# Patient Record
Sex: Female | Born: 1975 | Race: Black or African American | Hispanic: No | Marital: Single | Smoking: Former smoker
Health system: Southern US, Community
[De-identification: ages and names within clinical notes are randomized; demographics above are authoritative.]

## PROBLEM LIST (undated history)

## (undated) DIAGNOSIS — I517 Cardiomegaly: Secondary | ICD-10-CM

## (undated) DIAGNOSIS — N189 Chronic kidney disease, unspecified: Secondary | ICD-10-CM

## (undated) DIAGNOSIS — I639 Cerebral infarction, unspecified: Secondary | ICD-10-CM

## (undated) DIAGNOSIS — J189 Pneumonia, unspecified organism: Secondary | ICD-10-CM

## (undated) DIAGNOSIS — I629 Nontraumatic intracranial hemorrhage, unspecified: Secondary | ICD-10-CM

## (undated) DIAGNOSIS — D649 Anemia, unspecified: Secondary | ICD-10-CM

## (undated) DIAGNOSIS — I10 Essential (primary) hypertension: Secondary | ICD-10-CM

## (undated) DIAGNOSIS — Z992 Dependence on renal dialysis: Secondary | ICD-10-CM

## (undated) DIAGNOSIS — I272 Pulmonary hypertension, unspecified: Secondary | ICD-10-CM

## (undated) DIAGNOSIS — I3139 Other pericardial effusion (noninflammatory): Secondary | ICD-10-CM

## (undated) DIAGNOSIS — N186 End stage renal disease: Secondary | ICD-10-CM

## (undated) DIAGNOSIS — F1910 Other psychoactive substance abuse, uncomplicated: Secondary | ICD-10-CM

## (undated) DIAGNOSIS — I509 Heart failure, unspecified: Secondary | ICD-10-CM

## (undated) DIAGNOSIS — D696 Thrombocytopenia, unspecified: Secondary | ICD-10-CM

## (undated) HISTORY — DX: Other pericardial effusion (noninflammatory): I31.39

## (undated) HISTORY — PX: CARDIAC CATHETERIZATION: SHX172

## (undated) HISTORY — DX: End stage renal disease: N18.6

## (undated) HISTORY — DX: Nontraumatic intracranial hemorrhage, unspecified: I62.9

## (undated) HISTORY — DX: Pulmonary hypertension, unspecified: I27.20

## (undated) HISTORY — DX: End stage renal disease: Z99.2

## (undated) HISTORY — DX: Cardiomegaly: I51.7

## (undated) HISTORY — DX: Thrombocytopenia, unspecified: D69.6

---

## 2014-03-08 DIAGNOSIS — I10 Essential (primary) hypertension: Secondary | ICD-10-CM

## 2015-07-12 ENCOUNTER — Emergency Department (HOSPITAL_COMMUNITY)
Admission: EM | Admit: 2015-07-12 | Discharge: 2015-07-12 | Disposition: A | Discharge status: Home / Self Care | Payer: Medicaid Other | Attending: Emergency Medicine | Admitting: Emergency Medicine

## 2015-07-12 ENCOUNTER — Encounter (HOSPITAL_COMMUNITY): Payer: Self-pay | Admitting: Emergency Medicine

## 2015-07-12 DIAGNOSIS — R0600 Dyspnea, unspecified: Secondary | ICD-10-CM | POA: Insufficient documentation

## 2015-07-12 DIAGNOSIS — I4892 Unspecified atrial flutter: Secondary | ICD-10-CM | POA: Insufficient documentation

## 2015-07-12 DIAGNOSIS — F419 Anxiety disorder, unspecified: Secondary | ICD-10-CM | POA: Diagnosis present

## 2015-07-12 DIAGNOSIS — I10 Essential (primary) hypertension: Secondary | ICD-10-CM | POA: Diagnosis not present

## 2015-07-12 DIAGNOSIS — F17200 Nicotine dependence, unspecified, uncomplicated: Secondary | ICD-10-CM | POA: Insufficient documentation

## 2015-07-12 DIAGNOSIS — R002 Palpitations: Secondary | ICD-10-CM

## 2015-07-12 HISTORY — DX: Essential (primary) hypertension: I10

## 2015-07-12 LAB — BASIC METABOLIC PANEL
Anion gap: 7 (ref 5–15)
BUN: 17 mg/dL (ref 6–20)
CO2: 26 mmol/L (ref 22–32)
Calcium: 9.5 mg/dL (ref 8.9–10.3)
Chloride: 110 mmol/L (ref 101–111)
Creatinine, Ser: 1.18 mg/dL — ABNORMAL HIGH (ref 0.44–1.00)
GFR calc Af Amer: >60 mL/min (ref >60)
GFR calc non Af Amer: 58 mL/min — ABNORMAL LOW (ref >60)
Glucose, Bld: 105 mg/dL — ABNORMAL HIGH (ref 65–99)
Potassium: 3.8 mmol/L (ref 3.5–5.1)
Sodium: 143 mmol/L (ref 135–145)

## 2015-07-12 LAB — CBC WITH DIFFERENTIAL/PLATELET
Basophils Absolute: 0 10*3/uL (ref 0.0–0.1)
Basophils Relative: 1 %
Eosinophils Absolute: 0.2 10*3/uL (ref 0.0–0.7)
Eosinophils Relative: 3 %
HCT: 41.9 % (ref 36.0–46.0)
Hemoglobin: 13.1 g/dL (ref 12.0–15.0)
Lymphocytes Relative: 43 %
Lymphs Abs: 2.7 10*3/uL (ref 0.7–4.0)
MCH: 24.3 pg — ABNORMAL LOW (ref 26.0–34.0)
MCHC: 31.3 g/dL (ref 30.0–36.0)
MCV: 77.6 fL — ABNORMAL LOW (ref 78.0–100.0)
Monocytes Absolute: 0.4 10*3/uL (ref 0.1–1.0)
Monocytes Relative: 6 %
Neutro Abs: 3.1 10*3/uL (ref 1.7–7.7)
Neutrophils Relative %: 49 %
Platelets: 268 10*3/uL (ref 150–400)
RBC: 5.4 MIL/uL — ABNORMAL HIGH (ref 3.87–5.11)
RDW: 16.1 % — ABNORMAL HIGH (ref 11.5–15.5)
WBC: 6.5 10*3/uL (ref 4.0–10.5)

## 2015-07-12 LAB — TSH: TSH: 1.55 u[IU]/mL (ref 0.350–4.500)

## 2015-07-12 LAB — TROPONIN I: Troponin I: <0.03 ng/mL (ref <0.031)

## 2015-07-12 LAB — MAGNESIUM: Magnesium: 2.2 mg/dL (ref 1.7–2.4)

## 2015-07-12 NOTE — ED Notes (Signed)
Bed: WA01 Expected date:  Expected time:  Means of arrival:  Comments: Hold for triage 

## 2015-07-12 NOTE — Discharge Instructions (Signed)
Atrial Flutter Atrial flutter is a heart rhythm that can cause the heart to beat very fast (tachycardia). It originates in the upper chambers of the heart (atria). In atrial flutter, the top chambers of the heart (atria) often beat much faster than the bottom chambers of the heart (ventricles). Atrial flutter has a regular "saw toothed" appearance in an EKG readout. An EKG is a test that records the electrical activity of the heart. Atrial flutter can cause the heart to beat up to 150 beats per minute (BPM). Atrial flutter can either be short lived (paroxysmal) or permanent.  CAUSES  Causes of atrial flutter can be many. Some of these include:  Heart related issues:  Heart attack (myocardial infarction).  Heart failure.  Heart valve problems.  Poorly controlled high blood pressure (hypertension).  After open heart surgery.  Lung related issues:  A blood clot in the lungs (pulmonary embolism).  Chronic obstructive pulmonary disease (COPD). Medications used to treat COPD can attribute to atrial flutter.  Other related causes:  Hyperthyroidism.  Caffeine.  Some decongestant cold medications.  Low electrolyte levels such as potassium or magnesium.  Cocaine. SYMPTOMS  An awareness of your heart beating rapidly (palpitations).  Shortness of breath.  Chest pain.  Low blood pressure (hypotension).  Dizziness or fainting. DIAGNOSIS  Different tests can be performed to diagnose atrial flutter.   An EKG.  Holter monitor. This is a 24-hour recording of your heart rhythm. You will also be given a diary. Write down all symptoms that you have and what you were doing at the time you experienced symptoms.  Cardiac event monitor. This small device can be worn for up to 30 days. When you have heart symptoms, you will push a button on the device. This will then record your heart rhythm.  Echocardiogram. This is an imaging test to look at your heart. Your caregiver will look at your  heart valves and the ventricles.  Stress test. This test can help determine if the atrial flutter is related to exercise or if coronary artery disease is present.  Laboratory studies will look at certain blood levels like:  Complete blood count (CBC).  Potassium.  Magnesium.  Thyroid function. TREATMENT  Treatment of atrial flutter varies. A combination of therapies may be used or sometimes atrial flutter may need only 1 type of treatment.  Lab work: If your blood work, such as your electrolytes (potassium, magnesium) or your thyroid function tests, are abnormal, your caregiver will treat them accordingly.  Medication:  There are several different types of medications that can convert your heart to a normal rhythm and prevent atrial flutter from reoccurring.  Nonsurgical procedures: Nonsurgical techniques may be used to control atrial flutter. Some examples include:  Cardioversion. This technique uses either drugs or an electrical shock to restore a normal heart rhythm:  Cardioversion drugs may be given through an intravenous (IV) line to help "reset" the heart rhythm.  In electrical cardioversion, your caregiver shocks your heart with electrical energy. This helps to reset the heartbeat to a normal rhythm.  Ablation. If atrial flutter is a persistent problem, an ablation may be needed. This procedure is done under mild sedation. High frequency radio-wave energy is used to destroy the area of heart tissue responsible for atrial flutter. SEEK IMMEDIATE MEDICAL CARE IF:  You have:  Dizziness.  Near fainting or fainting.  Shortness of breath.  Chest pain or pressure.  Sudden nausea or vomiting.  Profuse sweating. If you have the above symptoms,  call your local emergency service immediately! Do not drive yourself to the hospital. MAKE SURE YOU:   Understand these instructions.  Palpitations A palpitation is the feeling that your heartbeat is irregular or is faster than  normal. It may feel like your heart is fluttering or skipping a beat. Palpitations are usually not a serious problem. However, in some cases, you may need further medical evaluation. CAUSES  Palpitations can be caused by:  Smoking.  Caffeine or other stimulants, such as diet pills or energy drinks.  Alcohol.  Stress and anxiety.  Strenuous physical activity.  Fatigue.  Certain medicines.  Heart disease, especially if you have a history of irregular heart rhythms (arrhythmias), such as atrial fibrillation, atrial flutter, or supraventricular tachycardia.  An improperly working pacemaker or defibrillator. DIAGNOSIS  To find the cause of your palpitations, your health care provider will take your medical history and perform a physical exam. Your health care provider may also have you take a test called an ambulatory electrocardiogram (ECG). An ECG records your heartbeat patterns over a 24-hour period. You may also have other tests, such as:  Transthoracic echocardiogram (TTE). During echocardiography, sound waves are used to evaluate how blood flows through your heart.  Transesophageal echocardiogram (TEE).  Cardiac monitoring. This allows your health care provider to monitor your heart rate and rhythm in real time.  Holter monitor. This is a portable device that records your heartbeat and can help diagnose heart arrhythmias. It allows your health care provider to track your heart activity for several days, if needed.  Stress tests by exercise or by giving medicine that makes the heart beat faster. TREATMENT  Treatment of palpitations depends on the cause of your symptoms and can vary greatly. Most cases of palpitations do not require any treatment other than time, relaxation, and monitoring your symptoms. Other causes, such as atrial fibrillation, atrial flutter, or supraventricular tachycardia, usually require further treatment. HOME CARE INSTRUCTIONS   Avoid:  Caffeinated  coffee, tea, soft drinks, diet pills, and energy drinks.  Chocolate.  Alcohol.  Stop smoking if you smoke.  Reduce your stress and anxiety. Things that can help you relax include:  A method of controlling things in your body, such as your heartbeats, with your mind (biofeedback).  Yoga.  Meditation.  Physical activity such as swimming, jogging, or walking.  Get plenty of rest and sleep. SEEK MEDICAL CARE IF:   You continue to have a fast or irregular heartbeat beyond 24 hours.  Your palpitations occur more often. SEEK IMMEDIATE MEDICAL CARE IF:  You have chest pain or shortness of breath.  You have a severe headache.  You feel dizzy or you faint. MAKE SURE YOU:  Understand these instructions.  Will watch your condition.  Will get help right away if you are not doing well or get worse.   This information is not intended to replace advice given to you by your health care provider. Make sure you discuss any questions you have with your health care provider.   Document Released: 05/30/2000 Document Revised: 06/07/2013 Document Reviewed: 08/01/2011 Elsevier Interactive Patient Education [employer].

## 2015-07-12 NOTE — ED Notes (Signed)
Per pt, states she was having a panic attack at work today-here for eval-also complaining of SOB

## 2015-07-12 NOTE — ED Provider Notes (Signed)
CSN: [id number]     Arrival date & time 07/12/15  1652 History   First MD Initiated Contact with Patient 07/12/15 1803     Chief Complaint  Patient presents with  . Panic Attack     (Consider location/radiation/quality/duration/timing/severity/associated sxs/prior Treatment) HPI   40yF with "anxiety." Onset shortly before arrival while at work. No obvious precipitant. Mild dyspnea. Denies pain. On my initial evaluation had HR of 140 on monitor while no variation. Denies hx of afib/flutter or svt. Reports similar symptoms previously. She reports maybe 4-5 times in past 3 years. Previous times the symptoms subsided after a few minutes.   Past Medical History  Diagnosis Date  . Hypertension    History reviewed. No pertinent past surgical history. No family history on file. Social History  Substance Use Topics  . Smoking status: Current Every Day Smoker  . Smokeless tobacco: None  . Alcohol Use: Yes   OB History    No data available     Review of Systems  All systems reviewed and negative, other than as noted in HPI.   Allergies  Visine  Home Medications   Prior to Admission medications   Medication Sig Start Date End Date Taking? Authorizing Provider  ibuprofen (ADVIL,MOTRIN) 200 MG tablet Take 400 mg by mouth every 4 (four) hours as needed for moderate pain or cramping.   Yes Historical Provider, MD   BP 183/131 mmHg  Pulse 95  Temp(Src) 98.4 F (36.9 C) (Oral)  Resp 21  SpO2 99%  LMP 06/22/2015 Physical Exam  Constitutional: She appears well-developed and well-nourished. No distress.  HENT:  Head: Normocephalic and atraumatic.  Eyes: Conjunctivae are normal. Right eye exhibits no discharge. Left eye exhibits no discharge.  Neck: Neck supple.  Cardiovascular: Regular rhythm and normal heart sounds.  Exam reveals no gallop and no friction rub.   No murmur heard. Tachy. Regular.  Pulmonary/Chest: Effort normal and breath sounds normal. No respiratory distress.   Abdominal: Soft. She exhibits no distension. There is no tenderness.  Musculoskeletal: She exhibits no edema or tenderness.  Lower extremities symmetric as compared to each other. No calf tenderness. Negative Homan's. No palpable cords.   Neurological: She is alert.  Skin: Skin is warm and dry.  Psychiatric: She has a normal mood and affect. Her behavior is normal. Thought content normal.  Nursing note and vitals reviewed.   ED Course  Procedures (including critical care time) Labs Review Labs Reviewed  CBC WITH DIFFERENTIAL/PLATELET - Abnormal; Notable for the following:    RBC 5.40 (*)    MCV 77.6 (*)    MCH 24.3 (*)    RDW 16.1 (*)    All other components within normal limits  BASIC METABOLIC PANEL - Abnormal; Notable for the following:    Glucose, Bld 105 (*)    Creatinine, Ser 1.18 (*)    GFR calc non Af Amer 58 (*)    All other components within normal limits  TROPONIN I  TSH  MAGNESIUM    Imaging Review No results found. I have personally reviewed and evaluated these images and lab results as part of my medical decision-making.   EKG Interpretation   Date/Time:  Thursday July 12 2015 17:47:16 EST Ventricular Rate:  140 PR Interval:  246 QRS Duration: 69 QT Interval:  291 QTC Calculation: 444 R Axis:   64 Text Interpretation:  Atrial Flutter? with 2:1 A-V conduction No old  tracing to compare Confirmed by [place]  MD, [place] [phone]) on 07/12/2015  8:41:39 PM      MDM   Final diagnoses:  Atrial flutter, unspecified type ([place])    40 year old female with palpitations. Suspect initial EKG may have been atrial flutter with 2-1 conduction. Spontaneous conversion. Resolution of symptoms with this. Patient reports similar symptoms intermittently for the last couple years. Has never had formal evaluation of it. Based workup today is unremarkable. She was noted to be hypertensive though. Reports previous history of hypertension during pregnancy. Is not currently  on medications.   Now in sinus. No complaints. W/u fairly unremarkable. Needs outpt FU. Discussed need to establish primary care and to discuss symptoms and have BP rechecked.    Virgel Manifold, MD 07/18/15 1136

## 2015-07-12 NOTE — ED Notes (Signed)
[place], MD notified of current blood pressure of 184/141. Pt ok'd to be discharged by Wilson Singer, MD.

## 2016-06-17 ENCOUNTER — Encounter (HOSPITAL_COMMUNITY): Payer: Self-pay | Admitting: *Deleted

## 2016-06-17 ENCOUNTER — Ambulatory Visit (HOSPITAL_COMMUNITY)
Admission: EM | Admit: 2016-06-17 | Discharge: 2016-06-17 | Disposition: A | Discharge status: Home / Self Care | Payer: Medicaid Other | Attending: Family Medicine | Admitting: Family Medicine

## 2016-06-17 ENCOUNTER — Emergency Department (HOSPITAL_COMMUNITY)
Admission: AD | Admit: 2016-06-17 | Discharge: 2016-06-17 | Disposition: A | Discharge status: Home / Self Care | Payer: Medicaid Other | Source: Ambulatory Visit | Attending: Emergency Medicine | Admitting: Emergency Medicine

## 2016-06-17 ENCOUNTER — Encounter (HOSPITAL_COMMUNITY): Payer: Self-pay | Admitting: Family Medicine

## 2016-06-17 DIAGNOSIS — Z3201 Encounter for pregnancy test, result positive: Secondary | ICD-10-CM | POA: Diagnosis not present

## 2016-06-17 DIAGNOSIS — Z9119 Patient's noncompliance with other medical treatment and regimen: Secondary | ICD-10-CM | POA: Diagnosis not present

## 2016-06-17 DIAGNOSIS — Z91199 Patient's noncompliance with other medical treatment and regimen due to unspecified reason: Secondary | ICD-10-CM

## 2016-06-17 DIAGNOSIS — K5901 Slow transit constipation: Secondary | ICD-10-CM | POA: Diagnosis not present

## 2016-06-17 DIAGNOSIS — F17210 Nicotine dependence, cigarettes, uncomplicated: Secondary | ICD-10-CM | POA: Insufficient documentation

## 2016-06-17 DIAGNOSIS — O99331 Smoking (tobacco) complicating pregnancy, first trimester: Secondary | ICD-10-CM | POA: Diagnosis not present

## 2016-06-17 DIAGNOSIS — O10011 Pre-existing essential hypertension complicating pregnancy, first trimester: Secondary | ICD-10-CM

## 2016-06-17 DIAGNOSIS — Z720 Tobacco use: Secondary | ICD-10-CM

## 2016-06-17 DIAGNOSIS — O10919 Unspecified pre-existing hypertension complicating pregnancy, unspecified trimester: Secondary | ICD-10-CM

## 2016-06-17 DIAGNOSIS — O0991 Supervision of high risk pregnancy, unspecified, first trimester: Secondary | ICD-10-CM

## 2016-06-17 DIAGNOSIS — Z79899 Other long term (current) drug therapy: Secondary | ICD-10-CM | POA: Diagnosis not present

## 2016-06-17 DIAGNOSIS — Z3A01 Less than 8 weeks gestation of pregnancy: Secondary | ICD-10-CM | POA: Insufficient documentation

## 2016-06-17 DIAGNOSIS — I160 Hypertensive urgency: Secondary | ICD-10-CM

## 2016-06-17 LAB — URINALYSIS, ROUTINE W REFLEX MICROSCOPIC
Bilirubin Urine: NEGATIVE
GLUCOSE, UA: 50 mg/dL — AB
Hgb urine dipstick: NEGATIVE
KETONES UR: NEGATIVE mg/dL
Leukocytes, UA: NEGATIVE
NITRITE: NEGATIVE
PH: 7 (ref 5.0–8.0)
Protein, ur: 30 mg/dL — AB
Specific Gravity, Urine: 1.014 (ref 1.005–1.030)

## 2016-06-17 LAB — POCT URINALYSIS DIP (DEVICE)
BILIRUBIN URINE: NEGATIVE
GLUCOSE, UA: NEGATIVE mg/dL
KETONES UR: NEGATIVE mg/dL
LEUKOCYTES UA: NEGATIVE
Nitrite: NEGATIVE
Protein, ur: 30 mg/dL — AB
SPECIFIC GRAVITY, URINE: 1.025 (ref 1.005–1.030)
UROBILINOGEN UA: 0.2 mg/dL (ref 0.0–1.0)
pH: 6.5 (ref 5.0–8.0)

## 2016-06-17 LAB — CBC WITH DIFFERENTIAL/PLATELET
BASOS PCT: 0 %
Basophils Absolute: 0 10*3/uL (ref 0.0–0.1)
EOS ABS: 0.1 10*3/uL (ref 0.0–0.7)
Eosinophils Relative: 1 %
HEMATOCRIT: 34.6 % — AB (ref 36.0–46.0)
HEMOGLOBIN: 10.4 g/dL — AB (ref 12.0–15.0)
LYMPHS PCT: 22 %
Lymphs Abs: 1.7 10*3/uL (ref 0.7–4.0)
MCH: 20.6 pg — AB (ref 26.0–34.0)
MCHC: 30.1 g/dL (ref 30.0–36.0)
MCV: 68.5 fL — ABNORMAL LOW (ref 78.0–100.0)
MONOS PCT: 5 %
Monocytes Absolute: 0.4 10*3/uL (ref 0.1–1.0)
NEUTROS ABS: 5.4 10*3/uL (ref 1.7–7.7)
Neutrophils Relative %: 72 %
Platelets: 228 10*3/uL (ref 150–400)
RBC: 5.05 MIL/uL (ref 3.87–5.11)
RDW: 17.7 % — ABNORMAL HIGH (ref 11.5–15.5)
WBC: 7.6 10*3/uL (ref 4.0–10.5)

## 2016-06-17 LAB — BASIC METABOLIC PANEL
Anion gap: 9 (ref 5–15)
BUN: 8 mg/dL (ref 6–20)
CHLORIDE: 105 mmol/L (ref 101–111)
CO2: 18 mmol/L — AB (ref 22–32)
CREATININE: 0.72 mg/dL (ref 0.44–1.00)
Calcium: 8.9 mg/dL (ref 8.9–10.3)
GFR calc Af Amer: >60 mL/min (ref >60)
GFR calc non Af Amer: >60 mL/min (ref >60)
GLUCOSE: 88 mg/dL (ref 65–99)
Potassium: 3.4 mmol/L — ABNORMAL LOW (ref 3.5–5.1)
Sodium: 132 mmol/L — ABNORMAL LOW (ref 135–145)

## 2016-06-17 LAB — POCT PREGNANCY, URINE: Preg Test, Ur: POSITIVE — AB

## 2016-06-17 MED ORDER — SODIUM CHLORIDE 0.9 % IV SOLN
INTRAVENOUS | Status: DC
  Administered 2016-06-17: 14:00:00 via INTRAVENOUS

## 2016-06-17 MED ORDER — LABETALOL HCL 200 MG PO TABS
400.0000 mg | ORAL_TABLET | Freq: Once | ORAL | Status: AC
  Administered 2016-06-17: 400 mg via ORAL
  Filled 2016-06-17: qty 2

## 2016-06-17 MED ORDER — LABETALOL HCL 200 MG PO TABS: 400.0000 mg | ORAL_TABLET | Freq: Three times a day (TID) | ORAL | 2 refills | Status: DC

## 2016-06-17 MED ORDER — HYDRALAZINE HCL 20 MG/ML IJ SOLN
10.0000 mg | Freq: Once | INTRAMUSCULAR | Status: AC
  Administered 2016-06-17: 10 mg via INTRAVENOUS
  Filled 2016-06-17: qty 1

## 2016-06-17 NOTE — ED Triage Notes (Signed)
Pt here for abnormal BM and constipation x 2 weeks. sts she is also late on her period and feels very bloated.

## 2016-06-17 NOTE — Discharge Instructions (Signed)
Miralax for constipation. Go to [facility] hospital now.

## 2016-06-17 NOTE — MAU Note (Signed)
Ran out of BP meds about 3 wks ago.  Didn't have money for co-pay for doc and meds.  States has never been this high before. Has been having headaches.

## 2016-06-17 NOTE — Discharge Instructions (Signed)
You are started on new blood pressure medication, labetalol. Please take this medication as prescribed.  Please follow-up with OB, listed above for ongoing management of your pregnancy and blood pressure management.  You can take over the counter Miralax (polyethylene glycol) for your constipation. Develop one capful of taste as powder into any drink or soft food and take once daily.  Return without fail for worsening symptoms, including confusion, new vision or speech changes, vaginal bleeding or severe abdominal pain, chest pain, difficulty breathing, or any other symptoms concerning to you.

## 2016-06-17 NOTE — ED Notes (Signed)
[facility] Per [facility], pt with hx of HTN but has been out of her clonidine x 1 mo. Per EMS, pt went to urgent care to refill her medication and was found to be [redacted] weeks pregnant. 220/130, HR 100, 100% on RA. Per EMS, pt given 10 mg hydralazine IV.

## 2016-06-17 NOTE — ED Provider Notes (Signed)
CSN: [id number]     Arrival date & time 06/17/16  1118 History   First MD Initiated Contact with Patient 06/17/16 1242     Chief Complaint  Patient presents with  . Constipation   (Consider location/radiation/quality/duration/timing/severity/associated sxs/prior Treatment) 41 year old obese female states that she has not had a bowel movement in 7-10 days and feels bloated in the abdomen. Denies abdominal pain. She has taken no OTC medications.  She is also concerned about her blood pressure which is elevated because she has not refilled her medications. She was told that she would have to see her PCP however the patient states she did not have $3 co-pay to see her doctor.  Denies chest pain, shortness of breath, edema or neurologic symptoms.      Past Medical History:  Diagnosis Date  . Hypertension    History reviewed. No pertinent surgical history. History reviewed. No pertinent family history. Social History  Substance Use Topics  . Smoking status: Current Every Day Smoker  . Smokeless tobacco: Never Used  . Alcohol use Yes   OB History    No data available     Review of Systems  Constitutional: Negative.   HENT: Negative.   Respiratory: Negative for cough and shortness of breath.   Cardiovascular: Negative for chest pain and leg swelling.  Gastrointestinal: Positive for constipation. Negative for abdominal pain.  Genitourinary: Negative.   Musculoskeletal: Negative.   Skin: Negative.   Neurological: Negative for seizures, syncope, speech difficulty, numbness and headaches.  All other systems reviewed and are negative.   Allergies  Visine [tetrahydrozoline hcl]  Home Medications   Prior to Admission medications   Medication Sig Start Date End Date Taking? Authorizing Provider  cloNIDine (CATAPRES) 0.2 MG tablet Take 0.2 mg by mouth 2 (two) times daily.   Yes Historical Provider, MD  metoprolol succinate (TOPROL-XL) 50 MG 24 hr tablet Take 50 mg by mouth daily.  Take with or immediately following a meal.   Yes Historical Provider, MD  ibuprofen (ADVIL,MOTRIN) 200 MG tablet Take 400 mg by mouth every 4 (four) hours as needed for moderate pain or cramping.    Historical Provider, MD   Meds Ordered and Administered this Visit  Medications - No data to display  BP (!) 220/136   Pulse 91   Temp 98.1 F (36.7 C)   Resp 18   LMP 04/30/2016   SpO2 100%  No data found.   Physical Exam  Urgent Care Course   Clinical Course     Procedures (including critical care time)  Labs Review Labs Reviewed  POCT URINALYSIS DIP (DEVICE) - Abnormal; Notable for the following:       Result Value   Hgb urine dipstick TRACE (*)    Protein, ur 30 (*)    All other components within normal limits  POCT PREGNANCY, URINE - Abnormal; Notable for the following:    Preg Test, Ur POSITIVE (*)    All other components within normal limits    Imaging Review No results found.   Visual Acuity Review  Right Eye Distance:   Left Eye Distance:   Bilateral Distance:    Right Eye Near:   Left Eye Near:    Bilateral Near:         MDM   1. Slow transit constipation   2. Positive urine pregnancy test   3. Pre-existing essential hypertension during pregnancy in first trimester   4. High-risk pregnancy in first trimester   5. Tobacco abuse  disorder   6. Personal history of noncompliance with medical treatment    To go directly to [facility] for evaluation now because of positive pregnancy to test, first trimester, high risk pregnancy, hypertension and medical noncompliance. Her chief complaint was constipation.    Janne Napoleon, NP 06/17/16 1324

## 2016-06-17 NOTE — MAU Provider Note (Cosign Needed)
History     CSN: [id number]  Arrival date and time: 06/17/16 1346   None     Chief Complaint  Patient presents with  . Hypertension   HPI  [name] is a 41 y.o. female [phone] @ [redacted]w[redacted]d here in MAU with concerns about her BP. She was seen at the Urgent Care today at [facility] and was noted to have severely elevated BP. The patient says she ran out of her BP medication 3 weeks ago and has not been able to afford the co-pay to have it refilled. The patient was sent here to [facility] for further management.  She is without chest pain.   She denies vaginal bleeding or abdominal pain.   OB History    Gravida Para Term Preterm AB Living   8 5 5   2 5    SAB TAB Ectopic Multiple Live Births     2     5      Past Medical History:  Diagnosis Date  . Hypertension     No past surgical history on file.  No family history on file.  Social History  Substance Use Topics  . Smoking status: Current Every Day Smoker  . Smokeless tobacco: Never Used  . Alcohol use Yes    Allergies:  Allergies  Allergen Reactions  . Visine [Tetrahydrozoline Hcl] Other (See Comments)    Eyes Swelling.    Prescriptions Prior to Admission  Medication Sig Dispense Refill Last Dose  . cloNIDine (CATAPRES) 0.2 MG tablet Take 0.2 mg by mouth 2 (two) times daily.     Marland Kitchen ibuprofen (ADVIL,MOTRIN) 200 MG tablet Take 400 mg by mouth every 4 (four) hours as needed for moderate pain or cramping.   Past Month at Unknown time  . metoprolol succinate (TOPROL-XL) 50 MG 24 hr tablet Take 50 mg by mouth daily. Take with or immediately following a meal.      Results for orders placed or performed during the hospital encounter of 06/17/16 (from the past 48 hour(s))  Urinalysis, Routine w reflex microscopic     Status: Abnormal   Collection Time: 06/17/16  2:53 PM  Result Value Ref Range   Color, Urine YELLOW YELLOW   APPearance CLEAR CLEAR   Specific Gravity, Urine 1.014 1.005 - 1.030   pH 7.0 5.0 - 8.0   Glucose,  UA 50 (A) NEGATIVE mg/dL   Hgb urine dipstick NEGATIVE NEGATIVE   Bilirubin Urine NEGATIVE NEGATIVE   Ketones, ur NEGATIVE NEGATIVE mg/dL   Protein, ur 30 (A) NEGATIVE mg/dL   Nitrite NEGATIVE NEGATIVE   Leukocytes, UA NEGATIVE NEGATIVE   RBC / HPF 0-5 0 - 5 RBC/hpf   WBC, UA 0-5 0 - 5 WBC/hpf   Bacteria, UA RARE (A) NONE SEEN   Squamous Epithelial / LPF 0-5 (A) NONE SEEN   Mucous PRESENT    Hyaline Casts, UA PRESENT    Review of Systems  Constitutional: Negative for chills and fever.  Cardiovascular: Negative for chest pain.  Gastrointestinal: Negative for abdominal pain.  Neurological: Positive for headaches.   Physical Exam   Blood pressure (!) 224/151, pulse 97, temperature 98.5 F (36.9 C), temperature source Oral, resp. rate 18, height 5' 1.25" (1.556 m), weight 180 lb 6.4 oz (81.8 kg), last menstrual period 05/03/2016, SpO2 100 %.  Physical Exam  Constitutional: She is oriented to person, place, and time. She appears well-developed and well-nourished. No distress.  HENT:  Head: Normocephalic.  Cardiovascular: Tachycardia present.   Musculoskeletal: Normal  range of motion.  Neurological: She is alert and oriented to person, place, and time.  Skin: Skin is warm. She is not diaphoretic.  Psychiatric: Her behavior is normal.   MAU Course  Procedures  None  MDM  Patient sent from Urgent care with severely elevated BP readings. Discussed patient with [name] Dr. Lorretta Harp. Will transfer patient via carelink to [name] for treatment of BP. Patient is without pregnancy complaints. Referral message sent to the [facility] for scheduling of high risk OB appointment.  - NS  - Hydralazine 10 mg IV X 1 dose.   Assessment and Plan   A:  1. Hypertensive crisis, unspecified   2. Chronic hypertension affecting pregnancy     P:  Transfer to [name] via [place], NP 06/17/2016 4:20 PM

## 2016-06-17 NOTE — ED Notes (Signed)
ED Provider at bedside. 

## 2016-06-17 NOTE — ED Notes (Signed)
Case management at bedside.

## 2016-06-17 NOTE — ED Provider Notes (Signed)
[place] DEPT Provider Note   CSN: [id number] Arrival date & time: 06/17/16  1346     History   Chief Complaint Chief Complaint  Patient presents with  . Hypertension    HPI Madeline Brown is a 41 y.o. female.  HPI 41 year old female who presents with elevated BPs. History of HTN and obesity. Reports that she normally takes clonidine for her hypertension, but ran out of this medication 3-5 weeks ago. Has had some mild intermittent headaches since that time. Was unable to refill her medications due to inability to afford co-pay to see her primary care doctor in 4 refill on her prescription. States that over the past 2 weeks she has been more constipated, straining more to have a bowel movement. Last bowel movement yesterday. Without abdominal pain, nausea or vomiting, fevers or chills. Was seen at urgent care today for constipation and found to be incidentally pregnant. Was subsequently sent to [facility]. Likely about 6 weeks and 3 days gestational age. No vaginal bleeding or abdominal pain. She was subsequently sent from [facility] to [facility] ED for evaluation of her elevated blood pressure 200s over 100s. No confusion, vomiting, focal numbness or weakness, vision or speech changes, chest pain, difficulty breathing, decreased urine.   Past Medical History:  Diagnosis Date  . Hypertension     There are no active problems to display for this patient.   Past Surgical History:  Procedure Laterality Date  . CESAREAN SECTION     x2    OB History    Gravida Para Term Preterm AB Living   8 5 5   2 5    SAB TAB Ectopic Multiple Live Births     2     5       Home Medications    Prior to Admission medications   Medication Sig Start Date End Date Taking? Authorizing Provider  Aspirin-Acetaminophen-Caffeine (GOODY HEADACHE PO) Take 1 packet by mouth daily as needed. For cramping   Yes Historical Provider, MD  cloNIDine (CATAPRES) 0.2 MG tablet Take 0.2 mg by  mouth 2 (two) times daily.    Historical Provider, MD  labetalol (NORMODYNE) 200 MG tablet Take 2 tablets (400 mg total) by mouth 3 (three) times daily. 06/17/16   Forde Dandy, MD    Family History Family History  Problem Relation Age of Onset  . Diabetes Father   . Hypertension Father   . Cancer Father     Social History Social History  Substance Use Topics  . Smoking status: Current Every Day Smoker    Packs/day: 0.25    Years: 15.00    Types: Cigarettes  . Smokeless tobacco: Never Used  . Alcohol use Yes     Comment: occassional beer     Allergies   Visine [tetrahydrozoline hcl]   Review of Systems Review of Systems 10/14 systems reviewed and are negative other than those stated in the HPI   Physical Exam Updated Vital Signs BP (!) 166/102   Pulse 96   Temp 99 F (37.2 C) (Oral)   Resp 18   Ht 5' 1.25" (1.556 m)   Wt 180 lb 6.4 oz (81.8 kg)   LMP 05/03/2016   SpO2 99%   BMI 33.81 kg/m   Physical Exam Physical Exam  Nursing note and vitals reviewed. Constitutional: Well developed, well nourished, non-toxic, and in no acute distress Head: Normocephalic and atraumatic.  Mouth/Throat: Oropharynx is clear and moist.  Neck: Normal range of motion.  Neck supple.  Cardiovascular: Normal rate and regular rhythm.   Pulmonary/Chest: Effort normal and breath sounds normal.  Abdominal: Soft. Mildly distended. There is no tenderness. There is no rebound and no guarding.  Musculoskeletal: Normal range of motion.  Neurological: Alert, no facial droop, fluent speech, moves all extremities symmetrically Skin: Skin is warm and dry.  Psychiatric: Cooperative   ED Treatments / Results  Labs (all labs ordered are listed, but only abnormal results are displayed) Labs Reviewed  URINALYSIS, ROUTINE W REFLEX MICROSCOPIC - Abnormal; Notable for the following:       Result Value   Glucose, UA 50 (*)    Protein, ur 30 (*)    Bacteria, UA RARE (*)    Squamous Epithelial /  LPF 0-5 (*)    All other components within normal limits  CBC WITH DIFFERENTIAL/PLATELET - Abnormal; Notable for the following:    Hemoglobin 10.4 (*)    HCT 34.6 (*)    MCV 68.5 (*)    MCH 20.6 (*)    RDW 17.7 (*)    All other components within normal limits  BASIC METABOLIC PANEL - Abnormal; Notable for the following:    Sodium 132 (*)    Potassium 3.4 (*)    CO2 18 (*)    All other components within normal limits  URINE CULTURE    EKG  EKG Interpretation None       Radiology No results found.  Procedures Procedures (including critical care time)  Medications Ordered in ED Medications  0.9 %  sodium chloride infusion ( Intravenous New Bag/Given 06/17/16 1420)  hydrALAZINE (APRESOLINE) injection 10 mg (10 mg Intravenous Given 06/17/16 1427)  labetalol (NORMODYNE) tablet 400 mg (400 mg Oral Given 06/17/16 1618)     Initial Impression / Assessment and Plan / ED Course  I have reviewed the triage vital signs and the nursing notes.  Pertinent labs & imaging results that were available during my care of the patient were reviewed by me and considered in my medical decision making (see chart for details).  Clinical Course     41 year old female who presents with elevated blood pressures and incidental first trimester pregnancy at 6 weeks and 3 days. Is hypertensive with systolic blood pressures in the 190s to 200s over 110s. Currently she is asymptomatic. No abdominal pain and no vaginal bleeding. Discussed with OB on-call physician at [facility] discontinuation of her clonidine, and starting labetalol 400 mg 3 times a day and they will continue to monitor and manage her blood pressures as an outpatient in the [facility]. First dose is initiated here in the emergency department and on follow-up blood pressure 166/102.  Case management has evaluated patient regarding prescription management and payment options. Referral given to women's clinic. Strict return  and follow-up instructions reviewed. She expressed understanding of all discharge instructions and felt comfortable with the plan of care.   Final Clinical Impressions(s) / ED Diagnoses   Final diagnoses:  Chronic hypertension affecting pregnancy  Hypertensive urgency    New Prescriptions New Prescriptions   LABETALOL (NORMODYNE) 200 MG TABLET    Take 2 tablets (400 mg total) by mouth 3 (three) times daily.     Forde Dandy, MD 06/17/16 [phone]

## 2016-07-02 ENCOUNTER — Encounter: Payer: Medicaid Other | Admitting: Family Medicine

## 2016-07-07 ENCOUNTER — Encounter: Payer: Self-pay | Admitting: Obstetrics and Gynecology

## 2016-07-07 ENCOUNTER — Encounter: Payer: Medicaid Other | Admitting: Obstetrics and Gynecology

## 2016-07-07 NOTE — Progress Notes (Signed)
Patient did not keep New OB Referral appointment for 07/07/2016.  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

## 2017-02-12 ENCOUNTER — Emergency Department (HOSPITAL_COMMUNITY): Payer: Medicaid Other

## 2017-02-12 ENCOUNTER — Inpatient Hospital Stay (HOSPITAL_COMMUNITY)
Admission: EM | Admit: 2017-02-12 | Discharge: 2017-02-19 | DRG: 682 | Disposition: A | Discharge status: Home / Self Care | Payer: Medicaid Other | Attending: Family Medicine | Admitting: Family Medicine

## 2017-02-12 ENCOUNTER — Encounter (HOSPITAL_COMMUNITY): Payer: Self-pay | Admitting: Emergency Medicine

## 2017-02-12 ENCOUNTER — Inpatient Hospital Stay (HOSPITAL_COMMUNITY): Payer: Medicaid Other

## 2017-02-12 DIAGNOSIS — N200 Calculus of kidney: Secondary | ICD-10-CM | POA: Diagnosis present

## 2017-02-12 DIAGNOSIS — Z7982 Long term (current) use of aspirin: Secondary | ICD-10-CM | POA: Diagnosis not present

## 2017-02-12 DIAGNOSIS — E871 Hypo-osmolality and hyponatremia: Secondary | ICD-10-CM

## 2017-02-12 DIAGNOSIS — M311 Thrombotic microangiopathy: Secondary | ICD-10-CM | POA: Diagnosis present

## 2017-02-12 DIAGNOSIS — N179 Acute kidney failure, unspecified: Secondary | ICD-10-CM | POA: Diagnosis present

## 2017-02-12 DIAGNOSIS — R001 Bradycardia, unspecified: Secondary | ICD-10-CM | POA: Diagnosis not present

## 2017-02-12 DIAGNOSIS — D259 Leiomyoma of uterus, unspecified: Secondary | ICD-10-CM | POA: Diagnosis present

## 2017-02-12 DIAGNOSIS — N390 Urinary tract infection, site not specified: Secondary | ICD-10-CM | POA: Diagnosis present

## 2017-02-12 DIAGNOSIS — F17210 Nicotine dependence, cigarettes, uncomplicated: Secondary | ICD-10-CM | POA: Diagnosis present

## 2017-02-12 DIAGNOSIS — E876 Hypokalemia: Secondary | ICD-10-CM

## 2017-02-12 DIAGNOSIS — R000 Tachycardia, unspecified: Secondary | ICD-10-CM

## 2017-02-12 DIAGNOSIS — I340 Nonrheumatic mitral (valve) insufficiency: Secondary | ICD-10-CM | POA: Diagnosis not present

## 2017-02-12 DIAGNOSIS — E8770 Fluid overload, unspecified: Secondary | ICD-10-CM | POA: Diagnosis not present

## 2017-02-12 DIAGNOSIS — R109 Unspecified abdominal pain: Secondary | ICD-10-CM | POA: Diagnosis not present

## 2017-02-12 DIAGNOSIS — D638 Anemia in other chronic diseases classified elsewhere: Secondary | ICD-10-CM | POA: Diagnosis present

## 2017-02-12 DIAGNOSIS — R1013 Epigastric pain: Secondary | ICD-10-CM | POA: Diagnosis present

## 2017-02-12 DIAGNOSIS — I10 Essential (primary) hypertension: Secondary | ICD-10-CM | POA: Diagnosis present

## 2017-02-12 DIAGNOSIS — D649 Anemia, unspecified: Secondary | ICD-10-CM

## 2017-02-12 DIAGNOSIS — D631 Anemia in chronic kidney disease: Secondary | ICD-10-CM

## 2017-02-12 DIAGNOSIS — R0602 Shortness of breath: Secondary | ICD-10-CM

## 2017-02-12 DIAGNOSIS — D696 Thrombocytopenia, unspecified: Secondary | ICD-10-CM | POA: Diagnosis present

## 2017-02-12 DIAGNOSIS — D594 Other nonautoimmune hemolytic anemias: Secondary | ICD-10-CM | POA: Diagnosis present

## 2017-02-12 DIAGNOSIS — Z888 Allergy status to other drugs, medicaments and biological substances status: Secondary | ICD-10-CM

## 2017-02-12 DIAGNOSIS — I248 Other forms of acute ischemic heart disease: Secondary | ICD-10-CM | POA: Diagnosis present

## 2017-02-12 DIAGNOSIS — Z09 Encounter for follow-up examination after completed treatment for conditions other than malignant neoplasm: Secondary | ICD-10-CM

## 2017-02-12 DIAGNOSIS — I161 Hypertensive emergency: Secondary | ICD-10-CM | POA: Diagnosis present

## 2017-02-12 DIAGNOSIS — D509 Iron deficiency anemia, unspecified: Secondary | ICD-10-CM | POA: Diagnosis present

## 2017-02-12 DIAGNOSIS — R778 Other specified abnormalities of plasma proteins: Secondary | ICD-10-CM

## 2017-02-12 DIAGNOSIS — M8580 Other specified disorders of bone density and structure, unspecified site: Secondary | ICD-10-CM | POA: Diagnosis present

## 2017-02-12 DIAGNOSIS — Z8249 Family history of ischemic heart disease and other diseases of the circulatory system: Secondary | ICD-10-CM | POA: Diagnosis not present

## 2017-02-12 DIAGNOSIS — K760 Fatty (change of) liver, not elsewhere classified: Secondary | ICD-10-CM | POA: Diagnosis present

## 2017-02-12 DIAGNOSIS — R7989 Other specified abnormal findings of blood chemistry: Secondary | ICD-10-CM

## 2017-02-12 DIAGNOSIS — R748 Abnormal levels of other serum enzymes: Secondary | ICD-10-CM | POA: Diagnosis not present

## 2017-02-12 LAB — BASIC METABOLIC PANEL
Anion gap: 12 (ref 5–15)
BUN: 32 mg/dL — AB (ref 6–20)
CALCIUM: 9.2 mg/dL (ref 8.9–10.3)
CO2: 23 mmol/L (ref 22–32)
CREATININE: 2.7 mg/dL — AB (ref 0.44–1.00)
Chloride: 99 mmol/L — ABNORMAL LOW (ref 101–111)
GFR calc Af Amer: 24 mL/min — ABNORMAL LOW (ref >60)
GFR, EST NON AFRICAN AMERICAN: 21 mL/min — AB (ref >60)
Glucose, Bld: 118 mg/dL — ABNORMAL HIGH (ref 65–99)
Potassium: 2.3 mmol/L — CL (ref 3.5–5.1)
SODIUM: 134 mmol/L — AB (ref 135–145)

## 2017-02-12 LAB — CBC
HCT: 32.7 % — ABNORMAL LOW (ref 36.0–46.0)
Hemoglobin: 9.7 g/dL — ABNORMAL LOW (ref 12.0–15.0)
MCH: 21.5 pg — ABNORMAL LOW (ref 26.0–34.0)
MCHC: 29.7 g/dL — ABNORMAL LOW (ref 30.0–36.0)
MCV: 72.5 fL — ABNORMAL LOW (ref 78.0–100.0)
PLATELETS: 68 10*3/uL — AB (ref 150–400)
RBC: 4.51 MIL/uL (ref 3.87–5.11)
RDW: 21 % — AB (ref 11.5–15.5)
WBC: 8.8 10*3/uL (ref 4.0–10.5)

## 2017-02-12 LAB — HEPATIC FUNCTION PANEL
ALT: 16 U/L (ref 14–54)
AST: 38 U/L (ref 15–41)
Albumin: 3.6 g/dL (ref 3.5–5.0)
Alkaline Phosphatase: 53 U/L (ref 38–126)
BILIRUBIN DIRECT: 0.2 mg/dL (ref 0.1–0.5)
BILIRUBIN INDIRECT: 1.1 mg/dL — AB (ref 0.3–0.9)
Total Bilirubin: 1.3 mg/dL — ABNORMAL HIGH (ref 0.3–1.2)
Total Protein: 7.3 g/dL (ref 6.5–8.1)

## 2017-02-12 LAB — POCT I-STAT TROPONIN I
TROPONIN I, POC: 0.44 ng/mL — AB (ref 0.00–0.08)
TROPONIN I, POC: 0.38 ng/mL — AB (ref 0.00–0.08)

## 2017-02-12 LAB — LIPASE, BLOOD: Lipase: 39 U/L (ref 11–51)

## 2017-02-12 LAB — CG4 I-STAT (LACTIC ACID): LACTIC ACID, VENOUS: 1.13 mmol/L (ref 0.5–1.9)

## 2017-02-12 MED ORDER — NICARDIPINE HCL IN NACL 20-0.86 MG/200ML-% IV SOLN
3.0000 mg/h | Freq: Once | INTRAVENOUS | Status: AC
  Administered 2017-02-12: 5 mg/h via INTRAVENOUS
  Filled 2017-02-12: qty 200

## 2017-02-12 MED ORDER — MORPHINE SULFATE (PF) 4 MG/ML IV SOLN
4.0000 mg | INTRAVENOUS | Status: AC | PRN
  Administered 2017-02-12 – 2017-02-14 (×3): 4 mg via INTRAVENOUS
  Filled 2017-02-12 (×3): qty 1

## 2017-02-12 MED ORDER — SODIUM CHLORIDE 0.9 % IV SOLN
Freq: Once | INTRAVENOUS | Status: AC
  Administered 2017-02-12: 23:00:00 via INTRAVENOUS

## 2017-02-12 MED ORDER — ONDANSETRON HCL 4 MG/2ML IJ SOLN
4.0000 mg | Freq: Once | INTRAMUSCULAR | Status: AC
  Administered 2017-02-12: 4 mg via INTRAVENOUS
  Filled 2017-02-12: qty 2

## 2017-02-12 MED ORDER — NICARDIPINE HCL IN NACL 20-0.86 MG/200ML-% IV SOLN
3.0000 mg/h | Freq: Once | INTRAVENOUS | Status: AC
  Administered 2017-02-13: 5 mg/h via INTRAVENOUS
  Filled 2017-02-12: qty 200

## 2017-02-12 MED ORDER — POTASSIUM CHLORIDE 10 MEQ/100ML IV SOLN
10.0000 meq | INTRAVENOUS | Status: AC
  Administered 2017-02-12 – 2017-02-13 (×2): 10 meq via INTRAVENOUS
  Filled 2017-02-12: qty 100

## 2017-02-12 MED ORDER — POTASSIUM CHLORIDE CRYS ER 20 MEQ PO TBCR
40.0000 meq | EXTENDED_RELEASE_TABLET | Freq: Once | ORAL | Status: AC
  Administered 2017-02-13: 40 meq via ORAL
  Filled 2017-02-12: qty 2

## 2017-02-12 NOTE — ED Provider Notes (Signed)
[place] DEPT Provider Note   CSN: [id number] Arrival date & time: 02/12/17  1823     History   Chief Complaint Chief Complaint  Patient presents with  . Abdominal Pain  . Chest Pain    HPI Madeline Brown is a 41 y.o. female.chief complaint is epigastric abdominal pain  HPI 41 year old female. History of hypertension. Off meds for several months. Presents today with 3 days of epigastric abdominal pain. States it goes up and down between her epigastrium and her umbilicus. No frank chest pain. States she has some discomfort by her scapula. No weakness in extremities. No loss of feeling or use of that extremity even temporarily.  History of gallbladder disease. History of pancreatic. Denies alcohol. Denies drug use. Daily smoker. States marijuana. Denies amphetamines and cocaine.  Past Medical History:  Diagnosis Date  . Hypertension     Patient Active Problem List   Diagnosis Date Noted  . ARF (acute renal failure) ([place]) 02/12/2017  . Anemia 02/12/2017  . Hypokalemia 02/12/2017  . Elevated troponin 02/12/2017  . Hyponatremia 02/12/2017  . Tachycardia 02/12/2017    Past Surgical History:  Procedure Laterality Date  . CESAREAN SECTION     x2    OB History    Gravida Para Term Preterm AB Living   8 5 5   2 5    SAB TAB Ectopic Multiple Live Births     2     5       Home Medications    Prior to Admission medications   Medication Sig Start Date End Date Taking? Authorizing Provider  Aspirin-Acetaminophen-Caffeine (GOODY HEADACHE PO) Take 1 packet by mouth daily as needed. For cramping   Yes [provider]  labetalol (NORMODYNE) 200 MG tablet Take 2 tablets (400 mg total) by mouth 3 (three) times daily. Patient not taking: Reported on 02/12/2017 06/17/16   Forde Dandy, MD    Family History Family History  Problem Relation Age of Onset  . Diabetes Father   . Hypertension Father   . Cancer Father     Social History Social History  Substance  Use Topics  . Smoking status: Current Every Day Smoker    Packs/day: 0.25    Years: 15.00    Types: Cigarettes  . Smokeless tobacco: Never Used  . Alcohol use Yes     Comment: occassional beer     Allergies   Visine [tetrahydrozoline hcl]   Review of Systems Review of Systems  Constitutional: Positive for appetite change. Negative for chills, diaphoresis, fatigue and fever.  HENT: Negative for mouth sores, sore throat and trouble swallowing.   Eyes: Negative for visual disturbance.  Respiratory: Negative for cough, chest tightness, shortness of breath and wheezing.   Cardiovascular: Positive for chest pain.  Gastrointestinal: Negative for abdominal distention, abdominal pain, diarrhea, nausea and vomiting.  Endocrine: Negative for polydipsia, polyphagia and polyuria.  Genitourinary: Negative for dysuria, frequency and hematuria.  Musculoskeletal: Negative for gait problem.  Skin: Negative for color change, pallor and rash.  Neurological: Negative for dizziness, syncope, light-headedness and headaches.  Hematological: Does not bruise/bleed easily.  Psychiatric/Behavioral: Negative for behavioral problems and confusion.     Physical Exam Updated Vital Signs BP (!) 152/98   Pulse (!) 110   Temp 98.5 F (36.9 C) (Oral)   Resp (!) 24   Ht 5' (1.524 m)   Wt 81.6 kg (180 lb)   LMP 02/10/2017   SpO2 97%   Breastfeeding? Unknown   BMI 35.15 kg/m  Physical Exam  Constitutional: She is oriented to person, place, and time. She appears well-developed and well-nourished. No distress.  HENT:  Head: Normocephalic.  Eyes: Pupils are equal, round, and reactive to light. Conjunctivae are normal. No scleral icterus.  Neck: Normal range of motion. Neck supple. No thyromegaly present.  Cardiovascular: Normal rate and regular rhythm.  Exam reveals no gallop and no friction rub.   No murmur heard. No murmur  Pulmonary/Chest: Effort normal and breath sounds normal. No respiratory  distress. She has no wheezes. She has no rales.  Abdominal: Soft. Bowel sounds are normal. She exhibits no distension. There is no tenderness. There is no rebound.  Musculoskeletal: Normal range of motion.  Neurological: She is alert and oriented to person, place, and time.  No pulse deficits. No focal neurological deficits.  Skin: Skin is warm and dry. No rash noted.  Psychiatric: She has a normal mood and affect. Her behavior is normal.  no peripheral edema.   ED Treatments / Results  Labs (all labs ordered are listed, but only abnormal results are displayed) Labs Reviewed  BASIC METABOLIC PANEL - Abnormal; Notable for the following:       Result Value   Sodium 134 (*)    Potassium 2.3 (*)    Chloride 99 (*)    Glucose, Bld 118 (*)    BUN 32 (*)    Creatinine, Ser 2.70 (*)    GFR calc non Af Amer 21 (*)    GFR calc Af Amer 24 (*)    All other components within normal limits  CBC - Abnormal; Notable for the following:    Hemoglobin 9.7 (*)    HCT 32.7 (*)    MCV 72.5 (*)    MCH 21.5 (*)    MCHC 29.7 (*)    RDW 21.0 (*)    Platelets 68 (*)    All other components within normal limits  HEPATIC FUNCTION PANEL - Abnormal; Notable for the following:    Total Bilirubin 1.3 (*)    Indirect Bilirubin 1.1 (*)    All other components within normal limits  POCT I-STAT TROPONIN I - Abnormal; Notable for the following:    Troponin i, poc 0.44 (*)    All other components within normal limits  POCT I-STAT TROPONIN I - Abnormal; Notable for the following:    Troponin i, poc 0.38 (*)    All other components within normal limits  LIPASE, BLOOD  URINALYSIS, ROUTINE W REFLEX MICROSCOPIC  I-STAT TROPONIN, ED  I-STAT CG4 LACTIC ACID, ED  I-STAT TROPONIN, ED  CG4 I-STAT (LACTIC ACID)  I-STAT TROPONIN, ED    EKG  EKG Interpretation  Date/Time:  Thursday February 12 2017 18:46:12 EDT Ventricular Rate:  115 PR Interval:    QRS Duration: 87 QT Interval:  345 QTC Calculation: 478 R  Axis:   -15 Text Interpretation:  Sinus or ectopic atrial tachycardia No ST Changes Slow R wave--Unchanged. Confirmed by Tanna Furry [phone]) on 02/12/2017 7:46:19 PM       Radiology Ct Abdomen Pelvis Wo Contrast  Result Date: 02/12/2017 CLINICAL DATA:  Abdominal pain, tender to palpation. Chest pressure for 4 days. Evaluate potential pleural effusion or pleurisy. History of hypertension. EXAM: CT CHEST, ABDOMEN AND PELVIS WITHOUT CONTRAST TECHNIQUE: Multidetector CT imaging of the chest, abdomen and pelvis was performed following the standard protocol without IV contrast. COMPARISON:  None. FINDINGS: CT CHEST FINDINGS CARDIOVASCULAR: The heart is mildly enlarged. Small pericardial effusion. MEDIASTINUM/NODES: No mediastinal mass. No  lymphadenopathy by CT size criteria limited assessment without contrast. Subcentimeter anterior mediastinal lymph node. Thoracic aorta is normal course and caliber. Normal appearance of thoracic esophagus though not tailored for evaluation. LUNGS/PLEURA: Tracheobronchial tree is patent, no pneumothorax. No pleural effusions, focal consolidations, pulmonary nodules or masses. Lingular atelectasis. Minimal dependent atelectasis. MUSCULOSKELETAL: Included soft tissues and included osseous structures appear normal. CT ABDOMEN PELVIS FINDINGS HEPATOBILIARY: Dense gallbladder sludge with mild suspected wall thickening. No biliary dilatation. Minimal focal fatty infiltration about the falciform ligament, liver is otherwise unremarkable. PANCREAS: Normal. SPLEEN: Normal. ADRENALS/URINARY TRACT: Kidneys are orthotopic, demonstrating normal size and morphology. 4 mm RIGHT lower pole nephrolithiasis. Hydronephrosis; limited assessment for renal masses on this nonenhanced examination. The unopacified ureters are normal in course and caliber. Urinary bladder is partially distended and unremarkable. Normal adrenal glands. STOMACH/BOWEL: The stomach, small and large bowel are normal in course  and caliber without inflammatory changes, sensitivity decreased by lack of enteric contrast. Mild amount of retained large bowel stool. Mild colonic diverticulosis. Normal appendix. VASCULAR/LYMPHATIC: Aortoiliac vessels are normal in course and caliber. Mild calcific atherosclerosis. No lymphadenopathy by CT size criteria. REPRODUCTIVE: Enlarged homogeneous uterus with lobulated contour. OTHER: No intraperitoneal free fluid or free air. MUSCULOSKELETAL: Non-acute. Small fat containing inguinal hernias. Small fat containing umbilical hernia. Anterior pelvic wall scarring. Mild S-type scoliosis. IMPRESSION: CT CHEST: 1.  Mild cardiomegaly and small pericardial effusion. 2. No acute pulmonary process. CT ABDOMEN AND PELVIS: 1. Gallbladder sludge and possible cholecystitis though, ultrasound would be more sensitive. 2. Nonobstructing 4 mm RIGHT nephrolithiasis. 3. 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 Electronically Signed   By: Elon Alas M.D.   On: 02/12/2017 22:08   Dg Chest 2 View  Result Date: 02/12/2017 CLINICAL DATA:  Five day history of chest pressure. Current smoker. Current history of hypertension. EXAM: CHEST  2 VIEW COMPARISON:  None. FINDINGS: Cardiac silhouette moderately to markedly enlarged. Thoracic aorta mildly tortuous. Hilar and mediastinal contours otherwise unremarkable. Lungs clear. Bronchovascular markings normal. Pulmonary vascularity normal. No visible pleural effusions. No pneumothorax. Mild thoracic dextroscoliosis and thoracolumbar levoscoliosis. IMPRESSION: 1.  No acute cardiopulmonary disease. 2. Moderate to marked cardiomegaly. Electronically Signed   By: Evangeline Dakin M.D.   On: 02/12/2017 19:10   Ct Chest Wo Contrast  Result Date: 02/12/2017 CLINICAL DATA:  Abdominal pain, tender to palpation. Chest pressure for 4 days. Evaluate potential pleural effusion or pleurisy. History of hypertension. EXAM: CT CHEST, ABDOMEN AND PELVIS WITHOUT CONTRAST TECHNIQUE:  Multidetector CT imaging of the chest, abdomen and pelvis was performed following the standard protocol without IV contrast. COMPARISON:  None. FINDINGS: CT CHEST FINDINGS CARDIOVASCULAR: The heart is mildly enlarged. Small pericardial effusion. MEDIASTINUM/NODES: No mediastinal mass. No lymphadenopathy by CT size criteria limited assessment without contrast. Subcentimeter anterior mediastinal lymph node. Thoracic aorta is normal course and caliber. Normal appearance of thoracic esophagus though not tailored for evaluation. LUNGS/PLEURA: Tracheobronchial tree is patent, no pneumothorax. No pleural effusions, focal consolidations, pulmonary nodules or masses. Lingular atelectasis. Minimal dependent atelectasis. MUSCULOSKELETAL: Included soft tissues and included osseous structures appear normal. CT ABDOMEN PELVIS FINDINGS HEPATOBILIARY: Dense gallbladder sludge with mild suspected wall thickening. No biliary dilatation. Minimal focal fatty infiltration about the falciform ligament, liver is otherwise unremarkable. PANCREAS: Normal. SPLEEN: Normal. ADRENALS/URINARY TRACT: Kidneys are orthotopic, demonstrating normal size and morphology. 4 mm RIGHT lower pole nephrolithiasis. Hydronephrosis; limited assessment for renal masses on this nonenhanced examination. The unopacified ureters are normal in course and caliber. Urinary bladder is partially distended and unremarkable. Normal  adrenal glands. STOMACH/BOWEL: The stomach, small and large bowel are normal in course and caliber without inflammatory changes, sensitivity decreased by lack of enteric contrast. Mild amount of retained large bowel stool. Mild colonic diverticulosis. Normal appendix. VASCULAR/LYMPHATIC: Aortoiliac vessels are normal in course and caliber. Mild calcific atherosclerosis. No lymphadenopathy by CT size criteria. REPRODUCTIVE: Enlarged homogeneous uterus with lobulated contour. OTHER: No intraperitoneal free fluid or free air. MUSCULOSKELETAL:  Non-acute. Small fat containing inguinal hernias. Small fat containing umbilical hernia. Anterior pelvic wall scarring. Mild S-type scoliosis. IMPRESSION: CT CHEST: 1.  Mild cardiomegaly and small pericardial effusion. 2. No acute pulmonary process. CT ABDOMEN AND PELVIS: 1. Gallbladder sludge and possible cholecystitis though, ultrasound would be more sensitive. 2. Nonobstructing 4 mm RIGHT nephrolithiasis. 3. 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 Electronically Signed   By: Elon Alas M.D.   On: 02/12/2017 22:08    Procedures Procedures (including critical care time)  Medications Ordered in ED Medications  potassium chloride 10 mEq in 100 mL IVPB (10 mEq Intravenous New Bag/Given 02/12/17 2247)  morphine 4 MG/ML injection 4 mg (4 mg Intravenous Given 02/12/17 2238)  nicardipine (CARDENE) 20mg  in 0.86% saline 265ml IV infusion (0.1 mg/ml) (not administered)  nicardipine (CARDENE) 20mg  in 0.86% saline 285ml IV infusion (0.1 mg/ml) (0 mg/hr Intravenous Stopped 02/12/17 2252)  0.9 %  sodium chloride infusion ( Intravenous New Bag/Given 02/12/17 2247)  ondansetron (ZOFRAN) injection 4 mg (4 mg Intravenous Given 02/12/17 2238)     Initial Impression / Assessment and Plan / ED Course  I have reviewed the triage vital signs and the nursing notes.  Pertinent labs & imaging results that were available during my care of the patient were reviewed by me and considered in my medical decision making (see chart for details).    Patient push cart in for her hypertensive urgency/bleed and hypertension. This was titrated up. I reexamined her multiple times. She has no chest pain at this time. EKG does not show is acute or ischemic changes or injury. Elevated troponin. Acute kidney injury. Given morphine. Pain improves. Care discussed with Dr. Maudie Mercury, as well as cardiology fellow. Patient be transferred to [facility] for further evaluation. I did perform noncontrast CT of chest and  abdomen. This shows normal caliber aorta. This does not completely rule out dissection but I feel this is much less likely the diagnosis with a normal caliber aorta.  CRITICAL CARE Performed by: Tanna Furry JOSEPH   Total critical care time: 30 minutes  Critical care time was exclusive of separately billable procedures and treating other patients.  Critical care was necessary to treat or prevent imminent or life-threatening deterioration.  Critical care was time spent personally by me on the following activities: development of treatment plan with patient and/or surrogate as well as nursing, discussions with consultants, evaluation of patient's response to treatment, examination of patient, obtaining history from patient or surrogate, ordering and performing treatments and interventions, ordering and review of laboratory studies, ordering and review of radiographic studies, pulse oximetry and re-evaluation of patient's condition.   Final Clinical Impressions(s) / ED Diagnoses   Final diagnoses:  Epigastric pain    New Prescriptions New Prescriptions   No medications on file     Tanna Furry, MD 02/12/17 [phone]

## 2017-02-12 NOTE — ED Triage Notes (Signed)
Patient c/o abd pain that was tender to touch today as well as chest pressure since Saturday/Sunday.  Patient denies cough but reports that she hasnt really eaten a lot

## 2017-02-12 NOTE — H&P (Addendum)
TRH H&P   Patient Demographics:    Madeline Brown, is a 41 y.o. female  MRN: [id number]   DOB - [birth year]  Admit Date - 02/12/2017  Outpatient Primary MD for the patient is Patient, No Pcp Per   NONE  Referring MD/NP/PA: Tanna Furry  Outpatient Specialists:   Patient coming from: home  Chief Complaint  Patient presents with  . Abdominal Pain  . Chest Pain      HPI:    Madeline Brown  is a 41 y.o. female, w hypertension apparently presents due to epigastric pain for the past 2 days.  Slight nausea.   Denies fever, chills, emesis, diarrhea, brbpr, black stool, dysuria, hematuria.  Pain was "achy and sharp".  Pt took peptobismol => no relief.  Pt denies NSAIDS.  Pt denies iv contrast exposure.  Pt denies coccaine use.   IN ER, pt found to be in ARF bun/creat 32/2.70, K 2.3, Hgb 9.7,  Trop 0.38,  EKG => ST at 115, nl axis, poor R progression , no st-t changes c/w ischemia, slight u wave.   CT ABDOMEN AND PELVIS: 1. Gallbladder sludge and possible cholecystitis though, ultrasound would be more sensitive. 2. Nonobstructing 4 mm RIGHT nephrolithiasis. 3. 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  Pt will be admitted for ARF, hypokalemia and troponin elevation and abdomina pain, r/o cholecystitis.      Review of systems:    In addition to the HPI above,  No Fever-chills, No Headache, No changes with Vision or hearing, No problems swallowing food or Liquids, No Chest pain, Cough or Shortness of Breath, No Vommitting, Bowel movements are regular, No Blood in stool or Urine, No dysuria, No new skin rashes or bruises, No new joints pains-aches,  No new weakness, tingling, numbness in any extremity, No recent weight gain or loss, No polyuria, polydypsia or polyphagia, No significant Mental Stressors.  A full 10 point Review of Systems was done, except as stated  above, all other Review of Systems were negative.   With Past History of the following :    Past Medical History:  Diagnosis Date  . Hypertension       Past Surgical History:  Procedure Laterality Date  . CESAREAN SECTION     x2      Social History:     Social History  Substance Use Topics  . Smoking status: Current Every Day Smoker    Packs/day: 0.25    Years: 15.00    Types: Cigarettes  . Smokeless tobacco: Never Used  . Alcohol use Yes     Comment: occassional beer     Lives - at home  Mobility - walks by self   Family History :     Family History  Problem Relation Age of Onset  . Diabetes Father   . Hypertension Father   . Cancer Father  Home Medications:   Prior to Admission medications   Medication Sig Start Date End Date Taking? Authorizing Provider  Aspirin-Acetaminophen-Caffeine (GOODY HEADACHE PO) Take 1 packet by mouth daily as needed. For cramping   Yes [provider]  labetalol (NORMODYNE) 200 MG tablet Take 2 tablets (400 mg total) by mouth 3 (three) times daily. Patient not taking: Reported on 02/12/2017 06/17/16   Forde Dandy, MD     Allergies:     Allergies  Allergen Reactions  . Visine [Tetrahydrozoline Hcl] Other (See Comments)    Eyes Swelling.     Physical Exam:   Vitals  Blood pressure (!) 152/98, pulse (!) 110, temperature 98.5 F (36.9 C), temperature source Oral, resp. rate (!) 24, height 5' (1.524 m), weight 81.6 kg (180 lb), last menstrual period 02/10/2017, SpO2 97 %, unknown if currently breastfeeding.   1. General  lying in bed in NAD,  2. Normal affect and insight, Not Suicidal or Homicidal, Awake Alert, Oriented X 3.  3. No F.N deficits, ALL C.Nerves Intact, Strength 5/5 all 4 extremities, Sensation intact all 4 extremities, Plantars down going.  4. Ears and Eyes appear Normal, Conjunctivae clear, PERRLA. Moist Oral Mucosa.  5. Supple Neck, No JVD, No cervical lymphadenopathy appriciated, No  Carotid Bruits.  6. Symmetrical Chest wall movement, Good air movement bilaterally, CTAB.  7. Tachycardia s1, s2, No Gallops, Rubs or Murmurs, No Parasternal Heave.  8. Positive Bowel Sounds, Abdomen Soft, No tenderness, No organomegaly appriciated,No rebound -guarding or rigidity.  9.  No Cyanosis, Normal Skin Turgor, No Skin Rash or Bruise.  10. Good muscle tone,  joints appear normal , no effusions, Normal ROM.  11. No Palpable Lymph Nodes in Neck or Axillae  Negative murphy sign   Data Review:    CBC  Recent Labs Lab 02/12/17 1856  WBC 8.8  HGB 9.7*  HCT 32.7*  PLT 68*  MCV 72.5*  MCH 21.5*  MCHC 29.7*  RDW 21.0*   ------------------------------------------------------------------------------------------------------------------  Chemistries   Recent Labs Lab 02/12/17 1856 02/12/17 2032  NA 134*  --   K 2.3*  --   CL 99*  --   CO2 23  --   GLUCOSE 118*  --   BUN 32*  --   CREATININE 2.70*  --   CALCIUM 9.2  --   AST  --  38  ALT  --  16  ALKPHOS  --  53  BILITOT  --  1.3*   ------------------------------------------------------------------------------------------------------------------ estimated creatinine clearance is 26.2 mL/min (A) (by C-G formula based on SCr of 2.7 mg/dL (H)). ------------------------------------------------------------------------------------------------------------------ No results for input(s): TSH, T4TOTAL, T3FREE, THYROIDAB in the last 72 hours.  Invalid input(s): FREET3  Coagulation profile No results for input(s): INR, PROTIME in the last 168 hours. ------------------------------------------------------------------------------------------------------------------- No results for input(s): DDIMER in the last 72 hours. -------------------------------------------------------------------------------------------------------------------  Cardiac Enzymes No results for input(s): CKMB, TROPONINI, MYOGLOBIN in the last 168  hours.  Invalid input(s): CK ------------------------------------------------------------------------------------------------------------------ No results found for: BNP   ---------------------------------------------------------------------------------------------------------------  Urinalysis    Component Value Date/Time   COLORURINE YELLOW 06/17/2016 [place] 06/17/2016 1453   LABSPEC 1.014 06/17/2016 1453   PHURINE 7.0 06/17/2016 1453   GLUCOSEU 50 (A) 06/17/2016 1453   HGBUR NEGATIVE 06/17/2016 1453   BILIRUBINUR NEGATIVE 06/17/2016 1453   KETONESUR NEGATIVE 06/17/2016 1453   PROTEINUR 30 (A) 06/17/2016 1453   UROBILINOGEN 0.2 06/17/2016 1257   NITRITE NEGATIVE 06/17/2016 1453   LEUKOCYTESUR NEGATIVE 06/17/2016 1453    ----------------------------------------------------------------------------------------------------------------  Imaging Results:    Ct Abdomen Pelvis Wo Contrast  Result Date: 02/12/2017 CLINICAL DATA:  Abdominal pain, tender to palpation. Chest pressure for 4 days. Evaluate potential pleural effusion or pleurisy. History of hypertension. EXAM: CT CHEST, ABDOMEN AND PELVIS WITHOUT CONTRAST TECHNIQUE: Multidetector CT imaging of the chest, abdomen and pelvis was performed following the standard protocol without IV contrast. COMPARISON:  None. FINDINGS: CT CHEST FINDINGS CARDIOVASCULAR: The heart is mildly enlarged. Small pericardial effusion. MEDIASTINUM/NODES: No mediastinal mass. No lymphadenopathy by CT size criteria limited assessment without contrast. Subcentimeter anterior mediastinal lymph node. Thoracic aorta is normal course and caliber. Normal appearance of thoracic esophagus though not tailored for evaluation. LUNGS/PLEURA: Tracheobronchial tree is patent, no pneumothorax. No pleural effusions, focal consolidations, pulmonary nodules or masses. Lingular atelectasis. Minimal dependent atelectasis. MUSCULOSKELETAL: Included soft tissues  and included osseous structures appear normal. CT ABDOMEN PELVIS FINDINGS HEPATOBILIARY: Dense gallbladder sludge with mild suspected wall thickening. No biliary dilatation. Minimal focal fatty infiltration about the falciform ligament, liver is otherwise unremarkable. PANCREAS: Normal. SPLEEN: Normal. ADRENALS/URINARY TRACT: Kidneys are orthotopic, demonstrating normal size and morphology. 4 mm RIGHT lower pole nephrolithiasis. Hydronephrosis; limited assessment for renal masses on this nonenhanced examination. The unopacified ureters are normal in course and caliber. Urinary bladder is partially distended and unremarkable. Normal adrenal glands. STOMACH/BOWEL: The stomach, small and large bowel are normal in course and caliber without inflammatory changes, sensitivity decreased by lack of enteric contrast. Mild amount of retained large bowel stool. Mild colonic diverticulosis. Normal appendix. VASCULAR/LYMPHATIC: Aortoiliac vessels are normal in course and caliber. Mild calcific atherosclerosis. No lymphadenopathy by CT size criteria. REPRODUCTIVE: Enlarged homogeneous uterus with lobulated contour. OTHER: No intraperitoneal free fluid or free air. MUSCULOSKELETAL: Non-acute. Small fat containing inguinal hernias. Small fat containing umbilical hernia. Anterior pelvic wall scarring. Mild S-type scoliosis. IMPRESSION: CT CHEST: 1.  Mild cardiomegaly and small pericardial effusion. 2. No acute pulmonary process. CT ABDOMEN AND PELVIS: 1. Gallbladder sludge and possible cholecystitis though, ultrasound would be more sensitive. 2. Nonobstructing 4 mm RIGHT nephrolithiasis. 3. 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 Electronically Signed   By: Elon Alas M.D.   On: 02/12/2017 22:08   Dg Chest 2 View  Result Date: 02/12/2017 CLINICAL DATA:  Five day history of chest pressure. Current smoker. Current history of hypertension. EXAM: CHEST  2 VIEW COMPARISON:  None. FINDINGS: Cardiac silhouette  moderately to markedly enlarged. Thoracic aorta mildly tortuous. Hilar and mediastinal contours otherwise unremarkable. Lungs clear. Bronchovascular markings normal. Pulmonary vascularity normal. No visible pleural effusions. No pneumothorax. Mild thoracic dextroscoliosis and thoracolumbar levoscoliosis. IMPRESSION: 1.  No acute cardiopulmonary disease. 2. Moderate to marked cardiomegaly. Electronically Signed   By: Evangeline Dakin M.D.   On: 02/12/2017 19:10   Ct Chest Wo Contrast  Result Date: 02/12/2017 CLINICAL DATA:  Abdominal pain, tender to palpation. Chest pressure for 4 days. Evaluate potential pleural effusion or pleurisy. History of hypertension. EXAM: CT CHEST, ABDOMEN AND PELVIS WITHOUT CONTRAST TECHNIQUE: Multidetector CT imaging of the chest, abdomen and pelvis was performed following the standard protocol without IV contrast. COMPARISON:  None. FINDINGS: CT CHEST FINDINGS CARDIOVASCULAR: The heart is mildly enlarged. Small pericardial effusion. MEDIASTINUM/NODES: No mediastinal mass. No lymphadenopathy by CT size criteria limited assessment without contrast. Subcentimeter anterior mediastinal lymph node. Thoracic aorta is normal course and caliber. Normal appearance of thoracic esophagus though not tailored for evaluation. LUNGS/PLEURA: Tracheobronchial tree is patent, no pneumothorax. No pleural effusions, focal consolidations, pulmonary nodules or masses. Lingular atelectasis. Minimal dependent atelectasis. MUSCULOSKELETAL:  Included soft tissues and included osseous structures appear normal. CT ABDOMEN PELVIS FINDINGS HEPATOBILIARY: Dense gallbladder sludge with mild suspected wall thickening. No biliary dilatation. Minimal focal fatty infiltration about the falciform ligament, liver is otherwise unremarkable. PANCREAS: Normal. SPLEEN: Normal. ADRENALS/URINARY TRACT: Kidneys are orthotopic, demonstrating normal size and morphology. 4 mm RIGHT lower pole nephrolithiasis. Hydronephrosis; limited  assessment for renal masses on this nonenhanced examination. The unopacified ureters are normal in course and caliber. Urinary bladder is partially distended and unremarkable. Normal adrenal glands. STOMACH/BOWEL: The stomach, small and large bowel are normal in course and caliber without inflammatory changes, sensitivity decreased by lack of enteric contrast. Mild amount of retained large bowel stool. Mild colonic diverticulosis. Normal appendix. VASCULAR/LYMPHATIC: Aortoiliac vessels are normal in course and caliber. Mild calcific atherosclerosis. No lymphadenopathy by CT size criteria. REPRODUCTIVE: Enlarged homogeneous uterus with lobulated contour. OTHER: No intraperitoneal free fluid or free air. MUSCULOSKELETAL: Non-acute. Small fat containing inguinal hernias. Small fat containing umbilical hernia. Anterior pelvic wall scarring. Mild S-type scoliosis. IMPRESSION: CT CHEST: 1.  Mild cardiomegaly and small pericardial effusion. 2. No acute pulmonary process. CT ABDOMEN AND PELVIS: 1. Gallbladder sludge and possible cholecystitis though, ultrasound would be more sensitive. 2. Nonobstructing 4 mm RIGHT nephrolithiasis. 3. 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 Electronically Signed   By: Elon Alas M.D.   On: 02/12/2017 22:08      Assessment & Plan:    Principal Problem:   ARF (acute renal failure) (HCC) Active Problems:   Anemia   Hypokalemia   Elevated troponin   Hyponatremia   Tachycardia    ARF Check cpk,  Check urine sodium, urine creatinine, urine eosinophils Check spep, upep Hydrate with ns iv  Hypokalemia Check magnesium Replete Check cmp in am  Hyponatremia Check cmp in am  Troponin elevation Tele Trop I q6h x3 Check cardiac echo  Anemia Check ferritin, iron, tibc, folate, b12, tsh Check cbc in am  Thrombocytopenia Check LDH Check cbc in am  Epigastric pain Check RUQ ultrasound Contact surgery depending upon results  Hypertension  uncontrolled cardene gtt iv Hydralazine 50mg  po tid Hydralazine 10mg  iv q6h prn    DVT Prophylaxis  SCDs   AM Labs Ordered, also please review Full Orders  Family Communication: Admission, patients condition and plan of care including tests being ordered have been discussed with the patient  who indicate understanding and agree with the plan and Code Status.  Code Status  FULL CODE  Likely DC to  home  Condition GUARDED    Consults called:  Cardiology by ED  Admission status: inpatient  Time spent in minutes : 45 minutes   Jani Gravel M.D on 02/12/2017 at 11:23 PM  Between 7am to 7pm - Pager - [phone]. After 7pm go to [email or web address] - password [facility]  [facility] - Office  [phone]

## 2017-02-12 NOTE — ED Notes (Signed)
Pt has a troponin of 0.38 EDP James made aware.

## 2017-02-13 ENCOUNTER — Inpatient Hospital Stay (HOSPITAL_COMMUNITY): Payer: Medicaid Other

## 2017-02-13 DIAGNOSIS — R000 Tachycardia, unspecified: Secondary | ICD-10-CM

## 2017-02-13 DIAGNOSIS — D696 Thrombocytopenia, unspecified: Secondary | ICD-10-CM

## 2017-02-13 DIAGNOSIS — R109 Unspecified abdominal pain: Secondary | ICD-10-CM

## 2017-02-13 DIAGNOSIS — D509 Iron deficiency anemia, unspecified: Secondary | ICD-10-CM

## 2017-02-13 DIAGNOSIS — R748 Abnormal levels of other serum enzymes: Secondary | ICD-10-CM

## 2017-02-13 DIAGNOSIS — N179 Acute kidney failure, unspecified: Principal | ICD-10-CM

## 2017-02-13 DIAGNOSIS — D649 Anemia, unspecified: Secondary | ICD-10-CM

## 2017-02-13 DIAGNOSIS — I10 Essential (primary) hypertension: Secondary | ICD-10-CM

## 2017-02-13 DIAGNOSIS — I340 Nonrheumatic mitral (valve) insufficiency: Secondary | ICD-10-CM

## 2017-02-13 LAB — IRON AND TIBC
Iron: 33 ug/dL (ref 28–170)
SATURATION RATIOS: 10 % — AB (ref 10.4–31.8)
TIBC: 346 ug/dL (ref 250–450)
UIBC: 313 ug/dL

## 2017-02-13 LAB — DIC (DISSEMINATED INTRAVASCULAR COAGULATION)PANEL
Platelets: 75 10*3/uL — ABNORMAL LOW (ref 150–400)
aPTT: 29 seconds (ref 24–36)

## 2017-02-13 LAB — HEMOGLOBIN A1C
HEMOGLOBIN A1C: 4.6 % — AB (ref 4.8–5.6)
MEAN PLASMA GLUCOSE: 85.32 mg/dL

## 2017-02-13 LAB — DIC (DISSEMINATED INTRAVASCULAR COAGULATION) PANEL
D DIMER QUANT: 4.4 ug{FEU}/mL — AB (ref 0.00–0.50)
FIBRINOGEN: 498 mg/dL — AB (ref 210–475)
INR: 1.07
PROTHROMBIN TIME: 13.8 s (ref 11.4–15.2)

## 2017-02-13 LAB — CBC
HEMATOCRIT: 27.6 % — AB (ref 36.0–46.0)
HEMOGLOBIN: 8.1 g/dL — AB (ref 12.0–15.0)
MCH: 21.5 pg — ABNORMAL LOW (ref 26.0–34.0)
MCHC: 29.3 g/dL — AB (ref 30.0–36.0)
MCV: 73.2 fL — AB (ref 78.0–100.0)
Platelets: 63 10*3/uL — ABNORMAL LOW (ref 150–400)
RBC: 3.77 MIL/uL — ABNORMAL LOW (ref 3.87–5.11)
RDW: 21.5 % — ABNORMAL HIGH (ref 11.5–15.5)
WBC: 8.5 10*3/uL (ref 4.0–10.5)

## 2017-02-13 LAB — URINALYSIS, ROUTINE W REFLEX MICROSCOPIC
Bacteria, UA: NONE SEEN
Bilirubin Urine: NEGATIVE
GLUCOSE, UA: NEGATIVE mg/dL
Hgb urine dipstick: 3 — AB
KETONES UR: NEGATIVE mg/dL
Nitrite: NEGATIVE
Specific Gravity, Urine: 1.02 (ref 1.005–1.030)
pH: 6 (ref 5.0–8.0)

## 2017-02-13 LAB — DIRECT ANTIGLOBULIN TEST (NOT AT ARMC)
DAT, IgG: NEGATIVE
DAT, complement: NEGATIVE

## 2017-02-13 LAB — BASIC METABOLIC PANEL
Anion gap: 11 (ref 5–15)
BUN: 32 mg/dL — AB (ref 6–20)
CHLORIDE: 101 mmol/L (ref 101–111)
CO2: 22 mmol/L (ref 22–32)
CREATININE: 2.63 mg/dL — AB (ref 0.44–1.00)
Calcium: 8.5 mg/dL — ABNORMAL LOW (ref 8.9–10.3)
GFR calc Af Amer: 25 mL/min — ABNORMAL LOW (ref >60)
GFR calc non Af Amer: 22 mL/min — ABNORMAL LOW (ref >60)
GLUCOSE: 107 mg/dL — AB (ref 65–99)
POTASSIUM: 2.9 mmol/L — AB (ref 3.5–5.1)
SODIUM: 134 mmol/L — AB (ref 135–145)

## 2017-02-13 LAB — ECHOCARDIOGRAM COMPLETE
HEIGHTINCHES: 60 in
Weight: 2880 oz

## 2017-02-13 LAB — HEPATIC FUNCTION PANEL
ALBUMIN: 3.2 g/dL — AB (ref 3.5–5.0)
ALT: 14 U/L (ref 14–54)
AST: 30 U/L (ref 15–41)
Alkaline Phosphatase: 48 U/L (ref 38–126)
BILIRUBIN TOTAL: 1.1 mg/dL (ref 0.3–1.2)
Bilirubin, Direct: 0.2 mg/dL (ref 0.1–0.5)
Indirect Bilirubin: 0.9 mg/dL (ref 0.3–0.9)
Total Protein: 6.7 g/dL (ref 6.5–8.1)

## 2017-02-13 LAB — PATHOLOGIST SMEAR REVIEW

## 2017-02-13 LAB — CK TOTAL AND CKMB (NOT AT ARMC)
CK, MB: 4.5 ng/mL (ref 0.5–5.0)
RELATIVE INDEX: INVALID (ref 0.0–2.5)
Total CK: 37 U/L — ABNORMAL LOW (ref 38–234)

## 2017-02-13 LAB — LACTATE DEHYDROGENASE: LDH: 682 U/L — ABNORMAL HIGH (ref 98–192)

## 2017-02-13 LAB — SAVE SMEAR

## 2017-02-13 LAB — VITAMIN B12: VITAMIN B 12: 2634 pg/mL — AB (ref 180–914)

## 2017-02-13 LAB — MAGNESIUM: MAGNESIUM: 1.6 mg/dL — AB (ref 1.7–2.4)

## 2017-02-13 LAB — TROPONIN I: TROPONIN I: 0.39 ng/mL — AB (ref <0.03)

## 2017-02-13 LAB — TSH: TSH: 1.062 u[IU]/mL (ref 0.350–4.500)

## 2017-02-13 LAB — RETICULOCYTES
RBC.: 3.5 MIL/uL — ABNORMAL LOW (ref 3.87–5.11)
RETIC COUNT ABSOLUTE: 189 10*3/uL — AB (ref 19.0–186.0)
RETIC CT PCT: 5.4 % — AB (ref 0.4–3.1)

## 2017-02-13 LAB — OSMOLALITY: Osmolality: 289 mOsm/kg (ref 275–295)

## 2017-02-13 LAB — SODIUM, URINE, RANDOM: SODIUM UR: 77 mmol/L

## 2017-02-13 LAB — FERRITIN: FERRITIN: 14 ng/mL (ref 11–307)

## 2017-02-13 MED ORDER — NIFEDIPINE ER OSMOTIC RELEASE 30 MG PO TB24
60.0000 mg | ORAL_TABLET | Freq: Every day | ORAL | Status: DC
  Administered 2017-02-13 – 2017-02-17 (×5): 60 mg via ORAL
  Filled 2017-02-13 (×5): qty 2

## 2017-02-13 MED ORDER — HYDRALAZINE HCL 20 MG/ML IJ SOLN
10.0000 mg | Freq: Four times a day (QID) | INTRAMUSCULAR | Status: DC | PRN
  Administered 2017-02-13 – 2017-02-17 (×4): 10 mg via INTRAVENOUS
  Filled 2017-02-13 (×4): qty 1

## 2017-02-13 MED ORDER — HYDRALAZINE HCL 50 MG PO TABS
50.0000 mg | ORAL_TABLET | Freq: Three times a day (TID) | ORAL | Status: DC
  Administered 2017-02-13 (×2): 50 mg via ORAL
  Filled 2017-02-13 (×2): qty 1

## 2017-02-13 MED ORDER — CARVEDILOL 25 MG PO TABS
25.0000 mg | ORAL_TABLET | Freq: Two times a day (BID) | ORAL | Status: DC
  Administered 2017-02-13 – 2017-02-19 (×12): 25 mg via ORAL
  Filled 2017-02-13 (×13): qty 1

## 2017-02-13 MED ORDER — ACETAMINOPHEN 650 MG RE SUPP: 650.0000 mg | Freq: Four times a day (QID) | RECTAL | Status: DC | PRN

## 2017-02-13 MED ORDER — MAGNESIUM SULFATE 2 GM/50ML IV SOLN
2.0000 g | Freq: Once | INTRAVENOUS | Status: AC
Start: 2017-02-13 — End: 2017-02-13
  Administered 2017-02-13: 2 g via INTRAVENOUS
  Filled 2017-02-13: qty 50

## 2017-02-13 MED ORDER — SODIUM CHLORIDE 0.9 % IV SOLN
INTRAVENOUS | Status: DC
  Administered 2017-02-13: 09:00:00 via INTRAVENOUS

## 2017-02-13 MED ORDER — POTASSIUM CHLORIDE CRYS ER 20 MEQ PO TBCR
20.0000 meq | EXTENDED_RELEASE_TABLET | Freq: Once | ORAL | Status: AC
  Administered 2017-02-13: 20 meq via ORAL
  Filled 2017-02-13: qty 1

## 2017-02-13 MED ORDER — POTASSIUM CHLORIDE 10 MEQ/100ML IV SOLN
INTRAVENOUS | Status: AC
  Filled 2017-02-13: qty 100

## 2017-02-13 MED ORDER — HYDRALAZINE HCL 50 MG PO TABS: 50.0000 mg | ORAL_TABLET | Freq: Three times a day (TID) | ORAL | Status: DC

## 2017-02-13 MED ORDER — TECHNETIUM TC 99M MEBROFENIN IV KIT: 5.0000 | PACK | Freq: Once | INTRAVENOUS | Status: DC | PRN

## 2017-02-13 MED ORDER — TECHNETIUM TC 99M MEBROFENIN IV KIT
5.3000 | PACK | Freq: Once | INTRAVENOUS | Status: AC | PRN
  Administered 2017-02-13: 5.3 via INTRAVENOUS

## 2017-02-13 MED ORDER — HYDRALAZINE HCL 20 MG/ML IJ SOLN
10.0000 mg | INTRAMUSCULAR | Status: DC | PRN
  Administered 2017-02-13: 10 mg via INTRAVENOUS
  Filled 2017-02-13: qty 1

## 2017-02-13 MED ORDER — SODIUM CHLORIDE 0.9 % IV SOLN
510.0000 mg | Freq: Once | INTRAVENOUS | Status: AC
  Administered 2017-02-14: 510 mg via INTRAVENOUS
  Filled 2017-02-13 (×2): qty 17

## 2017-02-13 MED ORDER — HYDRALAZINE HCL 50 MG PO TABS
100.0000 mg | ORAL_TABLET | Freq: Three times a day (TID) | ORAL | Status: DC
  Administered 2017-02-13 – 2017-02-19 (×17): 100 mg via ORAL
  Filled 2017-02-13 (×20): qty 2

## 2017-02-13 MED ORDER — ACETAMINOPHEN 325 MG PO TABS
650.0000 mg | ORAL_TABLET | Freq: Four times a day (QID) | ORAL | Status: DC | PRN
  Administered 2017-02-16: 650 mg via ORAL
  Filled 2017-02-13: qty 2

## 2017-02-13 MED ORDER — CEFTRIAXONE SODIUM 1 G IJ SOLR
1.0000 g | INTRAMUSCULAR | Status: DC
  Administered 2017-02-13 – 2017-02-18 (×7): 1 g via INTRAVENOUS
  Filled 2017-02-13 (×8): qty 10

## 2017-02-13 MED ORDER — POTASSIUM CHLORIDE IN NACL 40-0.9 MEQ/L-% IV SOLN
INTRAVENOUS | Status: DC
  Filled 2017-02-13: qty 1000

## 2017-02-13 NOTE — Consult Note (Signed)
Reason for Consult: Acute kidney injury, anemia/thrombocytopenia-concern for TTP Referring Physician: Niel Hummer M.D. [facility])  HPI:  41 year old African-American woman with history of hypertension, obesity and ongoing tobacco use who was admitted to the hospital with epigastric pain of 5 days and associated nausea of 2 days in duration that was not relieved by Pepto-Bismol. Yesterday had significantly elevated blood pressures of as high as 217/170 and acute renal failure (creatinine 2.7 from a baseline of 0.7 earlier this year) constituting a hypertensive emergency. Transferred over to [facility] from [place] for concerns of TTP/HUS with thrombocytopenia and anemia in the face of acute renal failure-results tabulated below. Per report from Dr. Tyrell Antonio, schistocytes seen on blood smear performed earlier today. LDH elevated at 682 with total bilirubin of 1.1-direct 0.2. Overnight has continued to have elevated blood pressures with her most recent one being 167/113. Urinalysis shows hematuria, leukocyturia and proteinuria.   She reports recent use of "several packets" of Goody powders as well as ibuprofen for alleviation of menstrual cramps. She denies any vomiting or diarrhea and does not have any melena, hematochezia or hematemesis. She denies any dysuria, urgency, frequency, flank pain, fever or chills. She denies any focal neurological symptoms and reports some headaches associated with her elevated blood pressures. She has not been on antihypertensive therapy for the past 6 months or so. She denies any focal rash/arthralgias.    06/17/2016  02/12/2017  02/13/2017   BUN 8 32 (H) 32 (H)  Creatinine 0.72 2.70 (H) 2.63 (H)    06/17/2016  02/12/2017  02/13/2017   Hemoglobin 10.4 (L) 9.7 (L) 8.1 (L)  Platelets 228 68 (L) 63 (L)   Renal ultrasound shows evidence of increased bilateral cortical echogenicity with a nonobstructive right renal calculus and no hydronephrosis. Kidneys appear fairly  symmetrical. CT scan of the abdomen/pelvis showed gallbladder sludge and possible cholecystitis.  Past Medical History:  Diagnosis Date  . Hypertension     Past Surgical History:  Procedure Laterality Date  . CESAREAN SECTION     x2    Family History  Problem Relation Age of Onset  . Diabetes Father   . Hypertension Father   . Cancer Father     Social History:  reports that she has been smoking Cigarettes.  She has a 3.75 pack-year smoking history. She has never used smokeless tobacco. She reports that she drinks alcohol. She reports that she uses drugs, including Marijuana.  She has 5 children ages ranging from 35 years old to 75 years old (41 daughters and 1 son). She is originally from [place] in [place] and moved here "couple years back". Currently works at [employer] on [employer].  Allergies:  Allergies  Allergen Reactions  . Visine [Tetrahydrozoline Hcl] Other (See Comments)    Eyes Swelling.    Medications:  Scheduled: . carvedilol  25 mg Oral BID WC  . hydrALAZINE  100 mg Oral Q8H    BMP Latest Ref Rng & Units 02/13/2017 02/12/2017 06/17/2016  Glucose 65 - 99 mg/dL 107(H) 118(H) 88  BUN 6 - 20 mg/dL 32(H) 32(H) 8  Creatinine 0.44 - 1.00 mg/dL 2.63(H) 2.70(H) 0.72  Sodium 135 - 145 mmol/L 134(L) 134(L) 132(L)  Potassium 3.5 - 5.1 mmol/L 2.9(L) 2.3(LL) 3.4(L)  Chloride 101 - 111 mmol/L 101 99(L) 105  CO2 22 - 32 mmol/L 22 23 18(L)  Calcium 8.9 - 10.3 mg/dL 8.5(L) 9.2 8.9   CBC Latest Ref Rng & Units 02/13/2017 02/12/2017 06/17/2016  WBC 4.0 - 10.5  K/uL 8.5 8.8 7.6  Hemoglobin 12.0 - 15.0 g/dL 8.1(L) 9.7(L) 10.4(L)  Hematocrit 36.0 - 46.0 % 27.6(L) 32.7(L) 34.6(L)  Platelets 150 - 400 K/uL 63(L) 68(L) 228   Urinalysis    Component Value Date/Time   COLORURINE ORANGE (A) 02/13/2017 0827   APPEARANCEUR HAZY (A) 02/13/2017 0827   LABSPEC 1.020 02/13/2017 0827   PHURINE 6.0 02/13/2017 0827   GLUCOSEU NEGATIVE 02/13/2017 0827   HGBUR 3 (A) 02/13/2017 0827    BILIRUBINUR NEGATIVE 02/13/2017 0827   KETONESUR NEGATIVE 02/13/2017 0827   PROTEINUR >300 (A) 02/13/2017 0827   UROBILINOGEN 0.2 06/17/2016 1257   NITRITE NEGATIVE 02/13/2017 0827   LEUKOCYTESUR TRACE (A) 02/13/2017 0827       Ct Abdomen Pelvis Wo Contrast  Result Date: 02/12/2017 CLINICAL DATA:  Abdominal pain, tender to palpation. Chest pressure for 4 days. Evaluate potential pleural effusion or pleurisy. History of hypertension. EXAM: CT CHEST, ABDOMEN AND PELVIS WITHOUT CONTRAST TECHNIQUE: Multidetector CT imaging of the chest, abdomen and pelvis was performed following the standard protocol without IV contrast. COMPARISON:  None. FINDINGS: CT CHEST FINDINGS CARDIOVASCULAR: The heart is mildly enlarged. Small pericardial effusion. MEDIASTINUM/NODES: No mediastinal mass. No lymphadenopathy by CT size criteria limited assessment without contrast. Subcentimeter anterior mediastinal lymph node. Thoracic aorta is normal course and caliber. Normal appearance of thoracic esophagus though not tailored for evaluation. LUNGS/PLEURA: Tracheobronchial tree is patent, no pneumothorax. No pleural effusions, focal consolidations, pulmonary nodules or masses. Lingular atelectasis. Minimal dependent atelectasis. MUSCULOSKELETAL: Included soft tissues and included osseous structures appear normal. CT ABDOMEN PELVIS FINDINGS HEPATOBILIARY: Dense gallbladder sludge with mild suspected wall thickening. No biliary dilatation. Minimal focal fatty infiltration about the falciform ligament, liver is otherwise unremarkable. PANCREAS: Normal. SPLEEN: Normal. ADRENALS/URINARY TRACT: Kidneys are orthotopic, demonstrating normal size and morphology. 4 mm RIGHT lower pole nephrolithiasis. Hydronephrosis; limited assessment for renal masses on this nonenhanced examination. The unopacified ureters are normal in course and caliber. Urinary bladder is partially distended and unremarkable. Normal adrenal glands. STOMACH/BOWEL: The  stomach, small and large bowel are normal in course and caliber without inflammatory changes, sensitivity decreased by lack of enteric contrast. Mild amount of retained large bowel stool. Mild colonic diverticulosis. Normal appendix. VASCULAR/LYMPHATIC: Aortoiliac vessels are normal in course and caliber. Mild calcific atherosclerosis. No lymphadenopathy by CT size criteria. REPRODUCTIVE: Enlarged homogeneous uterus with lobulated contour. OTHER: No intraperitoneal free fluid or free air. MUSCULOSKELETAL: Non-acute. Small fat containing inguinal hernias. Small fat containing umbilical hernia. Anterior pelvic wall scarring. Mild S-type scoliosis. IMPRESSION: CT CHEST: 1.  Mild cardiomegaly and small pericardial effusion. 2. No acute pulmonary process. CT ABDOMEN AND PELVIS: 1. Gallbladder sludge and possible cholecystitis though, ultrasound would be more sensitive. 2. Nonobstructing 4 mm RIGHT nephrolithiasis. 3. 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 Electronically Signed   By: Elon Alas M.D.   On: 02/12/2017 22:08   Dg Chest 2 View  Result Date: 02/12/2017 CLINICAL DATA:  Five day history of chest pressure. Current smoker. Current history of hypertension. EXAM: CHEST  2 VIEW COMPARISON:  None. FINDINGS: Cardiac silhouette moderately to markedly enlarged. Thoracic aorta mildly tortuous. Hilar and mediastinal contours otherwise unremarkable. Lungs clear. Bronchovascular markings normal. Pulmonary vascularity normal. No visible pleural effusions. No pneumothorax. Mild thoracic dextroscoliosis and thoracolumbar levoscoliosis. IMPRESSION: 1.  No acute cardiopulmonary disease. 2. Moderate to marked cardiomegaly. Electronically Signed   By: Evangeline Dakin M.D.   On: 02/12/2017 19:10   Ct Chest Wo Contrast  Result Date: 02/12/2017 CLINICAL DATA:  Abdominal pain,  tender to palpation. Chest pressure for 4 days. Evaluate potential pleural effusion or pleurisy. History of hypertension. EXAM: CT  CHEST, ABDOMEN AND PELVIS WITHOUT CONTRAST TECHNIQUE: Multidetector CT imaging of the chest, abdomen and pelvis was performed following the standard protocol without IV contrast. COMPARISON:  None. FINDINGS: CT CHEST FINDINGS CARDIOVASCULAR: The heart is mildly enlarged. Small pericardial effusion. MEDIASTINUM/NODES: No mediastinal mass. No lymphadenopathy by CT size criteria limited assessment without contrast. Subcentimeter anterior mediastinal lymph node. Thoracic aorta is normal course and caliber. Normal appearance of thoracic esophagus though not tailored for evaluation. LUNGS/PLEURA: Tracheobronchial tree is patent, no pneumothorax. No pleural effusions, focal consolidations, pulmonary nodules or masses. Lingular atelectasis. Minimal dependent atelectasis. MUSCULOSKELETAL: Included soft tissues and included osseous structures appear normal. CT ABDOMEN PELVIS FINDINGS HEPATOBILIARY: Dense gallbladder sludge with mild suspected wall thickening. No biliary dilatation. Minimal focal fatty infiltration about the falciform ligament, liver is otherwise unremarkable. PANCREAS: Normal. SPLEEN: Normal. ADRENALS/URINARY TRACT: Kidneys are orthotopic, demonstrating normal size and morphology. 4 mm RIGHT lower pole nephrolithiasis. Hydronephrosis; limited assessment for renal masses on this nonenhanced examination. The unopacified ureters are normal in course and caliber. Urinary bladder is partially distended and unremarkable. Normal adrenal glands. STOMACH/BOWEL: The stomach, small and large bowel are normal in course and caliber without inflammatory changes, sensitivity decreased by lack of enteric contrast. Mild amount of retained large bowel stool. Mild colonic diverticulosis. Normal appendix. VASCULAR/LYMPHATIC: Aortoiliac vessels are normal in course and caliber. Mild calcific atherosclerosis. No lymphadenopathy by CT size criteria. REPRODUCTIVE: Enlarged homogeneous uterus with lobulated contour. OTHER: No  intraperitoneal free fluid or free air. MUSCULOSKELETAL: Non-acute. Small fat containing inguinal hernias. Small fat containing umbilical hernia. Anterior pelvic wall scarring. Mild S-type scoliosis. IMPRESSION: CT CHEST: 1.  Mild cardiomegaly and small pericardial effusion. 2. No acute pulmonary process. CT ABDOMEN AND PELVIS: 1. Gallbladder sludge and possible cholecystitis though, ultrasound would be more sensitive. 2. Nonobstructing 4 mm RIGHT nephrolithiasis. 3. 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 Electronically Signed   By: Elon Alas M.D.   On: 02/12/2017 22:08   US Renal  Result Date: 02/13/2017 CLINICAL DATA:  Acute kidney injury EXAM: RENAL / URINARY TRACT ULTRASOUND COMPLETE COMPARISON:  None. FINDINGS: Right Kidney: Length: 10.5 cm. Increased renal cortical echogenicity. 10 mm nonobstructing right renal calculus. No mass or hydronephrosis visualized. Left Kidney: Length: 9.6 cm. Increased renal cortical echogenicity. No mass or hydronephrosis visualized. Bladder: Appears normal for degree of bladder distention. IMPRESSION: 1. No obstructive uropathy. 2. Increased bilateral renal cortical echogenicity as can be seen with medical renal disease or acute kidney injury. Electronically Signed   By: Kathreen Devoid   On: 02/13/2017 09:04   US Abdomen Limited Ruq  Result Date: 02/13/2017 CLINICAL DATA:  Epigastric pain tonight. EXAM: ULTRASOUND ABDOMEN LIMITED RIGHT UPPER QUADRANT COMPARISON:  CT 02/12/2017 FINDINGS: Gallbladder: Gallbladder sludge. Mild mural thickening. No pericholecystic fluid. No tenderness to probe pressure over the gallbladder. Common bile duct: Diameter: Normal, 2.7 mm. Liver: Generalized coarsening of hepatic echotexture without focal lesion. Portal vein is patent on color Doppler imaging with normal direction of blood flow towards the liver. IMPRESSION: 1. Gallbladder sludge. No visible calculi. Normal bile ducts. Negative sonographic Murphy sign. 2.  Coarsened hepatic parenchymal echotexture could represent fatty infiltration or hepatocellular disease. Electronically Signed   By: Andreas Newport M.D.   On: 02/13/2017 00:43    Review of Systems  Constitutional: Negative.   HENT: Negative.   Eyes: Negative.   Respiratory: Negative.   Cardiovascular: Positive for  palpitations. Negative for orthopnea, claudication and leg swelling.       She has had some palpitations on and off-possibly associated with withdrawal from clonidine and later labetalol  Gastrointestinal: Positive for abdominal pain and nausea. Negative for diarrhea, heartburn and vomiting.  Genitourinary: Negative.   Musculoskeletal: Negative.   Skin: Negative.   Neurological: Positive for headaches. Negative for dizziness, sensory change, speech change and focal weakness.       Associated with elevated blood pressure   Blood pressure (!) 167/113, pulse (!) 106, temperature 99.1 F (37.3 C), temperature source Oral, resp. rate 16, height 5' (1.524 m), weight 81.6 kg (180 lb), last menstrual period 02/10/2017, SpO2 93 %, unknown if currently breastfeeding. Physical Exam  Nursing note and vitals reviewed. Constitutional: She is oriented to person, place, and time. She appears well-developed and well-nourished. No distress.  HENT:  Head: Normocephalic and atraumatic.  Mouth/Throat: Oropharynx is clear and moist.  Eyes: Pupils are equal, round, and reactive to light. EOM are normal. No scleral icterus.  Neck: Normal range of motion. Neck supple. No JVD present.  Cardiovascular: Regular rhythm.   Murmur heard. Regular tachycardia, S1 and S2 with ejection systolic murmur  Respiratory: Effort normal and breath sounds normal. She has no wheezes. She has no rales.  GI: Soft. She exhibits no mass. There is tenderness. There is guarding.  Right upper quadrant and epigastric tenderness with mild guarding  Musculoskeletal: She exhibits no edema.  Neurological: She is alert and  oriented to person, place, and time.  Skin: Skin is warm and dry. No rash noted. No erythema.  Psychiatric: She has a normal mood and affect.    Assessment/Plan: 1. Hypertensive emergency: Significantly elevated blood pressures on admission with acute kidney injury-on carvedilol 25 mg twice a day, hydralazine 100 mg 3 times a day and previously nicardipine drip. Prior to admission, she was on clonidine until January 2018 that she stopped taking after running out of refills/prohibitive co-pay for physician visit and was transitioned to labetalol that was started in the emergency room after consultation with obstetrics when she was discovered to be pregnant in January of this year. She has not been on antihypertensive therapy now for the past 6 months or so and we had a lengthy discussion regarding the risks of uncontrolled blood pressure. We'll add nifedipine XL 60 mg daily at bedtime. 2. Acute kidney injury: Most likely hemodynamically mediated with hypertensive emergency and suspect that blood pressure control would be associated with slight rise of creatinine prior to some eventual improvement. A clear differential would be TTP/HUS in the face of associated anemia and thrombocytopenia although clinically suspected to be more consistent with TMA associated with hypertensive emergency. Renal ultrasound negative for any obstruction. We'll check urine electrolytes and at this time focus on optimizing blood pressure control. 3. 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 Awaiting evaluation by hematology. Elevated LDH level noted, awaiting haptoglobin level as well as labs pending for ADAMTS 13 activity. She did not have any preceding diarrheal illness/hematochezia. Significant iron deficiency noted with iron saturation of 10% and ferritin of 14 for which I'll order intravenous  iron. 4. Hypokalemia: Evaluate for hyperaldosteronism with plasma renin to aldosterone ratio. She does not have discrepant renal size to suggest renal artery stenosis. Agree with oral replacement at this time cautiously to avoid overcorrection/hyperkalemia. 5. Hyponatremia: Appears to have chronic hyponatremia dating back to January of this year when she had  normal renal function. Not taking thiazides and currently likely compounded by acute kidney injury and free water excretion defect. Will check serum osmolality and urine osmolality. 6. Epigastric pain/abdominal pain: Concern raised regarding possible biliary sludge/cholecystitis-HIDA scan in progress.  Seleen Walter K. 02/13/2017, 6:04 PM

## 2017-02-13 NOTE — Progress Notes (Signed)
  Echocardiogram 2D Echocardiogram has been performed.  Madeline Brown M 02/13/2017, 2:05 PM

## 2017-02-13 NOTE — Progress Notes (Addendum)
PROGRESS NOTE    Madeline Brown  VXY:[id number] DOB: 1975/12/12 DOA: 02/12/2017 PCP: Madeline Brown    Brief Narrative: Madeline Brown  is a 41 y.o. female, w hypertension apparently presents due to epigastric pain for the past 2 days.  Slight nausea.   Denies fever, chills, emesis, diarrhea, brbpr, black stool, dysuria, hematuria.  Pain was "achy and sharp".  Pt took peptobismol => no relief.  Pt denies NSAIDS.  Pt denies iv contrast exposure.  Pt denies coccaine use.  IN ER, pt found to be in ARF bun/creat 32/2.70, K 2.3, Hgb 9.7,  Trop 0.38,  EKG => ST at 115, nl axis, poor R progression , no st-t changes c/w ischemia, slight u wave.   CT ABDOMEN AND PELVIS: 1. Gallbladder sludge and possible cholecystitis though, ultrasound would be more sensitive. 2. Nonobstructing 4 mm RIGHT nephrolithiasis. 3. 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   Assessment & Plan:   Principal Problem:   ARF (acute renal failure) (HCC) Active Problems:   Anemia   Hypokalemia   Elevated troponin   Hyponatremia   Tachycardia  ARF; could be related to HTN uncontrolled.  Continue with IV fluids.  Check renal US.  Strict I and O. Follow UA.   Anemia; Thrombocytopenia;  -anemia panel pending.  -anemia since January with hb at 10.  -thrombocytopenia new finding.  -follow smear, and LDH. No fever, follow smear to rule out TTP Addendum;  Peripheral smear with schistocytes. Discussed with Dr Burr Medico. Will get haptoglobin, DIC panel, Nephrology consultation. If confirmed, patient might need plasma exchange. Patient will need central line.    Epigastric pain;  LFT normal. Mildly elevation of bilirubin.  CT abdomen; Gallbladder sludge and possible cholecystitis though, ultrasound would be more sensitive. Nonobstructing 4 mm RIGHT nephrolithiasis. [place]; Gallbladder sludge. No visible calculi. Normal bile ducts. Negative sonographic Murphy sign. Coarsened hepatic parenchymal echotexture could  represent fatty infiltration or hepatocellular disease. -Lipase normal.  -IV protonix.  Mild tenderness RUQ, will check HIDA scan.   Hypokalemia; received IV KCL.  Await B-met for the morning.   Mild Hyponatremia;  In setting renal failure. IV fluids.   Increase troponin,  EKG; sinus tachycardia.  Continue to cycle.  Multifactorial, related to renal failure, HTN.  ECHO( ordered.   HTN; Malignant  She was on cardine Gtt.  Now on Hydralazine TID. Will increase Hydralazine.  Could start Norvasc if BP continue to be elevated.  Coreg.   Fatty infiltration of liver or hepatocellular diseases. -Mildly increase bili.  -Recommended, low fat diet, weight lost. Advised to stop drinking alcohol.  -check hepatitis panel.  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 Patient aware. Needs to follow with [facility]   DVT prophylaxis: SCD. Due to thrombocytopenia.  Code Status:  Full code.  Family Communication: no family at bedside.  Disposition Plan: [facility] in the hospital. Transfer to [facility] in case required renal consultation, HD   Consultants:   Cardiology    Procedures:  [place]; Gallbladder sludge. No visible calculi. Normal bile ducts. Negative sonographic Murphy sign. Coarsened hepatic parenchymal echotexture could represent fatty  infiltration or hepatocellular disease.   Antimicrobials: none   Subjective: She denies chest pain, headaches. No fever.  No more abdominal pain.    Objective: Vitals:   02/13/17 0200 02/13/17 0300 02/13/17 0315 02/13/17 0543  BP: (!) 174/117 (!) 181/123 (!) 188/121 (!) 174/124  Pulse:   (!) 118 (!) 118  Resp: (!) _0 (!) 23  Temp:  TempSrc:      SpO2: 100%  100% 97%  Weight:      Height:        Intake/Output Summary (Last 24 hours) at 02/13/17 0735 Last data filed at 02/12/17 2252  Gross Brown 24 hour  Intake              200 ml  Output                0 ml  Net              200 ml   Filed Weights   02/12/17 1836    Weight: 81.6 kg (180 lb)    Examination:  General exam: Appears calm and comfortable  Respiratory system: Clear to auscultation. Respiratory effort normal. Cardiovascular system: S1 & S2 heard, RRR. No JVD, murmurs, rubs, gallops or clicks. No pedal edema. Gastrointestinal system: Abdomen is mild tenderness right Upper Quadrant. , soft and nontender. No organomegaly or masses felt. Normal bowel sounds heard. Central nervous system: Alert and oriented. No focal neurological deficits. Extremities: Symmetric 5 x 5 power. Skin: No rashes, lesions or ulcers Psychiatry: Judgement and insight appear normal. Mood & affect appropriate.     Data Reviewed: I have personally reviewed following labs and imaging studies  CBC:  Recent Labs Lab 02/12/17 1856  WBC 8.8  HGB 9.7*  HCT 32.7*  MCV 72.5*  PLT 68*   Basic Metabolic Panel:  Recent Labs Lab 02/12/17 1856  NA 134*  K 2.3*  CL 99*  CO2 23  GLUCOSE 118*  BUN 32*  CREATININE 2.70*  CALCIUM 9.2   GFR: Estimated Creatinine Clearance: 26.2 mL/min (A) (by C-G formula based on SCr of 2.7 mg/dL (H)). Liver Function Tests:  Recent Labs Lab 02/12/17 2032  AST 38  ALT 16  ALKPHOS 53  BILITOT 1.3*  PROT 7.3  ALBUMIN 3.6    Recent Labs Lab 02/12/17 2032  LIPASE 39   No results for input(s): AMMONIA in the last 168 hours. Coagulation Profile: No results for input(s): INR, PROTIME in the last 168 hours. Cardiac Enzymes: No results for input(s): CKTOTAL, CKMB, CKMBINDEX, TROPONINI in the last 168 hours. BNP (last 3 results) No results for input(s): PROBNP in the last 8760 hours. HbA1C: No results for input(s): HGBA1C in the last 72 hours. CBG: No results for input(s): GLUCAP in the last 168 hours. Lipid Profile: No results for input(s): CHOL, HDL, LDLCALC, TRIG, CHOLHDL, LDLDIRECT in the last 72 hours. Thyroid Function Tests: No results for input(s): TSH, T4TOTAL, FREET4, T3FREE, THYROIDAB in the last 72  hours. Anemia Panel: No results for input(s): VITAMINB12, FOLATE, FERRITIN, TIBC, IRON, RETICCTPCT in the last 72 hours. Sepsis Labs:  Recent Labs Lab 02/12/17 2056  LATICACIDVEN 1.13    No results found for this or any previous visit (from the past 240 hour(s)).       Radiology Studies: Ct Abdomen Pelvis Wo Contrast  Result Date: 02/12/2017 CLINICAL DATA:  Abdominal pain, tender to palpation. Chest pressure for 4 days. Evaluate potential pleural effusion or pleurisy. History of hypertension. EXAM: CT CHEST, ABDOMEN AND PELVIS WITHOUT CONTRAST TECHNIQUE: Multidetector CT imaging of the chest, abdomen and pelvis was performed following the standard protocol without IV contrast. COMPARISON:  None. FINDINGS: CT CHEST FINDINGS CARDIOVASCULAR: The heart is mildly enlarged. Small pericardial effusion. MEDIASTINUM/NODES: No mediastinal mass. No lymphadenopathy by CT size criteria limited assessment without contrast. Subcentimeter anterior mediastinal lymph node. Thoracic aorta is normal course and caliber. Normal  appearance of thoracic esophagus though not tailored for evaluation. LUNGS/PLEURA: Tracheobronchial tree is patent, no pneumothorax. No pleural effusions, focal consolidations, pulmonary nodules or masses. Lingular atelectasis. Minimal dependent atelectasis. MUSCULOSKELETAL: Included soft tissues and included osseous structures appear normal. CT ABDOMEN PELVIS FINDINGS HEPATOBILIARY: Dense gallbladder sludge with mild suspected wall thickening. No biliary dilatation. Minimal focal fatty infiltration about the falciform ligament, liver is otherwise unremarkable. PANCREAS: Normal. SPLEEN: Normal. ADRENALS/URINARY TRACT: Kidneys are orthotopic, demonstrating normal size and morphology. 4 mm RIGHT lower pole nephrolithiasis. Hydronephrosis; limited assessment for renal masses on this nonenhanced examination. The unopacified ureters are normal in course and caliber. Urinary bladder is partially  distended and unremarkable. Normal adrenal glands. STOMACH/BOWEL: The stomach, small and large bowel are normal in course and caliber without inflammatory changes, sensitivity decreased by lack of enteric contrast. Mild amount of retained large bowel stool. Mild colonic diverticulosis. Normal appendix. VASCULAR/LYMPHATIC: Aortoiliac vessels are normal in course and caliber. Mild calcific atherosclerosis. No lymphadenopathy by CT size criteria. REPRODUCTIVE: Enlarged homogeneous uterus with lobulated contour. OTHER: No intraperitoneal free fluid or free air. MUSCULOSKELETAL: Non-acute. Small fat containing inguinal hernias. Small fat containing umbilical hernia. Anterior pelvic wall scarring. Mild S-type scoliosis. IMPRESSION: CT CHEST: 1.  Mild cardiomegaly and small pericardial effusion. 2. No acute pulmonary process. CT ABDOMEN AND PELVIS: 1. Gallbladder sludge and possible cholecystitis though, ultrasound would be more sensitive. 2. Nonobstructing 4 mm RIGHT nephrolithiasis. 3. 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 Electronically Signed   By: Elon Alas M.D.   On: 02/12/2017 22:08   Dg Chest 2 View  Result Date: 02/12/2017 CLINICAL DATA:  Five day history of chest pressure. Current smoker. Current history of hypertension. EXAM: CHEST  2 VIEW COMPARISON:  None. FINDINGS: Cardiac silhouette moderately to markedly enlarged. Thoracic aorta mildly tortuous. Hilar and mediastinal contours otherwise unremarkable. Lungs clear. Bronchovascular markings normal. Pulmonary vascularity normal. No visible pleural effusions. No pneumothorax. Mild thoracic dextroscoliosis and thoracolumbar levoscoliosis. IMPRESSION: 1.  No acute cardiopulmonary disease. 2. Moderate to marked cardiomegaly. Electronically Signed   By: Evangeline Dakin M.D.   On: 02/12/2017 19:10   Ct Chest Wo Contrast  Result Date: 02/12/2017 CLINICAL DATA:  Abdominal pain, tender to palpation. Chest pressure for 4 days. Evaluate  potential pleural effusion or pleurisy. History of hypertension. EXAM: CT CHEST, ABDOMEN AND PELVIS WITHOUT CONTRAST TECHNIQUE: Multidetector CT imaging of the chest, abdomen and pelvis was performed following the standard protocol without IV contrast. COMPARISON:  None. FINDINGS: CT CHEST FINDINGS CARDIOVASCULAR: The heart is mildly enlarged. Small pericardial effusion. MEDIASTINUM/NODES: No mediastinal mass. No lymphadenopathy by CT size criteria limited assessment without contrast. Subcentimeter anterior mediastinal lymph node. Thoracic aorta is normal course and caliber. Normal appearance of thoracic esophagus though not tailored for evaluation. LUNGS/PLEURA: Tracheobronchial tree is patent, no pneumothorax. No pleural effusions, focal consolidations, pulmonary nodules or masses. Lingular atelectasis. Minimal dependent atelectasis. MUSCULOSKELETAL: Included soft tissues and included osseous structures appear normal. CT ABDOMEN PELVIS FINDINGS HEPATOBILIARY: Dense gallbladder sludge with mild suspected wall thickening. No biliary dilatation. Minimal focal fatty infiltration about the falciform ligament, liver is otherwise unremarkable. PANCREAS: Normal. SPLEEN: Normal. ADRENALS/URINARY TRACT: Kidneys are orthotopic, demonstrating normal size and morphology. 4 mm RIGHT lower pole nephrolithiasis. Hydronephrosis; limited assessment for renal masses on this nonenhanced examination. The unopacified ureters are normal in course and caliber. Urinary bladder is partially distended and unremarkable. Normal adrenal glands. STOMACH/BOWEL: The stomach, small and large bowel are normal in course and caliber without inflammatory changes, sensitivity decreased by lack  of enteric contrast. Mild amount of retained large bowel stool. Mild colonic diverticulosis. Normal appendix. VASCULAR/LYMPHATIC: Aortoiliac vessels are normal in course and caliber. Mild calcific atherosclerosis. No lymphadenopathy by CT size criteria.  REPRODUCTIVE: Enlarged homogeneous uterus with lobulated contour. OTHER: No intraperitoneal free fluid or free air. MUSCULOSKELETAL: Non-acute. Small fat containing inguinal hernias. Small fat containing umbilical hernia. Anterior pelvic wall scarring. Mild S-type scoliosis. IMPRESSION: CT CHEST: 1.  Mild cardiomegaly and small pericardial effusion. 2. No acute pulmonary process. CT ABDOMEN AND PELVIS: 1. Gallbladder sludge and possible cholecystitis though, ultrasound would be more sensitive. 2. Nonobstructing 4 mm RIGHT nephrolithiasis. 3. 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 Electronically Signed   By: Elon Alas M.D.   On: 02/12/2017 22:08   US Abdomen Limited Ruq  Result Date: 02/13/2017 CLINICAL DATA:  Epigastric pain tonight. EXAM: ULTRASOUND ABDOMEN LIMITED RIGHT UPPER QUADRANT COMPARISON:  CT 02/12/2017 FINDINGS: Gallbladder: Gallbladder sludge. Mild mural thickening. No pericholecystic fluid. No tenderness to probe pressure over the gallbladder. Common bile duct: Diameter: Normal, 2.7 mm. Liver: Generalized coarsening of hepatic echotexture without focal lesion. Portal vein is patent on color Doppler imaging with normal direction of blood flow towards the liver. IMPRESSION: 1. Gallbladder sludge. No visible calculi. Normal bile ducts. Negative sonographic Murphy sign. 2. Coarsened hepatic parenchymal echotexture could represent fatty infiltration or hepatocellular disease. Electronically Signed   By: Andreas Newport M.D.   On: 02/13/2017 00:43        Scheduled Meds: . carvedilol  25 mg Oral BID WC  . hydrALAZINE  50 mg Oral Q8H   Continuous Infusions:   LOS: 1 day    Time spent: 35 minutes.     Elmarie Shiley, MD [facility] Pager [phone]  If 7PM-7AM, please contact night-coverage [email or web address] Password [facility] 02/13/2017, 7:35 AM

## 2017-02-13 NOTE — ED Notes (Signed)
Date and time results received: 02/13/17 1048     Test: Troponin Critical Value: 0.39

## 2017-02-13 NOTE — ED Notes (Signed)
Awaiting another dose of POTASSIUM CHLORIDE from main pharmacy.

## 2017-02-13 NOTE — ED Notes (Addendum)
Pt told to remain NPO for scheduled test. Pt was found eating breakfast and stated that someone brought in tray so she assumed it was ok to eat. Nuclear medicine called and test rescheduled at this time. Pt instructed to remain NPO after completing breakfast for 6hrs prior to test. Will continue to monitor.

## 2017-02-13 NOTE — ED Notes (Signed)
Bed: WS39 Expected date:  Expected time:  Means of arrival:  Comments: Room 2

## 2017-02-13 NOTE — Progress Notes (Signed)
02/13/2017 1630 Received pt to [id number] from [facility].  Pt is A&O, no C/O voiced.  Tele monitor applied and CCMD notified.  Oriented to room, call light and bed.  Call bell in reach. [place]

## 2017-02-13 NOTE — Consult Note (Signed)
[place]  Telephone:(336) [place]   Zunaira Lamy  DOB: 1976/05/20  MR#: [id number]  CSN#: [id number]    Requesting Physician: [facility]  Patient Care Team: Patient, No Pcp Per as PCP - General (General Practice)  Reason for consult: anemia and thrombocytopenia   History of present illness: This is a 41 year old African-American woman with past history of point hypertension, obesity, smoker, who presented to hospital with epigastric pain for 5 days. She was found to have significantly elevated that patient has high as 217/170, and acute renal failure with creatinine 2.7 (baseline 0.7), positive troponin, anemia with hemoglobin 9.7 (2.1 one year ago, and 10.4 8 months ago) was no MCV, moderate thrombocytopenia with plt 68K (normal 228K 8 months ago), normal WBC. S meal was reviewed by pathologist to [facility] today, which showed numerous schistocytes. As a hemolysis labs are pending. His total bilirubin was 1.3 with intact ability 1.1, LDH elevated at 682. She was transferred from [facility] to [facility] for further management. Patient's BP has remained to be significantly since her admission.   MEDICAL HISTORY:  Past Medical History:  Diagnosis Date  . Hypertension     SURGICAL HISTORY: Past Surgical History:  Procedure Laterality Date  . CESAREAN SECTION     x2    SOCIAL HISTORY: Social History   Social History  . Marital status: Single    Spouse name: N/A  . Number of children: N/A  . Years of education: N/A   Occupational History  . Not on file.   Social History Main Topics  . Smoking status: Current Every Day Smoker    Packs/day: 0.25    Years: 15.00    Types: Cigarettes  . Smokeless tobacco: Never Used  . Alcohol use Yes     Comment: occassional beer  . Drug use: Yes    Types: Marijuana  . Sexual activity: Yes    Birth control/ protection: None   Other Topics Concern  . Not on file   Social History  Narrative  . No narrative on file    FAMILY HISTORY: Family History  Problem Relation Age of Onset  . Diabetes Father   . Hypertension Father   . Cancer Father     ALLERGIES:  is allergic to visine [tetrahydrozoline hcl].  MEDICATIONS:  Current Facility-Administered Medications  Medication Dose Route Frequency Provider Last Rate Last Dose  . 0.9 %  sodium chloride infusion   Intravenous Continuous Regalado, Belkys A, MD 75 mL/hr at 02/13/17 0835    . acetaminophen (TYLENOL) tablet 650 mg  650 mg Oral Q6H PRN Jani Gravel, MD       Or  . acetaminophen (TYLENOL) suppository 650 mg  650 mg Rectal Q6H PRN Jani Gravel, MD      . carvedilol (COREG) tablet 25 mg  25 mg Oral BID WC Jani Gravel, MD   25 mg at 02/13/17 1637  . cefTRIAXone (ROCEPHIN) 1 g in dextrose 5 % 50 mL IVPB  1 g Intravenous Q24H Regalado, Belkys A, MD 100 mL/hr at 02/13/17 1513 1 g at 02/13/17 1513  . [START ON 02/14/2017] ferumoxytol (FERAHEME) 510 mg in sodium chloride 0.9 % 100 mL IVPB  510 mg Intravenous Once Elmarie Shiley, MD      . hydrALAZINE (APRESOLINE) injection 10 mg  10 mg Intravenous Q6H PRN Jani Gravel, MD   10 mg at 02/13/17 0815  . hydrALAZINE (APRESOLINE) tablet 100 mg  100 mg Oral Q8H Regalado,  Belkys A, MD   100 mg at 02/13/17 1450  . morphine 4 MG/ML injection 4 mg  4 mg Intravenous Q1H PRN Tanna Furry, MD   4 mg at 02/13/17 0301  . NIFEdipine (PROCARDIA-XL/ADALAT-CC/NIFEDICAL-XL) 24 hr tablet 60 mg  60 mg Oral QHS Elmarie Shiley, MD        REVIEW OF SYSTEMS:   Constitutional: Denies fevers, chills or abnormal night sweats Eyes: Denies blurriness of vision, double vision or watery eyes Ears, nose, mouth, throat, and face: Denies mucositis or sore throat Respiratory: Denies cough, dyspnea or wheezes Cardiovascular: Denies palpitation, chest discomfort or lower extremity swelling Gastrointestinal:  Denies nausea, heartburn or change in bowel habits Skin: Denies abnormal skin rashes Lymphatics: Denies new  lymphadenopathy or easy bruising Neurological:Denies numbness, tingling or new weaknesses Behavioral/Psych: Mood is stable, no new changes  All other systems were reviewed with the patient and are negative.  PHYSICAL EXAMINATION:  Vitals:   02/13/17 1633 02/13/17 1637  BP:  (!) 167/113  Pulse:  (!) 106  Resp:    Temp: 99.1 F (37.3 C)   SpO2:     Filed Weights   02/12/17 1836  Weight: 180 lb (81.6 kg)    GENERAL:alert, no distress and comfortable SKIN: skin color, texture, turgor are normal, no rashes or significant lesions EYES: normal, conjunctiva are pink and non-injected, sclera clear OROPHARYNX:no exudate, no erythema and lips, buccal mucosa, and tongue normal  NECK: supple, thyroid normal size, non-tender, without nodularity LYMPH:  no palpable lymphadenopathy in the cervical, axillary or inguinal LUNGS: clear to auscultation and percussion with normal breathing effort HEART: regular rate & rhythm and no murmurs and no lower extremity edema ABDOMEN:abdomen soft, mild epigastric pain, normal bowel sounds Musculoskeletal:no cyanosis of digits and no clubbing  PSYCH: alert & oriented x 3 with fluent speech NEURO: no focal motor/sensory deficits  LABORATORY DATA:  I have reviewed the data as listed Lab Results  Component Value Date   WBC 8.5 02/13/2017   HGB 8.1 (L) 02/13/2017   HCT 27.6 (L) 02/13/2017   MCV 73.2 (L) 02/13/2017   PLT 63 (L) 02/13/2017    Recent Labs  06/17/16 1622 02/12/17 1856 02/12/17 2032 02/13/17 0820 02/13/17 0827  NA 132* 134*  --   --  134*  K 3.4* 2.3*  --   --  2.9*  CL 105 99*  --   --  101  CO2 18* 23  --   --  22  GLUCOSE 88 118*  --   --  107*  BUN 8 32*  --   --  32*  CREATININE 0.72 2.70*  --   --  2.63*  CALCIUM 8.9 9.2  --   --  8.5*  GFRNONAA >60 21*  --   --  22*  GFRAA >60 24*  --   --  25*  PROT  --   --  7.3 6.7  --   ALBUMIN  --   --  3.6 3.2*  --   AST  --   --  38 30  --   ALT  --   --  16 14  --   ALKPHOS   --   --  53 48  --   BILITOT  --   --  1.3* 1.1  --   BILIDIR  --   --  0.2 0.2  --   IBILI  --   --  1.1* 0.9  --     RADIOGRAPHIC STUDIES:  I have personally reviewed the radiological images as listed and agreed with the findings in the report. Ct Abdomen Pelvis Wo Contrast  Result Date: 02/12/2017 CLINICAL DATA:  Abdominal pain, tender to palpation. Chest pressure for 4 days. Evaluate potential pleural effusion or pleurisy. History of hypertension. EXAM: CT CHEST, ABDOMEN AND PELVIS WITHOUT CONTRAST TECHNIQUE: Multidetector CT imaging of the chest, abdomen and pelvis was performed following the standard protocol without IV contrast. COMPARISON:  None. FINDINGS: CT CHEST FINDINGS CARDIOVASCULAR: The heart is mildly enlarged. Small pericardial effusion. MEDIASTINUM/NODES: No mediastinal mass. No lymphadenopathy by CT size criteria limited assessment without contrast. Subcentimeter anterior mediastinal lymph node. Thoracic aorta is normal course and caliber. Normal appearance of thoracic esophagus though not tailored for evaluation. LUNGS/PLEURA: Tracheobronchial tree is patent, no pneumothorax. No pleural effusions, focal consolidations, pulmonary nodules or masses. Lingular atelectasis. Minimal dependent atelectasis. MUSCULOSKELETAL: Included soft tissues and included osseous structures appear normal. CT ABDOMEN PELVIS FINDINGS HEPATOBILIARY: Dense gallbladder sludge with mild suspected wall thickening. No biliary dilatation. Minimal focal fatty infiltration about the falciform ligament, liver is otherwise unremarkable. PANCREAS: Normal. SPLEEN: Normal. ADRENALS/URINARY TRACT: Kidneys are orthotopic, demonstrating normal size and morphology. 4 mm RIGHT lower pole nephrolithiasis. Hydronephrosis; limited assessment for renal masses on this nonenhanced examination. The unopacified ureters are normal in course and caliber. Urinary bladder is partially distended and unremarkable. Normal adrenal glands.  STOMACH/BOWEL: The stomach, small and large bowel are normal in course and caliber without inflammatory changes, sensitivity decreased by lack of enteric contrast. Mild amount of retained large bowel stool. Mild colonic diverticulosis. Normal appendix. VASCULAR/LYMPHATIC: Aortoiliac vessels are normal in course and caliber. Mild calcific atherosclerosis. No lymphadenopathy by CT size criteria. REPRODUCTIVE: Enlarged homogeneous uterus with lobulated contour. OTHER: No intraperitoneal free fluid or free air. MUSCULOSKELETAL: Non-acute. Small fat containing inguinal hernias. Small fat containing umbilical hernia. Anterior pelvic wall scarring. Mild S-type scoliosis. IMPRESSION: CT CHEST: 1.  Mild cardiomegaly and small pericardial effusion. 2. No acute pulmonary process. CT ABDOMEN AND PELVIS: 1. Gallbladder sludge and possible cholecystitis though, ultrasound would be more sensitive. 2. Nonobstructing 4 mm RIGHT nephrolithiasis. 3. 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 Electronically Signed   By: Elon Alas M.D.   On: 02/12/2017 22:08   Dg Chest 2 View  Result Date: 02/12/2017 CLINICAL DATA:  Five day history of chest pressure. Current smoker. Current history of hypertension. EXAM: CHEST  2 VIEW COMPARISON:  None. FINDINGS: Cardiac silhouette moderately to markedly enlarged. Thoracic aorta mildly tortuous. Hilar and mediastinal contours otherwise unremarkable. Lungs clear. Bronchovascular markings normal. Pulmonary vascularity normal. No visible pleural effusions. No pneumothorax. Mild thoracic dextroscoliosis and thoracolumbar levoscoliosis. IMPRESSION: 1.  No acute cardiopulmonary disease. 2. Moderate to marked cardiomegaly. Electronically Signed   By: Evangeline Dakin M.D.   On: 02/12/2017 19:10   Ct Chest Wo Contrast  Result Date: 02/12/2017 CLINICAL DATA:  Abdominal pain, tender to palpation. Chest pressure for 4 days. Evaluate potential pleural effusion or pleurisy. History of  hypertension. EXAM: CT CHEST, ABDOMEN AND PELVIS WITHOUT CONTRAST TECHNIQUE: Multidetector CT imaging of the chest, abdomen and pelvis was performed following the standard protocol without IV contrast. COMPARISON:  None. FINDINGS: CT CHEST FINDINGS CARDIOVASCULAR: The heart is mildly enlarged. Small pericardial effusion. MEDIASTINUM/NODES: No mediastinal mass. No lymphadenopathy by CT size criteria limited assessment without contrast. Subcentimeter anterior mediastinal lymph node. Thoracic aorta is normal course and caliber. Normal appearance of thoracic esophagus though not tailored for evaluation. LUNGS/PLEURA: Tracheobronchial tree is patent, no pneumothorax. No pleural effusions,  focal consolidations, pulmonary nodules or masses. Lingular atelectasis. Minimal dependent atelectasis. MUSCULOSKELETAL: Included soft tissues and included osseous structures appear normal. CT ABDOMEN PELVIS FINDINGS HEPATOBILIARY: Dense gallbladder sludge with mild suspected wall thickening. No biliary dilatation. Minimal focal fatty infiltration about the falciform ligament, liver is otherwise unremarkable. PANCREAS: Normal. SPLEEN: Normal. ADRENALS/URINARY TRACT: Kidneys are orthotopic, demonstrating normal size and morphology. 4 mm RIGHT lower pole nephrolithiasis. Hydronephrosis; limited assessment for renal masses on this nonenhanced examination. The unopacified ureters are normal in course and caliber. Urinary bladder is partially distended and unremarkable. Normal adrenal glands. STOMACH/BOWEL: The stomach, small and large bowel are normal in course and caliber without inflammatory changes, sensitivity decreased by lack of enteric contrast. Mild amount of retained large bowel stool. Mild colonic diverticulosis. Normal appendix. VASCULAR/LYMPHATIC: Aortoiliac vessels are normal in course and caliber. Mild calcific atherosclerosis. No lymphadenopathy by CT size criteria. REPRODUCTIVE: Enlarged homogeneous uterus with lobulated  contour. OTHER: No intraperitoneal free fluid or free air. MUSCULOSKELETAL: Non-acute. Small fat containing inguinal hernias. Small fat containing umbilical hernia. Anterior pelvic wall scarring. Mild S-type scoliosis. IMPRESSION: CT CHEST: 1.  Mild cardiomegaly and small pericardial effusion. 2. No acute pulmonary process. CT ABDOMEN AND PELVIS: 1. Gallbladder sludge and possible cholecystitis though, ultrasound would be more sensitive. 2. Nonobstructing 4 mm RIGHT nephrolithiasis. 3. 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 Electronically Signed   By: Elon Alas M.D.   On: 02/12/2017 22:08   US Renal  Result Date: 02/13/2017 CLINICAL DATA:  Acute kidney injury EXAM: RENAL / URINARY TRACT ULTRASOUND COMPLETE COMPARISON:  None. FINDINGS: Right Kidney: Length: 10.5 cm. Increased renal cortical echogenicity. 10 mm nonobstructing right renal calculus. No mass or hydronephrosis visualized. Left Kidney: Length: 9.6 cm. Increased renal cortical echogenicity. No mass or hydronephrosis visualized. Bladder: Appears normal for degree of bladder distention. IMPRESSION: 1. No obstructive uropathy. 2. Increased bilateral renal cortical echogenicity as can be seen with medical renal disease or acute kidney injury. Electronically Signed   By: Kathreen Devoid   On: 02/13/2017 09:04   US Abdomen Limited Ruq  Result Date: 02/13/2017 CLINICAL DATA:  Epigastric pain tonight. EXAM: ULTRASOUND ABDOMEN LIMITED RIGHT UPPER QUADRANT COMPARISON:  CT 02/12/2017 FINDINGS: Gallbladder: Gallbladder sludge. Mild mural thickening. No pericholecystic fluid. No tenderness to probe pressure over the gallbladder. Common bile duct: Diameter: Normal, 2.7 mm. Liver: Generalized coarsening of hepatic echotexture without focal lesion. Portal vein is patent on color Doppler imaging with normal direction of blood flow towards the liver. IMPRESSION: 1. Gallbladder sludge. No visible calculi. Normal bile ducts. Negative sonographic  Murphy sign. 2. Coarsened hepatic parenchymal echotexture could represent fatty infiltration or hepatocellular disease. Electronically Signed   By: Andreas Newport M.D.   On: 02/13/2017 00:43    ASSESSMENT & PLAN:  41 year old African-American female, with past medical history of hypertension, presented with epigastric pain, was found to have a significantly elevated blood pressure, positive troponin, acute renal hernia, worsening anemia and moderate some osteopenia.  1. Hemolytic anemia and thrombocytopenia  2. AKI  3. Hypertension emergency 4. Positive troponin, likely demanding ischemia from hypertension and anemia  5. 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 Epigastric pain, possible cholecystitis 7. 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  -DIC is also a possibility, lab has been ordered to rule out DIC  -certain TTP and HUS is on the deferential, although I feel it's less likely, given the lack of neurologic symptoms, fever, moderate some cytopenia, and known hypertension emergency. She does not have a history of diarrhea lately. I have ordered [id number], although the result takes about a week to return  -she also has component of iron deficient anemia, with low ferritin and low transferrin saturation. Dr. Posey Pronto has ordered IV iron. -I will wait for the above lab result, see her back tomorrow morning. I will hold on plasma exchange for now.    All questions were answered.      Truitt Merle, MD 02/13/2017 7:55 PM

## 2017-02-13 NOTE — Consult Note (Signed)
Cardiology Consultation:   Patient ID: Madeline Brown; [id number]; April 09, 1976   Admit date: 02/12/2017 Date of Consult: 02/13/2017  Primary Care Provider: Patient, No Pcp Per Primary Cardiologist: New  Primary Electrophysiologist:  N/A   Patient Profile:   Madeline Brown is a 41 y.o. female with a hx of HTN and tobacco abuse, who presented to ED w/ CC of epigastric pain and nausea, found to be in acute renal failure with SCr at 2.7 (basline ~0.70), hypokalemia at 2.3, anemia with  Hgb of 9, thrombocytopenia with platelets at 68 with abnormal troponin's x 2 (0.38>>0.44) for which cardiology has been consulted, at the request of Dr. Tyrell Antonio, Internal Medicine. Pt to be transferred to [facility] later today for further w/u and treatment given her multiple issues.   History of Present Illness:   As outlined above, her only significant PMH includes HTN and tobacco use. She has been a smoker for 22 years since the age of 52. No h/o cardiac disease. She denies any known h/o DM or HLD. She has had epigastric pain off and on  x 5 days w/ nausea. She had some radiation up in to her sternal area on Sunday. Felt like pressure. Exacerbated in the supine position and improved sitting up. She had mild associated dyspnea. Her SSCP and dyspnea only lasted ~1 day. No recurrence since Sunday. Her abdominal pain however persisted. No exertional symptom. No vomiting, diarrhea, fever or chills. In ED, SCr up at 2.7. Last SCr 06/2016 was 0.7. She is also hypokalemic with K of 2.3 and anemic with Hgb at 9.7. Platelets low at 68. Initial POC troponin abnormal at 0.38. Repeat POC troponin 0.44. EKG with sinus tach at 115 bpm but no ST changes. She is hypertensive. 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  She is being admitted by IM. Supplemental K given. Mg lab pending. IVFs for ARF. Renal w/u  pending. Hematologic w/u pending. Iron studies pending for anemia w/u. Also with plans for HIDA scan. Cards consulted for elevated troponin. 2D echo pending.   She is currently pain free at rest but has recurrent abdominal discomfort with palpation of the abdomen.   Past Medical History:  Diagnosis Date  . Hypertension     Past Surgical History:  Procedure Laterality Date  . CESAREAN SECTION     x2     Home Medications:  Prior to Admission medications   Medication Sig Start Date End Date Taking? Authorizing Provider  Aspirin-Acetaminophen-Caffeine (GOODY HEADACHE PO) Take 1 packet by mouth daily as needed. For cramping   Yes [provider]  labetalol (NORMODYNE) 200 MG tablet Take 2 tablets (400 mg total) by mouth 3 (three) times daily. Patient not taking: Reported on 02/12/2017 06/17/16   Forde Dandy, MD    Inpatient Medications: Scheduled Meds: . carvedilol  25 mg Oral BID WC  . hydrALAZINE  50 mg Oral Q8H   Continuous Infusions:  PRN Meds: hydrALAZINE, morphine injection  Allergies:    Allergies  Allergen Reactions  . Visine [Tetrahydrozoline Hcl] Other (See Comments)    Eyes Swelling.    Social History:   Social History   Social History  . Marital status: Single    Spouse name: N/A  . Number of children: N/A  . Years of education: N/A   Occupational History  . Not on file.   Social History Main Topics  . Smoking status: Current  Every Day Smoker    Packs/day: 0.25    Years: 15.00    Types: Cigarettes  . Smokeless tobacco: Never Used  . Alcohol use Yes     Comment: occassional beer  . Drug use: Yes    Types: Marijuana  . Sexual activity: Yes    Birth control/ protection: None   Other Topics Concern  . Not on file   Social History Narrative  . No narrative on file    Family History:    Family History  Problem Relation Age of Onset  . Diabetes Father   . Hypertension Father   . Cancer Father      ROS:  Please see the history of  present illness.  Review of Systems  Constitution: Negative for chills and fever.  Cardiovascular: Positive for chest pain (resolved, occured 5 days ago).  Gastrointestinal: Positive for abdominal pain and nausea. Negative for vomiting.    All other ROS reviewed and negative.     Physical Exam/Data:   Vitals:   02/13/17 0200 02/13/17 0300 02/13/17 0315 02/13/17 0543  BP: (!) 174/117 (!) 181/123 (!) 188/121 (!) 174/124  Pulse:   (!) 118 (!) 118  Resp: (!) 24 14 12  (!) 23  Temp:      TempSrc:      SpO2: 100%  100% 97%  Weight:      Height:        Intake/Output Summary (Last 24 hours) at 02/13/17 0741 Last data filed at 02/12/17 2252  Gross per 24 hour  Intake              200 ml  Output                0 ml  Net              200 ml   Filed Weights   02/12/17 1836  Weight: 180 lb (81.6 kg)   Body mass index is 35.15 kg/m.  General:  Well nourished, well developed, in no acute distress, moderately obese  HEENT: normal Lymph: no adenopathy Neck: no JVD Endocrine:  No thryomegaly Vascular: No carotid bruits; FA pulses 2+ bilaterally without bruits  Cardiac:   RR, tachy rate; no murmur  Lungs:  clear to auscultation bilaterally, no wheezing, rhonchi or rales  Abd: obese soft, mildly tender RLQ, no hepatomegaly  Ext: no edema Musculoskeletal:  No deformities, BUE and BLE strength normal and equal Skin: warm and dry  Neuro:  CNs 2-12 intact, no focal abnormalities noted Psych:  Normal affect   EKG:  The EKG was personally reviewed and demonstrates:  Sinus tach Telemetry:  Telemetry was personally reviewed and demonstrates:  Sinus tach 115 bpm. No ST changes.   Relevant CV Studies: 2D echo pending   Laboratory Data:  Chemistry Recent Labs Lab 02/12/17 1856  NA 134*  K 2.3*  CL 99*  CO2 23  GLUCOSE 118*  BUN 32*  CREATININE 2.70*  CALCIUM 9.2  GFRNONAA 21*  GFRAA 24*  ANIONGAP 12     Recent Labs Lab 02/12/17 2032  PROT 7.3  ALBUMIN 3.6  AST 38  ALT  16  ALKPHOS 53  BILITOT 1.3*   Hematology Recent Labs Lab 02/12/17 1856  WBC 8.8  RBC 4.51  HGB 9.7*  HCT 32.7*  MCV 72.5*  MCH 21.5*  MCHC 29.7*  RDW 21.0*  PLT 68*   Cardiac EnzymesNo results for input(s): TROPONINI in the last 168 hours.  Recent Labs Lab 02/12/17  1910 02/12/17 2053  TROPIPOC 0.44* 0.38*    BNPNo results for input(s): BNP, PROBNP in the last 168 hours.  DDimer No results for input(s): DDIMER in the last 168 hours.  Radiology/Studies:  Ct Abdomen Pelvis Wo Contrast  Result Date: 02/12/2017 CLINICAL DATA:  Abdominal pain, tender to palpation. Chest pressure for 4 days. Evaluate potential pleural effusion or pleurisy. History of hypertension. EXAM: CT CHEST, ABDOMEN AND PELVIS WITHOUT CONTRAST TECHNIQUE: Multidetector CT imaging of the chest, abdomen and pelvis was performed following the standard protocol without IV contrast. COMPARISON:  None. FINDINGS: CT CHEST FINDINGS CARDIOVASCULAR: The heart is mildly enlarged. Small pericardial effusion. MEDIASTINUM/NODES: No mediastinal mass. No lymphadenopathy by CT size criteria limited assessment without contrast. Subcentimeter anterior mediastinal lymph node. Thoracic aorta is normal course and caliber. Normal appearance of thoracic esophagus though not tailored for evaluation. LUNGS/PLEURA: Tracheobronchial tree is patent, no pneumothorax. No pleural effusions, focal consolidations, pulmonary nodules or masses. Lingular atelectasis. Minimal dependent atelectasis. MUSCULOSKELETAL: Included soft tissues and included osseous structures appear normal. CT ABDOMEN PELVIS FINDINGS HEPATOBILIARY: Dense gallbladder sludge with mild suspected wall thickening. No biliary dilatation. Minimal focal fatty infiltration about the falciform ligament, liver is otherwise unremarkable. PANCREAS: Normal. SPLEEN: Normal. ADRENALS/URINARY TRACT: Kidneys are orthotopic, demonstrating normal size and morphology. 4 mm RIGHT lower pole  nephrolithiasis. Hydronephrosis; limited assessment for renal masses on this nonenhanced examination. The unopacified ureters are normal in course and caliber. Urinary bladder is partially distended and unremarkable. Normal adrenal glands. STOMACH/BOWEL: The stomach, small and large bowel are normal in course and caliber without inflammatory changes, sensitivity decreased by lack of enteric contrast. Mild amount of retained large bowel stool. Mild colonic diverticulosis. Normal appendix. VASCULAR/LYMPHATIC: Aortoiliac vessels are normal in course and caliber. Mild calcific atherosclerosis. No lymphadenopathy by CT size criteria. REPRODUCTIVE: Enlarged homogeneous uterus with lobulated contour. OTHER: No intraperitoneal free fluid or free air. MUSCULOSKELETAL: Non-acute. Small fat containing inguinal hernias. Small fat containing umbilical hernia. Anterior pelvic wall scarring. Mild S-type scoliosis. IMPRESSION: CT CHEST: 1.  Mild cardiomegaly and small pericardial effusion. 2. No acute pulmonary process. CT ABDOMEN AND PELVIS: 1. Gallbladder sludge and possible cholecystitis though, ultrasound would be more sensitive. 2. Nonobstructing 4 mm RIGHT nephrolithiasis. 3. 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 Electronically Signed   By: Elon Alas M.D.   On: 02/12/2017 22:08   Dg Chest 2 View  Result Date: 02/12/2017 CLINICAL DATA:  Five day history of chest pressure. Current smoker. Current history of hypertension. EXAM: CHEST  2 VIEW COMPARISON:  None. FINDINGS: Cardiac silhouette moderately to markedly enlarged. Thoracic aorta mildly tortuous. Hilar and mediastinal contours otherwise unremarkable. Lungs clear. Bronchovascular markings normal. Pulmonary vascularity normal. No visible pleural effusions. No pneumothorax. Mild thoracic dextroscoliosis and thoracolumbar levoscoliosis. IMPRESSION: 1.  No acute cardiopulmonary disease. 2. Moderate to marked cardiomegaly. Electronically Signed   By:  Evangeline Dakin M.D.   On: 02/12/2017 19:10   Ct Chest Wo Contrast  Result Date: 02/12/2017 CLINICAL DATA:  Abdominal pain, tender to palpation. Chest pressure for 4 days. Evaluate potential pleural effusion or pleurisy. History of hypertension. EXAM: CT CHEST, ABDOMEN AND PELVIS WITHOUT CONTRAST TECHNIQUE: Multidetector CT imaging of the chest, abdomen and pelvis was performed following the standard protocol without IV contrast. COMPARISON:  None. FINDINGS: CT CHEST FINDINGS CARDIOVASCULAR: The heart is mildly enlarged. Small pericardial effusion. MEDIASTINUM/NODES: No mediastinal mass. No lymphadenopathy by CT size criteria limited assessment without contrast. Subcentimeter anterior mediastinal lymph node. Thoracic aorta is normal course and caliber. Normal  appearance of thoracic esophagus though not tailored for evaluation. LUNGS/PLEURA: Tracheobronchial tree is patent, no pneumothorax. No pleural effusions, focal consolidations, pulmonary nodules or masses. Lingular atelectasis. Minimal dependent atelectasis. MUSCULOSKELETAL: Included soft tissues and included osseous structures appear normal. CT ABDOMEN PELVIS FINDINGS HEPATOBILIARY: Dense gallbladder sludge with mild suspected wall thickening. No biliary dilatation. Minimal focal fatty infiltration about the falciform ligament, liver is otherwise unremarkable. PANCREAS: Normal. SPLEEN: Normal. ADRENALS/URINARY TRACT: Kidneys are orthotopic, demonstrating normal size and morphology. 4 mm RIGHT lower pole nephrolithiasis. Hydronephrosis; limited assessment for renal masses on this nonenhanced examination. The unopacified ureters are normal in course and caliber. Urinary bladder is partially distended and unremarkable. Normal adrenal glands. STOMACH/BOWEL: The stomach, small and large bowel are normal in course and caliber without inflammatory changes, sensitivity decreased by lack of enteric contrast. Mild amount of retained large bowel stool. Mild colonic  diverticulosis. Normal appendix. VASCULAR/LYMPHATIC: Aortoiliac vessels are normal in course and caliber. Mild calcific atherosclerosis. No lymphadenopathy by CT size criteria. REPRODUCTIVE: Enlarged homogeneous uterus with lobulated contour. OTHER: No intraperitoneal free fluid or free air. MUSCULOSKELETAL: Non-acute. Small fat containing inguinal hernias. Small fat containing umbilical hernia. Anterior pelvic wall scarring. Mild S-type scoliosis. IMPRESSION: CT CHEST: 1.  Mild cardiomegaly and small pericardial effusion. 2. No acute pulmonary process. CT ABDOMEN AND PELVIS: 1. Gallbladder sludge and possible cholecystitis though, ultrasound would be more sensitive. 2. Nonobstructing 4 mm RIGHT nephrolithiasis. 3. 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 Electronically Signed   By: Elon Alas M.D.   On: 02/12/2017 22:08   US Abdomen Limited Ruq  Result Date: 02/13/2017 CLINICAL DATA:  Epigastric pain tonight. EXAM: ULTRASOUND ABDOMEN LIMITED RIGHT UPPER QUADRANT COMPARISON:  CT 02/12/2017 FINDINGS: Gallbladder: Gallbladder sludge. Mild mural thickening. No pericholecystic fluid. No tenderness to probe pressure over the gallbladder. Common bile duct: Diameter: Normal, 2.7 mm. Liver: Generalized coarsening of hepatic echotexture without focal lesion. Portal vein is patent on color Doppler imaging with normal direction of blood flow towards the liver. IMPRESSION: 1. Gallbladder sludge. No visible calculi. Normal bile ducts. Negative sonographic Murphy sign. 2. Coarsened hepatic parenchymal echotexture could represent fatty infiltration or hepatocellular disease. Electronically Signed   By: Andreas Newport M.D.   On: 02/13/2017 00:43    Assessment and Plan:   1. Elevated Troponin: POC abnormal x 2 at 0.38>>0.44. Likely demand ischemia in the setting of multiple other medical issues including ARF with SCr at 2.7 (baseline <1),  Hypokalemia w/ K at 2.3, thrombocytopenia w/ platelets at 66,  anemia and HTN. Also concerns for likely GI pathology. HIDA scan pending. EKG w/o ST changes. Sinus tach on tele. Afebrile with normal WBC. Not a candidate for ischemic w/u at this time. Plan is for transfer to [facility] given other medical issues. We will follow from a distance and will reassess once other medical issues resolve. Agree with 2D echo and management of BP. Currently on Coreg and hydrazine. MD to follow with further recs.    Signed, Lyda Jester, PA-C  02/13/2017 7:41 AM   Patient examined chart reviewed. Patient has unRx HTN for over a year Moved from [place] and has 5 kids No chest pain She is tachycardic and hypertensive start labatolol She has som RUQ tenderness on exam With no rebound US GB ok HIDA ordered will order echo to make sure EF ok given mildly elevated troponin  In setting A/CRF and tachycardia to r/o effusion.   Jenkins Rouge

## 2017-02-14 ENCOUNTER — Inpatient Hospital Stay (HOSPITAL_COMMUNITY): Payer: Medicaid Other

## 2017-02-14 DIAGNOSIS — D594 Other nonautoimmune hemolytic anemias: Secondary | ICD-10-CM

## 2017-02-14 DIAGNOSIS — R1084 Generalized abdominal pain: Secondary | ICD-10-CM

## 2017-02-14 DIAGNOSIS — K819 Cholecystitis, unspecified: Secondary | ICD-10-CM

## 2017-02-14 DIAGNOSIS — R109 Unspecified abdominal pain: Secondary | ICD-10-CM

## 2017-02-14 DIAGNOSIS — R0602 Shortness of breath: Secondary | ICD-10-CM

## 2017-02-14 DIAGNOSIS — D631 Anemia in chronic kidney disease: Secondary | ICD-10-CM

## 2017-02-14 LAB — RENAL FUNCTION PANEL
ALBUMIN: 2.9 g/dL — AB (ref 3.5–5.0)
Anion gap: 10 (ref 5–15)
BUN: 38 mg/dL — ABNORMAL HIGH (ref 6–20)
CALCIUM: 8.7 mg/dL — AB (ref 8.9–10.3)
CO2: 20 mmol/L — ABNORMAL LOW (ref 22–32)
Chloride: 102 mmol/L (ref 101–111)
Creatinine, Ser: 3.13 mg/dL — ABNORMAL HIGH (ref 0.44–1.00)
GFR, EST AFRICAN AMERICAN: 20 mL/min — AB (ref >60)
GFR, EST NON AFRICAN AMERICAN: 17 mL/min — AB (ref >60)
Glucose, Bld: 133 mg/dL — ABNORMAL HIGH (ref 65–99)
PHOSPHORUS: 3.1 mg/dL (ref 2.5–4.6)
POTASSIUM: 3.5 mmol/L (ref 3.5–5.1)
SODIUM: 132 mmol/L — AB (ref 135–145)

## 2017-02-14 LAB — ADAMTS13 ACTIVITY REFLEX

## 2017-02-14 LAB — ABO/RH: ABO/RH(D): B POS

## 2017-02-14 LAB — CALCIUM / CREATININE RATIO, URINE
CREATININE, UR: 59.4 mg/dL
Calcium, Ur: 6.1 mg/dL
Calcium/Creat.Ratio: 103 mg/g creat (ref 0–260)

## 2017-02-14 LAB — CBC
HEMATOCRIT: 27.4 % — AB (ref 36.0–46.0)
HEMOGLOBIN: 7.7 g/dL — AB (ref 12.0–15.0)
MCH: 20.9 pg — ABNORMAL LOW (ref 26.0–34.0)
MCHC: 28.1 g/dL — AB (ref 30.0–36.0)
MCV: 74.3 fL — ABNORMAL LOW (ref 78.0–100.0)
Platelets: 112 10*3/uL — ABNORMAL LOW (ref 150–400)
RBC: 3.69 MIL/uL — AB (ref 3.87–5.11)
RDW: 22.3 % — ABNORMAL HIGH (ref 11.5–15.5)
WBC: 9.3 10*3/uL (ref 4.0–10.5)

## 2017-02-14 LAB — HAPTOGLOBIN

## 2017-02-14 LAB — HEPATITIS PANEL, ACUTE
HCV Ab: <0.1 s/co ratio (ref 0.0–0.9)
HEP A IGM: NEGATIVE
HEP B C IGM: NEGATIVE
Hepatitis B Surface Ag: NEGATIVE

## 2017-02-14 LAB — ADAMTS13 ACTIVITY: ADAMTS 13 ACTIVITY: 79.2 % (ref >66.8)

## 2017-02-14 LAB — PREPARE RBC (CROSSMATCH)

## 2017-02-14 MED ORDER — SODIUM CHLORIDE 0.9 % IV SOLN: Freq: Once | INTRAVENOUS | Status: DC

## 2017-02-14 NOTE — Progress Notes (Signed)
Madeline Brown   DOB:05-24-1976   YS#:[id number]   XNA#:[id number]  Hematology follow up note  Subjective: Patient did well last night, abdominal pain has improved. No bleeding. Blood pressure is normal now. Plt improved to 112K today, no neurological symptoms.   Objective:  Vitals:   02/14/17 1333 02/14/17 1400  BP: 116/82 106/73  Pulse:    Resp: (!) 25 20  Temp:    SpO2: 96% 92%    Body mass index is 32.3 kg/m.  Intake/Output Summary (Last 24 hours) at 02/14/17 1441 Last data filed at 02/14/17 0820  Gross per 24 hour  Intake              240 ml  Output                0 ml  Net              240 ml     Sclerae unicteric  Oropharynx clear  No peripheral adenopathy  Lungs clear -- no rales or rhonchi  Heart regular rate and rhythm  Abdomen soft, mild tenderness in the epigastric area  MSK no focal spinal tenderness, no peripheral edema  Neuro nonfocal   CBG (last 3)  No results for input(s): GLUCAP in the last 72 hours.   Labs:  Lab Results  Component Value Date   WBC 9.3 02/14/2017   HGB 7.7 (L) 02/14/2017   HCT 27.4 (L) 02/14/2017   MCV 74.3 (L) 02/14/2017   PLT 112 (L) 02/14/2017   NEUTROABS 5.4 06/17/2016   CMP Latest Ref Rng & Units 02/14/2017 02/13/2017 02/12/2017  Glucose 65 - 99 mg/dL 133(H) 107(H) 118(H)  BUN 6 - 20 mg/dL 38(H) 32(H) 32(H)  Creatinine 0.44 - 1.00 mg/dL 3.13(H) 2.63(H) 2.70(H)  Sodium 135 - 145 mmol/L 132(L) 134(L) 134(L)  Potassium 3.5 - 5.1 mmol/L 3.5 2.9(L) 2.3(LL)  Chloride 101 - 111 mmol/L 102 101 99(L)  CO2 22 - 32 mmol/L 20(L) 22 23  Calcium 8.9 - 10.3 mg/dL 8.7(L) 8.5(L) 9.2  Total Protein 6.5 - 8.1 g/dL - 6.7 7.3  Total Bilirubin 0.3 - 1.2 mg/dL - 1.1 1.3(H)  Alkaline Phos 38 - 126 U/L - 48 53  AST 15 - 41 U/L - 30 38  ALT 14 - 54 U/L - 14 16    Urine Studies No results for input(s): UHGB, CRYS in the last 72 hours.  Invalid input(s): UACOL, UAPR, USPG, UPH, UTP, UGL, UKET, UBIL, UNIT, UROB, ULEU, UEPI, UWBC, [place], [place],  [place], [place], [place]  Basic Metabolic Panel:  Recent Labs Lab 02/12/17 1856 02/13/17 0827 02/14/17 0604  NA 134* 134* 132*  K 2.3* 2.9* 3.5  CL 99* 101 102  CO2 23 22 20*  GLUCOSE 118* 107* 133*  BUN 32* 32* 38*  CREATININE 2.70* 2.63* 3.13*  CALCIUM 9.2 8.5* 8.7*  MG  --  1.6*  --   PHOS  --   --  3.1   GFR Estimated Creatinine Clearance: 21.6 mL/min (A) (by C-G formula based on SCr of 3.13 mg/dL (H)). Liver Function Tests:  Recent Labs Lab 02/12/17 2032 02/13/17 0820 02/14/17 0604  AST 38 30  --   ALT 16 14  --   ALKPHOS 53 48  --   BILITOT 1.3* 1.1  --   PROT 7.3 6.7  --   ALBUMIN 3.6 3.2* 2.9*    Recent Labs Lab 02/12/17 2032  LIPASE 39   No results for input(s): AMMONIA in the last 168 hours.  Coagulation profile  Recent Labs Lab 02/13/17 2119  INR 1.07    CBC:  Recent Labs Lab 02/12/17 1856 02/13/17 0827 02/13/17 2119 02/14/17 0604  WBC 8.8 8.5  --  9.3  HGB 9.7* 8.1*  --  7.7*  HCT 32.7* 27.6*  --  27.4*  MCV 72.5* 73.2*  --  74.3*  PLT 68* 63* 75* 112*   Cardiac Enzymes:  Recent Labs Lab 02/13/17 0827  CKTOTAL 37*  CKMB 4.5  TROPONINI 0.39*   BNP: Invalid input(s): POCBNP CBG: No results for input(s): GLUCAP in the last 168 hours. D-Dimer  Recent Labs  02/13/17 2119  DDIMER 4.40*   Hgb A1c  Recent Labs  02/13/17 0827  HGBA1C 4.6*   Lipid Profile No results for input(s): CHOL, HDL, LDLCALC, TRIG, CHOLHDL, LDLDIRECT in the last 72 hours. Thyroid function studies  Recent Labs  02/13/17 0827  TSH 1.062   Anemia work up  Recent Labs  02/13/17 0525 02/13/17 2038  VITAMINB12 2,634*  --   FERRITIN 14  --   TIBC 346  --   IRON 33  --   RETICCTPCT  --  5.4*   Microbiology No results found for this or any previous visit (from the past 240 hour(s)).    Studies:  Ct Abdomen Pelvis Wo Contrast  Result Date: 02/12/2017 CLINICAL DATA:  Abdominal pain, tender to palpation. Chest pressure for 4 days. Evaluate  potential pleural effusion or pleurisy. History of hypertension. EXAM: CT CHEST, ABDOMEN AND PELVIS WITHOUT CONTRAST TECHNIQUE: Multidetector CT imaging of the chest, abdomen and pelvis was performed following the standard protocol without IV contrast. COMPARISON:  None. FINDINGS: CT CHEST FINDINGS CARDIOVASCULAR: The heart is mildly enlarged. Small pericardial effusion. MEDIASTINUM/NODES: No mediastinal mass. No lymphadenopathy by CT size criteria limited assessment without contrast. Subcentimeter anterior mediastinal lymph node. Thoracic aorta is normal course and caliber. Normal appearance of thoracic esophagus though not tailored for evaluation. LUNGS/PLEURA: Tracheobronchial tree is patent, no pneumothorax. No pleural effusions, focal consolidations, pulmonary nodules or masses. Lingular atelectasis. Minimal dependent atelectasis. MUSCULOSKELETAL: Included soft tissues and included osseous structures appear normal. CT ABDOMEN PELVIS FINDINGS HEPATOBILIARY: Dense gallbladder sludge with mild suspected wall thickening. No biliary dilatation. Minimal focal fatty infiltration about the falciform ligament, liver is otherwise unremarkable. PANCREAS: Normal. SPLEEN: Normal. ADRENALS/URINARY TRACT: Kidneys are orthotopic, demonstrating normal size and morphology. 4 mm RIGHT lower pole nephrolithiasis. Hydronephrosis; limited assessment for renal masses on this nonenhanced examination. The unopacified ureters are normal in course and caliber. Urinary bladder is partially distended and unremarkable. Normal adrenal glands. STOMACH/BOWEL: The stomach, small and large bowel are normal in course and caliber without inflammatory changes, sensitivity decreased by lack of enteric contrast. Mild amount of retained large bowel stool. Mild colonic diverticulosis. Normal appendix. VASCULAR/LYMPHATIC: Aortoiliac vessels are normal in course and caliber. Mild calcific atherosclerosis. No lymphadenopathy by CT size criteria.  REPRODUCTIVE: Enlarged homogeneous uterus with lobulated contour. OTHER: No intraperitoneal free fluid or free air. MUSCULOSKELETAL: Non-acute. Small fat containing inguinal hernias. Small fat containing umbilical hernia. Anterior pelvic wall scarring. Mild S-type scoliosis. IMPRESSION: CT CHEST: 1.  Mild cardiomegaly and small pericardial effusion. 2. No acute pulmonary process. CT ABDOMEN AND PELVIS: 1. Gallbladder sludge and possible cholecystitis though, ultrasound would be more sensitive. 2. Nonobstructing 4 mm RIGHT nephrolithiasis. 3. 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 Electronically Signed   By: Elon Alas M.D.   On: 02/12/2017 22:08   Dg Chest 2 View  Result Date: 02/12/2017 CLINICAL DATA:  Five  day history of chest pressure. Current smoker. Current history of hypertension. EXAM: CHEST  2 VIEW COMPARISON:  None. FINDINGS: Cardiac silhouette moderately to markedly enlarged. Thoracic aorta mildly tortuous. Hilar and mediastinal contours otherwise unremarkable. Lungs clear. Bronchovascular markings normal. Pulmonary vascularity normal. No visible pleural effusions. No pneumothorax. Mild thoracic dextroscoliosis and thoracolumbar levoscoliosis. IMPRESSION: 1.  No acute cardiopulmonary disease. 2. Moderate to marked cardiomegaly. Electronically Signed   By: Evangeline Dakin M.D.   On: 02/12/2017 19:10   Ct Chest Wo Contrast  Result Date: 02/12/2017 CLINICAL DATA:  Abdominal pain, tender to palpation. Chest pressure for 4 days. Evaluate potential pleural effusion or pleurisy. History of hypertension. EXAM: CT CHEST, ABDOMEN AND PELVIS WITHOUT CONTRAST TECHNIQUE: Multidetector CT imaging of the chest, abdomen and pelvis was performed following the standard protocol without IV contrast. COMPARISON:  None. FINDINGS: CT CHEST FINDINGS CARDIOVASCULAR: The heart is mildly enlarged. Small pericardial effusion. MEDIASTINUM/NODES: No mediastinal mass. No lymphadenopathy by CT size criteria  limited assessment without contrast. Subcentimeter anterior mediastinal lymph node. Thoracic aorta is normal course and caliber. Normal appearance of thoracic esophagus though not tailored for evaluation. LUNGS/PLEURA: Tracheobronchial tree is patent, no pneumothorax. No pleural effusions, focal consolidations, pulmonary nodules or masses. Lingular atelectasis. Minimal dependent atelectasis. MUSCULOSKELETAL: Included soft tissues and included osseous structures appear normal. CT ABDOMEN PELVIS FINDINGS HEPATOBILIARY: Dense gallbladder sludge with mild suspected wall thickening. No biliary dilatation. Minimal focal fatty infiltration about the falciform ligament, liver is otherwise unremarkable. PANCREAS: Normal. SPLEEN: Normal. ADRENALS/URINARY TRACT: Kidneys are orthotopic, demonstrating normal size and morphology. 4 mm RIGHT lower pole nephrolithiasis. Hydronephrosis; limited assessment for renal masses on this nonenhanced examination. The unopacified ureters are normal in course and caliber. Urinary bladder is partially distended and unremarkable. Normal adrenal glands. STOMACH/BOWEL: The stomach, small and large bowel are normal in course and caliber without inflammatory changes, sensitivity decreased by lack of enteric contrast. Mild amount of retained large bowel stool. Mild colonic diverticulosis. Normal appendix. VASCULAR/LYMPHATIC: Aortoiliac vessels are normal in course and caliber. Mild calcific atherosclerosis. No lymphadenopathy by CT size criteria. REPRODUCTIVE: Enlarged homogeneous uterus with lobulated contour. OTHER: No intraperitoneal free fluid or free air. MUSCULOSKELETAL: Non-acute. Small fat containing inguinal hernias. Small fat containing umbilical hernia. Anterior pelvic wall scarring. Mild S-type scoliosis. IMPRESSION: CT CHEST: 1.  Mild cardiomegaly and small pericardial effusion. 2. No acute pulmonary process. CT ABDOMEN AND PELVIS: 1. Gallbladder sludge and possible cholecystitis though,  ultrasound would be more sensitive. 2. Nonobstructing 4 mm RIGHT nephrolithiasis. 3. 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 Electronically Signed   By: Elon Alas M.D.   On: 02/12/2017 22:08   US Renal  Result Date: 02/13/2017 CLINICAL DATA:  Acute kidney injury EXAM: RENAL / URINARY TRACT ULTRASOUND COMPLETE COMPARISON:  None. FINDINGS: Right Kidney: Length: 10.5 cm. Increased renal cortical echogenicity. 10 mm nonobstructing right renal calculus. No mass or hydronephrosis visualized. Left Kidney: Length: 9.6 cm. Increased renal cortical echogenicity. No mass or hydronephrosis visualized. Bladder: Appears normal for degree of bladder distention. IMPRESSION: 1. No obstructive uropathy. 2. Increased bilateral renal cortical echogenicity as can be seen with medical renal disease or acute kidney injury. Electronically Signed   By: Kathreen Devoid   On: 02/13/2017 09:04   Nm Hepato W/eject Fract  Result Date: 02/13/2017 CLINICAL DATA:  Mid abdominal pain with nausea EXAM: NUCLEAR MEDICINE HEPATOBILIARY IMAGING WITH GALLBLADDER EF TECHNIQUE: Sequential images of the abdomen were obtained out to 60 minutes following intravenous administration of radiopharmaceutical. After oral ingestion of Ensure,  gallbladder ejection fraction was determined. At 60 min, normal ejection fraction is greater than 33%. RADIOPHARMACEUTICALS:  5.3 mCi Tc-16m Choletec IV COMPARISON:  Ultrasound 02/13/2017, CT 02/12/2017 FINDINGS: Prompt uptake and biliary excretion of activity by the liver is seen. Gallbladder activity is visualized, consistent with patency of cystic duct. Biliary activity passes into small bowel, consistent with patent common bile duct. Calculated gallbladder ejection fraction is 22%. (Normal gallbladder ejection fraction with Ensure is greater than 33%.) IMPRESSION: 1. Negative for acute cholecystitis. Patent cystic duct. Normal excretion/ transit of radiotracer into the bowel. 2. Decreased  gallbladder ejection fraction of 22% suggesting dyskinesia Electronically Signed   By: KDonavan FoilM.D.   On: 02/13/2017 20:32   Dg Chest Port 1 View  Result Date: 02/14/2017 CLINICAL DATA:  Acute onset shortness of breath that began earlier this morning. EXAM: PORTABLE CHEST 1 VIEW COMPARISON:  Chest x-ray and CT chest 02/12/2017. FINDINGS: Cardiac silhouette moderately to markedly enlarged, unchanged. Interval development of mild diffuse interstitial pulmonary edema since the examinations 2 days ago. No confluent airspace consolidation. No visible pleural effusions. IMPRESSION: Mild CHF and/or fluid overload, with development of mild interstitial pulmonary edema since the examinations 2 days ago. Electronically Signed   By: TEvangeline DakinM.D.   On: 02/14/2017 11:19   UKoreaAbdomen Limited Ruq  Result Date: 02/13/2017 CLINICAL DATA:  Epigastric pain tonight. EXAM: ULTRASOUND ABDOMEN LIMITED RIGHT UPPER QUADRANT COMPARISON:  CT 02/12/2017 FINDINGS: Gallbladder: Gallbladder sludge. Mild mural thickening. No pericholecystic fluid. No tenderness to probe pressure over the gallbladder. Common bile duct: Diameter: Normal, 2.7 mm. Liver: Generalized coarsening of hepatic echotexture without focal lesion. Portal vein is patent on color Doppler imaging with normal direction of blood flow towards the liver. IMPRESSION: 1. Gallbladder sludge. No visible calculi. Normal bile ducts. Negative sonographic Murphy sign. 2. Coarsened hepatic parenchymal echotexture could represent fatty infiltration or hepatocellular disease. Electronically Signed   By: DAndreas NewportM.D.   On: 02/13/2017 00:43    Assessment: 41y.o.  African-American female, with past medical history of hypertension, presented with epigastric pain, was found to have a significantly elevated blood pressure, positive troponin, acute renal hernia, worsening anemia and moderate some osteopenia.  1. Hemolytic anemia and thrombocytopenia 2. AKI  3.  Hypertension emergency, improving  4. Positive troponin, likely demanding ischemia from hypertension and anemia  5. 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 Epigastric pain, possible cholecystitis 7. Hypokalemia   Recommendations: -Patient has lab evidence of microangiopathic hemolytic anemia (elevated indirect bilirubin, LDH, numerous schistocytes on the initial smear, and low haptoglobin), Coombs test was negative, no evidence of autoimmune hemolysis. I do not see the benefit of steroids. -DIC panel showed elevated fibrinogen,, normal PT and APTT, elevated d-dimer which is nonspecific. No lab evidence of DIC. 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  -The possibility of TTP and HUS is very low at this point, given the spontaneous improving thrombocytopenia along with better BP control. I did sent ADAMTS13 activity yesterday, the result will probably be back tomorrow.  -No indication for plasma exchange for now. -I recommend 1 unit RBC blood transfusion today, giving hemoglobin 7.9 and positive troponin. -she has received iv feraheme this morning, which I agree, consider second dose next week  -will follow up. Please repeat CBC with ret daily.    FTruitt Merle MD 02/14/2017  2:41 PM

## 2017-02-14 NOTE — Progress Notes (Signed)
Progress Note  Patient Name: Madeline Brown Date of Encounter: 02/14/2017  Primary Cardiologist: Dr Johnsie Cancel (new)  Subjective   Denies CP,  + SOB (mild)  Inpatient Medications    Scheduled Meds: . carvedilol  25 mg Oral BID WC  . hydrALAZINE  100 mg Oral Q8H  . NIFEdipine  60 mg Oral QHS   Continuous Infusions: . sodium chloride    . cefTRIAXone (ROCEPHIN)  IV 1 g (02/13/17 1513)   PRN Meds: acetaminophen **OR** acetaminophen, hydrALAZINE   Vital Signs    Vitals:   02/14/17 0700 02/14/17 0800 02/14/17 0815 02/14/17 0900  BP: (!) 138/96 130/89 132/89   Pulse:   (!) 104   Resp: (!) 23 13    Temp:  99.1 F (37.3 C)    TempSrc:  Oral    SpO2: (!) 81% 93%  91%  Weight:      Height:        Intake/Output Summary (Last 24 hours) at 02/14/17 1239 Last data filed at 02/14/17 0820  Gross per 24 hour  Intake              240 ml  Output                0 ml  Net              240 ml   Filed Weights   02/12/17 1836 02/14/17 0624  Weight: 180 lb (81.6 kg) 165 lb 6.4 oz (75 kg)    Telemetry    Sinus rhythm (tachycardia improved) - Personally Reviewed  Physical Exam   GEN- The patient is ill appearing, alert and oriented x 3 today.   Sitting up in a chair Head- normocephalic, atraumatic Eyes-  Sclera clear, conjunctiva pink Ears- hearing intact Oropharynx- clear Neck- supple, Lungs- decreased BS at bases Heart- Regular rate and rhythm  GI- soft, NT, ND, + BS Extremities- + mild dependant edema MS- no significant deformity or atrophy Skin- no rash or lesion Psych- euthymic mood, full affect Neuro- strength and sensation are intact   Labs    Chemistry Recent Labs Lab 02/12/17 1856 02/12/17 2032 02/13/17 0820 02/13/17 0827 02/14/17 0604  NA 134*  --   --  134* 132*  K 2.3*  --   --  2.9* 3.5  CL 99*  --   --  101 102  CO2 23  --   --  22 20*  GLUCOSE 118*  --   --  107* 133*  BUN 32*  --   --  32* 38*  CREATININE 2.70*  --   --  2.63* 3.13*    CALCIUM 9.2  --   --  8.5* 8.7*  PROT  --  7.3 6.7  --   --   ALBUMIN  --  3.6 3.2*  --  2.9*  AST  --  38 30  --   --   ALT  --  16 14  --   --   ALKPHOS  --  53 48  --   --   BILITOT  --  1.3* 1.1  --   --   GFRNONAA 21*  --   --  22* 17*  GFRAA 24*  --   --  25* 20*  ANIONGAP 12  --   --  11 10     Hematology Recent Labs Lab 02/12/17 1856 02/13/17 0827 02/13/17 2038 02/13/17 2119 02/14/17 0604  WBC 8.8 8.5  --   --  9.3  RBC 4.51 3.77* 3.50*  --  3.69*  HGB 9.7* 8.1*  --   --  7.7*  HCT 32.7* 27.6*  --   --  27.4*  MCV 72.5* 73.2*  --   --  74.3*  MCH 21.5* 21.5*  --   --  20.9*  MCHC 29.7* 29.3*  --   --  28.1*  RDW 21.0* 21.5*  --   --  22.3*  PLT 68* 63*  --  75* 112*    Cardiac Enzymes Recent Labs Lab 02/13/17 0827  TROPONINI 0.39*    Recent Labs Lab 02/12/17 1910 02/12/17 2053  TROPIPOC 0.44* 0.38*       Patient Profile     41 y.o. female followed by cardiology for problems noted below:  Assessment & Plan    1. Elevated troponin Likely demand ischemia due to medical illness/ renal failure/ hypertensive emergency Echo is reviewed with the patient today and does not show wall motion changes. A conservative approach is advised in the setting of her anemia/ renal failure/ HTN Could consider elective outpatient Myoview if she remains stable  2. Sinus tachycardia Likely due to medical illness also Would not be very aggressive in medical management of heart rate Treatment of underlying causes (ex anemia) would be preferred  3. SOB  Not likely cardiac in nature Primary team to reassess post transfusion  4. Anemia Primary team to evaluate Transfusion would like provide symptomatic improvement  5. Acute renal failure Nephrology on board  6. Hypertensive emergency Appears to be improving with medical therapy  Very complicated patient with complex medical presentation.   Cardiology issues appear to be secondary to medical  illness.  Cardiology team to see as needed while here.  Consider outpatient myoview upon discharge Please call with questions.   Thompson Grayer MD, [facility] 02/14/2017 12:39 PM

## 2017-02-14 NOTE — Progress Notes (Signed)
Patient ID: Madeline Brown, female   DOB: 07/22/1975, 41 y.o.   MRN: [id number] [facility] Progress Note   Assessment/ Plan:   1. Hypertensive emergency: Her pressures improved on the current combination of carvedilol, hydralazine and nifedipine-now off nicardipine drip. 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 We educated her regarding strict compliance with her medications as well as the risks of untreated hypertension.  2. Acute kidney injury: More than likely from severe and uncontrolled hypertension and now worsening renal function that appears to be hemodynamically mediated with improving blood pressure control/renal perfusion. We'll continue to monitor closely with daily labs and assessment of clinical status to decide on further intervention. Discontinue IV fluids.  3. 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 No indications for plasmapheresis at this point but will await further input from hematology.  4. Hypokalemia:  suspected to be from uncontrolled hypertension/kaliuresis-awaiting plasma renin/aldosterone ratio . 5. Hyponatremia: Appears to have chronic hyponatremia dating back to January of this year when she had normal renal function. Urine osmolality pending . 6. Epigastric pain/abdominal pain: Further imaging reveals no evidence of cholecystitis and mild biliary dyskinesia. Symptomatically, patient doing better.  Subjective:   Reports improvement of her abdominal pain somewhat anxious about her renal function.   Objective:   BP 132/89   Pulse (!) 104   Temp 99.1 F (37.3 C) (Oral)   Resp 13   Ht 5' (1.524 m)   Wt 75 kg (165 lb 6.4 oz)   LMP 02/10/2017   SpO2 91%   Breastfeeding? Unknown   BMI 32.30 kg/m  No  intake or output data in the 24 hours ending 02/14/17 1118 Weight change: -6.623 kg (-14 lb 9.6 oz)  Physical Exam: BDZ:[id number] resting in bed  HDQ:QIWLN regular tachycardia, S1 and S2 with ejection systolic murmur  Resp:Clear to auscultation bilaterally, no rales  LGX:QJJH, obese, nontender Ext: trace ankle edema   Imaging: Ct Abdomen Pelvis Wo Contrast  Result Date: 02/12/2017 CLINICAL DATA:  Abdominal pain, tender to palpation. Chest pressure for 4 days. Evaluate potential pleural effusion or pleurisy. History of hypertension. EXAM: CT CHEST, ABDOMEN AND PELVIS WITHOUT CONTRAST TECHNIQUE: Multidetector CT imaging of the chest, abdomen and pelvis was performed following the standard protocol without IV contrast. COMPARISON:  None. FINDINGS: CT CHEST FINDINGS CARDIOVASCULAR: The heart is mildly enlarged. Small pericardial effusion. MEDIASTINUM/NODES: No mediastinal mass. No lymphadenopathy by CT size criteria limited assessment without contrast. Subcentimeter anterior mediastinal lymph node. Thoracic aorta is normal course and caliber. Normal appearance of thoracic esophagus though not tailored for evaluation. LUNGS/PLEURA: Tracheobronchial tree is patent, no pneumothorax. No pleural effusions, focal consolidations, pulmonary nodules or masses. Lingular atelectasis. Minimal dependent atelectasis. MUSCULOSKELETAL: Included soft tissues and included osseous structures appear normal. CT ABDOMEN PELVIS FINDINGS HEPATOBILIARY: Dense gallbladder sludge with mild suspected wall thickening. No biliary dilatation. Minimal focal fatty infiltration about the falciform ligament, liver is otherwise unremarkable. PANCREAS: Normal. SPLEEN: Normal. ADRENALS/URINARY TRACT: Kidneys are orthotopic, demonstrating normal size and morphology. 4 mm RIGHT lower pole nephrolithiasis. Hydronephrosis; limited assessment for renal masses on this nonenhanced examination. The unopacified ureters are normal in course and  caliber. Urinary bladder is partially distended and unremarkable. Normal adrenal glands. STOMACH/BOWEL: The stomach, small and large bowel are normal in course and caliber without inflammatory changes, sensitivity decreased by lack of enteric contrast. Mild amount of retained large  bowel stool. Mild colonic diverticulosis. Normal appendix. VASCULAR/LYMPHATIC: Aortoiliac vessels are normal in course and caliber. Mild calcific atherosclerosis. No lymphadenopathy by CT size criteria. REPRODUCTIVE: Enlarged homogeneous uterus with lobulated contour. OTHER: No intraperitoneal free fluid or free air. MUSCULOSKELETAL: Non-acute. Small fat containing inguinal hernias. Small fat containing umbilical hernia. Anterior pelvic wall scarring. Mild S-type scoliosis. IMPRESSION: CT CHEST: 1.  Mild cardiomegaly and small pericardial effusion. 2. No acute pulmonary process. CT ABDOMEN AND PELVIS: 1. Gallbladder sludge and possible cholecystitis though, ultrasound would be more sensitive. 2. Nonobstructing 4 mm RIGHT nephrolithiasis. 3. 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 Electronically Signed   By: Elon Alas M.D.   On: 02/12/2017 22:08   Dg Chest 2 View  Result Date: 02/12/2017 CLINICAL DATA:  Five day history of chest pressure. Current smoker. Current history of hypertension. EXAM: CHEST  2 VIEW COMPARISON:  None. FINDINGS: Cardiac silhouette moderately to markedly enlarged. Thoracic aorta mildly tortuous. Hilar and mediastinal contours otherwise unremarkable. Lungs clear. Bronchovascular markings normal. Pulmonary vascularity normal. No visible pleural effusions. No pneumothorax. Mild thoracic dextroscoliosis and thoracolumbar levoscoliosis. IMPRESSION: 1.  No acute cardiopulmonary disease. 2. Moderate to marked cardiomegaly. Electronically Signed   By: Evangeline Dakin M.D.   On: 02/12/2017 19:10   Ct Chest Wo Contrast  Result Date: 02/12/2017 CLINICAL DATA:  Abdominal pain, tender to palpation.  Chest pressure for 4 days. Evaluate potential pleural effusion or pleurisy. History of hypertension. EXAM: CT CHEST, ABDOMEN AND PELVIS WITHOUT CONTRAST TECHNIQUE: Multidetector CT imaging of the chest, abdomen and pelvis was performed following the standard protocol without IV contrast. COMPARISON:  None. FINDINGS: CT CHEST FINDINGS CARDIOVASCULAR: The heart is mildly enlarged. Small pericardial effusion. MEDIASTINUM/NODES: No mediastinal mass. No lymphadenopathy by CT size criteria limited assessment without contrast. Subcentimeter anterior mediastinal lymph node. Thoracic aorta is normal course and caliber. Normal appearance of thoracic esophagus though not tailored for evaluation. LUNGS/PLEURA: Tracheobronchial tree is patent, no pneumothorax. No pleural effusions, focal consolidations, pulmonary nodules or masses. Lingular atelectasis. Minimal dependent atelectasis. MUSCULOSKELETAL: Included soft tissues and included osseous structures appear normal. CT ABDOMEN PELVIS FINDINGS HEPATOBILIARY: Dense gallbladder sludge with mild suspected wall thickening. No biliary dilatation. Minimal focal fatty infiltration about the falciform ligament, liver is otherwise unremarkable. PANCREAS: Normal. SPLEEN: Normal. ADRENALS/URINARY TRACT: Kidneys are orthotopic, demonstrating normal size and morphology. 4 mm RIGHT lower pole nephrolithiasis. Hydronephrosis; limited assessment for renal masses on this nonenhanced examination. The unopacified ureters are normal in course and caliber. Urinary bladder is partially distended and unremarkable. Normal adrenal glands. STOMACH/BOWEL: The stomach, small and large bowel are normal in course and caliber without inflammatory changes, sensitivity decreased by lack of enteric contrast. Mild amount of retained large bowel stool. Mild colonic diverticulosis. Normal appendix. VASCULAR/LYMPHATIC: Aortoiliac vessels are normal in course and caliber. Mild calcific atherosclerosis. No  lymphadenopathy by CT size criteria. REPRODUCTIVE: Enlarged homogeneous uterus with lobulated contour. OTHER: No intraperitoneal free fluid or free air. MUSCULOSKELETAL: Non-acute. Small fat containing inguinal hernias. Small fat containing umbilical hernia. Anterior pelvic wall scarring. Mild S-type scoliosis. IMPRESSION: CT CHEST: 1.  Mild cardiomegaly and small pericardial effusion. 2. No acute pulmonary process. CT ABDOMEN AND PELVIS: 1. Gallbladder sludge and possible cholecystitis though, ultrasound would be more sensitive. 2. Nonobstructing 4 mm RIGHT nephrolithiasis. 3. 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 Electronically Signed   By: Elon Alas M.D.   On: 02/12/2017 22:08   US Renal  Result Date: 02/13/2017 CLINICAL DATA:  Acute kidney injury EXAM: RENAL / URINARY TRACT  ULTRASOUND COMPLETE COMPARISON:  None. FINDINGS: Right Kidney: Length: 10.5 cm. Increased renal cortical echogenicity. 10 mm nonobstructing right renal calculus. No mass or hydronephrosis visualized. Left Kidney: Length: 9.6 cm. Increased renal cortical echogenicity. No mass or hydronephrosis visualized. Bladder: Appears normal for degree of bladder distention. IMPRESSION: 1. No obstructive uropathy. 2. Increased bilateral renal cortical echogenicity as can be seen with medical renal disease or acute kidney injury. Electronically Signed   By: Kathreen Devoid   On: 02/13/2017 09:04   Nm Hepato W/eject Fract  Result Date: 02/13/2017 CLINICAL DATA:  Mid abdominal pain with nausea EXAM: NUCLEAR MEDICINE HEPATOBILIARY IMAGING WITH GALLBLADDER EF TECHNIQUE: Sequential images of the abdomen were obtained out to 60 minutes following intravenous administration of radiopharmaceutical. After oral ingestion of Ensure, gallbladder ejection fraction was determined. At 60 min, normal ejection fraction is greater than 33%. RADIOPHARMACEUTICALS:  5.3 mCi Tc-35m  Choletec IV COMPARISON:  Ultrasound 02/13/2017, CT 02/12/2017 FINDINGS:  Prompt uptake and biliary excretion of activity by the liver is seen. Gallbladder activity is visualized, consistent with patency of cystic duct. Biliary activity passes into small bowel, consistent with patent common bile duct. Calculated gallbladder ejection fraction is 22%. (Normal gallbladder ejection fraction with Ensure is greater than 33%.) IMPRESSION: 1. Negative for acute cholecystitis. Patent cystic duct. Normal excretion/ transit of radiotracer into the bowel. 2. Decreased gallbladder ejection fraction of 22% suggesting dyskinesia Electronically Signed   By: Donavan Foil M.D.   On: 02/13/2017 20:32   US Abdomen Limited Ruq  Result Date: 02/13/2017 CLINICAL DATA:  Epigastric pain tonight. EXAM: ULTRASOUND ABDOMEN LIMITED RIGHT UPPER QUADRANT COMPARISON:  CT 02/12/2017 FINDINGS: Gallbladder: Gallbladder sludge. Mild mural thickening. No pericholecystic fluid. No tenderness to probe pressure over the gallbladder. Common bile duct: Diameter: Normal, 2.7 mm. Liver: Generalized coarsening of hepatic echotexture without focal lesion. Portal vein is patent on color Doppler imaging with normal direction of blood flow towards the liver. IMPRESSION: 1. Gallbladder sludge. No visible calculi. Normal bile ducts. Negative sonographic Murphy sign. 2. Coarsened hepatic parenchymal echotexture could represent fatty infiltration or hepatocellular disease. Electronically Signed   By: Andreas Newport M.D.   On: 02/13/2017 00:43    Labs: BMET  Recent Labs Lab 02/12/17 1856 02/13/17 0827 02/14/17 0604  NA 134* 134* 132*  K 2.3* 2.9* 3.5  CL 99* 101 102  CO2 23 22 20*  GLUCOSE 118* 107* 133*  BUN 32* 32* 38*  CREATININE 2.70* 2.63* 3.13*  CALCIUM 9.2 8.5* 8.7*  PHOS  --   --  3.1   CBC  Recent Labs Lab 02/12/17 1856 02/13/17 0827 02/13/17 2119 02/14/17 0604  WBC 8.8 8.5  --  9.3  HGB 9.7* 8.1*  --  7.7*  HCT 32.7* 27.6*  --  27.4*  MCV 72.5* 73.2*  --  74.3*  PLT 68* 63* 75* 112*     Medications:    . carvedilol  25 mg Oral BID WC  . hydrALAZINE  100 mg Oral Q8H  . NIFEdipine  60 mg Oral QHS      Elmarie Shiley, MD 02/14/2017, 11:18 AM

## 2017-02-14 NOTE — Progress Notes (Signed)
PROGRESS NOTE    Madeline Brown  VPX:[id number] DOB: 06/09/1976 DOA: 02/12/2017 PCP: Patient, No Pcp Per   Brief Narrative:  [name] a 41 y.o.female,w hypertension apparently presents due to epigastric pain for the past 2 days with Slight nausea. N ER, pt found to be in ARF Hypokalemic, Anemic, and with a Trop 0.38, and Hypertensive Emergency. CT Abd/Pelvis showed Gallbladder sludge and possible cholecystitis, a Nonobstructing 4 mm RIGHT nephrolithiasis. 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 She was transferred to [name] for a Nephrology evaluation and Hematology evaluation and found to have a Hemolytic Anemia. Workup currently in progress and Cardiology, Nephrology, and Hematology consulted to evaluate the Patient. Patient's BP has improved but Kidney Function has worsened. Patient was transfused 1 unit of pRBC's today given her SOB and drop in Hb/Hct.   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 -Smear showed Schistocytes; T Bili was elevated -Haptoglobin was <10, LDH was 682, D-Dimer was 4.40, Fibrinogen was 498, PT was 13.8, INR was 1.07, APTT was 29 -Coombs Test was Negative -Anemia Panel showed Iron level of 33, UIBC 313, TIBC of 346, Saturation Ratios of 10, Ferritin of 14, Vitamin B12 of 2634 -Hb/Hct went from 8.1/27.6 -> 7.7/27.4 -Ordered 1 unit of pRBC's to be transfued -Nephrology ordering Feraheme -Hematology doesn't feel like this is Autoimmune hemolysis so they do not see any benefits of Steroids -DIC not evident given elevated Fibrinogen  -Continue to Monitor and Repeat CBC with Diff and Retic Count in AM  Thrombocytopenia -Platelet Count improved from 63  -> 75 -> 112 -Hematology feel TTP an HUS are low possibilities given improving Platelet Count -ADAMTS13 Activity was 79.2  -Continue to Monitor and Repeat CBC in AM -Appreciate Hematology following and appreciate Recc's  AKI/Acute Renal Failure -Worsening. BUN/Cr went from 32/2.63 -> 38/3.13 -Nephrology Following and appreciate additional Recc's -Nephrology feels like it was most likely hemodynamically mediated with with HTN Emergency and suspect BP control would be associated with slight rise in Cr prior to Eventual Improvement  -Renal U/S Negative for Obstruction and Urine Lytes sent by Nephrology -Nephrology Discontinued IVF -Continue to Monitor and Repeat CMP in AM; Nephrology to decide on further intervention  Hypertensive Emergency, Improved -Patient now off Nicardipine gtt -C/w Carvedilol 25 mg po BID, Hydralazine 100 mg po q8h -C/w IV Hydralazine 10 mg q6hprn for SBP >160  Elevated Troponin -Troponin elevated at 0.39 -Cardiology evaluated and feel likley ischemia due to medical illness/ renal failure/ hypertensive emergency -ECHO done and shows -Cardiology recommending conservative approach and considering Outpatient Myoview at D/C -Cardiology Signing off  Mild Hyponatremia -Sodium went from 134 -> 132 -Nephrology following and 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 -Serum Osmolality was 289 and Urine Osm Still pending -Repeat CMP in AM  Hypokalemia -K+ went from 2.9 -> 3.5 -Replete -Nephrology following and feels that is from Uncontrolled HTN/Kaliuresis -Plasma Renin/Aldosterone Ratio pending  -Repeat CMP in AM   Shortness of Breath -Patient saturating well on Room Air -Likely feeling SOB from low Hb -Transfuse 1 unit of pRBC's and re-evaluate   Abdominal Pain/Right Upper Quadrant Pain -CT Abd/Pelvis showed Gallbladder sludge and possible cholecystitis though, ultrasound would be more sensitive.  Nonobstructing 4 mm RIGHT nephrolithiasis. Enlarged lobulated and  likely  leiomyomatous uterus. -U/S of Gallbladder shows Gallbladder sludge. No visible calculi. Normal bile ducts. Negative sonographic Murphy sign. Coarsened hepatic parenchymal echotexture could represent fatty infiltration or hepatocellular disease. -Lipase was Normal -HIDA Scan done and showed Negative for acute cholecystitis. Patent cystic duct. Normal excretion/ transit of radiotracer into the bowel. Decreased gallbladder ejection fraction of 22% suggesting dyskinesia -Abdominal Pain Improved -Will discuss with General Surgery about HIDA findings   Fatty infiltration of liver/Hepatocellular diseases. -Mildly increase bili.  -Recommended, low fat diet, weight lost. Advised to stop drinking alcohol.  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 -Patient aware. Needs to follow with Gynecology as an outpatient   ? UTI -Urinalysis showed Trace Leukocytes, TNTC RBC, TNTC WBC, and No Bacteria -Was started on Empiric Ceftriaxone yesterday -Will continue for now until Urine Cx Results  DVT prophylaxis: SCDs Code Status: FULL CODE Family Communication: Husband at Bedside Disposition Plan: Remain Inpatient   Consultants:   Hematology  Nephrology  Cardiology   Procedures: HIDA SCAN   Antimicrobials:  Anti-infectives    Start     Dose/Rate Route Frequency Ordered Stop   02/13/17 1500  cefTRIAXone (ROCEPHIN) 1 g in dextrose 5 % 50 mL IVPB     1 g 100 mL/hr over 30 Minutes Intravenous Every 24 hours 02/13/17 1437       Subjective: Seen and examined at bedside and was doing better. No Abdominal Pain but complained of SOB though she was saturating 96% on Room Air. No Nausea or Vomiting. Had a lot of questions about her SOB. No other concerns or complaints.   Objective: Vitals:   02/13/17 2238 02/13/17 2300 02/14/17 0624 02/14/17 0815  BP: (!) 144/98 (!) 139/99  132/89  Pulse:    (!) 104  Resp: (!) 22 (!) 23    Temp: 97.6 F  (36.4 C)  98.5 F (36.9 C)   TempSrc: Oral  Oral   SpO2:   91%   Weight:   75 kg (165 lb 6.4 oz)   Height:       No intake or output data in the 24 hours ending 02/14/17 0823 Filed Weights   02/12/17 1836 02/14/17 0624  Weight: 81.6 kg (180 lb) 75 kg (165 lb 6.4 oz)   Examination: Physical Exam:  Constitutional: WN/WD obese AAF inNAD and appears calm and comfortable Eyes: Llids and conjunctivae slightly pale, sclerae anicteric  ENMT: External Ears, Nose appear normal. Grossly normal hearing. Mucous membranes are moist. Neck: Appears normal, supple, no cervical masses, normal ROM, no appreciable thyromegaly, no JVD Respiratory: Clear to auscultation bilaterally, no wheezing, rales, rhonchi or crackles. Normal respiratory effort and patient is not tachypenic. No accessory muscle use.  Cardiovascular: RRR, no murmurs / rubs / gallops. S1 and S2 auscultated. No extremity edema.  Abdomen: Soft, non-tender, non-distended. No masses palpated. No appreciable hepatosplenomegaly. Bowel sounds positive.  GU: Deferred. Musculoskeletal: No clubbing / cyanosis of digits/nails. No joint deformity upper and lower extremities.  Skin: No rashes, lesions, ulcers on a limited skin eval. No induration; Warm and dry.  Neurologic: CN 2-12 grossly intact with no focal deficits. Sensation intact in all 4 Extremities. Romberg sign cerebellar reflexes not assessed.  Psychiatric: Normal judgment and insight. Alert and oriented x 3. Anxious mood and appropriate affect.   Data Reviewed: I have personally reviewed following labs and imaging studies  CBC:  Recent Labs Lab 02/12/17 1856 02/13/17 0827 02/13/17 2119 02/14/17 0604  WBC 8.8 8.5  --  9.3  HGB 9.7* 8.1*  --  7.7*  HCT 32.7* 27.6*  --  27.4*  MCV 72.5* 73.2*  --  74.3*  PLT 68* 63* 75* PENDING   Basic Metabolic Panel:  Recent Labs Lab 02/12/17 1856 02/13/17 0827 02/14/17 0604  NA 134* 134* 132*  K 2.3* 2.9* 3.5  CL 99* 101 102  CO2 23  22 20*  GLUCOSE 118* 107* 133*  BUN 32* 32* 38*  CREATININE 2.70* 2.63* 3.13*  CALCIUM 9.2 8.5* 8.7*  MG  --  1.6*  --   PHOS  --   --  3.1   GFR: Estimated Creatinine Clearance: 21.6 mL/min (A) (by C-G formula based on SCr of 3.13 mg/dL (H)). Liver Function Tests:  Recent Labs Lab 02/12/17 2032 02/13/17 0820 02/14/17 0604  AST 38 30  --   ALT 16 14  --   ALKPHOS 53 48  --   BILITOT 1.3* 1.1  --   PROT 7.3 6.7  --   ALBUMIN 3.6 3.2* 2.9*    Recent Labs Lab 02/12/17 2032  LIPASE 39   No results for input(s): AMMONIA in the last 168 hours. Coagulation Profile:  Recent Labs Lab 02/13/17 2119  INR 1.07   Cardiac Enzymes:  Recent Labs Lab 02/13/17 0827  CKTOTAL 37*  CKMB 4.5  TROPONINI 0.39*   BNP (last 3 results) No results for input(s): PROBNP in the last 8760 hours. HbA1C:  Recent Labs  02/13/17 0827  HGBA1C 4.6*   CBG: No results for input(s): GLUCAP in the last 168 hours. Lipid Profile: No results for input(s): CHOL, HDL, LDLCALC, TRIG, CHOLHDL, LDLDIRECT in the last 72 hours. Thyroid Function Tests:  Recent Labs  02/13/17 0827  TSH 1.062   Anemia Panel:  Recent Labs  02/13/17 0525 02/13/17 2038  VITAMINB12 2,634*  --   FERRITIN 14  --   TIBC 346  --   IRON 33  --   RETICCTPCT  --  5.4*   Sepsis Labs:  Recent Labs Lab 02/12/17 2056  LATICACIDVEN 1.13    No results found for this or any previous visit (from the past 240 hour(s)).   Radiology Studies: Ct Abdomen Pelvis Wo Contrast  Result Date: 02/12/2017 CLINICAL DATA:  Abdominal pain, tender to palpation. Chest pressure for 4 days. Evaluate potential pleural effusion or pleurisy. History of hypertension. EXAM: CT CHEST, ABDOMEN AND PELVIS WITHOUT CONTRAST TECHNIQUE: Multidetector CT imaging of the chest, abdomen and pelvis was performed following the standard protocol without IV contrast. COMPARISON:  None. FINDINGS: CT CHEST FINDINGS CARDIOVASCULAR: The heart is mildly  enlarged. Small pericardial effusion. MEDIASTINUM/NODES: No mediastinal mass. No lymphadenopathy by CT size criteria limited assessment without contrast. Subcentimeter anterior mediastinal lymph node. Thoracic aorta is normal course and caliber. Normal appearance of thoracic esophagus though not tailored for evaluation. LUNGS/PLEURA: Tracheobronchial tree is patent, no pneumothorax. No pleural effusions, focal consolidations, pulmonary nodules or masses. Lingular atelectasis. Minimal dependent atelectasis. MUSCULOSKELETAL: Included soft tissues and included osseous structures appear normal. CT ABDOMEN PELVIS FINDINGS HEPATOBILIARY: Dense gallbladder sludge with mild suspected wall thickening. No biliary dilatation. Minimal focal fatty infiltration about the falciform ligament, liver is otherwise unremarkable. PANCREAS: Normal. SPLEEN: Normal. ADRENALS/URINARY TRACT: Kidneys are orthotopic, demonstrating normal size and morphology. 4 mm RIGHT lower pole nephrolithiasis. Hydronephrosis; limited assessment for renal masses on this nonenhanced examination. The unopacified ureters are normal in course and caliber. Urinary bladder is partially distended and unremarkable. Normal adrenal glands. STOMACH/BOWEL: The stomach, small and large bowel  are normal in course and caliber without inflammatory changes, sensitivity decreased by lack of enteric contrast. Mild amount of retained large bowel stool. Mild colonic diverticulosis. Normal appendix. VASCULAR/LYMPHATIC: Aortoiliac vessels are normal in course and caliber. Mild calcific atherosclerosis. No lymphadenopathy by CT size criteria. REPRODUCTIVE: Enlarged homogeneous uterus with lobulated contour. OTHER: No intraperitoneal free fluid or free air. MUSCULOSKELETAL: Non-acute. Small fat containing inguinal hernias. Small fat containing umbilical hernia. Anterior pelvic wall scarring. Mild S-type scoliosis. IMPRESSION: CT CHEST: 1.  Mild cardiomegaly and small pericardial  effusion. 2. No acute pulmonary process. CT ABDOMEN AND PELVIS: 1. Gallbladder sludge and possible cholecystitis though, ultrasound would be more sensitive. 2. Nonobstructing 4 mm RIGHT nephrolithiasis. 3. 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 Electronically Signed   By: Elon Alas M.D.   On: 02/12/2017 22:08   Dg Chest 2 View  Result Date: 02/12/2017 CLINICAL DATA:  Five day history of chest pressure. Current smoker. Current history of hypertension. EXAM: CHEST  2 VIEW COMPARISON:  None. FINDINGS: Cardiac silhouette moderately to markedly enlarged. Thoracic aorta mildly tortuous. Hilar and mediastinal contours otherwise unremarkable. Lungs clear. Bronchovascular markings normal. Pulmonary vascularity normal. No visible pleural effusions. No pneumothorax. Mild thoracic dextroscoliosis and thoracolumbar levoscoliosis. IMPRESSION: 1.  No acute cardiopulmonary disease. 2. Moderate to marked cardiomegaly. Electronically Signed   By: Evangeline Dakin M.D.   On: 02/12/2017 19:10   Ct Chest Wo Contrast  Result Date: 02/12/2017 CLINICAL DATA:  Abdominal pain, tender to palpation. Chest pressure for 4 days. Evaluate potential pleural effusion or pleurisy. History of hypertension. EXAM: CT CHEST, ABDOMEN AND PELVIS WITHOUT CONTRAST TECHNIQUE: Multidetector CT imaging of the chest, abdomen and pelvis was performed following the standard protocol without IV contrast. COMPARISON:  None. FINDINGS: CT CHEST FINDINGS CARDIOVASCULAR: The heart is mildly enlarged. Small pericardial effusion. MEDIASTINUM/NODES: No mediastinal mass. No lymphadenopathy by CT size criteria limited assessment without contrast. Subcentimeter anterior mediastinal lymph node. Thoracic aorta is normal course and caliber. Normal appearance of thoracic esophagus though not tailored for evaluation. LUNGS/PLEURA: Tracheobronchial tree is patent, no pneumothorax. No pleural effusions, focal consolidations, pulmonary nodules or masses.  Lingular atelectasis. Minimal dependent atelectasis. MUSCULOSKELETAL: Included soft tissues and included osseous structures appear normal. CT ABDOMEN PELVIS FINDINGS HEPATOBILIARY: Dense gallbladder sludge with mild suspected wall thickening. No biliary dilatation. Minimal focal fatty infiltration about the falciform ligament, liver is otherwise unremarkable. PANCREAS: Normal. SPLEEN: Normal. ADRENALS/URINARY TRACT: Kidneys are orthotopic, demonstrating normal size and morphology. 4 mm RIGHT lower pole nephrolithiasis. Hydronephrosis; limited assessment for renal masses on this nonenhanced examination. The unopacified ureters are normal in course and caliber. Urinary bladder is partially distended and unremarkable. Normal adrenal glands. STOMACH/BOWEL: The stomach, small and large bowel are normal in course and caliber without inflammatory changes, sensitivity decreased by lack of enteric contrast. Mild amount of retained large bowel stool. Mild colonic diverticulosis. Normal appendix. VASCULAR/LYMPHATIC: Aortoiliac vessels are normal in course and caliber. Mild calcific atherosclerosis. No lymphadenopathy by CT size criteria. REPRODUCTIVE: Enlarged homogeneous uterus with lobulated contour. OTHER: No intraperitoneal free fluid or free air. MUSCULOSKELETAL: Non-acute. Small fat containing inguinal hernias. Small fat containing umbilical hernia. Anterior pelvic wall scarring. Mild S-type scoliosis. IMPRESSION: CT CHEST: 1.  Mild cardiomegaly and small pericardial effusion. 2. No acute pulmonary process. CT ABDOMEN AND PELVIS: 1. Gallbladder sludge and possible cholecystitis though, ultrasound would be more sensitive. 2. Nonobstructing 4 mm RIGHT nephrolithiasis. 3. 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 Electronically Signed   By: Elon Alas M.D.   On:  02/12/2017 22:08   US Renal  Result Date: 02/13/2017 CLINICAL DATA:  Acute kidney injury EXAM: RENAL / URINARY TRACT ULTRASOUND COMPLETE  COMPARISON:  None. FINDINGS: Right Kidney: Length: 10.5 cm. Increased renal cortical echogenicity. 10 mm nonobstructing right renal calculus. No mass or hydronephrosis visualized. Left Kidney: Length: 9.6 cm. Increased renal cortical echogenicity. No mass or hydronephrosis visualized. Bladder: Appears normal for degree of bladder distention. IMPRESSION: 1. No obstructive uropathy. 2. Increased bilateral renal cortical echogenicity as can be seen with medical renal disease or acute kidney injury. Electronically Signed   By: Kathreen Devoid   On: 02/13/2017 09:04   Nm Hepato W/eject Fract  Result Date: 02/13/2017 CLINICAL DATA:  Mid abdominal pain with nausea EXAM: NUCLEAR MEDICINE HEPATOBILIARY IMAGING WITH GALLBLADDER EF TECHNIQUE: Sequential images of the abdomen were obtained out to 60 minutes following intravenous administration of radiopharmaceutical. After oral ingestion of Ensure, gallbladder ejection fraction was determined. At 60 min, normal ejection fraction is greater than 33%. RADIOPHARMACEUTICALS:  5.3 mCi Tc-33m  Choletec IV COMPARISON:  Ultrasound 02/13/2017, CT 02/12/2017 FINDINGS: Prompt uptake and biliary excretion of activity by the liver is seen. Gallbladder activity is visualized, consistent with patency of cystic duct. Biliary activity passes into small bowel, consistent with patent common bile duct. Calculated gallbladder ejection fraction is 22%. (Normal gallbladder ejection fraction with Ensure is greater than 33%.) IMPRESSION: 1. Negative for acute cholecystitis. Patent cystic duct. Normal excretion/ transit of radiotracer into the bowel. 2. Decreased gallbladder ejection fraction of 22% suggesting dyskinesia Electronically Signed   By: Donavan Foil M.D.   On: 02/13/2017 20:32   US Abdomen Limited Ruq  Result Date: 02/13/2017 CLINICAL DATA:  Epigastric pain tonight. EXAM: ULTRASOUND ABDOMEN LIMITED RIGHT UPPER QUADRANT COMPARISON:  CT 02/12/2017 FINDINGS: Gallbladder: Gallbladder  sludge. Mild mural thickening. No pericholecystic fluid. No tenderness to probe pressure over the gallbladder. Common bile duct: Diameter: Normal, 2.7 mm. Liver: Generalized coarsening of hepatic echotexture without focal lesion. Portal vein is patent on color Doppler imaging with normal direction of blood flow towards the liver. IMPRESSION: 1. Gallbladder sludge. No visible calculi. Normal bile ducts. Negative sonographic Murphy sign. 2. Coarsened hepatic parenchymal echotexture could represent fatty infiltration or hepatocellular disease. Electronically Signed   By: Andreas Newport M.D.   On: 02/13/2017 00:43   Scheduled Meds: . carvedilol  25 mg Oral BID WC  . hydrALAZINE  100 mg Oral Q8H  . NIFEdipine  60 mg Oral QHS   Continuous Infusions: . sodium chloride 75 mL/hr at 02/13/17 0835  . cefTRIAXone (ROCEPHIN)  IV 1 g (02/13/17 1513)  . ferumoxytol      LOS: 2 days   Kerney Elbe, DO [facility] Pager [phone]  If 7PM-7AM, please contact night-coverage [email or web address] Password [facility] 02/14/2017, 8:23 AM

## 2017-02-15 DIAGNOSIS — E8770 Fluid overload, unspecified: Secondary | ICD-10-CM

## 2017-02-15 LAB — COMPREHENSIVE METABOLIC PANEL
ALBUMIN: 3 g/dL — AB (ref 3.5–5.0)
ALK PHOS: 46 U/L (ref 38–126)
ALT: 18 U/L (ref 14–54)
ANION GAP: 10 (ref 5–15)
AST: 23 U/L (ref 15–41)
BUN: 39 mg/dL — ABNORMAL HIGH (ref 6–20)
CO2: 19 mmol/L — AB (ref 22–32)
Calcium: 8.6 mg/dL — ABNORMAL LOW (ref 8.9–10.3)
Chloride: 102 mmol/L (ref 101–111)
Creatinine, Ser: 3.64 mg/dL — ABNORMAL HIGH (ref 0.44–1.00)
GFR calc Af Amer: 17 mL/min — ABNORMAL LOW (ref >60)
GFR calc non Af Amer: 15 mL/min — ABNORMAL LOW (ref >60)
GLUCOSE: 142 mg/dL — AB (ref 65–99)
POTASSIUM: 2.9 mmol/L — AB (ref 3.5–5.1)
SODIUM: 131 mmol/L — AB (ref 135–145)
Total Bilirubin: 0.8 mg/dL (ref 0.3–1.2)
Total Protein: 6.1 g/dL — ABNORMAL LOW (ref 6.5–8.1)

## 2017-02-15 LAB — CBC WITH DIFFERENTIAL/PLATELET
BASOS ABS: 0 10*3/uL (ref 0.0–0.1)
Basophils Relative: 0 %
EOS ABS: 0.1 10*3/uL (ref 0.0–0.7)
Eosinophils Relative: 1 %
HCT: 28.4 % — ABNORMAL LOW (ref 36.0–46.0)
HEMOGLOBIN: 8.4 g/dL — AB (ref 12.0–15.0)
LYMPHS PCT: 14 %
Lymphs Abs: 1.1 10*3/uL (ref 0.7–4.0)
MCH: 22.2 pg — ABNORMAL LOW (ref 26.0–34.0)
MCHC: 29.6 g/dL — AB (ref 30.0–36.0)
MCV: 74.9 fL — ABNORMAL LOW (ref 78.0–100.0)
Monocytes Absolute: 0.6 10*3/uL (ref 0.1–1.0)
Monocytes Relative: 7 %
NEUTROS ABS: 6.1 10*3/uL (ref 1.7–7.7)
NEUTROS PCT: 78 %
Platelets: 102 10*3/uL — ABNORMAL LOW (ref 150–400)
RBC: 3.79 MIL/uL — AB (ref 3.87–5.11)
RDW: 21.3 % — ABNORMAL HIGH (ref 11.5–15.5)
WBC: 7.9 10*3/uL (ref 4.0–10.5)

## 2017-02-15 LAB — TYPE AND SCREEN
ABO/RH(D): B POS
Antibody Screen: NEGATIVE
UNIT DIVISION: 0

## 2017-02-15 LAB — RETICULOCYTES
RBC.: 3.79 MIL/uL — AB (ref 3.87–5.11)
RETIC CT PCT: 5.1 % — AB (ref 0.4–3.1)
Retic Count, Absolute: 193.3 10*3/uL — ABNORMAL HIGH (ref 19.0–186.0)

## 2017-02-15 LAB — BPAM RBC
Blood Product Expiration Date: 201809182359
ISSUE DATE / TIME: 201809011437
Unit Type and Rh: 7300

## 2017-02-15 LAB — HIV ANTIBODY (ROUTINE TESTING W REFLEX): HIV SCREEN 4TH GENERATION: NONREACTIVE

## 2017-02-15 LAB — OSMOLALITY, URINE: Osmolality, Ur: 415 mOsm/kg (ref 300–900)

## 2017-02-15 LAB — CREATININE, URINE, RANDOM: Creatinine, Urine: 134.53 mg/dL

## 2017-02-15 LAB — NA AND K (SODIUM & POTASSIUM), RAND UR
Potassium Urine: 27 mmol/L
SODIUM UR: 14 mmol/L

## 2017-02-15 LAB — PHOSPHORUS: Phosphorus: 3.2 mg/dL (ref 2.5–4.6)

## 2017-02-15 LAB — MAGNESIUM: Magnesium: 2.4 mg/dL (ref 1.7–2.4)

## 2017-02-15 MED ORDER — FUROSEMIDE 10 MG/ML IJ SOLN
20.0000 mg | Freq: Once | INTRAMUSCULAR | Status: AC
  Administered 2017-02-15: 20 mg via INTRAVENOUS
  Filled 2017-02-15: qty 2

## 2017-02-15 MED ORDER — POTASSIUM CHLORIDE CRYS ER 20 MEQ PO TBCR
40.0000 meq | EXTENDED_RELEASE_TABLET | Freq: Two times a day (BID) | ORAL | Status: DC
  Administered 2017-02-15 – 2017-02-19 (×9): 40 meq via ORAL
  Filled 2017-02-15 (×9): qty 2

## 2017-02-15 MED ORDER — ALBUTEROL SULFATE (2.5 MG/3ML) 0.083% IN NEBU: 2.5000 mg | INHALATION_SOLUTION | Freq: Four times a day (QID) | RESPIRATORY_TRACT | Status: DC | PRN

## 2017-02-15 NOTE — Progress Notes (Signed)
Patient ID: Madeline Brown, female   DOB: 05/28/1976, 41 y.o.   MRN: [id number] [facility] Progress Note   Assessment/ Plan:   1. Hypertensive emergency: Blood pressures showing improvement on carvedilol, hydralazine and nifedipine. 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 We discussed the need for adherence anti-hypertensive therapy/follow-up.  2. Acute kidney injury: Worsening renal function noted overnight likely hemodynamically mediated with improving blood pressure control-continue monitoring on current therapy with anticipated improvement expected over the next few days. Intravenous fluids discontinued yesterday and this morning given 20 mg intravenous furosemide for orthopnea/lung exam findings. 3. Anemia/thrombocytopenia-Likely thrombotic microangiopathy: Based on the available lab data and workup by hematology, appears to be TMA associated with hypertensive emergency (schistocytes, elevated LDH, low haptoglobin).  4. Hypokalemia:  suspected to be from uncontrolled hypertension/kaliuresis-awaiting plasma renin/aldosterone ratio. Continue oral supplementation of potassium  5. Hyponatremia: Appears to have chronic hyponatremia dating back to January of this year when she had normal renal function. Urine osmolality pending-status post furosemide today and will decide on need for scheduled oral furosemide . 6. Epigastric pain/abdominal pain: Further imaging reveals no evidence of cholecystitis and mild biliary dyskinesia. Symptomatically, patient doing better.  Subjective:   Reports shortness of breath when she lays flat and worried about her renal function .   Objective:   BP (!) 149/109 (BP Location: Right Arm)   Pulse (!) 106   Temp 98 F (36.7 C) (Oral)   Resp (!) 21   Ht 5' (1.524 m)   Wt 76.7 kg (169 lb)   LMP 02/10/2017   SpO2 97%   Breastfeeding? Unknown   BMI 33.01 kg/m   Intake/Output  Summary (Last 24 hours) at 02/15/17 1120 Last data filed at 02/15/17 0851  Gross per 24 hour  Intake              825 ml  Output                1 ml  Net              824 ml   Weight change: 1.633 kg (3 lb 9.6 oz)  Physical Exam: ZJI:[id number] sitting up in bed  PZW:CHENI regular tachycardia, S1 and S2 with ejection systolic murmur  Resp: Fine rales left base otherwise clear to auscultation, no distinct rhonchi or wheeze  DPO:EUMP, obese, nontender Ext: trace ankle edema   Imaging: Nm Hepato W/eject Fract  Result Date: 02/13/2017 CLINICAL DATA:  Mid abdominal pain with nausea EXAM: NUCLEAR MEDICINE HEPATOBILIARY IMAGING WITH GALLBLADDER EF TECHNIQUE: Sequential images of the abdomen were obtained out to 60 minutes following intravenous administration of radiopharmaceutical. After oral ingestion of Ensure, gallbladder ejection fraction was determined. At 60 min, normal ejection fraction is greater than 33%. RADIOPHARMACEUTICALS:  5.3 mCi Tc-81m  Choletec IV COMPARISON:  Ultrasound 02/13/2017, CT 02/12/2017 FINDINGS: Prompt uptake and biliary excretion of activity by the liver is seen. Gallbladder activity is visualized, consistent with patency of cystic duct. Biliary activity passes into small bowel, consistent with patent common bile duct. Calculated gallbladder ejection fraction is 22%. (Normal gallbladder ejection fraction with Ensure is greater than 33%.) IMPRESSION: 1. Negative for acute cholecystitis. Patent cystic duct. Normal excretion/ transit of radiotracer into the bowel. 2. Decreased gallbladder ejection fraction of 22% suggesting dyskinesia Electronically Signed   By: Donavan Foil M.D.   On: 02/13/2017 20:32   Dg Chest Port 1 View  Result Date: 02/14/2017 CLINICAL DATA:  Acute onset  shortness of breath that began earlier this morning. EXAM: PORTABLE CHEST 1 VIEW COMPARISON:  Chest x-ray and CT chest 02/12/2017. FINDINGS: Cardiac silhouette moderately to markedly enlarged,  unchanged. Interval development of mild diffuse interstitial pulmonary edema since the examinations 2 days ago. No confluent airspace consolidation. No visible pleural effusions. IMPRESSION: Mild CHF and/or fluid overload, with development of mild interstitial pulmonary edema since the examinations 2 days ago. Electronically Signed   By: Evangeline Dakin M.D.   On: 02/14/2017 11:19    Labs: BMET  Recent Labs Lab 02/12/17 1856 02/13/17 0827 02/14/17 0604 02/15/17 0259  NA 134* 134* 132* 131*  K 2.3* 2.9* 3.5 2.9*  CL 99* 101 102 102  CO2 23 22 20* 19*  GLUCOSE 118* 107* 133* 142*  BUN 32* 32* 38* 39*  CREATININE 2.70* 2.63* 3.13* 3.64*  CALCIUM 9.2 8.5* 8.7* 8.6*  PHOS  --   --  3.1 3.2   CBC  Recent Labs Lab 02/12/17 1856 02/13/17 0827 02/13/17 2119 02/14/17 0604 02/15/17 0259  WBC 8.8 8.5  --  9.3 7.9  NEUTROABS  --   --   --   --  6.1  HGB 9.7* 8.1*  --  7.7* 8.4*  HCT 32.7* 27.6*  --  27.4* 28.4*  MCV 72.5* 73.2*  --  74.3* 74.9*  PLT 68* 63* 75* 112* 102*    Medications:    . carvedilol  25 mg Oral BID WC  . hydrALAZINE  100 mg Oral Q8H  . NIFEdipine  60 mg Oral QHS  . potassium chloride  40 mEq Oral BID      Elmarie Shiley, MD 02/15/2017, 11:20 AM

## 2017-02-15 NOTE — Progress Notes (Signed)
PROGRESS NOTE    Magdalene Tardiff  JOA:[id number] DOB: 1975/07/15 DOA: 02/12/2017 PCP: Patient, No Pcp Per   Brief Narrative:  [name] a 41 y.o.female,w hypertension apparently presents due to epigastric pain for the past 2 days with Slight nausea. In the ER, pt was found to be in ARF, Hypokalemic, Anemic, and with a Trop 0.38, and Hypertensive Emergency. CT Abd/Pelvis showed Gallbladder sludge and possible cholecystitis, a Nonobstructing 4 mm RIGHT nephrolithiasis. 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 She was transferred to [name] for a Nephrology evaluation and Hematology evaluation and found to have a Hemolytic Anemia. Workup currently in progress and Cardiology, Nephrology, and Hematology consulted to evaluate the Patient. Patient's BP has improved but Kidney Function has worsened. Patient was transfused 1 unit of pRBC's today given her SOB and drop in Hb/Hct. CXR was reviewed and it showed Mild CHF and/or fluid overload, with development of mild interstitial pulmonary edema since the examinations 2 days ago so patient was given IV Lasix 20 mg x1.  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 -Smear showed Schistocytes; T Bili was elevated -Haptoglobin was <10, LDH was 682, D-Dimer was 4.40, Fibrinogen was 498, PT was 13.8, INR was 1.07, APTT was 29 -Coombs Test was Negative -Anemia Panel showed Iron level of 33, UIBC 313, TIBC of 346, Saturation Ratios of 10, Ferritin of 14, Vitamin B12 of 2634 -Hb/Hct went from 8.1/27.6 -> 7.7/27.4 -> 8.4/28.4 -S/p Transfusion of 1 unit of pRBC's  -Nephrology ordering  Feraheme -Hematology doesn't feel like this is Autoimmune hemolysis so they do not see any benefits of Steroids -DIC not evident given elevated Fibrinogen  -Hematology recommending Supportive Care and to Transfuse to Maintain Hb > 8.0 -Continue to Monitor and Repeat CBC with Diff and Retic Count in AM -Appreciated Hematology Input and Reccs. Patient will need to follow up with Dr. Burr Medico as an outpatient.   Thrombocytopenia -Platelet Count initially improved from 63 -> 75 -> 112 but is now 102 -Hematology feel TTP an HUS are low possibilities given improving Platelet Count -ADAMTS13 Activity was 79.2 and Negative r/o TTP -Continue to Monitor and Repeat CBC in AM -Appreciated Hematology following and appreciate Recc's  AKI/Acute Renal Failure -Worsening. BUN/Cr went from 32/2.63 -> 38/3.13 -> 39/3.64 -Nephrology Following and appreciate additional Recc's -Nephrology feels like it was most likely hemodynamically mediated with with HTN Emergency and suspect BP control would be associated with slight rise in Cr prior to Eventual Improvement  -Renal U/S Negative for Obstruction and Urine Lytes sent by Nephrology -Nephrology Discontinued IVF -Continue to Monitor and Repeat CMP in AM; Nephrology to decide on further intervention  Hypertensive Emergency, Improved -Patient now off Nicardipine gtt -C/w Carvedilol 25 mg po BID, Hydralazine 100 mg po q8h -IV Lasix 20 mg given 1x; Nephrology to decide on need for scheduled oral Furosemide pending Urine Osmolality result -C/w IV Hydralazine 10 mg q6hprn for SBP >160  Elevated Troponin -Troponin was elevated at 0.44, 0.38, and 0.39 -Cardiology evaluated and feel likley ischemia due to medical illness/ renal failure/ hypertensive emergency -ECHO done and shows -Cardiology recommending conservative approach and considering Outpatient Myoview at D/C -Cardiology Signing off  Mild Hyponatremia -  Sodium went from 134 -> 132 -> 131 -Nephrology following  and 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 -Serum Osmolality was 289 and Urine Osm Still pending -Repeat CMP in AM  Hypokalemia -K+ went from 2.9 -> 3.5 -> 2.9 -Replete with Potassium Chloride 40 mEQ po BID and Nephrology recommending continuing with supplementation  -Nephrology following and feels that is from Uncontrolled HTN/Kaliuresis -Plasma Renin/Aldosterone Ratio pending  -Repeat CMP in AM   Shortness of Breath likely from Volume Overload and low Hb -Patient saturating well on Room Air -Likely feeling SOB from low Hb and Volume Overload -CXR 02/14/17 showed Mild CHF and/or fluid overload, with development of mild interstitial pulmonary edema since the examinations 2 days ago. -Transfused 1 unit of pRBC's yesterday -IVF D/C'd yesterday  -Gave IV Lasix 20 mg x 1 -Repeat CXR in AM   Abdominal Pain/Right Upper Quadrant Pain, improved  -CT Abd/Pelvis showed Gallbladder sludge and possible cholecystitis though, ultrasound would be more sensitive.  Nonobstructing 4 mm RIGHT nephrolithiasis. 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 -U/S of Gallbladder shows Gallbladder sludge. No visible calculi. Normal bile ducts. Negative sonographic Murphy sign. Coarsened hepatic parenchymal echotexture could represent fatty infiltration or hepatocellular disease. -Lipase was Normal -HIDA Scan done and showed Negative for acute cholecystitis. Patent cystic duct. Normal excretion/ transit of radiotracer into the bowel. Decreased gallbladder ejection fraction of 22% suggesting dyskinesia -Abdominal Pain Improved -Will discuss with General Surgery about HIDA findings   Fatty infiltration of liver/Hepatocellular diseases. -Had Mildly increase Bilirubin but now improved to 0.8.  -Recommended, low fat diet, weight lost. Advised to stop drinking alcohol.  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 -Patient aware. Needs to follow with Gynecology  as an outpatient   ? UTI -Urinalysis showed Trace Leukocytes, TNTC RBC, TNTC WBC, and No Bacteria -Was started on Empiric Ceftriaxone yesterday -Will continue for now until Urine Cx Results  DVT prophylaxis: SCDs Code Status: FULL CODE Family Communication: Husband at Bedside Disposition Plan: Remain Inpatient and anticipate D/C in 48-72 Hours if kidney function starts to improve.   Consultants:   Hematology (signed off 9/2)  Nephrology  Cardiology (signed off 9/1)   Procedures: HIDA SCAN   Antimicrobials:  Anti-infectives    Start     Dose/Rate Route Frequency Ordered Stop   02/13/17 1500  cefTRIAXone (ROCEPHIN) 1 g in dextrose 5 % 50 mL IVPB     1 g 100 mL/hr over 30 Minutes Intravenous Every 24 hours 02/13/17 1437       Subjective: Seen and examined at bedside and states she was SOB last night after she received the blood. Thinks SOB was a little better this morning but still had some. No Nausea or Vomiting. No CP or Abdominal Pain. Patient had no other concerns or complaints but was worried about kidney function worsening.   Objective: Vitals:   02/15/17 0700 02/15/17 0755 02/15/17 0800 02/15/17 1200  BP:  (!) 154/98 (!) 149/109 120/87  Pulse:  (!) 106  90  Resp: (!) 21  (!) 21 (!) 22  Temp:   98 F (36.7 C) 98.4 F (36.9 C)  TempSrc:   Oral Oral  SpO2: 96%  97% 90%  Weight:      Height:        Intake/Output Summary (Last 24 hours) at 02/15/17 1255 Last data filed at 02/15/17 1100  Gross per 24 hour  Intake              935  ml  Output                1 ml  Net              934 ml   Filed Weights   02/12/17 1836 02/14/17 0624 02/15/17 0540  Weight: 81.6 kg (180 lb) 75 kg (165 lb 6.4 oz) 76.7 kg (169 lb)   Examination: Physical Exam:  Constitutional: WN/WD obese AAF in NAD sitting bedside Eyes: Sclerae anicteric; Conjunctivae non-injected  ENMT: External ears and nose appear grossly normal. Mucous Membranes are moist Neck: Supple with no  JVD Respiratory: Diminished at the bases bilaterally. No wheezing/rales/rhonchi. Patient was not tachypenic or using any accessory muscles to breathe Cardiovascular: RRR; S1 S2; Had mild pedal edema Abdomen: Soft, NT, ND. Bowel Sounds present GU: Deferred Musculoskeletal: No contractures; No cyanosis Skin: Warm and Dry. No rashes or lesions on a limited skin eval Neurologic: CN 2-12 grossly intact. No appreciable focal deficits. Romberg sign and cerebellar reflexes not assessed Psychiatric: Normal judgement and insight. Pleasant mood and affect  Data Reviewed: I have personally reviewed following labs and imaging studies  CBC:  Recent Labs Lab 02/12/17 1856 02/13/17 0827 02/13/17 2119 02/14/17 0604 02/15/17 0259  WBC 8.8 8.5  --  9.3 7.9  NEUTROABS  --   --   --   --  6.1  HGB 9.7* 8.1*  --  7.7* 8.4*  HCT 32.7* 27.6*  --  27.4* 28.4*  MCV 72.5* 73.2*  --  74.3* 74.9*  PLT 68* 63* 75* 112* 389*   Basic Metabolic Panel:  Recent Labs Lab 02/12/17 1856 02/13/17 0827 02/14/17 0604 02/15/17 0259  NA 134* 134* 132* 131*  K 2.3* 2.9* 3.5 2.9*  CL 99* 101 102 102  CO2 23 22 20* 19*  GLUCOSE 118* 107* 133* 142*  BUN 32* 32* 38* 39*  CREATININE 2.70* 2.63* 3.13* 3.64*  CALCIUM 9.2 8.5* 8.7* 8.6*  MG  --  1.6*  --  2.4  PHOS  --   --  3.1 3.2   GFR: Estimated Creatinine Clearance: 18.8 mL/min (A) (by C-G formula based on SCr of 3.64 mg/dL (H)). Liver Function Tests:  Recent Labs Lab 02/12/17 2032 02/13/17 0820 02/14/17 0604 02/15/17 0259  AST 38 30  --  23  ALT 16 14  --  18  ALKPHOS 53 48  --  46  BILITOT 1.3* 1.1  --  0.8  PROT 7.3 6.7  --  6.1*  ALBUMIN 3.6 3.2* 2.9* 3.0*    Recent Labs Lab 02/12/17 2032  LIPASE 39   No results for input(s): AMMONIA in the last 168 hours. Coagulation Profile:  Recent Labs Lab 02/13/17 2119  INR 1.07   Cardiac Enzymes:  Recent Labs Lab 02/13/17 0827  CKTOTAL 37*  CKMB 4.5  TROPONINI 0.39*   BNP (last 3  results) No results for input(s): PROBNP in the last 8760 hours. HbA1C:  Recent Labs  02/13/17 0827  HGBA1C 4.6*   CBG: No results for input(s): GLUCAP in the last 168 hours. Lipid Profile: No results for input(s): CHOL, HDL, LDLCALC, TRIG, CHOLHDL, LDLDIRECT in the last 72 hours. Thyroid Function Tests:  Recent Labs  02/13/17 0827  TSH 1.062   Anemia Panel:  Recent Labs  02/13/17 0525 02/13/17 2038 02/15/17 0259  VITAMINB12 2,634*  --   --   FERRITIN 14  --   --   TIBC 346  --   --   IRON 33  --   --  RETICCTPCT  --  5.4* 5.1*   Sepsis Labs:  Recent Labs Lab 02/12/17 2056  LATICACIDVEN 1.13    No results found for this or any previous visit (from the past 240 hour(s)).   Radiology Studies: Nm Hepato W/eject Fract  Result Date: 02/13/2017 CLINICAL DATA:  Mid abdominal pain with nausea EXAM: NUCLEAR MEDICINE HEPATOBILIARY IMAGING WITH GALLBLADDER EF TECHNIQUE: Sequential images of the abdomen were obtained out to 60 minutes following intravenous administration of radiopharmaceutical. After oral ingestion of Ensure, gallbladder ejection fraction was determined. At 60 min, normal ejection fraction is greater than 33%. RADIOPHARMACEUTICALS:  5.3 mCi Tc-71m  Choletec IV COMPARISON:  Ultrasound 02/13/2017, CT 02/12/2017 FINDINGS: Prompt uptake and biliary excretion of activity by the liver is seen. Gallbladder activity is visualized, consistent with patency of cystic duct. Biliary activity passes into small bowel, consistent with patent common bile duct. Calculated gallbladder ejection fraction is 22%. (Normal gallbladder ejection fraction with Ensure is greater than 33%.) IMPRESSION: 1. Negative for acute cholecystitis. Patent cystic duct. Normal excretion/ transit of radiotracer into the bowel. 2. Decreased gallbladder ejection fraction of 22% suggesting dyskinesia Electronically Signed   By: Donavan Foil M.D.   On: 02/13/2017 20:32   Dg Chest Port 1 View  Result Date:  02/14/2017 CLINICAL DATA:  Acute onset shortness of breath that began earlier this morning. EXAM: PORTABLE CHEST 1 VIEW COMPARISON:  Chest x-ray and CT chest 02/12/2017. FINDINGS: Cardiac silhouette moderately to markedly enlarged, unchanged. Interval development of mild diffuse interstitial pulmonary edema since the examinations 2 days ago. No confluent airspace consolidation. No visible pleural effusions. IMPRESSION: Mild CHF and/or fluid overload, with development of mild interstitial pulmonary edema since the examinations 2 days ago. Electronically Signed   By: Evangeline Dakin M.D.   On: 02/14/2017 11:19   Scheduled Meds: . carvedilol  25 mg Oral BID WC  . hydrALAZINE  100 mg Oral Q8H  . NIFEdipine  60 mg Oral QHS  . potassium chloride  40 mEq Oral BID   Continuous Infusions: . sodium chloride    . cefTRIAXone (ROCEPHIN)  IV 1 g (02/14/17 1728)    LOS: 3 days   Kerney Elbe, DO [facility] Pager [phone]  If 7PM-7AM, please contact night-coverage [email or web address] Password [facility] 02/15/2017, 12:55 PM

## 2017-02-15 NOTE — Progress Notes (Signed)
Nikiah Goin   DOB:03-Sep-1975   SA#:[id number]   NAT#:[id number]  Hematology follow up note  Subjective: Patient had some dyspnea last night, improved with Lasix. No other new complaints. Hemoglobin improved from 7.7 to 8.4 this morning after 1 unit blood transfusion yesterday. Counts stable at 102K today.    Objective:  Vitals:   02/15/17 0800 02/15/17 1200  BP: (!) 149/109 120/87  Pulse:  90  Resp: (!) 21 (!) 22  Temp: 98 F (36.7 C) 98.4 F (36.9 C)  SpO2: 97% 90%    Body mass index is 33.01 kg/m.  Intake/Output Summary (Last 24 hours) at 02/15/17 1508 Last data filed at 02/15/17 1100  Gross per 24 hour  Intake              825 ml  Output                1 ml  Net              824 ml     Sclerae unicteric  Oropharynx clear  No peripheral adenopathy  Lungs clear -- no rales or rhonchi  Heart regular rate and rhythm  Abdomen soft, mild tenderness in the epigastric area  MSK no focal spinal tenderness, no peripheral edema  Neuro nonfocal   CBG (last 3)  No results for input(s): GLUCAP in the last 72 hours.   Labs:  Lab Results  Component Value Date   WBC 7.9 02/15/2017   HGB 8.4 (L) 02/15/2017   HCT 28.4 (L) 02/15/2017   MCV 74.9 (L) 02/15/2017   PLT 102 (L) 02/15/2017   NEUTROABS 6.1 02/15/2017   CMP Latest Ref Rng & Units 02/15/2017 02/14/2017 02/13/2017  Glucose 65 - 99 mg/dL 142(H) 133(H) 107(H)  BUN 6 - 20 mg/dL 39(H) 38(H) 32(H)  Creatinine 0.44 - 1.00 mg/dL 3.64(H) 3.13(H) 2.63(H)  Sodium 135 - 145 mmol/L 131(L) 132(L) 134(L)  Potassium 3.5 - 5.1 mmol/L 2.9(L) 3.5 2.9(L)  Chloride 101 - 111 mmol/L 102 102 101  CO2 22 - 32 mmol/L 19(L) 20(L) 22  Calcium 8.9 - 10.3 mg/dL 8.6(L) 8.7(L) 8.5(L)  Total Protein 6.5 - 8.1 g/dL 6.1(L) - 6.7  Total Bilirubin 0.3 - 1.2 mg/dL 0.8 - 1.1  Alkaline Phos 38 - 126 U/L 46 - 48  AST 15 - 41 U/L 23 - 30  ALT 14 - 54 U/L 18 - 14    Urine Studies No results for input(s): UHGB, CRYS in the last 72 hours.  Invalid  input(s): UACOL, UAPR, USPG, UPH, UTP, UGL, UKET, UBIL, UNIT, UROB, [place], UEPI, UWBC, [place], [place]  Basic Metabolic Panel:  Recent Labs Lab 02/12/17 1856 02/13/17 0827 02/14/17 0604 02/15/17 0259  NA 134* 134* 132* 131*  K 2.3* 2.9* 3.5 2.9*  CL 99* 101 102 102  CO2 23 22 20* 19*  GLUCOSE 118* 107* 133* 142*  BUN 32* 32* 38* 39*  CREATININE 2.70* 2.63* 3.13* 3.64*  CALCIUM 9.2 8.5* 8.7* 8.6*  MG  --  1.6*  --  2.4  PHOS  --   --  3.1 3.2   GFR Estimated Creatinine Clearance: 18.8 mL/min (A) (by C-G formula based on SCr of 3.64 mg/dL (H)). Liver Function Tests:  Recent Labs Lab 02/12/17 2032 02/13/17 0820 02/14/17 0604 02/15/17 0259  AST 38 30  --  23  ALT 16 14  --  18  ALKPHOS 53 48  --  46  BILITOT 1.3* 1.1  --  0.8  PROT 7.3 6.7  --  6.1*  ALBUMIN 3.6 3.2* 2.9* 3.0*    Recent Labs Lab 02/12/17 2032  LIPASE 39   No results for input(s): AMMONIA in the last 168 hours. Coagulation profile  Recent Labs Lab 02/13/17 2119  INR 1.07    CBC:  Recent Labs Lab 02/12/17 1856 02/13/17 0827 02/13/17 2119 02/14/17 0604 02/15/17 0259  WBC 8.8 8.5  --  9.3 7.9  NEUTROABS  --   --   --   --  6.1  HGB 9.7* 8.1*  --  7.7* 8.4*  HCT 32.7* 27.6*  --  27.4* 28.4*  MCV 72.5* 73.2*  --  74.3* 74.9*  PLT 68* 63* 75* 112* 102*   Cardiac Enzymes:  Recent Labs Lab 02/13/17 0827  CKTOTAL 37*  CKMB 4.5  TROPONINI 0.39*   BNP: Invalid input(s): POCBNP CBG: No results for input(s): GLUCAP in the last 168 hours. D-Dimer  Recent Labs  02/13/17 2119  DDIMER 4.40*   Hgb A1c  Recent Labs  02/13/17 0827  HGBA1C 4.6*   Lipid Profile No results for input(s): CHOL, HDL, LDLCALC, TRIG, CHOLHDL, LDLDIRECT in the last 72 hours. Thyroid function studies  Recent Labs  02/13/17 0827  TSH 1.062   Anemia work up  Recent Labs  02/13/17 0525 02/13/17 2038 02/15/17 0259  VITAMINB12 2,634*  --   --   FERRITIN 14  --   --   TIBC 346  --    --   IRON 33  --   --   RETICCTPCT  --  5.4* 5.1*   Microbiology No results found for this or any previous visit (from the past 240 hour(s)).    Studies:  Nm Hepato W/eject Fract  Result Date: 02/13/2017 CLINICAL DATA:  Mid abdominal pain with nausea EXAM: NUCLEAR MEDICINE HEPATOBILIARY IMAGING WITH GALLBLADDER EF TECHNIQUE: Sequential images of the abdomen were obtained out to 60 minutes following intravenous administration of radiopharmaceutical. After oral ingestion of Ensure, gallbladder ejection fraction was determined. At 60 min, normal ejection fraction is greater than 33%. RADIOPHARMACEUTICALS:  5.3 mCi Tc-26m  Choletec IV COMPARISON:  Ultrasound 02/13/2017, CT 02/12/2017 FINDINGS: Prompt uptake and biliary excretion of activity by the liver is seen. Gallbladder activity is visualized, consistent with patency of cystic duct. Biliary activity passes into small bowel, consistent with patent common bile duct. Calculated gallbladder ejection fraction is 22%. (Normal gallbladder ejection fraction with Ensure is greater than 33%.) IMPRESSION: 1. Negative for acute cholecystitis. Patent cystic duct. Normal excretion/ transit of radiotracer into the bowel. 2. Decreased gallbladder ejection fraction of 22% suggesting dyskinesia Electronically Signed   By: Donavan Foil M.D.   On: 02/13/2017 20:32   Dg Chest Port 1 View  Result Date: 02/14/2017 CLINICAL DATA:  Acute onset shortness of breath that began earlier this morning. EXAM: PORTABLE CHEST 1 VIEW COMPARISON:  Chest x-ray and CT chest 02/12/2017. FINDINGS: Cardiac silhouette moderately to markedly enlarged, unchanged. Interval development of mild diffuse interstitial pulmonary edema since the examinations 2 days ago. No confluent airspace consolidation. No visible pleural effusions. IMPRESSION: Mild CHF and/or fluid overload, with development of mild interstitial pulmonary edema since the examinations 2 days ago. Electronically Signed   By: Evangeline Dakin M.D.   On: 02/14/2017 11:19    Assessment: 41 y.o.  African-American female, with past medical history of hypertension, presented with epigastric pain, was found to have a significantly elevated blood pressure, positive troponin, acute renal hernia, worsening anemia and moderate some osteopenia.  1. Hemolytic anemia and thrombocytopenia 2. AKI  3. Hypertension emergency, improving  4. Positive troponin, likely demanding ischemia from hypertension and anemia  5. 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 Epigastric pain.  7. Hypokalemia   Recommendations: -Patient has lab evidence of microangiopathic hemolytic anemia (elevated indirect bilirubin, LDH, numerous schistocytes on the initial smear, and low haptoglobin), Coombs test was negative, no evidence of autoimmune hemolysis.  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  -continue supportive care, transfusion to keep Hb above 8.0 -she received one dose iv Feraheme, consider repeating one more does next week  -I will follow up as needed. Please call me if there is any questions.  --other management per primary team and renal. She does not have insurance or PCP, please arrange upon her discharge. She knows to call me for follow up appointment after discharge.     Truitt Merle, MD 02/15/2017  3:08 PM

## 2017-02-16 LAB — COMPREHENSIVE METABOLIC PANEL
ALBUMIN: 3.1 g/dL — AB (ref 3.5–5.0)
ALK PHOS: 53 U/L (ref 38–126)
ALT: 24 U/L (ref 14–54)
AST: 24 U/L (ref 15–41)
Anion gap: 11 (ref 5–15)
BILIRUBIN TOTAL: 1 mg/dL (ref 0.3–1.2)
BUN: 40 mg/dL — AB (ref 6–20)
CALCIUM: 8.8 mg/dL — AB (ref 8.9–10.3)
CO2: 19 mmol/L — ABNORMAL LOW (ref 22–32)
Chloride: 105 mmol/L (ref 101–111)
Creatinine, Ser: 3.52 mg/dL — ABNORMAL HIGH (ref 0.44–1.00)
GFR calc Af Amer: 18 mL/min — ABNORMAL LOW (ref >60)
GFR calc non Af Amer: 15 mL/min — ABNORMAL LOW (ref >60)
GLUCOSE: 117 mg/dL — AB (ref 65–99)
Potassium: 3.1 mmol/L — ABNORMAL LOW (ref 3.5–5.1)
SODIUM: 135 mmol/L (ref 135–145)
TOTAL PROTEIN: 6.4 g/dL — AB (ref 6.5–8.1)

## 2017-02-16 LAB — MAGNESIUM: Magnesium: 2.1 mg/dL (ref 1.7–2.4)

## 2017-02-16 LAB — CBC WITH DIFFERENTIAL/PLATELET
BASOS ABS: 0 10*3/uL (ref 0.0–0.1)
BASOS PCT: 0 %
EOS PCT: 1 %
Eosinophils Absolute: 0.1 10*3/uL (ref 0.0–0.7)
HEMATOCRIT: 29.5 % — AB (ref 36.0–46.0)
Hemoglobin: 8.6 g/dL — ABNORMAL LOW (ref 12.0–15.0)
LYMPHS ABS: 1.4 10*3/uL (ref 0.7–4.0)
Lymphocytes Relative: 16 %
MCH: 22.3 pg — ABNORMAL LOW (ref 26.0–34.0)
MCHC: 29.2 g/dL — ABNORMAL LOW (ref 30.0–36.0)
MCV: 76.4 fL — ABNORMAL LOW (ref 78.0–100.0)
MONO ABS: 0.4 10*3/uL (ref 0.1–1.0)
MONOS PCT: 5 %
Neutro Abs: 6.7 10*3/uL (ref 1.7–7.7)
Neutrophils Relative %: 78 %
Platelets: 123 10*3/uL — ABNORMAL LOW (ref 150–400)
RBC: 3.86 MIL/uL — AB (ref 3.87–5.11)
RDW: 22.2 % — ABNORMAL HIGH (ref 11.5–15.5)
WBC: 8.6 10*3/uL (ref 4.0–10.5)

## 2017-02-16 LAB — PHOSPHORUS: PHOSPHORUS: 4 mg/dL (ref 2.5–4.6)

## 2017-02-16 MED ORDER — AMLODIPINE BESYLATE 5 MG PO TABS: 5.0000 mg | ORAL_TABLET | Freq: Every day | ORAL | Status: DC

## 2017-02-16 MED ORDER — POTASSIUM CHLORIDE 20 MEQ/15ML (10%) PO SOLN
40.0000 meq | Freq: Once | ORAL | Status: AC
  Administered 2017-02-16: 40 meq via ORAL
  Filled 2017-02-16: qty 30

## 2017-02-16 MED ORDER — FUROSEMIDE 10 MG/ML IJ SOLN
100.0000 mg | Freq: Once | INTRAVENOUS | Status: AC
  Administered 2017-02-16: 100 mg via INTRAVENOUS
  Filled 2017-02-16: qty 10

## 2017-02-16 NOTE — Progress Notes (Signed)
Assessment/ Plan:   1.Hypertensive emergency:Need better control, has vol overload-Add furosemide IV 2. Acute kidney injury: BP control and TMA 3. Anemia/thrombocytopenia c/w thrombotic microangiopathy 4.Hypokalemia:aldo related  5.Hyponatremia: vol xs    Subjective: Interval History: SOB, DOE, orthopnea  Objective: Vital signs in last 24 hours: Temp:  [98 F (36.7 C)-99.6 F (37.6 C)] 99.6 F (37.6 C) (09/03 0745) Pulse Rate:  [76-123] 115 (09/03 0315) Resp:  [22-29] 26 (09/03 0745) BP: (120-182)/(87-122) 160/108 (09/03 0745) SpO2:  [90 %-97 %] 95 % (09/03 0745) Weight:  [77.2 kg (170 lb 1.6 oz)] 77.2 kg (170 lb 1.6 oz) (09/03 0514) Weight change: 0.499 kg (1 lb 1.6 oz)  Intake/Output from previous day: 09/02 0701 - 09/03 0700 In: 710 [P.O.:710] Out: 4 [Urine:2; Stool:2] Intake/Output this shift: Total I/O In: 720 [P.O.:720] Out: -   General appearance: alert and cooperative Resp: diminished breath sounds bibasilar Chest wall: no tenderness Cardio: regular rate and rhythm, S1, S2 normal, no murmur, click, rub or gallop Extremities: edema 1+  Lab Results:  Recent Labs  02/15/17 0259 02/16/17 0243  WBC 7.9 8.6  HGB 8.4* 8.6*  HCT 28.4* 29.5*  PLT 102* 123*   BMET:  Recent Labs  02/15/17 0259 02/16/17 0243  NA 131* 135  K 2.9* 3.1*  CL 102 105  CO2 19* 19*  GLUCOSE 142* 117*  BUN 39* 40*  CREATININE 3.64* 3.52*  CALCIUM 8.6* 8.8*   No results for input(s): PTH in the last 72 hours. Iron Studies: No results for input(s): IRON, TIBC, TRANSFERRIN, FERRITIN in the last 72 hours. Studies/Results: No results found.  Scheduled: . carvedilol  25 mg Oral BID WC  . hydrALAZINE  100 mg Oral Q8H  . NIFEdipine  60 mg Oral QHS  . potassium chloride  40 mEq Oral BID     LOS: 4 days   Madeline Brown C 02/16/2017,11:02 AM

## 2017-02-16 NOTE — Progress Notes (Signed)
PROGRESS NOTE    Madeline Brown  EYC:[id number] DOB: [birth year] DOA: 02/12/2017 PCP: Patient, No Pcp Per   Brief Narrative:  Madeline Brown a 41 y.o.female,w hypertension apparently presents due to epigastric pain for the past 2 days with Slight nausea. In the ER, pt was found to be in ARF, Hypokalemic, Anemic, and with a Trop 0.38, and Hypertensive Emergency. CT Abd/Pelvis showed Gallbladder sludge and possible cholecystitis, a Nonobstructing 4 mm RIGHT nephrolithiasis. 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 She was transferred to [name] for a Nephrology evaluation and Hematology evaluation and found to have a Hemolytic Anemia. Workup currently in progress and Cardiology, Nephrology, and Hematology consulted to evaluate the Patient. Patient's BP has improved but Kidney Function has worsened. Patient was transfused 1 unit of pRBC's today given her SOB and drop in Hb/Hct. CXR was reviewed and it showed Mild CHF and/or fluid overload, with development of mild interstitial pulmonary edema since the examinations 2 days ago so patient was given IV Lasix 20 mg x1. 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 -Smear showed Schistocytes; T Bili was elevated -Haptoglobin was <10, LDH was 682, D-Dimer was 4.40, Fibrinogen was 498, PT was 13.8, INR was 1.07, APTT was 29 -Coombs Test was Negative -Anemia Panel showed Iron level of 33, UIBC 313, TIBC of 346, Saturation Ratios of 10, Ferritin of 14, Vitamin B12 of 2634 -Hb/Hct went from 8.1/27.6 ->  7.7/27.4 -> 8.4/28.4 -> 8.6/29.5 -S/p Transfusion of 1 unit of pRBC's  -Nephrology ordering Feraheme -Hematology doesn't feel like this is Autoimmune hemolysis so they do not see any benefits of Steroids -DIC not evident given elevated Fibrinogen  -Hematology recommending Supportive Care and to Transfuse to Maintain Hb > 8.0 -Continue to Monitor and Repeat CBC with Diff and Retic Count in AM -Appreciated Hematology Input and Reccs. Patient will need to follow up with Dr. Burr Medico as an outpatient.   Thrombocytopenia -Platelet Count improved to 123 -Hematology feel TTP an HUS are low possibilities given improving Platelet Count -ADAMTS13 Activity was 79.2 and Negative r/o TTP -Continue to Monitor and Repeat CBC in AM -Appreciated Hematology following and appreciate Recc's  AKI/Acute Renal Failure -Worsening. BUN/Cr went from 32/2.63 -> 38/3.13 -> 39/3.64 -> 40/3.52 -Nephrology Following and appreciate additional Recc's -Nephrology feels like it was most likely hemodynamically mediated with with HTN Emergency and suspect BP control would be associated with slight rise in Cr prior to Eventual Improvement  -Renal U/S Negative for Obstruction and Urine Lytes sent by Nephrology -Nephrology Discontinued IVF; Giving IV Lasix 100 mg x 1 today -Continue to Monitor and Repeat CMP in AM; Nephrology to decide on further intervention  Hypertensive Emergency, Improved -Patient now off Nicardipine gtt -C/w Carvedilol 25 mg po BID, Hydralazine 100 mg po q8h and Nifedipine 60 mg po Daily qHS  -IV Lasix 20 mg given 1x on 02/15/17; Nephrology to decide on need for scheduled oral Furosemide pending Urine Osmolality result and ordered IV Lasix 100 mg of Lasix  -C/w IV Hydralazine 10 mg q6hprn for SBP >160  Elevated Troponin -Troponin was elevated at 0.44, 0.38,  and 0.39 -Cardiology evaluated and feel likley ischemia due to medical illness/ renal failure/ hypertensive emergency -ECHO done and shows -Cardiology  recommending conservative approach and considering Outpatient Myoview at D/C -Cardiology Signing off  Mild Hyponatremia, improved after Diuresis -Likely from Volume Excess -Sodium went from 134 -> 132 -> 131 -> 135 -Nephrology following and 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 -Serum Osmolality was 289 and Urine Osm was 415  -Repeat CMP in AM  Hypokalemia -K+ went from 2.9 -> 3.5 -> 2.9 -> 3.1 -Continue to Replete with Potassium Chloride 40 mEQ po BID and Nephrology recommending continuing with supplementation  -Nephrology following and feels that is from Uncontrolled HTN/Kaliuresis -Plasma Renin/Aldosterone Ratio pending  -Repeat CMP in AM   Shortness of Breath likely from Volume Overload and low Hb -Patient saturating well on Room Air -Likely feeling SOB from low Hb and Volume Overload -CXR 02/14/17 showed Mild CHF and/or fluid overload, with development of mild interstitial pulmonary edema since the examinations 2 days ago. -Transfused 1 unit of pRBC's 02/14/17 -IVF D/C'd  -Gave IV Lasix 20 mg x 1 on 02/15/17. Nephrology giving 100 mg of IV Lasix -Repeat CXR in AM   Abdominal Pain/Right Upper Quadrant Pain, improved  -CT Abd/Pelvis showed Gallbladder sludge and possible cholecystitis though, ultrasound would be more sensitive.  Nonobstructing 4 mm RIGHT nephrolithiasis. 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 -U/S of Gallbladder shows Gallbladder sludge. No visible calculi. Normal bile ducts. Negative sonographic Murphy sign. Coarsened hepatic parenchymal echotexture could represent fatty infiltration or hepatocellular disease. -Lipase was Normal -HIDA Scan done and showed Negative for acute cholecystitis. Patent cystic duct. Normal excretion/ transit of radiotracer into the bowel. Decreased gallbladder ejection fraction of 22% suggesting dyskinesia -Abdominal Pain Improved -Will discuss with General Surgery about HIDA findings   Fatty infiltration of liver/Hepatocellular  diseases. -Had Mildly increase Bilirubin but now is 1.0 -Recommended, low fat diet, weight lost. Advised to stop drinking alcohol.  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 -Patient aware. Needs to follow with Gynecology as an outpatient   ? UTI -Urinalysis showed Trace Leukocytes, TNTC RBC, TNTC WBC, and No Bacteria -Was started on Empiric Ceftriaxone 8/31 and will continue until tomorrow for 5 day course   DVT prophylaxis: SCDs Code Status: FULL CODE Family Communication: Husband at Bedside Disposition Plan: Remain Inpatient and anticipate D/C in 48-72 Hours if kidney function starts to improve.   Consultants:   Hematology (signed off 9/2)  Nephrology  Cardiology (signed off 9/1)   Procedures: HIDA SCAN   Antimicrobials:  Anti-infectives    Start     Dose/Rate Route Frequency Ordered Stop   02/13/17 1500  cefTRIAXone (ROCEPHIN) 1 g in dextrose 5 % 50 mL IVPB     1 g 100 mL/hr over 30 Minutes Intravenous Every 24 hours 02/13/17 1437       Subjective: Seen and examined at bedside and states SOB is better. No nausea or vomiting and Abdomen pain is resolved but is mildly tender to palpation. No other concerns or complaints at this time.   Objective: Vitals:   02/16/17 0000 02/16/17 0315 02/16/17 0514 02/16/17 0745  BP: (!) 164/109 (!) 167/113  (!) 160/108  Pulse: (!) 123 (!) 115    Resp: (!) 29 (!) 27  (!) 26  Temp: 99.5 F (37.5 C) 99.1 F (37.3 C)  99.6 F (37.6 C)  TempSrc: Oral Oral  Oral  SpO2: 94% 96%  95%  Weight:   77.2 kg (  170 lb 1.6 oz)   Height:        Intake/Output Summary (Last 24 hours) at 02/16/17 1654 Last data filed at 02/16/17 1055  Gross per 24 hour  Intake             1080 ml  Output                4 ml  Net             1076 ml   Filed Weights   02/14/17 0624 02/15/17 0540 02/16/17 0514  Weight: 75 kg (165 lb 6.4 oz) 76.7 kg (169 lb) 77.2 kg (170 lb 1.6 oz)    Examination: Physical Exam:  Constitutional: WN/WD obese AAF in NAD sitting in bed.  Eyes: Sclerae anicteric; Conjunctivae non-injected ENMT: External ears and nose appear normal. Mucous membranes are moist Neck: Supple with no JVD. Respiratory: Diminished bibasilar bilaterally. No wheezing/rales/rhonchi. Patient was not tachypenic or using any accessory muscles to breathe Cardiovascular: RRR; S1, S2. Has pedal Edema Abdomen: Soft, Mildly tender in RUQ, Bowel sounds present GU: Deferred Musculoskeletal: No contractures; No cyanosis Skin: Warm and Dry. No rashes or lesions on a limited skin eval Neurologic: CN 2-12 grossly intact. No appreciable focal deficits Psychiatric: Pleasant mood and affect. Intact judgement and insight  Data Reviewed: I have personally reviewed following labs and imaging studies  CBC:  Recent Labs Lab 02/12/17 1856 02/13/17 0827 02/13/17 2119 02/14/17 0604 02/15/17 0259 02/16/17 0243  WBC 8.8 8.5  --  9.3 7.9 8.6  NEUTROABS  --   --   --   --  6.1 6.7  HGB 9.7* 8.1*  --  7.7* 8.4* 8.6*  HCT 32.7* 27.6*  --  27.4* 28.4* 29.5*  MCV 72.5* 73.2*  --  74.3* 74.9* 76.4*  PLT 68* 63* 75* 112* 102* 361*   Basic Metabolic Panel:  Recent Labs Lab 02/12/17 1856 02/13/17 0827 02/14/17 0604 02/15/17 0259 02/16/17 0243  NA 134* 134* 132* 131* 135  K 2.3* 2.9* 3.5 2.9* 3.1*  CL 99* 101 102 102 105  CO2 23 22 20* 19* 19*  GLUCOSE 118* 107* 133* 142* 117*  BUN 32* 32* 38* 39* 40*  CREATININE 2.70* 2.63* 3.13* 3.64* 3.52*  CALCIUM 9.2 8.5* 8.7* 8.6* 8.8*  MG  --  1.6*  --  2.4 2.1  PHOS  --   --  3.1 3.2 4.0   GFR: Estimated Creatinine Clearance: 19.5 mL/min (A) (by C-G formula based on SCr of 3.52 mg/dL (H)). Liver Function Tests:  Recent Labs Lab 02/12/17 2032 02/13/17 0820 02/14/17 0604 02/15/17 0259 02/16/17 0243  AST 38 30  --  23 24  ALT 16 14  --  18 24  ALKPHOS 53 48  --  46 53  BILITOT 1.3* 1.1  --  0.8 1.0  PROT 7.3 6.7  --   6.1* 6.4*  ALBUMIN 3.6 3.2* 2.9* 3.0* 3.1*    Recent Labs Lab 02/12/17 2032  LIPASE 39   No results for input(s): AMMONIA in the last 168 hours. Coagulation Profile:  Recent Labs Lab 02/13/17 2119  INR 1.07   Cardiac Enzymes:  Recent Labs Lab 02/13/17 0827  CKTOTAL 37*  CKMB 4.5  TROPONINI 0.39*   BNP (last 3 results) No results for input(s): PROBNP in the last 8760 hours. HbA1C: No results for input(s): HGBA1C in the last 72 hours. CBG: No results for input(s): GLUCAP in the last 168 hours. Lipid Profile: No results for input(s):  CHOL, HDL, LDLCALC, TRIG, CHOLHDL, LDLDIRECT in the last 72 hours. Thyroid Function Tests: No results for input(s): TSH, T4TOTAL, FREET4, T3FREE, THYROIDAB in the last 72 hours. Anemia Panel:  Recent Labs  02/13/17 2038 02/15/17 0259  RETICCTPCT 5.4* 5.1*   Sepsis Labs:  Recent Labs Lab 02/12/17 2056  LATICACIDVEN 1.13    No results found for this or any previous visit (from the past 240 hour(s)).   Radiology Studies: No results found. Scheduled Meds: . carvedilol  25 mg Oral BID WC  . hydrALAZINE  100 mg Oral Q8H  . NIFEdipine  60 mg Oral QHS  . potassium chloride  40 mEq Oral BID   Continuous Infusions: . sodium chloride    . cefTRIAXone (ROCEPHIN)  IV Stopped (02/16/17 1627)    LOS: 4 days   Kerney Elbe, DO [facility] Pager [phone]  If 7PM-7AM, please contact night-coverage [email or web address] Password [facility] 02/16/2017, 4:54 PM

## 2017-02-17 ENCOUNTER — Inpatient Hospital Stay (HOSPITAL_COMMUNITY): Payer: Medicaid Other

## 2017-02-17 DIAGNOSIS — N170 Acute kidney failure with tubular necrosis: Secondary | ICD-10-CM

## 2017-02-17 LAB — COMPREHENSIVE METABOLIC PANEL
ALBUMIN: 3 g/dL — AB (ref 3.5–5.0)
ALT: 28 U/L (ref 14–54)
ANION GAP: 10 (ref 5–15)
AST: 24 U/L (ref 15–41)
Alkaline Phosphatase: 45 U/L (ref 38–126)
BUN: 40 mg/dL — ABNORMAL HIGH (ref 6–20)
CHLORIDE: 106 mmol/L (ref 101–111)
CO2: 19 mmol/L — AB (ref 22–32)
Calcium: 8.9 mg/dL (ref 8.9–10.3)
Creatinine, Ser: 3.51 mg/dL — ABNORMAL HIGH (ref 0.44–1.00)
GFR calc non Af Amer: 15 mL/min — ABNORMAL LOW (ref >60)
GFR, EST AFRICAN AMERICAN: 18 mL/min — AB (ref >60)
Glucose, Bld: 143 mg/dL — ABNORMAL HIGH (ref 65–99)
Potassium: 3.5 mmol/L (ref 3.5–5.1)
SODIUM: 135 mmol/L (ref 135–145)
Total Bilirubin: 0.9 mg/dL (ref 0.3–1.2)
Total Protein: 6.2 g/dL — ABNORMAL LOW (ref 6.5–8.1)

## 2017-02-17 LAB — CBC WITH DIFFERENTIAL/PLATELET
BASOS ABS: 0 10*3/uL (ref 0.0–0.1)
BASOS PCT: 0 %
EOS ABS: 0.1 10*3/uL (ref 0.0–0.7)
Eosinophils Relative: 2 %
HCT: 31.3 % — ABNORMAL LOW (ref 36.0–46.0)
Hemoglobin: 9 g/dL — ABNORMAL LOW (ref 12.0–15.0)
Lymphocytes Relative: 19 %
Lymphs Abs: 1.3 10*3/uL (ref 0.7–4.0)
MCH: 22.4 pg — AB (ref 26.0–34.0)
MCHC: 28.8 g/dL — ABNORMAL LOW (ref 30.0–36.0)
MCV: 77.9 fL — ABNORMAL LOW (ref 78.0–100.0)
MONO ABS: 0.4 10*3/uL (ref 0.1–1.0)
Monocytes Relative: 6 %
NEUTROS PCT: 73 %
Neutro Abs: 4.8 10*3/uL (ref 1.7–7.7)
Platelets: 140 10*3/uL — ABNORMAL LOW (ref 150–400)
RBC: 4.02 MIL/uL (ref 3.87–5.11)
RDW: 22.7 % — ABNORMAL HIGH (ref 11.5–15.5)
WBC: 6.6 10*3/uL (ref 4.0–10.5)

## 2017-02-17 LAB — MAGNESIUM: Magnesium: 2 mg/dL (ref 1.7–2.4)

## 2017-02-17 LAB — RETICULOCYTES
RBC.: 4.02 MIL/uL (ref 3.87–5.11)
RETIC CT PCT: 6.1 % — AB (ref 0.4–3.1)
Retic Count, Absolute: 245.2 10*3/uL — ABNORMAL HIGH (ref 19.0–186.0)

## 2017-02-17 LAB — FOLATE RBC
FOLATE, RBC: 1461 ng/mL (ref >498)
Folate, Hemolysate: 401.7 ng/mL
Hematocrit: 27.5 % — ABNORMAL LOW (ref 34.0–46.6)

## 2017-02-17 LAB — PHOSPHORUS: PHOSPHORUS: 4.1 mg/dL (ref 2.5–4.6)

## 2017-02-17 MED ORDER — FUROSEMIDE 10 MG/ML IJ SOLN
100.0000 mg | Freq: Two times a day (BID) | INTRAVENOUS | Status: DC
  Administered 2017-02-17 (×2): 100 mg via INTRAVENOUS
  Filled 2017-02-17 (×3): qty 10

## 2017-02-17 MED ORDER — HYDRALAZINE HCL 20 MG/ML IJ SOLN
10.0000 mg | INTRAMUSCULAR | Status: DC | PRN
  Administered 2017-02-17 – 2017-02-18 (×2): 10 mg via INTRAVENOUS
  Filled 2017-02-17 (×2): qty 1

## 2017-02-17 MED ORDER — LORAZEPAM 2 MG/ML IJ SOLN
0.5000 mg | Freq: Once | INTRAMUSCULAR | Status: DC | PRN
Start: 2017-02-17 — End: 2017-02-19

## 2017-02-17 NOTE — Progress Notes (Addendum)
Late entry: assumed care from off going RN; patient alert & oriented during shift; patient denies CP, SOB; and no signs of acute distress; patient educated about safety; patient ambulates in room with no signs of SOB; BP increased throughout shift; Hydralazine given as scheduled and PRN dose; patient had brady down in the 40's - 30's and up  in chair on tele; MD paged @1632 ; MD round and at bedside @ 1637; see epic for new orders

## 2017-02-17 NOTE — Progress Notes (Signed)
PROGRESS NOTE    Madeline Brown  OEV:[id number] DOB: [birth year] DOA: 02/12/2017 PCP: Patient, No Pcp Per   Brief Narrative:  Madeline Brown a 41 y.o.female,with uncontrolled hypertension presented due to epigastric pain for the past 2 days with Slight nausea. In the ER, pt was found to be in ARF, Hypokalemic, Anemic, and with a Trop 0.38, and Hypertensive Emergency. CT Abd/Pelvis showed Gallbladder sludge and possible cholecystitis, a Nonobstructing 4 mm RIGHT nephrolithiasis. 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 She was transferred to [name] for a Nephrology evaluation and Hematology evaluation and found to have a Hemolytic Anemia. Workup currently in progress and Cardiology, Nephrology, and Hematology consulted to evaluate the Patient. Patient's BP has improved but Kidney Function has worsened. Patient was transfused 1 unit of pRBC's today given her SOB and drop in Hb/Hct. CXR was reviewed and it showed Mild CHF and/or fluid overload, with development of mild interstitial pulmonary edema since the examinations 2 days ago so patient was given IV Lasix 20 mg x1 on 02/15/17. Nephrology evaluated and gave IV Lasix 100 mg for Volume Overload on 02/16/17 and today Nephrology gave 100 mg of Lasix q12h.   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 -Smear showed Schistocytes; T Bili was elevated -Haptoglobin was <10, LDH was 682, D-Dimer was 4.40, Fibrinogen was 498, PT was 13.8, INR was 1.07, APTT was 29 -Coombs Test was Negative -Anemia Panel showed Iron level of 33, UIBC 313, TIBC of 346, Saturation Ratios of 10, Ferritin of 14,  Vitamin B12 of 2634 -Hb/Hct now stable at 9.0/31.3 -S/p Transfusion of 1 unit of pRBC's  -Nephrology ordering Feraheme -Hematology doesn't feel like this is Autoimmune hemolysis so they do not see any benefits of Steroids -DIC not evident given elevated Fibrinogen  -Hematology recommending Supportive Care and to Transfuse to Maintain Hb > 8.0 -Continue to Monitor and Repeat CBC with Diff and Retic Count in AM -Appreciated Hematology Input and Reccs. Patient will need to follow up with Dr. Burr Medico as an outpatient.   Thrombocytopenia -Platelet Count improved to 140 -Hematology feel TTP an HUS are low possibilities given improving Platelet Count -ADAMTS13 Activity was 79.2 and Negative r/o TTP -Continue to Monitor and Repeat CBC in AM -Appreciated Hematology following and appreciate Recc's  AKI/Acute Renal Failure -BUN/Cr went from 32/2.63 -> 38/3.13 -> 39/3.64 -> 40/3.52 -> 40/3.51 -Nephrology Following and appreciate additional Recc's -Nephrology feels like it was most likely hemodynamically mediated with with HTN Emergency and suspect BP control would be associated with slight rise in Cr prior to Eventual Improvement  -Renal U/S Negative for Obstruction and Urine Lytes sent by Nephrology -Nephrology Discontinued IVF; Giving IV Lasix 100 mg q12h -Continue to Monitor and Repeat CMP in AM; Nephrology to decide on further intervention  Hypertensive Emergency, Improved -Patient now off Nicardipine gtt -C/w Carvedilol 25 mg po BID, Hydralazine 100 mg po q8h and Nifedipine 60 mg po Daily qHS  -IV Lasix 20 mg given 1x on 02/15/17; Nephrology to decide on need for scheduled oral Furosemide pending Urine Osmolality result and ordered IV Lasix 100 mg of Lasix on 9/318 -Gave IV Lasix 100 mg q12h today -Changed IV Hydralazine 10 mg  q6hprn for SBP >160 to 10 mg q4hprn for SBP >160  Elevated Troponin -Troponin was elevated at 0.44, 0.38, and 0.39 -Cardiology evaluated and feel likley ischemia due to  medical illness/ renal failure/ hypertensive emergency -ECHO done and shows -Cardiology recommending conservative approach and considering Outpatient Myoview at D/C -Cardiology Signed off  Mild Hyponatremia, improved after Diuresis -Likely from Volume Excess -Sodium went from 134 -> 132 -> 131 -> 135 -Nephrology following and 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 -Serum Osmolality was 289 and Urine Osm was 415  -Repeat CMP in AM  Hypokalemia, improved -K+ went from 2.9 -> 3.1 -> 3.5 -Continue to Replete with Potassium Chloride 40 mEQ po BID and Nephrology recommending continuing with supplementation  -Nephrology following and feels that is from Uncontrolled HTN/Kaliuresis -Plasma Renin/Aldosterone Ratio pending  -Repeat CMP in AM   Shortness of Breath likely from Volume Overload and low Hb -Patient saturating well on Room Air -Likely feeling SOB from low Hb and Volume Overload -CXR 02/14/17 showed Mild CHF and/or fluid overload, with development of mild interstitial pulmonary edema since the examinations 2 days ago. -Transfused 1 unit of pRBC's 02/14/17 -IVF D/C'd  -IV Lasix 100 mg q12h  -Repeat CXR this AM showed Cardiomegaly with interval trace effusions. Mild septal thickening, improved. Stable aortic and hilar contours  Abdominal Pain/Right Upper Quadrant Pain, improved  -CT Abd/Pelvis showed Gallbladder sludge and possible cholecystitis though, ultrasound would be more sensitive.  Nonobstructing 4 mm RIGHT nephrolithiasis. 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 -U/S of Gallbladder shows Gallbladder sludge. No visible calculi. Normal bile ducts. Negative sonographic Murphy sign. Coarsened hepatic parenchymal echotexture could represent fatty infiltration or hepatocellular disease. -Lipase was Normal -HIDA Scan done and showed Negative for acute cholecystitis. Patent cystic duct. Normal excretion/ transit of radiotracer into the bowel. Decreased gallbladder ejection  fraction of 22% suggesting dyskinesia -Abdominal Pain Improved -Will need to discuss with General Surgery about HIDA findings but likely needs outpatient evaluation for possible Cholecystectomy   Fatty infiltration of liver/Hepatocellular diseases. -Had Mildly increase Bilirubin initially but now is 0.9 -Recommended, low fat diet, weight lost. Advised to stop drinking alcohol.  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 -Patient aware. Needs to follow with Gynecology as an outpatient   ? UTI vs. Asymptomatic Pyuria  -Urinalysis showed Trace Leukocytes, TNTC RBC, TNTC WBC, and No Bacteria -Was started on Empiric Ceftriaxone 8/31 and will continue until tomorrow for 5 day course   DVT prophylaxis: SCDs Code Status: FULL CODE Family Communication: Husband at Bedside Disposition Plan: Remain Inpatient and anticipate D/C in 24-48 Hours if kidney function starts to improve.   Consultants:   Hematology (signed off 9/2)  Nephrology  Cardiology (signed off 9/1)   Procedures: HIDA SCAN   Antimicrobials:  Anti-infectives    Start     Dose/Rate Route Frequency Ordered Stop   02/13/17 1500  cefTRIAXone (ROCEPHIN) 1 g in dextrose 5 % 50 mL IVPB     1 g 100 mL/hr over 30 Minutes Intravenous Every 24 hours 02/13/17 1437       Subjective: Seen and examined at bedside and was wanting to know when she could go home. Felt SOB last night and feels like her legs and ankles are puffy. No nausea or Vomiting. Abdominal Pain has resolved.   Objective: Vitals:   02/17/17 1500 02/17/17 1600 02/17/17 1828 02/17/17 2003  BP: (!) 139/102 (!) 144/104 (!) 155/106 (!) 167/118  Pulse:      Resp: Marland Kitchen)  26  (!) 23 (!) 29  Temp:    98.5 F (36.9 C)  TempSrc:    Oral  SpO2:      Weight:      Height:        Intake/Output Summary (Last 24 hours) at 02/17/17 2143 Last data filed at 02/17/17 1700  Gross per 24 hour  Intake             1260 ml    Output             1350 ml  Net              -90 ml   Filed Weights   02/15/17 0540 02/16/17 0514 02/17/17 0400  Weight: 76.7 kg (169 lb) 77.2 kg (170 lb 1.6 oz) 76.8 kg (169 lb 5 oz)   Examination: Physical Exam:  Constitutional: WN/WD obese AAF in NAD laying in bed Eyes: Sclerae anicteric. Conjunctivae non-injected ENMT: External ears and nose appear normal. Mucuous membranes appear moist Neck: Supple with no appreciable JVD Respiratory: Diminished bibasliar breath sounds. No wheezing or rhonchi. Patient was slightly tachypenic Cardiovascular: RRR; S1, S2. Had puffy ankles Abdomen: Soft, NT, ND. Bowel sounds present GU: Deferred Musculoskeletal: No Contractures; No cyanosis Skin: Warm and Dry. Has a bruise on the medial left thigh. No appreciable rashes or no other bruising noted Neurologic: CN 2-12 grossly intact. No appreciable focal deficits Psychiatric: Pleasant mood and affect. Intact judgement and insight.   Data Reviewed: I have personally reviewed following labs and imaging studies  CBC:  Recent Labs Lab 02/13/17 0827 02/13/17 2119 02/14/17 0604 02/15/17 0259 02/16/17 0243 02/17/17 0536  WBC 8.5  --  9.3 7.9 8.6 6.6  NEUTROABS  --   --   --  6.1 6.7 4.8  HGB 8.1*  --  7.7* 8.4* 8.6* 9.0*  HCT 27.6*  --  27.4* 28.4* 29.5* 31.3*  MCV 73.2*  --  74.3* 74.9* 76.4* 77.9*  PLT 63* 75* 112* 102* 123* 017*   Basic Metabolic Panel:  Recent Labs Lab 02/13/17 0827 02/14/17 0604 02/15/17 0259 02/16/17 0243 02/17/17 0536  NA 134* 132* 131* 135 135  K 2.9* 3.5 2.9* 3.1* 3.5  CL 101 102 102 105 106  CO2 22 20* 19* 19* 19*  GLUCOSE 107* 133* 142* 117* 143*  BUN 32* 38* 39* 40* 40*  CREATININE 2.63* 3.13* 3.64* 3.52* 3.51*  CALCIUM 8.5* 8.7* 8.6* 8.8* 8.9  MG 1.6*  --  2.4 2.1 2.0  PHOS  --  3.1 3.2 4.0 4.1   GFR: Estimated Creatinine Clearance: 19.5 mL/min (A) (by C-G formula based on SCr of 3.51 mg/dL (H)). Liver Function Tests:  Recent Labs Lab  02/12/17 2032 02/13/17 0820 02/14/17 0604 02/15/17 0259 02/16/17 0243 02/17/17 0536  AST 38 30  --  23 24 24   ALT 16 14  --  18 24 28   ALKPHOS 53 48  --  46 53 45  BILITOT 1.3* 1.1  --  0.8 1.0 0.9  PROT 7.3 6.7  --  6.1* 6.4* 6.2*  ALBUMIN 3.6 3.2* 2.9* 3.0* 3.1* 3.0*    Recent Labs Lab 02/12/17 2032  LIPASE 39   No results for input(s): AMMONIA in the last 168 hours. Coagulation Profile:  Recent Labs Lab 02/13/17 2119  INR 1.07   Cardiac Enzymes:  Recent Labs Lab 02/13/17 0827  CKTOTAL 37*  CKMB 4.5  TROPONINI 0.39*   BNP (last 3 results) No results for input(s): PROBNP in the  last 8760 hours. HbA1C: No results for input(s): HGBA1C in the last 72 hours. CBG: No results for input(s): GLUCAP in the last 168 hours. Lipid Profile: No results for input(s): CHOL, HDL, LDLCALC, TRIG, CHOLHDL, LDLDIRECT in the last 72 hours. Thyroid Function Tests: No results for input(s): TSH, T4TOTAL, FREET4, T3FREE, THYROIDAB in the last 72 hours. Anemia Panel:  Recent Labs  02/15/17 0259 02/17/17 0536  RETICCTPCT 5.1* 6.1*   Sepsis Labs:  Recent Labs Lab 02/12/17 2056  LATICACIDVEN 1.13    No results found for this or any previous visit (from the past 240 hour(s)).   Radiology Studies: Dg Chest 2 View  Result Date: 02/17/2017 CLINICAL DATA:  Follow-up.  Shortness of breath EXAM: CHEST  2 VIEW COMPARISON:  Three days ago FINDINGS: Cardiomegaly with interval trace effusions. Mild septal thickening, improved. Stable aortic and hilar contours. IMPRESSION: Cardiomegaly and vascular congestion with new trace effusions. Electronically Signed   By: Monte Fantasia M.D.   On: 02/17/2017 07:21   Scheduled Meds: . carvedilol  25 mg Oral BID WC  . hydrALAZINE  100 mg Oral Q8H  . NIFEdipine  60 mg Oral QHS  . potassium chloride  40 mEq Oral BID   Continuous Infusions: . cefTRIAXone (ROCEPHIN)  IV Stopped (02/17/17 1906)  . furosemide Stopped (02/17/17 1435)    LOS: 5  days   Kerney Elbe, DO [facility] Pager [phone]  If 7PM-7AM, please contact night-coverage [email or web address] Password [facility] 02/17/2017, 9:43 PM

## 2017-02-17 NOTE — Progress Notes (Signed)
Assessment/ Plan:   1.Hypertensive emergency:Need better control, has vol overload-Add furosemide IV again 2. Acute kidney injury: BP control and TMA 3. Anemia/thrombocytopenia c/w thrombotic microangiopathy 4.Hypokalemia:aldo related 5.Hyponatremia: imnproved  Subjective: Interval History: SOB last PM, orthopnea, DOE  Objective: Vital signs in last 24 hours: Temp:  [98.6 F (37 C)-99 F (37.2 C)] 99 F (37.2 C) (09/04 0400) Pulse Rate:  [99-104] 103 (09/04 0607) Resp:  [18-24] 20 (09/04 0607) BP: (147-167)/(98-112) 167/109 (09/04 0607) SpO2:  [96 %-98 %] 98 % (09/04 0607) Weight:  [76.8 kg (169 lb 5 oz)] 76.8 kg (169 lb 5 oz) (09/04 0400) Weight change: -0.357 kg (-12.6 oz)  Intake/Output from previous day: 09/03 0701 - 09/04 0700 In: 1060 [P.O.:960; IV Piggyback:100] Out: -  Intake/Output this shift: No intake/output data recorded.  General appearance: alert and cooperative Resp: clear to auscultation bilaterally Chest wall: no tenderness Cardio: regular rate and rhythm, S1, S2 normal, no murmur, click, rub or gallop Extremities: edema tr  Lab Results:  Recent Labs  02/16/17 0243 02/17/17 0536  WBC 8.6 6.6  HGB 8.6* 9.0*  HCT 29.5* 31.3*  PLT 123* 140*   BMET:  Recent Labs  02/16/17 0243 02/17/17 0536  NA 135 135  K 3.1* 3.5  CL 105 106  CO2 19* 19*  GLUCOSE 117* 143*  BUN 40* 40*  CREATININE 3.52* 3.51*  CALCIUM 8.8* 8.9   No results for input(s): PTH in the last 72 hours. Iron Studies: No results for input(s): IRON, TIBC, TRANSFERRIN, FERRITIN in the last 72 hours. Studies/Results: Dg Chest 2 View  Result Date: 02/17/2017 CLINICAL DATA:  Follow-up.  Shortness of breath EXAM: CHEST  2 VIEW COMPARISON:  Three days ago FINDINGS: Cardiomegaly with interval trace effusions. Mild septal thickening, improved. Stable aortic and hilar contours. IMPRESSION: Cardiomegaly and vascular congestion with new trace effusions. Electronically Signed   By:  Monte Fantasia M.D.   On: 02/17/2017 07:21    Scheduled: . carvedilol  25 mg Oral BID WC  . hydrALAZINE  100 mg Oral Q8H  . NIFEdipine  60 mg Oral QHS  . potassium chloride  40 mEq Oral BID      LOS: 5 days   Eternity Dexter C 02/17/2017,10:15 AM

## 2017-02-18 LAB — CBC WITH DIFFERENTIAL/PLATELET
BASOS PCT: 0 %
Basophils Absolute: 0 10*3/uL (ref 0.0–0.1)
EOS ABS: 0.1 10*3/uL (ref 0.0–0.7)
EOS PCT: 2 %
HCT: 33.3 % — ABNORMAL LOW (ref 36.0–46.0)
Hemoglobin: 9.9 g/dL — ABNORMAL LOW (ref 12.0–15.0)
LYMPHS ABS: 1.2 10*3/uL (ref 0.7–4.0)
Lymphocytes Relative: 19 %
MCH: 23.5 pg — AB (ref 26.0–34.0)
MCHC: 29.7 g/dL — ABNORMAL LOW (ref 30.0–36.0)
MCV: 78.9 fL (ref 78.0–100.0)
MONO ABS: 0.4 10*3/uL (ref 0.1–1.0)
Monocytes Relative: 6 %
Neutro Abs: 4.7 10*3/uL (ref 1.7–7.7)
Neutrophils Relative %: 73 %
PLATELETS: 156 10*3/uL (ref 150–400)
RBC: 4.22 MIL/uL (ref 3.87–5.11)
RDW: 24.3 % — AB (ref 11.5–15.5)
WBC: 6.4 10*3/uL (ref 4.0–10.5)

## 2017-02-18 LAB — COMPREHENSIVE METABOLIC PANEL
ALT: 23 U/L (ref 14–54)
AST: 20 U/L (ref 15–41)
Albumin: 3.2 g/dL — ABNORMAL LOW (ref 3.5–5.0)
Alkaline Phosphatase: 48 U/L (ref 38–126)
Anion gap: 13 (ref 5–15)
BUN: 41 mg/dL — ABNORMAL HIGH (ref 6–20)
CHLORIDE: 102 mmol/L (ref 101–111)
CO2: 21 mmol/L — ABNORMAL LOW (ref 22–32)
Calcium: 9 mg/dL (ref 8.9–10.3)
Creatinine, Ser: 3.81 mg/dL — ABNORMAL HIGH (ref 0.44–1.00)
GFR, EST AFRICAN AMERICAN: 16 mL/min — AB (ref >60)
GFR, EST NON AFRICAN AMERICAN: 14 mL/min — AB (ref >60)
Glucose, Bld: 127 mg/dL — ABNORMAL HIGH (ref 65–99)
POTASSIUM: 3.5 mmol/L (ref 3.5–5.1)
Sodium: 136 mmol/L (ref 135–145)
Total Bilirubin: 0.6 mg/dL (ref 0.3–1.2)
Total Protein: 6.4 g/dL — ABNORMAL LOW (ref 6.5–8.1)

## 2017-02-18 LAB — ALDOSTERONE + RENIN ACTIVITY W/ RATIO
ALDO / PRA RATIO: 1.7 (ref 0.0–30.0)
Aldosterone: 16.4 ng/dL (ref 0.0–30.0)
PRA LC/MS/MS: 9.404 ng/mL/h — AB (ref 0.167–5.380)

## 2017-02-18 LAB — PHOSPHORUS: PHOSPHORUS: 4.9 mg/dL — AB (ref 2.5–4.6)

## 2017-02-18 LAB — MAGNESIUM: MAGNESIUM: 1.9 mg/dL (ref 1.7–2.4)

## 2017-02-18 LAB — PATHOLOGIST SMEAR REVIEW

## 2017-02-18 MED ORDER — FUROSEMIDE 80 MG PO TABS
80.0000 mg | ORAL_TABLET | Freq: Every day | ORAL | Status: DC
  Administered 2017-02-18 – 2017-02-19 (×2): 80 mg via ORAL
  Filled 2017-02-18 (×2): qty 1

## 2017-02-18 MED ORDER — CLONIDINE HCL 0.2 MG PO TABS
0.2000 mg | ORAL_TABLET | Freq: Three times a day (TID) | ORAL | Status: DC
  Administered 2017-02-18 – 2017-02-19 (×4): 0.2 mg via ORAL
  Filled 2017-02-18 (×4): qty 1

## 2017-02-18 MED ORDER — NIFEDIPINE ER OSMOTIC RELEASE 30 MG PO TB24
90.0000 mg | ORAL_TABLET | Freq: Every day | ORAL | Status: DC
  Administered 2017-02-18: 90 mg via ORAL
  Filled 2017-02-18: qty 3

## 2017-02-18 NOTE — Progress Notes (Signed)
Assumed care from going RN @ 0715; SR on tele  MD rounded on patient; discussed BP concerns and MD will contact cardiology about SB occurrence on yesterday; see epic for BP medications changes  @0928  am patient alert & oriented; patient sitting on side of bed; patient concerns addressed;

## 2017-02-18 NOTE — Progress Notes (Signed)
PROGRESS NOTE    Madeline Brown  TDV:[id number] DOB: May 30, 1976 DOA: 02/12/2017 PCP: Patient, No Pcp Per   Brief Narrative:  Madeline Brown 41 y.o.female,with uncontrolled hypertension presented due to epigastric pain for the past 2 days with Slight nausea. In the ER, pt was found to be in ARF, Hypokalemic, Anemic, and with Madeline Brown Trop 0.38, and Hypertensive Emergency. CT Abd/Pelvis showed Gallbladder sludge and possible cholecystitis, Madeline Brown Nonobstructing 4 mm RIGHT nephrolithiasis. 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 She was transferred to [facility] Brown for [facility]k Nephrology evaluation and Hematology evaluation and found to have Madeline Brown Hemolytic Anemia. Workup currently in progress and Cardiology, Nephrology, and Hematology consulted to evaluate the Patient. Patient's BP has improved but Kidney Function has worsened. Patient was transfused 1 unit of pRBC's today given her SOB and drop in Hb/Hct. CXR was reviewed and it showed Mild CHF and/or fluid overload, with development of mild interstitial pulmonary edema since the examinations 2 days ago so patient was given IV Lasix 20 mg x1 on 02/15/17. Nephrology evaluated and gave IV Lasix 100 mg for Volume Overload on 02/16/17 and today Nephrology gave 100 mg of Lasix q12h.   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 [ ]  transfuse for >8  [ ]  consider repeat IV feraheme (last dose 9/1) -Smear showed Schistocytes; T Bili was elevated -Haptoglobin was <10, LDH was 682, D-Dimer was 4.40, Fibrinogen was 498, PT was  13.8, INR was 1.07, APTT was 29 -Coombs Test was Negative -Anemia Panel showed Iron level of 33, UIBC 313, TIBC of 346, Saturation Ratios of 10, Ferritin of 14, Vitamin B12 of 2634 -Hb/Hct went from 8.1/27.6 -> 7.7/27.4 -> 8.4/28.4 -> 8.6/29.5 -> 9.9/33.3 -S/p Transfusion of 1 unit of pRBC's  -Nephrology ordering Feraheme -Hematology doesn't feel like this is Autoimmune hemolysis so they do not see any benefits of Steroids -DIC not evident given elevated Fibrinogen  -Hematology recommending Supportive Care and to Transfuse to Maintain Hb > 8.0 -Continue to Monitor and Repeat CBC with Diff and Retic Count in AM -Appreciated Hematology Input and Reccs. Patient will need to follow up with Dr. Burr Medico as an outpatient.   Thrombocytopenia -Platelet Count improved to 156 -Hematology feel TTP an HUS are low possibilities given improving Platelet Count -[id number] Activity was 79.2 and Negative r/o TTP -Continue to Monitor and Repeat CBC in AM -Appreciated Hematology following and appreciate Recc's  AKI/Acute Renal Failure -Worsening. BUN/Cr went from 32/2.63 -> 38/3.13 -> 39/3.64 -> 40/3.52 -> 41/3.81 today -Nephrology Following and appreciate additional Recc's -Nephrology feels like it was most likely hemodynamically mediated with with HTN Emergency and suspect BP control would be associated with slight rise in Cr prior to Eventual Improvement  -Renal U/S Negative for Obstruction and Urine Lytes sent by Nephrology -Nephrology Discontinued IVF; diuretics per nephrology (now 80 mg PO daily - decreased) -Continue to Monitor and Repeat CMP in AM; Nephrology to decide on further intervention  Hypertensive Emergency, Improved -Patient now off Nicardipine gtt -C/w Carvedilol 25 mg po BID, Hydralazine 100 mg po q8h and Nifedipine 60 mg po Daily qHS (will plan  to increase to 90 mg tonight).  Clonidine added by nephrology. -IV Lasix 20 mg given 1x on 02/15/17; Nephrology to decide on need for scheduled  oral Furosemide pending Urine Osmolality result and ordered IV Lasix 100 mg of Lasix  -C/w IV Hydralazine 10 mg q6hprn for SBP >160 [ ]  renin/aldo pending  Elevated Troponin -Troponin was elevated at 0.44, 0.38, and 0.39 -Cardiology evaluated and feel likley ischemia due to medical illness/ renal failure/ hypertensive emergency -ECHO done and shows normal EF, mild MR, trivial pericardial effusion (unable to eval diastolic dysfunction) -Cardiology recommending conservative approach and considering Outpatient Myoview at D/C -Cardiology Signing off  Bradycardia: Tele labeled as 2nd degree type 2, but PR appears to be lengthening which would be 2nd degree type 1.  Discussed with Dr. Johnsie Cancel who was in agreement.  Continue to monitor, likely no need for further f/u.    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 -Serum Osmolality was 289 and Urine Osm was 415  -Repeat CMP in AM  Hypokalemia -improved -Continue to Replete with Potassium Chloride 40 mEQ po BID and Nephrology recommending continuing with supplementation  -Nephrology following and feels that is from Uncontrolled HTN/Kaliuresis [ ]  Plasma Renin/Aldosterone Ratio pending  -Repeat CMP in AM   Shortness of Breath likely from Volume Overload and low Hb -Patient saturating well on Room Air -Likely feeling SOB from low Hb and Volume Overload -CXR 02/14/17 showed Mild CHF and/or fluid overload, with development of mild interstitial pulmonary edema since the examinations 2 days ago. -Transfused 1 unit of pRBC's 02/14/17 -IVF D/C'd  - diuretics per nephrology as above -Repeat CXR in AM (9/4 --- cardiomegaly, vascular congestion, and trace new effusions)  Abdominal Pain/Right Upper Quadrant Pain, improved  -CT Abd/Pelvis showed Gallbladder sludge and possible cholecystitis though, ultrasound would be more sensitive.   Nonobstructing 4 mm RIGHT nephrolithiasis. 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 -U/S of Gallbladder shows Gallbladder sludge. No visible calculi. Normal bile ducts. Negative sonographic Murphy sign. Coarsened hepatic parenchymal echotexture could represent fatty infiltration or hepatocellular disease. -Lipase was Normal -HIDA Scan done and showed Negative for acute cholecystitis. Patent cystic duct. Normal excretion/ transit of radiotracer into the bowel. Decreased gallbladder ejection fraction of 22% suggesting dyskinesia -Abdominal Pain Improved [ ]  Will discuss with General Surgery about HIDA findings   Fatty infiltration of liver/Hepatocellular diseases. -Had Mildly increase Bilirubin but now is 1.0 -Recommended, low fat diet, weight lost. Advised to stop drinking alcohol.  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 -Patient aware. Needs to follow with Gynecology as an outpatient   ? UTI -Urinalysis showed Trace Leukocytes, TNTC RBC, TNTC WBC, and No Bacteria -Was started on Empiric Ceftriaxone 8/31-9/5. - continue to monitor    DVT prophylaxis: SCD Code Status: Full Family Communication: discussed with husband on phone Disposition Plan: pending improvement in renal function   Consultants:   Nephrology  Cardiology  Heme  Procedures: (Don't include imaging studies which can be auto populated. Include things that cannot be auto populated i.e. Echo, Carotid and venous dopplers, Foley, Bipap, HD, tubes/drains, wound vac, central lines etc)  Echo with EF 50-55%, trivial pericardial effusion, mild MV regurg  Renal US with no obstructive uropathy - increased bilateral renal cortical echogenicity as can be seen with medical renal disease or acute kidney disease  HIDA - negative for acute cholecystitis, patent cystic duct.  Decreased gallblader EF  suggesting dyskinesia.   RUQ [place] with gallbladder sludge, no  visible calculi, normal bile ducts.  Coarsened parenchymal echotexture, fatty infiltration vs hepatocellular disease  Antimicrobials: (specify start and planned stop date. Auto populated tables are space occupying and do not give end dates)  ceftriaxone    Subjective: No SOB.  Abd pain resolved.  No fevers, chills.  Asking when she can go home.   Objective: Vitals:   02/17/17 2003 02/18/17 0015 02/18/17 0445 02/18/17 0513  BP: (!) 167/118 (!) 158/108 (!) 167/111 (!) 170/107  Pulse:      Resp: (!) 29  (!) 22   Temp: 98.5 F (36.9 C)  98.9 F (37.2 C)   TempSrc: Oral  Oral   SpO2:      Weight:   74.1 kg (163 lb 6.4 oz)   Height:        Intake/Output Summary (Last 24 hours) at 02/18/17 0802 Last data filed at 02/17/17 1700  Gross per 24 hour  Intake              660 ml  Output             1350 ml  Net             -690 ml   Filed Weights   02/16/17 0514 02/17/17 0400 02/18/17 0445  Weight: 77.2 kg (170 lb 1.6 oz) 76.8 kg (169 lb 5 oz) 74.1 kg (163 lb 6.4 oz)    Examination:  General exam: Appears calm and comfortable  Respiratory system: Clear to auscultation. Respiratory effort normal. Cardiovascular system: S1 & S2 heard, RRR. No JVD, murmurs, rubs, gallops or clicks. No pedal edema. Gastrointestinal system: Abdomen is nondistended, soft and nontender. No organomegaly or masses felt. Normal bowel sounds heard. Central nervous system: Alert and oriented. No focal neurological deficits. Extremities: Symmetric 5 x 5 power. Skin: No rashes, lesions or ulcers Psychiatry: Judgement and insight appear normal. Mood & affect appropriate.     Data Reviewed: I have personally reviewed following labs and imaging studies  CBC:  Recent Labs Lab 02/14/17 0604 02/15/17 0259 02/16/17 0243 02/17/17 0536 02/18/17 0457  WBC 9.3 7.9 8.6 6.6 6.4  NEUTROABS  --  6.1 6.7 4.8 4.7  HGB 7.7* 8.4* 8.6* 9.0* 9.9*  HCT 27.4* 28.4* 29.5* 31.3* 33.3*  MCV 74.3* 74.9* 76.4* 77.9* 78.9    PLT 112* 102* 123* 140* 841   Basic Metabolic Panel:  Recent Labs Lab 02/13/17 0827 02/14/17 0604 02/15/17 0259 02/16/17 0243 02/17/17 0536 02/18/17 0457  NA 134* 132* 131* 135 135 136  K 2.9* 3.5 2.9* 3.1* 3.5 3.5  CL 101 102 102 105 106 102  CO2 22 20* 19* 19* 19* 21*  GLUCOSE 107* 133* 142* 117* 143* 127*  BUN 32* 38* 39* 40* 40* 41*  CREATININE 2.63* 3.13* 3.64* 3.52* 3.51* 3.81*  CALCIUM 8.5* 8.7* 8.6* 8.8* 8.9 9.0  MG 1.6*  --  2.4 2.1 2.0 1.9  PHOS  --  3.1 3.2 4.0 4.1 4.9*   GFR: Estimated Creatinine Clearance: 17.6 mL/min (Nimsi Males) (by C-G formula based on SCr of 3.81 mg/dL (H)). Liver Function Tests:  Recent Labs Lab 02/13/17 0820 02/14/17 0604 02/15/17 0259 02/16/17 0243 02/17/17 0536 02/18/17 0457  AST 30  --  23 24 24 20   ALT 14  --  18 24 28 23   ALKPHOS 48  --  46 53 45 48  BILITOT 1.1  --  0.8 1.0 0.9 0.6  PROT 6.7  --  6.1* 6.4* 6.2* 6.4*  ALBUMIN 3.2* 2.9* 3.0* 3.1* 3.0* 3.2*    Recent Labs Lab 02/12/17 2032  LIPASE 39   No results for input(s): AMMONIA in the last 168 hours. Coagulation Profile:  Recent Labs Lab 02/13/17 2119  INR 1.07   Cardiac Enzymes:  Recent Labs Lab 02/13/17 0827  CKTOTAL 37*  CKMB 4.5  TROPONINI 0.39*   BNP (last 3 results) No results for input(s): PROBNP in the last 8760 hours. HbA1C: No results for input(s): HGBA1C in the last 72 hours. CBG: No results for input(s): GLUCAP in the last 168 hours. Lipid Profile: No results for input(s): CHOL, HDL, LDLCALC, TRIG, CHOLHDL, LDLDIRECT in the last 72 hours. Thyroid Function Tests: No results for input(s): TSH, T4TOTAL, FREET4, T3FREE, THYROIDAB in the last 72 hours. Anemia Panel:  Recent Labs  02/17/17 0536  RETICCTPCT 6.1*   Sepsis Labs:  Recent Labs Lab 02/12/17 2056  LATICACIDVEN 1.13    No results found for this or any previous visit (from the past 240 hour(s)).       Radiology Studies: Dg Chest 2 View  Result Date:  02/17/2017 CLINICAL DATA:  Follow-up.  Shortness of breath EXAM: CHEST  2 VIEW COMPARISON:  Three days ago FINDINGS: Cardiomegaly with interval trace effusions. Mild septal thickening, improved. Stable aortic and hilar contours. IMPRESSION: Cardiomegaly and vascular congestion with new trace effusions. Electronically Signed   By: Monte Fantasia M.D.   On: 02/17/2017 07:21        Scheduled Meds: . carvedilol  25 mg Oral BID WC  . hydrALAZINE  100 mg Oral Q8H  . NIFEdipine  60 mg Oral QHS  . potassium chloride  40 mEq Oral BID   Continuous Infusions: . cefTRIAXone (ROCEPHIN)  IV Stopped (02/17/17 1906)  . furosemide Stopped (02/17/17 2329)     LOS: 6 days    Time spent: 35 min    Jamaree Hosier Melven Sartorius, MD [facility] [phone]   If 7PM-7AM, please contact night-coverage [email or web address] Password [email or web address] 02/18/2017, 8:02 AM

## 2017-02-18 NOTE — Progress Notes (Signed)
Assessment/ Plan:   1.Hypertensive emergency:Need better control, add clonidine and PO furosemide 2. Acute kidney injury: BP control and TMA 3. Anemia/thrombocytopenia c/wthrombotic microangiopathy 4.Hypokalemia:aldo related---if remains problematic w/u as OP(normal adrenals on CT) 5.Hyponatremia: improved 6. Tachycardic ? Med related or anemia-clonidine may help  Subjective: Interval History: Excellent UOP per her report, weight falling, breathing better  Objective: Vital signs in last 24 hours: Temp:  [98.5 F (36.9 C)-99.3 F (37.4 C)] 98.9 F (37.2 C) (09/05 0445) Pulse Rate:  [95] 95 (09/04 1242) Resp:  [21-29] 22 (09/05 0445) BP: (139-170)/(102-118) 170/107 (09/05 0513) SpO2:  [92 %-98 %] 92 % (09/04 1242) Weight:  [74.1 kg (163 lb 6.4 oz)] 74.1 kg (163 lb 6.4 oz) (09/05 0445) Weight change: -2.682 kg (-5 lb 14.6 oz)  Intake/Output from previous day: 09/04 0701 - 09/05 0700 In: 1020 [P.O.:960; IV Piggyback:60] Out: 1350 [[id number]] Intake/Output this shift: No intake/output data recorded.  General appearance: alert and cooperative Resp: clear to auscultation bilaterally Chest wall: no tenderness Extremities: edema tr edema  Lab Results:  Recent Labs  02/17/17 0536 02/18/17 0457  WBC 6.6 6.4  HGB 9.0* 9.9*  HCT 31.3* 33.3*  PLT 140* 156   BMET:  Recent Labs  02/17/17 0536 02/18/17 0457  NA 135 136  K 3.5 3.5  CL 106 102  CO2 19* 21*  GLUCOSE 143* 127*  BUN 40* 41*  CREATININE 3.51* 3.81*  CALCIUM 8.9 9.0   No results for input(s): PTH in the last 72 hours. Iron Studies: No results for input(s): IRON, TIBC, TRANSFERRIN, FERRITIN in the last 72 hours. Studies/Results: Dg Chest 2 View  Result Date: 02/17/2017 CLINICAL DATA:  Follow-up.  Shortness of breath EXAM: CHEST  2 VIEW COMPARISON:  Three days ago FINDINGS: Cardiomegaly with interval trace effusions. Mild septal thickening, improved. Stable aortic and hilar contours. IMPRESSION:  Cardiomegaly and vascular congestion with new trace effusions. Electronically Signed   By: Monte Fantasia M.D.   On: 02/17/2017 07:21    Scheduled: . carvedilol  25 mg Oral BID WC  . hydrALAZINE  100 mg Oral Q8H  . NIFEdipine  90 mg Oral QHS  . potassium chloride  40 mEq Oral BID      LOS: 6 days   Midori Dado C 02/18/2017,8:57 AM

## 2017-02-18 NOTE — Care Management Note (Signed)
Case Management Note Marvetta Gibbons RN, BSN Unit 4E-Case Manager [phone]  Patient Details  Name: Madeline Brown MRN: [id number] Date of Birth: 01/31/1976  Subjective/Objective:   Pt admitted with HTN emergency and AKI                 Action/Plan: PTA pt lived at home with spouse- anticipate return home- referral for PCP needs received- spoke with pt at bedside regarding PCP- per pt she does not have a primary care MD- has family planning Medicaid- pt open to one of the clinics- will call [place] to see about f/u appointment. Also discussed possible medication assistance- explained to pt that if meds where on generic $4 list then assistance would not be needed- pt states she would be able to afford $4 meds- however if pt is on more expensive meds- CM will assess need for assistance with MATCH once d/c medications known.   Expected Discharge Date:   (unknown)               Expected Discharge Plan:  Home/Self Care  In-House Referral:     Discharge planning Services  CM Consult, Medication Assistance, [facility], Follow-up appt scheduled  Post Acute Care Choice:    Choice offered to:     DME Arranged:    DME Agency:     HH Arranged:    HH Agency:     Status of Service:  In process, will continue to follow  If discussed at Long Length of Stay Meetings, dates discussed:    Discharge Disposition: home/self care   Additional Comments:  02/18/17- 1130- Marvetta Gibbons RN, CM- call made to [facility] f/u appointment secured for pt on Sept. 12 at 1030- info given to pt on clinic- along with that pt can use [facility] pharmacy at discharge if needed for medications. CM will continue to follow for potential medication needs.   Dawayne Patricia, RN 02/18/2017, 12:02 PM

## 2017-02-19 ENCOUNTER — Encounter: Payer: Self-pay | Admitting: Family Medicine

## 2017-02-19 LAB — COMPREHENSIVE METABOLIC PANEL
ALBUMIN: 2.8 g/dL — AB (ref 3.5–5.0)
ALK PHOS: 39 U/L (ref 38–126)
ALT: 19 U/L (ref 14–54)
ANION GAP: 9 (ref 5–15)
AST: 18 U/L (ref 15–41)
BILIRUBIN TOTAL: 0.6 mg/dL (ref 0.3–1.2)
BUN: 46 mg/dL — AB (ref 6–20)
CO2: 22 mmol/L (ref 22–32)
Calcium: 8.8 mg/dL — ABNORMAL LOW (ref 8.9–10.3)
Chloride: 102 mmol/L (ref 101–111)
Creatinine, Ser: 3.89 mg/dL — ABNORMAL HIGH (ref 0.44–1.00)
GFR calc Af Amer: 16 mL/min — ABNORMAL LOW (ref >60)
GFR calc non Af Amer: 13 mL/min — ABNORMAL LOW (ref >60)
GLUCOSE: 149 mg/dL — AB (ref 65–99)
Potassium: 4.4 mmol/L (ref 3.5–5.1)
Sodium: 133 mmol/L — ABNORMAL LOW (ref 135–145)
TOTAL PROTEIN: 5.6 g/dL — AB (ref 6.5–8.1)

## 2017-02-19 LAB — CBC WITH DIFFERENTIAL/PLATELET
BASOS ABS: 0.1 10*3/uL (ref 0.0–0.1)
Basophils Relative: 1 %
EOS ABS: 0.1 10*3/uL (ref 0.0–0.7)
Eosinophils Relative: 2 %
HCT: 30.9 % — ABNORMAL LOW (ref 36.0–46.0)
Hemoglobin: 8.9 g/dL — ABNORMAL LOW (ref 12.0–15.0)
LYMPHS ABS: 1.4 10*3/uL (ref 0.7–4.0)
LYMPHS PCT: 22 %
MCH: 23 pg — ABNORMAL LOW (ref 26.0–34.0)
MCHC: 28.8 g/dL — AB (ref 30.0–36.0)
MCV: 79.8 fL (ref 78.0–100.0)
Monocytes Absolute: 0.3 10*3/uL (ref 0.1–1.0)
Monocytes Relative: 5 %
NEUTROS ABS: 4.5 10*3/uL (ref 1.7–7.7)
Neutrophils Relative %: 70 %
Platelets: 161 10*3/uL (ref 150–400)
RBC: 3.87 MIL/uL (ref 3.87–5.11)
RDW: 24.8 % — AB (ref 11.5–15.5)
WBC: 6.4 10*3/uL (ref 4.0–10.5)

## 2017-02-19 MED ORDER — DOCUSATE SODIUM 100 MG PO CAPS: 100.0000 mg | ORAL_CAPSULE | Freq: Two times a day (BID) | ORAL | 0 refills | Status: DC | PRN

## 2017-02-19 MED ORDER — CARVEDILOL 25 MG PO TABS: 25.0000 mg | ORAL_TABLET | Freq: Two times a day (BID) | ORAL | 0 refills | Status: DC

## 2017-02-19 MED ORDER — POTASSIUM CHLORIDE CRYS ER 20 MEQ PO TBCR: 40.0000 meq | EXTENDED_RELEASE_TABLET | Freq: Every day | ORAL | 0 refills | Status: DC

## 2017-02-19 MED ORDER — FUROSEMIDE 80 MG PO TABS: 80.0000 mg | ORAL_TABLET | Freq: Every day | ORAL | 0 refills | Status: DC

## 2017-02-19 MED ORDER — POTASSIUM CHLORIDE CRYS ER 20 MEQ PO TBCR: 40.0000 meq | EXTENDED_RELEASE_TABLET | Freq: Every day | ORAL | Status: DC

## 2017-02-19 MED ORDER — CLONIDINE HCL 0.2 MG PO TABS: 0.2000 mg | ORAL_TABLET | Freq: Three times a day (TID) | ORAL | 0 refills | Status: DC

## 2017-02-19 MED ORDER — HYDRALAZINE HCL 100 MG PO TABS
100.0000 mg | ORAL_TABLET | Freq: Three times a day (TID) | ORAL | 0 refills | Status: DC
Start: 2017-02-19 — End: 2017-02-19

## 2017-02-19 MED ORDER — HYDRALAZINE HCL 100 MG PO TABS: 100.0000 mg | ORAL_TABLET | Freq: Three times a day (TID) | ORAL | 0 refills | Status: DC

## 2017-02-19 MED ORDER — CARVEDILOL 25 MG PO TABS
25.0000 mg | ORAL_TABLET | Freq: Two times a day (BID) | ORAL | 0 refills | Status: DC
Start: 2017-02-19 — End: 2017-02-19

## 2017-02-19 MED ORDER — NIFEDIPINE ER OSMOTIC RELEASE 90 MG PO TB24: 90.0000 mg | ORAL_TABLET | Freq: Every day | ORAL | 0 refills | Status: DC

## 2017-02-19 MED ORDER — FERROUS SULFATE 325 (65 FE) MG PO TABS: 325.0000 mg | ORAL_TABLET | Freq: Two times a day (BID) | ORAL | 0 refills | Status: DC

## 2017-02-19 MED ORDER — DOCUSATE SODIUM 100 MG PO CAPS: 100.0000 mg | ORAL_CAPSULE | Freq: Two times a day (BID) | ORAL | 0 refills | Status: AC | PRN

## 2017-02-19 MED ORDER — FUROSEMIDE 80 MG PO TABS
80.0000 mg | ORAL_TABLET | Freq: Every day | ORAL | 0 refills | Status: DC
Start: 2017-02-20 — End: 2017-02-19

## 2017-02-19 NOTE — Progress Notes (Signed)
Madeline Brown to be D/C'd Home per MD order. Discussed with the patient and all questions fully answered.    VVS, Skin clean, dry and intact without evidence of skin break down, no evidence of skin tears noted.  IV catheter discontinued intact. Site without signs and symptoms of complications. Dressing and pressure applied.  An After Visit Summary was printed and given to the patient.  Patient escorted via [place], and D/C home via private auto.  [name]  02/19/2017 4:58 PM

## 2017-02-19 NOTE — Discharge Summary (Signed)
Physician Discharge Summary  Tariyah Pendry TIW:[id number] DOB: 01/14/1976 DOA: 02/12/2017  PCP: Patient, No Pcp Per  Admit date: 02/12/2017 Discharge date: 02/19/2017  Time spent: greater than 30 minutes  Recommendations for Outpatient Follow-up:  1. Follow up outpatient CBC and BMP for anemia and renal function/K  2. Elevated PRA (9.4), but normal Aldo/PRA ratio 3. Follow up with renal and heme 4. Follow up abdominal discomfort symptomatically (normal hida, only biliary dyskinesia, follow up prn) 5. Consider outpatient stress test given elevated troponin during admission 6. Recommended discussing birth control with PCP given recent medical complications as well as several antihypertensives which are not recommended in pregnancy (discussed condoms or abstinence recommended until follow up) 7. F/u likely fibroid uterus seen on imaging   Discharge Diagnoses:  Principal Problem:   ARF (acute renal failure) (HCC) Active Problems:   Anemia   Hypokalemia   Elevated troponin   Hyponatremia   Tachycardia   Thrombocytopenia (HCC)   MAHA (microangiopathic hemolytic anemia) (HCC)   Abdominal pain   Discharge Condition: stable  Diet recommendation: heart healthy  Filed Weights   02/17/17 0400 02/18/17 0445 02/19/17 0441  Weight: 76.8 kg (169 lb 5 oz) 74.1 kg (163 lb 6.4 oz) 75.9 kg (167 lb 5.3 oz)    History of present illness:  [name] Oliva Montecalvo 41 y.o.female,with uncontrolledhypertension presenteddue to epigastric pain for the past 2 days with Slight nausea. In the ER, pt was found to be in ARF, Hypokalemic, Anemic, and with Tayna Sm[place]st Trop 0.38, and Hypertensive Emergency. CT Abd/Pelvis showed Gallbladder sludge and possible cholecystitis, Tajh Livsey Nonobstructing 4 mm RIGHT nephrolithiasis. 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 She was transferred to [name] evaluation and Hematology evaluation and found to have Vanesa Renier Hemolytic Anemia. Workup currently in  progress and Cardiology, Nephrology, and Hematology consulted to evaluate the Patient. Patient's BP has improved but Kidney Function has worsened. Patient was transfused 1 unit of pRBC's today given her SOB and drop in Hb/Hct. CXR was reviewed and it showed Mild CHF and/or fluid overload, with development of mild interstitial pulmonary edema since the examinations 2 days ago so patient was given IV Lasix 20 mg x1 on 02/15/17. Nephrology evaluated and gave IV Lasix 100 mg for Volume Overload on 02/16/17 and today Nephrology gave 100 mg of Lasix q12h.   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  Discharged on iron. -Hematology recommending Supportive Care and to Transfuse to Maintain Hb > 8.0 Seen by heme inpatient who felt hypertension emergency related TMA, H/H stable today at d/c, plan to d/c with outpatient heme follow up.    Thrombocytopenia -Platelet Count improved at discharge to normal range -Hematology feel TTP an HUS are low possibilities given improving Platelet Count -ADAMTS13 Activity was 79.2 and Negative r/o TTP -follow up outpatient CBC  AKI/Acute Renal Failure -Stabilized around 3.8 at discharge.   -Nephrology Following and appreciate additional Recc's -Nephrology  feels like it was most likely hemodynamically mediated with with HTN Emergency and suspect BP control would  be associated with slight rise in Cr prior to Eventual Improvement  -Renal U/S Negative for Obstruction and Urine Lytes sent by Nephrology -Nephrology Discontinued IVF; diuretics per nephrology (now 80 mg PO daily - decreased) Discharged with outpatient follow up with Dr. Posey Pronto on 9/24 at [facility].   Hypertensive Emergency - s/p Nicardipine gtt - elevated PRA, but normal aldo/PRA ratio - discharged on Carvedilol 25 mg po BID, Hydralazine 100 mg po q8h, Nifedipine 90 mg po Daily qHS, clonidine, and lasix 80 mg daily with improved BP's  Elevated Troponin -Troponin was elevated at 0.44, 0.38, and 0.39 -Cardiology evaluated and feel likley ischemia due to medical illness/ renal failure/ hypertensive emergency -ECHO done and shows normal EF, mild MR, trivial pericardial effusion (unable to eval diastolic dysfunction) -Cardiology recommending conservative approach and considering Outpatient Myoview at D/C  Bradycardia: Tele labeled as 2nd degree type 2, but PR appears to be lengthening which would be 2nd degree type 1.  Discussed with Dr. Johnsie Cancel who was in agreement.  Continue to monitor, likely no need for further f/u.    Mild Hyponatremia, improved after Diuresis  Hypokalemia - improved - renin/aldosterone notable for normal Aldo/PRA ratio (notably elevated PRA) -Continue to Replete with Potassium Chloride 40 mEQ po BID and Nephrology recommending continuing with supplementation  -Nephrology following and feels that is from Uncontrolled HTN/Kaliuresis Improved at discharge, discharged on 40 mg K daily  Shortness of Breath likely from Volume Overload and low Hb - improved at discharge  Abdominal Pain/Right Upper Quadrant Pain, improved  -CT Abd/Pelvis showed Gallbladder sludge and possible cholecystitis though, ultrasound would be more sensitive. Nonobstructing 4 mm RIGHT nephrolithiasis. 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 -U/S of Gallbladder shows Gallbladder  sludge. No visible calculi. Normal bile ducts. Negative sonographic Murphy sign. Coarsened hepatic parenchymal echotexture could represent fatty infiltration or hepatocellular disease. -Lipase was Normal -HIDA Scan done and showed Negative for acute cholecystitis. Patent cystic duct. Normal excretion/ transit of radiotracer into the bowel. Decreased gallbladder ejection fraction of 22% suggesting dyskinesia -Abdominal Pain Improved [ ]  Will discuss with General Surgery about HIDA findings -> rec follow up prn  Fatty infiltration of liver/Hepatocellular diseases. -Had Mildly increase Bilirubin but now is 1.0 -Recommended, low fat diet, weight lost. Advised to stop drinking alcohol.  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 -Patient aware. Needs to follow with Gynecology as an outpatient   ? UTI -Urinalysis showed Trace Leukocytes, TNTC RBC, TNTC WBC, and No Bacteria -Was started on Empiric Ceftriaxone 8/31-9/5. - continue to monitor   Procedures: Echo (EF 50-55%, mild MV regurgitation, trivial pericardial effusion)   Renal US with no obstructive uropathy - increased bilateral renal cortical echogenicity as can be seen with medical renal disease or acute kidney disease  HIDA - negative for acute cholecystitis, patent cystic duct.  Decreased gallblader EF suggesting dyskinesia.   RUQ [place] with gallbladder sludge, no visible calculi, normal bile ducts.  Coarsened parenchymal echotexture, fatty infiltration vs hepatocellular disease  Consultations:  Cardiology  Hematology  Nephrology  Discharge Exam: Vitals:   02/19/17 0441 02/19/17 0800  BP: (!) 129/95 120/88  Pulse: 84 90  Resp: (!) 24 14  Temp: 98.6 F (37 C) 99 F (37.2 C)  SpO2: 96% 99%   Excited to go home.  No concerns today  General: No acute distress. Cardiovascular: Heart sounds show Francetta Ilg  regular rate, and rhythm. No gallops or rubs. No murmurs. No JVD. Lungs:  Clear to auscultation bilaterally with good air movement. No rales, rhonchi or wheezes. Abdomen: Soft, nontender, nondistended with normal active bowel sounds. No masses. No hepatosplenomegaly. Neurological: Alert and oriented 3. Moves all extremities 4 with equal strength. Cranial nerves II through XII grossly intact. Skin: Warm and dry. No rashes or lesions. Extremities: No clubbing or cyanosis. No edema. Pedal pulses 2+. Psychiatric: Mood and affect are normal. Insight and judgment are appropriate.  Discharge Instructions   Discharge Instructions    Call MD for:  persistant dizziness or light-headedness    Complete by:  As directed    Call MD for:  persistant nausea and vomiting    Complete by:  As directed    Call MD for:  severe uncontrolled pain    Complete by:  As directed    Diet - low sodium heart healthy    Complete by:  As directed    Discharge instructions    Complete by:  As directed    You were seen in the hospital for kidney failure.  Your renal function has stabilized, but it is important that you follow up with nephrology (Dr. Elmarie Shiley) as an outpatient.  We suspect this was due to your significantly high blood pressures causing your blood cells to break down.  For this, you should follow up with Dr. Burr Medico (you need to call to schedule follow up with her).  Please establish with your new primary care doctor within the next week.  You've been started on several new blood pressure medications.  It is extremely important that you take these regularly.  If you don't have Ryleah Miramontes blood pressure cuff, please get one for home.  Check your blood pressure Aceson Labell few times Cricket Goodlin week (if it is greater than 160/110, please call your doctor to let them know).   Increase activity slowly    Complete by:  As directed      Discharge Medication List as of 02/19/2017  3:43 PM    CONTINUE these medications which have CHANGED   Details  carvedilol (COREG) 25 MG tablet Take 1 tablet (25 mg total) by mouth  2 (two) times daily with Mohini Heathcock meal., Starting Thu 02/19/2017, Until Sat 03/21/2017, Normal    cloNIDine (CATAPRES) 0.2 MG tablet Take 1 tablet (0.2 mg total) by mouth 3 (three) times daily., Starting Thu 02/19/2017, Until Sat 03/21/2017, Normal    docusate sodium (COLACE) 100 MG capsule Take 1 capsule (100 mg total) by mouth 2 (two) times daily as needed for mild constipation., Starting Thu 02/19/2017, Until Sat 03/21/2017, Normal    ferrous sulfate 325 (65 FE) MG tablet Take 1 tablet (325 mg total) by mouth 2 (two) times daily with Keegen Heffern meal., Starting Thu 02/19/2017, Until Sat 03/21/2017, Normal    furosemide (LASIX) 80 MG tablet Take 1 tablet (80 mg total) by mouth daily., Starting Fri 02/20/2017, Until Sun 03/22/2017, Normal    hydrALAZINE (APRESOLINE) 100 MG tablet Take 1 tablet (100 mg total) by mouth every 8 (eight) hours., Starting Thu 02/19/2017, Until Sat 03/21/2017, Normal    NIFEdipine (PROCARDIA XL/ADALAT-CC) 90 MG 24 hr tablet Take 1 tablet (90 mg total) by mouth at bedtime., Starting Thu 02/19/2017, Until Sat 03/21/2017, Normal    potassium chloride SA (K-DUR,KLOR-CON) 20 MEQ tablet Take 2 tablets (40 mEq total) by mouth daily., Starting Fri 02/20/2017, Until Sun 03/22/2017, Normal      CONTINUE these medications which have  NOT CHANGED   Details  Aspirin-Acetaminophen-Caffeine (GOODY HEADACHE PO) Take 1 packet by mouth daily as needed. For cramping, Historical Med      STOP taking these medications     labetalol (NORMODYNE) 200 MG tablet        Allergies  Allergen Reactions  . Visine [Tetrahydrozoline Hcl] Other (See Comments)    Eyes Swelling.   Follow-up [place] Follow up on 02/25/2017.   Why:  f/u appointment made for 10:30- please arrive 15 min early and bring medication list to appointment-  Contact information: [place] [address] [phone]       Elmarie Shiley, MD Follow up.   Specialty:   Nephrology Contact information: [place] [address] [phone]        Truitt Merle, MD. Schedule an appointment as soon as possible for Ieisha Gao visit.   Specialties:  Hematology, Oncology Contact information: [place] [address] [phone]            The results of significant diagnostics from this hospitalization (including imaging, microbiology, ancillary and laboratory) are listed below for reference.    Significant Diagnostic Studies: Ct Abdomen Pelvis Wo Contrast  Result Date: 02/12/2017 CLINICAL DATA:  Abdominal pain, tender to palpation. Chest pressure for 4 days. Evaluate potential pleural effusion or pleurisy. History of hypertension. EXAM: CT CHEST, ABDOMEN AND PELVIS WITHOUT CONTRAST TECHNIQUE: Multidetector CT imaging of the chest, abdomen and pelvis was performed following the standard protocol without IV contrast. COMPARISON:  None. FINDINGS: CT CHEST FINDINGS CARDIOVASCULAR: The heart is mildly enlarged. Small pericardial effusion. MEDIASTINUM/NODES: No mediastinal mass. No lymphadenopathy by CT size criteria limited assessment without contrast. Subcentimeter anterior mediastinal lymph node. Thoracic aorta is normal course and caliber. Normal appearance of thoracic esophagus though not tailored for evaluation. LUNGS/PLEURA: Tracheobronchial tree is patent, no pneumothorax. No pleural effusions, focal consolidations, pulmonary nodules or masses. Lingular atelectasis. Minimal dependent atelectasis. MUSCULOSKELETAL: Included soft tissues and included osseous structures appear normal. CT ABDOMEN PELVIS FINDINGS HEPATOBILIARY: Dense gallbladder sludge with mild suspected wall thickening. No biliary dilatation. Minimal focal fatty infiltration about the falciform ligament, liver is otherwise unremarkable. PANCREAS: Normal. SPLEEN: Normal. ADRENALS/URINARY TRACT: Kidneys are orthotopic, demonstrating normal size and morphology. 4 mm RIGHT lower  pole nephrolithiasis. Hydronephrosis; limited assessment for renal masses on this nonenhanced examination. The unopacified ureters are normal in course and caliber. Urinary bladder is partially distended and unremarkable. Normal adrenal glands. STOMACH/BOWEL: The stomach, small and large bowel are normal in course and caliber without inflammatory changes, sensitivity decreased by lack of enteric contrast. Mild amount of retained large bowel stool. Mild colonic diverticulosis. Normal appendix. VASCULAR/LYMPHATIC: Aortoiliac vessels are normal in course and caliber. Mild calcific atherosclerosis. No lymphadenopathy by CT size criteria. REPRODUCTIVE: Enlarged homogeneous uterus with lobulated contour. OTHER: No intraperitoneal free fluid or free air. MUSCULOSKELETAL: Non-acute. Small fat containing inguinal hernias. Small fat containing umbilical hernia. Anterior pelvic wall scarring. Mild S-type scoliosis. IMPRESSION: CT CHEST: 1.  Mild cardiomegaly and small pericardial effusion. 2. No acute pulmonary process. CT ABDOMEN AND PELVIS: 1. Gallbladder sludge and possible cholecystitis though, ultrasound would be more sensitive. 2. Nonobstructing 4 mm RIGHT nephrolithiasis. 3. 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 Electronically Signed   By: Elon Alas M.D.   On: 02/12/2017 22:08   Dg Chest 2 View  Result Date: 02/17/2017 CLINICAL DATA:  Follow-up.  Shortness of breath EXAM: CHEST  2 VIEW COMPARISON:  Three days ago FINDINGS: Cardiomegaly with interval trace effusions. Mild septal thickening, improved. Stable aortic and hilar contours. IMPRESSION: Cardiomegaly and vascular congestion with new trace effusions. Electronically Signed   By: Monte Fantasia M.D.   On: 02/17/2017 07:21   Dg Chest 2 View  Result Date: 02/12/2017 CLINICAL DATA:  Five day history of chest pressure. Current smoker. Current history of hypertension. EXAM: CHEST  2 VIEW COMPARISON:  None. FINDINGS: Cardiac silhouette  moderately to markedly enlarged. Thoracic aorta mildly tortuous. Hilar and mediastinal contours otherwise unremarkable. Lungs clear. Bronchovascular markings normal. Pulmonary vascularity normal. No visible pleural effusions. No pneumothorax. Mild thoracic dextroscoliosis and thoracolumbar levoscoliosis. IMPRESSION: 1.  No acute cardiopulmonary disease. 2. Moderate to marked cardiomegaly. Electronically Signed   By: Evangeline Dakin M.D.   On: 02/12/2017 19:10   Ct Chest Wo Contrast  Result Date: 02/12/2017 CLINICAL DATA:  Abdominal pain, tender to palpation. Chest pressure for 4 days. Evaluate potential pleural effusion or pleurisy. History of hypertension. EXAM: CT CHEST, ABDOMEN AND PELVIS WITHOUT CONTRAST TECHNIQUE: Multidetector CT imaging of the chest, abdomen and pelvis was performed following the standard protocol without IV contrast. COMPARISON:  None. FINDINGS: CT CHEST FINDINGS CARDIOVASCULAR: The heart is mildly enlarged. Small pericardial effusion. MEDIASTINUM/NODES: No mediastinal mass. No lymphadenopathy by CT size criteria limited assessment without contrast. Subcentimeter anterior mediastinal lymph node. Thoracic aorta is normal course and caliber. Normal appearance of thoracic esophagus though not tailored for evaluation. LUNGS/PLEURA: Tracheobronchial tree is patent, no pneumothorax. No pleural effusions, focal consolidations, pulmonary nodules or masses. Lingular atelectasis. Minimal dependent atelectasis. MUSCULOSKELETAL: Included soft tissues and included osseous structures appear normal. CT ABDOMEN PELVIS FINDINGS HEPATOBILIARY: Dense gallbladder sludge with mild suspected wall thickening. No biliary dilatation. Minimal focal fatty infiltration about the falciform ligament, liver is otherwise unremarkable. PANCREAS: Normal. SPLEEN: Normal. ADRENALS/URINARY TRACT: Kidneys are orthotopic, demonstrating normal size and morphology. 4 mm RIGHT lower pole nephrolithiasis. Hydronephrosis; limited  assessment for renal masses on this nonenhanced examination. The unopacified ureters are normal in course and caliber. Urinary bladder is partially distended and unremarkable. Normal adrenal glands. STOMACH/BOWEL: The stomach, small and large bowel are normal in course and caliber without inflammatory changes, sensitivity decreased by lack of enteric contrast. Mild amount of retained large bowel stool. Mild colonic diverticulosis. Normal appendix. VASCULAR/LYMPHATIC: Aortoiliac vessels are normal in course and caliber. Mild calcific atherosclerosis. No lymphadenopathy by CT size criteria. REPRODUCTIVE: Enlarged homogeneous uterus with lobulated contour. OTHER: No intraperitoneal free fluid or free air. MUSCULOSKELETAL: Non-acute. Small fat containing inguinal hernias. Small fat containing umbilical hernia. Anterior pelvic wall scarring. Mild S-type scoliosis. IMPRESSION: CT CHEST: 1.  Mild cardiomegaly and small pericardial effusion. 2. No acute pulmonary process. CT ABDOMEN AND PELVIS: 1. Gallbladder sludge and possible cholecystitis though, ultrasound would be more sensitive. 2. Nonobstructing 4 mm RIGHT nephrolithiasis. 3. 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 Electronically Signed   By: Elon Alas M.D.   On: 02/12/2017 22:08   US Renal  Result Date: 02/13/2017 CLINICAL DATA:  Acute kidney injury EXAM: RENAL / URINARY TRACT ULTRASOUND COMPLETE COMPARISON:  None. FINDINGS: Right Kidney: Length: 10.5 cm. Increased renal cortical echogenicity. 10 mm nonobstructing right renal calculus. No mass or hydronephrosis visualized. Left Kidney: Length: 9.6 cm. Increased renal cortical echogenicity. No mass or hydronephrosis visualized. Bladder: Appears normal for degree of bladder distention. IMPRESSION: 1. No obstructive uropathy. 2. Increased bilateral renal cortical echogenicity as can be seen with medical renal disease or acute kidney injury. Electronically Signed   By: Elbert Ewings  Patel   On:  02/13/2017 09:04   Nm Hepato W/eject Fract  Result Date: 02/13/2017 CLINICAL DATA:  Mid abdominal pain with nausea EXAM: NUCLEAR MEDICINE HEPATOBILIARY IMAGING WITH GALLBLADDER EF TECHNIQUE: Sequential images of the abdomen were obtained out to 60 minutes following intravenous administration of radiopharmaceutical. After oral ingestion of Ensure, gallbladder ejection fraction was determined. At 60 min, normal ejection fraction is greater than 33%. RADIOPHARMACEUTICALS:  5.3 mCi Tc-59m  Choletec IV COMPARISON:  Ultrasound 02/13/2017, CT 02/12/2017 FINDINGS: Prompt uptake and biliary excretion of activity by the liver is seen. Gallbladder activity is visualized, consistent with patency of cystic duct. Biliary activity passes into small bowel, consistent with patent common bile duct. Calculated gallbladder ejection fraction is 22%. (Normal gallbladder ejection fraction with Ensure is greater than 33%.) IMPRESSION: 1. Negative for acute cholecystitis. Patent cystic duct. Normal excretion/ transit of radiotracer into the bowel. 2. Decreased gallbladder ejection fraction of 22% suggesting dyskinesia Electronically Signed   By: Donavan Foil M.D.   On: 02/13/2017 20:32   Dg Chest Port 1 View  Result Date: 02/14/2017 CLINICAL DATA:  Acute onset shortness of breath that began earlier this morning. EXAM: PORTABLE CHEST 1 VIEW COMPARISON:  Chest x-ray and CT chest 02/12/2017. FINDINGS: Cardiac silhouette moderately to markedly enlarged, unchanged. Interval development of mild diffuse interstitial pulmonary edema since the examinations 2 days ago. No confluent airspace consolidation. No visible pleural effusions. IMPRESSION: Mild CHF and/or fluid overload, with development of mild interstitial pulmonary edema since the examinations 2 days ago. Electronically Signed   By: Evangeline Dakin M.D.   On: 02/14/2017 11:19   US Abdomen Limited Ruq  Result Date: 02/13/2017 CLINICAL DATA:  Epigastric pain tonight. EXAM:  ULTRASOUND ABDOMEN LIMITED RIGHT UPPER QUADRANT COMPARISON:  CT 02/12/2017 FINDINGS: Gallbladder: Gallbladder sludge. Mild mural thickening. No pericholecystic fluid. No tenderness to probe pressure over the gallbladder. Common bile duct: Diameter: Normal, 2.7 mm. Liver: Generalized coarsening of hepatic echotexture without focal lesion. Portal vein is patent on color Doppler imaging with normal direction of blood flow towards the liver. IMPRESSION: 1. Gallbladder sludge. No visible calculi. Normal bile ducts. Negative sonographic Murphy sign. 2. Coarsened hepatic parenchymal echotexture could represent fatty infiltration or hepatocellular disease. Electronically Signed   By: Andreas Newport M.D.   On: 02/13/2017 00:43    Microbiology: No results found for this or any previous visit (from the past 240 hour(s)).   Labs: Basic Metabolic Panel:  Recent Labs Lab 02/13/17 0827 02/14/17 0604 02/15/17 0259 02/16/17 0243 02/17/17 0536 02/18/17 0457 02/19/17 0234  NA 134* 132* 131* 135 135 136 133*  K 2.9* 3.5 2.9* 3.1* 3.5 3.5 4.4  CL 101 102 102 105 106 102 102  CO2 22 20* 19* 19* 19* 21* 22  GLUCOSE 107* 133* 142* 117* 143* 127* 149*  BUN 32* 38* 39* 40* 40* 41* 46*  CREATININE 2.63* 3.13* 3.64* 3.52* 3.51* 3.81* 3.89*  CALCIUM 8.5* 8.7* 8.6* 8.8* 8.9 9.0 8.8*  MG 1.6*  --  2.4 2.1 2.0 1.9  --   PHOS  --  3.1 3.2 4.0 4.1 4.9*  --    Liver Function Tests:  Recent Labs Lab 02/15/17 0259 02/16/17 0243 02/17/17 0536 02/18/17 0457 02/19/17 0234  AST 23 24 24 20 18   ALT 18 24 28 23 19   ALKPHOS 46 53 45 48 39  BILITOT 0.8 1.0 0.9 0.6 0.6  PROT 6.1* 6.4* 6.2* 6.4* 5.6*  ALBUMIN 3.0* 3.1* 3.0* 3.2* 2.8*   No results for  input(s): LIPASE, AMYLASE in the last 168 hours. No results for input(s): AMMONIA in the last 168 hours. CBC:  Recent Labs Lab 02/15/17 0259 02/16/17 0243 02/17/17 0536 02/18/17 0457 02/19/17 0234  WBC 7.9 8.6 6.6 6.4 6.4  NEUTROABS 6.1 6.7 4.8 4.7 4.5   HGB 8.4* 8.6* 9.0* 9.9* 8.9*  HCT 28.4* 29.5* 31.3* 33.3* 30.9*  MCV 74.9* 76.4* 77.9* 78.9 79.8  PLT 102* 123* 140* 156 161   Cardiac Enzymes:  Recent Labs Lab 02/13/17 0827  CKTOTAL 37*  CKMB 4.5  TROPONINI 0.39*   BNP: BNP (last 3 results) No results for input(s): BNP in the last 8760 hours.  ProBNP (last 3 results) No results for input(s): PROBNP in the last 8760 hours.  CBG: No results for input(s): GLUCAP in the last 168 hours.     Signed:  Jessica Seidman Melven Sartorius MD.  [facility] 02/19/2017, 8:54 PM

## 2017-02-19 NOTE — Progress Notes (Signed)
Assessment/ Plan:   1.Hypertensive emergency:resolved-stable BP on 5 meds         She will f/u with Dr Elmarie Shiley on 9/24 at 1:15 at [facility](info given to patient).  OK for d/c from renal perspective        Will sign off. 2. Acute kidney injury: BP control and TMA ( cr 0.72 06/2016) 3. Anemia/thrombocytopenia c/wthrombotic microangiopathy, improved 4.Hypokalemia:Improved, will reduce K to daily 5.Hyponatremia: improved  Subjective: Interval History: No SOB  Objective: Vital signs in last 24 hours: Temp:  [97.3 F (36.3 C)-99 F (37.2 C)] 99 F (37.2 C) (09/06 0800) Pulse Rate:  [84-94] 90 (09/06 0800) Resp:  [14-24] 14 (09/06 0800) BP: (120-160)/(81-113) 120/88 (09/06 0800) SpO2:  [95 %-100 %] 99 % (09/06 0800) Weight:  [75.9 kg (167 lb 5.3 oz)] 75.9 kg (167 lb 5.3 oz) (09/06 0441) Weight change: 1.782 kg (3 lb 14.9 oz)  Intake/Output from previous day: 09/05 0701 - 09/06 0700 In: 720 [P.O.:720] Out: 850 [Urine:850] Intake/Output this shift: No intake/output data recorded.  General appearance: alert and cooperative Resp: clear to auscultation bilaterally Chest wall: no tenderness Cardio: regular rate and rhythm, S1, S2 normal, no murmur, click, rub or gallop Extremities: edema tr  Lab Results:  Recent Labs  02/18/17 0457 02/19/17 0234  WBC 6.4 6.4  HGB 9.9* 8.9*  HCT 33.3* 30.9*  PLT 156 161   BMET:  Recent Labs  02/18/17 0457 02/19/17 0234  NA 136 133*  K 3.5 4.4  CL 102 102  CO2 21* 22  GLUCOSE 127* 149*  BUN 41* 46*  CREATININE 3.81* 3.89*  CALCIUM 9.0 8.8*   No results for input(s): PTH in the last 72 hours. Iron Studies: No results for input(s): IRON, TIBC, TRANSFERRIN, FERRITIN in the last 72 hours. Studies/Results: No results found.  Scheduled: . carvedilol  25 mg Oral BID WC  . cloNIDine  0.2 mg Oral TID  . furosemide  80 mg Oral Daily  . hydrALAZINE  100 mg Oral Q8H  . NIFEdipine  90 mg Oral QHS  . potassium chloride  40  mEq Oral BID    LOS: 7 days   Leng Montesdeoca C 02/19/2017,10:03 AM

## 2017-02-19 NOTE — Care Management Note (Signed)
Case Management Note Marvetta Gibbons RN, BSN Unit 4E-Case Manager [phone]  Patient Details  Name: Madeline Brown MRN: [id number] Date of Birth: June 10, 1976  Subjective/Objective:   Pt admitted with HTN emergency and AKI                 Action/Plan: PTA pt lived at home with spouse- anticipate return home- referral for PCP needs received- spoke with pt at bedside regarding PCP- per pt she does not have a primary care MD- has family planning Medicaid- pt open to one of the clinics- will call [place] to see about f/u appointment. Also discussed possible medication assistance- explained to pt that if meds where on generic $4 list then assistance would not be needed- pt states she would be able to afford $4 meds- however if pt is on more expensive meds- CM will assess need for assistance with MATCH once d/c medications known.   Expected Discharge Date:  02/19/17               Expected Discharge Plan:  Home/Self Care  In-House Referral:     Discharge planning Services  CM Consult, Medication Assistance, [facility], Follow-up appt scheduled, MATCH Program  Post Acute Care Choice:    Choice offered to:     DME Arranged:    DME Agency:     HH Arranged:    [place] Agency:     Status of Service:  Completed, signed off  If discussed at [employer] of Stay Meetings, dates discussed:    Discharge Disposition: home/self care   Additional Comments:  02/19/17- 1530- Marvetta Gibbons RN, CM- pt for d/c today- per MD pt will have 5 new scripts- CM reviewed medications with pt- 4 medications are on $4 list- which pt states she can do- however one medication is not which will be around $35- which pt states would be difficult for her in addition to the total cost of $4 meds. - CM will assist pt with MATCH- (confirmed with FC that pt does not have traditional medicaid and only has family planning Medicaid)- explained [place] program to pt- one time use $3 cost per script- pt reports that she will be  able to get all medications on discharge with the reduced cost and will f/u with outpt f/u if she is unable to afford the one medication on an ongoing basis. Pt states as long as meds are on $4 list she should be fine. Per pt she prefers [facility] on [address]- will have MD resend escripts there.   02/18/17- 1130- Marvetta Gibbons RN, CM- call made to [facility] f/u appointment secured for pt on Sept. 12 at 1030- info given to pt on clinic- along with that pt can use [facility] pharmacy at discharge if needed for medications. CM will continue to follow for potential medication needs.   Dawayne Patricia, RN 02/19/2017, 3:46 PM

## 2017-02-20 ENCOUNTER — Telehealth: Payer: Self-pay | Admitting: Hematology

## 2017-02-20 ENCOUNTER — Encounter: Payer: Self-pay | Admitting: Hematology

## 2017-02-20 NOTE — Telephone Encounter (Signed)
Hospital follow up appt has been scheduled for the pt to see Dr. Burr Medico on 10/1 at 945am. Unable to reach the pt. Letter mailed.

## 2017-02-25 ENCOUNTER — Encounter: Payer: Self-pay | Admitting: Physician Assistant

## 2017-02-25 ENCOUNTER — Ambulatory Visit: Payer: Medicaid Other | Attending: Internal Medicine | Admitting: Physician Assistant

## 2017-02-25 VITALS — BP 122/80 | HR 80 | Temp 98.4°F | Resp 18 | Ht 60.0 in | Wt 170.0 lb

## 2017-02-25 DIAGNOSIS — N200 Calculus of kidney: Secondary | ICD-10-CM | POA: Diagnosis not present

## 2017-02-25 DIAGNOSIS — E876 Hypokalemia: Secondary | ICD-10-CM | POA: Insufficient documentation

## 2017-02-25 DIAGNOSIS — D259 Leiomyoma of uterus, unspecified: Secondary | ICD-10-CM | POA: Insufficient documentation

## 2017-02-25 DIAGNOSIS — M311 Thrombotic microangiopathy: Secondary | ICD-10-CM | POA: Diagnosis not present

## 2017-02-25 DIAGNOSIS — N170 Acute kidney failure with tubular necrosis: Secondary | ICD-10-CM | POA: Insufficient documentation

## 2017-02-25 DIAGNOSIS — D696 Thrombocytopenia, unspecified: Secondary | ICD-10-CM | POA: Diagnosis not present

## 2017-02-25 DIAGNOSIS — R1011 Right upper quadrant pain: Secondary | ICD-10-CM | POA: Insufficient documentation

## 2017-02-25 DIAGNOSIS — D649 Anemia, unspecified: Secondary | ICD-10-CM | POA: Diagnosis not present

## 2017-02-25 DIAGNOSIS — E8770 Fluid overload, unspecified: Secondary | ICD-10-CM | POA: Insufficient documentation

## 2017-02-25 DIAGNOSIS — R001 Bradycardia, unspecified: Secondary | ICD-10-CM | POA: Diagnosis not present

## 2017-02-25 DIAGNOSIS — K760 Fatty (change of) liver, not elsewhere classified: Secondary | ICD-10-CM | POA: Insufficient documentation

## 2017-02-25 DIAGNOSIS — R748 Abnormal levels of other serum enzymes: Secondary | ICD-10-CM

## 2017-02-25 DIAGNOSIS — Z7982 Long term (current) use of aspirin: Secondary | ICD-10-CM | POA: Diagnosis not present

## 2017-02-25 DIAGNOSIS — R1013 Epigastric pain: Secondary | ICD-10-CM | POA: Diagnosis not present

## 2017-02-25 DIAGNOSIS — E871 Hypo-osmolality and hyponatremia: Secondary | ICD-10-CM | POA: Insufficient documentation

## 2017-02-25 DIAGNOSIS — D589 Hereditary hemolytic anemia, unspecified: Secondary | ICD-10-CM | POA: Insufficient documentation

## 2017-02-25 DIAGNOSIS — R7989 Other specified abnormal findings of blood chemistry: Secondary | ICD-10-CM

## 2017-02-25 DIAGNOSIS — R778 Other specified abnormalities of plasma proteins: Secondary | ICD-10-CM

## 2017-02-25 DIAGNOSIS — N179 Acute kidney failure, unspecified: Secondary | ICD-10-CM | POA: Diagnosis present

## 2017-02-25 DIAGNOSIS — I10 Essential (primary) hypertension: Secondary | ICD-10-CM | POA: Insufficient documentation

## 2017-02-25 NOTE — Progress Notes (Signed)
Patient ID: Madeline Brown, female   DOB: 06-Apr-1976, 41 y.o.   MRN: [id number] 1.     Madeline Brown, is a 41 y.o. female  [id number]:[id number]  [id number]  DOB - [birth year]  Subjective:  Chief Complaint and HPI: Madeline Brown is a 41 y.o. female here today to establish care and for a follow up visit After being hospitalized 8/30-02/19/2017 for ARF, hypokalemia, abdominal pain, hemolytic anemia, and elevated troponin.  Nephrology, hematology, and cardiology were consulted. See notes summarized below.  Today she presents feeling overall good.  No alcohol intake since hospitalization.  Has nephrology appt 03/09/2017 with Madeline Brown.  No abdominal pain.  Co CP.  Energy levels back to about 80%.  No CP/SOB/dizziness.  No complaints today.   From hospital discharge summary: History of present illness:  Madeline Brown a 41 y.o.female,with uncontrolledhypertension presenteddue to epigastric pain for the past 2 days with Slight nausea. In the ER, pt was found to be in ARF, Hypokalemic, Anemic, and with a Trop 0.38, and Hypertensive Emergency. CT Abd/Pelvis showed Gallbladder sludge and possible cholecystitis, a Nonobstructing 4 mm RIGHT nephrolithiasis. 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 She was transferred to [name] for a Nephrology evaluation and Hematology evaluation and found to have a Hemolytic Anemia. Workup currently in progress and Cardiology, Nephrology, and Hematology consulted to evaluate the Patient. Patient's BP has improved but Kidney Function has worsened. Patient was transfused 1 unit of pRBC's today given her SOB and drop in Hb/Hct. CXR was reviewed and it showed Mild CHF and/or fluid overload, with development of mild interstitial pulmonary edema since the examinations 2 days ago so patient was given IV Lasix 20 mg x1 on 02/15/17. Nephrology evaluated and gave IV Lasix 100 mg for Volume Overload on 02/16/17 and today Nephrology gave 100 mg of Lasix q12h.   Her  picture was ultimately thought to be 2/2 hypertension related thrombotic microangiopathy.     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  Discharged on iron. -Hematology recommending Supportive Care and to Transfuse to Maintain Hb > 8.0 Seen by heme inpatient who felt hypertension emergency related TMA, H/H stable today at d/c, plan to d/c with outpatient heme follow up.    Thrombocytopenia -Platelet Count improved at discharge to normal range -Hematology feel TTP an HUS are low possibilities given improving Platelet Count -ADAMTS13 Activity was 79.2 and Negative r/o TTP -follow up outpatient CBC  AKI/Acute Renal Failure -Stabilized around 3.8 at discharge.   -Nephrology Following and appreciate additional Recc's -Nephrology feels like it was most likely hemodynamically mediated with with HTN Emergency and suspect BP control would be associated with slight rise in Cr prior to Eventual Improvement  -Renal U/S Negative for Obstruction and Urine Lytes sent by Nephrology -Nephrology Discontinued IVF; diuretics per nephrology (now 80 mg PO daily - decreased) Discharged with outpatient follow up with Madeline. Posey Brown on 9/24 at [facility].   Hypertensive Emergency - s/p Nicardipine gtt - elevated PRA, but normal aldo/PRA ratio - discharged on Carvedilol  25 mg po BID, Hydralazine 100 mg po q8h, Nifedipine 90 mg po Daily qHS, clonidine, and lasix 80 mg daily with improved  BP's  Elevated Troponin -Troponin was elevated at 0.44, 0.38, and 0.39 -Cardiology evaluated and feel likley ischemia due to medical illness/ renal failure/ hypertensive emergency -ECHO done and shows normal EF, mild MR, trivial pericardial effusion (unable to eval diastolic dysfunction) -Cardiology recommending conservative approach and considering Outpatient Myoview at D/C  Bradycardia: Tele labeled as 2nd degree type 2, but PR appears to be lengthening which would be 2nd degree type 1. Discussed with Madeline Brown who was in agreement. Continue to monitor, likely no need for further f/u.   Mild Hyponatremia, improved after Diuresis  Hypokalemia - improved - renin/aldosterone notable for normal Aldo/PRA ratio (notably elevated PRA) -Continue to Replete with Potassium Chloride 40 mEQ po BID and Nephrology recommending continuing with supplementation  -Nephrology following and feels that is from Uncontrolled HTN/Kaliuresis Improved at discharge, discharged on 40 mg K daily  Shortness of Breath likely from Volume Overload and low Hb - improved at discharge  Abdominal Pain/Right Upper Quadrant Pain, improved  -CT Abd/Pelvis showed Gallbladder sludge and possible cholecystitis though, ultrasound would be more sensitive. Nonobstructing 4 mm RIGHT nephrolithiasis. 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 -U/S of Gallbladder shows Gallbladder sludge. No visible calculi. Normal bile ducts. Negative sonographic Murphy sign. Coarsened hepatic parenchymal echotexture could represent fatty infiltration or hepatocellular disease. -Lipase was Normal -HIDA Scan done and showed Negative for acute cholecystitis. Patent cystic duct. Normal excretion/ transit of radiotracer into the bowel. Decreased gallbladder ejection fraction of 22% suggesting dyskinesia -Abdominal Pain Improved [ ]  Will discuss with General Surgery about HIDA findings -> rec follow up prn  Fatty infiltration of  liver/Hepatocellular diseases. -Had Mildly increase Bilirubin but now is 1.0 -Recommended, low fat diet, weight lost. Advised to stop drinking alcohol.  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 -Patient aware. Needs to follow with Gynecology as an outpatient   ? UTI -Urinalysis showed Trace Leukocytes, TNTC RBC, TNTC WBC, and No Bacteria -Was started on Empiric Ceftriaxone 8/31-9/5. - continue to monitor    Outpatient recommendations: Recommendations for Outpatient Follow-up:  2. Follow up outpatient CBC and BMP for anemia and renal function/K  3. Elevated PRA (9.4), but normal Aldo/PRA ratio 4. Follow up with renal and heme 5. Follow up abdominal discomfort symptomatically (normal hida, only biliary dyskinesia, follow up prn) 6. Consider outpatient stress test given elevated troponin during admission 7. Recommended discussing birth control with PCP given recent medical complications as well as several antihypertensives which are not recommended in pregnancy (discussed condoms or abstinence recommended until follow up) F/u likely fibroid uterus seen on imaging .   ED/Hospital notes reviewed.   Using condoms for [facility]  ROS:   Constitutional:  No f/c, No night sweats, No unexplained weight loss. EENT:  No vision changes, No blurry vision, No hearing changes. No mouth, throat, or ear problems.  Respiratory: No cough, No SOB Cardiac: No CP, no palpitations GI:  No abd pain, No N/V/D. GU: No Urinary s/sx Musculoskeletal: No joint pain Neuro: No headache, no dizziness, no motor weakness.  Skin: No rash Endocrine:  No polydipsia. No polyuria.  Psych: Denies SI/HI  No problems updated.  ALLERGIES: Allergies  Allergen Reactions  . Visine [Tetrahydrozoline Hcl] Other (See Comments)    Eyes Swelling.    PAST MEDICAL HISTORY: Past Medical History:  Diagnosis Date  . Hypertension  MEDICATIONS AT HOME: Prior to  Admission medications   Medication Sig Start Date End Date Taking? Authorizing Provider  Aspirin-Acetaminophen-Caffeine (GOODY HEADACHE PO) Take 1 packet by mouth daily as needed. For cramping   Yes [provider]  carvedilol (COREG) 25 MG tablet Take 1 tablet (25 mg total) by mouth 2 (two) times daily with a meal. 02/19/17 03/21/17 Yes Ladene Artist., MD  cloNIDine (CATAPRES) 0.2 MG tablet Take 1 tablet (0.2 mg total) by mouth 3 (three) times daily. 02/19/17 03/21/17 Yes Ladene Artist., MD  docusate sodium (COLACE) 100 MG capsule Take 1 capsule (100 mg total) by mouth 2 (two) times daily as needed for mild constipation. 02/19/17 03/21/17 Yes Ladene Artist., MD  ferrous sulfate 325 (65 FE) MG tablet Take 1 tablet (325 mg total) by mouth 2 (two) times daily with a meal. 02/19/17 03/21/17 Yes Ladene Artist., MD  furosemide (LASIX) 80 MG tablet Take 1 tablet (80 mg total) by mouth daily. 02/20/17 03/22/17 Yes Ladene Artist., MD  hydrALAZINE (APRESOLINE) 100 MG tablet Take 1 tablet (100 mg total) by mouth every 8 (eight) hours. 02/19/17 03/21/17 Yes Ladene Artist., MD  NIFEdipine (PROCARDIA XL/ADALAT-CC) 90 MG 24 hr tablet Take 1 tablet (90 mg total) by mouth at bedtime. 02/19/17 03/21/17 Yes Ladene Artist., MD  potassium chloride SA (K-DUR,KLOR-CON) 20 MEQ tablet Take 2 tablets (40 mEq total) by mouth daily. 02/20/17 03/22/17 Yes Ladene Artist., MD     Objective:  EXAM:   Vitals:   02/25/17 1038  BP: 122/80  Pulse: 80  Resp: 18  Temp: 98.4 F (36.9 C)  TempSrc: Oral  SpO2: 99%  Weight: 170 lb (77.1 kg)  Height: 5' (1.524 m)    General appearance : A&OX3. NAD. Non-toxic-appearing HEENT: Atraumatic and Normocephalic.  PERRLA. EOM intact.  TM clear B. Mouth-MMM, post pharynx WNL w/o erythema, No PND. Neck: supple, no JVD. No cervical lymphadenopathy. No thyromegaly Chest/Lungs:  Breathing-non-labored, Good air entry bilaterally, breath sounds normal without  rales, rhonchi, or wheezing  CVS: S1 S2 regular, no murmurs, gallops, rubs  Abdomen: Bowel sounds present, Non tender and not distended with no gaurding, rigidity or rebound. Extremities: Bilateral Lower Ext shows no edema, both legs are warm to touch with = pulse throughout Neurology:  CN II-XII grossly intact, Non focal.   Psych:  TP linear. J/I WNL. Normal speech. Appropriate eye contact and affect.  Skin:  No Rash  Data Review Lab Results  Component Value Date   HGBA1C 4.6 (L) 02/13/2017     Assessment & Plan   1. Anemia, unspecified type Transfusion in hospital, discharged on Iron - CBC with Differential/Platelet  2. Hypokalemia repleted and nephrology advised to continue supplementation for now- - Comprehensive metabolic panel  3. Acute renal failure with tubular necrosis [facility]) Keep nephrology appt 9/24 with Madeline Brown - Comprehensive metabolic panel  4. Epigastric pain Resolved; normal HIDA scan - Comprehensive metabolic panel  5. Thrombocytopenia (HCC) - CBC with Differential/Platelet  6. Elevated troponin Resolved.  Consider cardiology referral for considering Outpatient Myoview per discharge summary but no immediate referral needed.    7. Uterine leiomyoma, unspecified location Also deferred BC planning to gyn.  Condoms stressed for pregnancy prevention for now.   - Ambulatory referral to Gynecology     Patient have been counseled extensively about nutrition and exercise  Return in about 5 weeks (around 04/01/2017) for assign PCP; recheck htn, ARF,  abnormal labs, etc.  The patient was given clear instructions to go to ER or return to medical center if symptoms don't improve, worsen or new problems develop. The patient verbalized understanding. The patient was told to call to get lab results if they haven't heard anything in the next week.     Freeman Caldron, PA-C [facility] [place], [place]   02/25/2017,  10:54 AM

## 2017-02-26 ENCOUNTER — Telehealth: Payer: Self-pay | Admitting: *Deleted

## 2017-02-26 LAB — CBC WITH DIFFERENTIAL/PLATELET
BASOS: 1 %
Basophils Absolute: 0 10*3/uL (ref 0.0–0.2)
EOS (ABSOLUTE): 0.1 10*3/uL (ref 0.0–0.4)
EOS: 2 %
HEMATOCRIT: 35.1 % (ref 34.0–46.6)
HEMOGLOBIN: 10.5 g/dL — AB (ref 11.1–15.9)
IMMATURE GRANULOCYTES: 0 %
Immature Grans (Abs): 0 10*3/uL (ref 0.0–0.1)
Lymphocytes Absolute: 1 10*3/uL (ref 0.7–3.1)
Lymphs: 20 %
MCH: 24.6 pg — ABNORMAL LOW (ref 26.6–33.0)
MCHC: 29.9 g/dL — ABNORMAL LOW (ref 31.5–35.7)
MCV: 82 fL (ref 79–97)
MONOCYTES: 6 %
Monocytes Absolute: 0.3 10*3/uL (ref 0.1–0.9)
NEUTROS PCT: 71 %
Neutrophils Absolute: 3.5 10*3/uL (ref 1.4–7.0)
Platelets: 314 10*3/uL (ref 150–379)
RBC: 4.27 x10E6/uL (ref 3.77–5.28)
RDW: 24.9 % — ABNORMAL HIGH (ref 12.3–15.4)
WBC: 5 10*3/uL (ref 3.4–10.8)

## 2017-02-26 LAB — COMPREHENSIVE METABOLIC PANEL
ALBUMIN: 3.7 g/dL (ref 3.5–5.5)
ALK PHOS: 52 IU/L (ref 39–117)
ALT: 21 IU/L (ref 0–32)
AST: 14 IU/L (ref 0–40)
Albumin/Globulin Ratio: 1.3 (ref 1.2–2.2)
BUN / CREAT RATIO: 14 (ref 9–23)
BUN: 42 mg/dL — AB (ref 6–24)
Bilirubin Total: <0.2 mg/dL (ref 0.0–1.2)
CALCIUM: 9.5 mg/dL (ref 8.7–10.2)
CO2: 22 mmol/L (ref 20–29)
CREATININE: 3.07 mg/dL — AB (ref 0.57–1.00)
Chloride: 103 mmol/L (ref 96–106)
GFR calc Af Amer: 21 mL/min/{1.73_m2} — ABNORMAL LOW (ref >59)
GFR, EST NON AFRICAN AMERICAN: 18 mL/min/{1.73_m2} — AB (ref >59)
GLOBULIN, TOTAL: 2.8 g/dL (ref 1.5–4.5)
GLUCOSE: 89 mg/dL (ref 65–99)
Potassium: 4.5 mmol/L (ref 3.5–5.2)
Sodium: 142 mmol/L (ref 134–144)
Total Protein: 6.5 g/dL (ref 6.0–8.5)

## 2017-02-26 NOTE — Telephone Encounter (Signed)
-----   Message from [place] Donovan, [place] sent at 02/26/2017  8:25 AM EDT ----- Please call patient.  Her electrolytes have improved.  Her kidneys are showing minimal improvement.  Her iron has gone up but isn't back to normal yet.  Continue all current medications including Iron supplements and keep all f/up appts.  Thanks, Freeman Caldron, PA-C

## 2017-02-26 NOTE — Telephone Encounter (Signed)
Medical Assistant left message on patient's home and cell voicemail. Voicemail states to give a call back to [place] with [facility] at [phone]. Patient is aware of electrolytes, kidneys and iron level improving but not back to normal. Patient was advised to continue current medications and follow ups as planned.

## 2017-03-16 ENCOUNTER — Inpatient Hospital Stay: Payer: Self-pay | Admitting: Hematology

## 2017-03-23 DIAGNOSIS — I10 Essential (primary) hypertension: Secondary | ICD-10-CM | POA: Insufficient documentation

## 2017-03-24 ENCOUNTER — Encounter: Payer: Self-pay | Admitting: Hematology

## 2017-03-27 NOTE — Progress Notes (Signed)
No show  This encounter was created in error - please disregard.

## 2017-04-14 NOTE — Progress Notes (Deleted)
HTN Nephro f/u on 03/09/17 ? Cardio f/u outpt myoview? Taking K+ supplements  Anemia Gyn referral

## 2017-04-15 ENCOUNTER — Ambulatory Visit: Payer: Self-pay | Admitting: Family Medicine

## 2017-04-23 ENCOUNTER — Encounter: Payer: Self-pay | Admitting: Obstetrics & Gynecology

## 2017-06-18 ENCOUNTER — Inpatient Hospital Stay (HOSPITAL_COMMUNITY)
Admission: EM | Admit: 2017-06-18 | Discharge: 2017-06-27 | DRG: 264 | Disposition: A | Discharge status: Home / Self Care | Payer: Medicaid Other | Attending: Internal Medicine | Admitting: Internal Medicine

## 2017-06-18 ENCOUNTER — Encounter (HOSPITAL_COMMUNITY): Payer: Self-pay

## 2017-06-18 ENCOUNTER — Emergency Department (HOSPITAL_COMMUNITY): Payer: Medicaid Other

## 2017-06-18 ENCOUNTER — Other Ambulatory Visit: Payer: Self-pay

## 2017-06-18 DIAGNOSIS — I120 Hypertensive chronic kidney disease with stage 5 chronic kidney disease or end stage renal disease: Secondary | ICD-10-CM | POA: Diagnosis present

## 2017-06-18 DIAGNOSIS — E669 Obesity, unspecified: Secondary | ICD-10-CM | POA: Diagnosis present

## 2017-06-18 DIAGNOSIS — F17210 Nicotine dependence, cigarettes, uncomplicated: Secondary | ICD-10-CM | POA: Diagnosis present

## 2017-06-18 DIAGNOSIS — Z79899 Other long term (current) drug therapy: Secondary | ICD-10-CM

## 2017-06-18 DIAGNOSIS — N179 Acute kidney failure, unspecified: Secondary | ICD-10-CM | POA: Diagnosis present

## 2017-06-18 DIAGNOSIS — N186 End stage renal disease: Secondary | ICD-10-CM

## 2017-06-18 DIAGNOSIS — N189 Chronic kidney disease, unspecified: Secondary | ICD-10-CM

## 2017-06-18 DIAGNOSIS — Z9119 Patient's noncompliance with other medical treatment and regimen: Secondary | ICD-10-CM

## 2017-06-18 DIAGNOSIS — K5900 Constipation, unspecified: Secondary | ICD-10-CM | POA: Diagnosis present

## 2017-06-18 DIAGNOSIS — Z992 Dependence on renal dialysis: Secondary | ICD-10-CM

## 2017-06-18 DIAGNOSIS — Z419 Encounter for procedure for purposes other than remedying health state, unspecified: Secondary | ICD-10-CM

## 2017-06-18 DIAGNOSIS — E871 Hypo-osmolality and hyponatremia: Secondary | ICD-10-CM | POA: Diagnosis present

## 2017-06-18 DIAGNOSIS — D691 Qualitative platelet defects: Secondary | ICD-10-CM | POA: Diagnosis present

## 2017-06-18 DIAGNOSIS — D631 Anemia in chronic kidney disease: Secondary | ICD-10-CM | POA: Diagnosis present

## 2017-06-18 DIAGNOSIS — R040 Epistaxis: Secondary | ICD-10-CM | POA: Diagnosis present

## 2017-06-18 DIAGNOSIS — Z6833 Body mass index (BMI) 33.0-33.9, adult: Secondary | ICD-10-CM

## 2017-06-18 DIAGNOSIS — I169 Hypertensive crisis, unspecified: Secondary | ICD-10-CM

## 2017-06-18 DIAGNOSIS — I340 Nonrheumatic mitral (valve) insufficiency: Secondary | ICD-10-CM | POA: Diagnosis present

## 2017-06-18 DIAGNOSIS — I161 Hypertensive emergency: Principal | ICD-10-CM | POA: Diagnosis present

## 2017-06-18 DIAGNOSIS — E872 Acidosis: Secondary | ICD-10-CM | POA: Diagnosis present

## 2017-06-18 DIAGNOSIS — J810 Acute pulmonary edema: Secondary | ICD-10-CM | POA: Diagnosis present

## 2017-06-18 LAB — I-STAT CHEM 8, ED
BUN: 73 mg/dL — ABNORMAL HIGH (ref 6–20)
CALCIUM ION: 1.15 mmol/L (ref 1.15–1.40)
Chloride: 107 mmol/L (ref 101–111)
Creatinine, Ser: 6.9 mg/dL — ABNORMAL HIGH (ref 0.44–1.00)
GLUCOSE: 122 mg/dL — AB (ref 65–99)
HCT: 26 % — ABNORMAL LOW (ref 36.0–46.0)
HEMOGLOBIN: 8.8 g/dL — AB (ref 12.0–15.0)
POTASSIUM: 3.9 mmol/L (ref 3.5–5.1)
SODIUM: 136 mmol/L (ref 135–145)
TCO2: 18 mmol/L — ABNORMAL LOW (ref 22–32)

## 2017-06-18 MED ORDER — LABETALOL HCL 5 MG/ML IV SOLN
0.5000 mg/min | INTRAVENOUS | Status: DC
  Administered 2017-06-19: 0.5 mg/min via INTRAVENOUS
  Filled 2017-06-18: qty 80

## 2017-06-18 NOTE — ED Notes (Signed)
Patient denies pain and is resting comfortably.  

## 2017-06-18 NOTE — ED Triage Notes (Signed)
Pt arrives to ED from home with complaints of nosebleed since 1900. EMS reports pt also presented with high BP of 256/160. Pt was seen in August 2018 for same BP; states renal function is 25% r/t htn. Pt placed in position of comfort with bed locked and lowered, call bell in reach.

## 2017-06-18 NOTE — ED Notes (Signed)
Patient transported to X-ray 

## 2017-06-19 ENCOUNTER — Other Ambulatory Visit: Payer: Self-pay

## 2017-06-19 DIAGNOSIS — I169 Hypertensive crisis, unspecified: Secondary | ICD-10-CM | POA: Diagnosis not present

## 2017-06-19 DIAGNOSIS — D631 Anemia in chronic kidney disease: Secondary | ICD-10-CM | POA: Diagnosis present

## 2017-06-19 DIAGNOSIS — E871 Hypo-osmolality and hyponatremia: Secondary | ICD-10-CM | POA: Diagnosis present

## 2017-06-19 DIAGNOSIS — N179 Acute kidney failure, unspecified: Secondary | ICD-10-CM | POA: Diagnosis present

## 2017-06-19 DIAGNOSIS — E669 Obesity, unspecified: Secondary | ICD-10-CM | POA: Diagnosis present

## 2017-06-19 DIAGNOSIS — I120 Hypertensive chronic kidney disease with stage 5 chronic kidney disease or end stage renal disease: Secondary | ICD-10-CM | POA: Diagnosis present

## 2017-06-19 DIAGNOSIS — D691 Qualitative platelet defects: Secondary | ICD-10-CM | POA: Diagnosis present

## 2017-06-19 DIAGNOSIS — Z6833 Body mass index (BMI) 33.0-33.9, adult: Secondary | ICD-10-CM | POA: Diagnosis not present

## 2017-06-19 DIAGNOSIS — R040 Epistaxis: Secondary | ICD-10-CM | POA: Diagnosis present

## 2017-06-19 DIAGNOSIS — Z9119 Patient's noncompliance with other medical treatment and regimen: Secondary | ICD-10-CM | POA: Diagnosis not present

## 2017-06-19 DIAGNOSIS — N185 Chronic kidney disease, stage 5: Secondary | ICD-10-CM | POA: Diagnosis not present

## 2017-06-19 DIAGNOSIS — E872 Acidosis: Secondary | ICD-10-CM | POA: Diagnosis present

## 2017-06-19 DIAGNOSIS — I161 Hypertensive emergency: Secondary | ICD-10-CM | POA: Diagnosis present

## 2017-06-19 DIAGNOSIS — N186 End stage renal disease: Secondary | ICD-10-CM | POA: Diagnosis present

## 2017-06-19 DIAGNOSIS — F17210 Nicotine dependence, cigarettes, uncomplicated: Secondary | ICD-10-CM | POA: Diagnosis present

## 2017-06-19 DIAGNOSIS — N189 Chronic kidney disease, unspecified: Secondary | ICD-10-CM | POA: Diagnosis not present

## 2017-06-19 DIAGNOSIS — J810 Acute pulmonary edema: Secondary | ICD-10-CM | POA: Diagnosis present

## 2017-06-19 DIAGNOSIS — I340 Nonrheumatic mitral (valve) insufficiency: Secondary | ICD-10-CM | POA: Diagnosis present

## 2017-06-19 DIAGNOSIS — K5900 Constipation, unspecified: Secondary | ICD-10-CM | POA: Diagnosis present

## 2017-06-19 DIAGNOSIS — I10 Essential (primary) hypertension: Secondary | ICD-10-CM | POA: Diagnosis not present

## 2017-06-19 DIAGNOSIS — Z79899 Other long term (current) drug therapy: Secondary | ICD-10-CM | POA: Diagnosis not present

## 2017-06-19 LAB — PROTIME-INR
INR: 1.07
Prothrombin Time: 13.8 seconds (ref 11.4–15.2)

## 2017-06-19 LAB — CBC WITH DIFFERENTIAL/PLATELET
BASOS ABS: 0 10*3/uL (ref 0.0–0.1)
Basophils Relative: 0 %
EOS PCT: 3 %
Eosinophils Absolute: 0.1 10*3/uL (ref 0.0–0.7)
HCT: 30.2 % — ABNORMAL LOW (ref 36.0–46.0)
Hemoglobin: 9.8 g/dL — ABNORMAL LOW (ref 12.0–15.0)
LYMPHS PCT: 23 %
Lymphs Abs: 1.2 10*3/uL (ref 0.7–4.0)
MCH: 27.1 pg (ref 26.0–34.0)
MCHC: 32.5 g/dL (ref 30.0–36.0)
MCV: 83.4 fL (ref 78.0–100.0)
MONO ABS: 0.3 10*3/uL (ref 0.1–1.0)
Monocytes Relative: 7 %
Neutro Abs: 3.4 10*3/uL (ref 1.7–7.7)
Neutrophils Relative %: 67 %
PLATELETS: 124 10*3/uL — AB (ref 150–400)
RBC: 3.62 MIL/uL — ABNORMAL LOW (ref 3.87–5.11)
RDW: 16.5 % — AB (ref 11.5–15.5)
WBC: 5.1 10*3/uL (ref 4.0–10.5)

## 2017-06-19 LAB — COMPREHENSIVE METABOLIC PANEL
ALK PHOS: 46 U/L (ref 38–126)
ALT: 155 U/L — AB (ref 14–54)
AST: 83 U/L — AB (ref 15–41)
Albumin: 2.9 g/dL — ABNORMAL LOW (ref 3.5–5.0)
Anion gap: 12 (ref 5–15)
BILIRUBIN TOTAL: 0.9 mg/dL (ref 0.3–1.2)
BUN: 79 mg/dL — AB (ref 6–20)
CALCIUM: 8.3 mg/dL — AB (ref 8.9–10.3)
CO2: 15 mmol/L — ABNORMAL LOW (ref 22–32)
CREATININE: 6.54 mg/dL — AB (ref 0.44–1.00)
Chloride: 106 mmol/L (ref 101–111)
GFR calc Af Amer: 8 mL/min — ABNORMAL LOW (ref >60)
GFR calc non Af Amer: 7 mL/min — ABNORMAL LOW (ref >60)
GLUCOSE: 110 mg/dL — AB (ref 65–99)
POTASSIUM: 3.7 mmol/L (ref 3.5–5.1)
Sodium: 133 mmol/L — ABNORMAL LOW (ref 135–145)
Total Protein: 6 g/dL — ABNORMAL LOW (ref 6.5–8.1)

## 2017-06-19 LAB — OCCULT BLOOD GASTRIC / DUODENUM (SPECIMEN CUP): Occult Blood, Gastric: POSITIVE — AB

## 2017-06-19 LAB — RAPID URINE DRUG SCREEN, HOSP PERFORMED
AMPHETAMINES: NOT DETECTED
BARBITURATES: NOT DETECTED
BENZODIAZEPINES: NOT DETECTED
COCAINE: NOT DETECTED
Opiates: NOT DETECTED
TETRAHYDROCANNABINOL: NOT DETECTED

## 2017-06-19 LAB — BASIC METABOLIC PANEL
ANION GAP: 10 (ref 5–15)
BUN: 82 mg/dL — ABNORMAL HIGH (ref 6–20)
CHLORIDE: 105 mmol/L (ref 101–111)
CO2: 16 mmol/L — AB (ref 22–32)
Calcium: 8.3 mg/dL — ABNORMAL LOW (ref 8.9–10.3)
Creatinine, Ser: 6.75 mg/dL — ABNORMAL HIGH (ref 0.44–1.00)
GFR calc non Af Amer: 7 mL/min — ABNORMAL LOW (ref >60)
GFR, EST AFRICAN AMERICAN: 8 mL/min — AB (ref >60)
Glucose, Bld: 136 mg/dL — ABNORMAL HIGH (ref 65–99)
POTASSIUM: 3.8 mmol/L (ref 3.5–5.1)
SODIUM: 131 mmol/L — AB (ref 135–145)

## 2017-06-19 LAB — I-STAT CG4 LACTIC ACID, ED: LACTIC ACID, VENOUS: 0.6 mmol/L (ref 0.5–1.9)

## 2017-06-19 LAB — MRSA PCR SCREENING: MRSA by PCR: NEGATIVE

## 2017-06-19 LAB — CBC
HEMATOCRIT: 29 % — AB (ref 36.0–46.0)
HEMOGLOBIN: 9.1 g/dL — AB (ref 12.0–15.0)
MCH: 26.1 pg (ref 26.0–34.0)
MCHC: 31.4 g/dL (ref 30.0–36.0)
MCV: 83.1 fL (ref 78.0–100.0)
Platelets: 164 10*3/uL (ref 150–400)
RBC: 3.49 MIL/uL — AB (ref 3.87–5.11)
RDW: 16.5 % — ABNORMAL HIGH (ref 11.5–15.5)
WBC: 5 10*3/uL (ref 4.0–10.5)

## 2017-06-19 LAB — APTT: APTT: 29 s (ref 24–36)

## 2017-06-19 LAB — MAGNESIUM: Magnesium: 1.9 mg/dL (ref 1.7–2.4)

## 2017-06-19 LAB — SAMPLE TO BLOOD BANK

## 2017-06-19 LAB — PHOSPHORUS: PHOSPHORUS: 4.2 mg/dL (ref 2.5–4.6)

## 2017-06-19 LAB — BRAIN NATRIURETIC PEPTIDE: B Natriuretic Peptide: >4500 pg/mL — ABNORMAL HIGH (ref 0.0–100.0)

## 2017-06-19 LAB — TROPONIN I: Troponin I: <0.03 ng/mL (ref <0.03)

## 2017-06-19 MED ORDER — ACETAMINOPHEN 325 MG PO TABS
650.0000 mg | ORAL_TABLET | Freq: Three times a day (TID) | ORAL | Status: DC | PRN
  Administered 2017-06-19 – 2017-06-20 (×2): 650 mg via ORAL
  Filled 2017-06-19 (×2): qty 2

## 2017-06-19 MED ORDER — LABETALOL HCL 5 MG/ML IV SOLN
10.0000 mg | Freq: Four times a day (QID) | INTRAVENOUS | Status: DC | PRN
  Administered 2017-06-19 (×2): 10 mg via INTRAVENOUS
  Filled 2017-06-19 (×3): qty 4

## 2017-06-19 MED ORDER — CLONIDINE HCL 0.2 MG PO TABS
0.2000 mg | ORAL_TABLET | Freq: Three times a day (TID) | ORAL | Status: DC
  Administered 2017-06-19 – 2017-06-27 (×20): 0.2 mg via ORAL
  Filled 2017-06-19 (×20): qty 1

## 2017-06-19 MED ORDER — FUROSEMIDE 10 MG/ML IJ SOLN
80.0000 mg | Freq: Once | INTRAMUSCULAR | Status: AC
  Administered 2017-06-19: 80 mg via INTRAVENOUS
  Filled 2017-06-19: qty 8

## 2017-06-19 MED ORDER — SODIUM CHLORIDE 0.9 % IV SOLN
80.0000 mg | Freq: Once | INTRAVENOUS | Status: AC
  Administered 2017-06-19: 80 mg via INTRAVENOUS
  Filled 2017-06-19: qty 80

## 2017-06-19 MED ORDER — PANTOPRAZOLE SODIUM 40 MG IV SOLR: 40.0000 mg | Freq: Two times a day (BID) | INTRAVENOUS | Status: DC

## 2017-06-19 MED ORDER — ACETAMINOPHEN 325 MG PO TABS: 325.0000 mg | ORAL_TABLET | Freq: Once | ORAL | Status: DC

## 2017-06-19 MED ORDER — CARVEDILOL 25 MG PO TABS
25.0000 mg | ORAL_TABLET | Freq: Two times a day (BID) | ORAL | Status: DC
  Administered 2017-06-19 – 2017-06-27 (×14): 25 mg via ORAL
  Filled 2017-06-19 (×14): qty 1

## 2017-06-19 MED ORDER — NICARDIPINE HCL IN NACL 40-0.83 MG/200ML-% IV SOLN
3.0000 mg/h | INTRAVENOUS | Status: DC
  Administered 2017-06-19: 3 mg/h via INTRAVENOUS
  Administered 2017-06-20: 5 mg/h via INTRAVENOUS
  Filled 2017-06-19 (×2): qty 200

## 2017-06-19 MED ORDER — PNEUMOCOCCAL VAC POLYVALENT 25 MCG/0.5ML IJ INJ: 0.5000 mL | INJECTION | INTRAMUSCULAR | Status: DC | PRN

## 2017-06-19 MED ORDER — NICARDIPINE HCL IN NACL 20-0.86 MG/200ML-% IV SOLN
INTRAVENOUS | Status: AC
  Administered 2017-06-19: 20 mg
  Filled 2017-06-19: qty 200

## 2017-06-19 MED ORDER — SODIUM CHLORIDE 0.9 % IV SOLN
250.0000 mL | INTRAVENOUS | Status: DC | PRN
  Administered 2017-06-24 (×2): via INTRAVENOUS

## 2017-06-19 MED ORDER — HYDRALAZINE HCL 25 MG PO TABS
100.0000 mg | ORAL_TABLET | Freq: Three times a day (TID) | ORAL | Status: DC
  Administered 2017-06-19 – 2017-06-27 (×21): 100 mg via ORAL
  Filled 2017-06-19 (×22): qty 4

## 2017-06-19 MED ORDER — ACETAMINOPHEN 325 MG PO TABS
650.0000 mg | ORAL_TABLET | Freq: Once | ORAL | Status: AC
  Administered 2017-06-19: 650 mg via ORAL
  Filled 2017-06-19: qty 2

## 2017-06-19 MED ORDER — NIFEDIPINE ER OSMOTIC RELEASE 90 MG PO TB24
90.0000 mg | ORAL_TABLET | Freq: Every day | ORAL | Status: DC
  Administered 2017-06-19 – 2017-06-26 (×8): 90 mg via ORAL
  Filled 2017-06-19 (×9): qty 1

## 2017-06-19 MED ORDER — SODIUM CHLORIDE 0.9 % IV SOLN
8.0000 mg/h | INTRAVENOUS | Status: DC
  Administered 2017-06-19 – 2017-06-20 (×3): 8 mg/h via INTRAVENOUS
  Filled 2017-06-19 (×6): qty 80

## 2017-06-19 MED ORDER — NICARDIPINE HCL IN NACL 20-0.86 MG/200ML-% IV SOLN
3.0000 mg/h | Freq: Once | INTRAVENOUS | Status: AC
  Administered 2017-06-19: 5 mg/h via INTRAVENOUS
  Filled 2017-06-19 (×2): qty 200

## 2017-06-19 MED ORDER — INFLUENZA VAC SPLIT QUAD 0.5 ML IM SUSY: 0.5000 mL | PREFILLED_SYRINGE | INTRAMUSCULAR | Status: DC | PRN

## 2017-06-19 MED ORDER — SODIUM BICARBONATE 650 MG PO TABS
650.0000 mg | ORAL_TABLET | Freq: Three times a day (TID) | ORAL | Status: DC
  Administered 2017-06-19 – 2017-06-20 (×7): 650 mg via ORAL
  Filled 2017-06-19 (×7): qty 1

## 2017-06-19 MED ORDER — STERILE WATER FOR INJECTION IV SOLN
INTRAVENOUS | Status: DC
  Filled 2017-06-19: qty 850

## 2017-06-19 NOTE — Progress Notes (Signed)
Stopped in to visit with patient.  Patient states "it is not my time yet" as she talks on the phone.  :-)  We chuckled together.  I shared with patient that I also check in to be a friendly face for patients and their families.  She states that she is fine and thanks me for stopping in.      06/19/17 1020  Clinical Encounter Type  Visited With Patient  Visit Type Initial;Spiritual support

## 2017-06-19 NOTE — Care Management Note (Signed)
Case Management Note Marvetta Gibbons RN, BSN Unit 4E-Case Manager-- [place] coverage [phone]  Patient Details  Name: Brylie Sneath MRN: [id number] Date of Birth: 1975-12-29  Subjective/Objective:  Pt admitted with HTN emergency- hx of CKD stage 4- follows with [facility].   Upper GIB               Action/Plan: PTA Pt lived at home- CM to follow for transition of care needs  Expected Discharge Date:                  Expected Discharge Plan:  Home/Self Care  In-House Referral:     Discharge planning Services  CM Consult  Post Acute Care Choice:    Choice offered to:     DME Arranged:    DME Agency:     HH Arranged:    HH Agency:     Status of Service:  In process, will continue to follow  If discussed at Long Length of Stay Meetings, dates discussed:    Discharge Disposition:   Additional Comments:  Dawayne Patricia, RN 06/19/2017, 10:07 AM

## 2017-06-19 NOTE — H&P (Signed)
PULMONARY / CRITICAL CARE MEDICINE   Name: Madeline Brown MRN: [id number] DOB: Mar 14, 1976    ADMISSION DATE:  06/18/2017 CONSULTATION DATE:  06/19/2017  REFERRING MD: Rolland Porter, MD   CHIEF COMPLAINT:  Epistaxis and Hypertension   HISTORY OF PRESENT ILLNESS:   [name] is a 42 yo F with a history of HTN, CKD IV, and medication nonadeherance who presented with 1 day of repeated epistaxis. She reported 5 minutes of left sided epistxis this morning, which was followed by repeat episodes of self limiting epstaxsis starting around 7 pm today, She She states she has been experiencing dyspnea upon exertion starting today with walking distances as short as 10 feet (with a previous episode of DOE on 12/31), which improves with rest. She also endorses orthopnea, but denies dyspnea at rest. She has noticed new bruises on her legs and arm, which she states were not caused by trauma. She endorses lethargy, nausea, 2 days of constipation, and focal low back pain (starting while in the ED. She denies headache, vision changes, chest pain, urinary symptoms, fever.  Her hypertension is manage by her nephrologist, who she last saw in October. She states she is prescribed Carvedilol, Clonidine, Lisinopril, and Lasix. She takes her Carvedilol and Clonidine every day (including this morning). She last took her lisinopril 3 days ago and has not been taking her lasix due to it making her urinate often. While being examined patient had an episode of projectile vomiting of coffee ground and bright red blood emesis.  In the ED, patient BP 220s/140s, HR 70s, RR 18-25, Saturating 99% on RA. CBC showed Hgb 9.8 (baseline 10.4 for the last year); CMP showed Cr 6.5 (Baseline 3.0), Na 133, Bicarb 15, AST 83, ALT 155; Lactate, Coags, and Troponin were WNL; BNP elevated at >4500. CXR showed worsening pulmonary edema and improving pleural effusions. She was placed on a labetalol drip and received 8m IV lasix. Patient to be admitted to  the ICU for further workup and care.   PAST MEDICAL HISTORY :  She  has a past medical history of Hypertension.  PAST SURGICAL HISTORY: She  has a past surgical history that includes Cesarean section.  Allergies  Allergen Reactions  . Visine [Tetrahydrozoline Hcl] Other (See Comments)    Eyes Swelling.    No current facility-administered medications on file prior to encounter.    Current Outpatient Medications on File Prior to Encounter  Medication Sig  . Aspirin-Acetaminophen-Caffeine (GOODY HEADACHE PO) Take 1 packet by mouth daily as needed. For cramping  . carvedilol (COREG) 25 MG tablet Take 1 tablet (25 mg total) by mouth 2 (two) times daily with a meal.  . cloNIDine (CATAPRES) 0.2 MG tablet Take 1 tablet (0.2 mg total) by mouth 3 (three) times daily.  . furosemide (LASIX) 80 MG tablet Take 1 tablet (80 mg total) by mouth daily.  .Marland KitchenLISINOPRIL PO Take 1 tablet by mouth daily.   . ferrous sulfate 325 (65 FE) MG tablet Take 1 tablet (325 mg total) by mouth 2 (two) times daily with a meal. (Patient not taking: Reported on 06/18/2017)  . hydrALAZINE (APRESOLINE) 100 MG tablet Take 1 tablet (100 mg total) by mouth every 8 (eight) hours. (Patient not taking: Reported on 06/18/2017)  . NIFEdipine (PROCARDIA XL/ADALAT-CC) 90 MG 24 hr tablet Take 1 tablet (90 mg total) by mouth at bedtime. (Patient not taking: Reported on 06/18/2017)  . potassium chloride SA (K-DUR,KLOR-CON) 20 MEQ tablet Take 2 tablets (40 mEq total) by mouth daily. (  Patient not taking: Reported on 06/18/2017)    FAMILY HISTORY:  Her indicated that her mother is deceased. She indicated that her father is deceased.   SOCIAL HISTORY: She  reports that she has been smoking cigarettes.  She has a 3.75 pack-year smoking history. she has never used smokeless tobacco. She reports that she drinks alcohol. She reports that she uses drugs. Drug: Marijuana.  REVIEW OF SYSTEMS:   Performed and negative except as indicated in  HPI.  SUBJECTIVE:  Concerned about her episode of bloody emesis. Otherwise no acute distress.  VITAL SIGNS: BP (!) 218/147   Pulse 81   Temp 98.8 F (37.1 C) (Oral)   Resp 17   Ht 5' (1.524 m)   Wt 170 lb (77.1 kg)   SpO2 99%   BMI 33.20 kg/m   HEMODYNAMICS:    VENTILATOR SETTINGS:    INTAKE / OUTPUT: No intake/output data recorded.  PHYSICAL EXAMINATION: Physical Exam  Constitutional: She is oriented to person, place, and time. She appears well-developed and well-nourished.  HENT:  Head: Normocephalic and atraumatic.  Dried blood in nostril  Eyes: EOM are normal. Right eye exhibits no discharge. Left eye exhibits no discharge.  Cardiovascular: Normal rate and regular rhythm.  Pulmonary/Chest: Effort normal. No respiratory distress.  Decreased breath sounds  Abdominal: Soft. There is no tenderness. There is no guarding.  Musculoskeletal: She exhibits no edema or deformity.  Neurological: She is alert and oriented to person, place, and time.  Skin: Skin is warm and dry.  1-2cm bruise noted at right proximal upper arm    LABS:  BMET Recent Labs  Lab 06/18/17 2213 06/19/17 0045  NA 136 133*  K 3.9 3.7  CL 107 106  CO2  --  15*  BUN 73* 79*  CREATININE 6.90* 6.54*  GLUCOSE 122* 110*    Electrolytes Recent Labs  Lab 06/19/17 0045  CALCIUM 8.3*    CBC Recent Labs  Lab 06/18/17 2213 06/19/17 0045  WBC  --  5.1  HGB 8.8* 9.8*  HCT 26.0* 30.2*  PLT  --  124*    Coag's Recent Labs  Lab 06/19/17 0045  APTT 29  INR 1.07    Sepsis Markers Recent Labs  Lab 06/19/17 0033  LATICACIDVEN 0.60    ABG No results for input(s): PHART, PCO2ART, PO2ART in the last 168 hours.  Liver Enzymes Recent Labs  Lab 06/19/17 0045  AST 83*  ALT 155*  ALKPHOS 46  BILITOT 0.9  ALBUMIN 2.9*    Cardiac Enzymes Recent Labs  Lab 06/19/17 0045  TROPONINI <0.03    Glucose No results for input(s): GLUCAP in the last 168 hours.  Imaging Dg Chest  2 View  Result Date: 06/18/2017 CLINICAL DATA:  Shortness of breath.  Worsening renal insufficiency. EXAM: CHEST  2 VIEW COMPARISON:  Radiograph 02/17/2017 FINDINGS: Cardiomegaly is similar to prior exam. Unchanged mediastinal contours. Small bilateral pleural effusions, improved from prior exam. Mild fluid in the fissures. Vascular congestion with development of mild pulmonary edema. No confluent airspace disease. No pneumothorax. No acute osseous abnormality. IMPRESSION: Worsening pulmonary edema. Persistent but improving pleural effusions. Cardiomegaly is unchanged. Electronically Signed   By: Jeb Levering M.D.   On: 06/18/2017 23:52    STUDIES:  CXR 1/3  CULTURES: None  ANTIBIOTICS: None  SIGNIFICANT EVENTS: - Admission 1/4 - Coffee Ground / Bloody Emesis 1/4  LINES/TUBES: None  DISCUSSION: 42 yo F with history of HTN and CKD IV who presents with Hypertensive emergency,  epistaxis, acute on chronic renal failure and upper GI Bleed.  ASSESSMENT / PLAN:  PULMONARY A: Pulmonary Edema with Small Bilateral Pulmonary effusions 2/2 CKD IV Reports 2 days of DOE and Orthopnea (O2 Sat 99% on RA, no desat upon ambulation in ED) P:   Repeat CXR in AM Maintain O2 Sat >92 Treat Hypertension as below  CARDIOVASCULAR A:  Hypertensive Emergency Echo 02/13/17: EF 50-55%, Mild Mitral Regurgitation EKG Pending  BNP > 4500, 2/2 CKD IV with some cardiogenic component P:  Received Labetalol gtt in ED  Received Lasix 57m IV in ED Continue Clonidine 0.268mq8h Continue Carvedilol 252mID Continue Nifedipine 32m58ms Hydralazine 100mg14m Nicardipine gtt with 2 hour goal of 180 S762olic, 110 d831tolic UDS, to screen for substances contributing to hypertension Cardiac monitoring  RENAL A:   Acute on Chronic Renal Failure Stage IV 2/2 Hypertensive crisis Mild Hyponatremia P:   Consult Nephrology ([facility] BMET, Mg, Phos in AM Na Bicarb 150mEq7m1L  Sterile H2O @ 75mL/h24montinuous I/O Treat Hypertension as above  GASTROINTESTINAL A:  Upper GI Bleed Elevated AST 83 and ALT 155, Normal Alk Phos P:   Gastric Occult Blood IV PPI Bolus and Infusion GI Consult for management recommendations Treat Hypertension as above  HEMATOLOGIC A:   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 Do not suspect infections component. No empiric antibiotics given. P:   Monitor fever curve and white count  ENDOCRINE A:   Hypocalcemia and Low Vit D 2/2 CKD IV Random CBGs <200 P:    Monitor CBG  NEUROLOGIC A:    Alert and Oriented P:   Monitor  FAMILY  - Updates: No family present.  - Inter-disciplinary family meet or Palliative Care meeting due by: 06/26/17   [facility] [place] [facility] [phone], 2:36 AM

## 2017-06-19 NOTE — ED Notes (Signed)
Pulse OX 100% while ambulating.

## 2017-06-19 NOTE — Progress Notes (Signed)
PCCM Interval Note  Assessed patient on rounds today.  She is more comfortable, is up to chair and interacting.  Has a normal respiratory pattern.  She remains on nicardipine drip.  Blood pressures improved although her diastolic blood pressure is still greater than 100.  Plan to reinitiate enteral medications, hopefully wean nicardipine to off. On scheduled coreg, clonidine, hydralazine, nifedipine.  I will hold off on repeat Lasix given her tenuous renal status.  She sees [facility] in the outpatient setting.  We will consult Nephrology and get assistance on appropriate timing for her lasix.   Add labetalol prn.   Independent CC time 30 minutes   Baltazar Apo, MD, PhD 06/19/2017, 2:45 PM [facility] [phone] or if no answer [phone]

## 2017-06-19 NOTE — ED Provider Notes (Signed)
[facility] EMERGENCY DEPARTMENT Provider Note   CSN: [id number] Arrival date & time: 06/18/17  2128  Time seen 23:04 PM    History   Chief Complaint Chief Complaint  Patient presents with  . Epistaxis  . Hypertension    HPI Madeline Brown is a 42 y.o. female.  HPI patient has a history of hypertension with noncompliance and chronic renal insufficiency.  She reports she started having left-sided epistaxis this morning around 8 AM that lasted about 5 minutes.  It restarted again about 7 PM and has been off and on.  She reports URI symptoms for the past week with some cough is nonproductive, but denies rhinorrhea, sneezing, or fever.  She states December 31 she felt short of breath and then today she has not had shortness of breath.  She describes dyspnea on exertion with only ambulating 10 feet and states it takes her 10 minutes to recover.  She also describes PND.  She denies any swelling of her extremities but does describe having no energy.  She also states she has had some bruising for the past 3 weeks that comes without trauma.  Area.  She states urination has been normal.  She was last seen by Dr. Posey Pronto, nephrologist in October however she missed her December 5 appointment.  She states her kidney function was at 15% and when he saw her in October it was up to 30%.  He states when he got up to 50% function they would discuss having a fistula placed.  She states she is taking her Coreg, clonidine, but is not taking her Lasix because it makes her urinate too often.  She states he started her on lisinopril in October and she states she is taking it.  PCP none Nephrology Dr Posey Pronto  Past Medical History:  Diagnosis Date  . Hypertension     Patient Active Problem List   Diagnosis Date Noted  . Hypertension 03/23/2017  . 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 Abdominal pain 02/14/2017  . Thrombocytopenia ([place])   . ARF (acute renal failure) ([place])  02/12/2017  . Anemia 02/12/2017  . Hypokalemia 02/12/2017  . Elevated troponin 02/12/2017  . Hyponatremia 02/12/2017  . Tachycardia 02/12/2017  . Epigastric pain     Past Surgical History:  Procedure Laterality Date  . CESAREAN SECTION     x2    OB History    Gravida Para Term Preterm AB Living   8 5 5   2 5    SAB TAB Ectopic Multiple Live Births     2     5       Home Medications    Prior to Admission medications   Medication Sig Start Date End Date Taking? Authorizing Provider  Aspirin-Acetaminophen-Caffeine (GOODY HEADACHE PO) Take 1 packet by mouth daily as needed. For cramping   Yes [provider]  carvedilol (COREG) 25 MG tablet Take 1 tablet (25 mg total) by mouth 2 (two) times daily with a meal. 02/19/17 06/18/17 Yes Elodia Florence., MD  cloNIDine (CATAPRES) 0.2 MG tablet Take 1 tablet (0.2 mg total) by mouth 3 (three) times daily. 02/19/17 06/18/17 Yes Elodia Florence., MD  furosemide (LASIX) 80 MG tablet Take 1 tablet (80 mg total) by mouth daily. 02/20/17 06/18/17 Yes Elodia Florence., MD  LISINOPRIL PO Take 1 tablet by mouth daily.    Yes [provider]  ferrous sulfate 325 (65 FE) MG tablet Take 1 tablet (325  mg total) by mouth 2 (two) times daily with a meal. Patient not taking: Reported on 06/18/2017 02/19/17 03/21/17  Elodia Florence., MD  hydrALAZINE (APRESOLINE) 100 MG tablet Take 1 tablet (100 mg total) by mouth every 8 (eight) hours. Patient not taking: Reported on 06/18/2017 02/19/17 03/21/17  Elodia Florence., MD  NIFEdipine (PROCARDIA XL/ADALAT-CC) 90 MG 24 hr tablet Take 1 tablet (90 mg total) by mouth at bedtime. Patient not taking: Reported on 06/18/2017 02/19/17 03/21/17  Elodia Florence., MD  potassium chloride SA (K-DUR,KLOR-CON) 20 MEQ tablet Take 2 tablets (40 mEq total) by mouth daily. Patient not taking: Reported on 06/18/2017 02/20/17 03/22/17  Elodia Florence., MD    Family History Family History  Problem  Relation Age of Onset  . Diabetes Father   . Hypertension Father   . Cancer Father     Social History Social History   Tobacco Use  . Smoking status: Current Every Day Smoker    Packs/day: 0.25    Years: 15.00    Pack years: 3.75    Types: Cigarettes  . Smokeless tobacco: Never Used  Substance Use Topics  . Alcohol use: Yes    Comment: occassional beer  . Drug use: Yes    Types: Marijuana  employed Smokes 4 cigs a day   Allergies   Visine [tetrahydrozoline hcl]   Review of Systems Review of Systems  All other systems reviewed and are negative.    Physical Exam Updated Vital Signs BP (!) 225/146   Pulse 80   Temp 98.8 F (37.1 C) (Oral)   Resp (!) 22   Ht 5' (1.524 m)   Wt 77.1 kg (170 lb)   SpO2 98%   BMI 33.20 kg/m   Vital signs normal except for hypertension   Physical Exam  Constitutional: She is oriented to person, place, and time. She appears well-developed and well-nourished.  Non-toxic appearance. She does not appear ill. No distress.  HENT:  Head: Normocephalic and atraumatic.  Right Ear: External ear normal.  Left Ear: External ear normal.  Nose: Nose normal. No mucosal edema or rhinorrhea.  Mouth/Throat: Oropharynx is clear and moist and mucous membranes are normal. No dental abscesses or uvula swelling.  Small amount of blood in the left nostril without active bleeding  Eyes: Conjunctivae and EOM are normal. Pupils are equal, round, and reactive to light.  Pale conjunctiva  Neck: Normal range of motion and full passive range of motion without pain. Neck supple.  Cardiovascular: Normal rate, regular rhythm and normal heart sounds. Exam reveals no gallop and no friction rub.  No murmur heard. Pulmonary/Chest: Effort normal. No respiratory distress. She has decreased breath sounds. She has no wheezes. She has no rhonchi. She has no rales. She exhibits no tenderness and no crepitus.  Abdominal: Soft. Normal appearance and bowel sounds are  normal. She exhibits no distension. There is no tenderness. There is no rebound and no guarding.  Musculoskeletal: Normal range of motion. She exhibits no edema or tenderness.  Moves all extremities well.   Neurological: She is alert and oriented to person, place, and time. She has normal strength. No cranial nerve deficit.  Skin: Skin is warm, dry and intact. No rash noted. No erythema. There is pallor.  Patient has a small bruise on her posterior right upper arm in a odd position for trauma.  Psychiatric: She has a normal mood and affect. Her speech is normal and behavior is normal. Her mood  appears not anxious.  Nursing note and vitals reviewed.    ED Treatments / Results  Labs (all labs ordered are listed, but only abnormal results are displayed)  Results for orders placed or performed during the hospital encounter of 06/18/17  Comprehensive metabolic panel  Result Value Ref Range   Sodium 133 (L) 135 - 145 mmol/L   Potassium 3.7 3.5 - 5.1 mmol/L   Chloride 106 101 - 111 mmol/L   CO2 15 (L) 22 - 32 mmol/L   Glucose, Bld 110 (H) 65 - 99 mg/dL   BUN 79 (H) 6 - 20 mg/dL   Creatinine, Ser 6.54 (H) 0.44 - 1.00 mg/dL   Calcium 8.3 (L) 8.9 - 10.3 mg/dL   Total Protein 6.0 (L) 6.5 - 8.1 g/dL   Albumin 2.9 (L) 3.5 - 5.0 g/dL   AST 83 (H) 15 - 41 U/L   ALT 155 (H) 14 - 54 U/L   Alkaline Phosphatase 46 38 - 126 U/L   Total Bilirubin 0.9 0.3 - 1.2 mg/dL   GFR calc non Af Amer 7 (L) >60 mL/min   GFR calc Af Amer 8 (L) >60 mL/min   Anion gap 12 5 - 15  CBC with Differential  Result Value Ref Range   WBC 5.1 4.0 - 10.5 K/uL   RBC 3.62 (L) 3.87 - 5.11 MIL/uL   Hemoglobin 9.8 (L) 12.0 - 15.0 g/dL   HCT 30.2 (L) 36.0 - 46.0 %   MCV 83.4 78.0 - 100.0 fL   MCH 27.1 26.0 - 34.0 pg   MCHC 32.5 30.0 - 36.0 g/dL   RDW 16.5 (H) 11.5 - 15.5 %   Platelets 124 (L) 150 - 400 K/uL   Neutrophils Relative % 67 %   Neutro Abs 3.4 1.7 - 7.7 K/uL   Lymphocytes Relative 23 %   Lymphs Abs 1.2 0.7 -  4.0 K/uL   Monocytes Relative 7 %   Monocytes Absolute 0.3 0.1 - 1.0 K/uL   Eosinophils Relative 3 %   Eosinophils Absolute 0.1 0.0 - 0.7 K/uL   Basophils Relative 0 %   Basophils Absolute 0.0 0.0 - 0.1 K/uL  Protime-INR  Result Value Ref Range   Prothrombin Time 13.8 11.4 - 15.2 seconds   INR 1.07   APTT  Result Value Ref Range   aPTT 29 24 - 36 seconds  Brain natriuretic peptide  Result Value Ref Range   B Natriuretic Peptide >4,500.0 (H) 0.0 - 100.0 pg/mL  Troponin I  Result Value Ref Range   Troponin I <0.03 <0.03 ng/mL  I-Stat Chem 8, ED  Result Value Ref Range   Sodium 136 135 - 145 mmol/L   Potassium 3.9 3.5 - 5.1 mmol/L   Chloride 107 101 - 111 mmol/L   BUN 73 (H) 6 - 20 mg/dL   Creatinine, Ser 6.90 (H) 0.44 - 1.00 mg/dL   Glucose, Bld 122 (H) 65 - 99 mg/dL   Calcium, Ion 1.15 1.15 - 1.40 mmol/L   TCO2 18 (L) 22 - 32 mmol/L   Hemoglobin 8.8 (L) 12.0 - 15.0 g/dL   HCT 26.0 (L) 36.0 - 46.0 %  I-Stat CG4 Lactic Acid, ED  Result Value Ref Range   Lactic Acid, Venous 0.60 0.5 - 1.9 mmol/L  Sample to Blood Bank  Result Value Ref Range   Blood Bank Specimen SAMPLE AVAILABLE FOR TESTING    Sample Expiration 06/20/2017    Laboratory interpretation all normal except worsening renal failure since September, very  elevated BNP, stable anemia, mild thrombocytopenia, non-anion gap acidosis, elevation of LFTs consistent with vascular congestion of the liver   EKG  EKG Interpretation None       ED ECG REPORT   Date: 06/19/2017  Rate: 77  Rhythm: normal sinus rhythm  QRS Axis: left  Intervals: prolonged QTI  ST/T Wave abnormalities: nonspecific T wave changes  Conduction Disutrbances:none  Narrative Interpretation: LVH  Old EKG Reviewed: none available  I have personally reviewed the EKG tracing and agree with the computerized printout as noted.   Radiology Dg Chest 2 View  Result Date: 06/18/2017 CLINICAL DATA:  Shortness of breath.  Worsening renal  insufficiency. EXAM: CHEST  2 VIEW COMPARISON:  Radiograph 02/17/2017 FINDINGS: Cardiomegaly is similar to prior exam. Unchanged mediastinal contours. Small bilateral pleural effusions, improved from prior exam. Mild fluid in the fissures. Vascular congestion with development of mild pulmonary edema. No confluent airspace disease. No pneumothorax. No acute osseous abnormality. IMPRESSION: Worsening pulmonary edema. Persistent but improving pleural effusions. Cardiomegaly is unchanged. Electronically Signed   By: Jeb Levering M.D.   On: 06/18/2017 23:52    Procedures .Critical Care Performed by: Rolland Porter, MD Authorized by: Rolland Porter, MD   Critical care provider statement:    Critical care time (minutes):  39   Critical care was necessary to treat or prevent imminent or life-threatening deterioration of the following conditions:  Cardiac failure, renal failure and circulatory failure   Critical care was time spent personally by me on the following activities:  Discussions with consultants, examination of patient, obtaining history from patient or surrogate, ordering and performing treatments and interventions, ordering and review of laboratory studies, ordering and review of radiographic studies, pulse oximetry, re-evaluation of patient's condition and review of old charts   (including critical care time)  Medications Ordered in ED Medications  labetalol (NORMODYNE,TRANDATE) 500 mg in dextrose 5 % 125 mL (4 mg/mL) infusion (2.5 mg/min Intravenous Rate/Dose Change 06/19/17 0130)  nicardipine (CARDENE) 20mg  in 0.86% saline 245ml IV infusion (0.1 mg/ml) (not administered)  furosemide (LASIX) injection 80 mg (80 mg Intravenous Given 06/19/17 0138)  acetaminophen (TYLENOL) tablet 650 mg (650 mg Oral Given 06/19/17 0228)     Initial Impression / Assessment and Plan / ED Course  I have reviewed the triage vital signs and the nursing notes.  Pertinent labs & imaging results that were available  during my care of the patient were reviewed by me and considered in my medical decision making (see chart for details).    We discussed her i-STAT test results which were concerning for worsening of her chronic renal insufficiency.  Additional blood work was done including blood work to evaluate her for her easy bruisability that she describes over the past couple weeks.  Patient also appears to be very pale and may be very anemic.  She states she does have heavy periods.  The chest x-ray and EKG were also done.  Patient was started on a labetalol drip for her hypertension.  1 AM after reviewing her chest x-ray she was given Lasix IV, she was given 80 mg which is her usual oral dose at home.   Patient was ambulated by nursing staff and her pulse ox remained 100% on room air.  1:45 AM I have reviewed all of her blood test results, consult was made to the hospitalist for admission.  2:06 AM Dr. Tamala Julian, hospitalist called back, he states since patient is on a titratable drip she needs to go to the  ICU.  2:25 AM Dr. Jimmy Footman, intensivist states she will have her service admit.  She agrees with starting the Cardene drip and stopping the labetalol drip if the Cardene controls her blood pressure better.   Final Clinical Impressions(s) / ED Diagnoses   Final diagnoses:  Hypertensive crisis  Acute renal failure superimposed on chronic kidney disease, unspecified CKD stage, unspecified acute renal failure type [facility])  Epistaxis    Plan admission  Rolland Porter, MD, Barbette Or, MD 06/19/17 [phone]

## 2017-06-20 ENCOUNTER — Inpatient Hospital Stay (HOSPITAL_COMMUNITY): Payer: Medicaid Other

## 2017-06-20 DIAGNOSIS — I161 Hypertensive emergency: Principal | ICD-10-CM

## 2017-06-20 LAB — BASIC METABOLIC PANEL
ANION GAP: 11 (ref 5–15)
BUN: 81 mg/dL — AB (ref 6–20)
CHLORIDE: 105 mmol/L (ref 101–111)
CO2: 15 mmol/L — ABNORMAL LOW (ref 22–32)
Calcium: 8.1 mg/dL — ABNORMAL LOW (ref 8.9–10.3)
Creatinine, Ser: 7.81 mg/dL — ABNORMAL HIGH (ref 0.44–1.00)
GFR calc Af Amer: 7 mL/min — ABNORMAL LOW (ref >60)
GFR calc non Af Amer: 6 mL/min — ABNORMAL LOW (ref >60)
Glucose, Bld: 167 mg/dL — ABNORMAL HIGH (ref 65–99)
POTASSIUM: 4 mmol/L (ref 3.5–5.1)
SODIUM: 131 mmol/L — AB (ref 135–145)

## 2017-06-20 LAB — CBC
HCT: 30.6 % — ABNORMAL LOW (ref 36.0–46.0)
HEMOGLOBIN: 9.5 g/dL — AB (ref 12.0–15.0)
MCH: 26.3 pg (ref 26.0–34.0)
MCHC: 31 g/dL (ref 30.0–36.0)
MCV: 84.8 fL (ref 78.0–100.0)
Platelets: 163 10*3/uL (ref 150–400)
RBC: 3.61 MIL/uL — AB (ref 3.87–5.11)
RDW: 17.3 % — ABNORMAL HIGH (ref 11.5–15.5)
WBC: 5.2 10*3/uL (ref 4.0–10.5)

## 2017-06-20 MED ORDER — PANTOPRAZOLE SODIUM 40 MG PO TBEC
40.0000 mg | DELAYED_RELEASE_TABLET | Freq: Every day | ORAL | Status: DC
Start: 2017-06-20 — End: 2017-06-27
  Administered 2017-06-20 – 2017-06-27 (×7): 40 mg via ORAL
  Filled 2017-06-20 (×8): qty 1

## 2017-06-20 MED ORDER — FUROSEMIDE 10 MG/ML IJ SOLN
160.0000 mg | Freq: Two times a day (BID) | INTRAVENOUS | Status: DC
  Administered 2017-06-20 – 2017-06-22 (×4): 160 mg via INTRAVENOUS
  Filled 2017-06-20: qty 16
  Filled 2017-06-20: qty 4
  Filled 2017-06-20: qty 16
  Filled 2017-06-20 (×2): qty 10
  Filled 2017-06-20: qty 16

## 2017-06-20 NOTE — Consult Note (Signed)
[name] Admit Date: 06/18/2017 06/20/2017 Rexene Agent Requesting Physician:  Lamonte Sakai MD  Reason for Consult:  AoCKD3, HTN Emergency HPI:  42 year old female admitted 1/3 with a presenting symptom of epistaxis, orthopnea, exertional dyspnea.  PMH Incudes:  History of malignant hypertension with admission in August 2018 with thrombocytopenia, renal failure, hypertensive TMA.  Nonadherence to medical therapy, especially antihypertensive medications  CKD 3 with a creatinine of 2.17 on 9/24 when seen by Dr. Posey Pronto at our office, this was her nadir after her admission in August.  Tobacco use  Obesity  Ever since her discharge the patient has had difficulty with affording and taking her medications on a consistent basis.  Her outpatient blood pressure regimen is carvedilol 25 mg twice daily, clonidine 0.2 mg 3 times daily, Lasix 80 mg daily, hydralazine 100 mg 3 times daily, nifedipine 90 mg daily, lisinopril 20 mg daily.  Patient states that she was bearing on use of her medications because of difficulty affording them.  When she presented to the emergency room on 1/3 her blood pressure was very elevated with systolic above 989 and diastolic above 211.  She had acute on chronic renal failure with a creatinine of 6.9 and a BUN of 73 at that time.  She was admitted to the ICU, placed on nicardipine parenterally, and her oral blood pressure medications were resumed.  Overnight her nicardipine was discontinued and she is now on an oral regimen as listed below.  Fortunately her creatinine is continued to worsen, today is 7.81 with a BUN of 81, bicarbonate 15, potassium 4.0.  Her platelet count is 163 with a hemoglobin 9.5.  She has had 0.7 L of urine output in the past 24 hours with 2 unmeasured voids.  Her weight today is 78 kg which is about 5 kg more than her weight at our office in September.  She has no anorexia, nausea, vomiting, hiccups, dysgeusia.  She denies use of any nonsteroidals  during the past month, though does admit to taking high doses of acetaminophen.  2 view chest x-ray on admission had pulmonary edema with resolving pleural effusions, follow-up portable film has demonstrated improving edema.  Creatinine, Ser (mg/dL)  Date Value  06/20/2017 7.81 (H)  06/19/2017 6.75 (H)  06/19/2017 6.54 (H)  06/18/2017 6.90 (H)  02/25/2017 3.07 (H)  02/19/2017 3.89 (H)  02/18/2017 3.81 (H)  02/17/2017 3.51 (H)  02/16/2017 3.52 (H)  02/15/2017 3.64 (H)  ] I/Os: I/O last 3 completed shifts: In: 1635.5 [P.O.:120; I.V.:1415.5; IV Piggyback:100] Out: 1125 [Urine:1125]   ROS NSAIDS: Denies IV Contrast no exposure TMP/SMX no exposure Balance of 12 systems is negative w/ exceptions as above  PMH  Past Medical History:  Diagnosis Date  . Hypertension    PSH  Past Surgical History:  Procedure Laterality Date  . CESAREAN SECTION     x2   FH  Family History  Problem Relation Age of Onset  . Diabetes Father   . Hypertension Father   . Cancer Father    SH  reports that she has been smoking cigarettes.  She has a 3.75 pack-year smoking history. she has never used smokeless tobacco. She reports that she drinks alcohol. She reports that she uses drugs. Drug: Marijuana. Allergies  Allergies  Allergen Reactions  . Visine [Tetrahydrozoline Hcl] Other (See Comments)    Eyes Swelling.   Home medications Prior to Admission medications   Medication Sig Start Date End Date Taking? Authorizing Provider  Aspirin-Acetaminophen-Caffeine (GOODY HEADACHE PO) Take 1  packet by mouth daily as needed. For cramping   Yes [provider]  carvedilol (COREG) 25 MG tablet Take 1 tablet (25 mg total) by mouth 2 (two) times daily with a meal. 02/19/17 06/18/17 Yes Elodia Florence., MD  cloNIDine (CATAPRES) 0.2 MG tablet Take 1 tablet (0.2 mg total) by mouth 3 (three) times daily. 02/19/17 06/18/17 Yes Elodia Florence., MD  furosemide (LASIX) 80 MG tablet Take 1 tablet  (80 mg total) by mouth daily. 02/20/17 06/18/17 Yes Elodia Florence., MD  LISINOPRIL PO Take 1 tablet by mouth daily.    Yes [provider]  ferrous sulfate 325 (65 FE) MG tablet Take 1 tablet (325 mg total) by mouth 2 (two) times daily with a meal. Patient not taking: Reported on 06/18/2017 02/19/17 03/21/17  Elodia Florence., MD  hydrALAZINE (APRESOLINE) 100 MG tablet Take 1 tablet (100 mg total) by mouth every 8 (eight) hours. Patient not taking: Reported on 06/18/2017 02/19/17 03/21/17  Elodia Florence., MD  NIFEdipine (PROCARDIA XL/ADALAT-CC) 90 MG 24 hr tablet Take 1 tablet (90 mg total) by mouth at bedtime. Patient not taking: Reported on 06/18/2017 02/19/17 03/21/17  Elodia Florence., MD  potassium chloride SA (K-DUR,KLOR-CON) 20 MEQ tablet Take 2 tablets (40 mEq total) by mouth daily. Patient not taking: Reported on 06/18/2017 02/20/17 03/22/17  Elodia Florence., MD    Current Medications Scheduled Meds: . carvedilol  25 mg Oral BID WC  . cloNIDine  0.2 mg Oral Q8H  . hydrALAZINE  100 mg Oral Q8H  . NIFEdipine  90 mg Oral QHS  . [START ON 06/22/2017] pantoprazole  40 mg Intravenous Q12H  . sodium bicarbonate  650 mg Oral TID   Continuous Infusions: . sodium chloride    . niCARDipine Stopped (06/20/17 0715)  . pantoprozole (PROTONIX) infusion 8 mg/hr (06/20/17 0800)   PRN Meds:.sodium chloride, acetaminophen, Influenza vac split quadrivalent PF, labetalol, pneumococcal 23 valent vaccine  CBC Recent Labs  Lab 06/19/17 0045 06/19/17 0803 06/20/17 1103  WBC 5.1 5.0 5.2  NEUTROABS 3.4  --   --   HGB 9.8* 9.1* 9.5*  HCT 30.2* 29.0* 30.6*  MCV 83.4 83.1 84.8  PLT 124* 164 062   Basic Metabolic Panel Recent Labs  Lab 06/18/17 2213 06/19/17 0045 06/19/17 0803 06/20/17 1103  NA 136 133* 131* 131*  K 3.9 3.7 3.8 4.0  CL 107 106 105 105  CO2  --  15* 16* 15*  GLUCOSE 122* 110* 136* 167*  BUN 73* 79* 82* 81*  CREATININE 6.90* 6.54* 6.75* 7.81*  CALCIUM   --  8.3* 8.3* 8.1*  PHOS  --   --  4.2  --     Physical Exam  Blood pressure (!) 136/96, pulse 79, temperature 98.1 F (36.7 C), temperature source Oral, resp. rate 19, height 5' (1.524 m), weight 78.2 kg (172 lb 6.4 oz), last menstrual period 06/20/2017, SpO2 99 %, unknown if currently breastfeeding. GEN: NAD, obese, ENT: NCAT EYES: EOMI CV: nl s1s2, no murmur or rub, regular PULM: CTAB ABD: s/nt/nd SKIN: no rashes/lesions EXT:2+ LEE b/l No asterixus, nonfocal, AAOx3  Assessment 60M with hx/o Malignant HTN with TMA admitted with HTN emergency and AoCKD3  1. 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 BL SCr 2.17 03/09/17; sees [facility]el at [facility]  2. Not uremic; but very low GFR; discussed might need HD during this admission 3. Renal US last year with increased echogenicity and R 78mm stone,  don't think needs repeating w/o renal colic and adequate UOP 2. Uncontrolled HTN with HTN emergency 1. Improved with restart of outpt regimen, holding ACEi 2. Hypervoelmic on exam, needs diuretsis 3. Nonadherence to BP meds as outpt, partially from cost   Plan 1. Cont BP meds as they are 2. Add lasix 160 IV BID 3. Follow closely, might need HD if doesn't improve with improved BP 4. Daily weights, Daily Renal Panel, Strict I/Os, Avoid nephrotoxins (NSAIDs, judicious IV Contrast)    Pearson Grippe MD [phone] pgr 06/20/2017, 12:31 PM

## 2017-06-20 NOTE — H&P (Signed)
PULMONARY / CRITICAL CARE MEDICINE   Name: Madeline Brown MRN: [id number] DOB: 1975/09/05    ADMISSION DATE:  06/18/2017 CONSULTATION DATE:  06/19/2017  REFERRING MD: Rolland Porter, MD   CHIEF COMPLAINT:  Epistaxis and Hypertension   BRIEF [name] is a 42 yo F with a history of HTN, CKD IV, and medication nonadeherance who presented with 1 day of repeated epistaxis. She reported 5 minutes of left sided epistxis this morning, which was followed by repeat episodes of self limiting epstaxsis starting around 7 pm today, She She states she has been experiencing dyspnea upon exertion starting today with walking distances as short as 10 feet (with a previous episode of DOE on 12/31), which improves with rest. She also endorses orthopnea, but denies dyspnea at rest. She has noticed new bruises on her legs and arm, which she states were not caused by trauma. She endorses lethargy, nausea, 2 days of constipation, and focal low back pain (starting while in the ED. She denies headache, vision changes, chest pain, urinary symptoms, fever.  Her hypertension is manage by her nephrologist, who she last saw in October. She states she is prescribed Carvedilol, Clonidine, Lisinopril, and Lasix. She takes her Carvedilol and Clonidine every day (including this morning). She last took her lisinopril 3 days ago and has not been taking her lasix due to it making her urinate often. While being examined patient had an episode of projectile vomiting of coffee ground and bright red blood emesis.  In the ED, patient BP 220s/140s, HR 70s, RR 18-25, Saturating 99% on RA. CBC showed Hgb 9.8 (baseline 10.4 for the last year); CMP showed Cr 6.5 (Baseline 3.0), Na 133, Bicarb 15, AST 83, ALT 155; Lactate, Coags, and Troponin were WNL; BNP elevated at >4500. CXR showed worsening pulmonary edema and improving pleural effusions. She was placed on a labetalol drip and received 30m IV lasix. Patient to be admitted to the ICU for further  workup and care.   CULTURES: None  ANTIBIOTICS: None  LINES/TUBES: None    SIGNIFICANT EVENTS: - Admission 1/4 - Coffee Ground / Bloody Emesis 1/4: Concerned about her episode of bloody emesis. Otherwise no acute distress.   SUBJECTIVE/OVERNIGHT/INTERVAL HX 06/20/2017 - off cardene gtt. On protonix gtt for possible gi bleeding but RN and patient say - 1 episode of vomit that was dark in ER. No other vomit or even blood loss history. On oral agents and bp better. Creat rising to 7.8 (RN says home baseline 3's). Also on   VITAL SIGNS: BP (!) 136/96   Pulse 79   Temp 98.1 F (36.7 C) (Oral)   Resp 19   Ht 5' (1.524 m)   Wt 78.2 kg (172 lb 6.4 oz)   LMP 06/20/2017 (Exact Date)   SpO2 99%   BMI 33.67 kg/m   HEMODYNAMICS:    VENTILATOR SETTINGS:    INTAKE / OUTPUT: I/O last 3 completed shifts: In: 1635.5 [P.O.:120; I.V.:1415.5; IV Piggyback:100] Out: 1125 [Urine:1125]  PHYSICAL EXAMINATION: Physical Exam  Constitutional: She is oriented to person, place, and time. She appears well-developed and well-nourished.  HENT:  Head: Normocephalic and atraumatic.  Dried blood in nostril  Eyes: EOM are normal. Right eye exhibits no discharge. Left eye exhibits no discharge.  Cardiovascular: Normal rate and regular rhythm.  Pulmonary/Chest: Effort normal and breath sounds normal. No stridor. She has no wheezes. She has no rales. She exhibits no tenderness.  Decreased breath sounds  Abdominal: Soft. She exhibits no distension and no mass.  There is no tenderness. There is no rebound and no guarding. No hernia.  Musculoskeletal: Normal range of motion. She exhibits no deformity.  Neurological: She is alert and oriented to person, place, and time. No cranial nerve deficit. She exhibits normal muscle tone. Coordination normal.  Skin: Skin is warm and dry.  1-2cm bruise noted at right proximal upper arm    LABS: PULMONARY Recent Labs  Lab 06/18/17 2213  TCO2 18*     CBC Recent Labs  Lab 06/19/17 0045 06/19/17 0803 06/20/17 1103  HGB 9.8* 9.1* 9.5*  HCT 30.2* 29.0* 30.6*  WBC 5.1 5.0 5.2  PLT 124* 164 163    COAGULATION Recent Labs  Lab 06/19/17 0045  INR 1.07    CARDIAC   Recent Labs  Lab 06/19/17 0045  TROPONINI <0.03   No results for input(s): PROBNP in the last 168 hours.   CHEMISTRY Recent Labs  Lab 06/18/17 2213 06/19/17 0045 06/19/17 0803 06/20/17 1103  NA 136 133* 131* 131*  K 3.9 3.7 3.8 4.0  CL 107 106 105 105  CO2  --  15* 16* 15*  GLUCOSE 122* 110* 136* 167*  BUN 73* 79* 82* 81*  CREATININE 6.90* 6.54* 6.75* 7.81*  CALCIUM  --  8.3* 8.3* 8.1*  MG  --   --  1.9  --   PHOS  --   --  4.2  --    Estimated Creatinine Clearance: 8.9 mL/min (A) (by C-G formula based on SCr of 7.81 mg/dL (H)).   LIVER Recent Labs  Lab 06/19/17 0045  AST 83*  ALT 155*  ALKPHOS 46  BILITOT 0.9  PROT 6.0*  ALBUMIN 2.9*  INR 1.07     INFECTIOUS Recent Labs  Lab 06/19/17 0033  LATICACIDVEN 0.60     ENDOCRINE CBG (last 3)  No results for input(s): GLUCAP in the last 72 hours.       IMAGING x48h  - image(s) personally visualized  -   highlighted in bold Dg Chest 2 View  Result Date: 06/18/2017 CLINICAL DATA:  Shortness of breath.  Worsening renal insufficiency. EXAM: CHEST  2 VIEW COMPARISON:  Radiograph 02/17/2017 FINDINGS: Cardiomegaly is similar to prior exam. Unchanged mediastinal contours. Small bilateral pleural effusions, improved from prior exam. Mild fluid in the fissures. Vascular congestion with development of mild pulmonary edema. No confluent airspace disease. No pneumothorax. No acute osseous abnormality. IMPRESSION: Worsening pulmonary edema. Persistent but improving pleural effusions. Cardiomegaly is unchanged. Electronically Signed   By: Jeb Levering M.D.   On: 06/18/2017 23:52   Dg Chest Port 1 View  Result Date: 06/20/2017 CLINICAL DATA:  Respiratory failure. EXAM: PORTABLE CHEST 1  VIEW COMPARISON:  06/18/2017. FINDINGS: The heart is enlarged. The lung fields are clear. There is no bony abnormality. Marked improvement in BILATERAL lung opacity, likely resolving pulmonary edema compared with radiographic 2 days earlier. IMPRESSION: Improved aeration, presumed clearing edema. Electronically Signed   By: Staci Righter M.D.   On: 06/20/2017 09:04    DISCUSSION: 42 yo F with history of HTN and CKD IV who presents with Hypertensive emergency, epistaxis, acute on chronic renal failure and upper GI Bleed.  ASSESSMENT / PLAN:  PULMONARY A: Pulmonary Edema with Small Bilateral Pulmonary effusions 2/2 CKD IV Reports 2 days of DOE and Orthopnea (O2 Sat 99% on RA, no desat upon ambulation in ED)   - 06/20/2017  On room air  P:   Pulse ox > 92%  CARDIOVASCULAR A:  Hypertensive Emergency Echo  02/13/17: EF 50-55%, Mild Mitral Regurgitation EKG Pending  BNP > 4500, 2/2 CKD IV with some cardiogenic component  06/20/2017 - bp normal after oral agents. Off gtt . UDS negative at admit  P:  Continue Clonidine 0.47m q8h Continue Carvedilol 229mBID Continue Nifedipine 9073mhs Hydralazine 100m37mh Cardiac monitoring  RENAL A:   Acute on Chronic Renal Failure Stage IV 2/2 Hypertensive crisis   - worse 06/20/2017:    P:   Consult Nephrology (Patient follows with [facility]) - Dr SanfJoelyn Omsing 06/20/2017  GASTROINTESTINAL Recent Labs  Lab 06/19/17 0045 06/19/17 0803 06/20/17 1103  HGB 9.8* 9.1* 9.5*  HCT 30.2* 29.0* 30.6*  WBC 5.1 5.0 5.2  PLT 124* 164 163    A:  Upper GI Bleed Elevated AST 83 and ALT 155, Normal Alk Phos  06/20/2017 - no evidence of bleed (baseline hgb 10s) and she is not far off      P:   chagne IV PPI to po Treat Hypertension as above GI consult dependingn on coruse  HEMATOLOGIC A:   Anemia of chronic disease 2/2 CKD IV  BUN 79, likely causing platelet dysfunction Upper GI Bleed Epstaxsis Normal Coags    P:  - PRBC for hgb  </= 6.9gm%    - exceptions are   -  if ACS susepcted/confirmed then transfuse for hgb </= 8.0gm%,  or    -  active bleeding with hemodynamic instability, then transfuse regardless of hemoglobin value   At at all times try to transfuse 1 unit prbc as possible with exception of active hemorrhage   INFECTIOUS A:  Afebrile, No Leukocytosis, Normal Lactate. Do not suspect infections component. No empiric antibiotics given. P:   Monitor fever curve and white count  ENDOCRINE A:   Hypocalcemia and Low Vit D 2/2 CKD IV Random CBGs <200 P:    Monitor CBG  NEUROLOGIC A:    Alert and Oriented P:   Monitor  FAMILY  - Updates: patient and significant other at bedside  - Inter-disciplinary family meet or Palliative Care meeting due by: 06/26/17   GLOBAL Move to tele 06/20/2017 and TRH primary from 06/21/17 and ccm off - paged [facility]. MuraBrand MalesD., F.C.[facility] Pulmonary and Critical Care Medicine Staff Physician [facility] Pager: [phone] no answer or between  15:00h - 7:00h: call [phone]  06/20/2017 12:19 PM

## 2017-06-21 LAB — CBC WITH DIFFERENTIAL/PLATELET
Basophils Absolute: 0 10*3/uL (ref 0.0–0.1)
Basophils Relative: 0 %
EOS PCT: 3 %
Eosinophils Absolute: 0.2 10*3/uL (ref 0.0–0.7)
HEMATOCRIT: 26.8 % — AB (ref 36.0–46.0)
Hemoglobin: 8.3 g/dL — ABNORMAL LOW (ref 12.0–15.0)
LYMPHS ABS: 1 10*3/uL (ref 0.7–4.0)
LYMPHS PCT: 17 %
MCH: 26.1 pg (ref 26.0–34.0)
MCHC: 31 g/dL (ref 30.0–36.0)
MCV: 84.3 fL (ref 78.0–100.0)
MONO ABS: 0.4 10*3/uL (ref 0.1–1.0)
Monocytes Relative: 7 %
Neutro Abs: 4.2 10*3/uL (ref 1.7–7.7)
Neutrophils Relative %: 73 %
PLATELETS: 176 10*3/uL (ref 150–400)
RBC: 3.18 MIL/uL — ABNORMAL LOW (ref 3.87–5.11)
RDW: 17.8 % — AB (ref 11.5–15.5)
WBC: 5.8 10*3/uL (ref 4.0–10.5)

## 2017-06-21 LAB — HEPATIC FUNCTION PANEL
ALBUMIN: 2.5 g/dL — AB (ref 3.5–5.0)
ALK PHOS: 34 U/L — AB (ref 38–126)
ALT: 116 U/L — AB (ref 14–54)
AST: 39 U/L (ref 15–41)
Bilirubin, Direct: <0.1 mg/dL — ABNORMAL LOW (ref 0.1–0.5)
TOTAL PROTEIN: 5.1 g/dL — AB (ref 6.5–8.1)
Total Bilirubin: 0.6 mg/dL (ref 0.3–1.2)

## 2017-06-21 LAB — BASIC METABOLIC PANEL
Anion gap: 12 (ref 5–15)
BUN: 89 mg/dL — AB (ref 6–20)
CALCIUM: 8.3 mg/dL — AB (ref 8.9–10.3)
CO2: 16 mmol/L — AB (ref 22–32)
Chloride: 101 mmol/L (ref 101–111)
Creatinine, Ser: 8.53 mg/dL — ABNORMAL HIGH (ref 0.44–1.00)
GFR calc Af Amer: 6 mL/min — ABNORMAL LOW (ref >60)
GFR, EST NON AFRICAN AMERICAN: 5 mL/min — AB (ref >60)
GLUCOSE: 123 mg/dL — AB (ref 65–99)
Potassium: 4 mmol/L (ref 3.5–5.1)
Sodium: 129 mmol/L — ABNORMAL LOW (ref 135–145)

## 2017-06-21 MED ORDER — SODIUM BICARBONATE 650 MG PO TABS
1300.0000 mg | ORAL_TABLET | Freq: Two times a day (BID) | ORAL | Status: DC
  Administered 2017-06-21 – 2017-06-23 (×6): 1300 mg via ORAL
  Filled 2017-06-21 (×6): qty 2

## 2017-06-21 NOTE — Progress Notes (Signed)
Iv leaking retightened connections. Still leaking. Leaking at hub. Taken out. IV team called. Warm pack applied to site.

## 2017-06-21 NOTE — Progress Notes (Signed)
PROGRESS NOTE    Madeline Brown  BMW:[id number] DOB: 1975/09/04 DOA: 06/18/2017 PCP: Patient, No Pcp Per  Outpatient Specialists:   Brief Narrative: Patient is a 42 yo African American Female, poor historian, with past medical history significant for HTN, CKD IV, and medication non adeherance. Patient was admitted with epistaxis, accelerated HTN, pulmonary edema and AKI on CKD 4. BP on presentation was 220/140. Patient was initially admitted to the ICU team, and transferred to the Hospitalist service today. Nephrology is following patient. Pulmonary edema has improved with diuresis, but there is worsening renal function. BP is better controlled. No further nose bleed reported.  No fever or chills, no SOB, no chest pain, no hematemesis.   Assessment & Plan:   Active Problems:   Hypertensive emergency EPistaxix AKI on CKD 4 Pulmonary edema History of Non compliance  Continue to assess need for HD Optimize BP   DVT prophylaxis: SCD Code Status: Full Family Communication:  Disposition Plan: Home eventually  Consultants:   Nephrology  Procedures:   None  Antimicrobials:   None    Subjective: No new complaints. No SOB. No nose bleed.  Objective: Vitals:   06/21/17 1000 06/21/17 1100 06/21/17 1200 06/21/17 1230  BP: 137/90 135/87 (!) 150/95 (!) 148/96  Pulse:      Resp: 18 18 15  (!) 22  Temp:      TempSrc:      SpO2:      Weight:      Height:        Intake/Output Summary (Last 24 hours) at 06/21/2017 1542 Last data filed at 06/21/2017 0900 Gross per 24 hour  Intake 472 ml  Output 1850 ml  Net -1378 ml   Filed Weights   06/19/17 0415 06/20/17 0500 06/21/17 0500  Weight: 78.7 kg (173 lb 8 oz) 78.2 kg (172 lb 6.4 oz) 78.9 kg (174 lb)    Examination:  General exam: Appears calm and comfortable  HEENT - Pallor Respiratory system: Clear to auscultation. Respiratory effort normal. Cardiovascular system: S1 & S2 heard  Gastrointestinal system: Abdomen is  nondistended, soft and nontender. No organomegaly or masses felt. Normal bowel sounds heard. Central nervous system: Alert and oriented. No focal neurological deficits. Extremities: Symmetric 5 x 5 power. No leg edema   Data Reviewed: I have personally reviewed following labs and imaging studies  CBC: Recent Labs  Lab 06/18/17 2213 06/19/17 0045 06/19/17 0803 06/20/17 1103 06/21/17 0321  WBC  --  5.1 5.0 5.2 5.8  NEUTROABS  --  3.4  --   --  4.2  HGB 8.8* 9.8* 9.1* 9.5* 8.3*  HCT 26.0* 30.2* 29.0* 30.6* 26.8*  MCV  --  83.4 83.1 84.8 84.3  PLT  --  124* 164 163 272   Basic Metabolic Panel: Recent Labs  Lab 06/18/17 2213 06/19/17 0045 06/19/17 0803 06/20/17 1103 06/21/17 0000  NA 136 133* 131* 131* 129*  K 3.9 3.7 3.8 4.0 4.0  CL 107 106 105 105 101  CO2  --  15* 16* 15* 16*  GLUCOSE 122* 110* 136* 167* 123*  BUN 73* 79* 82* 81* 89*  CREATININE 6.90* 6.54* 6.75* 7.81* 8.53*  CALCIUM  --  8.3* 8.3* 8.1* 8.3*  MG  --   --  1.9  --   --   PHOS  --   --  4.2  --   --    GFR: Estimated Creatinine Clearance: 8.2 mL/min (A) (by C-G formula based on SCr of 8.53 mg/dL (H)). Liver  Function Tests: Recent Labs  Lab 06/19/17 0045 06/21/17 0321  AST 83* 39  ALT 155* 116*  ALKPHOS 46 34*  BILITOT 0.9 0.6  PROT 6.0* 5.1*  ALBUMIN 2.9* 2.5*   No results for input(s): LIPASE, AMYLASE in the last 168 hours. No results for input(s): AMMONIA in the last 168 hours. Coagulation Profile: Recent Labs  Lab 06/19/17 0045  INR 1.07   Cardiac Enzymes: Recent Labs  Lab 06/19/17 0045  TROPONINI <0.03   BNP (last 3 results) No results for input(s): PROBNP in the last 8760 hours. HbA1C: No results for input(s): HGBA1C in the last 72 hours. CBG: No results for input(s): GLUCAP in the last 168 hours. Lipid Profile: No results for input(s): CHOL, HDL, LDLCALC, TRIG, CHOLHDL, LDLDIRECT in the last 72 hours. Thyroid Function Tests: No results for input(s): TSH, T4TOTAL, FREET4,  T3FREE, THYROIDAB in the last 72 hours. Anemia Panel: No results for input(s): VITAMINB12, FOLATE, FERRITIN, TIBC, IRON, RETICCTPCT in the last 72 hours. Urine analysis:    Component Value Date/Time   COLORURINE ORANGE (A) 02/13/2017 0827   APPEARANCEUR HAZY (A) 02/13/2017 0827   LABSPEC 1.020 02/13/2017 0827   PHURINE 6.0 02/13/2017 0827   GLUCOSEU NEGATIVE 02/13/2017 0827   HGBUR 3 (A) 02/13/2017 0827   BILIRUBINUR NEGATIVE 02/13/2017 0827   KETONESUR NEGATIVE 02/13/2017 0827   PROTEINUR >300 (A) 02/13/2017 0827   UROBILINOGEN 0.2 06/17/2016 1257   NITRITE NEGATIVE 02/13/2017 0827   LEUKOCYTESUR TRACE (A) 02/13/2017 0827   Sepsis Labs: @LABRCNTIP (procalcitonin:4,lacticidven:4)  ) Recent Results (from the past 240 hour(s))  MRSA PCR Screening     Status: None   Collection Time: 06/19/17 12:39 PM  Result Value Ref Range Status   MRSA by PCR NEGATIVE NEGATIVE Final    Comment:        The GeneXpert MRSA Assay (FDA approved for NASAL specimens only), is one component of a comprehensive MRSA colonization surveillance program. It is not intended to diagnose MRSA infection nor to guide or monitor treatment for MRSA infections.          Radiology Studies: Dg Chest Port 1 View  Result Date: 06/20/2017 CLINICAL DATA:  Respiratory failure. EXAM: PORTABLE CHEST 1 VIEW COMPARISON:  06/18/2017. FINDINGS: The heart is enlarged. The lung fields are clear. There is no bony abnormality. Marked improvement in BILATERAL lung opacity, likely resolving pulmonary edema compared with radiographic 2 days earlier. IMPRESSION: Improved aeration, presumed clearing edema. Electronically Signed   By: Staci Righter M.D.   On: 06/20/2017 09:04        Scheduled Meds: . carvedilol  25 mg Oral BID WC  . cloNIDine  0.2 mg Oral Q8H  . hydrALAZINE  100 mg Oral Q8H  . NIFEdipine  90 mg Oral QHS  . pantoprazole  40 mg Oral Q1200  . sodium bicarbonate  1,300 mg Oral BID   Continuous  Infusions: . sodium chloride    . furosemide 160 mg (06/21/17 1450)  . niCARDipine Stopped (06/20/17 0715)     LOS: 2 days    Time spent: [place], MD  [facility] Pager #: [phone] 7PM-7AM contact night coverage as above

## 2017-06-21 NOTE — Plan of Care (Signed)
Discussed diet, Hypertension and heart work and medications. Patient concerned about status of medicaid. Case management consult for check and medication assistance.

## 2017-06-21 NOTE — Progress Notes (Signed)
Admit: 06/18/2017 LOS: 2  21M with hx/o Malignant HTN with TMA admitted with HTN emergency and AoCKD3  Subjective:  Good UOP with lasix SCr up to 8.5;   BPs largely normal No anorexia, N/V, dysgeusia  01/05 0701 - 01/06 0700 In: 862 [P.O.:480; I.V.:250; IV Piggyback:132] Out: 1850 [Urine:1850]  Filed Weights   06/19/17 0415 06/20/17 0500 06/21/17 0500  Weight: 78.7 kg (173 lb 8 oz) 78.2 kg (172 lb 6.4 oz) 78.9 kg (174 lb)    Scheduled Meds: . carvedilol  25 mg Oral BID WC  . cloNIDine  0.2 mg Oral Q8H  . hydrALAZINE  100 mg Oral Q8H  . NIFEdipine  90 mg Oral QHS  . pantoprazole  40 mg Oral Q1200  . sodium bicarbonate  650 mg Oral TID   Continuous Infusions: . sodium chloride    . furosemide Stopped (06/21/17 0456)  . niCARDipine Stopped (06/20/17 0715)   PRN Meds:.sodium chloride, acetaminophen, Influenza vac split quadrivalent PF, labetalol, pneumococcal 23 valent vaccine  Current Labs: reviewed    Physical Exam:  Blood pressure 121/80, pulse 76, temperature 99.8 F (37.7 C), temperature source Oral, resp. rate 15, height 5' (1.524 m), weight 78.9 kg (174 lb), last menstrual period 06/20/2017, SpO2 96 %, unknown if currently breastfeeding. GEN: NAD, obese, ENT: NCAT EYES: EOMI CV: nl s1s2, no murmur or rub, regular PULM: CTAB ABD: s/nt/nd SKIN: no rashes/lesions EXT:2+ LEE b/l No asterixus, nonfocal, AAOx3  A 1. 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 BL SCr 2.17 03/09/17; sees Patel at [facility]  2. Not uremic; but very low GFR; discussed might need HD during this admission 3. Renal US last year with increased echogenicity and R 74mm stone, don't think needs repeating w/o renal colic and adequate UOP 2. Uncontrolled HTN with HTN emergency 1. Improved with restart of outpt regimen, holding ACEi 2. Hypervoelmic on exam, needs diuretsis 3. Nonadherence to BP meds as outpt, partially from cost   P 1. NO change to BP regimen 2. Change NaHCO3 to 1300 BID 3. Not uremic, K ok, no  HD needs, monitor closely; maybe will recover w/ BP improved 4. She knows might need HD during this admission   Pearson Grippe MD 06/21/2017, 8:21 AM  Recent Labs  Lab 06/19/17 0803 06/20/17 1103 06/21/17 0000  NA 131* 131* 129*  K 3.8 4.0 4.0  CL 105 105 101  CO2 16* 15* 16*  GLUCOSE 136* 167* 123*  BUN 82* 81* 89*  CREATININE 6.75* 7.81* 8.53*  CALCIUM 8.3* 8.1* 8.3*  PHOS 4.2  --   --    Recent Labs  Lab 06/19/17 0045 06/19/17 0803 06/20/17 1103 06/21/17 0321  WBC 5.1 5.0 5.2 5.8  NEUTROABS 3.4  --   --  4.2  HGB 9.8* 9.1* 9.5* 8.3*  HCT 30.2* 29.0* 30.6* 26.8*  MCV 83.4 83.1 84.8 84.3  PLT 124* 164 163 176

## 2017-06-22 ENCOUNTER — Ambulatory Visit (HOSPITAL_COMMUNITY): Payer: Medicaid Other

## 2017-06-22 DIAGNOSIS — N185 Chronic kidney disease, stage 5: Secondary | ICD-10-CM

## 2017-06-22 DIAGNOSIS — I10 Essential (primary) hypertension: Secondary | ICD-10-CM

## 2017-06-22 LAB — CBC WITH DIFFERENTIAL/PLATELET
BASOS ABS: 0 10*3/uL (ref 0.0–0.1)
BASOS PCT: 0 %
EOS ABS: 0.1 10*3/uL (ref 0.0–0.7)
Eosinophils Relative: 3 %
HEMATOCRIT: 27.2 % — AB (ref 36.0–46.0)
HEMOGLOBIN: 8.7 g/dL — AB (ref 12.0–15.0)
Lymphocytes Relative: 19 %
Lymphs Abs: 0.9 10*3/uL (ref 0.7–4.0)
MCH: 26.9 pg (ref 26.0–34.0)
MCHC: 32 g/dL (ref 30.0–36.0)
MCV: 84.2 fL (ref 78.0–100.0)
Monocytes Absolute: 0.4 10*3/uL (ref 0.1–1.0)
Monocytes Relative: 8 %
NEUTROS ABS: 3.1 10*3/uL (ref 1.7–7.7)
NEUTROS PCT: 70 %
Platelets: 163 10*3/uL (ref 150–400)
RBC: 3.23 MIL/uL — AB (ref 3.87–5.11)
RDW: 17.8 % — ABNORMAL HIGH (ref 11.5–15.5)
WBC: 4.5 10*3/uL (ref 4.0–10.5)

## 2017-06-22 MED ORDER — FUROSEMIDE 80 MG PO TABS
80.0000 mg | ORAL_TABLET | Freq: Every day | ORAL | Status: DC
  Administered 2017-06-22 – 2017-06-23 (×2): 80 mg via ORAL
  Filled 2017-06-22 (×2): qty 1

## 2017-06-22 NOTE — Progress Notes (Signed)
[facility] ROUNDING NOTE   Subjective:  31M with hx/o Malignant HTN with TMA admitted with HTN emergency and AoCKD3   Objective:  Vital signs in last 24 hours:  Temp:  [97.9 F (36.6 C)-99 F (37.2 C)] 98.3 F (36.8 C) (01/07 0849) Pulse Rate:  [79-87] 87 (01/07 0400) Resp:  [15-22] 16 (01/07 0800) BP: (134-150)/(78-96) 137/87 (01/07 0800) SpO2:  [98 %-99 %] 99 % (01/07 0745) Weight:  [173 lb 8 oz (78.7 kg)] 173 lb 8 oz (78.7 kg) (01/07 0500)  Weight change: -8 oz (-0.226 kg) Filed Weights   06/20/17 0500 06/21/17 0500 06/22/17 0500  Weight: 172 lb 6.4 oz (78.2 kg) 174 lb (78.9 kg) 173 lb 8 oz (78.7 kg)    Intake/Output: I/O last 3 completed shifts: In: 1192 [P.O.:940; I.V.:120; IV Piggyback:132] Out: 3250 [Urine:2800; Stool:450]   Intake/Output this shift:  Total I/O In: -  Out: 350 [Urine:350]  CVS- RRR RS- CTA ABD- BS present soft non-distended EXT- no edema   Basic Metabolic Panel: Recent Labs  Lab 06/18/17 2213  06/19/17 0045 06/19/17 0803 06/20/17 1103 06/21/17 0000  NA 136  --  133* 131* 131* 129*  K 3.9  --  3.7 3.8 4.0 4.0  CL 107  --  106 105 105 101  CO2  --   --  15* 16* 15* 16*  GLUCOSE 122*  --  110* 136* 167* 123*  BUN 73*  --  79* 82* 81* 89*  CREATININE 6.90*  --  6.54* 6.75* 7.81* 8.53*  CALCIUM  --    < > 8.3* 8.3* 8.1* 8.3*  MG  --   --   --  1.9  --   --   PHOS  --   --   --  4.2  --   --    < > = values in this interval not displayed.    Liver Function Tests: Recent Labs  Lab 06/19/17 0045 06/21/17 0321  AST 83* 39  ALT 155* 116*  ALKPHOS 46 34*  BILITOT 0.9 0.6  PROT 6.0* 5.1*  ALBUMIN 2.9* 2.5*   No results for input(s): LIPASE, AMYLASE in the last 168 hours. No results for input(s): AMMONIA in the last 168 hours.  CBC: Recent Labs  Lab 06/19/17 0045 06/19/17 0803 06/20/17 1103 06/21/17 0321 06/22/17 0319  WBC 5.1 5.0 5.2 5.8 4.5  NEUTROABS 3.4  --   --  4.2 3.1  HGB 9.8* 9.1* 9.5* 8.3* 8.7*   HCT 30.2* 29.0* 30.6* 26.8* 27.2*  MCV 83.4 83.1 84.8 84.3 84.2  PLT 124* 164 163 176 163    Cardiac Enzymes: Recent Labs  Lab 06/19/17 0045  TROPONINI <0.03    BNP: Invalid input(s): POCBNP  CBG: No results for input(s): GLUCAP in the last 168 hours.  Microbiology: Results for orders placed or performed during the hospital encounter of 06/18/17  MRSA PCR Screening     Status: None   Collection Time: 06/19/17 12:39 PM  Result Value Ref Range Status   MRSA by PCR NEGATIVE NEGATIVE Final    Comment:        The GeneXpert MRSA Assay (FDA approved for NASAL specimens only), is one component of a comprehensive MRSA colonization surveillance program. It is not intended to diagnose MRSA infection nor to guide or monitor treatment for MRSA infections.     Coagulation Studies: No results for input(s): LABPROT, INR in the last 72 hours.  Urinalysis: No results for input(s): COLORURINE, LABSPEC, [place],  GLUCOSEU, HGBUR, BILIRUBINUR, KETONESUR, PROTEINUR, UROBILINOGEN, NITRITE, LEUKOCYTESUR in the last 72 hours.  Invalid input(s): APPERANCEUR    Imaging: No results found.   Medications:   . sodium chloride    . niCARDipine Stopped (06/20/17 0715)   . carvedilol  25 mg Oral BID WC  . cloNIDine  0.2 mg Oral Q8H  . furosemide  80 mg Oral Daily  . hydrALAZINE  100 mg Oral Q8H  . NIFEdipine  90 mg Oral QHS  . pantoprazole  40 mg Oral Q1200  . sodium bicarbonate  1,300 mg Oral BID   sodium chloride, acetaminophen, Influenza vac split quadrivalent PF, labetalol, pneumococcal 23 valent vaccine  Assessment/ Plan:  1. 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 BL SCr 2.17 03/09/17; sees Patel at [facility]   Will need vein mapping would also have VVS evaluate for permanent access  2. Not uremic; but very low GFR; discussed might need HD during this admission interested in PD  3. Renal US last year with increased echogenicity and R 57mm stone, don't think needs repeating w/o renal colic and  adequate UOP 2. Uncontrolled HTN with HTN emergency 1. Improved with restart of outpt regimen, holding ACEi 2. Hypervoelmic on exam, needs diuretsis 3. Nonadherence to BP meds as outpt, partially from cost  P 1. NO change to BP regimen   Will change IV lasix to oral 80mg  daily 2. Change NaHCO3 to 1300 BID 3. Not uremic, K ok, no HD needs, monitor closely; maybe will recover w/ BP improved 4. She knows might need HD during this admission     LOS: 3 Madeline Brown W @TODAY @10 :00 AM

## 2017-06-22 NOTE — Consult Note (Signed)
Vascular and Vein Specialist of [facility]  Patient name: Madeline Brown MRN: [id number] DOB: [birth year] Sex: female    HPI: Madeline Brown is a 42 y.o. female seen in consultation for hemodialysis access.  She was admitted with malignant hypertension and was found to have creatinine elevated to 8.  She has known baseline renal 2.  She remains hemodynamically stable currently in the intensive care unit.  She has never had any hemodialysis access.  She does not have any central catheters.  She is right-handed.  Past Medical History:  Diagnosis Date  . Hypertension     Family History  Problem Relation Age of Onset  . Diabetes Father   . Hypertension Father   . Cancer Father     SOCIAL HISTORY: Social History   Tobacco Use  . Smoking status: Current Every Day Smoker    Packs/day: 0.25    Years: 15.00    Pack years: 3.75    Types: Cigarettes  . Smokeless tobacco: Never Used  Substance Use Topics  . Alcohol use: Yes    Comment: occassional beer    Allergies  Allergen Reactions  . Visine [Tetrahydrozoline Hcl] Other (See Comments)    Eyes Swelling.    Current Facility-Administered Medications  Medication Dose Route Frequency Provider Last Rate Last Dose  . 0.9 %  sodium chloride infusion  250 mL Intravenous PRN Desai, Rahul P, PA-C      . acetaminophen (TYLENOL) tablet 650 mg  650 mg Oral Q8H PRN Collene Gobble, MD   650 mg at 06/20/17 0114  . carvedilol (COREG) tablet 25 mg  25 mg Oral BID WC Desai, Rahul P, PA-C   25 mg at 06/22/17 1037  . cloNIDine (CATAPRES) tablet 0.2 mg  0.2 mg Oral Q8H Desai, Rahul P, PA-C   0.2 mg at 06/22/17 1326  . furosemide (LASIX) tablet 80 mg  80 mg Oral Daily Edrick Oh, MD   80 mg at 06/22/17 1328  . hydrALAZINE (APRESOLINE) tablet 100 mg  100 mg Oral Q8H Desai, Rahul P, PA-C   100 mg at 06/22/17 1326  . Influenza vac split quadrivalent PF (FLUARIX) injection 0.5 mL  0.5 mL Intramuscular Prior to  discharge Collene Gobble, MD      . labetalol (NORMODYNE,TRANDATE) injection 10 mg  10 mg Intravenous Q6H PRN Collene Gobble, MD   10 mg at 06/19/17 2013  . nicardipine (CARDENE) 40mg  in 0.83% saline 270ml IV DOUBLE STRENGTH infusion (0.2 mg/ml)  3-15 mg/hr Intravenous Continuous Collene Gobble, MD   Stopped at 06/20/17 0715  . NIFEdipine (PROCARDIA XL/ADALAT-CC) 24 hr tablet 90 mg  90 mg Oral QHS Desai, Rahul P, PA-C   90 mg at 06/21/17 2101  . pantoprazole (PROTONIX) EC tablet 40 mg  40 mg Oral Q1200 Brand Males, MD   40 mg at 06/22/17 1257  . pneumococcal 23 valent vaccine (PNU-IMMUNE) injection 0.5 mL  0.5 mL Intramuscular Prior to discharge Collene Gobble, MD      . sodium bicarbonate tablet 1,300 mg  1,300 mg Oral BID Pearson Grippe B, MD   1,300 mg at 06/22/17 1037    REVIEW OF SYSTEMS:  [X]  denotes positive finding, [ ]  denotes negative finding Cardiac  Comments:  Chest pain or chest pressure:    Shortness of breath upon exertion:    Short of breath when lying flat:    Irregular heart rhythm:        Vascular    Pain in  calf, thigh, or hip brought on by ambulation:    Pain in feet at night that wakes you up from your sleep:     Blood clot in your veins:    Leg swelling:           PHYSICAL EXAM: Vitals:   06/22/17 0745 06/22/17 0800 06/22/17 0849 06/22/17 1210  BP:  137/87    Pulse:      Resp:  16    Temp:   98.3 F (36.8 C) (!) 97.4 F (36.3 C)  TempSrc:   Oral Oral  SpO2: 99%     Weight:      Height:        GENERAL: The patient is a well-nourished female, in no acute distress. The vital signs are documented above. CARDIOVASCULAR: 2+ dorsalis pedis pulses bilaterally.  Moderate cephalic vein in the left antecubital space PULMONARY: There is good air exchange  MUSCULOSKELETAL: There are no major deformities or cyanosis. NEUROLOGIC: No focal weakness or paresthesias are detected. SKIN: There are no ulcers or rashes noted. PSYCHIATRIC: The patient has a  normal affect.  DATA:  Vein map pending  MEDICAL ISSUES: I had a long discussion with the patient regarding options for hemodialysis.  She apparently has had some consideration of peritoneal dialysis as well.  Currently does not have any indication for acute hemodialysis.  I discussed options for tunneled catheter, AV graft and AV fistulas.  Determine her best access option.  Would recommend placement of a fistula during this admission if she is felt to be a fistula candidate.  From a physical exam standpoint it appears that her left antecubital vein is adequate for fistula creation.  We will continue to follow with you.  Potentially planning surgery for Wednesday, January 9    Rosetta Posner, MD [facility] Vascular and Vein Specialists of [facility] Tel [phone] Pager [phone]

## 2017-06-22 NOTE — Plan of Care (Signed)
  Clinical Measurements: Respiratory complications will improve 06/22/2017 0805 - Progressing by Jenne Campus, RN  Pt on room air sats >93% Elimination: Will not experience complications related to bowel motility 06/22/2017 0805 - Progressing by Jenne Campus, RN  No issues Elimination: Will not experience complications related to urinary retention 06/22/2017 0805 - Progressing by Jenne Campus, RN  Voiding using BSC

## 2017-06-22 NOTE — Progress Notes (Signed)
Pt transferred to room 5M05 from [facility] via wheelchair. Pt is alert and oriented x 4. Pt is placed on telemetry per order and oriented to room and staff. Dorthey Sawyer, RN

## 2017-06-22 NOTE — Progress Notes (Signed)
PROGRESS NOTE  Madeline Brown  JOA:[id number] DOB: 1975/11/10 DOA: 06/18/2017 PCP: Patient, No Pcp Per   Brief Narrative: Madeline Brown is a 42 y.o. female with a history of untreated HTN, stage III CKD who presented with epistaxis, HTN emergency with pulmonary edema and AKI. BP was 220/140, initially admitted to ICU 1/3, transferred to hospitalist service 1/6. Renal function has worsened with diuresis, though BP is better controlled.   Assessment & Plan: Active Problems:   Hypertensive emergency  Hypertensive emergency and uncontrolled HTN: Improving with volume removal.  - Continue outpatient regimen w/coreg, clonidine, hydralazine, nicardipine; holding ACE as below  Acute renal failure on stage III CKD:  - Considering PD vs. HD which may be required during this admission.  - Appreciate nephrology consultation, converting lasix to 80mg  po daily. Follow UOP.  - Monitoring renal function daily - Tentatively planning left AVF per Dr. Donnetta Hutching 1/9.  - Avoid nephrotoxins including ACE inhibitors.   Pulmonary edema: Resolved. Repeat CXR 1/5 w/cleared edema, not hypoxic.   Metabolic acidosis: Due to renal disease.  - Po bicarb ordered  Normocytic anemia: Likely due to CKD.  - Ferritin low-normal, may benefit from iron supplement. Defer to nephrology - Monitor hgb.   DVT prophylaxis: SCDs Code Status: Full Family Communication: None at bedside Disposition Plan: Home  Consultants:   Nephrology  Vascular surgery  Procedures:   None  Antimicrobials:  None   Subjective: Feels better, ambulating, dyspnea better. No chest pain. Making urine.   Objective: Vitals:   06/22/17 1000 06/22/17 1200 06/22/17 1210 06/22/17 1400  BP: 111/77 126/83  131/85  Pulse:      Resp: 16 18  (!) 25  Temp:   (!) 97.4 F (36.3 C)   TempSrc:   Oral   SpO2:  99%    Weight:      Height:        Intake/Output Summary (Last 24 hours) at 06/22/2017 1548 Last data filed at 06/22/2017 0900 Gross per  24 hour  Intake 960 ml  Output 2500 ml  Net -1540 ml   Filed Weights   06/20/17 0500 06/21/17 0500 06/22/17 0500  Weight: 78.2 kg (172 lb 6.4 oz) 78.9 kg (174 lb) 78.7 kg (173 lb 8 oz)    Gen: 42 y.o. female in no distress  Pulm: Non-labored breathing room air. Clear to auscultation bilaterally.  CV: Regular rate and rhythm. No murmur, rub, or gallop. No JVD, no pedal edema. GI: Abdomen soft, non-tender, non-distended, with normoactive bowel sounds. No organomegaly or masses felt. Ext: Warm, no deformities Skin: No rashes, lesions no ulcers Neuro: Alert and oriented. No focal neurological deficits. Psych: Judgement and insight appear normal. Mood & affect appropriate.   Data Reviewed: I have personally reviewed following labs and imaging studies  CBC: Recent Labs  Lab 06/19/17 0045 06/19/17 0803 06/20/17 1103 06/21/17 0321 06/22/17 0319  WBC 5.1 5.0 5.2 5.8 4.5  NEUTROABS 3.4  --   --  4.2 3.1  HGB 9.8* 9.1* 9.5* 8.3* 8.7*  HCT 30.2* 29.0* 30.6* 26.8* 27.2*  MCV 83.4 83.1 84.8 84.3 84.2  PLT 124* 164 163 176 601   Basic Metabolic Panel: Recent Labs  Lab 06/18/17 2213 06/19/17 0045 06/19/17 0803 06/20/17 1103 06/21/17 0000  NA 136 133* 131* 131* 129*  K 3.9 3.7 3.8 4.0 4.0  CL 107 106 105 105 101  CO2  --  15* 16* 15* 16*  GLUCOSE 122* 110* 136* 167* 123*  BUN 73* 79* 82* 81*  89*  CREATININE 6.90* 6.54* 6.75* 7.81* 8.53*  CALCIUM  --  8.3* 8.3* 8.1* 8.3*  MG  --   --  1.9  --   --   PHOS  --   --  4.2  --   --    GFR: Estimated Creatinine Clearance: 8.1 mL/min (A) (by C-G formula based on SCr of 8.53 mg/dL (H)). Liver Function Tests: Recent Labs  Lab 06/19/17 0045 06/21/17 0321  AST 83* 39  ALT 155* 116*  ALKPHOS 46 34*  BILITOT 0.9 0.6  PROT 6.0* 5.1*  ALBUMIN 2.9* 2.5*   No results for input(s): LIPASE, AMYLASE in the last 168 hours. No results for input(s): AMMONIA in the last 168 hours. Coagulation Profile: Recent Labs  Lab 06/19/17 0045    INR 1.07   Cardiac Enzymes: Recent Labs  Lab 06/19/17 0045  TROPONINI <0.03   BNP (last 3 results) No results for input(s): PROBNP in the last 8760 hours. HbA1C: No results for input(s): HGBA1C in the last 72 hours. CBG: No results for input(s): GLUCAP in the last 168 hours. Lipid Profile: No results for input(s): CHOL, HDL, LDLCALC, TRIG, CHOLHDL, LDLDIRECT in the last 72 hours. Thyroid Function Tests: No results for input(s): TSH, T4TOTAL, FREET4, T3FREE, THYROIDAB in the last 72 hours. Anemia Panel: No results for input(s): VITAMINB12, FOLATE, FERRITIN, TIBC, IRON, RETICCTPCT in the last 72 hours. Urine analysis:    Component Value Date/Time   COLORURINE ORANGE (A) 02/13/2017 0827   APPEARANCEUR HAZY (A) 02/13/2017 0827   LABSPEC 1.020 02/13/2017 0827   PHURINE 6.0 02/13/2017 0827   GLUCOSEU NEGATIVE 02/13/2017 0827   HGBUR 3 (A) 02/13/2017 0827   BILIRUBINUR NEGATIVE 02/13/2017 0827   KETONESUR NEGATIVE 02/13/2017 0827   PROTEINUR >300 (A) 02/13/2017 0827   UROBILINOGEN 0.2 06/17/2016 1257   NITRITE NEGATIVE 02/13/2017 0827   LEUKOCYTESUR TRACE (A) 02/13/2017 0827   Recent Results (from the past 240 hour(s))  MRSA PCR Screening     Status: None   Collection Time: 06/19/17 12:39 PM  Result Value Ref Range Status   MRSA by PCR NEGATIVE NEGATIVE Final    Comment:        The GeneXpert MRSA Assay (FDA approved for NASAL specimens only), is one component of a comprehensive MRSA colonization surveillance program. It is not intended to diagnose MRSA infection nor to guide or monitor treatment for MRSA infections.       Radiology Studies: No results found.  Scheduled Meds: . carvedilol  25 mg Oral BID WC  . cloNIDine  0.2 mg Oral Q8H  . furosemide  80 mg Oral Daily  . hydrALAZINE  100 mg Oral Q8H  . NIFEdipine  90 mg Oral QHS  . pantoprazole  40 mg Oral Q1200  . sodium bicarbonate  1,300 mg Oral BID   Continuous Infusions: . sodium chloride    .  niCARDipine Stopped (06/20/17 0715)     LOS: 3 days   Time spent: 35 minutes.  Vance Gather, MD [facility] Pager [phone]  If 7PM-7AM, please contact night-coverage [email or web address] Password [facility] 06/22/2017, 3:48 PM

## 2017-06-23 ENCOUNTER — Inpatient Hospital Stay (HOSPITAL_COMMUNITY): Payer: Medicaid Other

## 2017-06-23 DIAGNOSIS — N189 Chronic kidney disease, unspecified: Secondary | ICD-10-CM

## 2017-06-23 LAB — RENAL FUNCTION PANEL
ANION GAP: 15 (ref 5–15)
Albumin: 2.8 g/dL — ABNORMAL LOW (ref 3.5–5.0)
BUN: 94 mg/dL — ABNORMAL HIGH (ref 6–20)
CALCIUM: 8.6 mg/dL — AB (ref 8.9–10.3)
CHLORIDE: 100 mmol/L — AB (ref 101–111)
CO2: 18 mmol/L — AB (ref 22–32)
CREATININE: 9.64 mg/dL — AB (ref 0.44–1.00)
GFR, EST AFRICAN AMERICAN: 5 mL/min — AB (ref >60)
GFR, EST NON AFRICAN AMERICAN: 4 mL/min — AB (ref >60)
Glucose, Bld: 121 mg/dL — ABNORMAL HIGH (ref 65–99)
Phosphorus: 7.6 mg/dL — ABNORMAL HIGH (ref 2.5–4.6)
Potassium: 4 mmol/L (ref 3.5–5.1)
Sodium: 133 mmol/L — ABNORMAL LOW (ref 135–145)

## 2017-06-23 LAB — CBC WITH DIFFERENTIAL/PLATELET
BASOS ABS: 0 10*3/uL (ref 0.0–0.1)
Basophils Relative: 0 %
EOS PCT: 4 %
Eosinophils Absolute: 0.1 10*3/uL (ref 0.0–0.7)
HCT: 27.1 % — ABNORMAL LOW (ref 36.0–46.0)
HEMOGLOBIN: 8.4 g/dL — AB (ref 12.0–15.0)
LYMPHS ABS: 0.6 10*3/uL — AB (ref 0.7–4.0)
LYMPHS PCT: 17 %
MCH: 26.1 pg (ref 26.0–34.0)
MCHC: 31 g/dL (ref 30.0–36.0)
MCV: 84.2 fL (ref 78.0–100.0)
Monocytes Absolute: 0.4 10*3/uL (ref 0.1–1.0)
Monocytes Relative: 12 %
NEUTROS ABS: 2.5 10*3/uL (ref 1.7–7.7)
NEUTROS PCT: 67 %
PLATELETS: 182 10*3/uL (ref 150–400)
RBC: 3.22 MIL/uL — AB (ref 3.87–5.11)
RDW: 17.2 % — ABNORMAL HIGH (ref 11.5–15.5)
WBC: 3.8 10*3/uL — AB (ref 4.0–10.5)

## 2017-06-23 MED ORDER — SIMETHICONE 80 MG PO CHEW
80.0000 mg | CHEWABLE_TABLET | Freq: Four times a day (QID) | ORAL | Status: DC | PRN
  Filled 2017-06-23: qty 1

## 2017-06-23 MED ORDER — SODIUM CHLORIDE 0.9 % IV SOLN
125.0000 mg | Freq: Every day | INTRAVENOUS | Status: DC
  Administered 2017-06-23 – 2017-06-27 (×4): 125 mg via INTRAVENOUS
  Filled 2017-06-23 (×9): qty 10

## 2017-06-23 MED ORDER — CEFAZOLIN SODIUM-DEXTROSE 1-4 GM/50ML-% IV SOLN
1.0000 g | INTRAVENOUS | Status: AC
  Administered 2017-06-24: 1 g via INTRAVENOUS
  Filled 2017-06-23: qty 50

## 2017-06-23 MED ORDER — DARBEPOETIN ALFA 60 MCG/0.3ML IJ SOSY
60.0000 ug | PREFILLED_SYRINGE | INTRAMUSCULAR | Status: DC
  Administered 2017-06-23: 60 ug via INTRAVENOUS
  Filled 2017-06-23: qty 0.3

## 2017-06-23 NOTE — Care Management Note (Signed)
Case Management Note  Patient Details  Name: Lorinda Copland MRN: [id number] Date of Birth: 10-Aug-1975  Subjective/Objective:   CKD V, malignant HTN,                Action/Plan: Discharge Planning: Spoke to pt and states she has full Medicaid benefits. She previously had Family Planning Medicaid (notes per financial verified Medicaid). She works part-time at [employer]. She is married with 3 small children at home. Pt states she would have to pay for meds out of pocket and would get meds that were inexpensive. Could not afford all medications. Has no PCP so was not compliant with follow up with a PCP. Will arrange follow up at [facility] for follow up with PCP. Pt requested to speak to financial counselor for Social Security Disability.    Expected Discharge Date:                 Expected Discharge Plan:  Home/Self Care  In-House Referral:  Financial Counselor  Discharge planning Services  CM Consult, [facility], Medication Assistance  Post Acute Care Choice:  NA Choice offered to:  NA  DME Arranged:  N/A DME Agency:  NA  HH Arranged:  NA HH Agency:  NA  Status of Service:  In process, will continue to follow  If discussed at Long Length of Stay Meetings, dates discussed:    Additional Comments:  Erenest Rasher, RN 06/23/2017, 3:09 PM

## 2017-06-23 NOTE — Progress Notes (Signed)
[facility] ROUNDING NOTE   Subjective:  54M with hx/o Malignant HTN with TMA admitted with HTN emergency and AoCKD3     Objective:  Vital signs in last 24 hours:  Temp:  [97.4 F (36.3 C)-98.7 F (37.1 C)] 98.4 F (36.9 C) (01/08 0849) Pulse Rate:  [81-91] 91 (01/08 0849) Resp:  [16-25] 18 (01/08 0849) BP: (111-159)/(75-108) 157/87 (01/08 0849) SpO2:  [98 %-100 %] 98 % (01/08 0849) Weight:  [170 lb 12.8 oz (77.5 kg)] 170 lb 12.8 oz (77.5 kg) (01/07 2027)  Weight change: -11.2 oz (-1.226 kg) Filed Weights   06/21/17 0500 06/22/17 0500 06/22/17 2027  Weight: 174 lb (78.9 kg) 173 lb 8 oz (78.7 kg) 170 lb 12.8 oz (77.5 kg)    Intake/Output: I/O last 3 completed shifts: In: 1560 [P.O.:1560] Out: 2250 [Urine:1800; Stool:450]   Intake/Output this shift:  Total I/O In: 240 [P.O.:240] Out: -   CVS- RRR RS- CTA ABD- BS present soft non-distended EXT- no edema   Basic Metabolic Panel: Recent Labs  Lab 06/19/17 0045 06/19/17 0803 06/20/17 1103 06/21/17 0000 06/23/17 0528  NA 133* 131* 131* 129* 133*  K 3.7 3.8 4.0 4.0 4.0  CL 106 105 105 101 100*  CO2 15* 16* 15* 16* 18*  GLUCOSE 110* 136* 167* 123* 121*  BUN 79* 82* 81* 89* 94*  CREATININE 6.54* 6.75* 7.81* 8.53* 9.64*  CALCIUM 8.3* 8.3* 8.1* 8.3* 8.6*  MG  --  1.9  --   --   --   PHOS  --  4.2  --   --  7.6*    Liver Function Tests: Recent Labs  Lab 06/19/17 0045 06/21/17 0321 06/23/17 0528  AST 83* 39  --   ALT 155* 116*  --   ALKPHOS 46 34*  --   BILITOT 0.9 0.6  --   PROT 6.0* 5.1*  --   ALBUMIN 2.9* 2.5* 2.8*   No results for input(s): LIPASE, AMYLASE in the last 168 hours. No results for input(s): AMMONIA in the last 168 hours.  CBC: Recent Labs  Lab 06/19/17 0045 06/19/17 0803 06/20/17 1103 06/21/17 0321 06/22/17 0319 06/23/17 0528  WBC 5.1 5.0 5.2 5.8 4.5 3.8*  NEUTROABS 3.4  --   --  4.2 3.1 2.5  HGB 9.8* 9.1* 9.5* 8.3* 8.7* 8.4*  HCT 30.2* 29.0* 30.6* 26.8*  27.2* 27.1*  MCV 83.4 83.1 84.8 84.3 84.2 84.2  PLT 124* 164 163 176 163 182    Cardiac Enzymes: Recent Labs  Lab 06/19/17 0045  TROPONINI <0.03    BNP: Invalid input(s): POCBNP  CBG: No results for input(s): GLUCAP in the last 168 hours.  Microbiology: Results for orders placed or performed during the hospital encounter of 06/18/17  MRSA PCR Screening     Status: None   Collection Time: 06/19/17 12:39 PM  Result Value Ref Range Status   MRSA by PCR NEGATIVE NEGATIVE Final    Comment:        The GeneXpert MRSA Assay (FDA approved for NASAL specimens only), is one component of a comprehensive MRSA colonization surveillance program. It is not intended to diagnose MRSA infection nor to guide or monitor treatment for MRSA infections.     Coagulation Studies: No results for input(s): LABPROT, INR in the last 72 hours.  Urinalysis: No results for input(s): COLORURINE, LABSPEC, PHURINE, GLUCOSEU, HGBUR, BILIRUBINUR, KETONESUR, PROTEINUR, UROBILINOGEN, NITRITE, LEUKOCYTESUR in the last 72 hours.  Invalid input(s): APPERANCEUR    Imaging: No results found.  Medications:   . sodium chloride    . niCARDipine Stopped (06/20/17 0715)   . carvedilol  25 mg Oral BID WC  . cloNIDine  0.2 mg Oral Q8H  . furosemide  80 mg Oral Daily  . hydrALAZINE  100 mg Oral Q8H  . NIFEdipine  90 mg Oral QHS  . pantoprazole  40 mg Oral Q1200  . sodium bicarbonate  1,300 mg Oral BID   sodium chloride, acetaminophen, Influenza vac split quadrivalent PF, labetalol, pneumococcal 23 valent vaccine  Assessment/ Plan:  1. 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 BL SCr  Progressed to stage 5 with malignant hypertension ; sees Patel at [place] of permanent access per [facility]  Appreciate Dr Early   2. Not uremic; but very low GFR; discussed might need HD during this admission interested in PD  will have placement of permanent access as a back up plan 3. Renal US last year with increased echogenicity  and R 70mm stone, don't think needs repeating w/o renal colic and adequate UOP 2. Uncontrolled HTN with HTN emergency 1. Improved with restart of outpt regimen, holding ACEi 2. Hypervoelmic on exam, needs diuretsis 3. Nonadherence to BP meds as outpt, partially from cost  P 1. NO change to BP regimen     PO lasix 80mg  daily 2. Change NaHCO3 to 1300 BID 3. Not uremic, K ok, no HD needs, monitor closely; maybe will recover w/ BP improved 4. She knows might need HD during this admission      LOS: 4 Madeline Brown W @TODAY @9 :27 AM

## 2017-06-23 NOTE — Progress Notes (Signed)
Subjective: Interval History: none.. Transferred from the intensive care unit to the floor.  Comfortable this morning.  Objective: Vital signs in last 24 hours: Temp:  [97.4 F (36.3 C)-98.7 F (37.1 C)] 98.7 F (37.1 C) (01/08 0451) Pulse Rate:  [81-89] 89 (01/08 0451) Resp:  [16-25] 18 (01/08 0451) BP: (111-159)/(75-108) 141/75 (01/08 0451) SpO2:  [99 %-100 %] 100 % (01/08 0451) Weight:  [170 lb 12.8 oz (77.5 kg)] 170 lb 12.8 oz (77.5 kg) (01/07 2027)  Intake/Output from previous day: 01/07 0701 - 01/08 0700 In: 960 [P.O.:960] Out: 950 [Urine:950] Intake/Output this shift: No intake/output data recorded.  No change in her physical exam.  Lab Results: Recent Labs    06/22/17 0319 06/23/17 0528  WBC 4.5 3.8*  HGB 8.7* 8.4*  HCT 27.2* 27.1*  PLT 163 182   BMET Recent Labs    06/21/17 0000 06/23/17 0528  NA 129* 133*  K 4.0 4.0  CL 101 100*  CO2 16* 18*  GLUCOSE 123* 121*  BUN 89* 94*  CREATININE 8.53* 9.64*  CALCIUM 8.3* 8.6*    Studies/Results: Dg Chest 2 View  Result Date: 06/18/2017 CLINICAL DATA:  Shortness of breath.  Worsening renal insufficiency. EXAM: CHEST  2 VIEW COMPARISON:  Radiograph 02/17/2017 FINDINGS: Cardiomegaly is similar to prior exam. Unchanged mediastinal contours. Small bilateral pleural effusions, improved from prior exam. Mild fluid in the fissures. Vascular congestion with development of mild pulmonary edema. No confluent airspace disease. No pneumothorax. No acute osseous abnormality. IMPRESSION: Worsening pulmonary edema. Persistent but improving pleural effusions. Cardiomegaly is unchanged. Electronically Signed   By: Jeb Levering M.D.   On: 06/18/2017 23:52   Dg Chest Port 1 View  Result Date: 06/20/2017 CLINICAL DATA:  Respiratory failure. EXAM: PORTABLE CHEST 1 VIEW COMPARISON:  06/18/2017. FINDINGS: The heart is enlarged. The lung fields are clear. There is no bony abnormality. Marked improvement in BILATERAL lung opacity,  likely resolving pulmonary edema compared with radiographic 2 days earlier. IMPRESSION: Improved aeration, presumed clearing edema. Electronically Signed   By: Staci Righter M.D.   On: 06/20/2017 09:04   Anti-infectives: Anti-infectives (From admission, onward)   None      Assessment/Plan: s/p Procedure(s): ARTERIOVENOUS (AV) FISTULA CREATION LEFT ARM (Left) Plan AV access tomorrow.  Vein map pending.  She is right-handed and preferably will place a left arm access.  Her creatinine has actually risen 9 from 8 yesterday.  Can place tunneled catheter if desired by nephrology.  Please make this clear in the notes today if catheter is requested as well for tomorrow.   LOS: 4 days   Madeline Brown 06/23/2017, 8:32 AM

## 2017-06-23 NOTE — H&P (View-Only) (Signed)
Subjective: Interval History: none.. Transferred from the intensive care unit to the floor.  Comfortable this morning.  Objective: Vital signs in last 24 hours: Temp:  [97.4 F (36.3 C)-98.7 F (37.1 C)] 98.7 F (37.1 C) (01/08 0451) Pulse Rate:  [81-89] 89 (01/08 0451) Resp:  [16-25] 18 (01/08 0451) BP: (111-159)/(75-108) 141/75 (01/08 0451) SpO2:  [99 %-100 %] 100 % (01/08 0451) Weight:  [170 lb 12.8 oz (77.5 kg)] 170 lb 12.8 oz (77.5 kg) (01/07 2027)  Intake/Output from previous day: 01/07 0701 - 01/08 0700 In: 960 [P.O.:960] Out: 950 [Urine:950] Intake/Output this shift: No intake/output data recorded.  No change in her physical exam.  Lab Results: Recent Labs    06/22/17 0319 06/23/17 0528  WBC 4.5 3.8*  HGB 8.7* 8.4*  HCT 27.2* 27.1*  PLT 163 182   BMET Recent Labs    06/21/17 0000 06/23/17 0528  NA 129* 133*  K 4.0 4.0  CL 101 100*  CO2 16* 18*  GLUCOSE 123* 121*  BUN 89* 94*  CREATININE 8.53* 9.64*  CALCIUM 8.3* 8.6*    Studies/Results: Dg Chest 2 View  Result Date: 06/18/2017 CLINICAL DATA:  Shortness of breath.  Worsening renal insufficiency. EXAM: CHEST  2 VIEW COMPARISON:  Radiograph 02/17/2017 FINDINGS: Cardiomegaly is similar to prior exam. Unchanged mediastinal contours. Small bilateral pleural effusions, improved from prior exam. Mild fluid in the fissures. Vascular congestion with development of mild pulmonary edema. No confluent airspace disease. No pneumothorax. No acute osseous abnormality. IMPRESSION: Worsening pulmonary edema. Persistent but improving pleural effusions. Cardiomegaly is unchanged. Electronically Signed   By: Jeb Levering M.D.   On: 06/18/2017 23:52   Dg Chest Port 1 View  Result Date: 06/20/2017 CLINICAL DATA:  Respiratory failure. EXAM: PORTABLE CHEST 1 VIEW COMPARISON:  06/18/2017. FINDINGS: The heart is enlarged. The lung fields are clear. There is no bony abnormality. Marked improvement in BILATERAL lung opacity,  likely resolving pulmonary edema compared with radiographic 2 days earlier. IMPRESSION: Improved aeration, presumed clearing edema. Electronically Signed   By: Staci Righter M.D.   On: 06/20/2017 09:04   Anti-infectives: Anti-infectives (From admission, onward)   None      Assessment/Plan: s/p Procedure(s): ARTERIOVENOUS (AV) FISTULA CREATION LEFT ARM (Left) Plan AV access tomorrow.  Vein map pending.  She is right-handed and preferably will place a left arm access.  Her creatinine has actually risen 9 from 8 yesterday.  Can place tunneled catheter if desired by nephrology.  Please make this clear in the notes today if catheter is requested as well for tomorrow.   LOS: 4 days   Madeline Brown 06/23/2017, 8:32 AM

## 2017-06-23 NOTE — Progress Notes (Addendum)
PROGRESS NOTE  Madeline Brown  YFV:[id number] DOB: 1976/03/21 DOA: 06/18/2017 PCP: Patient, No Pcp Per   Brief Narrative: Madeline Brown is a 42 y.o. female with a history of untreated HTN, stage III CKD who presented with epistaxis, HTN emergency with pulmonary edema and AKI. BP was 220/140, initially admitted to ICU 1/3, transferred to hospitalist service 1/6. Renal function has worsened with diuresis, though BP is better controlled. Plan is for [facility] and AVF placement 1/9.  Assessment & Plan: Active Problems:   Hypertensive emergency  Hypertensive emergency and uncontrolled HTN: Improving with volume removal.  - Continue outpatient regimen w/coreg, clonidine, hydralazine, nicardipine; holding ACE as below  Acute renal failure on stage III CKD:  - After discussion with Dr. Justin Mend, likelihood of requiring HD during this admission/before AVF matures is high enough to warrant TDC placement. This is requested during procedure tomorrow. - Considering PD as well.  - Appreciate nephrology consultation, continue lasix to 80mg  po daily. Follow UOP.  - Monitoring renal function daily - Avoid nephrotoxins including ACE inhibitors.   Acute pulmonary edema: Resolved. Repeat CXR 1/5 w/cleared edema, not hypoxic.   Metabolic acidosis: Due to renal disease.  - Po bicarb ordered  Normocytic anemia: Likely due to CKD.  - Ferritin low-normal, may benefit from iron supplement. Defer to nephrology - Monitor hgb.   DVT prophylaxis: SCDs Code Status: Full Family Communication: None at bedside Disposition Plan: Home  Consultants:   Nephrology  Vascular surgery  Procedures:   None  Antimicrobials:  None   Subjective: No new complaints. She's tearful worrying about need for urinary catheter if she goes on HD but was relieved to know this is not necessary. Continues to make urine. Denies chest pain, dyspnea, itching, hiccups.   Objective: Vitals:   06/22/17 2027 06/23/17 0242 06/23/17 0451  06/23/17 0849  BP: (!) 139/108 (!) 159/94 (!) 141/75 (!) 157/87  Pulse: 83  89 91  Resp: 18  18 18   Temp: (!) 97.5 F (36.4 C)  98.7 F (37.1 C) 98.4 F (36.9 C)  TempSrc: Oral  Oral Oral  SpO2: 100%  100% 98%  Weight: 77.5 kg (170 lb 12.8 oz)     Height:        Intake/Output Summary (Last 24 hours) at 06/23/2017 1545 Last data filed at 06/23/2017 1424 Gross per 24 hour  Intake 1680 ml  Output 600 ml  Net 1080 ml   Filed Weights   06/21/17 0500 06/22/17 0500 06/22/17 2027  Weight: 78.9 kg (174 lb) 78.7 kg (173 lb 8 oz) 77.5 kg (170 lb 12.8 oz)    Gen: 42 y.o. female in no distress sitting up in bed. Pulm: Non-labored breathing room air. Clear to auscultation bilaterally.  CV: Regular rate and rhythm. No murmur, rub, or gallop. PMI nondisplaced. No JVD, no pedal edema. GI: Abdomen soft, non-tender, non-distended, with normoactive bowel sounds. No organomegaly or masses felt. Ext: Warm, no deformities Skin: No rashes, lesions no ulcers Neuro: Alert and oriented. No focal neurological deficits. No asterixis.  Psych: Judgement and insight appear normal. Mood & affect appropriate.   Data Reviewed: I have personally reviewed following labs and imaging studies  CBC: Recent Labs  Lab 06/19/17 0045 06/19/17 0803 06/20/17 1103 06/21/17 0321 06/22/17 0319 06/23/17 0528  WBC 5.1 5.0 5.2 5.8 4.5 3.8*  NEUTROABS 3.4  --   --  4.2 3.1 2.5  HGB 9.8* 9.1* 9.5* 8.3* 8.7* 8.4*  HCT 30.2* 29.0* 30.6* 26.8* 27.2* 27.1*  MCV 83.4 83.1  84.8 84.3 84.2 84.2  PLT 124* 164 163 176 163 462   Basic Metabolic Panel: Recent Labs  Lab 06/19/17 0045 06/19/17 0803 06/20/17 1103 06/21/17 0000 06/23/17 0528  NA 133* 131* 131* 129* 133*  K 3.7 3.8 4.0 4.0 4.0  CL 106 105 105 101 100*  CO2 15* 16* 15* 16* 18*  GLUCOSE 110* 136* 167* 123* 121*  BUN 79* 82* 81* 89* 94*  CREATININE 6.54* 6.75* 7.81* 8.53* 9.64*  CALCIUM 8.3* 8.3* 8.1* 8.3* 8.6*  MG  --  1.9  --   --   --   PHOS  --  4.2  --    --  7.6*   GFR: Estimated Creatinine Clearance: 7.1 mL/min (A) (by C-G formula based on SCr of 9.64 mg/dL (H)). Liver Function Tests: Recent Labs  Lab 06/19/17 0045 06/21/17 0321 06/23/17 0528  AST 83* 39  --   ALT 155* 116*  --   ALKPHOS 46 34*  --   BILITOT 0.9 0.6  --   PROT 6.0* 5.1*  --   ALBUMIN 2.9* 2.5* 2.8*   No results for input(s): LIPASE, AMYLASE in the last 168 hours. No results for input(s): AMMONIA in the last 168 hours. Coagulation Profile: Recent Labs  Lab 06/19/17 0045  INR 1.07   Cardiac Enzymes: Recent Labs  Lab 06/19/17 0045  TROPONINI <0.03   BNP (last 3 results) No results for input(s): PROBNP in the last 8760 hours. HbA1C: No results for input(s): HGBA1C in the last 72 hours. CBG: No results for input(s): GLUCAP in the last 168 hours. Lipid Profile: No results for input(s): CHOL, HDL, LDLCALC, TRIG, CHOLHDL, LDLDIRECT in the last 72 hours. Thyroid Function Tests: No results for input(s): TSH, T4TOTAL, FREET4, T3FREE, THYROIDAB in the last 72 hours. Anemia Panel: No results for input(s): VITAMINB12, FOLATE, FERRITIN, TIBC, IRON, RETICCTPCT in the last 72 hours. Urine analysis:    Component Value Date/Time   COLORURINE ORANGE (A) 02/13/2017 0827   APPEARANCEUR HAZY (A) 02/13/2017 0827   LABSPEC 1.020 02/13/2017 0827   PHURINE 6.0 02/13/2017 0827   GLUCOSEU NEGATIVE 02/13/2017 0827   HGBUR 3 (A) 02/13/2017 0827   BILIRUBINUR NEGATIVE 02/13/2017 0827   KETONESUR NEGATIVE 02/13/2017 0827   PROTEINUR >300 (A) 02/13/2017 0827   UROBILINOGEN 0.2 06/17/2016 1257   NITRITE NEGATIVE 02/13/2017 0827   LEUKOCYTESUR TRACE (A) 02/13/2017 0827   Recent Results (from the past 240 hour(s))  MRSA PCR Screening     Status: None   Collection Time: 06/19/17 12:39 PM  Result Value Ref Range Status   MRSA by PCR NEGATIVE NEGATIVE Final    Comment:        The GeneXpert MRSA Assay (FDA approved for NASAL specimens only), is one component of  a comprehensive MRSA colonization surveillance program. It is not intended to diagnose MRSA infection nor to guide or monitor treatment for MRSA infections.       Radiology Studies: No results found.  Scheduled Meds: . carvedilol  25 mg Oral BID WC  . cloNIDine  0.2 mg Oral Q8H  . darbepoetin (ARANESP) injection - DIALYSIS  60 mcg Intravenous Weekly  . furosemide  80 mg Oral Daily  . hydrALAZINE  100 mg Oral Q8H  . NIFEdipine  90 mg Oral QHS  . pantoprazole  40 mg Oral Q1200  . sodium bicarbonate  1,300 mg Oral BID   Continuous Infusions: . sodium chloride    . [START ON 06/24/2017]  ceFAZolin (ANCEF) IV    .  ferric gluconate (FERRLECIT/NULECIT) IV 125 mg (06/23/17 1350)  . niCARDipine Stopped (06/20/17 0715)     LOS: 4 days   Time spent: 35 minutes.  Vance Gather, MD [facility] Pager [phone]  If 7PM-7AM, please contact night-coverage [email or web address] Password [facility] 06/23/2017, 3:45 PM

## 2017-06-23 NOTE — Progress Notes (Signed)
VASCULAR LAB PRELIMINARY  PRELIMINARY  PRELIMINARY  PRELIMINARY  Right  Upper Extremity Vein Map    Right Cephalic  Segment Diameter Depth Comment  1. Axilla 3.82mm 11.49mm   2. Mid upper arm 3.35mm 9.30mm thrombus  3. Above AC 3.2mm 7.15mm thrombus  4. In AC 4.42mm mm thrombus  5. Below AC 1.80mm mm   6. Mid forearm mm mm   7. Wrist mm mm    mm mm    mm mm    mm mm    Right Basilic  Segment Diameter Depth Comment  1. Axilla mm mm   2. Mid upper arm 2.22mm 14.53mm   3. Above AC 2.36mm 7.34mm   4. In AC 3.59mm mm   5. Below AC 2.69mm 5.55mm   6. Mid forearm mm mm   7. Wrist mm mm    mm mm    mm mm    mm mm    Left Cephalic not visualized.   Left Basilic  Segment Diameter Depth Comment  1. Axilla mm mm   2. Mid upper arm 1.87mm 13.61mm   3. Above AC 1.33mm mm branch  4. In AC mm mm   5. Below AC mm mm   6. Mid forearm mm mm   7. Wrist mm mm    mm mm    mm mm    mm mm     Landry Mellow, RDMS, RVT  06/23/2017, 10:21 AM

## 2017-06-23 NOTE — Progress Notes (Signed)
   06/23/17 1300  Clinical Encounter Type  Visited With Patient  Visit Type Initial  Referral From Nurse  Consult/Referral To Chaplain  Spiritual Encounters  Spiritual Needs Emotional  Stress Factors  Patient Stress Factors Exhausted  Family Stress Factors None identified;Exhausted    Patient was alone in her room laying in bed. Patient seemed to be in a great emotional distress and very tearful her medical dignosis. She shared about her fears of her being the only bread winner for three young children and a jobless husband. Patient shared about her family dynamics that indicated very little to no family support. I provided emotional support through a discussions, reflections, compassionate presence and prayer.  Madeline Brown a Medical sales representative, [employer]

## 2017-06-24 ENCOUNTER — Inpatient Hospital Stay (HOSPITAL_COMMUNITY): Payer: Medicaid Other | Admitting: Certified Registered Nurse Anesthetist

## 2017-06-24 ENCOUNTER — Encounter (HOSPITAL_COMMUNITY): Payer: Self-pay | Admitting: Certified Registered Nurse Anesthetist

## 2017-06-24 ENCOUNTER — Encounter (HOSPITAL_COMMUNITY)
Admission: EM | Disposition: A | Discharge status: Home / Self Care | Payer: Self-pay | Source: Home / Self Care | Attending: Internal Medicine

## 2017-06-24 ENCOUNTER — Telehealth: Payer: Self-pay | Admitting: Vascular Surgery

## 2017-06-24 ENCOUNTER — Inpatient Hospital Stay (HOSPITAL_COMMUNITY): Payer: Medicaid Other

## 2017-06-24 DIAGNOSIS — N186 End stage renal disease: Secondary | ICD-10-CM

## 2017-06-24 DIAGNOSIS — N185 Chronic kidney disease, stage 5: Secondary | ICD-10-CM

## 2017-06-24 DIAGNOSIS — Z992 Dependence on renal dialysis: Secondary | ICD-10-CM

## 2017-06-24 HISTORY — PX: AV FISTULA PLACEMENT: SHX1204

## 2017-06-24 HISTORY — PX: INSERTION OF DIALYSIS CATHETER: SHX1324

## 2017-06-24 LAB — CBC WITH DIFFERENTIAL/PLATELET
Basophils Absolute: 0 10*3/uL (ref 0.0–0.1)
Basophils Relative: 0 %
EOS ABS: 0.1 10*3/uL (ref 0.0–0.7)
EOS PCT: 2 %
HCT: 31.4 % — ABNORMAL LOW (ref 36.0–46.0)
Hemoglobin: 9.7 g/dL — ABNORMAL LOW (ref 12.0–15.0)
LYMPHS ABS: 1 10*3/uL (ref 0.7–4.0)
LYMPHS PCT: 22 %
MCH: 25.8 pg — AB (ref 26.0–34.0)
MCHC: 30.9 g/dL (ref 30.0–36.0)
MCV: 83.5 fL (ref 78.0–100.0)
MONO ABS: 0.3 10*3/uL (ref 0.1–1.0)
MONOS PCT: 8 %
Neutro Abs: 3 10*3/uL (ref 1.7–7.7)
Neutrophils Relative %: 68 %
PLATELETS: 245 10*3/uL (ref 150–400)
RBC: 3.76 MIL/uL — ABNORMAL LOW (ref 3.87–5.11)
RDW: 16.8 % — ABNORMAL HIGH (ref 11.5–15.5)
WBC: 4.5 10*3/uL (ref 4.0–10.5)

## 2017-06-24 LAB — RENAL FUNCTION PANEL
ANION GAP: 16 — AB (ref 5–15)
Albumin: 3.3 g/dL — ABNORMAL LOW (ref 3.5–5.0)
Albumin: 3.3 g/dL — ABNORMAL LOW (ref 3.5–5.0)
Anion gap: 13 (ref 5–15)
BUN: 90 mg/dL — AB (ref 6–20)
BUN: 92 mg/dL — ABNORMAL HIGH (ref 6–20)
CALCIUM: 9 mg/dL (ref 8.9–10.3)
CHLORIDE: 99 mmol/L — AB (ref 101–111)
CHLORIDE: 99 mmol/L — AB (ref 101–111)
CO2: 21 mmol/L — ABNORMAL LOW (ref 22–32)
CO2: 16 mmol/L — ABNORMAL LOW (ref 22–32)
CREATININE: 9.67 mg/dL — AB (ref 0.44–1.00)
Calcium: 8.7 mg/dL — ABNORMAL LOW (ref 8.9–10.3)
Creatinine, Ser: 9.64 mg/dL — ABNORMAL HIGH (ref 0.44–1.00)
GFR calc non Af Amer: 4 mL/min — ABNORMAL LOW (ref >60)
GFR, EST AFRICAN AMERICAN: 5 mL/min — AB (ref >60)
GFR, EST AFRICAN AMERICAN: 5 mL/min — AB (ref >60)
GFR, EST NON AFRICAN AMERICAN: 4 mL/min — AB (ref >60)
Glucose, Bld: 132 mg/dL — ABNORMAL HIGH (ref 65–99)
Glucose, Bld: 142 mg/dL — ABNORMAL HIGH (ref 65–99)
POTASSIUM: 4.5 mmol/L (ref 3.5–5.1)
Phosphorus: 7.4 mg/dL — ABNORMAL HIGH (ref 2.5–4.6)
Phosphorus: 8.1 mg/dL — ABNORMAL HIGH (ref 2.5–4.6)
Potassium: 4 mmol/L (ref 3.5–5.1)
SODIUM: 133 mmol/L — AB (ref 135–145)
Sodium: 131 mmol/L — ABNORMAL LOW (ref 135–145)

## 2017-06-24 LAB — CBC
HEMATOCRIT: 31 % — AB (ref 36.0–46.0)
HEMOGLOBIN: 9.6 g/dL — AB (ref 12.0–15.0)
MCH: 26.2 pg (ref 26.0–34.0)
MCHC: 31 g/dL (ref 30.0–36.0)
MCV: 84.7 fL (ref 78.0–100.0)
Platelets: 209 10*3/uL (ref 150–400)
RBC: 3.66 MIL/uL — AB (ref 3.87–5.11)
RDW: 16.6 % — ABNORMAL HIGH (ref 11.5–15.5)
WBC: 7.7 10*3/uL (ref 4.0–10.5)

## 2017-06-24 LAB — PROTIME-INR
INR: 1.14
Prothrombin Time: 14.5 seconds (ref 11.4–15.2)

## 2017-06-24 LAB — SURGICAL PCR SCREEN
MRSA, PCR: NEGATIVE
Staphylococcus aureus: NEGATIVE

## 2017-06-24 LAB — HCG, SERUM, QUALITATIVE: PREG SERUM: NEGATIVE

## 2017-06-24 SURGERY — ARTERIOVENOUS (AV) FISTULA CREATION
Anesthesia: General | Site: Neck | Laterality: Right

## 2017-06-24 MED ORDER — OXYCODONE-ACETAMINOPHEN 5-325 MG PO TABS
1.0000 | ORAL_TABLET | ORAL | Status: DC | PRN
  Administered 2017-06-24 – 2017-06-26 (×7): 2 via ORAL
  Filled 2017-06-24 (×6): qty 2

## 2017-06-24 MED ORDER — 0.9 % SODIUM CHLORIDE (POUR BTL) OPTIME
TOPICAL | Status: DC | PRN
  Administered 2017-06-24: 1000 mL

## 2017-06-24 MED ORDER — LIDOCAINE HCL (PF) 1 % IJ SOLN: 5.0000 mL | INTRAMUSCULAR | Status: DC | PRN

## 2017-06-24 MED ORDER — MIDAZOLAM HCL 2 MG/2ML IJ SOLN
INTRAMUSCULAR | Status: AC
  Filled 2017-06-24: qty 2

## 2017-06-24 MED ORDER — FENTANYL CITRATE (PF) 100 MCG/2ML IJ SOLN
INTRAMUSCULAR | Status: DC | PRN
  Administered 2017-06-24 (×5): 50 ug via INTRAVENOUS

## 2017-06-24 MED ORDER — HEPARIN SODIUM (PORCINE) 1000 UNIT/ML IJ SOLN
INTRAMUSCULAR | Status: AC
  Filled 2017-06-24: qty 1

## 2017-06-24 MED ORDER — PENTAFLUOROPROP-TETRAFLUOROETH EX AERO: 1.0000 "application " | INHALATION_SPRAY | CUTANEOUS | Status: DC | PRN

## 2017-06-24 MED ORDER — DEXAMETHASONE SODIUM PHOSPHATE 10 MG/ML IJ SOLN
INTRAMUSCULAR | Status: DC | PRN
  Administered 2017-06-24: 10 mg via INTRAVENOUS

## 2017-06-24 MED ORDER — ONDANSETRON HCL 4 MG/2ML IJ SOLN: 4.0000 mg | Freq: Once | INTRAMUSCULAR | Status: DC | PRN

## 2017-06-24 MED ORDER — ONDANSETRON HCL 4 MG/2ML IJ SOLN
INTRAMUSCULAR | Status: DC | PRN
  Administered 2017-06-24: 4 mg via INTRAVENOUS

## 2017-06-24 MED ORDER — TRAMADOL HCL 50 MG PO TABS: 50.0000 mg | ORAL_TABLET | Freq: Four times a day (QID) | ORAL | 0 refills | Status: DC | PRN

## 2017-06-24 MED ORDER — FENTANYL CITRATE (PF) 250 MCG/5ML IJ SOLN
INTRAMUSCULAR | Status: AC
  Filled 2017-06-24: qty 5

## 2017-06-24 MED ORDER — SODIUM CHLORIDE 0.9 % IV SOLN: 100.0000 mL | INTRAVENOUS | Status: DC | PRN

## 2017-06-24 MED ORDER — OXYCODONE-ACETAMINOPHEN 5-325 MG PO TABS
ORAL_TABLET | ORAL | Status: AC
  Administered 2017-06-24: 2 via ORAL
  Filled 2017-06-24: qty 2

## 2017-06-24 MED ORDER — HEPARIN SODIUM (PORCINE) 1000 UNIT/ML IJ SOLN
INTRAMUSCULAR | Status: DC | PRN
  Administered 2017-06-24: 1000 [IU]

## 2017-06-24 MED ORDER — SODIUM CHLORIDE 0.9 % IV SOLN
INTRAVENOUS | Status: DC
  Administered 2017-06-24: 12:00:00 via INTRAVENOUS

## 2017-06-24 MED ORDER — LIDOCAINE 2% (20 MG/ML) 5 ML SYRINGE
INTRAMUSCULAR | Status: DC | PRN
  Administered 2017-06-24: 50 mg via INTRAVENOUS

## 2017-06-24 MED ORDER — LIDOCAINE-PRILOCAINE 2.5-2.5 % EX CREA: 1.0000 "application " | TOPICAL_CREAM | CUTANEOUS | Status: DC | PRN

## 2017-06-24 MED ORDER — FENTANYL CITRATE (PF) 100 MCG/2ML IJ SOLN
INTRAMUSCULAR | Status: AC
  Administered 2017-06-24: 50 ug via INTRAVENOUS
  Filled 2017-06-24: qty 2

## 2017-06-24 MED ORDER — PROPOFOL 10 MG/ML IV BOLUS
INTRAVENOUS | Status: DC | PRN
  Administered 2017-06-24: 150 mg via INTRAVENOUS
  Administered 2017-06-24: 50 mg via INTRAVENOUS

## 2017-06-24 MED ORDER — ALTEPLASE 2 MG IJ SOLR: 2.0000 mg | Freq: Once | INTRAMUSCULAR | Status: DC | PRN

## 2017-06-24 MED ORDER — PROPOFOL 10 MG/ML IV BOLUS
INTRAVENOUS | Status: AC
  Filled 2017-06-24: qty 20

## 2017-06-24 MED ORDER — SODIUM CHLORIDE 0.9 % IV SOLN
INTRAVENOUS | Status: DC | PRN
  Administered 2017-06-24: 14:00:00 500 mL

## 2017-06-24 MED ORDER — GLYCOPYRROLATE 0.2 MG/ML IJ SOLN
INTRAMUSCULAR | Status: DC | PRN
  Administered 2017-06-24: 0.2 mg via INTRAVENOUS

## 2017-06-24 MED ORDER — LIDOCAINE-EPINEPHRINE 0.5 %-1:200000 IJ SOLN
INTRAMUSCULAR | Status: AC
  Filled 2017-06-24: qty 1

## 2017-06-24 MED ORDER — FENTANYL CITRATE (PF) 100 MCG/2ML IJ SOLN
25.0000 ug | INTRAMUSCULAR | Status: DC | PRN
  Administered 2017-06-24 (×2): 50 ug via INTRAVENOUS

## 2017-06-24 MED ORDER — HEPARIN SODIUM (PORCINE) 1000 UNIT/ML DIALYSIS: 1000.0000 [IU] | INTRAMUSCULAR | Status: DC | PRN

## 2017-06-24 SURGICAL SUPPLY — 49 items
ARMBAND PINK RESTRICT EXTREMIT (MISCELLANEOUS) ×8 IMPLANT
BAG DECANTER FOR FLEXI CONT (MISCELLANEOUS) ×4 IMPLANT
BIOPATCH RED 1 DISK 7.0 (GAUZE/BANDAGES/DRESSINGS) ×6 IMPLANT
BIOPATCH RED 1IN DISK 7.0MM (GAUZE/BANDAGES/DRESSINGS) ×2
CANISTER SUCT 3000ML PPV (MISCELLANEOUS) ×4 IMPLANT
CANNULA VESSEL 3MM 2 BLNT TIP (CANNULA) ×4 IMPLANT
CATH PALINDROME RT-P 15FX19CM (CATHETERS) IMPLANT
CATH PALINDROME RT-P 15FX23CM (CATHETERS) ×4 IMPLANT
CATH PALINDROME RT-P 15FX28CM (CATHETERS) IMPLANT
CATH PALINDROME RT-P 15FX55CM (CATHETERS) IMPLANT
CLIP LIGATING EXTRA MED SLVR (CLIP) ×4 IMPLANT
CLIP LIGATING EXTRA SM BLUE (MISCELLANEOUS) ×4 IMPLANT
COVER PROBE W GEL 5X96 (DRAPES) ×4 IMPLANT
COVER SURGICAL LIGHT HANDLE (MISCELLANEOUS) ×4 IMPLANT
DECANTER SPIKE VIAL GLASS SM (MISCELLANEOUS) ×4 IMPLANT
DERMABOND ADVANCED (GAUZE/BANDAGES/DRESSINGS) ×2
DERMABOND ADVANCED .7 DNX12 (GAUZE/BANDAGES/DRESSINGS) ×2 IMPLANT
DRAPE C-ARM 42X72 X-RAY (DRAPES) ×4 IMPLANT
DRAPE CHEST BREAST 15X10 FENES (DRAPES) ×4 IMPLANT
ELECT REM PT RETURN 9FT ADLT (ELECTROSURGICAL) ×4
ELECTRODE REM PT RTRN 9FT ADLT (ELECTROSURGICAL) ×2 IMPLANT
GLOVE BIO SURGEON STRL SZ7 (GLOVE) ×4 IMPLANT
GLOVE SS BIOGEL STRL SZ 7.5 (GLOVE) ×4 IMPLANT
GLOVE SUPERSENSE BIOGEL SZ 7.5 (GLOVE) ×4
GOWN STRL REUS W/ TWL LRG LVL3 (GOWN DISPOSABLE) ×6 IMPLANT
GOWN STRL REUS W/TWL LRG LVL3 (GOWN DISPOSABLE) ×6
KIT BASIN OR (CUSTOM PROCEDURE TRAY) ×4 IMPLANT
KIT ROOM TURNOVER OR (KITS) ×4 IMPLANT
NEEDLE 18GX1X1/2 (RX/OR ONLY) (NEEDLE) ×8 IMPLANT
NEEDLE 22X1 1/2 (OR ONLY) (NEEDLE) IMPLANT
NEEDLE HYPO 25GX1X1/2 BEV (NEEDLE) ×4 IMPLANT
NS IRRIG 1000ML POUR BTL (IV SOLUTION) ×4 IMPLANT
PACK CV ACCESS (CUSTOM PROCEDURE TRAY) ×4 IMPLANT
PACK SURGICAL SETUP 50X90 (CUSTOM PROCEDURE TRAY) ×4 IMPLANT
PAD ARMBOARD 7.5X6 YLW CONV (MISCELLANEOUS) ×8 IMPLANT
SOAP 2 % CHG 4 OZ (WOUND CARE) ×4 IMPLANT
SUT ETHILON 3 0 PS 1 (SUTURE) ×4 IMPLANT
SUT PROLENE 6 0 CC (SUTURE) ×4 IMPLANT
SUT VIC AB 3-0 SH 27 (SUTURE) ×2
SUT VIC AB 3-0 SH 27X BRD (SUTURE) ×2 IMPLANT
SUT VICRYL 4-0 PS2 18IN ABS (SUTURE) ×4 IMPLANT
SYR 10ML LL (SYRINGE) ×4 IMPLANT
SYR 20CC LL (SYRINGE) ×4 IMPLANT
SYR 5ML LL (SYRINGE) ×12 IMPLANT
SYR CONTROL 10ML LL (SYRINGE) ×4 IMPLANT
TOWEL GREEN STERILE (TOWEL DISPOSABLE) ×8 IMPLANT
TOWEL GREEN STERILE FF (TOWEL DISPOSABLE) ×4 IMPLANT
UNDERPAD 30X30 (UNDERPADS AND DIAPERS) ×4 IMPLANT
WATER STERILE IRR 1000ML POUR (IV SOLUTION) ×4 IMPLANT

## 2017-06-24 NOTE — Progress Notes (Signed)
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 admitted with HTN emergency and  Appears to now be end stage renal disease  She has uremic odor and asterixis   Creatinine does not seem to be improving My guess is that this final episode of malignant hypertension has worsened her already tenuous renal function    Objective:  Vital signs in last 24 hours:  Temp:  [98 F (36.7 C)-98.5 F (36.9 C)] 98.1 F (36.7 C) (01/09 0922) Pulse Rate:  [89-93] 90 (01/09 0922) Resp:  [18-20] 20 (01/09 0922) BP: (148-178)/(61-97) 156/89 (01/09 0922) SpO2:  [94 %-100 %] 100 % (01/09 0922) Weight:  [170 lb 1.6 oz (77.2 kg)] 170 lb 1.6 oz (77.2 kg) (01/08 2202)  Weight change: -11.2 oz (-0.318 kg) Filed Weights   06/22/17 0500 06/22/17 2027 06/23/17 2202  Weight: 173 lb 8 oz (78.7 kg) 170 lb 12.8 oz (77.5 kg) 170 lb 1.6 oz (77.2 kg)    Intake/Output: I/O last 3 completed shifts: In: 3664 [P.O.:1560; IV Piggyback:220] Out: 0    Intake/Output this shift:  No intake/output data recorded.  CVS- RRR no rub RS- CTA ABD- BS present soft non-distended EXT- no edema asterixes Basic Metabolic Panel: Recent Labs  Lab 06/19/17 0803 06/20/17 1103 06/21/17 0000 06/23/17 0528 06/24/17 0743  NA 131* 131* 129* 133* 133*  K 3.8 4.0 4.0 4.0 4.0  CL 105 105 101 100* 99*  CO2 16* 15* 16* 18* 21*  GLUCOSE 136* 167* 123* 121* 132*  BUN 82* 81* 89* 94* 90*  CREATININE 6.75* 7.81* 8.53* 9.64* 9.67*  CALCIUM 8.3* 8.1* 8.3* 8.6* 9.0  MG 1.9  --   --   --   --   PHOS 4.2  --   --  7.6* 7.4*    Liver Function Tests: Recent Labs  Lab 06/19/17 0045 06/21/17 0321 06/23/17 0528 06/24/17 0743  AST 83* 39  --   --   ALT 155* 116*  --   --   ALKPHOS 46 34*  --   --   BILITOT 0.9 0.6  --   --   PROT 6.0* 5.1*  --   --   ALBUMIN 2.9* 2.5* 2.8* 3.3*   No results for input(s): LIPASE, AMYLASE in the last 168 hours. No results for input(s): AMMONIA in  the last 168 hours.  CBC: Recent Labs  Lab 06/19/17 0045  06/20/17 1103 06/21/17 0321 06/22/17 0319 06/23/17 0528 06/24/17 0743  WBC 5.1   < > 5.2 5.8 4.5 3.8* 4.5  NEUTROABS 3.4  --   --  4.2 3.1 2.5 3.0  HGB 9.8*   < > 9.5* 8.3* 8.7* 8.4* 9.7*  HCT 30.2*   < > 30.6* 26.8* 27.2* 27.1* 31.4*  MCV 83.4   < > 84.8 84.3 84.2 84.2 83.5  PLT 124*   < > 163 176 163 182 245   < > = values in this interval not displayed.    Cardiac Enzymes: Recent Labs  Lab 06/19/17 0045  TROPONINI <0.03    BNP: Invalid input(s): POCBNP  CBG: No results for input(s): GLUCAP in the last 168 hours.  Microbiology: Results for orders placed or performed during the hospital encounter of 06/18/17  MRSA PCR Screening     Status: None   Collection Time: 06/19/17 12:39 PM  Result Value Ref Range Status   MRSA by PCR NEGATIVE NEGATIVE Final    Comment:  The GeneXpert MRSA Assay (FDA approved for NASAL specimens only), is one component of a comprehensive MRSA colonization surveillance program. It is not intended to diagnose MRSA infection nor to guide or monitor treatment for MRSA infections.   Surgical pcr screen     Status: None   Collection Time: 06/24/17  3:51 AM  Result Value Ref Range Status   MRSA, PCR NEGATIVE NEGATIVE Final   Staphylococcus aureus NEGATIVE NEGATIVE Final    Comment: (NOTE) The Xpert SA Assay (FDA approved for NASAL specimens in patients 53 years of age and older), is one component of a comprehensive surveillance program. It is not intended to diagnose infection nor to guide or monitor treatment.     Coagulation Studies: Recent Labs    06/24/17 0743  LABPROT 14.5  INR 1.14    Urinalysis: No results for input(s): COLORURINE, LABSPEC, PHURINE, GLUCOSEU, HGBUR, BILIRUBINUR, KETONESUR, PROTEINUR, UROBILINOGEN, NITRITE, LEUKOCYTESUR in the last 72 hours.  Invalid input(s): APPERANCEUR    Imaging: No results found.   Medications:   . sodium  chloride    .  ceFAZolin (ANCEF) IV    . ferric gluconate (FERRLECIT/NULECIT) IV Stopped (06/23/17 1450)  . niCARDipine Stopped (06/20/17 0715)   . carvedilol  25 mg Oral BID WC  . cloNIDine  0.2 mg Oral Q8H  . darbepoetin (ARANESP) injection - DIALYSIS  60 mcg Intravenous Weekly  . furosemide  80 mg Oral Daily  . hydrALAZINE  100 mg Oral Q8H  . NIFEdipine  90 mg Oral QHS  . pantoprazole  40 mg Oral Q1200  . sodium bicarbonate  1,300 mg Oral BID   sodium chloride, acetaminophen, Influenza vac split quadrivalent PF, labetalol, pneumococcal 23 valent vaccine, simethicone  Assessment/ Plan:  1. AoCKD3 ? 2/2 malignant HTN now with progression 1. 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  uremic;Fetor but very low GFR; discussed might need HD during this admissioninterested in PD will have placement of permanent access as a back up plan will also start hemodialysis clip process  3. Renal US last year with increased echogenicity and R 94mm stone, don't think needs repeating w/o renal colic and adequate UOP 2. Uncontrolled HTN with HTN emergency 1. Improved with restart of outpt regimen, holding ACEi 2. Hypervoelmic on exam, will start dialysis 3. Nonadherence to BP meds as outpt, partially from cost 4. Stop sodium bicarbonate 5. Stop lasix as starting dialysis       LOS: 5 Praise Stennett W @TODAY @10 :13 AM

## 2017-06-24 NOTE — Telephone Encounter (Signed)
Sched appt 07/28/17; lab at 11:00 and MD at 11:45. Spoke to SGO, he siad he will call pt and wanted pt to call office back.

## 2017-06-24 NOTE — Transfer of Care (Signed)
Immediate Anesthesia Transfer of Care Note  Patient: Madeline Brown  Procedure(s) Performed: CREATION OF LEFT ARM BRACHIALCEPHALIC  ARTERIOVENOUS (AV) FISTULA (Left Arm Upper) INSERTION OF TUNNELED DIALYSIS CATHETER (Right Neck)  Patient Location: PACU  Anesthesia Type:General  Level of Consciousness: patient cooperative and responds to stimulation  Airway & Oxygen Therapy: Patient Spontanous Breathing and Patient connected to nasal cannula oxygen  Post-op Assessment: Report given to RN and Post -op Vital signs reviewed and stable  Post vital signs: Reviewed and stable  Last Vitals:  Vitals:   06/24/17 0828 06/24/17 0922  BP: (!) 178/97 (!) 156/89  Pulse:  90  Resp:  20  Temp:  36.7 C  SpO2:  100%    Last Pain:  Vitals:   06/24/17 0922  TempSrc: Oral  PainSc:       Patients Stated Pain Goal: 0 (88/11/03 1594)  Complications: No apparent anesthesia complications

## 2017-06-24 NOTE — Discharge Instructions (Signed)
° °  [facility] ° °Discharge Instructions ° °AV Fistula or Graft Surgery for Dialysis Access ° °Please refer to the following instructions for your post-procedure care. Your surgeon or physician assistant will discuss any changes with you. ° °Activity ° °You may drive the day following your surgery, if you are comfortable and no longer taking prescription pain medication. Resume full activity as the soreness in your incision resolves. ° °Bathing/Showering ° °You may shower after you go home. Keep your incision dry for 48 hours. Do not soak in a bathtub, hot tub, or swim until the incision heals completely. You may not shower if you have a hemodialysis catheter. ° °Incision Care ° °Clean your incision with mild soap and water after 48 hours. Pat the area dry with a clean towel. You do not need a bandage unless otherwise instructed. Do not apply any ointments or creams to your incision. You may have skin glue on your incision. Do not peel it off. It will come off on its own in about one week. Your arm may swell a bit after surgery. To reduce swelling use pillows to elevate your arm so it is above your heart. Your doctor will tell you if you need to lightly wrap your arm with an ACE bandage. ° °Diet ° °Resume your normal diet. There are not special food restrictions following this procedure. In order to heal from your surgery, it is CRITICAL to get adequate nutrition. Your body requires vitamins, minerals, and protein. Vegetables are the best source of vitamins and minerals. Vegetables also provide the perfect balance of protein. Processed food has little nutritional value, so try to avoid this. ° °Medications ° °Resume taking all of your medications. If your incision is causing pain, you may take over-the counter pain relievers such as acetaminophen (Tylenol). If you were prescribed a stronger pain medication, please be aware these medications can cause nausea and constipation. Prevent  nausea by taking the medication with a snack or meal. Avoid constipation by drinking plenty of fluids and eating foods with high amount of fiber, such as fruits, vegetables, and grains. Do not take Tylenol if you are taking prescription pain medications. ° ° ° ° °Follow up °Your surgeon may want to see you in the office following your access surgery. If so, this will be arranged at the time of your surgery. ° °Please call us immediately for any of the following conditions: ° °Increased pain, redness, drainage (pus) from your incision site °Fever of 101 degrees or higher °Severe or worsening pain at your incision site °Hand pain or numbness. ° °Reduce your risk of vascular disease: ° °Stop smoking. If you would like help, call [facility] at 1-800-QUIT-NOW (1-800-784-8669) or [place] at [phone] ° °Manage your cholesterol °Maintain a desired weight °Control your diabetes °Keep your blood pressure down ° °Dialysis ° °It will take several weeks to several months for your new dialysis access to be ready for use. Your surgeon will determine when it is OK to use it. Your nephrologist will continue to direct your dialysis. You can continue to use your Permcath until your new access is ready for use. ° °If you have any questions, please call the office at [phone]. ° °

## 2017-06-24 NOTE — Interval H&P Note (Signed)
History and Physical Interval Note:  06/24/2017 12:18 PM  Madeline Brown  has presented today for surgery, with the diagnosis of CHRONIC KIDNEY DISEASE  The various methods of treatment have been discussed with the patient and family. After consideration of risks, benefits and other options for treatment, the patient has consented to  Procedure(s): ARTERIOVENOUS (AV) FISTULA CREATION LEFT ARM (Left) INSERTION OF TUNNELED DIALYSIS CATHETER (N/A) as a surgical intervention .  The patient's history has been reviewed, patient examined, no change in status, stable for surgery.  I have reviewed the patient's chart and labs.  Questions were answered to the patient's satisfaction.     Curt Jews

## 2017-06-24 NOTE — Telephone Encounter (Signed)
-----   Message from Mena Goes, RN sent at 06/24/2017  2:36 PM EST ----- Regarding: 4-6 weeks   ----- Message ----- From: Iline Oven Sent: 06/24/2017   2:26 PM To: [facility] Charge Pool  Can you schedule an appt for this pt in about 4-6 weeks with Dr. Donnetta Hutching and fistula duplex.  PO L brachiocephalic fistula. Thanks, [facility]

## 2017-06-24 NOTE — Progress Notes (Signed)
PROGRESS NOTE    Madeline Brown  OFB:[id number] DOB: 1976/01/10 DOA: 06/18/2017 PCP: Patient, No Pcp Per   Brief Narrative: Patient is a 42 year old female with past medical history of untreated hypertension, CKD stage III who presented with epistaxis, hypertensive emergency with pulmonary edema and acute kidney injury.  He was found to be 220/ 140 on presentation.  She was admitted to ICU on 1/3 and was transferred to hospital service on 1/6.Renal function continued to worsen.  Patient underwent temporary dialysis catheter placement and AV fistula placement today.  Plan is to start on dialysis tomorrow.  Assessment & Plan:   Active Problems:   Hypertensive emergency   ESRD (end stage renal disease) ([place])   End-stage renal disease on dialysis: Status post temporary dialysis catheter placement and fistula placement.  Nephrology following. Continue Lasix 80 mg daily. Will monitor renal function daily Avoid nephrotoxins  Hypertensive emergency: Blood pressure much better controlled now.  We will continue Coreg, clonidine, hydralazine, nicardipine.  Hold ACE/ARB  Pulmonary edema: Resolved.  Patient saturating normally on room air  Metabolic acidosis: Secondary to renal disease.  On bicarb  Normocytic anemia: Likely secondary to his CKD.  Will monitor H&H.   DVT prophylaxis: SCD Code Status: Full Family Communication: None present on the bedside Disposition Plan: Home   Consultants: Nephrology  Procedures: AV fistula placement, temporary dialysis catheter placement  Antimicrobials:None  Subjective: Patient seen and examined at the  bedside in the afternoon.  Remains comfortable.  No new complaints/issues  Objective: Vitals:   06/24/17 1600 06/24/17 1615 06/24/17 1630 06/24/17 1716  BP: (!) 170/106 (!) 170/103  (!) 186/108  Pulse: 87 88 91 95  Resp: 17 16  20   Temp:   98.1 F (36.7 C) 97.7 F (36.5 C)  TempSrc:    Oral  SpO2: 95% 93% 92% 99%  Weight:      Height:          Intake/Output Summary (Last 24 hours) at 06/24/2017 1742 Last data filed at 06/24/2017 1600 Gross per 24 hour  Intake 910 ml  Output -  Net 910 ml   Filed Weights   06/22/17 2027 06/23/17 2202 06/24/17 1151  Weight: 77.5 kg (170 lb 12.8 oz) 77.2 kg (170 lb 1.6 oz) 77.1 kg (170 lb)    Examination:  General exam: Appears calm and comfortable ,Not in distress,average built Respiratory system: Bilateral equal air entry, normal vesicular breath sounds, no wheezes or crackles  Cardiovascular system: S1 & S2 heard, RRR. No JVD, murmurs, rubs, gallops or clicks. No pedal edema, temporary dialysis catheter on the right chest Gastrointestinal system: Abdomen is nondistended, soft and nontender. No organomegaly or masses felt. Normal bowel sounds heard. Central nervous system: Alert and oriented. No focal neurological deficits. Extremities: No edema, no clubbing ,no cyanosis, distal peripheral pulses palpable. AV fistula on the left arm Skin: No rashes, lesions or ulcers,no icterus ,no pallor Psychiatry: Judgement and insight appear normal. Mood & affect appropriate.     Data Reviewed: I have personally reviewed following labs and imaging studies  CBC: Recent Labs  Lab 06/19/17 0045  06/20/17 1103 06/21/17 0321 06/22/17 0319 06/23/17 0528 06/24/17 0743  WBC 5.1   < > 5.2 5.8 4.5 3.8* 4.5  NEUTROABS 3.4  --   --  4.2 3.1 2.5 3.0  HGB 9.8*   < > 9.5* 8.3* 8.7* 8.4* 9.7*  HCT 30.2*   < > 30.6* 26.8* 27.2* 27.1* 31.4*  MCV 83.4   < > 84.8 84.3  84.2 84.2 83.5  PLT 124*   < > 163 176 163 182 245   < > = values in this interval not displayed.   Basic Metabolic Panel: Recent Labs  Lab 06/19/17 0803 06/20/17 1103 06/21/17 0000 06/23/17 0528 06/24/17 0743  NA 131* 131* 129* 133* 133*  K 3.8 4.0 4.0 4.0 4.0  CL 105 105 101 100* 99*  CO2 16* 15* 16* 18* 21*  GLUCOSE 136* 167* 123* 121* 132*  BUN 82* 81* 89* 94* 90*  CREATININE 6.75* 7.81* 8.53* 9.64* 9.67*  CALCIUM 8.3* 8.1*  8.3* 8.6* 9.0  MG 1.9  --   --   --   --   PHOS 4.2  --   --  7.6* 7.4*   GFR: Estimated Creatinine Clearance: 7.1 mL/min (A) (by C-G formula based on SCr of 9.67 mg/dL (H)). Liver Function Tests: Recent Labs  Lab 06/19/17 0045 06/21/17 0321 06/23/17 0528 06/24/17 0743  AST 83* 39  --   --   ALT 155* 116*  --   --   ALKPHOS 46 34*  --   --   BILITOT 0.9 0.6  --   --   PROT 6.0* 5.1*  --   --   ALBUMIN 2.9* 2.5* 2.8* 3.3*   No results for input(s): LIPASE, AMYLASE in the last 168 hours. No results for input(s): AMMONIA in the last 168 hours. Coagulation Profile: Recent Labs  Lab 06/19/17 0045 06/24/17 0743  INR 1.07 1.14   Cardiac Enzymes: Recent Labs  Lab 06/19/17 0045  TROPONINI <0.03   BNP (last 3 results) No results for input(s): PROBNP in the last 8760 hours. HbA1C: No results for input(s): HGBA1C in the last 72 hours. CBG: No results for input(s): GLUCAP in the last 168 hours. Lipid Profile: No results for input(s): CHOL, HDL, LDLCALC, TRIG, CHOLHDL, LDLDIRECT in the last 72 hours. Thyroid Function Tests: No results for input(s): TSH, T4TOTAL, FREET4, T3FREE, THYROIDAB in the last 72 hours. Anemia Panel: No results for input(s): VITAMINB12, FOLATE, FERRITIN, TIBC, IRON, RETICCTPCT in the last 72 hours. Sepsis Labs: Recent Labs  Lab 06/19/17 0033  LATICACIDVEN 0.60    Recent Results (from the past 240 hour(s))  MRSA PCR Screening     Status: None   Collection Time: 06/19/17 12:39 PM  Result Value Ref Range Status   MRSA by PCR NEGATIVE NEGATIVE Final    Comment:        The GeneXpert MRSA Assay (FDA approved for NASAL specimens only), is one component of a comprehensive MRSA colonization surveillance program. It is not intended to diagnose MRSA infection nor to guide or monitor treatment for MRSA infections.   Surgical pcr screen     Status: None   Collection Time: 06/24/17  3:51 AM  Result Value Ref Range Status   MRSA, PCR NEGATIVE  NEGATIVE Final   Staphylococcus aureus NEGATIVE NEGATIVE Final    Comment: (NOTE) The Xpert SA Assay (FDA approved for NASAL specimens in patients 43 years of age and older), is one component of a comprehensive surveillance program. It is not intended to diagnose infection nor to guide or monitor treatment.          Radiology Studies: Dg Chest Port 1 View  Result Date: 06/24/2017 CLINICAL DATA:  Dialysis catheter placement EXAM: PORTABLE CHEST 1 VIEW COMPARISON:  06/20/2017 FINDINGS: 1508 hours. The cardio pericardial silhouette is enlarged. Right dialysis catheter is new in the interval with tip overlying the mid SVC. No pneumothorax. Lungs  clear bilaterally. No pleural effusion. IMPRESSION: Right IJ dialysis catheter tip overlies the mid right atrium without evidence for pneumothorax. Electronically Signed   By: Misty Stanley M.D.   On: 06/24/2017 15:47   Dg Fluoro Guide Cv Line-no Report  Result Date: 06/24/2017 Fluoroscopy was utilized by the requesting physician.  No radiographic interpretation.        Scheduled Meds: . carvedilol  25 mg Oral BID WC  . cloNIDine  0.2 mg Oral Q8H  . darbepoetin (ARANESP) injection - DIALYSIS  60 mcg Intravenous Weekly  . hydrALAZINE  100 mg Oral Q8H  . NIFEdipine  90 mg Oral QHS  . pantoprazole  40 mg Oral Q1200   Continuous Infusions: . sodium chloride    . sodium chloride    . sodium chloride    . sodium chloride 10 mL/hr at 06/24/17 1204  . ferric gluconate (FERRLECIT/NULECIT) IV Stopped (06/23/17 1450)  . niCARDipine Stopped (06/20/17 0715)     LOS: 5 days    Time spent: 25 minutes    Dupree Givler Jodie Echevaria, MD [facility] Pager [phone]  If 7PM-7AM, please contact night-coverage [email or web address] Password [facility] 06/24/2017, 5:42 PM

## 2017-06-24 NOTE — Anesthesia Postprocedure Evaluation (Signed)
Anesthesia Post Note  Patient: [name]  Procedure(s) Performed: CREATION OF LEFT ARM BRACHIALCEPHALIC  ARTERIOVENOUS (AV) FISTULA (Left Arm Upper) INSERTION OF TUNNELED DIALYSIS CATHETER (Right Neck)     Patient location during evaluation: PACU Anesthesia Type: General Level of consciousness: awake and alert Pain management: pain level controlled Vital Signs Assessment: post-procedure vital signs reviewed and stable Respiratory status: spontaneous breathing, nonlabored ventilation, respiratory function stable and patient connected to nasal cannula oxygen Cardiovascular status: blood pressure returned to baseline and stable Postop Assessment: no apparent nausea or vomiting Anesthetic complications: no    Last Vitals:  Vitals:   06/24/17 1630 06/24/17 1716  BP:  (!) 186/108  Pulse: 91 95  Resp:  20  Temp: 36.7 C 36.5 C  SpO2: 92% 99%    Last Pain:  Vitals:   06/24/17 1716  TempSrc: Oral  PainSc:                  Ryan P Ellender

## 2017-06-24 NOTE — Anesthesia Preprocedure Evaluation (Addendum)
Anesthesia Evaluation  Patient identified by MRN, date of birth, ID band Patient awake    Reviewed: Allergy & Precautions, NPO status , Patient's Chart, lab work & pertinent test results, reviewed documented beta blocker date and time   Airway Mallampati: II  TM Distance: >3 FB Neck ROM: Full    Dental  (+) Loose,  Prominent upper centrals:   Pulmonary Current Smoker,    Pulmonary exam normal breath sounds clear to auscultation       Cardiovascular hypertension, Pt. on home beta blockers and Pt. on medications Normal cardiovascular exam Rhythm:Regular Rate:Normal  ECG: SR, rate 77. LAE, LVH  ECHO: - Left ventricle: The cavity size was normal. Wall thickness was increased in a pattern of moderate LVH. Systolic function was normal. The estimated ejection fraction was in the range of 50% to 55%. The study is not technically sufficient to allow evaluation of LV diastolic function. Mitral valve: There was mild regurgitation. Pericardium, extracardiac: A trivial pericardial effusion was identified.   Neuro/Psych negative neurological ROS  negative psych ROS   GI/Hepatic negative GI ROS, Neg liver ROS,   Endo/Other  negative endocrine ROS  Renal/GU ESRFRenal disease     Musculoskeletal negative musculoskeletal ROS (+)   Abdominal (+) + obese,   Peds  Hematology  (+) anemia ,   Anesthesia Other Findings   Reproductive/Obstetrics                            Anesthesia Physical Anesthesia Plan  ASA: III  Anesthesia Plan: General   Post-op Pain Management:    Induction: Intravenous  PONV Risk Score and Plan: 2 and Ondansetron, Dexamethasone and Treatment may vary due to age or medical condition  Airway Management Planned: LMA  Additional Equipment:   Intra-op Plan:   Post-operative Plan: Extubation in OR  Informed Consent: I have reviewed the patients History and Physical, chart, labs  and discussed the procedure including the risks, benefits and alternatives for the proposed anesthesia with the patient or authorized representative who has indicated his/her understanding and acceptance.   Dental advisory given  Plan Discussed with: CRNA  Anesthesia Plan Comments:        Anesthesia Quick Evaluation

## 2017-06-25 ENCOUNTER — Encounter (HOSPITAL_COMMUNITY): Payer: Self-pay | Admitting: Vascular Surgery

## 2017-06-25 LAB — RENAL FUNCTION PANEL
ALBUMIN: 3.5 g/dL (ref 3.5–5.0)
Anion gap: 14 (ref 5–15)
BUN: 95 mg/dL — AB (ref 6–20)
CO2: 19 mmol/L — ABNORMAL LOW (ref 22–32)
CREATININE: 10.15 mg/dL — AB (ref 0.44–1.00)
Calcium: 9.1 mg/dL (ref 8.9–10.3)
Chloride: 98 mmol/L — ABNORMAL LOW (ref 101–111)
GFR calc Af Amer: 5 mL/min — ABNORMAL LOW (ref >60)
GFR calc non Af Amer: 4 mL/min — ABNORMAL LOW (ref >60)
GLUCOSE: 145 mg/dL — AB (ref 65–99)
PHOSPHORUS: 8.7 mg/dL — AB (ref 2.5–4.6)
POTASSIUM: 4.6 mmol/L (ref 3.5–5.1)
Sodium: 131 mmol/L — ABNORMAL LOW (ref 135–145)

## 2017-06-25 LAB — HEPATITIS B SURFACE ANTIGEN: Hepatitis B Surface Ag: NEGATIVE

## 2017-06-25 LAB — PARATHYROID HORMONE, INTACT (NO CA): PTH: 289 pg/mL — AB (ref 15–65)

## 2017-06-25 NOTE — Progress Notes (Signed)
  Progress Note    06/25/2017 7:58 AM 1 Day Post-Op  Subjective:  Some soreness to L arm in area of incision.  She denies pain, coldness, or loss of ROM in L hand.   Vitals:   06/25/17 0555 06/25/17 0751  BP: (!) 145/90 (!) 174/102  Pulse: 96 94  Resp: 18   Temp: 98.4 F (36.9 C)   SpO2: 99%    Physical Exam: Lungs:  Non labored Incisions:  L arm incision with skin edges approximated well, no hematoma; R chest incision without hematoma or continued bleeding Extremities:  Palpable L radial pulse; palpable thrill and audible bruit L AC fossa Abdomen:  Soft Neurologic: A&O  CBC    Component Value Date/Time   WBC 7.7 06/24/2017 1904   RBC 3.66 (L) 06/24/2017 1904   HGB 9.6 (L) 06/24/2017 1904   HGB 10.5 (L) 02/25/2017 1103   HCT 31.0 (L) 06/24/2017 1904   HCT 35.1 02/25/2017 1103   PLT 209 06/24/2017 1904   PLT 314 02/25/2017 1103   MCV 84.7 06/24/2017 1904   MCV 82 02/25/2017 1103   MCH 26.2 06/24/2017 1904   MCHC 31.0 06/24/2017 1904   RDW 16.6 (H) 06/24/2017 1904   RDW 24.9 (H) 02/25/2017 1103   LYMPHSABS 1.0 06/24/2017 0743   LYMPHSABS 1.0 02/25/2017 1103   MONOABS 0.3 06/24/2017 0743   EOSABS 0.1 06/24/2017 0743   EOSABS 0.1 02/25/2017 1103   BASOSABS 0.0 06/24/2017 0743   BASOSABS 0.0 02/25/2017 1103    BMET    Component Value Date/Time   NA 131 (L) 06/24/2017 1904   NA 142 02/25/2017 1103   K 4.5 06/24/2017 1904   CL 99 (L) 06/24/2017 1904   CO2 16 (L) 06/24/2017 1904   GLUCOSE 142 (H) 06/24/2017 1904   BUN 92 (H) 06/24/2017 1904   BUN 42 (H) 02/25/2017 1103   CREATININE 9.64 (H) 06/24/2017 1904   CALCIUM 8.7 (L) 06/24/2017 1904   GFRNONAA 4 (L) 06/24/2017 1904   GFRAA 5 (L) 06/24/2017 1904    INR    Component Value Date/Time   INR 1.14 06/24/2017 0743     Intake/Output Summary (Last 24 hours) at 06/25/2017 0758 Last data filed at 06/25/2017 0601 Gross per 24 hour  Intake 689.5 ml  Output 0 ml  Net 689.5 ml     Assessment/Plan:  42  y.o. female is s/p L brachiocephalic fistula creation and placement of R IJ PermCath by Dr. Donnetta Hutching 1 Day Post-Op   Patent L brachiocephalic fistula Ok to use R IJ PermCath for HD today Follow up in about 4-6 weeks with fistula duplex in office [place] for discharge from vascular surgery standpoint Office will call patient to arrange follow up   Dagoberto Ligas, PA-C [facility] [phone] 06/25/2017 7:58 AM

## 2017-06-25 NOTE — Progress Notes (Addendum)
PROGRESS NOTE    Madeline Brown  QMG:[id number] DOB: 1975/07/25 DOA: 06/18/2017 PCP: Patient, No Pcp Per   Brief Narrative: Patient is a 42 year old female with past medical history of untreated hypertension, CKD stage III who presented with epistaxis, hypertensive emergency with pulmonary edema and acute kidney injury.  He was found to be 220/ 140 on presentation.  She was admitted to ICU on 1/3 and was transferred to hospital service on 1/6.Renal function continued to worsen.  Patient underwent temporary dialysis catheter placement and AV fistula placement on 06/24/17.  Started on dialysis.  Assessment & Plan:   Active Problems:   Hypertensive emergency   ESRD (end stage renal disease) ([place])   End-stage renal disease on dialysis: Status post temporary dialysis catheter placement and fistula placement.  Nephrology following. Will monitor renal function daily Avoid nephrotoxins  Hypertensive emergency: Blood pressure still on her side.  On carvedilol, clonidine, hydralazine and nifedipine. hold ACE/ARB  Pulmonary edema: Resolved.  Patient saturating normally on room air  Metabolic acidosis: Secondary to renal disease.  On bicarb  Normocytic anemia: Likely secondary to his CKD.  Will monitor H&H.  Discharge planning as per nephrology.  Patient needs outpatient dialysis facility set up.  DVT prophylaxis: SCD Code Status: Full Family Communication: None present on the bedside Disposition Plan: Home   Consultants: Nephrology  Procedures: AV fistula placement, temporary dialysis catheter placement  Antimicrobials:None  Subjective: Patient seen and examined the bedside this morning.  Remains comfortable without any complaints.  Blood pressure still on the higher side  Objective: Vitals:   06/25/17 1030 06/25/17 1100 06/25/17 1121 06/25/17 1229  BP: (!) 157/92 (!) 176/97 (!) 165/97 (!) 171/85  Pulse: 90 98 92 95  Resp:   17 18  Temp:   98.3 F (36.8 C) 98 F (36.7 C)    TempSrc:   Oral Oral  SpO2:   97% 99%  Weight:   75.6 kg (166 lb 10.7 oz)   Height:        Intake/Output Summary (Last 24 hours) at 06/25/2017 1404 Last data filed at 06/25/2017 1121 Gross per 24 hour  Intake 189.5 ml  Output 2000 ml  Net -1810.5 ml   Filed Weights   06/24/17 1151 06/25/17 0921 06/25/17 1121  Weight: 77.1 kg (170 lb) 77.6 kg (171 lb 1.2 oz) 75.6 kg (166 lb 10.7 oz)    Examination:  General exam: Appears calm and comfortable ,Not in distress,average built Respiratory system: Bilateral equal air entry, normal vesicular breath sounds, no wheezes or crackles  Cardiovascular system: S1 & S2 heard, RRR. No JVD, murmurs, rubs, gallops or clicks. No pedal edema, temporary dialysis catheter on the right chest Gastrointestinal system: Abdomen is nondistended, soft and nontender. No organomegaly or masses felt. Normal bowel sounds heard. Central nervous system: Alert and oriented. No focal neurological deficits. Extremities: No edema, no clubbing ,no cyanosis, distal peripheral pulses palpable. AVF on the left arm Skin: No cyanosis,No pallor,No Rash,No Ulcer Psychiatry: Judgement and insight appear normal. Mood & affect appropriate.      Data Reviewed: I have personally reviewed following labs and imaging studies  CBC: Recent Labs  Lab 06/19/17 0045  06/21/17 0321 06/22/17 0319 06/23/17 0528 06/24/17 0743 06/24/17 1904  WBC 5.1   < > 5.8 4.5 3.8* 4.5 7.7  NEUTROABS 3.4  --  4.2 3.1 2.5 3.0  --   HGB 9.8*   < > 8.3* 8.7* 8.4* 9.7* 9.6*  HCT 30.2*   < > 26.8* 27.2* 27.1*  31.4* 31.0*  MCV 83.4   < > 84.3 84.2 84.2 83.5 84.7  PLT 124*   < > 176 163 182 245 209   < > = values in this interval not displayed.   Basic Metabolic Panel: Recent Labs  Lab 06/19/17 0803  06/21/17 0000 06/23/17 0528 06/24/17 0743 06/24/17 1904 06/25/17 0840  NA 131*   < > 129* 133* 133* 131* 131*  K 3.8   < > 4.0 4.0 4.0 4.5 4.6  CL 105   < > 101 100* 99* 99* 98*  CO2 16*   < >  16* 18* 21* 16* 19*  GLUCOSE 136*   < > 123* 121* 132* 142* 145*  BUN 82*   < > 89* 94* 90* 92* 95*  CREATININE 6.75*   < > 8.53* 9.64* 9.67* 9.64* 10.15*  CALCIUM 8.3*   < > 8.3* 8.6* 9.0 8.7* 9.1  MG 1.9  --   --   --   --   --   --   PHOS 4.2  --   --  7.6* 7.4* 8.1* 8.7*   < > = values in this interval not displayed.   GFR: Estimated Creatinine Clearance: 6.7 mL/min (A) (by C-G formula based on SCr of 10.15 mg/dL (H)). Liver Function Tests: Recent Labs  Lab 06/19/17 0045 06/21/17 0321 06/23/17 0528 06/24/17 0743 06/24/17 1904 06/25/17 0840  AST 83* 39  --   --   --   --   ALT 155* 116*  --   --   --   --   ALKPHOS 46 34*  --   --   --   --   BILITOT 0.9 0.6  --   --   --   --   PROT 6.0* 5.1*  --   --   --   --   ALBUMIN 2.9* 2.5* 2.8* 3.3* 3.3* 3.5   No results for input(s): LIPASE, AMYLASE in the last 168 hours. No results for input(s): AMMONIA in the last 168 hours. Coagulation Profile: Recent Labs  Lab 06/19/17 0045 06/24/17 0743  INR 1.07 1.14   Cardiac Enzymes: Recent Labs  Lab 06/19/17 0045  TROPONINI <0.03   BNP (last 3 results) No results for input(s): PROBNP in the last 8760 hours. HbA1C: No results for input(s): HGBA1C in the last 72 hours. CBG: No results for input(s): GLUCAP in the last 168 hours. Lipid Profile: No results for input(s): CHOL, HDL, LDLCALC, TRIG, CHOLHDL, LDLDIRECT in the last 72 hours. Thyroid Function Tests: No results for input(s): TSH, T4TOTAL, FREET4, T3FREE, THYROIDAB in the last 72 hours. Anemia Panel: No results for input(s): VITAMINB12, FOLATE, FERRITIN, TIBC, IRON, RETICCTPCT in the last 72 hours. Sepsis Labs: Recent Labs  Lab 06/19/17 0033  LATICACIDVEN 0.60    Recent Results (from the past 240 hour(s))  MRSA PCR Screening     Status: None   Collection Time: 06/19/17 12:39 PM  Result Value Ref Range Status   MRSA by PCR NEGATIVE NEGATIVE Final    Comment:        The GeneXpert MRSA Assay (FDA approved for  NASAL specimens only), is one component of a comprehensive MRSA colonization surveillance program. It is not intended to diagnose MRSA infection nor to guide or monitor treatment for MRSA infections.   Surgical pcr screen     Status: None   Collection Time: 06/24/17  3:51 AM  Result Value Ref Range Status   MRSA, PCR NEGATIVE NEGATIVE Final  Staphylococcus aureus NEGATIVE NEGATIVE Final    Comment: (NOTE) The Xpert SA Assay (FDA approved for NASAL specimens in patients 41 years of age and older), is one component of a comprehensive surveillance program. It is not intended to diagnose infection nor to guide or monitor treatment.          Radiology Studies: Dg Chest Port 1 View  Result Date: 06/24/2017 CLINICAL DATA:  Dialysis catheter placement EXAM: PORTABLE CHEST 1 VIEW COMPARISON:  06/20/2017 FINDINGS: 1508 hours. The cardio pericardial silhouette is enlarged. Right dialysis catheter is new in the interval with tip overlying the mid SVC. No pneumothorax. Lungs clear bilaterally. No pleural effusion. IMPRESSION: Right IJ dialysis catheter tip overlies the mid right atrium without evidence for pneumothorax. Electronically Signed   By: Misty Stanley M.D.   On: 06/24/2017 15:47   Dg Fluoro Guide Cv Line-no Report  Result Date: 06/24/2017 Fluoroscopy was utilized by the requesting physician.  No radiographic interpretation.        Scheduled Meds: . carvedilol  25 mg Oral BID WC  . cloNIDine  0.2 mg Oral Q8H  . darbepoetin (ARANESP) injection - DIALYSIS  60 mcg Intravenous Weekly  . hydrALAZINE  100 mg Oral Q8H  . NIFEdipine  90 mg Oral QHS  . pantoprazole  40 mg Oral Q1200   Continuous Infusions: . sodium chloride    . sodium chloride 10 mL/hr at 06/24/17 1204  . ferric gluconate (FERRLECIT/NULECIT) IV 125 mg (06/25/17 1018)  . niCARDipine Stopped (06/20/17 0715)     LOS: 6 days    Time spent: 25 minutes    Adaleah Forget Jodie Echevaria, MD [facility] Pager  [phone]  If 7PM-7AM, please contact night-coverage [email or web address] Password [email or web address] 06/25/2017, 2:04 PM

## 2017-06-25 NOTE — Op Note (Signed)
    OPERATIVE REPORT  DATE OF SURGERY: 06/25/2017  PATIENT: Madeline Brown, 42 y.o. female MRN: [id number]  DOB: 10-11-1975  PRE-OPERATIVE DIAGNOSIS: End-stage renal disease  POST-OPERATIVE DIAGNOSIS:  Same  PROCEDURE: #1 right IJ hemodialysis catheter with SonoSite visualization, #2 left brachiocephalic AV fistula creation  SURGEON:  Curt Jews, M.D.  PHYSICIAN ASSISTANT: Gerri Lins PA-C  ANESTHESIA: General  EBL: Minimal ml  No intake/output data recorded.  BLOOD ADMINISTERED: None  DRAINS: None  SPECIMEN: None  COUNTS CORRECT:  YES  PLAN OF CARE: PACU  PATIENT DISPOSITION:  PACU - hemodynamically stable  PROCEDURE DETAILS: The patient was taken to the operating placed in supine position where the area of the right and left neck were imaged with SonoSite ultrasound.  Patient had widely patent internal jugular veins.  The patient was placed in Trendelenburg position and using SonoSite visualization of the right internal jugular vein was accessed at the base of the neck with an 18-gauge needle.  A guidewire was passed down to the level of the right atrium and this was confirmed with fluoroscopy.  A dilator and peel-away sheath was passed over the guidewire and the dilator.  Were removed.  The catheter was placed through the peel-away sheath and the peel-away sheath was removed.  The catheter tips were positioned at the level of the distal right atrium.  The catheter was brought through a subcutaneous tunnel through a separate stab incision and the 2 lm ports were attached.  Both lumens flushed and aspirated easily and were locked with 1000 unit/cc heparin.  The catheter was secured to the skin with a 309 stitch and the entry site was closed with a 4-0 subcuticular Vicryl stitch.  Attention was then turned to the left arm.  Preoperative vein map suggested marginal veins for fistula creation.  I reimaged the veins and the patient did have a good caliber antecubital vein and  patency of her cephalic vein throughout her upper arm.  Incision was made over the antecubital space and carried down to isolate the antecubital vein which was the cephalic vein.  This was a very good caliber.  Tributary branches were ligated proximally distally and divided.  The brachial artery was exposed to the same incision and was of large caliber.  The vein was ligated distally and divided and was mobilized to the level of the brachial artery was occluded proximally and distally and was opened with an extended longitudinally with Potts scissors.  The vein was cut to the appropriate length and was spatulated and sewn end-to-side to the artery with a running 6-0 Prolene suture.  Clamps were removed and thrill was noted.  The patient continued to have a palpable radial pulse.  The incision was closed with a running 3-0 Vicryl in the subcutaneous and subcuticular tissue.  Sterile dressing was applied.  The patient was transferred to the recovery room in stable condition with chest x-ray pending   Rosetta Posner, M.D., [facility] 06/25/2017 10:12 AM

## 2017-06-25 NOTE — Progress Notes (Signed)
[facility] ROUNDING NOTE   Subjective:      [name] with hx/o Malignant HTN with TMA admitted with HTN emergency and  Appears to now be end stage renal disease  She has uremic odor and asterixis   Creatinine does not seem to be improving My guess is that this final episode of malignant hypertension has worsened her already tenuous renal function We will initiate dialysis     Objective:  Vital signs in last 24 hours:  Temp:  [97.7 F (36.5 C)-99.2 F (37.3 C)] 98.3 F (36.8 C) (01/10 0921) Pulse Rate:  [87-104] 98 (01/10 0930) Resp:  [15-20] 20 (01/10 0921) BP: (145-201)/(90-109) 175/98 (01/10 0930) SpO2:  [92 %-99 %] 99 % (01/10 0921) Weight:  [170 lb (77.1 kg)-171 lb 1.2 oz (77.6 kg)] 171 lb 1.2 oz (77.6 kg) (01/10 0921)  Weight change: -1.6 oz (-0.045 kg) Filed Weights   06/23/17 2202 06/24/17 1151 06/25/17 0921  Weight: 170 lb 1.6 oz (77.2 kg) 170 lb (77.1 kg) 171 lb 1.2 oz (77.6 kg)    Intake/Output: I/O last 3 completed shifts: In: 689.5 [I.V.:639.5; Other:50] Out: 0    Intake/Output this shift:  No intake/output data recorded.  CVS- RRR no rub RS- CTA ABD- BS present soft non-distended EXT- no edema asterixes Basic Metabolic Panel: Recent Labs  Lab 06/19/17 0803 06/20/17 1103 06/21/17 0000 06/23/17 0528 06/24/17 0743 06/24/17 1904  NA 131* 131* 129* 133* 133* 131*  K 3.8 4.0 4.0 4.0 4.0 4.5  CL 105 105 101 100* 99* 99*  CO2 16* 15* 16* 18* 21* 16*  GLUCOSE 136* 167* 123* 121* 132* 142*  BUN 82* 81* 89* 94* 90* 92*  CREATININE 6.75* 7.81* 8.53* 9.64* 9.67* 9.64*  CALCIUM 8.3* 8.1* 8.3* 8.6* 9.0 8.7*  MG 1.9  --   --   --   --   --   PHOS 4.2  --   --  7.6* 7.4* 8.1*    Liver Function Tests: Recent Labs  Lab 06/19/17 0045 06/21/17 0321 06/23/17 0528 06/24/17 0743 06/24/17 1904  AST 83* 39  --   --   --   ALT 155* 116*  --   --   --   ALKPHOS 46 34*  --   --   --   BILITOT 0.9 0.6  --   --   --   PROT 6.0* 5.1*  --   --   --    ALBUMIN 2.9* 2.5* 2.8* 3.3* 3.3*   No results for input(s): LIPASE, AMYLASE in the last 168 hours. No results for input(s): AMMONIA in the last 168 hours.  CBC: Recent Labs  Lab 06/19/17 0045  06/21/17 0321 06/22/17 0319 06/23/17 0528 06/24/17 0743 06/24/17 1904  WBC 5.1   < > 5.8 4.5 3.8* 4.5 7.7  NEUTROABS 3.4  --  4.2 3.1 2.5 3.0  --   HGB 9.8*   < > 8.3* 8.7* 8.4* 9.7* 9.6*  HCT 30.2*   < > 26.8* 27.2* 27.1* 31.4* 31.0*  MCV 83.4   < > 84.3 84.2 84.2 83.5 84.7  PLT 124*   < > 176 163 182 245 209   < > = values in this interval not displayed.    Cardiac Enzymes: Recent Labs  Lab 06/19/17 0045  TROPONINI <0.03    BNP: Invalid input(s): POCBNP  CBG: No results for input(s): GLUCAP in the last 168 hours.  Microbiology: Results for orders placed or performed during the hospital encounter of  06/18/17  MRSA PCR Screening     Status: None   Collection Time: 06/19/17 12:39 PM  Result Value Ref Range Status   MRSA by PCR NEGATIVE NEGATIVE Final    Comment:        The GeneXpert MRSA Assay (FDA approved for NASAL specimens only), is one component of a comprehensive MRSA colonization surveillance program. It is not intended to diagnose MRSA infection nor to guide or monitor treatment for MRSA infections.   Surgical pcr screen     Status: None   Collection Time: 06/24/17  3:51 AM  Result Value Ref Range Status   MRSA, PCR NEGATIVE NEGATIVE Final   Staphylococcus aureus NEGATIVE NEGATIVE Final    Comment: (NOTE) The Xpert SA Assay (FDA approved for NASAL specimens in patients 36 years of age and older), is one component of a comprehensive surveillance program. It is not intended to diagnose infection nor to guide or monitor treatment.     Coagulation Studies: Recent Labs    06/24/17 0743  LABPROT 14.5  INR 1.14    Urinalysis: No results for input(s): COLORURINE, LABSPEC, PHURINE, GLUCOSEU, HGBUR, BILIRUBINUR, KETONESUR, PROTEINUR, UROBILINOGEN,  NITRITE, LEUKOCYTESUR in the last 72 hours.  Invalid input(s): APPERANCEUR    Imaging: Dg Chest Port 1 View  Result Date: 06/24/2017 CLINICAL DATA:  Dialysis catheter placement EXAM: PORTABLE CHEST 1 VIEW COMPARISON:  06/20/2017 FINDINGS: 1508 hours. The cardio pericardial silhouette is enlarged. Right dialysis catheter is new in the interval with tip overlying the mid SVC. No pneumothorax. Lungs clear bilaterally. No pleural effusion. IMPRESSION: Right IJ dialysis catheter tip overlies the mid right atrium without evidence for pneumothorax. Electronically Signed   By: Misty Stanley M.D.   On: 06/24/2017 15:47   Dg Fluoro Guide Cv Line-no Report  Result Date: 06/24/2017 Fluoroscopy was utilized by the requesting physician.  No radiographic interpretation.     Medications:   . sodium chloride    . sodium chloride    . sodium chloride    . sodium chloride 10 mL/hr at 06/24/17 1204  . ferric gluconate (FERRLECIT/NULECIT) IV Stopped (06/23/17 1450)  . niCARDipine Stopped (06/20/17 0715)   . carvedilol  25 mg Oral BID WC  . cloNIDine  0.2 mg Oral Q8H  . darbepoetin (ARANESP) injection - DIALYSIS  60 mcg Intravenous Weekly  . hydrALAZINE  100 mg Oral Q8H  . NIFEdipine  90 mg Oral QHS  . pantoprazole  40 mg Oral Q1200   sodium chloride, sodium chloride, sodium chloride, acetaminophen, alteplase, heparin, Influenza vac split quadrivalent PF, labetalol, lidocaine (PF), lidocaine-prilocaine, oxyCODONE-acetaminophen, pentafluoroprop-tetrafluoroeth, pneumococcal 23 valent vaccine, simethicone  Assessment/ Plan:  1. AoCKD3 ? 2/2 malignant HTN now with progression 1. BL SCr  Progressed to stage 5 with malignant hypertension ; sees Patel at [facility] of permanent access per [facility]  Appreciate Dr Early   2.  uremic;Fetor but very low GFR; discussed might need HD during this admissioninterested in PD will have placement of permanent access as a back up plan will also start hemodialysis clip  process  3. Renal US last year with increased echogenicity and R 72mm stone, don't think needs repeating w/o renal colic and adequate UOP 2. Uncontrolled HTN with HTN emergency 1. Improved with restart of outpt regimen, holding ACEi 2. Hypervoelmic on exam, will start dialysis 3. Nonadherence to BP meds as outpt, partially from cost 4. Stop sodium bicarbonate 5. Stop lasix as starting dialysis       LOS: 6 Ashad Fawbush W @TODAY @9 :51  AM

## 2017-06-26 ENCOUNTER — Other Ambulatory Visit: Payer: Self-pay

## 2017-06-26 DIAGNOSIS — N186 End stage renal disease: Secondary | ICD-10-CM

## 2017-06-26 DIAGNOSIS — Z48812 Encounter for surgical aftercare following surgery on the circulatory system: Secondary | ICD-10-CM

## 2017-06-26 LAB — RENAL FUNCTION PANEL
ALBUMIN: 3.3 g/dL — AB (ref 3.5–5.0)
ANION GAP: 14 (ref 5–15)
BUN: 67 mg/dL — ABNORMAL HIGH (ref 6–20)
CALCIUM: 8.8 mg/dL — AB (ref 8.9–10.3)
CO2: 21 mmol/L — ABNORMAL LOW (ref 22–32)
Chloride: 99 mmol/L — ABNORMAL LOW (ref 101–111)
Creatinine, Ser: 8.14 mg/dL — ABNORMAL HIGH (ref 0.44–1.00)
GFR, EST AFRICAN AMERICAN: 6 mL/min — AB (ref >60)
GFR, EST NON AFRICAN AMERICAN: 6 mL/min — AB (ref >60)
Glucose, Bld: 121 mg/dL — ABNORMAL HIGH (ref 65–99)
PHOSPHORUS: 6.7 mg/dL — AB (ref 2.5–4.6)
Potassium: 4 mmol/L (ref 3.5–5.1)
SODIUM: 134 mmol/L — AB (ref 135–145)

## 2017-06-26 LAB — HEPATITIS B CORE ANTIBODY, TOTAL: Hep B Core Total Ab: POSITIVE — AB

## 2017-06-26 LAB — HEPATITIS B SURFACE ANTIBODY,QUALITATIVE: Hep B S Ab: REACTIVE

## 2017-06-26 MED ORDER — DOCUSATE SODIUM 100 MG PO CAPS
100.0000 mg | ORAL_CAPSULE | Freq: Two times a day (BID) | ORAL | Status: DC
  Administered 2017-06-26 – 2017-06-27 (×3): 100 mg via ORAL
  Filled 2017-06-26 (×3): qty 1

## 2017-06-26 NOTE — Progress Notes (Signed)
PROGRESS NOTE    Madeline Brown  CLE:[id number] DOB: 31-Jul-1975 DOA: 06/18/2017 PCP: Patient, No Pcp Per   Brief Narrative: Patient is a 42 year old female with past medical history of untreated hypertension, CKD stage III who presented with epistaxis, hypertensive emergency with pulmonary edema and acute kidney injury.  He was found to be 220/ 140 on presentation.  She was admitted to ICU on 1/3 and was transferred to hospital service on 1/6.Renal function continued to worsen.  Patient underwent temporary dialysis catheter placement and AV fistula placement on 06/24/17.  Started on dialysis.  Assessment & Plan:   Active Problems:   Hypertensive emergency   ESRD (end stage renal disease) ([place])   End-stage renal disease on dialysis: Status post temporary dialysis catheter placement and fistula placement.  Nephrology following. Will monitor renal function daily Avoid nephrotoxins  Hypertensive emergency: Blood pressure still on higher side.  On carvedilol, clonidine, hydralazine and nifedipine. hold ACE/ARB.  She was also ordered nicardipine drip.  Pulmonary edema: Resolved.  Patient saturating normally on room air  Metabolic acidosis: Secondary to renal disease.    Normocytic anemia: Likely secondary to his CKD.  Will monitor H&H.  Discharge planning as per nephrology.  Patient needs outpatient dialysis facility set up.  DVT prophylaxis: SCD Code Status: Full Family Communication: None present on the bedside Disposition Plan: Home   Consultants: Nephrology  Procedures: AV fistula placement, temporary dialysis catheter placement  Antimicrobials:None  Subjective: Patient seen and examined the dialysis bed.  Remains comfortable.  Still hypertensive  Objective: Vitals:   06/26/17 1030 06/26/17 1100 06/26/17 1130 06/26/17 1150  BP: (!) 178/98 (!) 176/97 (!) 174/100 (!) 172/98  Pulse: 93 96 97 98  Resp: 17 18 17 18   Temp:    98.3 F (36.8 C)  TempSrc:    Oral  SpO2:    95%    Weight:    73.7 kg (162 lb 7.7 oz)  Height:        Intake/Output Summary (Last 24 hours) at 06/26/2017 1433 Last data filed at 06/26/2017 1150 Gross per 24 hour  Intake 950 ml  Output 2000 ml  Net -1050 ml   Filed Weights   06/26/17 0231 06/26/17 0905 06/26/17 1150  Weight: 75 kg (165 lb 5.5 oz) 75.7 kg (166 lb 14.2 oz) 73.7 kg (162 lb 7.7 oz)    Examination:  General exam: Appears calm and comfortable ,Not in distress,average built Respiratory system: Bilateral equal air entry, normal vesicular breath sounds, no wheezes or crackles  Cardiovascular system: S1 & S2 heard, RRR. No JVD, murmurs, rubs, gallops or clicks. No pedal edema, temporary dialysis catheter on the right chest Gastrointestinal system: Abdomen is nondistended, soft and nontender. No organomegaly or masses felt. Normal bowel sounds heard. Central nervous system: Alert and oriented. No focal neurological deficits. Extremities: No edema, no clubbing ,no cyanosis, distal peripheral pulses palpable, AV fistula on the left arm Skin: No cyanosis,No pallor,No Rash,No Ulcer Psychiatry: Judgement and insight appear normal. Mood & affect appropriate.      Data Reviewed: I have personally reviewed following labs and imaging studies  CBC: Recent Labs  Lab 06/21/17 0321 06/22/17 0319 06/23/17 0528 06/24/17 0743 06/24/17 1904  WBC 5.8 4.5 3.8* 4.5 7.7  NEUTROABS 4.2 3.1 2.5 3.0  --   HGB 8.3* 8.7* 8.4* 9.7* 9.6*  HCT 26.8* 27.2* 27.1* 31.4* 31.0*  MCV 84.3 84.2 84.2 83.5 84.7  PLT 176 163 182 245 944   Basic Metabolic Panel: Recent Labs  Lab  06/23/17 0528 06/24/17 0743 06/24/17 1904 06/25/17 0840 06/26/17 0427  NA 133* 133* 131* 131* 134*  K 4.0 4.0 4.5 4.6 4.0  CL 100* 99* 99* 98* 99*  CO2 18* 21* 16* 19* 21*  GLUCOSE 121* 132* 142* 145* 121*  BUN 94* 90* 92* 95* 67*  CREATININE 9.64* 9.67* 9.64* 10.15* 8.14*  CALCIUM 8.6* 9.0 8.7* 9.1 8.8*  PHOS 7.6* 7.4* 8.1* 8.7* 6.7*   GFR: Estimated Creatinine  Clearance: 8.2 mL/min (A) (by C-G formula based on SCr of 8.14 mg/dL (H)). Liver Function Tests: Recent Labs  Lab 06/21/17 0321 06/23/17 0528 06/24/17 0743 06/24/17 1904 06/25/17 0840 06/26/17 0427  AST 39  --   --   --   --   --   ALT 116*  --   --   --   --   --   ALKPHOS 34*  --   --   --   --   --   BILITOT 0.6  --   --   --   --   --   PROT 5.1*  --   --   --   --   --   ALBUMIN 2.5* 2.8* 3.3* 3.3* 3.5 3.3*   No results for input(s): LIPASE, AMYLASE in the last 168 hours. No results for input(s): AMMONIA in the last 168 hours. Coagulation Profile: Recent Labs  Lab 06/24/17 0743  INR 1.14   Cardiac Enzymes: No results for input(s): CKTOTAL, CKMB, CKMBINDEX, TROPONINI in the last 168 hours. BNP (last 3 results) No results for input(s): PROBNP in the last 8760 hours. HbA1C: No results for input(s): HGBA1C in the last 72 hours. CBG: No results for input(s): GLUCAP in the last 168 hours. Lipid Profile: No results for input(s): CHOL, HDL, LDLCALC, TRIG, CHOLHDL, LDLDIRECT in the last 72 hours. Thyroid Function Tests: No results for input(s): TSH, T4TOTAL, FREET4, T3FREE, THYROIDAB in the last 72 hours. Anemia Panel: No results for input(s): VITAMINB12, FOLATE, FERRITIN, TIBC, IRON, RETICCTPCT in the last 72 hours. Sepsis Labs: No results for input(s): PROCALCITON, LATICACIDVEN in the last 168 hours.  Recent Results (from the past 240 hour(s))  MRSA PCR Screening     Status: None   Collection Time: 06/19/17 12:39 PM  Result Value Ref Range Status   MRSA by PCR NEGATIVE NEGATIVE Final    Comment:        The GeneXpert MRSA Assay (FDA approved for NASAL specimens only), is one component of a comprehensive MRSA colonization surveillance program. It is not intended to diagnose MRSA infection nor to guide or monitor treatment for MRSA infections.   Surgical pcr screen     Status: None   Collection Time: 06/24/17  3:51 AM  Result Value Ref Range Status   MRSA, PCR  NEGATIVE NEGATIVE Final   Staphylococcus aureus NEGATIVE NEGATIVE Final    Comment: (NOTE) The Xpert SA Assay (FDA approved for NASAL specimens in patients 74 years of age and older), is one component of a comprehensive surveillance program. It is not intended to diagnose infection nor to guide or monitor treatment.          Radiology Studies: Dg Chest Port 1 View  Result Date: 06/24/2017 CLINICAL DATA:  Dialysis catheter placement EXAM: PORTABLE CHEST 1 VIEW COMPARISON:  06/20/2017 FINDINGS: 1508 hours. The cardio pericardial silhouette is enlarged. Right dialysis catheter is new in the interval with tip overlying the mid SVC. No pneumothorax. Lungs clear bilaterally. No pleural effusion. IMPRESSION: Right IJ dialysis  catheter tip overlies the mid right atrium without evidence for pneumothorax. Electronically Signed   By: Misty Stanley M.D.   On: 06/24/2017 15:47        Scheduled Meds: . carvedilol  25 mg Oral BID WC  . cloNIDine  0.2 mg Oral Q8H  . darbepoetin (ARANESP) injection - DIALYSIS  60 mcg Intravenous Weekly  . docusate sodium  100 mg Oral BID  . hydrALAZINE  100 mg Oral Q8H  . NIFEdipine  90 mg Oral QHS  . pantoprazole  40 mg Oral Q1200   Continuous Infusions: . sodium chloride    . sodium chloride 10 mL/hr at 06/24/17 1204  . ferric gluconate (FERRLECIT/NULECIT) IV Stopped (06/26/17 1227)  . niCARDipine Stopped (06/20/17 0715)     LOS: 7 days    Time spent: 25 minutes    Babatunde Seago Jodie Echevaria, MD [facility] Pager [phone]  If 7PM-7AM, please contact night-coverage [email or web address] Password [facility] 06/26/2017, 2:33 PM

## 2017-06-26 NOTE — Progress Notes (Signed)
Accepted at [facility] .1st treatment Tuesday,January 15,2019 at 3 :17.Schedule and chairtime Tuesday,Thursday,Saturday at 12:00pm 2nd

## 2017-06-26 NOTE — Progress Notes (Signed)
[facility] ROUNDING NOTE   Subjective:      [name] with hx/o Malignant HTN with TMA admitted with HTN emergency and  Appears to now be end stage renal disease  She had uremic odor and asterixis   Creatinine does not seem to be improving My guess is that this final episode of malignant hypertension has worsened her already tenuous renal function  Continues to do well on dialysis    CLIP in progress     Objective:  Vital signs in last 24 hours:  Temp:  [98 F (36.7 C)-99.1 F (37.3 C)] 98.5 F (36.9 C) (01/11 0905) Pulse Rate:  [87-103] 96 (01/11 1100) Resp:  [16-18] 18 (01/11 1100) BP: (152-181)/(85-103) 176/97 (01/11 1100) SpO2:  [96 %-100 %] 97 % (01/11 0905) Weight:  [165 lb 5.5 oz (75 kg)-166 lb 14.2 oz (75.7 kg)] 166 lb 14.2 oz (75.7 kg) (01/11 0905)  Weight change: 1 lb 1.2 oz (0.488 kg) Filed Weights   06/25/17 2201 06/26/17 0231 06/26/17 0905  Weight: 165 lb 5.5 oz (75 kg) 165 lb 5.5 oz (75 kg) 166 lb 14.2 oz (75.7 kg)    Intake/Output: I/O last 3 completed shifts: In: 969.5 [P.O.:480; I.V.:379.5; IV Piggyback:110] Out: 2000 [Other:2000]   Intake/Output this shift:  No intake/output data recorded.  CVS- RRR no rub RS- CTA ABD- BS present soft non-distended EXT- no edema asterixes Basic Metabolic Panel: Recent Labs  Lab 06/23/17 0528 06/24/17 0743 06/24/17 1904 06/25/17 0840 06/26/17 0427  NA 133* 133* 131* 131* 134*  K 4.0 4.0 4.5 4.6 4.0  CL 100* 99* 99* 98* 99*  CO2 18* 21* 16* 19* 21*  GLUCOSE 121* 132* 142* 145* 121*  BUN 94* 90* 92* 95* 67*  CREATININE 9.64* 9.67* 9.64* 10.15* 8.14*  CALCIUM 8.6* 9.0 8.7* 9.1 8.8*  PHOS 7.6* 7.4* 8.1* 8.7* 6.7*    Liver Function Tests: Recent Labs  Lab 06/21/17 0321 06/23/17 0528 06/24/17 0743 06/24/17 1904 06/25/17 0840 06/26/17 0427  AST 39  --   --   --   --   --   ALT 116*  --   --   --   --   --   ALKPHOS 34*  --   --   --   --   --   BILITOT 0.6  --   --   --   --   --    PROT 5.1*  --   --   --   --   --   ALBUMIN 2.5* 2.8* 3.3* 3.3* 3.5 3.3*   No results for input(s): LIPASE, AMYLASE in the last 168 hours. No results for input(s): AMMONIA in the last 168 hours.  CBC: Recent Labs  Lab 06/21/17 0321 06/22/17 0319 06/23/17 0528 06/24/17 0743 06/24/17 1904  WBC 5.8 4.5 3.8* 4.5 7.7  NEUTROABS 4.2 3.1 2.5 3.0  --   HGB 8.3* 8.7* 8.4* 9.7* 9.6*  HCT 26.8* 27.2* 27.1* 31.4* 31.0*  MCV 84.3 84.2 84.2 83.5 84.7  PLT 176 163 182 245 209    Cardiac Enzymes: No results for input(s): CKTOTAL, CKMB, CKMBINDEX, TROPONINI in the last 168 hours.  BNP: Invalid input(s): POCBNP  CBG: No results for input(s): GLUCAP in the last 168 hours.  Microbiology: Results for orders placed or performed during the hospital encounter of 06/18/17  MRSA PCR Screening     Status: None   Collection Time: 06/19/17 12:39 PM  Result Value Ref Range Status   MRSA by PCR  NEGATIVE NEGATIVE Final    Comment:        The GeneXpert MRSA Assay (FDA approved for NASAL specimens only), is one component of a comprehensive MRSA colonization surveillance program. It is not intended to diagnose MRSA infection nor to guide or monitor treatment for MRSA infections.   Surgical pcr screen     Status: None   Collection Time: 06/24/17  3:51 AM  Result Value Ref Range Status   MRSA, PCR NEGATIVE NEGATIVE Final   Staphylococcus aureus NEGATIVE NEGATIVE Final    Comment: (NOTE) The Xpert SA Assay (FDA approved for NASAL specimens in patients 35 years of age and older), is one component of a comprehensive surveillance program. It is not intended to diagnose infection nor to guide or monitor treatment.     Coagulation Studies: Recent Labs    06/24/17 0743  LABPROT 14.5  INR 1.14    Urinalysis: No results for input(s): COLORURINE, LABSPEC, PHURINE, GLUCOSEU, HGBUR, BILIRUBINUR, KETONESUR, PROTEINUR, UROBILINOGEN, NITRITE, LEUKOCYTESUR in the last 72 hours.  Invalid  input(s): APPERANCEUR    Imaging: Dg Chest Port 1 View  Result Date: 06/24/2017 CLINICAL DATA:  Dialysis catheter placement EXAM: PORTABLE CHEST 1 VIEW COMPARISON:  06/20/2017 FINDINGS: 1508 hours. The cardio pericardial silhouette is enlarged. Right dialysis catheter is new in the interval with tip overlying the mid SVC. No pneumothorax. Lungs clear bilaterally. No pleural effusion. IMPRESSION: Right IJ dialysis catheter tip overlies the mid right atrium without evidence for pneumothorax. Electronically Signed   By: Misty Stanley M.D.   On: 06/24/2017 15:47   Dg Fluoro Guide Cv Line-no Report  Result Date: 06/24/2017 Fluoroscopy was utilized by the requesting physician.  No radiographic interpretation.     Medications:   . sodium chloride    . sodium chloride 10 mL/hr at 06/24/17 1204  . ferric gluconate (FERRLECIT/NULECIT) IV 125 mg (06/26/17 1050)  . niCARDipine Stopped (06/20/17 0715)   . carvedilol  25 mg Oral BID WC  . cloNIDine  0.2 mg Oral Q8H  . darbepoetin (ARANESP) injection - DIALYSIS  60 mcg Intravenous Weekly  . docusate sodium  100 mg Oral BID  . hydrALAZINE  100 mg Oral Q8H  . NIFEdipine  90 mg Oral QHS  . pantoprazole  40 mg Oral Q1200   sodium chloride, acetaminophen, Influenza vac split quadrivalent PF, labetalol, oxyCODONE-acetaminophen, pneumococcal 23 valent vaccine, simethicone  Assessment/ Plan:  1. AoCKD3 ? 2/2 malignant HTN now with progression 1. BL SCr  Progressed to stage 5 with malignant hypertension ; sees Patel at [place] of permanent access per [facility]  Appreciate Dr Donnetta Hutching    HD #2  Dialysis planned for tomorrow  CLIP process  2.  uremic;Fetor but very low GFRinterested in PD  however started HD   hemodialysis clip process  2. HTN  Improved with ultrafiltration 3. Nonadherence to BP meds as outpt, partially from cost 4. Stop sodium bicarbonate 5. Stop lasix as starting dialysis       LOS: 7 Madeline Brown W @TODAY @11 :11 AM

## 2017-06-26 NOTE — Clinical Social Work Note (Signed)
Madeline Brown is a new dialysis patient and has been set-up at a dialysis center. CSW talked with patient and provided SCAT application. Professional Verification Section completed by CSW and also given to patient. Madeline Brown was instructed to meet with SW at dialysis center after her first treatment so that her SCAT application can be faxed to SCAT eligibility office. Patient reported that she has transportation for the first week. CSW signing off as CSW intervention provided and no other SW services needed at this time.  Sahiti Joswick Givens, MSW, LCSW Licensed Clinical Social Worker [place] [phone]

## 2017-06-27 DIAGNOSIS — N186 End stage renal disease: Secondary | ICD-10-CM

## 2017-06-27 LAB — RENAL FUNCTION PANEL
ALBUMIN: 3.1 g/dL — AB (ref 3.5–5.0)
Anion gap: 11 (ref 5–15)
BUN: 44 mg/dL — AB (ref 6–20)
CHLORIDE: 97 mmol/L — AB (ref 101–111)
CO2: 25 mmol/L (ref 22–32)
CREATININE: 6.35 mg/dL — AB (ref 0.44–1.00)
Calcium: 8.9 mg/dL (ref 8.9–10.3)
GFR calc Af Amer: 9 mL/min — ABNORMAL LOW (ref >60)
GFR calc non Af Amer: 7 mL/min — ABNORMAL LOW (ref >60)
GLUCOSE: 117 mg/dL — AB (ref 65–99)
PHOSPHORUS: 5.5 mg/dL — AB (ref 2.5–4.6)
Potassium: 3.7 mmol/L (ref 3.5–5.1)
Sodium: 133 mmol/L — ABNORMAL LOW (ref 135–145)

## 2017-06-27 MED ORDER — CLONIDINE HCL 0.2 MG PO TABS: 0.2000 mg | ORAL_TABLET | Freq: Three times a day (TID) | ORAL | 0 refills | Status: DC

## 2017-06-27 MED ORDER — CARVEDILOL 25 MG PO TABS: 25.0000 mg | ORAL_TABLET | Freq: Two times a day (BID) | ORAL | 0 refills | Status: DC

## 2017-06-27 MED ORDER — FERROUS SULFATE 325 (65 FE) MG PO TABS: 325.0000 mg | ORAL_TABLET | Freq: Two times a day (BID) | ORAL | 0 refills | Status: DC

## 2017-06-27 MED ORDER — NIFEDIPINE ER OSMOTIC RELEASE 90 MG PO TB24: 90.0000 mg | ORAL_TABLET | Freq: Every day | ORAL | 0 refills | Status: DC

## 2017-06-27 MED ORDER — HYDRALAZINE HCL 100 MG PO TABS: 100.0000 mg | ORAL_TABLET | Freq: Three times a day (TID) | ORAL | 0 refills | Status: DC

## 2017-06-27 NOTE — Progress Notes (Signed)
Patient Discharge: Disposition: Patient discharged to home. Education: Reviewed medications, prescriptions, discharge instructions, and follow-up appointments, verbalized understanding. IV: Discontinued IV before discharge. Telemetry: Discontinued Tele before discharge. Transportation: Patient escorted out of the unit in w/c till the ride. Belongings: Patient took all her belongings with her.

## 2017-06-27 NOTE — Discharge Summary (Signed)
Physician Discharge Summary  Madeline Brown KYH:[id number] DOB: Aug 18, 1975 DOA: 06/18/2017  PCP: Patient, No Pcp Per  Admit date: 06/18/2017 Discharge date: 06/27/2017  Admitted From: Home Disposition:  Home Recommendations for Outpatient Follow-up:   1) Follow up with your PCP in a week. 2) Follow up with outpatient dialysis center. 3) Monitor your blood pressure at home. 4) Take prescribed medications as instructe  Discharge Condition: Stable CODE STATUS: Full Diet recommendation: Renal  Brief/Interim Summary:  Patient is a 42 year old female with past medical history of untreated hypertension, CKD stage III who presented with epistaxis, hypertensive emergency with pulmonary edema and acute kidney injury.  He was found to be 220/ 140 on presentation.  She was admitted to ICU on 1/3 and was transferred to hospital service on 1/6.Renal function continued to worsen.Nephrology evaluated the patient.  Patient underwent temporary dialysis catheter placement and AV fistula placement on 06/24/17.  Started on dialysis. Outpatient dialysis facility has been established for her at [facility] .1st treatment Tuesday,January 15,2019 at 47 :17.Schedule will be [place]. She will be discharged today to home.  Following problems were addressed during hospitalization:  End-stage renal disease on dialysis: Status post temporary dialysis catheter placement and fistula placement. Started dialysis here. Nephrology was following.  Hypertensive emergency: Blood pressure still on higher side.  On carvedilol, clonidine, hydralazine and nifedipine. hold ACE/ARB.    Blood pressure at home.  Pulmonary edema: Resolved.  Patient saturating normally on room air  Metabolic acidosis: Secondary to renal disease.    Normocytic anemia: Likely secondary to his CKD.  Continue iron supplements     Discharge Diagnoses:  Active Problems:   Hypertensive emergency   ESRD (end  stage renal disease) [facility])    Discharge Instructions  Discharge Instructions    Diet - low sodium heart healthy   Complete by:  As directed    Discharge instructions   Complete by:  As directed    1) Follow up with your PCP in a week. 2) Follow up with outpatient dialysis center. 3) Monitor your blood pressure at home. 4) Take prescribed medications as instructed   Increase activity slowly   Complete by:  As directed      Allergies as of 06/27/2017      Reactions   Visine [tetrahydrozoline Hcl] Other (See Comments)   Eyes Swelling.      Medication List    STOP taking these medications   furosemide 80 MG tablet Commonly known as:  LASIX   LISINOPRIL PO   potassium chloride SA 20 MEQ tablet Commonly known as:  K-DUR,KLOR-CON     TAKE these medications   carvedilol 25 MG tablet Commonly known as:  COREG Take 1 tablet (25 mg total) by mouth 2 (two) times daily with a meal.   cloNIDine 0.2 MG tablet Commonly known as:  CATAPRES Take 1 tablet (0.2 mg total) by mouth 3 (three) times daily.   ferrous sulfate 325 (65 FE) MG tablet Take 1 tablet (325 mg total) by mouth 2 (two) times daily with a meal.   GOODY HEADACHE PO Take 1 packet by mouth daily as needed. For cramping   hydrALAZINE 100 MG tablet Commonly known as:  APRESOLINE Take 1 tablet (100 mg total) by mouth every 8 (eight) hours.   NIFEdipine 90 MG 24 hr tablet Commonly known as:  PROCARDIA XL/ADALAT-CC Take 1 tablet (90 mg total) by mouth at bedtime.   traMADol 50 MG tablet Commonly known as:  ULTRAM Take 1  tablet (50 mg total) by mouth every 6 (six) hours as needed.      Follow-up Information    Early, Arvilla Meres, MD Follow up in 5 week(s).   Specialties:  Vascular Surgery, Cardiology Why:  Office will call Contact information: [address] [place]  [address] [phone]          Allergies  Allergen Reactions  . Visine [Tetrahydrozoline Hcl] Other (See Comments)    Eyes Swelling.     Consultations: Nephrology  Procedures/Studies: Dg Chest 2 View  Result Date: 06/18/2017 CLINICAL DATA:  Shortness of breath.  Worsening renal insufficiency. EXAM: CHEST  2 VIEW COMPARISON:  Radiograph 02/17/2017 FINDINGS: Cardiomegaly is similar to prior exam. Unchanged mediastinal contours. Small bilateral pleural effusions, improved from prior exam. Mild fluid in the fissures. Vascular congestion with development of mild pulmonary edema. No confluent airspace disease. No pneumothorax. No acute osseous abnormality. IMPRESSION: Worsening pulmonary edema. Persistent but improving pleural effusions. Cardiomegaly is unchanged. Electronically Signed   By: Jeb Levering M.D.   On: 06/18/2017 23:52   Dg Chest Port 1 View  Result Date: 06/24/2017 CLINICAL DATA:  Dialysis catheter placement EXAM: PORTABLE CHEST 1 VIEW COMPARISON:  06/20/2017 FINDINGS: 1508 hours. The cardio pericardial silhouette is enlarged. Right dialysis catheter is new in the interval with tip overlying the mid SVC. No pneumothorax. Lungs clear bilaterally. No pleural effusion. IMPRESSION: Right IJ dialysis catheter tip overlies the mid right atrium without evidence for pneumothorax. Electronically Signed   By: Misty Stanley M.D.   On: 06/24/2017 15:47   Dg Chest Port 1 View  Result Date: 06/20/2017 CLINICAL DATA:  Respiratory failure. EXAM: PORTABLE CHEST 1 VIEW COMPARISON:  06/18/2017. FINDINGS: The heart is enlarged. The lung fields are clear. There is no bony abnormality. Marked improvement in BILATERAL lung opacity, likely resolving pulmonary edema compared with radiographic 2 days earlier. IMPRESSION: Improved aeration, presumed clearing edema. Electronically Signed   By: Staci Righter M.D.   On: 06/20/2017 09:04   Dg Fluoro Guide Cv Line-no Report  Result Date: 06/24/2017 Fluoroscopy was utilized by the requesting physician.  No radiographic interpretation.    None  Subjective: Patient seen and examined the bedside  dialysis.  Remains comfortable.  No new complaints.  Blood pressure still on the higher side.  Discharge Exam: Vitals:   06/27/17 1000 06/27/17 1021  BP: (!) 162/96 (!) 171/108  Pulse: (!) 103 (!) 104  Resp: 17 19  Temp:  98.1 F (36.7 C)  SpO2:  98%   Vitals:   06/27/17 0900 06/27/17 0930 06/27/17 1000 06/27/17 1021  BP: (!) 154/99 (!) 170/98 (!) 162/96 (!) 171/108  Pulse: (!) 114 (!) 108 (!) 103 (!) 104  Resp: (!) 23 18 17 19   Temp:    98.1 F (36.7 C)  TempSrc:    Oral  SpO2:    98%  Weight:    71.5 kg (157 lb 10.1 oz)  Height:        General: Pt is alert, awake, not in acute distress Cardiovascular: RRR, S1/S2 +, no rubs, no gallops Respiratory: CTA bilaterally, no wheezing, no rhonchi Abdominal: Soft, NT, ND, bowel sounds + Extremities: no edema, no cyanosis    The results of significant diagnostics from this hospitalization (including imaging, microbiology, ancillary and laboratory) are listed below for reference.     Microbiology: Recent Results (from the past 240 hour(s))  MRSA PCR Screening     Status: None   Collection Time: 06/19/17 12:39 PM  Result Value  Ref Range Status   MRSA by PCR NEGATIVE NEGATIVE Final    Comment:        The GeneXpert MRSA Assay (FDA approved for NASAL specimens only), is one component of a comprehensive MRSA colonization surveillance program. It is not intended to diagnose MRSA infection nor to guide or monitor treatment for MRSA infections.   Surgical pcr screen     Status: None   Collection Time: 06/24/17  3:51 AM  Result Value Ref Range Status   MRSA, PCR NEGATIVE NEGATIVE Final   Staphylococcus aureus NEGATIVE NEGATIVE Final    Comment: (NOTE) The Xpert SA Assay (FDA approved for NASAL specimens in patients 39 years of age and older), is one component of a comprehensive surveillance program. It is not intended to diagnose infection nor to guide or monitor treatment.      Labs: BNP (last 3 results) Recent Labs     06/19/17 0046  BNP >4,580.9*   Basic Metabolic Panel: Recent Labs  Lab 06/24/17 0743 06/24/17 1904 06/25/17 0840 06/26/17 0427 06/27/17 0600  NA 133* 131* 131* 134* 133*  K 4.0 4.5 4.6 4.0 3.7  CL 99* 99* 98* 99* 97*  CO2 21* 16* 19* 21* 25  GLUCOSE 132* 142* 145* 121* 117*  BUN 90* 92* 95* 67* 44*  CREATININE 9.67* 9.64* 10.15* 8.14* 6.35*  CALCIUM 9.0 8.7* 9.1 8.8* 8.9  PHOS 7.4* 8.1* 8.7* 6.7* 5.5*   Liver Function Tests: Recent Labs  Lab 06/21/17 0321  06/24/17 0743 06/24/17 1904 06/25/17 0840 06/26/17 0427 06/27/17 0600  AST 39  --   --   --   --   --   --   ALT 116*  --   --   --   --   --   --   ALKPHOS 34*  --   --   --   --   --   --   BILITOT 0.6  --   --   --   --   --   --   PROT 5.1*  --   --   --   --   --   --   ALBUMIN 2.5*   < > 3.3* 3.3* 3.5 3.3* 3.1*   < > = values in this interval not displayed.   No results for input(s): LIPASE, AMYLASE in the last 168 hours. No results for input(s): AMMONIA in the last 168 hours. CBC: Recent Labs  Lab 06/21/17 0321 06/22/17 0319 06/23/17 0528 06/24/17 0743 06/24/17 1904  WBC 5.8 4.5 3.8* 4.5 7.7  NEUTROABS 4.2 3.1 2.5 3.0  --   HGB 8.3* 8.7* 8.4* 9.7* 9.6*  HCT 26.8* 27.2* 27.1* 31.4* 31.0*  MCV 84.3 84.2 84.2 83.5 84.7  PLT 176 163 182 245 209   Cardiac Enzymes: No results for input(s): CKTOTAL, CKMB, CKMBINDEX, TROPONINI in the last 168 hours. BNP: Invalid input(s): POCBNP CBG: No results for input(s): GLUCAP in the last 168 hours. D-Dimer No results for input(s): DDIMER in the last 72 hours. Hgb A1c No results for input(s): HGBA1C in the last 72 hours. Lipid Profile No results for input(s): CHOL, HDL, LDLCALC, TRIG, CHOLHDL, LDLDIRECT in the last 72 hours. Thyroid function studies No results for input(s): TSH, T4TOTAL, T3FREE, THYROIDAB in the last 72 hours.  Invalid input(s): FREET3 Anemia work up No results for input(s): VITAMINB12, FOLATE, FERRITIN, TIBC, IRON, RETICCTPCT in the  last 72 hours. Urinalysis    Component Value Date/Time   COLORURINE ORANGE (A) 02/13/2017  0827   APPEARANCEUR HAZY (A) 02/13/2017 0827   LABSPEC 1.020 02/13/2017 0827   PHURINE 6.0 02/13/2017 0827   GLUCOSEU NEGATIVE 02/13/2017 0827   HGBUR 3 (A) 02/13/2017 0827   BILIRUBINUR NEGATIVE 02/13/2017 0827   KETONESUR NEGATIVE 02/13/2017 0827   PROTEINUR >300 (A) 02/13/2017 0827   UROBILINOGEN 0.2 06/17/2016 1257   NITRITE NEGATIVE 02/13/2017 0827   LEUKOCYTESUR TRACE (A) 02/13/2017 0827   Sepsis Labs Invalid input(s): PROCALCITONIN,  WBC,  LACTICIDVEN Microbiology Recent Results (from the past 240 hour(s))  MRSA PCR Screening     Status: None   Collection Time: 06/19/17 12:39 PM  Result Value Ref Range Status   MRSA by PCR NEGATIVE NEGATIVE Final    Comment:        The GeneXpert MRSA Assay (FDA approved for NASAL specimens only), is one component of a comprehensive MRSA colonization surveillance program. It is not intended to diagnose MRSA infection nor to guide or monitor treatment for MRSA infections.   Surgical pcr screen     Status: None   Collection Time: 06/24/17  3:51 AM  Result Value Ref Range Status   MRSA, PCR NEGATIVE NEGATIVE Final   Staphylococcus aureus NEGATIVE NEGATIVE Final    Comment: (NOTE) The Xpert SA Assay (FDA approved for NASAL specimens in patients 12 years of age and older), is one component of a comprehensive surveillance program. It is not intended to diagnose infection nor to guide or monitor treatment.      Time coordinating discharge: Over 30 minutes  SIGNED:   Marene Lenz, MD  [facility] 06/27/2017, 1:02 PM Pager [phone]  If 7PM-7AM, please contact night-coverage [email or web address] Password [email or web address]

## 2017-06-27 NOTE — Progress Notes (Addendum)
[facility] ROUNDING NOTE   Subjective:      62M with hx/o Malignant HTN with TMA admitted with HTN emergency and  Appears to now be end stage renal disease  She had uremic odor and asterixis   Creatinine does not seem to be improving My guess is that this final episode of malignant hypertension has worsened her already tenuous renal function  Continues to do well on dialysis    CLIP to [place] in hemodialysis   Objective:  Vital signs in last 24 hours:  Temp:  [98.1 F (36.7 C)-99.2 F (37.3 C)] 98.1 F (36.7 C) (01/12 1021) Pulse Rate:  [86-114] 104 (01/12 1021) Resp:  [16-23] 19 (01/12 1021) BP: (108-181)/(92-112) 171/108 (01/12 1021) SpO2:  [95 %-99 %] 98 % (01/12 1021) Weight:  [157 lb 10.1 oz (71.5 kg)-166 lb 3.6 oz (75.4 kg)] 157 lb 10.1 oz (71.5 kg) (01/12 1021)  Weight change: -3 oz (-1.9 kg) Filed Weights   06/27/17 0500 06/27/17 0716 06/27/17 1021  Weight: 166 lb 3.6 oz (75.4 kg) 163 lb 5.8 oz (74.1 kg) 157 lb 10.1 oz (71.5 kg)    Intake/Output: I/O last 3 completed shifts: In: 710 [P.O.:360; I.V.:240; IV Piggyback:110] Out: 2000 [Other:2000]   Intake/Output this shift:  Total I/O In: -  Out: 2500 [Other:2500]  CVS- RRR no rub RS- CTA ABD- BS present soft non-distended EXT- no edema asterixes Basic Metabolic Panel: Recent Labs  Lab 06/24/17 0743 06/24/17 1904 06/25/17 0840 06/26/17 0427 06/27/17 0600  NA 133* 131* 131* 134* 133*  K 4.0 4.5 4.6 4.0 3.7  CL 99* 99* 98* 99* 97*  CO2 21* 16* 19* 21* 25  GLUCOSE 132* 142* 145* 121* 117*  BUN 90* 92* 95* 67* 44*  CREATININE 9.67* 9.64* 10.15* 8.14* 6.35*  CALCIUM 9.0 8.7* 9.1 8.8* 8.9  PHOS 7.4* 8.1* 8.7* 6.7* 5.5*    Liver Function Tests: Recent Labs  Lab 06/21/17 0321  06/24/17 0743 06/24/17 1904 06/25/17 0840 06/26/17 0427 06/27/17 0600  AST 39  --   --   --   --   --   --   ALT 116*  --   --   --   --   --   --   ALKPHOS 34*  --   --   --   --   --    --   BILITOT 0.6  --   --   --   --   --   --   PROT 5.1*  --   --   --   --   --   --   ALBUMIN 2.5*   < > 3.3* 3.3* 3.5 3.3* 3.1*   < > = values in this interval not displayed.   No results for input(s): LIPASE, AMYLASE in the last 168 hours. No results for input(s): AMMONIA in the last 168 hours.  CBC: Recent Labs  Lab 06/21/17 0321 06/22/17 0319 06/23/17 0528 06/24/17 0743 06/24/17 1904  WBC 5.8 4.5 3.8* 4.5 7.7  NEUTROABS 4.2 3.1 2.5 3.0  --   HGB 8.3* 8.7* 8.4* 9.7* 9.6*  HCT 26.8* 27.2* 27.1* 31.4* 31.0*  MCV 84.3 84.2 84.2 83.5 84.7  PLT 176 163 182 245 209    Cardiac Enzymes: No results for input(s): CKTOTAL, CKMB, CKMBINDEX, TROPONINI in the last 168 hours.  BNP: Invalid input(s): POCBNP  CBG: No results for input(s): GLUCAP in the last 168 hours.  Microbiology: Results for orders  placed or performed during the hospital encounter of 06/18/17  MRSA PCR Screening     Status: None   Collection Time: 06/19/17 12:39 PM  Result Value Ref Range Status   MRSA by PCR NEGATIVE NEGATIVE Final    Comment:        The GeneXpert MRSA Assay (FDA approved for NASAL specimens only), is one component of a comprehensive MRSA colonization surveillance program. It is not intended to diagnose MRSA infection nor to guide or monitor treatment for MRSA infections.   Surgical pcr screen     Status: None   Collection Time: 06/24/17  3:51 AM  Result Value Ref Range Status   MRSA, PCR NEGATIVE NEGATIVE Final   Staphylococcus aureus NEGATIVE NEGATIVE Final    Comment: (NOTE) The Xpert SA Assay (FDA approved for NASAL specimens in patients 41 years of age and older), is one component of a comprehensive surveillance program. It is not intended to diagnose infection nor to guide or monitor treatment.     Coagulation Studies: No results for input(s): LABPROT, INR in the last 72 hours.  Urinalysis: No results for input(s): COLORURINE, LABSPEC, PHURINE, GLUCOSEU, HGBUR,  BILIRUBINUR, KETONESUR, PROTEINUR, UROBILINOGEN, NITRITE, LEUKOCYTESUR in the last 72 hours.  Invalid input(s): APPERANCEUR    Imaging: No results found.   Medications:   . sodium chloride    . sodium chloride 10 mL/hr at 06/24/17 1204  . ferric gluconate (FERRLECIT/NULECIT) IV Stopped (06/26/17 1227)  . niCARDipine Stopped (06/20/17 0715)   . carvedilol  25 mg Oral BID WC  . cloNIDine  0.2 mg Oral Q8H  . darbepoetin (ARANESP) injection - DIALYSIS  60 mcg Intravenous Weekly  . docusate sodium  100 mg Oral BID  . hydrALAZINE  100 mg Oral Q8H  . NIFEdipine  90 mg Oral QHS  . pantoprazole  40 mg Oral Q1200   sodium chloride, acetaminophen, Influenza vac split quadrivalent PF, labetalol, oxyCODONE-acetaminophen, pneumococcal 23 valent vaccine, simethicone  Assessment/ Plan:  1. AoCKD3 ? 2/2 malignant HTN now with progression 1. 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  uremic;Fetor but very low GFR interested in PDwill pass onto home training as out patient  however started HD   hemodialysis clip process  2. HTN  Improved with ultrafiltration 3. Nonadherence to BP meds as outpt, partially from cost 4. Stop sodium bicarbonate 5. Stop lasix as starting dialysis       LOS: 8 Federica Allport W @TODAY @10 :59 AM

## 2017-06-30 DIAGNOSIS — I129 Hypertensive chronic kidney disease with stage 1 through stage 4 chronic kidney disease, or unspecified chronic kidney disease: Secondary | ICD-10-CM | POA: Insufficient documentation

## 2017-06-30 DIAGNOSIS — Z4901 Encounter for fitting and adjustment of extracorporeal dialysis catheter: Secondary | ICD-10-CM | POA: Insufficient documentation

## 2017-06-30 DIAGNOSIS — N189 Chronic kidney disease, unspecified: Secondary | ICD-10-CM | POA: Insufficient documentation

## 2017-06-30 DIAGNOSIS — N2581 Secondary hyperparathyroidism of renal origin: Secondary | ICD-10-CM | POA: Insufficient documentation

## 2017-06-30 DIAGNOSIS — D509 Iron deficiency anemia, unspecified: Secondary | ICD-10-CM | POA: Insufficient documentation

## 2017-06-30 DIAGNOSIS — D689 Coagulation defect, unspecified: Secondary | ICD-10-CM | POA: Insufficient documentation

## 2017-06-30 DIAGNOSIS — D631 Anemia in chronic kidney disease: Secondary | ICD-10-CM | POA: Insufficient documentation

## 2017-06-30 DIAGNOSIS — D638 Anemia in other chronic diseases classified elsewhere: Secondary | ICD-10-CM | POA: Insufficient documentation

## 2017-06-30 HISTORY — DX: Hypertensive chronic kidney disease with stage 1 through stage 4 chronic kidney disease, or unspecified chronic kidney disease: I12.9

## 2017-07-28 ENCOUNTER — Encounter (HOSPITAL_COMMUNITY): Payer: Medicaid Other

## 2017-07-28 ENCOUNTER — Encounter: Payer: Medicaid Other | Admitting: Vascular Surgery

## 2017-08-17 ENCOUNTER — Encounter (HOSPITAL_COMMUNITY): Payer: Medicaid Other

## 2017-08-18 ENCOUNTER — Ambulatory Visit (INDEPENDENT_AMBULATORY_CARE_PROVIDER_SITE_OTHER): Payer: Self-pay | Admitting: Vascular Surgery

## 2017-08-18 ENCOUNTER — Ambulatory Visit (HOSPITAL_COMMUNITY)
Admission: RE | Admit: 2017-08-18 | Discharge: 2017-08-18 | Disposition: A | Discharge status: Home / Self Care | Payer: Medicaid Other | Source: Ambulatory Visit | Attending: Vascular Surgery | Admitting: Vascular Surgery

## 2017-08-18 ENCOUNTER — Encounter: Payer: Self-pay | Admitting: Vascular Surgery

## 2017-08-18 ENCOUNTER — Other Ambulatory Visit: Payer: Self-pay

## 2017-08-18 VITALS — BP 135/88 | HR 74 | Temp 98.1°F | Resp 14 | Ht 60.0 in | Wt 159.0 lb

## 2017-08-18 DIAGNOSIS — N186 End stage renal disease: Secondary | ICD-10-CM | POA: Insufficient documentation

## 2017-08-18 DIAGNOSIS — Z992 Dependence on renal dialysis: Secondary | ICD-10-CM

## 2017-08-18 DIAGNOSIS — Z48812 Encounter for surgical aftercare following surgery on the circulatory system: Secondary | ICD-10-CM | POA: Diagnosis not present

## 2017-08-18 NOTE — Progress Notes (Signed)
   Patient name: Madeline Brown MRN: [id number] DOB: [birth year] Sex: female  REASON FOR VISIT: Follow-up left AV fistula creation 06/24/2017 and placement of hemodialysis catheter right IJ at the same setting  HPI: Madeline Brown is a 42 y.o. female here today for follow-up of right IJ hemodialysis catheter and left brachiocephalic AV fistula creation on 06/24/2017.  She reports that she is having successful dialysis via her catheter with no difficulties.  Current Outpatient Medications  Medication Sig Dispense Refill  . Aspirin-Acetaminophen-Caffeine (GOODY HEADACHE PO) Take 1 packet by mouth daily as needed. For cramping    . carvedilol (COREG) 25 MG tablet Take 1 tablet (25 mg total) by mouth 2 (two) times daily with a meal. 60 tablet 0  . cloNIDine (CATAPRES) 0.2 MG tablet Take 1 tablet (0.2 mg total) by mouth 3 (three) times daily. 90 tablet 0  . ferrous sulfate 325 (65 FE) MG tablet Take 1 tablet (325 mg total) by mouth 2 (two) times daily with a meal. 60 tablet 0  . hydrALAZINE (APRESOLINE) 100 MG tablet Take 1 tablet (100 mg total) by mouth every 8 (eight) hours. 90 tablet 0  . NIFEdipine (PROCARDIA XL/ADALAT-CC) 90 MG 24 hr tablet Take 1 tablet (90 mg total) by mouth at bedtime. 30 tablet 0  . traMADol (ULTRAM) 50 MG tablet Take 1 tablet (50 mg total) by mouth every 6 (six) hours as needed. 10 tablet 0   No current facility-administered medications for this visit.      PHYSICAL EXAM: There were no vitals filed for this visit.  GENERAL: The patient is a well-nourished female, in no acute distress. The vital signs are documented above. Her catheter insertion site is without erythema.  Dressing intact. Her left antecubital incision is completely healed.  She has very nice size maturation of her upper arm cephalic vein fistula.  It does run somewhat deep in the upper portion of her upper arm and also is somewhat tortuous.  Duplex of the access today  reveals very nice maturation in depth to proximally 7 mm.  The depth ranges from 5-7 mm.  MEDICAL ISSUES: Excellent Sherill Mangen maturation of fistula now 2 months out from creation.  I did discuss that this does run slightly deep in the upper arm and also has some tortuosity.  I feel that she has a good chance that this would be successful but most may require revision with superficial laceration as well.  Would recommend attempted access of the fistula in 1 month which would put her 15-month out from surgery.  If this is successful she could have her catheter removed.  If not then we would need to revise this to a more superficial location.  I explained this procedure in depth with the patient who understands.  We will not schedule a follow-up visit but would request notification if there is difficulty and we will schedule her for revision of her left upper arm AV fistula   Rosetta Posner, MD [facility] Tel [phone] Pager [phone]

## 2017-09-26 DIAGNOSIS — R519 Headache, unspecified: Secondary | ICD-10-CM | POA: Insufficient documentation

## 2017-09-26 HISTORY — DX: Headache, unspecified: R51.9

## 2017-10-21 ENCOUNTER — Ambulatory Visit (INDEPENDENT_AMBULATORY_CARE_PROVIDER_SITE_OTHER): Payer: Medicare Other | Admitting: Vascular Surgery

## 2017-10-21 ENCOUNTER — Encounter: Payer: Self-pay | Admitting: *Deleted

## 2017-10-21 ENCOUNTER — Other Ambulatory Visit: Payer: Self-pay | Admitting: *Deleted

## 2017-10-21 ENCOUNTER — Encounter: Payer: Self-pay | Admitting: Vascular Surgery

## 2017-10-21 ENCOUNTER — Other Ambulatory Visit: Payer: Self-pay

## 2017-10-21 VITALS — BP 204/129 | HR 95 | Temp 100.0°F | Resp 16 | Ht 60.0 in | Wt 145.8 lb

## 2017-10-21 DIAGNOSIS — N186 End stage renal disease: Secondary | ICD-10-CM | POA: Diagnosis not present

## 2017-10-21 NOTE — Progress Notes (Signed)
Established Dialysis Access   History of Present Illness   Madeline Brown is a 42 y.o. (November 21, 1975) female who presents for re-evaluation of L BC AVF.  This patient had a L BC AVF placed by Dr. Donnetta Hutching.  He seen her in follow up and felt the access was ready for use.  In the event of inability to cannulate, he recommended superficialization.  The patient was referred back to [place] for re-evaluation despite this documented plan.  The patient has been cannulated distally successfully, but reportedly has had no success proximally, getting only clot with cannulation.  Past Medical History:  Diagnosis Date  . Hypertension     Past Surgical History:  Procedure Laterality Date  . AV FISTULA PLACEMENT Left 06/24/2017   Procedure: CREATION OF LEFT ARM BRACHIALCEPHALIC  ARTERIOVENOUS (AV) FISTULA;  Surgeon: Rosetta Posner, MD;  Location: [place];  Service: Vascular;  Laterality: Left;  . CESAREAN SECTION     x2  . INSERTION OF DIALYSIS CATHETER Right 06/24/2017   Procedure: INSERTION OF TUNNELED DIALYSIS CATHETER;  Surgeon: Rosetta Posner, MD;  Location: [facility];  Service: Vascular;  Laterality: Right;    Social History   Socioeconomic History  . Marital status: Single    Spouse name: Not on file  . Number of children: Not on file  . Years of education: Not on file  . Highest education level: Not on file  Occupational History  . Not on file  Social Needs  . Financial resource strain: Not on file  . Food insecurity:    Worry: Not on file    Inability: Not on file  . Transportation needs:    Medical: Not on file    Non-medical: Not on file  Tobacco Use  . Smoking status: Current Every Day Smoker    Packs/day: 0.25    Years: 15.00    Pack years: 3.75    Types: Cigarettes  . Smokeless tobacco: Never Used  . Tobacco comment: 4 cigarettes per day  Substance and Sexual Activity  . Alcohol use: Yes    Comment: occassional beer  . Drug use: Yes    Types: Marijuana  . Sexual activity: Yes   Birth control/protection: None  Lifestyle  . Physical activity:    Days per week: Not on file    Minutes per session: Not on file  . Stress: Not on file  Relationships  . Social connections:    Talks on phone: Not on file    Gets together: Not on file    Attends religious service: Not on file    Active member of club or organization: Not on file    Attends meetings of clubs or organizations: Not on file    Relationship status: Not on file  . Intimate partner violence:    Fear of current or ex partner: Not on file    Emotionally abused: Not on file    Physically abused: Not on file    Forced sexual activity: Not on file  Other Topics Concern  . Not on file  Social History Narrative  . Not on file    Family History  Problem Relation Age of Onset  . Diabetes Father   . Hypertension Father   . Cancer Father     Current Outpatient Medications  Medication Sig Dispense Refill  . Aspirin-Acetaminophen-Caffeine (GOODY HEADACHE PO) Take 1 packet by mouth daily as needed. For cramping    . carvedilol (COREG) 25 MG tablet Take 1 tablet (  25 mg total) by mouth 2 (two) times daily with a meal. 60 tablet 0  . cloNIDine (CATAPRES) 0.2 MG tablet Take 1 tablet (0.2 mg total) by mouth 3 (three) times daily. 90 tablet 0  . hydrALAZINE (APRESOLINE) 100 MG tablet Take 1 tablet (100 mg total) by mouth every 8 (eight) hours. 90 tablet 0  . traMADol (ULTRAM) 50 MG tablet Take 1 tablet (50 mg total) by mouth every 6 (six) hours as needed. (Patient not taking: Reported on 10/21/2017) 10 tablet 0   No current facility-administered medications for this visit.      Allergies  Allergen Reactions  . Visine [Tetrahydrozoline Hcl] Other (See Comments)    Eyes Swelling.    REVIEW OF SYSTEMS (negative unless checked):   Cardiac:  []  Chest pain or chest pressure? []  Shortness of breath upon activity? []  Shortness of breath when lying flat? []  Irregular heart rhythm?  Vascular:  []  Pain in calf,  thigh, or hip brought on by walking? []  Pain in feet at night that wakes you up from your sleep? []  Blood clot in your veins? []  Leg swelling?  Pulmonary:  []  Oxygen at home? []  Productive cough? []  Wheezing?  Neurologic:  []  Sudden weakness in arms or legs? []  Sudden numbness in arms or legs? []  Sudden onset of difficult speaking or slurred speech? []  Temporary loss of vision in one eye? []  Problems with dizziness?  Gastrointestinal:  []  Blood in stool? []  Vomited blood?  Genitourinary:  []  Burning when urinating? []  Blood in urine? [x]   End stage renal disease-HD: T/R/S  Psychiatric:  []  Major depression  Hematologic:  []  Bleeding problems? []  Problems with blood clotting?  Dermatologic:  []  Rashes or ulcers?  Constitutional:  []  Fever or chills?  Ear/Nose/Throat:  []  Change in hearing? []  Nose bleeds? []  Sore throat?  Musculoskeletal:  []  Back pain? []  Joint pain? []  Muscle pain?   Physical Examination   Vitals:   10/21/17 1149 10/21/17 1151  BP: (!) 200/134 (!) 204/129  Pulse: 95   Resp: 16   Temp: 100 F (37.8 C)   TempSrc: Oral   SpO2: 100%   Weight: 145 lb 12.8 oz (66.1 kg)   Height: 5' (1.524 m)    Body mass index is 28.47 kg/m.  General Alert, O x 3, WD, NAD  Pulmonary Sym exp, good B air movt, CTA B  Cardiac RRR, Nl S1, S2, no Murmurs, No rubs, No S3,S4  Vascular Vessel Right Left  Radial Palpable Palpable  Brachial Palpable Palpable  Ulnar Not palpable Not palpable    Musculo- skeletal M/S 5/5 throughout, Extremities without ischemic changes, L BC AVF with palpable thrill, +bruit, +small PSA in distal 1/3, proximal fistula > 1 cm deep  Neurologic Pain and light touch intact in extremities , Motor exam as listed above    Medical Decision Making   Madeline Brown is a 42 y.o. female who presents with ESRD requiring hemodialysis, small PSA in distal 1/3 of L BC AVF, deep proximal fistula   I agree this patient needs  superficialization of the L BC AVF, in the process repair of the small PSA will likely need to be done.  Patient requests Dr. Donnetta Hutching complete the procedure.  Her procedure will be scheduled for 15 MAY 19. Risk, benefits, and alternatives to access surgery were discussed.   The patient is aware the risks include but are not limited to: bleeding, infection, steal syndrome, nerve damage, ischemic monomelic neuropathy, thrombosis, failure to  mature, complications related to venous hypertension, need for additional procedures, death and stroke.   The patient agrees to proceed forward with the procedure.    Adele Barthel, MD, FACS [facility] Office: [phone] Pager: [phone]

## 2017-10-21 NOTE — H&P (View-Only) (Signed)
Established Dialysis Access   History of Present Illness   Madeline Brown is a 42 y.o. ([birth year]) female who presents for re-evaluation of L BC AVF.  This patient had a L BC AVF placed by Dr. Donnetta Hutching.  He seen her in follow up and felt the access was ready for use.  In the event of inability to cannulate, he recommended superficialization.  The patient was referred back to [place] for re-evaluation despite this documented plan.  The patient has been cannulated distally successfully, but reportedly has had no success proximally, getting only clot with cannulation.  Past Medical History:  Diagnosis Date  . Hypertension     Past Surgical History:  Procedure Laterality Date  . AV FISTULA PLACEMENT Left 06/24/2017   Procedure: CREATION OF LEFT ARM BRACHIALCEPHALIC  ARTERIOVENOUS (AV) FISTULA;  Surgeon: Rosetta Posner, MD;  Location: [place];  Service: Vascular;  Laterality: Left;  . CESAREAN SECTION     x2  . INSERTION OF DIALYSIS CATHETER Right 06/24/2017   Procedure: INSERTION OF TUNNELED DIALYSIS CATHETER;  Surgeon: Rosetta Posner, MD;  Location: [facility];  Service: Vascular;  Laterality: Right;    Social History   Socioeconomic History  . Marital status: Single    Spouse name: Not on file  . Number of children: Not on file  . Years of education: Not on file  . Highest education level: Not on file  Occupational History  . Not on file  Social Needs  . Financial resource strain: Not on file  . Food insecurity:    Worry: Not on file    Inability: Not on file  . Transportation needs:    Medical: Not on file    Non-medical: Not on file  Tobacco Use  . Smoking status: Current Every Day Smoker    Packs/day: 0.25    Years: 15.00    Pack years: 3.75    Types: Cigarettes  . Smokeless tobacco: Never Used  . Tobacco comment: 4 cigarettes per day  Substance and Sexual Activity  . Alcohol use: Yes    Comment: occassional beer  . Drug use: Yes    Types: Marijuana  . Sexual activity: Yes   Birth control/protection: None  Lifestyle  . Physical activity:    Days per week: Not on file    Minutes per session: Not on file  . Stress: Not on file  Relationships  . Social connections:    Talks on phone: Not on file    Gets together: Not on file    Attends religious service: Not on file    Active member of club or organization: Not on file    Attends meetings of clubs or organizations: Not on file    Relationship status: Not on file  . Intimate partner violence:    Fear of current or ex partner: Not on file    Emotionally abused: Not on file    Physically abused: Not on file    Forced sexual activity: Not on file  Other Topics Concern  . Not on file  Social History Narrative  . Not on file    Family History  Problem Relation Age of Onset  . Diabetes Father   . Hypertension Father   . Cancer Father     Current Outpatient Medications  Medication Sig Dispense Refill  . Aspirin-Acetaminophen-Caffeine (GOODY HEADACHE PO) Take 1 packet by mouth daily as needed. For cramping    . carvedilol (COREG) 25 MG tablet Take 1 tablet (  25 mg total) by mouth 2 (two) times daily with a meal. 60 tablet 0  . cloNIDine (CATAPRES) 0.2 MG tablet Take 1 tablet (0.2 mg total) by mouth 3 (three) times daily. 90 tablet 0  . hydrALAZINE (APRESOLINE) 100 MG tablet Take 1 tablet (100 mg total) by mouth every 8 (eight) hours. 90 tablet 0  . traMADol (ULTRAM) 50 MG tablet Take 1 tablet (50 mg total) by mouth every 6 (six) hours as needed. (Patient not taking: Reported on 10/21/2017) 10 tablet 0   No current facility-administered medications for this visit.      Allergies  Allergen Reactions  . Visine [Tetrahydrozoline Hcl] Other (See Comments)    Eyes Swelling.    REVIEW OF SYSTEMS (negative unless checked):   Cardiac:  []  Chest pain or chest pressure? []  Shortness of breath upon activity? []  Shortness of breath when lying flat? []  Irregular heart rhythm?  Vascular:  []  Pain in calf,  thigh, or hip brought on by walking? []  Pain in feet at night that wakes you up from your sleep? []  Blood clot in your veins? []  Leg swelling?  Pulmonary:  []  Oxygen at home? []  Productive cough? []  Wheezing?  Neurologic:  []  Sudden weakness in arms or legs? []  Sudden numbness in arms or legs? []  Sudden onset of difficult speaking or slurred speech? []  Temporary loss of vision in one eye? []  Problems with dizziness?  Gastrointestinal:  []  Blood in stool? []  Vomited blood?  Genitourinary:  []  Burning when urinating? []  Blood in urine? [x]   End stage renal disease-HD: T/R/S  Psychiatric:  []  Major depression  Hematologic:  []  Bleeding problems? []  Problems with blood clotting?  Dermatologic:  []  Rashes or ulcers?  Constitutional:  []  Fever or chills?  Ear/Nose/Throat:  []  Change in hearing? []  Nose bleeds? []  Sore throat?  Musculoskeletal:  []  Back pain? []  Joint pain? []  Muscle pain?   Physical Examination   Vitals:   10/21/17 1149 10/21/17 1151  BP: (!) 200/134 (!) 204/129  Pulse: 95   Resp: 16   Temp: 100 F (37.8 C)   TempSrc: Oral   SpO2: 100%   Weight: 145 lb 12.8 oz (66.1 kg)   Height: 5' (1.524 m)    Body mass index is 28.47 kg/m.  General Alert, O x 3, WD, NAD  Pulmonary Sym exp, good B air movt, CTA B  Cardiac RRR, Nl S1, S2, no Murmurs, No rubs, No S3,S4  Vascular Vessel Right Left  Radial Palpable Palpable  Brachial Palpable Palpable  Ulnar Not palpable Not palpable    Musculo- skeletal M/S 5/5 throughout, Extremities without ischemic changes, L BC AVF with palpable thrill, +bruit, +small PSA in distal 1/3, proximal fistula > 1 cm deep  Neurologic Pain and light touch intact in extremities , Motor exam as listed above    Medical Decision Making   [name] is a 42 y.o. female who presents with ESRD requiring hemodialysis, small PSA in distal 1/3 of L BC AVF, deep proximal fistula   I agree this patient needs  superficialization of the L BC AVF, in the process repair of the small PSA will likely need to be done.  Patient requests Dr. Donnetta Hutching complete the procedure.  Her procedure will be scheduled for 15 MAY 19. Risk, benefits, and alternatives to access surgery were discussed.   The patient is aware the risks include but are not limited to: bleeding, infection, steal syndrome, nerve damage, ischemic monomelic neuropathy, thrombosis, failure to  mature, complications related to venous hypertension, need for additional procedures, death and stroke.   The patient agrees to proceed forward with the procedure.    Adele Barthel, MD, FACS [facility] Office: [phone] Pager: [phone]

## 2017-10-28 ENCOUNTER — Ambulatory Visit (HOSPITAL_COMMUNITY)
Admission: RE | Admit: 2017-10-28 | Discharge: 2017-10-28 | Disposition: A | Discharge status: Home / Self Care | Payer: Medicare Other | Source: Ambulatory Visit | Attending: Vascular Surgery | Admitting: Vascular Surgery

## 2017-10-28 ENCOUNTER — Other Ambulatory Visit: Payer: Self-pay

## 2017-10-28 ENCOUNTER — Ambulatory Visit (HOSPITAL_COMMUNITY): Payer: Medicare Other | Admitting: Certified Registered Nurse Anesthetist

## 2017-10-28 ENCOUNTER — Encounter (HOSPITAL_COMMUNITY): Payer: Self-pay | Admitting: Certified Registered Nurse Anesthetist

## 2017-10-28 ENCOUNTER — Encounter (HOSPITAL_COMMUNITY)
Admission: RE | Disposition: A | Discharge status: Home / Self Care | Payer: Self-pay | Source: Ambulatory Visit | Attending: Vascular Surgery

## 2017-10-28 DIAGNOSIS — Z79899 Other long term (current) drug therapy: Secondary | ICD-10-CM | POA: Insufficient documentation

## 2017-10-28 DIAGNOSIS — T82898A Other specified complication of vascular prosthetic devices, implants and grafts, initial encounter: Secondary | ICD-10-CM | POA: Insufficient documentation

## 2017-10-28 DIAGNOSIS — Z7982 Long term (current) use of aspirin: Secondary | ICD-10-CM | POA: Insufficient documentation

## 2017-10-28 DIAGNOSIS — N186 End stage renal disease: Secondary | ICD-10-CM | POA: Insufficient documentation

## 2017-10-28 DIAGNOSIS — Z8249 Family history of ischemic heart disease and other diseases of the circulatory system: Secondary | ICD-10-CM | POA: Insufficient documentation

## 2017-10-28 DIAGNOSIS — L299 Pruritus, unspecified: Secondary | ICD-10-CM | POA: Insufficient documentation

## 2017-10-28 DIAGNOSIS — Y832 Surgical operation with anastomosis, bypass or graft as the cause of abnormal reaction of the patient, or of later complication, without mention of misadventure at the time of the procedure: Secondary | ICD-10-CM | POA: Insufficient documentation

## 2017-10-28 DIAGNOSIS — Z992 Dependence on renal dialysis: Secondary | ICD-10-CM | POA: Diagnosis not present

## 2017-10-28 DIAGNOSIS — Z888 Allergy status to other drugs, medicaments and biological substances status: Secondary | ICD-10-CM | POA: Diagnosis not present

## 2017-10-28 DIAGNOSIS — F17210 Nicotine dependence, cigarettes, uncomplicated: Secondary | ICD-10-CM | POA: Insufficient documentation

## 2017-10-28 DIAGNOSIS — I120 Hypertensive chronic kidney disease with stage 5 chronic kidney disease or end stage renal disease: Secondary | ICD-10-CM | POA: Diagnosis not present

## 2017-10-28 HISTORY — DX: Chronic kidney disease, unspecified: N18.9

## 2017-10-28 HISTORY — PX: FISTULA SUPERFICIALIZATION: SHX6341

## 2017-10-28 HISTORY — PX: LIGATION OF COMPETING BRANCHES OF ARTERIOVENOUS FISTULA: SHX5949

## 2017-10-28 LAB — POCT I-STAT 4, (NA,K, GLUC, HGB,HCT)
Glucose, Bld: 126 mg/dL — ABNORMAL HIGH (ref 65–99)
HEMATOCRIT: 36 % (ref 36.0–46.0)
Hemoglobin: 12.2 g/dL (ref 12.0–15.0)
Potassium: 3.6 mmol/L (ref 3.5–5.1)
SODIUM: 136 mmol/L (ref 135–145)

## 2017-10-28 LAB — HCG, SERUM, QUALITATIVE: Preg, Serum: NEGATIVE

## 2017-10-28 SURGERY — FISTULA SUPERFICIALIZATION
Anesthesia: General | Site: Arm Upper | Laterality: Left

## 2017-10-28 MED ORDER — TRAMADOL HCL 50 MG PO TABS: 50.0000 mg | ORAL_TABLET | Freq: Four times a day (QID) | ORAL | 0 refills | Status: DC | PRN

## 2017-10-28 MED ORDER — MIDAZOLAM HCL 5 MG/5ML IJ SOLN
INTRAMUSCULAR | Status: DC | PRN
  Administered 2017-10-28: 2 mg via INTRAVENOUS

## 2017-10-28 MED ORDER — SODIUM CHLORIDE 0.9 % IV SOLN
INTRAVENOUS | Status: AC
  Filled 2017-10-28: qty 1.2

## 2017-10-28 MED ORDER — CEFAZOLIN SODIUM-DEXTROSE 2-4 GM/100ML-% IV SOLN
2.0000 g | INTRAVENOUS | Status: AC
  Administered 2017-10-28: 2 g via INTRAVENOUS
  Filled 2017-10-28: qty 100

## 2017-10-28 MED ORDER — PROPOFOL 10 MG/ML IV BOLUS
INTRAVENOUS | Status: AC
  Filled 2017-10-28: qty 20

## 2017-10-28 MED ORDER — LIDOCAINE-EPINEPHRINE 0.5 %-1:200000 IJ SOLN
INTRAMUSCULAR | Status: AC
  Filled 2017-10-28: qty 1

## 2017-10-28 MED ORDER — FENTANYL CITRATE (PF) 100 MCG/2ML IJ SOLN
INTRAMUSCULAR | Status: DC | PRN
  Administered 2017-10-28 (×2): 25 ug via INTRAVENOUS
  Administered 2017-10-28: 50 ug via INTRAVENOUS

## 2017-10-28 MED ORDER — 0.9 % SODIUM CHLORIDE (POUR BTL) OPTIME
TOPICAL | Status: DC | PRN
  Administered 2017-10-28: 1000 mL

## 2017-10-28 MED ORDER — HYDRALAZINE HCL 50 MG PO TABS
100.0000 mg | ORAL_TABLET | Freq: Once | ORAL | Status: AC
  Administered 2017-10-28: 100 mg via ORAL
  Filled 2017-10-28: qty 2

## 2017-10-28 MED ORDER — DEXAMETHASONE SODIUM PHOSPHATE 10 MG/ML IJ SOLN
INTRAMUSCULAR | Status: DC | PRN
  Administered 2017-10-28: 10 mg via INTRAVENOUS

## 2017-10-28 MED ORDER — FENTANYL CITRATE (PF) 250 MCG/5ML IJ SOLN
INTRAMUSCULAR | Status: AC
Start: 2017-10-28 — End: ?
  Filled 2017-10-28: qty 5

## 2017-10-28 MED ORDER — CHLORHEXIDINE GLUCONATE 4 % EX LIQD: 60.0000 mL | Freq: Once | CUTANEOUS | Status: DC

## 2017-10-28 MED ORDER — MIDAZOLAM HCL 2 MG/2ML IJ SOLN
INTRAMUSCULAR | Status: AC
  Filled 2017-10-28: qty 2

## 2017-10-28 MED ORDER — SODIUM CHLORIDE 0.9 % IV SOLN
INTRAVENOUS | Status: DC | PRN
  Administered 2017-10-28: 10:00:00

## 2017-10-28 MED ORDER — ONDANSETRON HCL 4 MG/2ML IJ SOLN
INTRAMUSCULAR | Status: AC
  Filled 2017-10-28: qty 2

## 2017-10-28 MED ORDER — ROCURONIUM BROMIDE 50 MG/5ML IV SOLN
INTRAVENOUS | Status: AC
  Filled 2017-10-28: qty 1

## 2017-10-28 MED ORDER — CLONIDINE HCL 0.2 MG PO TABS
0.2000 mg | ORAL_TABLET | Freq: Once | ORAL | Status: AC
  Administered 2017-10-28: 0.2 mg via ORAL
  Filled 2017-10-28: qty 1

## 2017-10-28 MED ORDER — PROPOFOL 10 MG/ML IV BOLUS
INTRAVENOUS | Status: DC | PRN
  Administered 2017-10-28: 150 mg via INTRAVENOUS

## 2017-10-28 MED ORDER — CARVEDILOL 12.5 MG PO TABS
25.0000 mg | ORAL_TABLET | Freq: Two times a day (BID) | ORAL | Status: DC
  Administered 2017-10-28: 25 mg via ORAL
  Filled 2017-10-28: qty 2

## 2017-10-28 MED ORDER — ONDANSETRON HCL 4 MG/2ML IJ SOLN
INTRAMUSCULAR | Status: DC | PRN
  Administered 2017-10-28: 4 mg via INTRAVENOUS

## 2017-10-28 MED ORDER — SUCCINYLCHOLINE CHLORIDE 200 MG/10ML IV SOSY
PREFILLED_SYRINGE | INTRAVENOUS | Status: AC
  Filled 2017-10-28: qty 10

## 2017-10-28 MED ORDER — LIDOCAINE HCL (CARDIAC) PF 100 MG/5ML IV SOSY
PREFILLED_SYRINGE | INTRAVENOUS | Status: DC | PRN
  Administered 2017-10-28: 20 mg via INTRAVENOUS

## 2017-10-28 MED ORDER — SODIUM CHLORIDE 0.9 % IV SOLN
INTRAVENOUS | Status: DC
  Administered 2017-10-28: 09:00:00 via INTRAVENOUS

## 2017-10-28 MED ORDER — PHENYLEPHRINE HCL 10 MG/ML IJ SOLN
INTRAMUSCULAR | Status: DC | PRN
  Administered 2017-10-28 (×4): 40 ug via INTRAVENOUS

## 2017-10-28 SURGICAL SUPPLY — 32 items
ARMBAND PINK RESTRICT EXTREMIT (MISCELLANEOUS) ×5 IMPLANT
BANDAGE ELASTIC 4 VELCRO ST LF (GAUZE/BANDAGES/DRESSINGS) ×5 IMPLANT
BNDG GAUZE ELAST 4 BULKY (GAUZE/BANDAGES/DRESSINGS) ×5 IMPLANT
CANISTER SUCT 3000ML PPV (MISCELLANEOUS) ×5 IMPLANT
CANNULA VESSEL 3MM 2 BLNT TIP (CANNULA) ×5 IMPLANT
CLIP LIGATING EXTRA MED SLVR (CLIP) ×5 IMPLANT
CLIP LIGATING EXTRA SM BLUE (MISCELLANEOUS) ×5 IMPLANT
COVER PROBE W GEL 5X96 (DRAPES) ×5 IMPLANT
DERMABOND ADVANCED (GAUZE/BANDAGES/DRESSINGS) ×2
DERMABOND ADVANCED .7 DNX12 (GAUZE/BANDAGES/DRESSINGS) ×3 IMPLANT
ELECT REM PT RETURN 9FT ADLT (ELECTROSURGICAL) ×5
ELECTRODE REM PT RTRN 9FT ADLT (ELECTROSURGICAL) ×3 IMPLANT
GLOVE BIO SURGEON STRL SZ 6.5 (GLOVE) ×8 IMPLANT
GLOVE BIO SURGEONS STRL SZ 6.5 (GLOVE) ×2
GLOVE BIOGEL PI IND STRL 7.0 (GLOVE) ×6 IMPLANT
GLOVE BIOGEL PI INDICATOR 7.0 (GLOVE) ×4
GLOVE ECLIPSE 6.5 STRL STRAW (GLOVE) ×5 IMPLANT
GLOVE SS BIOGEL STRL SZ 7.5 (GLOVE) ×3 IMPLANT
GLOVE SUPERSENSE BIOGEL SZ 7.5 (GLOVE) ×2
GOWN STRL REUS W/ TWL LRG LVL3 (GOWN DISPOSABLE) ×9 IMPLANT
GOWN STRL REUS W/TWL LRG LVL3 (GOWN DISPOSABLE) ×6
KIT BASIN OR (CUSTOM PROCEDURE TRAY) ×5 IMPLANT
KIT TURNOVER KIT B (KITS) ×5 IMPLANT
NS IRRIG 1000ML POUR BTL (IV SOLUTION) ×5 IMPLANT
PACK CV ACCESS (CUSTOM PROCEDURE TRAY) ×5 IMPLANT
PAD ARMBOARD 7.5X6 YLW CONV (MISCELLANEOUS) ×10 IMPLANT
SUT PROLENE 6 0 CC (SUTURE) ×10 IMPLANT
SUT VIC AB 3-0 SH 27 (SUTURE) ×2
SUT VIC AB 3-0 SH 27X BRD (SUTURE) ×3 IMPLANT
TOWEL GREEN STERILE (TOWEL DISPOSABLE) ×5 IMPLANT
UNDERPAD 30X30 (UNDERPADS AND DIAPERS) ×5 IMPLANT
WATER STERILE IRR 1000ML POUR (IV SOLUTION) ×5 IMPLANT

## 2017-10-28 NOTE — Transfer of Care (Signed)
Immediate Anesthesia Transfer of Care Note  Patient: Madeline Brown  Procedure(s) Performed: SUPERFICIALIZATION LEFT BRACHIOCEPHALIC ARTERIOVENOUS FISTULA (Left Arm Upper) LIGATION OF COMPETING BRANCHES OF ARTERIOVENOUS FISTULA x3 (Arm Upper)  Patient Location: PACU  Anesthesia Type:General  Level of Consciousness: awake, alert , oriented and patient cooperative  Airway & Oxygen Therapy: Patient Spontanous Breathing and Patient connected to nasal cannula oxygen  Post-op Assessment: Report given to RN, Post -op Vital signs reviewed and stable and Patient moving all extremities X 4  Post vital signs: Reviewed and stable  Last Vitals:  Vitals Value Taken Time  BP 160/101 10/28/2017 11:41 AM  Temp 36.7 C 10/28/2017 11:41 AM  Pulse 88 10/28/2017 11:41 AM  Resp 21 10/28/2017 11:42 AM  SpO2 88 % 10/28/2017 11:41 AM  Vitals shown include unvalidated device data.  Last Pain:  Vitals:   10/28/17 1141  TempSrc:   PainSc: 0-No pain      Patients Stated Pain Goal: 0 (89/38/10 1751)  Complications: No apparent anesthesia complications

## 2017-10-28 NOTE — Discharge Instructions (Signed)
° °  [facility]  Discharge Instructions  AV Fistula or Graft Surgery for Dialysis Access  Please refer to the following instructions for your post-procedure care. Your surgeon or physician assistant will discuss any changes with you.  Activity  You may drive the day following your surgery, if you are comfortable and no longer taking prescription pain medication. Resume full activity as the soreness in your incision resolves.  Bathing/Showering  You may shower after you go home. Keep your incision dry for 48 hours. Do not soak in a bathtub, hot tub, or swim until the incision heals completely. You may not shower if you have a hemodialysis catheter.  Incision Care  Clean your incision with mild soap and water after 48 hours. Pat the area dry with a clean towel. You do not need a bandage unless otherwise instructed. Do not apply any ointments or creams to your incision. You may have skin glue on your incision. Do not peel it off. It will come off on its own in about one week. Your arm may swell a bit after surgery. To reduce swelling use pillows to elevate your arm so it is above your heart. Your doctor will tell you if you need to lightly wrap your arm with an ACE bandage.  Diet  Resume your normal diet. There are not special food restrictions following this procedure. In order to heal from your surgery, it is CRITICAL to get adequate nutrition. Your body requires vitamins, minerals, and protein. Vegetables are the best source of vitamins and minerals. Vegetables also provide the perfect balance of protein. Processed food has little nutritional value, so try to avoid this.  Medications  Resume taking all of your medications. If your incision is causing pain, you may take over-the counter pain relievers such as acetaminophen (Tylenol). If you were prescribed a stronger pain medication, please be aware these medications can cause nausea and constipation. Prevent  nausea by taking the medication with a snack or meal. Avoid constipation by drinking plenty of fluids and eating foods with high amount of fiber, such as fruits, vegetables, and grains.  Do not take Tylenol if you are taking prescription pain medications.  Follow up Your surgeon may want to see you in the office following your access surgery. If so, this will be arranged at the time of your surgery.  Please call us immediately for any of the following conditions:  Increased pain, redness, drainage (pus) from your incision site Fever of 101 degrees or higher Severe or worsening pain at your incision site Hand pain or numbness.  Reduce your risk of vascular disease:  Stop smoking. If you would like help, call [facility] at 1-800-QUIT-NOW 567-433-3900) or [place] [facility] [place] your cholesterol Maintain a desired weight Control your diabetes Keep your blood pressure down  Dialysis  It will take several weeks to several months for your new dialysis access to be ready for use. Your surgeon will determine when it is okay to use it. Your nephrologist will continue to direct your dialysis. You can continue to use your Permcath until your new access is ready for use.   10/28/2017 Elmina Hendel [id number] 1975/09/16  Surgeon(s): Early, Arvilla Meres, MD  Procedure(s): SUPERFICIALIZATION LEFT BRACHIOCEPHALIC ARTERIOVENOUS FISTULA LIGATION OF COMPETING BRANCHES OF ARTERIOVENOUS FISTULA x3  x Do not stick fistula for 6 weeks    If you have any questions, please call the office at [phone].

## 2017-10-28 NOTE — Op Note (Signed)
    OPERATIVE REPORT  DATE OF SURGERY: 10/28/2017  PATIENT: Madeline Brown, 42 y.o. female MRN: [id number]  DOB: [birth year]  PRE-OPERATIVE DIAGNOSIS: End-stage renal disease with poorly functioning left upper arm AV fistula  POST-OPERATIVE DIAGNOSIS:  Same  PROCEDURE: Ligation of competing branches and superficial mobilization of left upper arm AV fistula  SURGEON:  Curt Jews, M.D.  PHYSICIAN ASSISTANT: Liana Crocker, PA-C  ANESTHESIA: General  EBL: Minimal ml  Total I/O In: 625 [I.V.:525; IV Piggyback:100] Out: -   BLOOD ADMINISTERED: None  DRAINS: None  SPECIMEN: None  COUNTS CORRECT:  YES  PLAN OF CARE: PACU  PATIENT DISPOSITION:  PACU - hemodynamically stable  PROCEDURE DETAILS: The patient was taken to the operating placed supine position where the area of the left arm was prepped and draped in usual sterile fashion.  SonoSite ultrasound was used to visualize the fistula from the antecubital space to the shoulder.  There was a large competing branches arising above the level of the antecubital space.  The vein did run deep to the fascia from mid upper arm towards the shoulder.  This was marked on the skin surface.  Incision was made over the competing branch and this band branch was ligated with 2-0 silk ties.  The vein was mobilized through this incision with removal of the fat and the fascia above this.  Next a separate incision was made over the upper arm and carried down to resect the fat above the level of the vein and also the fascia above the level of the cephalic vein.  Bleeding was controlled with electrocautery.  The wound was closed by simply closing the skin with 3-0 subcuticular stitch and a sterile dressing and Ace wrap was applied.  The patient had excellent thrill and a very nicely developed vein.   Madeline Brown, M.D., [facility] 10/28/2017 12:26 PM

## 2017-10-28 NOTE — Anesthesia Procedure Notes (Signed)
Procedure Name: LMA Insertion Date/Time: 10/28/2017 10:21 AM Performed by: Carney Living, CRNA Pre-anesthesia Checklist: Patient identified, Emergency Drugs available, Suction available, Patient being monitored and Timeout performed Patient Re-evaluated:Patient Re-evaluated prior to induction Oxygen Delivery Method: Circle system utilized Preoxygenation: Pre-oxygenation with 100% oxygen Induction Type: IV induction LMA: LMA inserted LMA Size: 4.0 Tube type: Oral Number of attempts: 1 Placement Confirmation: positive ETCO2 and breath sounds checked- equal and bilateral Tube secured with: Tape Dental Injury: Teeth and Oropharynx as per pre-operative assessment

## 2017-10-28 NOTE — Anesthesia Postprocedure Evaluation (Signed)
Anesthesia Post Note  Patient: Madeline Brown  Procedure(s) Performed: SUPERFICIALIZATION LEFT BRACHIOCEPHALIC ARTERIOVENOUS FISTULA (Left Arm Upper) LIGATION OF COMPETING BRANCHES OF ARTERIOVENOUS FISTULA x3 (Arm Upper)     Patient location during evaluation: PACU Anesthesia Type: General Level of consciousness: awake and alert, oriented and patient cooperative Pain management: pain level controlled Vital Signs Assessment: post-procedure vital signs reviewed and stable Respiratory status: spontaneous breathing, nonlabored ventilation and respiratory function stable Cardiovascular status: blood pressure returned to baseline and stable Postop Assessment: no apparent nausea or vomiting Anesthetic complications: no    Last Vitals:  Vitals:   10/28/17 1230 10/28/17 1250  BP: (!) 160/101 (!) 162/102  Pulse: 91 96  Resp: 15 16  Temp: 36.4 C (!) 36.3 C  SpO2: 99% 99%    Last Pain:  Vitals:   10/28/17 1230  TempSrc:   PainSc: 2                  Alyssah Algeo,E. [place]

## 2017-10-28 NOTE — Interval H&P Note (Signed)
History and Physical Interval Note:  10/28/2017 8:19 AM  Madeline Brown  has presented today for surgery, with the diagnosis of COMPLICATION WITH ARTERIOVENOUS FISTULA LEFT ARM  The various methods of treatment have been discussed with the patient and family. After consideration of risks, benefits and other options for treatment, the patient has consented to  Procedure(s): SUPERFICIALIZATION LEFT BRACHIOCEPHALIC ARTERIOVENOUS FISTULA (Left) REPAIR PSEUDOANEURYSM   LEFT BRACHIOCEPHALIC  ARTERIOVENOUS FISTULA (Left) as a surgical intervention .  The patient's history has been reviewed, patient examined, no change in status, stable for surgery.  I have reviewed the patient's chart and labs.  Questions were answered to the patient's satisfaction.     Curt Jews

## 2017-10-28 NOTE — Progress Notes (Signed)
Patient awaiting transport Lucianne Lei to arrive. RN notified that patient ready for pickup. Husband at bedside, discharge instructions reviewed. Awaiting Lucianne Lei to arrive.

## 2017-10-28 NOTE — Anesthesia Preprocedure Evaluation (Addendum)
Anesthesia Evaluation  Patient identified by MRN, date of birth, ID band Patient awake    Reviewed: Allergy & Precautions, NPO status , Patient's Chart, lab work & pertinent test results, reviewed documented beta blocker date and time   History of Anesthesia Complications Negative for: history of anesthetic complications  Airway Mallampati: II  TM Distance: >3 FB Neck ROM: Full    Dental  (+) Poor Dentition, Missing, Dental Advisory Given, Chipped   Pulmonary Current Smoker,    breath sounds clear to auscultation       Cardiovascular hypertension, Pt. on medications and Pt. on home beta blockers (-) angina Rhythm:Regular Rate:Normal  '18 ECHO: EF 50-55%, mild MR   Neuro/Psych negative neurological ROS     GI/Hepatic negative GI ROS, Neg liver ROS,   Endo/Other  negative endocrine ROS  Renal/GU ESRF and DialysisRenal disease (K+ 3.6)     Musculoskeletal negative musculoskeletal ROS (+)   Abdominal   Peds  Hematology negative hematology ROS (+)   Anesthesia Other Findings   Reproductive/Obstetrics                             Anesthesia Physical Anesthesia Plan  ASA: III  Anesthesia Plan: General   Post-op Pain Management:    Induction: Intravenous  PONV Risk Score and Plan: 2 and Ondansetron and Dexamethasone  Airway Management Planned: LMA  Additional Equipment:   Intra-op Plan:   Post-operative Plan:   Informed Consent: I have reviewed the patients History and Physical, chart, labs and discussed the procedure including the risks, benefits and alternatives for the proposed anesthesia with the patient or authorized representative who has indicated his/her understanding and acceptance.   Dental advisory given  Plan Discussed with: Surgeon and CRNA  Anesthesia Plan Comments: (Plan routine monitors, GA- LMA OK)        Anesthesia Quick Evaluation

## 2017-10-29 ENCOUNTER — Encounter (HOSPITAL_COMMUNITY): Payer: Self-pay | Admitting: Vascular Surgery

## 2017-10-29 ENCOUNTER — Telehealth: Payer: Self-pay | Admitting: Vascular Surgery

## 2017-10-29 NOTE — Telephone Encounter (Signed)
Sched appt 11/24/17 at 4:00. Spoke to pt to inform them of appt.

## 2017-10-29 NOTE — Telephone Encounter (Signed)
-----   Message from Mena Goes, RN sent at 10/28/2017 11:48 AM EDT ----- Regarding: 3-4 weeks postop   ----- Message ----- From: Gabriel Earing, PA-C Sent: 10/28/2017  11:21 AM To: [facility] Charge Pool  S/p superficialization LUA AVF 10/28/17.  F/u with Dr. Donnetta Hutching in 3-4 weeks.  Thanks

## 2017-11-24 ENCOUNTER — Encounter: Payer: Medicaid Other | Admitting: Vascular Surgery

## 2017-12-08 ENCOUNTER — Ambulatory Visit (INDEPENDENT_AMBULATORY_CARE_PROVIDER_SITE_OTHER): Payer: Self-pay | Admitting: Vascular Surgery

## 2017-12-08 ENCOUNTER — Encounter: Payer: Self-pay | Admitting: Vascular Surgery

## 2017-12-08 VITALS — BP 183/114 | HR 87 | Temp 99.0°F | Ht 60.0 in | Wt 145.0 lb

## 2017-12-08 DIAGNOSIS — N186 End stage renal disease: Secondary | ICD-10-CM

## 2017-12-08 NOTE — Progress Notes (Signed)
   Patient name: Madeline Brown MRN: [id number] DOB: June 07, 1976 Sex: female  REASON FOR VISIT: Aloe up left upper arm AV fistula  HPI: Madeline Brown is a 42 y.o. female today for follow-up.  She had undergone ligation of competing branches and superficial mobilization of her left upper arm AV fistula by myself on 10/28/2017.  She has had resolution of her discomfort around the time of surgery.  Current Outpatient Medications  Medication Sig Dispense Refill  . amLODipine (NORVASC) 10 MG tablet Take 10 mg by mouth at bedtime.    . Aspirin-Acetaminophen-Caffeine (GOODY HEADACHE PO) Take 1 packet by mouth daily as needed. For cramping    . carvedilol (COREG) 25 MG tablet Take 1 tablet (25 mg total) by mouth 2 (two) times daily with a meal. 60 tablet 0  . cloNIDine (CATAPRES) 0.2 MG tablet Take 1 tablet (0.2 mg total) by mouth 3 (three) times daily. 90 tablet 0  . hydrALAZINE (APRESOLINE) 100 MG tablet Take 1 tablet (100 mg total) by mouth every 8 (eight) hours. 90 tablet 0  . RENAGEL 800 MG tablet Take 1,600 mg by mouth 3 (three) times daily with meals.  12  . traMADol (ULTRAM) 50 MG tablet Take 1 tablet (50 mg total) by mouth every 6 (six) hours as needed. 10 tablet 0   No current facility-administered medications for this visit.      PHYSICAL EXAM: Vitals:   12/08/17 1549  BP: (!) 183/114  Pulse: 87  Temp: 99 F (37.2 C)  TempSrc: Oral  SpO2: 100%  Weight: 145 lb (65.8 kg)  Height: 5' (1.524 m)    GENERAL: The patient is a well-nourished female, in no acute distress. The vital signs are documented above. Her incisions are all healed quite nicely.  She does have excellent maturation of her upper arm brachiocephalic fistula.  The diameter is acceptable for hemodialysis access.  She does have some thickening at the super fistulization sites but should be able to stick through these now and this should become easier as the scar matures.  I feel that she  is safe to begin use of her fistula now and have removal of her catheter after several successful sessions.  MEDICAL ISSUES: Stable follow-up left upper arm brachiocephalic fistula ready for use   Rosetta Posner, MD FACS [facility] Tel [phone] Pager [phone]

## 2018-03-09 ENCOUNTER — Emergency Department (HOSPITAL_COMMUNITY)
Admission: EM | Admit: 2018-03-09 | Discharge: 2018-03-10 | Disposition: A | Discharge status: Home / Self Care | Payer: Medicare Other | Attending: Emergency Medicine | Admitting: Emergency Medicine

## 2018-03-09 ENCOUNTER — Encounter (HOSPITAL_COMMUNITY): Payer: Self-pay | Admitting: Emergency Medicine

## 2018-03-09 ENCOUNTER — Other Ambulatory Visit: Payer: Self-pay

## 2018-03-09 DIAGNOSIS — Z992 Dependence on renal dialysis: Secondary | ICD-10-CM | POA: Diagnosis not present

## 2018-03-09 DIAGNOSIS — F17210 Nicotine dependence, cigarettes, uncomplicated: Secondary | ICD-10-CM | POA: Diagnosis not present

## 2018-03-09 DIAGNOSIS — R1033 Periumbilical pain: Secondary | ICD-10-CM | POA: Diagnosis present

## 2018-03-09 DIAGNOSIS — I3139 Other pericardial effusion (noninflammatory): Secondary | ICD-10-CM

## 2018-03-09 DIAGNOSIS — I120 Hypertensive chronic kidney disease with stage 5 chronic kidney disease or end stage renal disease: Secondary | ICD-10-CM | POA: Diagnosis not present

## 2018-03-09 DIAGNOSIS — N186 End stage renal disease: Secondary | ICD-10-CM | POA: Diagnosis not present

## 2018-03-09 DIAGNOSIS — Z79899 Other long term (current) drug therapy: Secondary | ICD-10-CM | POA: Insufficient documentation

## 2018-03-09 DIAGNOSIS — I313 Pericardial effusion (noninflammatory): Secondary | ICD-10-CM | POA: Diagnosis not present

## 2018-03-09 LAB — CBC
HCT: 32.7 % — ABNORMAL LOW (ref 36.0–46.0)
HEMOGLOBIN: 10.7 g/dL — AB (ref 12.0–15.0)
MCH: 31.1 pg (ref 26.0–34.0)
MCHC: 32.7 g/dL (ref 30.0–36.0)
MCV: 95.1 fL (ref 78.0–100.0)
Platelets: 146 10*3/uL — ABNORMAL LOW (ref 150–400)
RBC: 3.44 MIL/uL — AB (ref 3.87–5.11)
RDW: 13.8 % (ref 11.5–15.5)
WBC: 3.2 10*3/uL — ABNORMAL LOW (ref 4.0–10.5)

## 2018-03-09 LAB — COMPREHENSIVE METABOLIC PANEL
ALT: 21 U/L (ref 0–44)
ANION GAP: 15 (ref 5–15)
AST: 22 U/L (ref 15–41)
Albumin: 4 g/dL (ref 3.5–5.0)
Alkaline Phosphatase: 52 U/L (ref 38–126)
BUN: 53 mg/dL — ABNORMAL HIGH (ref 6–20)
CHLORIDE: 96 mmol/L — AB (ref 98–111)
CO2: 24 mmol/L (ref 22–32)
Calcium: 9.4 mg/dL (ref 8.9–10.3)
Creatinine, Ser: 8.9 mg/dL — ABNORMAL HIGH (ref 0.44–1.00)
GFR calc non Af Amer: 5 mL/min — ABNORMAL LOW (ref >60)
GFR, EST AFRICAN AMERICAN: 6 mL/min — AB (ref >60)
Glucose, Bld: 100 mg/dL — ABNORMAL HIGH (ref 70–99)
Potassium: 3.2 mmol/L — ABNORMAL LOW (ref 3.5–5.1)
SODIUM: 135 mmol/L (ref 135–145)
Total Bilirubin: 1.2 mg/dL (ref 0.3–1.2)
Total Protein: 7.1 g/dL (ref 6.5–8.1)

## 2018-03-09 LAB — URINALYSIS, ROUTINE W REFLEX MICROSCOPIC
Bacteria, UA: NONE SEEN
Bilirubin Urine: NEGATIVE
Glucose, UA: NEGATIVE mg/dL
Hgb urine dipstick: NEGATIVE
KETONES UR: NEGATIVE mg/dL
Leukocytes, UA: NEGATIVE
Nitrite: NEGATIVE
PROTEIN: 100 mg/dL — AB
Specific Gravity, Urine: 1.014 (ref 1.005–1.030)
pH: 5 (ref 5.0–8.0)

## 2018-03-09 LAB — LIPASE, BLOOD: LIPASE: 87 U/L — AB (ref 11–51)

## 2018-03-09 LAB — I-STAT BETA HCG BLOOD, ED (MC, WL, AP ONLY): I-stat hCG, quantitative: <5 m[IU]/mL (ref <5)

## 2018-03-09 NOTE — ED Provider Notes (Signed)
Patient placed in Quick Look pathway, seen and evaluated   Chief Complaint: Constipation  HPI:   ESRD patient (HD Tu/THurs/Sat) presents via EMS for abdominal pain and constipation. Missed dialysis today due to pain. She reports abdominal pain is periumbilical, sharp and intermittent. No fever or vomiting. Has not had period in two months. Last bowel movement 9 days ago and difficult to pass. She does urinate, no urinary symptoms.   ROS: No fever  Physical Exam:   Gen: No distress  Neuro: Awake and Alert  Skin: Warm    Focused Exam: Abdomen soft and appears somewhat distended. No focal tenderness.    Initiation of care has begun. The patient has been counseled on the process, plan, and necessity for staying for the completion/evaluation, and the remainder of the medical screening examination    Madeline Brown 03/09/18 [place], [place], DO 03/09/18 2230

## 2018-03-09 NOTE — ED Triage Notes (Signed)
Pt c/o lower abdominal pain and constipation x 1 week. Pt unable to make it to dialysis today. No chest pain/shortness of breath.

## 2018-03-10 ENCOUNTER — Emergency Department (HOSPITAL_COMMUNITY): Payer: Medicare Other

## 2018-03-10 DIAGNOSIS — R1033 Periumbilical pain: Secondary | ICD-10-CM | POA: Diagnosis not present

## 2018-03-10 MED ORDER — POLYETHYLENE GLYCOL 3350 17 G PO PACK: 17.0000 g | PACK | Freq: Every day | ORAL | 0 refills | Status: DC

## 2018-03-10 MED ORDER — OMEPRAZOLE 20 MG PO CPDR
20.0000 mg | DELAYED_RELEASE_CAPSULE | Freq: Every day | ORAL | 0 refills | Status: DC
Start: 2018-03-10 — End: 2018-07-09

## 2018-03-10 NOTE — Discharge Instructions (Addendum)
Your testing today is reassuring.  Take the constipation medication as prescribed.  Avoid alcohol, NSAIDs, caffeine, spicy foods .follow-up for dialysis tomorrow as scheduled.  You should also see the cardiologist regarding the fluid around your heart.  Return to the ED if you develop chest pain, shortness of breath or any other concerns.

## 2018-03-10 NOTE — ED Notes (Signed)
Pt discharged from ED; instructions provided and scripts given; Pt encouraged to return to ED if symptoms worsen and to f/u with PCP; Pt verbalized understanding of all instructions 

## 2018-03-10 NOTE — ED Provider Notes (Signed)
[facility] EMERGENCY DEPARTMENT Provider Note   CSN: [id number] Arrival date & time: 03/09/18  [place]     History   Chief Complaint Chief Complaint  Patient presents with  . Abdominal Pain    HPI Madeline Brown is a 42 y.o. female.  Patient with history of ESRD on dialysis Tuesday Thursday Saturday, hypertension presenting with abdominal pain that has been intermittent for the past 2 weeks.  She reports pain mostly in the center of her stomach that comes and goes lasting for several hours to minutes at a time.  It is sharp and intermittent.  The pain lasted longer than usual today so she called EMS.  She also missed dialysis today due to being in pain.  No fever or vomiting.  She still has a good appetite and wants to eat.  Last bowel movement was 9 days ago and had difficulty with passing stool.  She reports history of constipation in the past but normally goes every other day.  She is still passing gas.  No pain with urination or blood in the urine.  Still makes urine.  No abnormal vaginal bleeding.  Previous abdominal surgeries include C-section x2.  She is never had this kind of pain before.  No history of bowel obstruction.  No fevers or chills.  The history is provided by the patient.  Abdominal Pain   Associated symptoms include constipation. Pertinent negatives include diarrhea, nausea, vomiting, dysuria, hematuria, headaches, arthralgias and myalgias.    Past Medical History:  Diagnosis Date  . Chronic kidney disease   . Hypertension     Patient Active Problem List   Diagnosis Date Noted  . ESRD (end stage renal disease) ([place]) 06/24/2017  . Hypertensive emergency 06/19/2017  . Hypertensive crisis   . Acute renal failure superimposed on chronic kidney disease ([place])   . Epistaxis   . Hypertension 03/23/2017  . 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 Abdominal pain 02/14/2017  . Thrombocytopenia ([place])   . ARF (acute renal failure)  ([place]) 02/12/2017  . Anemia 02/12/2017  . Hypokalemia 02/12/2017  . Elevated troponin 02/12/2017  . Hyponatremia 02/12/2017  . Tachycardia 02/12/2017  . Epigastric pain     Past Surgical History:  Procedure Laterality Date  . AV FISTULA PLACEMENT Left 06/24/2017   Procedure: CREATION OF LEFT ARM BRACHIALCEPHALIC  ARTERIOVENOUS (AV) FISTULA;  Surgeon: Rosetta Posner, MD;  Location: [facility];  Service: Vascular;  Laterality: Left;  . CESAREAN SECTION     x2  . FISTULA SUPERFICIALIZATION Left 10/28/2017   Procedure: SUPERFICIALIZATION LEFT BRACHIOCEPHALIC ARTERIOVENOUS FISTULA;  Surgeon: Rosetta Posner, MD;  Location: [facility];  Service: Vascular;  Laterality: Left;  . INSERTION OF DIALYSIS CATHETER Right 06/24/2017   Procedure: INSERTION OF TUNNELED DIALYSIS CATHETER;  Surgeon: Rosetta Posner, MD;  Location: [facility];  Service: Vascular;  Laterality: Right;  . LIGATION OF COMPETING BRANCHES OF ARTERIOVENOUS FISTULA  10/28/2017   Procedure: LIGATION OF COMPETING BRANCHES OF ARTERIOVENOUS FISTULA x3;  Surgeon: Rosetta Posner, MD;  Location: [facility];  Service: Vascular;;     OB History    Gravida  8   Para  5   Term  5   Preterm      AB  2   Living  5     SAB      TAB  2   Ectopic      Multiple      Live Births  5  Home Medications    Prior to Admission medications   Medication Sig Start Date End Date Taking? Authorizing Provider  amLODipine (NORVASC) 10 MG tablet Take 10 mg by mouth at bedtime.    [provider]  Aspirin-Acetaminophen-Caffeine (GOODY HEADACHE PO) Take 1 packet by mouth daily as needed. For cramping    [provider]  carvedilol (COREG) 25 MG tablet Take 1 tablet (25 mg total) by mouth 2 (two) times daily with a meal. 06/27/17 10/23/19  Shelly Coss, MD  cloNIDine (CATAPRES) 0.2 MG tablet Take 1 tablet (0.2 mg total) by mouth 3 (three) times daily. 06/27/17 10/23/18  Shelly Coss, MD  hydrALAZINE (APRESOLINE) 100 MG tablet Take 1 tablet  (100 mg total) by mouth every 8 (eight) hours. 06/27/17 10/23/18  Shelly Coss, MD  RENAGEL 800 MG tablet Take 1,600 mg by mouth 3 (three) times daily with meals. 08/26/17   [provider]  traMADol (ULTRAM) 50 MG tablet Take 1 tablet (50 mg total) by mouth every 6 (six) hours as needed. 10/28/17   Gabriel Earing, PA-C    Family History Family History  Problem Relation Age of Onset  . Diabetes Father   . Hypertension Father   . Cancer Father     Social History Social History   Tobacco Use  . Smoking status: Current Every Day Smoker    Packs/day: 0.25    Years: 15.00    Pack years: 3.75    Types: Cigarettes  . Smokeless tobacco: Never Used  . Tobacco comment: 4 cigarettes per day  Substance Use Topics  . Alcohol use: Yes    Comment: occassional beer  . Drug use: Yes    Types: Marijuana     Allergies   Visine [tetrahydrozoline hcl]   Review of Systems Review of Systems  Constitutional: Positive for activity change and appetite change.  HENT: Negative for congestion and rhinorrhea.   Eyes: Negative for visual disturbance.  Respiratory: Negative for cough, chest tightness and shortness of breath.   Cardiovascular: Negative for chest pain.  Gastrointestinal: Positive for abdominal pain and constipation. Negative for diarrhea, nausea and vomiting.  Genitourinary: Negative for dysuria, hematuria, vaginal bleeding and vaginal discharge.  Musculoskeletal: Negative for arthralgias and myalgias.  Neurological: Negative for dizziness and headaches.   all other systems are negative except as noted in the HPI and PMH.     Physical Exam Updated Vital Signs BP (!) 153/96   Pulse 88   Temp 97.8 F (36.6 C) (Oral)   Resp 16   LMP 01/06/2018   SpO2 95%   Physical Exam  Constitutional: She is oriented to person, place, and time. She appears well-developed and well-nourished. No distress.  HENT:  Head: Normocephalic and atraumatic.  Mouth/Throat: Oropharynx is  clear and moist. No oropharyngeal exudate.  Eyes: Pupils are equal, round, and reactive to light. Conjunctivae and EOM are normal.  Neck: Normal range of motion. Neck supple.  No meningismus.  Cardiovascular: Normal rate, regular rhythm, normal heart sounds and intact distal pulses.  No murmur heard. Pulmonary/Chest: Effort normal and breath sounds normal. No respiratory distress.  Abdominal: Soft. There is tenderness. There is no rebound and no guarding.  Periumbilical tenderness with voluntary guarding, no masses  Musculoskeletal: Normal range of motion. She exhibits no edema or tenderness.  No CVA tenderness  Neurological: She is alert and oriented to person, place, and time. No cranial nerve deficit. She exhibits normal muscle tone. Coordination normal.   5/5 strength throughout. CN 2-12  intact.Equal grip strength.   Skin: Skin is warm.  Psychiatric: She has a normal mood and affect. Her behavior is normal.  Nursing note and vitals reviewed.    ED Treatments / Results  Labs (all labs ordered are listed, but only abnormal results are displayed) Labs Reviewed  LIPASE, BLOOD - Abnormal; Notable for the following components:      Result Value   Lipase 87 (*)    All other components within normal limits  COMPREHENSIVE METABOLIC PANEL - Abnormal; Notable for the following components:   Potassium 3.2 (*)    Chloride 96 (*)    Glucose, Bld 100 (*)    BUN 53 (*)    Creatinine, Ser 8.90 (*)    GFR calc non Af Amer 5 (*)    GFR calc Af Amer 6 (*)    All other components within normal limits  CBC - Abnormal; Notable for the following components:   WBC 3.2 (*)    RBC 3.44 (*)    Hemoglobin 10.7 (*)    HCT 32.7 (*)    Platelets 146 (*)    All other components within normal limits  URINALYSIS, ROUTINE W REFLEX MICROSCOPIC - Abnormal; Notable for the following components:   Protein, ur 100 (*)    All other components within normal limits  I-STAT BETA HCG BLOOD, ED ([facility], [facility], [facility] ONLY)      EKG EKG Interpretation  Date/Time:  Wednesday March 10 2018 05:16:08 EDT Ventricular Rate:  82 PR Interval:    QRS Duration: 82 QT Interval:  390 QTC Calculation: 456 R Axis:   83 Text Interpretation:  Sinus rhythm Prolonged PR interval Left atrial enlargement No significant change was found Confirmed by Ezequiel Essex [phone]) on 03/10/2018 5:32:56 AM   Radiology Ct Abdomen Pelvis Wo Contrast  Result Date: 03/10/2018 CLINICAL DATA:  42 year old female with acute abdominal pain. Dialysis patient. EXAM: CT ABDOMEN AND PELVIS WITHOUT CONTRAST TECHNIQUE: Multidetector CT imaging of the abdomen and pelvis was performed following the standard protocol without IV contrast. COMPARISON:  CT of the abdomen pelvis dated 02/12/2017 FINDINGS: Evaluation of this exam is limited in the absence of intravenous contrast. Lower chest: Bibasilar linear atelectasis/scarring as well as subsegmental subpleural consolidative change at the right lung base which may represent atelectasis versus infiltrate. Probable trace bilateral pleural effusions. There is mild cardiomegaly. Partially visualized pericardial effusion measures approximately 2 cm in greatest thickness over the right atrium and increased since the prior CT. No intra-abdominal free air.  Trace free fluid within the pelvis. Hepatobiliary: No focal liver abnormality is seen. No gallstones, gallbladder wall thickening, or biliary dilatation. Pancreas: Unremarkable. No pancreatic ductal dilatation or surrounding inflammatory changes. Spleen: Normal in size without focal abnormality. Adrenals/Urinary Tract: Mild bilateral adrenal thickening. There is a 5 mm nonobstructing right renal inferior pole calculus as well as a punctate nonobstructing left renal upper pole calculus. No hydronephrosis. The visualized ureters and urinary bladder appear unremarkable. Stomach/Bowel: There is no bowel obstruction or active inflammation. Normal appendix.  Vascular/Lymphatic: Mild aortoiliac atherosclerotic disease. Evaluation of the vasculature is limited in the absence of intravenous contrast. No portal venous gas. There is no adenopathy. Reproductive: The uterus is anteverted and appears enlarged and myomatous. The ovaries are grossly unremarkable as visualized. Other: Diffuse subcutaneous edema. Musculoskeletal: No acute or significant osseous findings. Partially visualized lucent lesion with sclerotic periphery in the proximal right femoral diaphysis. This is incompletely visualized and not well characterized. However, no cortical breakage, periosteal reaction, or associated  soft tissue. Direct comparison with prior images, if available, recommended. If no prior images available, further evaluation with dedicated radiograph on a nonemergent basis recommended. IMPRESSION: 1. Cardiomegaly with moderate pericardial effusion, increased in size since the prior CT. 2. Bibasilar atelectasis versus infiltrate. 3. Diffuse subcutaneous edema and anasarca. 4. Nonobstructing bilateral renal calculi.  No hydronephrosis. 5. No bowel obstruction or active inflammation.  Normal appendix. 6. Enlarged myomatous uterus. Electronically Signed   By: Anner Crete M.D.   On: 03/10/2018 04:17    Procedures Procedures (including critical care time)  Medications Ordered in ED Medications - No data to display   Initial Impression / Assessment and Plan / ED Course  I have reviewed the triage vital signs and the nursing notes.  Pertinent labs & imaging results that were available during my care of the patient were reviewed by me and considered in my medical decision making (see chart for details).    Dialysis patient with 2 weeks of intermittent abdominal pain with constipation.  No vomiting or fever.  Abdomen without peritoneal signs.  Labs reassuring with normal potassium.  Lipase mildly elevated at 87.  Urinalysis negative.  CT abdomen reassuring. No SBO. Does show  cardiomegaly with pericardial effusion. No evidence of tamponade on bedside US.  Suspect anasarca and pericardial effusion due to ESRD state. Follow up for dialysis as scheduled as well as cardiology for pericardial effusion. Will treat constipation with miralax.  Return precautions discussed.    EMERGENCY DEPARTMENT [place] CARDIAC EXAM "Study: Limited Ultrasound of the Heart and Pericardium"  INDICATIONS:pericardial effusion Multiple views of the heart and pericardium were obtained in real-time with a multi-frequency probe.  PERFORMED FM:[id number] IMAGES ARCHIVED?: Yes LIMITATIONS:  Emergent procedure VIEWS USED: Subcostal 4 chamber, Parasternal long axis and Apical 4 chamber  INTERPRETATION: Cardiac activity present, Pericardial effusion present, Cardiac tamponade absent, Probable elevated CVP and Normal contractility    Final Clinical Impressions(s) / ED Diagnoses   Final diagnoses:  Periumbilical abdominal pain  ESRD (end stage renal disease) ([place])  Pericardial effusion    ED Discharge Orders    None       Ezequiel Essex, MD 03/10/18 [phone]

## 2018-03-10 NOTE — ED Notes (Signed)
ED Provider at bedside. 

## 2018-04-26 ENCOUNTER — Ambulatory Visit: Payer: Medicare Other | Admitting: Cardiology

## 2018-07-08 ENCOUNTER — Other Ambulatory Visit: Payer: Self-pay

## 2018-07-08 ENCOUNTER — Observation Stay (HOSPITAL_BASED_OUTPATIENT_CLINIC_OR_DEPARTMENT_OTHER)
Admission: EM | Admit: 2018-07-08 | Discharge: 2018-07-09 | Disposition: A | Discharge status: Still In Hospital | Payer: Medicare Other | Source: Home / Self Care | Attending: Emergency Medicine | Admitting: Emergency Medicine

## 2018-07-08 ENCOUNTER — Encounter (HOSPITAL_COMMUNITY): Payer: Self-pay | Admitting: Emergency Medicine

## 2018-07-08 DIAGNOSIS — D696 Thrombocytopenia, unspecified: Secondary | ICD-10-CM

## 2018-07-08 DIAGNOSIS — J90 Pleural effusion, not elsewhere classified: Secondary | ICD-10-CM

## 2018-07-08 DIAGNOSIS — Z992 Dependence on renal dialysis: Secondary | ICD-10-CM | POA: Insufficient documentation

## 2018-07-08 DIAGNOSIS — N186 End stage renal disease: Secondary | ICD-10-CM | POA: Insufficient documentation

## 2018-07-08 DIAGNOSIS — J9601 Acute respiratory failure with hypoxia: Secondary | ICD-10-CM

## 2018-07-08 DIAGNOSIS — I10 Essential (primary) hypertension: Secondary | ICD-10-CM | POA: Diagnosis present

## 2018-07-08 DIAGNOSIS — R0902 Hypoxemia: Secondary | ICD-10-CM | POA: Diagnosis not present

## 2018-07-08 DIAGNOSIS — Z79899 Other long term (current) drug therapy: Secondary | ICD-10-CM

## 2018-07-08 DIAGNOSIS — D638 Anemia in other chronic diseases classified elsewhere: Secondary | ICD-10-CM | POA: Insufficient documentation

## 2018-07-08 DIAGNOSIS — I120 Hypertensive chronic kidney disease with stage 5 chronic kidney disease or end stage renal disease: Secondary | ICD-10-CM

## 2018-07-08 DIAGNOSIS — J810 Acute pulmonary edema: Secondary | ICD-10-CM | POA: Insufficient documentation

## 2018-07-08 DIAGNOSIS — F17210 Nicotine dependence, cigarettes, uncomplicated: Secondary | ICD-10-CM | POA: Insufficient documentation

## 2018-07-08 DIAGNOSIS — Z8249 Family history of ischemic heart disease and other diseases of the circulatory system: Secondary | ICD-10-CM

## 2018-07-08 DIAGNOSIS — I132 Hypertensive heart and chronic kidney disease with heart failure and with stage 5 chronic kidney disease, or end stage renal disease: Secondary | ICD-10-CM | POA: Diagnosis not present

## 2018-07-08 NOTE — ED Triage Notes (Signed)
Per [facility],  Pt is dialysis patient, last tx today. Pt reports SHOB starting about 3 hours ago. Pt's O2 sats 76% on RA. Pt O2 sats 83-88% on 4L [place]. Pt placed on NRB, sats increased to 95%. Pt denies being sick recently, has not missed any dialysis tx's. Pt has non-productive cough. EMS reports crackles to bases of lungs. Pt alert and oriented at this time.

## 2018-07-09 ENCOUNTER — Encounter (HOSPITAL_COMMUNITY): Payer: Self-pay | Admitting: Internal Medicine

## 2018-07-09 ENCOUNTER — Emergency Department (HOSPITAL_COMMUNITY): Payer: Medicare Other

## 2018-07-09 DIAGNOSIS — J810 Acute pulmonary edema: Secondary | ICD-10-CM | POA: Diagnosis present

## 2018-07-09 DIAGNOSIS — I10 Essential (primary) hypertension: Secondary | ICD-10-CM | POA: Diagnosis not present

## 2018-07-09 DIAGNOSIS — N186 End stage renal disease: Secondary | ICD-10-CM

## 2018-07-09 LAB — CREATININE, SERUM
CREATININE: 3.87 mg/dL — AB (ref 0.44–1.00)
GFR calc Af Amer: 16 mL/min — ABNORMAL LOW (ref >60)
GFR calc non Af Amer: 14 mL/min — ABNORMAL LOW (ref >60)

## 2018-07-09 LAB — CBC WITH DIFFERENTIAL/PLATELET
Abs Immature Granulocytes: 0.01 10*3/uL (ref 0.00–0.07)
BASOS ABS: 0 10*3/uL (ref 0.0–0.1)
Basophils Relative: 1 %
EOS ABS: 0.1 10*3/uL (ref 0.0–0.5)
EOS PCT: 2 %
HEMATOCRIT: 34 % — AB (ref 36.0–46.0)
HEMOGLOBIN: 10.6 g/dL — AB (ref 12.0–15.0)
Immature Granulocytes: 0 %
LYMPHS ABS: 0.6 10*3/uL — AB (ref 0.7–4.0)
LYMPHS PCT: 16 %
MCH: 30.5 pg (ref 26.0–34.0)
MCHC: 31.2 g/dL (ref 30.0–36.0)
MCV: 98 fL (ref 80.0–100.0)
MONO ABS: 0.2 10*3/uL (ref 0.1–1.0)
MONOS PCT: 6 %
NRBC: 0 % (ref 0.0–0.2)
Neutro Abs: 3.1 10*3/uL (ref 1.7–7.7)
Neutrophils Relative %: 75 %
Platelets: 79 10*3/uL — ABNORMAL LOW (ref 150–400)
RBC: 3.47 MIL/uL — ABNORMAL LOW (ref 3.87–5.11)
RDW: 15.3 % (ref 11.5–15.5)
WBC: 4.1 10*3/uL (ref 4.0–10.5)

## 2018-07-09 LAB — CBC
HCT: 34.4 % — ABNORMAL LOW (ref 36.0–46.0)
Hemoglobin: 10.8 g/dL — ABNORMAL LOW (ref 12.0–15.0)
MCH: 31 pg (ref 26.0–34.0)
MCHC: 31.4 g/dL (ref 30.0–36.0)
MCV: 98.9 fL (ref 80.0–100.0)
Platelets: 79 10*3/uL — ABNORMAL LOW (ref 150–400)
RBC: 3.48 MIL/uL — ABNORMAL LOW (ref 3.87–5.11)
RDW: 15.3 % (ref 11.5–15.5)
WBC: 3.4 10*3/uL — ABNORMAL LOW (ref 4.0–10.5)
nRBC: 0 % (ref 0.0–0.2)

## 2018-07-09 LAB — TROPONIN I: Troponin I: 0.03 ng/mL (ref <0.03)

## 2018-07-09 LAB — BASIC METABOLIC PANEL
Anion gap: 9 (ref 5–15)
BUN: 16 mg/dL (ref 6–20)
CALCIUM: 8.7 mg/dL — AB (ref 8.9–10.3)
CO2: 32 mmol/L (ref 22–32)
CREATININE: 3.38 mg/dL — AB (ref 0.44–1.00)
Chloride: 98 mmol/L (ref 98–111)
GFR calc non Af Amer: 16 mL/min — ABNORMAL LOW (ref >60)
GFR, EST AFRICAN AMERICAN: 19 mL/min — AB (ref >60)
Glucose, Bld: 115 mg/dL — ABNORMAL HIGH (ref 70–99)
Potassium: 3.3 mmol/L — ABNORMAL LOW (ref 3.5–5.1)
SODIUM: 139 mmol/L (ref 135–145)

## 2018-07-09 LAB — HCG, SERUM, QUALITATIVE: Preg, Serum: NEGATIVE

## 2018-07-09 MED ORDER — ACETAMINOPHEN 325 MG PO TABS: 650.0000 mg | ORAL_TABLET | Freq: Four times a day (QID) | ORAL | Status: DC | PRN

## 2018-07-09 MED ORDER — HEPARIN SODIUM (PORCINE) 5000 UNIT/ML IJ SOLN: 5000.0000 [IU] | Freq: Three times a day (TID) | INTRAMUSCULAR | Status: DC

## 2018-07-09 MED ORDER — ONDANSETRON HCL 4 MG PO TABS: 4.0000 mg | ORAL_TABLET | Freq: Four times a day (QID) | ORAL | Status: DC | PRN

## 2018-07-09 MED ORDER — CHLORHEXIDINE GLUCONATE CLOTH 2 % EX PADS: 6.0000 | MEDICATED_PAD | Freq: Every day | CUTANEOUS | Status: DC

## 2018-07-09 MED ORDER — CARVEDILOL 12.5 MG PO TABS: 25.0000 mg | ORAL_TABLET | Freq: Two times a day (BID) | ORAL | Status: DC

## 2018-07-09 MED ORDER — MINOXIDIL 2.5 MG PO TABS: 5.0000 mg | ORAL_TABLET | Freq: Two times a day (BID) | ORAL | Status: DC

## 2018-07-09 MED ORDER — ACETAMINOPHEN 650 MG RE SUPP: 650.0000 mg | Freq: Four times a day (QID) | RECTAL | Status: DC | PRN

## 2018-07-09 MED ORDER — AMLODIPINE BESYLATE 10 MG PO TABS: 10.0000 mg | ORAL_TABLET | Freq: Every day | ORAL | Status: DC

## 2018-07-09 MED ORDER — SEVELAMER CARBONATE 800 MG PO TABS: 1600.0000 mg | ORAL_TABLET | Freq: Three times a day (TID) | ORAL | Status: DC

## 2018-07-09 MED ORDER — ONDANSETRON HCL 4 MG/2ML IJ SOLN: 4.0000 mg | Freq: Four times a day (QID) | INTRAMUSCULAR | Status: DC | PRN

## 2018-07-09 MED ORDER — HYDRALAZINE HCL 100 MG PO TABS: 100.0000 mg | ORAL_TABLET | Freq: Three times a day (TID) | ORAL | Status: DC

## 2018-07-09 MED ORDER — SPIRONOLACTONE 50 MG PO TABS: 50.0000 mg | ORAL_TABLET | Freq: Every day | ORAL | Status: DC

## 2018-07-09 NOTE — Progress Notes (Signed)
Treatment complete goal met. Vitals stable. IV removed from Right forearm. Patient departed unit via wheelchair to a waiting car out side of ED.

## 2018-07-09 NOTE — ED Provider Notes (Signed)
[facility] EMERGENCY DEPARTMENT Provider Note   CSN: [id number] Arrival date & time: 07/08/18  2334     History   Chief Complaint Chief Complaint  Patient presents with  . Shortness of Breath    HPI Madeline Brown is a 43 y.o. female.  HPI  43 year old female with history of end-stage renal disease on hemodialysis last HD was earlier in the day, hypertension who comes in with chief complaint of shortness of breath.  Patient states that she was feeling well and went to have [place] food for dinner.  Within a few minutes of finishing her dinner she started having sudden onset shortness of breath.  Patient denies any history of lung disease and has no new productive cough with green phlegm.  She also denies any recent flulike symptoms, fevers, body aches, URI-like symptoms.  Patient arrived to the ER hypoxic with O2 sats in the 70s. Review of system is negative for any new chest pain.  Patient denies any substance abuse.  Past Medical History:  Diagnosis Date  . Chronic kidney disease   . Hypertension     Patient Active Problem List   Diagnosis Date Noted  . Acute pulmonary edema ([place]) 07/09/2018  . ESRD (end stage renal disease) ([place]) 06/24/2017  . Hypertensive emergency 06/19/2017  . Hypertensive crisis   . Acute renal failure superimposed on chronic kidney disease ([place])   . Epistaxis   . Hypertension 03/23/2017  . 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 Abdominal pain 02/14/2017  . Thrombocytopenia ([place])   . ARF (acute renal failure) ([place]) 02/12/2017  . Anemia 02/12/2017  . Hypokalemia 02/12/2017  . Elevated troponin 02/12/2017  . Hyponatremia 02/12/2017  . Tachycardia 02/12/2017  . Epigastric pain     Past Surgical History:  Procedure Laterality Date  . AV FISTULA PLACEMENT Left 06/24/2017   Procedure: CREATION OF LEFT ARM BRACHIALCEPHALIC  ARTERIOVENOUS (AV) FISTULA;  Surgeon: Rosetta Posner, MD;  Location: [facility];   Service: Vascular;  Laterality: Left;  . CESAREAN SECTION     x2  . FISTULA SUPERFICIALIZATION Left 10/28/2017   Procedure: SUPERFICIALIZATION LEFT BRACHIOCEPHALIC ARTERIOVENOUS FISTULA;  Surgeon: Rosetta Posner, MD;  Location: [facility];  Service: Vascular;  Laterality: Left;  . INSERTION OF DIALYSIS CATHETER Right 06/24/2017   Procedure: INSERTION OF TUNNELED DIALYSIS CATHETER;  Surgeon: Rosetta Posner, MD;  Location: [facility];  Service: Vascular;  Laterality: Right;  . LIGATION OF COMPETING BRANCHES OF ARTERIOVENOUS FISTULA  10/28/2017   Procedure: LIGATION OF COMPETING BRANCHES OF ARTERIOVENOUS FISTULA x3;  Surgeon: Rosetta Posner, MD;  Location: [facility];  Service: Vascular;;     OB History    Gravida  8   Para  5   Term  5   Preterm      AB  2   Living  5     SAB      TAB  2   Ectopic      Multiple      Live Births  5            Home Medications    Prior to Admission medications   Medication Sig Start Date End Date Taking? Authorizing Provider  amLODipine (NORVASC) 10 MG tablet Take 10 mg by mouth at bedtime.   Yes [provider]  carvedilol (COREG) 25 MG tablet Take 1 tablet (25 mg total) by mouth 2 (two) times daily with a meal. 06/27/17 10/23/19 Yes Shelly Coss, MD  hydrALAZINE (  APRESOLINE) 100 MG tablet Take 1 tablet (100 mg total) by mouth every 8 (eight) hours. 06/27/17 10/23/18 Yes Adhikari, Tamsen Meek, MD  minoxidil (LONITEN) 2.5 MG tablet Take 5 mg by mouth 2 (two) times daily. 01/22/18  Yes [provider]  RENAGEL 800 MG tablet Take 1,600 mg by mouth 3 (three) times daily with meals. 08/26/17  Yes [provider]  spironolactone (ALDACTONE) 50 MG tablet Take 50 mg by mouth daily.   Yes [provider]  cloNIDine (CATAPRES) 0.2 MG tablet Take 1 tablet (0.2 mg total) by mouth 3 (three) times daily. Patient not taking: Reported on 07/09/2018 06/27/17 10/23/18  Shelly Coss, MD  omeprazole (PRILOSEC) 20 MG capsule Take 1 capsule (20 mg total)  by mouth daily. Patient not taking: Reported on 07/09/2018 03/10/18   Rancour, Annie Main, MD  polyethylene glycol [facility]) packet Take 17 g by mouth daily. Patient not taking: Reported on 07/09/2018 03/10/18   Ezequiel Essex, MD  traMADol (ULTRAM) 50 MG tablet Take 1 tablet (50 mg total) by mouth every 6 (six) hours as needed. Patient not taking: Reported on 03/10/2018 10/28/17   Gabriel Earing, PA-C    Family History Family History  Problem Relation Age of Onset  . Diabetes Father   . Hypertension Father   . Cancer Father     Social History Social History   Tobacco Use  . Smoking status: Current Every Day Smoker    Packs/day: 0.25    Years: 15.00    Pack years: 3.75    Types: Cigarettes  . Smokeless tobacco: Never Used  . Tobacco comment: 4 cigarettes per day  Substance Use Topics  . Alcohol use: Yes    Comment: occassional beer  . Drug use: Yes    Types: Marijuana     Allergies   Visine [tetrahydrozoline hcl]   Review of Systems Review of Systems  Constitutional: Positive for activity change. Negative for fever.  HENT: Negative for congestion.   Respiratory: Positive for cough and shortness of breath.   Gastrointestinal: Negative for nausea and vomiting.  Hematological: Does not bruise/bleed easily.  All other systems reviewed and are negative.    Physical Exam Updated Vital Signs BP 124/79   Pulse 93   Temp 99.4 F (37.4 C) (Oral)   Resp (!) 33   Ht 5' (1.524 m)   Wt 59 kg   SpO2 99%   BMI 25.40 kg/m   Physical Exam Vitals signs and nursing note reviewed.  Constitutional:      Appearance: She is well-developed.  HENT:     Head: Normocephalic and atraumatic.  Neck:     Musculoskeletal: Normal range of motion and neck supple.  Cardiovascular:     Rate and Rhythm: Normal rate.  Pulmonary:     Effort: Pulmonary effort is normal.     Breath sounds: Examination of the right-lower field reveals decreased breath sounds and rales. Examination of the  left-lower field reveals decreased breath sounds. Decreased breath sounds and rales present.  Abdominal:     General: Bowel sounds are normal.  Musculoskeletal:     Right lower leg: No edema.     Left lower leg: No edema.  Skin:    General: Skin is warm and dry.  Neurological:     Mental Status: She is alert and oriented to person, place, and time.      ED Treatments / Results  Labs (all labs ordered are listed, but only abnormal results are displayed) Labs Reviewed  BASIC METABOLIC PANEL - Abnormal; Notable for the following components:      Result Value   Potassium 3.3 (*)    Glucose, Bld 115 (*)    Creatinine, Ser 3.38 (*)    Calcium 8.7 (*)    GFR calc non Af Amer 16 (*)    GFR calc Af Amer 19 (*)    All other components within normal limits  CBC WITH DIFFERENTIAL/PLATELET - Abnormal; Notable for the following components:   RBC 3.47 (*)    Hemoglobin 10.6 (*)    HCT 34.0 (*)    Platelets 79 (*)    Lymphs Abs 0.6 (*)    All other components within normal limits    EKG EKG Interpretation  Date/Time:  Thursday July 08 2018 23:37:59 EST Ventricular Rate:  111 PR Interval:    QRS Duration: 72 QT Interval:  333 QTC Calculation: 453 R Axis:   139 Text Interpretation:  Sinus or ectopic atrial tachycardia Low voltage, extremity and precordial leads Minimal ST elevation, inferior leads No acute changes No significant change since last tracing Confirmed by Varney Biles ([id number]) on 07/09/2018 12:01:29 AM   Radiology Dg Chest Port 1 View  Result Date: 07/09/2018 CLINICAL DATA:  Shortness of breath and cough for 1 day. History of end-stage renal disease on dialysis. EXAM: PORTABLE CHEST 1 VIEW COMPARISON:  Chest radiograph March 10, 2018 FINDINGS: Moderate to severe cardiomegaly, increased from prior examination. Interstitial prominence with patchy bibasilar airspace opacities. Small bilateral pleural effusions. No pneumothorax. Soft tissue planes and included  osseous structures are non suspicious. IMPRESSION: 1. Worsening cardiomegaly with interstitial prominence suspicious for pulmonary edema. 2. Bibasilar confluent edema versus pneumonia with small pleural effusions. Electronically Signed   By: Elon Alas M.D.   On: 07/09/2018 00:43    Procedures .Critical Care Performed by: Varney Biles, MD Authorized by: Varney Biles, MD   Critical care provider statement:    Critical care time (minutes):  31   Critical care time was exclusive of:  Separately billable procedures and treating other patients   Critical care was necessary to treat or prevent imminent or life-threatening deterioration of the following conditions:  Metabolic crisis and respiratory failure   Critical care was time spent personally by me on the following activities:  Discussions with consultants, evaluation of patient's response to treatment, examination of patient, ordering and performing treatments and interventions, ordering and review of laboratory studies, ordering and review of radiographic studies, pulse oximetry, re-evaluation of patient's condition, obtaining history from patient or surrogate and review of old charts   (including critical care time)  Medications Ordered in ED Medications - No data to display   Initial Impression / Assessment and Plan / ED Course  I have reviewed the triage vital signs and the nursing notes.  Pertinent labs & imaging results that were available during my care of the patient were reviewed by me and considered in my medical decision making (see chart for details).     43 year old female comes in with chief complaint of shortness of breath. She arrives in hypoxic respiratory failure.  Patient is placed on nonrebreather 15 L O2, and her O2 sats have improved to 100%.  She is tachypneic without any significant respiratory distress.  On exam she is noted to have right-sided rales and poor aeration at the base.  She has history of  ESRD and is up-to-date.  Clinical suspicion based on history and exam are for flash pulmonary edema.  Patient's blood pressure is  actually within normal limits.  X-ray ordered and it confirms pulmonary edema.  Clinically there is no pneumonia or influenza based on history and exam. I spoke with the nephrologist on-call, they will see the patient and decide on dialysis time. Medicine will admit.  Final Clinical Impressions(s) / ED Diagnoses   Final diagnoses:  Acute hypoxemic respiratory failure ([place])  Acute pulmonary edema [facility])    ED Discharge Orders    None       Varney Biles, MD 07/09/18 [phone]

## 2018-07-09 NOTE — Discharge Summary (Signed)
Physician Discharge Summary  Madeline Brown [id number]:[id number] DOB: 06-10-1976 DOA: 07/08/2018  PCP: Patient, No Pcp Per  Admit date: 07/08/2018 Discharge date: 07/09/2018  Time spent: 45 minutes  Recommendations for Outpatient Follow-up:  Patient will be discharged to home.  Patient will need to follow up with primary care provider within one week of discharge.  Continue hemodialysis as scheduled.  Patient should continue medications as prescribed.  Patient should follow a renal diet with fluid restriction 1200 mL's per day.   Discharge Diagnoses:  Principal Problem:   Acute respiratory failure with hypoxia secondary to acute pulmonary edema (HCC) Active Problems:   Hypertension   ESRD (end stage renal disease) (HCC)   Anemia of chronic disease   Thrombocytopenia  Discharge Condition: Stable  Diet recommendation: Renal diet with fluid restriction  Filed Weights   07/08/18 2340 07/09/18 0630 07/09/18 1102  Weight: 59 kg 59.3 kg 55 kg    History of present illness:  On 07/09/2018 by Dr. Gean Birchwood Madeline Brown is a 43 y.o. female with history of ESRD on hemodialysis on Tuesday Thursday Saturday, hypertension, anemia presents to the ER after patient started having sudden onset of shortness of breath last night.  Patient states she was eating fried rice and had used a lot of soy sauce and had fried rice and shortly after 1 hour started having shortness of breath.  Denies any chest pain productive cough fever chills.  Patient did have her dialysis done earlier yesterday.  Hospital Course:  Acute respiratory failure with hypoxia secondary to acute pulmonary edema -Prior to arrival to the emergency department, patient was found to be hypoxic with O2 saturations in the 70s.  She did require a nonrebreather. -Chest x-ray reviewed and consistent with pulmonary edema -Nephrology consulted and appreciated, patient dialyzed today. -Discussed with Dr. Jonnie Finner, nephrology, patient has had  4L off during HD -Shortness of breath has improved, patient feeling much better today. -Currently patient is able to speak in full sentences  End-stage renal disease -Dialysis recently started earlier this month -Patient's last dialysis treatment was 07/08/2018 -Nephrology was consulted and appreciated.  Patient underwent hemodialysis treatment today and should continue regular scheduled tomorrow -It seems that she was noncompliant with her diet, and had [place] food yesterday evening -Discussed the importance of low-sodium and fluid restriction with patient  Essential hypertension -Continue minoxidil, amlodipine, Coreg, spironolactone, hydralazine  Thrombocytopenia -Appears to be acute -unknown etiology -Discussed with nephrology, HIT panel was ordered however not drawn prior to patient leaving the hospital. Discussed with Dr. Patel-Dr. Jonnie Finner, and Dr. Joelyn Oms, HIT panel will be drawn as an outpatient. -Repeat CBC in one week, may need referral to hematologist as an outpatient  Anemia of chronic disease -Hemoglobin appears to be stable and at baseline  Procedures: None  Consultations: Nephrology  Discharge Exam: Vitals:   07/09/18 1100 07/09/18 1102  BP: 125/82 133/83  Pulse: 96 92  Resp:  (!) 23  Temp:  98 F (36.7 C)  SpO2:  98%   Patient no longer feeling short of breath.  Feels breathing has improved after initiation of hemodialysis.  Denies current chest pain, cough, fever, abdominal pain, nausea or vomiting, diarrhea or constipation, dizziness or headache.   General: Well developed, well nourished, NAD, appears stated age  76: NCAT, mucous membranes moist.  Neck: Supple  Cardiovascular: S1 S2 auscultated, no murmur, RRR  Respiratory: Clear to auscultation bilaterally with equal chest rise  Abdomen: Soft, nontender, nondistended, + bowel sounds  Extremities: warm dry without cyanosis  clubbing   Neuro: AAOx3, nonfocal  Psych: Appropriate mood and  affect, pleasant  Discharge Instructions  Allergies as of 07/09/2018      Reactions   Visine [tetrahydrozoline Hcl] Swelling   Eyes Swelling.      Medication List    STOP taking these medications   cloNIDine 0.2 MG tablet Commonly known as:  CATAPRES   omeprazole 20 MG capsule Commonly known as:  PRILOSEC   polyethylene glycol packet Commonly known as:  MIRALAX   traMADol 50 MG tablet Commonly known as:  ULTRAM     TAKE these medications   amLODipine 10 MG tablet Commonly known as:  NORVASC Take 10 mg by mouth at bedtime.   carvedilol 25 MG tablet Commonly known as:  COREG Take 1 tablet (25 mg total) by mouth 2 (two) times daily with a meal.   hydrALAZINE 100 MG tablet Commonly known as:  APRESOLINE Take 1 tablet (100 mg total) by mouth every 8 (eight) hours.   minoxidil 2.5 MG tablet Commonly known as:  LONITEN Take 5 mg by mouth 2 (two) times daily.   RENAGEL 800 MG tablet Generic drug:  sevelamer Take 1,600 mg by mouth 3 (three) times daily with meals.   spironolactone 50 MG tablet Commonly known as:  ALDACTONE Take 50 mg by mouth daily.      Allergies  Allergen Reactions  . Visine [Tetrahydrozoline Hcl] Swelling    Eyes Swelling.   Follow-up Information    Primary care physician. Schedule an appointment as soon as possible for a visit in 1 week(s).   Why:  Hospital follow up           The results of significant diagnostics from this hospitalization (including imaging, microbiology, ancillary and laboratory) are listed below for reference.    Significant Diagnostic Studies: Dg Chest Port 1 View  Result Date: 07/09/2018 CLINICAL DATA:  Shortness of breath and cough for 1 day. History of end-stage renal disease on dialysis. EXAM: PORTABLE CHEST 1 VIEW COMPARISON:  Chest radiograph March 10, 2018 FINDINGS: Moderate to severe cardiomegaly, increased from prior examination. Interstitial prominence with patchy bibasilar airspace opacities.  Small bilateral pleural effusions. No pneumothorax. Soft tissue planes and included osseous structures are non suspicious. IMPRESSION: 1. Worsening cardiomegaly with interstitial prominence suspicious for pulmonary edema. 2. Bibasilar confluent edema versus pneumonia with small pleural effusions. Electronically Signed   By: Elon Alas M.D.   On: 07/09/2018 00:43    Microbiology: No results found for this or any previous visit (from the past 240 hour(s)).   Labs: Basic Metabolic Panel: Recent Labs  Lab 07/09/18 0053 07/09/18 0529  NA 139  --   K 3.3*  --   CL 98  --   CO2 32  --   GLUCOSE 115*  --   BUN 16  --   CREATININE 3.38* 3.87*  CALCIUM 8.7*  --    Liver Function Tests: No results for input(s): AST, ALT, ALKPHOS, BILITOT, PROT, ALBUMIN in the last 168 hours. No results for input(s): LIPASE, AMYLASE in the last 168 hours. No results for input(s): AMMONIA in the last 168 hours. CBC: Recent Labs  Lab 07/09/18 0053 07/09/18 0529  WBC 4.1 3.4*  NEUTROABS 3.1  --   HGB 10.6* 10.8*  HCT 34.0* 34.4*  MCV 98.0 98.9  PLT 79* 79*   Cardiac Enzymes: Recent Labs  Lab 07/09/18 0529  TROPONINI 0.03*   BNP: BNP (last 3 results) No results for input(s): BNP in the  last 8760 hours.  ProBNP (last 3 results) No results for input(s): PROBNP in the last 8760 hours.  CBG: No results for input(s): GLUCAP in the last 168 hours.     Signed:  Cristal Ford  [facility] 07/09/2018, 1:03 PM

## 2018-07-09 NOTE — Procedures (Signed)
   I was present at this dialysis session, have reviewed the session itself and made  appropriate changes Kelly Splinter MD [place] pager [phone]   07/09/2018, 10:34 AM

## 2018-07-09 NOTE — Progress Notes (Signed)
43 year old female ESRD at [facility] who presented today with acute onset of dyspnea.  Patient received hemodialysis on 1/23, and minimal intradialytic weight gain, achieved her estimated dry weight, and was normotensive upon discharge.  She prepared [place] food for herself this evening and then became acutely dyspneic.  EMS was called.  She was found to be hypoxic with an O2 sat in the 70s.  Blood pressures were not significantly elevated.  She was requiring a nonrebreather.  Chest x-ray consistent with pulmonary edema.  Labs with a potassium of 3.3, BUN 16.  At the time of my exam she is using a nonrebreather.  She is speaking in full sentences.  She denies fever, chills, productive cough, asymmetric edema in the legs or edema of the legs whatsoever.  No chest pain.  She has diminished bronchial breath sounds in the bases.  No peripheral edema.  Her left upper extremity AV fistula is tortuous but with bruit and thrill.  Plan will be for hemodialysis treatment first thing in the morning.  Will attempt to lower her postdialysis weight and establish new estimated dry weight.  Reassessment thereafter to see if she requires further treatment inpatient or as an outpatient.  Outpt HD order: [facility] using AV fistula, 2 potassium, 2 calcium, EDW 59 kg, treatment time 4 hours, target blood flow 400, 4000 units of heparin bolus every treatment

## 2018-07-09 NOTE — ED Notes (Signed)
Pt was discharged from dialysis. Pt has admission orders so ED RN tray called pt whom is home at this time to inform she needs to come back to hospital for direct admission. Admitting physician is aware and is also going to call patient. Bed placement also aware.

## 2018-07-09 NOTE — H&P (Signed)
History and Physical    Madeline Brown TFT:[id number] DOB: 26-May-1976 DOA: 07/08/2018  PCP: Patient, No Pcp Per  Patient coming from: Home.  Chief Complaint: Shortness of breath.  HPI: Madeline Brown is a 43 y.o. female with history of ESRD on hemodialysis on Tuesday Thursday Saturday, hypertension, anemia presents to the ER after patient started having sudden onset of shortness of breath last night.  Patient states she was eating fried rice and had used a lot of soy sauce and had fried rice and shortly after 1 hour started having shortness of breath.  Denies any chest pain productive cough fever chills.  Patient did have her dialysis done earlier yesterday.  ED Course: In the ER patient was in acute respiratory failure with hypoxia requiring initially nonrebreather.  Chest x-ray was showing congestion consistent with pulmonary edema.  EKG was showing sinus rhythm with nonspecific changes.  On-call nephrologist Dr. Baird Cancer has been consulted and dialysis has been arranged for pulmonary edema.  Review of Systems: As per HPI, rest all negative.   Past Medical History:  Diagnosis Date  . Chronic kidney disease   . Hypertension     Past Surgical History:  Procedure Laterality Date  . AV FISTULA PLACEMENT Left 06/24/2017   Procedure: CREATION OF LEFT ARM BRACHIALCEPHALIC  ARTERIOVENOUS (AV) FISTULA;  Surgeon: Rosetta Posner, MD;  Location: [facility];  Service: Vascular;  Laterality: Left;  . CESAREAN SECTION     x2  . FISTULA SUPERFICIALIZATION Left 10/28/2017   Procedure: SUPERFICIALIZATION LEFT BRACHIOCEPHALIC ARTERIOVENOUS FISTULA;  Surgeon: Rosetta Posner, MD;  Location: [facility];  Service: Vascular;  Laterality: Left;  . INSERTION OF DIALYSIS CATHETER Right 06/24/2017   Procedure: INSERTION OF TUNNELED DIALYSIS CATHETER;  Surgeon: Rosetta Posner, MD;  Location: [facility];  Service: Vascular;  Laterality: Right;  . LIGATION OF COMPETING BRANCHES OF ARTERIOVENOUS FISTULA  10/28/2017   Procedure:  LIGATION OF COMPETING BRANCHES OF ARTERIOVENOUS FISTULA x3;  Surgeon: Rosetta Posner, MD;  Location: [facility];  Service: Vascular;;     reports that she has been smoking cigarettes. She has a 3.75 pack-year smoking history. She has never used smokeless tobacco. She reports current alcohol use. She reports current drug use. Drug: Marijuana.  Allergies  Allergen Reactions  . Visine [Tetrahydrozoline Hcl] Swelling    Eyes Swelling.    Family History  Problem Relation Age of Onset  . Diabetes Father   . Hypertension Father   . Cancer Father     Prior to Admission medications   Medication Sig Start Date End Date Taking? Authorizing Provider  amLODipine (NORVASC) 10 MG tablet Take 10 mg by mouth at bedtime.   Yes [provider]  carvedilol (COREG) 25 MG tablet Take 1 tablet (25 mg total) by mouth 2 (two) times daily with a meal. 06/27/17 10/23/19 Yes Adhikari, Tamsen Meek, MD  hydrALAZINE (APRESOLINE) 100 MG tablet Take 1 tablet (100 mg total) by mouth every 8 (eight) hours. 06/27/17 10/23/18 Yes Adhikari, Tamsen Meek, MD  minoxidil (LONITEN) 2.5 MG tablet Take 5 mg by mouth 2 (two) times daily. 01/22/18  Yes [provider]  RENAGEL 800 MG tablet Take 1,600 mg by mouth 3 (three) times daily with meals. 08/26/17  Yes [provider]  spironolactone (ALDACTONE) 50 MG tablet Take 50 mg by mouth daily.   Yes [provider]  cloNIDine (CATAPRES) 0.2 MG tablet Take 1 tablet (0.2 mg total) by mouth 3 (three) times daily. Patient not taking: Reported on 07/09/2018 06/27/17  10/23/18  Shelly Coss, MD  omeprazole (PRILOSEC) 20 MG capsule Take 1 capsule (20 mg total) by mouth daily. Patient not taking: Reported on 07/09/2018 03/10/18   Rancour, Annie Main, MD  polyethylene glycol [facility]) packet Take 17 g by mouth daily. Patient not taking: Reported on 07/09/2018 03/10/18   Ezequiel Essex, MD  traMADol (ULTRAM) 50 MG tablet Take 1 tablet (50 mg total) by mouth every 6 (six) hours as  needed. Patient not taking: Reported on 03/10/2018 10/28/17   Gabriel Earing, PA-C    Physical Exam: Vitals:   07/09/18 0215 07/09/18 0230 07/09/18 0245 07/09/18 0300  BP: 126/80 130/88 (!) 138/91 133/86  Pulse: 94 92 92 94  Resp: (!) 27 (!) 28 (!) 23 (!) 24  Temp:      TempSrc:      SpO2: 99% 99% 99% 99%  Weight:      Height:          Constitutional: Moderately built and nourished. Vitals:   07/09/18 0215 07/09/18 0230 07/09/18 0245 07/09/18 0300  BP: 126/80 130/88 (!) 138/91 133/86  Pulse: 94 92 92 94  Resp: (!) 27 (!) 28 (!) 23 (!) 24  Temp:      TempSrc:      SpO2: 99% 99% 99% 99%  Weight:      Height:       Eyes: Anicteric no pallor. ENMT: No discharge from the ears eyes nose and mouth. Neck: No mass felt.  No neck rigidity but no JVD appreciated. Respiratory: No rhonchi or crepitations. Cardiovascular: S1-S2 heard. Abdomen: Soft nontender bowel sounds present. Musculoskeletal: No edema.  No joint effusion. Skin: No rash. Neurologic: Alert awake oriented to time place and person.  Moves all extremities. Psychiatric: Appears normal per normal affect.   Labs on Admission: I have personally reviewed following labs and imaging studies  CBC: Recent Labs  Lab 07/09/18 0053  WBC 4.1  NEUTROABS 3.1  HGB 10.6*  HCT 34.0*  MCV 98.0  PLT 79*   Basic Metabolic Panel: Recent Labs  Lab 07/09/18 0053  NA 139  K 3.3*  CL 98  CO2 32  GLUCOSE 115*  BUN 16  CREATININE 3.38*  CALCIUM 8.7*   GFR: Estimated Creatinine Clearance: 17.6 mL/min (A) (by C-G formula based on SCr of 3.38 mg/dL (H)). Liver Function Tests: No results for input(s): AST, ALT, ALKPHOS, BILITOT, PROT, ALBUMIN in the last 168 hours. No results for input(s): LIPASE, AMYLASE in the last 168 hours. No results for input(s): AMMONIA in the last 168 hours. Coagulation Profile: No results for input(s): INR, PROTIME in the last 168 hours. Cardiac Enzymes: No results for input(s): CKTOTAL,  CKMB, CKMBINDEX, TROPONINI in the last 168 hours. BNP (last 3 results) No results for input(s): PROBNP in the last 8760 hours. HbA1C: No results for input(s): HGBA1C in the last 72 hours. CBG: No results for input(s): GLUCAP in the last 168 hours. Lipid Profile: No results for input(s): CHOL, HDL, LDLCALC, TRIG, CHOLHDL, LDLDIRECT in the last 72 hours. Thyroid Function Tests: No results for input(s): TSH, T4TOTAL, FREET4, T3FREE, THYROIDAB in the last 72 hours. Anemia Panel: No results for input(s): VITAMINB12, FOLATE, FERRITIN, TIBC, IRON, RETICCTPCT in the last 72 hours. Urine analysis:    Component Value Date/Time   COLORURINE YELLOW 03/09/2018 [place] 03/09/2018 2314   LABSPEC 1.014 03/09/2018 2314   PHURINE 5.0 03/09/2018 2314   GLUCOSEU NEGATIVE 03/09/2018 2314   HGBUR NEGATIVE 03/09/2018 2314   BILIRUBINUR NEGATIVE  03/09/2018 [place] 03/09/2018 2314   PROTEINUR 100 (A) 03/09/2018 2314   UROBILINOGEN 0.2 06/17/2016 1257   NITRITE NEGATIVE 03/09/2018 2314   LEUKOCYTESUR NEGATIVE 03/09/2018 2314   Sepsis Labs: @LABRCNTIP (procalcitonin:4,lacticidven:4) )No results found for this or any previous visit (from the past 240 hour(s)).   Radiological Exams on Admission: Dg Chest Port 1 View  Result Date: 07/09/2018 CLINICAL DATA:  Shortness of breath and cough for 1 day. History of end-stage renal disease on dialysis. EXAM: PORTABLE CHEST 1 VIEW COMPARISON:  Chest radiograph March 10, 2018 FINDINGS: Moderate to severe cardiomegaly, increased from prior examination. Interstitial prominence with patchy bibasilar airspace opacities. Small bilateral pleural effusions. No pneumothorax. Soft tissue planes and included osseous structures are non suspicious. IMPRESSION: 1. Worsening cardiomegaly with interstitial prominence suspicious for pulmonary edema. 2. Bibasilar confluent edema versus pneumonia with small pleural effusions. Electronically Signed    By: Elon Alas M.D.   On: 07/09/2018 00:43    EKG: Independently reviewed.  Sinus rhythm.  Nonspecific ST changes.  Assessment/Plan Principal Problem:   Acute pulmonary edema (HCC) Active Problems:   Hypertension   ESRD (end stage renal disease) ([place])    1. Acute respiratory failure with hypoxia secondary to acute pulmonary edema -appreciate nephrology consult.  Patient is being arranged for dialysis.  Closely monitor respiratory status after dialysis.  Suspect patient's pulmonary edema from dietary noncompliance. 2. Hypertension on multiple medications including minoxidil, amlodipine, carvedilol, spironolactone. 3. Thrombocytopenia -appears to be new.  Closely follow CBC. 4. Anemia appears to be chronic likely from ESRD follow CBC.   DVT prophylaxis: SCD since patient has some thrombocytopenia. Code Status: Full code. Family Communication: Discussed with patient. Disposition Plan: Home. Consults called: Nephrology. Admission status: Observation.   Rise Patience MD [facility] Pager [phone].  If 7PM-7AM, please contact night-coverage [email or web address] Password [email or web address]  07/09/2018, 5:05 AM

## 2018-07-09 NOTE — Discharge Instructions (Signed)
Acute Respiratory Failure, Adult ° °Acute respiratory failure occurs when there is not enough oxygen passing from your lungs to your body. When this happens, your lungs have trouble removing carbon dioxide from the blood. This causes your blood oxygen level to drop too low as carbon dioxide builds up. °Acute respiratory failure is a medical emergency. It can develop quickly, but it is temporary if treated promptly. Your lung capacity, or how much air your lungs can hold, may improve with time, exercise, and treatment. °What are the causes? °There are many possible causes of acute respiratory failure, including: °· Lung injury. °· Chest injury or damage to the ribs or tissues near the lungs. °· Lung conditions that affect the flow of air and blood into and out of the lungs, such as pneumonia, acute respiratory distress syndrome, and cystic fibrosis. °· Medical conditions, such as strokes or spinal cord injuries, that affect the muscles and nerves that control breathing. °· Blood infection (sepsis). °· Inflammation of the pancreas (pancreatitis). °· A blood clot in the lungs (pulmonary embolism). °· A large-volume blood transfusion. °· Burns. °· Near-drowning. °· Seizure. °· Smoke inhalation. °· Reaction to medicines. °· Alcohol or drug overdose. °What increases the risk? °This condition is more likely to develop in people who have: °· A blocked airway. °· Asthma. °· A condition or disease that damages or weakens the muscles, nerves, bones, or tissues that are involved in breathing. °· A serious infection. °· A health problem that blocks the unconscious reflex that is involved in breathing, such as hypothyroidism or sleep apnea. °· A lung injury or trauma. °What are the signs or symptoms? °Trouble breathing is the main symptom of acute respiratory failure. Symptoms may also include: °· Rapid breathing. °· Restlessness or anxiety. °· Skin, lips, or fingernails that appear blue (cyanosis). °· Rapid heart  rate. °· Abnormal heart rhythms (arrhythmias). °· Confusion or changes in behavior. °· Tiredness or loss of energy. °· Feeling sleepy or having a loss of consciousness. °How is this diagnosed? °Your health care provider can diagnose acute respiratory failure with a medical history and physical exam. During the exam, your health care provider will listen to your heart and check for crackling or wheezing sounds in your lungs. Your may also have tests to confirm the diagnosis and determine what is causing respiratory failure. These tests may include: °· Measuring the amount of oxygen in your blood (pulse oximetry). The measurement comes from a small device that is placed on your finger, earlobe, or toe. °· Other blood tests to measure blood gases and to look for signs of infection. °· Sampling your cerebral spinal fluid or tracheal fluid to check for infections. °· Chest X-Dansby to look for fluid in spaces that should be filled with air. °· Electrocardiogram (ECG) to look at the heart's electrical activity. °How is this treated? °Treatment for this condition usually takes places in a hospital intensive care unit (ICU). Treatment depends on what is causing the condition. It may include one or more treatments until your symptoms improve. Treatment may include: °· Supplemental oxygen. Extra oxygen is given through a tube in the nose, a face mask, or a hood. °· A device such as a continuous positive airway pressure (CPAP) or bi-level positive airway pressure (BiPAP or BPAP) machine. This treatment uses mild air pressure to keep the airways open. A mask or other device will be placed over your nose or mouth. A tube that is connected to a motor will deliver oxygen through the   mask.  Ventilator. This treatment helps move air into and out of the lungs. This may be done with a bag and mask or a machine. For this treatment, a tube is placed in your windpipe (trachea) so air and oxygen can flow to the lungs.  Extracorporeal  membrane oxygenation (ECMO). This treatment temporarily takes over the function of the heart and lungs, supplying oxygen and removing carbon dioxide. ECMO gives the lungs a chance to recover. It may be used if a ventilator is not effective.  Tracheostomy. This is a procedure that creates a hole in the neck to insert a breathing tube.  Receiving fluids and medicines.  Rocking the bed to help breathing. Follow these instructions at home:  Take over-the-counter and prescription medicines only as told by your health care provider.  Return to normal activities as told by your health care provider. Ask your health care provider what activities are safe for you.  Keep all follow-up visits as told by your health care provider. This is important. How is this prevented? Treating infections and medical conditions that may lead to acute respiratory failure can help prevent the condition from developing. Contact a health care provider if:  You have a fever.  Your symptoms do not improve or they get worse. Get help right away if:  You are having trouble breathing.  You lose consciousness.  Your have cyanosis or turn blue.  You develop a rapid heart rate.  You are confused. These symptoms may represent a serious problem that is an emergency. Do not wait to see if the symptoms will go away. Get medical help right away. Call your local emergency services (911 in the [place] Do not drive yourself to the hospital. This information is not intended to replace advice given to you by your health care provider. Make sure you discuss any questions you have with your health care provider. Document Released: 06/07/2013 Document Revised: 12/29/2015 Document Reviewed: 12/19/2015 Elsevier Interactive Patient Education  2019 [employer]   Pulmonary Edema Pulmonary edema is a condition in which fluid collects in the air sacs of the lung. This makes it hard for the lungs to fill with air. It also prevents  the lungs from moving oxygen into the bloodstream, which can affect other organs, such as the brain and kidneys. Pulmonary edema is an emergency and should be treated immediately. There are two main types of pulmonary edema:  Cardiogenic. This means the pulmonary edema was caused by a problem with the heart.  Noncardiogenic. This means the pulmonary edema was caused by something other than the heart, such as an injury to the lung. What are the causes? This condition is commonly caused by heart failure. When this happens, the heart is not able to properly pump blood through the body. This can lead to increased pressure in the heart and blood building up in the veins around the lungs. When blood builds up in these veins, fluid gets pushed into the air sacs of the lung. Heart failure may be caused by:  Coronary artery disease.  High blood pressure.  Viral infection of the heart (myocarditis).  Leaky or stiff heart valves.  Irregular heartbeat (arrhythmia).  Fluid buildup caused by kidney problems. Other causes include:  Infection in the lungs (pneumonia), blood (sepsis), or other part of the body.  Severe injury to the chest.  Lung injury from heat or toxins, such as breathing in smoke or poisonous gas.  Inhaling vomit or water (pulmonaryaspiration).  Certain medicines.  High  altitude. What are the signs or symptoms? Symptoms of this condition include:  Shortness of breath.  Coughing with frothy or bloody mucus.  Wheezing.  Feeling like you cannot get enough air.  Shallow and fast breathing.  Skin that is cool and damp, and has a pale or bluish color. How is this diagnosed? This condition is diagnosed based on:  Your medical history.  A physical exam.  Your symptoms. You may also have other tests, including:  Chest X-ray.  Chest CT scan.  Blood tests, including checking the amount of oxygen in the blood.  Sputum culture. This test checks for infection in  the mucus that you cough up from your lungs.  Electrocardiogram. This measures the electrical signals of the heart.  Echocardiogram. This uses an ultrasound to evaluate the health of the heart. How is this treated? Initial treatment for this condition focuses on relieving your symptoms. Treatment depends on the underlying cause of the condition. This may include:  Oxygen therapy. The oxygen may be given through tubes in your nose or through a face mask. In severe cases, a breathing tube is inserted into the windpipe and hooked up to a breathing machine (ventilator).  Medicines. These may include medicines to: ? Help the body get rid of extra water (diuretics). ? Help the heart pump blood properly. ? Prevent or destroy blood clots. If poor heart function is the cause, treatment may also include:  Procedures to open blocked arteries, repair damaged heart valves, or remove some of the damaged heart muscle.  A pacemaker to help with heart function.  A procedure that uses electric shocks to regulate heart rate (cardioversion). If an infection is the cause, treatment may include antibiotic medicines. Follow these instructions at home: Medicines  Take over-the-counter and prescription medicines only as told by your health care provider.  If you were prescribed an antibiotic, take it as told by your health care provider. Do not stop taking the antibiotic even if you start to feel better.  Have a plan with information about each medicine you take. This should include: ? Why you take the medicine. ? Possible side effects. ? Best time of day to take it. ? Foods to take with it, or foods to avoid when taking it. ? When to call your health care provider.  Make a list of each medicine, vitamin, or herbal supplement you take. Keep the list with you at all times. Show it to your health care provider at each visit and before starting a new medicine. Update the list as you add or stop  medicines. Lifestyle   Exercise regularly as told by your health care provider. It is important to do it safely. You can do this by: ? Pacing your activities to avoid shortness of breath or chest pain. ? Resting for at least 1 hour before and after meals. ? Asking about cardiac rehabilitation programs. These may include education, exercise plans, and counseling.  Eat a heart-healthy diet that is low in salt (sodium), saturated fat, and cholesterol. Your health care provider may recommend foods that are high in fiber, such as fresh fruits and vegetables, whole grains, and beans.  Do not use any products that contain nicotine or tobacco, such as cigarettes and e-cigarettes. If you need help quitting, ask your health care provider. General instructions  Maintain a healthy weight.  Keep a record of your weight: ? Record your hospital or clinic weight. When you get home, compare it to your scale and record your  weight. ? Weigh yourself first thing each morning after you urinate and before you eat breakfast. Wear the same amount of clothing each time. Record the weights. ? Share your weight record with your health care provider. Daily weights are important in detecting the body's retention of excess fluid. ? Tell your health care provider right away if you gain weight quickly. Your medicines may need to be adjusted.  Check and record your blood pressure as often as told by your health care provider. Bring the records with you to clinic visits.  Consider therapy or joining a support group. This may help with any stress, fear, or anxiety.  Keep all follow-up visits as told by your health care provider. This is important. Get help right away if:  You gain weight quickly.  You have severe chest pain, especially if the pain is crushing or pressure-like and spreads to the arms, back, neck, or jaw.  You have more swelling in your hands, feet, ankles, or abdomen.  You have nausea.  You have  unusual sweating or your skin turns blue or pale.  Your shortness of breath gets worse.  You have dizziness, blurred vision, a headache, or unsteadiness.  Your blood pressure is higher than 180/120.  You cough up bloody mucus (sputum).  You cannot sleep because it is hard to breathe.  You feel a racing heart beat (palpitations).  You have anxiety or a feeling that you cannot get enough air. These symptoms may represent a serious problem that is an emergency. Do not wait to see if the symptoms will go away. Get medical help right away. Call your local emergency services (911 in the [place] Do not drive yourself to the hospital. Summary  Pulmonary edema is a condition in which fluid collects in the air sacs of your lungs. If left untreated, it can lead to a medical emergency.  This condition is most commonly caused by heart failure. Other causes can include infections or injury to the lungs.  Take over-the-counter and prescription medicines only as told by your health care provider. This information is not intended to replace advice given to you by your health care provider. Make sure you discuss any questions you have with your health care provider. Document Released: 08/23/2002 Document Revised: 08/13/2016 Document Reviewed: 08/13/2016 Elsevier Interactive Patient Education  2019 [place] Kidney Disease End-stage kidney disease occurs when the kidneys are so damaged that they cannot function and cannot get better. This condition may also be referred to as end-stage renal disease or ESRD. The kidneys are two organs that do many important jobs in the body, including:  Removing wastes and extra fluids from the blood.  Making hormones that maintain the amount of fluid in your tissues and blood vessels.  Maintaining the right amount of fluids and chemicals in the body. Without functioning kidneys, toxins build up in the blood and life-threatening complications can  occur. What are the causes? This condition usually occurs when a long-term (chronic) kidney disease gets worse and results in permanent damage to the kidneys. It may also be caused by sudden damage to the kidneys (acute kidney injury). Causes of this condition include:  Having a family history of chronic kidney disease (CKD).  Having chronic kidney disease for many years.  Chronic medical conditions that affect the kidneys, such as: ? Cardiovascular disease, including high blood pressure. ? Diabetes. ? Certain diseases that affect the body's disease-fighting (immune) system.  Overuse of over-the-counter pain medicines.  Being around  or being in contact with poisonous (toxic) substances. What increases the risk? The following factors may make you more likely to develop this condition:  Being older than 60.  Being female.  Being of African-American, Asian, Native American, [place], or Hispanic descent.  Smoking or a history of smoking.  Obesity. What are the signs or symptoms? Symptoms of this condition include:  Swelling (edema) of the face, legs, ankles, or feet.  Numbness, tingling, or loss of feeling in the hands or feet.  Tiredness (lethargy).  Nausea or vomiting.  Confusion, trouble concentrating, or loss of consciousness.  Chest pain.  Shortness of breath.  Passing little or no urine.  Muscle twitches and cramps, especially in the legs.  Dry, itchy skin.  Loss of appetite.  Pale skin due to anemia, including the skin and tissue around the eye (conjunctiva).  Headaches.  Abnormally dark or light skin.  Decrease in muscle size (muscle wasting).  Easy bruising.  Frequent hiccups.  Stopping of the monthly period in women.  Jerky movements (seizures). How is this diagnosed? This condition may be diagnosed based on:  A physical exam, including blood pressure measurements.  Urine tests.  Blood tests.  Imaging tests.  A test in which  a sample of tissue is removed from the kidneys to be examined under a microscope (kidney biopsy). How is this treated? This condition may be treated with:  A procedure that removes toxic wastes from the body (dialysis). There are two types of dialysis: ? Dialysis that is done through your abdomen (peritoneal dialysis). This may be done several times a day. ? Dialysis that is done by a machine (hemodialysis). This may be done several times a week.  Surgery to receive a new kidney (kidney transplant). In addition to having dialysis or a kidney transplant, you may need to take medicines:  To control high blood pressure (hypertension).  To control high cholesterol.  To treat diabetes.  To maintain healthy levels of minerals in the blood (electrolytes). You may also be given a specific meal plan to follow that includes requirements or limits for:  Salt (sodium).  Protein.  Phosphorous.  Potassium.  Calcium. Follow these instructions at home: Medicines  Take over-the-counter and prescription medicines only as told by your health care provider.  Do not take any new medicines, vitamins, or mineral supplements unless approved by your health care provider. Many medicines and supplements can worsen kidney damage.  Follow instructions from your health care provider about adjusting the doses of any medicines you take. Lifestyle  Do not use any products that contain nicotine or tobacco, such as cigarettes and e-cigarettes. If you need help quitting, ask your health care provider.  Achieve and maintain a healthy weight. If you need help with this, ask your health care provider.  Start or continue an exercise plan. Exercise at least 30 minutes a day, 5 days a week.  Follow your prescribed meal plan. General instructions  Stay current with your shots (immunizations) as told by your health care provider.  Keep track of your blood pressure. Report changes in your blood pressure as told  by your health care provider.  If you are being treated for diabetes, monitor and track your blood sugar (blood glucose) levels as told by your health care provider.  Keep all follow-up visits as told by your health care provider. This is important. Where to find more information  American Association of Kidney Patients: BombTimer.gl  National Kidney Foundation: www.kidney.[place]:  https://mathis.com/  [facility]: www.lifeoptions.org and www.kidneyschool.org Contact a health care provider if:  Your symptoms get worse.  You develop new symptoms. Get help right away if:  You have weakness in an arm or leg on one side of your body.  You have difficulty speaking or you are slurring your speech.  You have a sudden change in your vision.  You have a sudden, severe headache.  You have a sudden weight increase.  You have difficulty breathing.  Your symptoms suddenly get worse. Summary  End-stage kidney disease occurs when the kidneys are so damaged that they cannot function and cannot get better.  Without functioning kidneys, toxins build up in the blood and life-threatening complications can occur.  Treatment may include dialysis or a kidney transplant along with medicines and lifestyle changes. This information is not intended to replace advice given to you by your health care provider. Make sure you discuss any questions you have with your health care provider. Document Released: 08/23/2003 Document Revised: 07/08/2016 Document Reviewed: 07/08/2016 Elsevier Interactive Patient Education  2019 [employer].

## 2018-07-10 ENCOUNTER — Emergency Department (HOSPITAL_COMMUNITY): Payer: Medicare Other

## 2018-07-10 ENCOUNTER — Inpatient Hospital Stay (HOSPITAL_COMMUNITY)
Admission: EM | Admit: 2018-07-10 | Discharge: 2018-07-13 | DRG: 291 | Disposition: A | Discharge status: Home / Self Care | Payer: Medicare Other | Attending: Internal Medicine | Admitting: Internal Medicine

## 2018-07-10 ENCOUNTER — Encounter (HOSPITAL_COMMUNITY): Payer: Self-pay

## 2018-07-10 DIAGNOSIS — M898X9 Other specified disorders of bone, unspecified site: Secondary | ICD-10-CM | POA: Diagnosis present

## 2018-07-10 DIAGNOSIS — I272 Pulmonary hypertension, unspecified: Secondary | ICD-10-CM | POA: Diagnosis present

## 2018-07-10 DIAGNOSIS — Z888 Allergy status to other drugs, medicaments and biological substances status: Secondary | ICD-10-CM

## 2018-07-10 DIAGNOSIS — J189 Pneumonia, unspecified organism: Secondary | ICD-10-CM

## 2018-07-10 DIAGNOSIS — J9811 Atelectasis: Secondary | ICD-10-CM | POA: Diagnosis present

## 2018-07-10 DIAGNOSIS — Z8249 Family history of ischemic heart disease and other diseases of the circulatory system: Secondary | ICD-10-CM

## 2018-07-10 DIAGNOSIS — Z992 Dependence on renal dialysis: Secondary | ICD-10-CM | POA: Diagnosis present

## 2018-07-10 DIAGNOSIS — I132 Hypertensive heart and chronic kidney disease with heart failure and with stage 5 chronic kidney disease, or end stage renal disease: Principal | ICD-10-CM | POA: Diagnosis present

## 2018-07-10 DIAGNOSIS — J810 Acute pulmonary edema: Secondary | ICD-10-CM | POA: Diagnosis present

## 2018-07-10 DIAGNOSIS — I3139 Other pericardial effusion (noninflammatory): Secondary | ICD-10-CM

## 2018-07-10 DIAGNOSIS — D631 Anemia in chronic kidney disease: Secondary | ICD-10-CM | POA: Diagnosis present

## 2018-07-10 DIAGNOSIS — I5033 Acute on chronic diastolic (congestive) heart failure: Secondary | ICD-10-CM | POA: Diagnosis present

## 2018-07-10 DIAGNOSIS — D61818 Other pancytopenia: Secondary | ICD-10-CM | POA: Diagnosis present

## 2018-07-10 DIAGNOSIS — R0902 Hypoxemia: Secondary | ICD-10-CM

## 2018-07-10 DIAGNOSIS — N2581 Secondary hyperparathyroidism of renal origin: Secondary | ICD-10-CM | POA: Diagnosis present

## 2018-07-10 DIAGNOSIS — N186 End stage renal disease: Secondary | ICD-10-CM | POA: Diagnosis present

## 2018-07-10 DIAGNOSIS — Z809 Family history of malignant neoplasm, unspecified: Secondary | ICD-10-CM

## 2018-07-10 DIAGNOSIS — Z833 Family history of diabetes mellitus: Secondary | ICD-10-CM

## 2018-07-10 DIAGNOSIS — I313 Pericardial effusion (noninflammatory): Secondary | ICD-10-CM | POA: Diagnosis present

## 2018-07-10 DIAGNOSIS — I10 Essential (primary) hypertension: Secondary | ICD-10-CM | POA: Diagnosis present

## 2018-07-10 DIAGNOSIS — F17210 Nicotine dependence, cigarettes, uncomplicated: Secondary | ICD-10-CM | POA: Diagnosis present

## 2018-07-10 DIAGNOSIS — J9601 Acute respiratory failure with hypoxia: Secondary | ICD-10-CM | POA: Diagnosis present

## 2018-07-10 LAB — CBC WITH DIFFERENTIAL/PLATELET
Abs Immature Granulocytes: 0.01 10*3/uL (ref 0.00–0.07)
Basophils Absolute: 0 10*3/uL (ref 0.0–0.1)
Basophils Relative: 1 %
EOS PCT: 2 %
Eosinophils Absolute: 0.1 10*3/uL (ref 0.0–0.5)
HEMATOCRIT: 36.4 % (ref 36.0–46.0)
Hemoglobin: 11.3 g/dL — ABNORMAL LOW (ref 12.0–15.0)
Immature Granulocytes: 0 %
LYMPHS ABS: 0.8 10*3/uL (ref 0.7–4.0)
LYMPHS PCT: 22 %
MCH: 30.2 pg (ref 26.0–34.0)
MCHC: 31 g/dL (ref 30.0–36.0)
MCV: 97.3 fL (ref 80.0–100.0)
Monocytes Absolute: 0.3 10*3/uL (ref 0.1–1.0)
Monocytes Relative: 8 %
Neutro Abs: 2.6 10*3/uL (ref 1.7–7.7)
Neutrophils Relative %: 67 %
Platelets: 51 10*3/uL — ABNORMAL LOW (ref 150–400)
RBC: 3.74 MIL/uL — ABNORMAL LOW (ref 3.87–5.11)
RDW: 14.7 % (ref 11.5–15.5)
WBC: 3.9 10*3/uL — ABNORMAL LOW (ref 4.0–10.5)
nRBC: 0 % (ref 0.0–0.2)

## 2018-07-10 LAB — BASIC METABOLIC PANEL
Anion gap: 8 (ref 5–15)
BUN: 13 mg/dL (ref 6–20)
CO2: 32 mmol/L (ref 22–32)
Calcium: 9 mg/dL (ref 8.9–10.3)
Chloride: 95 mmol/L — ABNORMAL LOW (ref 98–111)
Creatinine, Ser: 2.8 mg/dL — ABNORMAL HIGH (ref 0.44–1.00)
GFR calc non Af Amer: 20 mL/min — ABNORMAL LOW (ref >60)
GFR, EST AFRICAN AMERICAN: 23 mL/min — AB (ref >60)
Glucose, Bld: 123 mg/dL — ABNORMAL HIGH (ref 70–99)
Potassium: 3.5 mmol/L (ref 3.5–5.1)
Sodium: 135 mmol/L (ref 135–145)

## 2018-07-10 LAB — TROPONIN I: Troponin I: 0.03 ng/mL (ref <0.03)

## 2018-07-10 LAB — HEPATITIS B CORE ANTIBODY, TOTAL: Hep B Core Total Ab: POSITIVE — AB

## 2018-07-10 LAB — HEPATITIS B SURFACE ANTIGEN: Hepatitis B Surface Ag: NEGATIVE

## 2018-07-10 LAB — HEPATITIS B SURFACE ANTIBODY,QUALITATIVE: Hep B S Ab: REACTIVE

## 2018-07-10 MED ORDER — SODIUM CHLORIDE 0.9 % IV SOLN
1.0000 g | Freq: Once | INTRAVENOUS | Status: AC
  Administered 2018-07-11: 1 g via INTRAVENOUS
  Filled 2018-07-10: qty 10

## 2018-07-10 MED ORDER — SODIUM CHLORIDE 0.9 % IV SOLN
500.0000 mg | Freq: Once | INTRAVENOUS | Status: AC
  Administered 2018-07-11: 500 mg via INTRAVENOUS
  Filled 2018-07-10: qty 500

## 2018-07-10 NOTE — ED Provider Notes (Signed)
[facility] EMERGENCY DEPARTMENT Provider Note   CSN: [id number] Arrival date & time: 07/10/18  2203     History   Chief Complaint Chief Complaint  Patient presents with  . Shortness of Breath    HPI Madeline Brown is a 43 y.o. female past medical history of chronic kidney disease, hypertension, pulmonary edema, acute respiratory failure who presents for evaluation of shortness of breath.  Patient reports that she has had shortness of breath over last several days.  Patient had not been going to dialysis.  She was seen here on 07/09/17 and was admitted for dialysis and shortness of breath.  She had dialysis done yesterday as well as today.  Patient reports that she started having shortness of breath again today early this morning.  She states it is worse with exertion. She states she stayed at home to see if it would go away.  She reports that at dialysis, they put her on oxygen but did not help.  Patient states she does not have any oxygen requirement normally.  She states she is also had some cough but states cough is nonproductive. She has also noted some recent congestion.  She has not noted any fevers.  Patient states she is not having any chest pain.  Patient denies any abdominal pain, nausea/vomiting.  States she has had some bilateral lower extremity swelling but states that they are symmetric and has not had any overlying warmth, erythema.  She denies any OCP use, recent immobilization, prior history of DVT/PE, recent surgery, or long travel.  The history is provided by the patient.    Past Medical History:  Diagnosis Date  . Chronic kidney disease   . Hypertension     Patient Active Problem List   Diagnosis Date Noted  . Acute pulmonary edema ([place]) 07/09/2018  . ESRD (end stage renal disease) ([place]) 06/24/2017  . Hypertensive emergency 06/19/2017  . Hypertensive crisis   . Acute renal failure superimposed on chronic kidney disease ([place])   . Epistaxis   .  Hypertension 03/23/2017  . 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 Abdominal pain 02/14/2017  . Thrombocytopenia ([place])   . ARF (acute renal failure) ([place]) 02/12/2017  . Anemia 02/12/2017  . Hypokalemia 02/12/2017  . Elevated troponin 02/12/2017  . Hyponatremia 02/12/2017  . Tachycardia 02/12/2017  . Epigastric pain     Past Surgical History:  Procedure Laterality Date  . AV FISTULA PLACEMENT Left 06/24/2017   Procedure: CREATION OF LEFT ARM BRACHIALCEPHALIC  ARTERIOVENOUS (AV) FISTULA;  Surgeon: Rosetta Posner, MD;  Location: [facility];  Service: Vascular;  Laterality: Left;  . CESAREAN SECTION     x2  . FISTULA SUPERFICIALIZATION Left 10/28/2017   Procedure: SUPERFICIALIZATION LEFT BRACHIOCEPHALIC ARTERIOVENOUS FISTULA;  Surgeon: Rosetta Posner, MD;  Location: [facility];  Service: Vascular;  Laterality: Left;  . INSERTION OF DIALYSIS CATHETER Right 06/24/2017   Procedure: INSERTION OF TUNNELED DIALYSIS CATHETER;  Surgeon: Rosetta Posner, MD;  Location: [facility];  Service: Vascular;  Laterality: Right;  . LIGATION OF COMPETING BRANCHES OF ARTERIOVENOUS FISTULA  10/28/2017   Procedure: LIGATION OF COMPETING BRANCHES OF ARTERIOVENOUS FISTULA x3;  Surgeon: Rosetta Posner, MD;  [facility];  Service: Vascular;;     OB History    Gravida  8   Para  5   Term  5   Preterm      AB  2   Living  5     SAB  TAB  2   Ectopic      Multiple      Live Births  5            Home Medications    Prior to Admission medications   Medication Sig Start Date End Date Taking? Authorizing Provider  amLODipine (NORVASC) 10 MG tablet Take 10 mg by mouth at bedtime.   Yes [provider]  carvedilol (COREG) 25 MG tablet Take 1 tablet (25 mg total) by mouth 2 (two) times daily with a meal. 06/27/17 10/23/19 Yes Adhikari, Tamsen Meek, MD  hydrALAZINE (APRESOLINE) 100 MG tablet Take 1 tablet (100 mg total) by mouth every 8 (eight) hours. 06/27/17 10/23/18 Yes Adhikari, Tamsen Meek, MD    minoxidil (LONITEN) 2.5 MG tablet Take 5 mg by mouth 2 (two) times daily. 01/22/18  Yes [provider]  RENAGEL 800 MG tablet Take 1,600 mg by mouth 3 (three) times daily with meals. 08/26/17  Yes [provider]  spironolactone (ALDACTONE) 50 MG tablet Take 50 mg by mouth daily.   Yes [provider]    Family History Family History  Problem Relation Age of Onset  . Diabetes Father   . Hypertension Father   . Cancer Father     Social History Social History   Tobacco Use  . Smoking status: Current Every Day Smoker    Packs/day: 0.25    Years: 15.00    Pack years: 3.75    Types: Cigarettes  . Smokeless tobacco: Never Used  . Tobacco comment: 4 cigarettes per day  Substance Use Topics  . Alcohol use: Yes    Comment: occassional beer  . Drug use: Yes    Types: Marijuana     Allergies   Visine [tetrahydrozoline hcl]   Review of Systems Review of Systems  Constitutional: Negative for fever.  HENT: Positive for congestion.   Respiratory: Positive for cough and shortness of breath.   Cardiovascular: Positive for leg swelling. Negative for chest pain.  Gastrointestinal: Negative for abdominal pain, nausea and vomiting.  Genitourinary: Negative for dysuria and hematuria.  Neurological: Negative for headaches.  All other systems reviewed and are negative.    Physical Exam Updated Vital Signs BP 131/87 (BP Location: Right Arm)   Pulse 94   Temp 99.3 F (37.4 C) (Oral)   Resp 18   SpO2 96%   Physical Exam Vitals signs and nursing note reviewed.  Constitutional:      Appearance: Normal appearance. She is well-developed.  HENT:     Head: Normocephalic and atraumatic.  Eyes:     General: Lids are normal.     Conjunctiva/sclera: Conjunctivae normal.     Pupils: Pupils are equal, round, and reactive to light.  Neck:     Musculoskeletal: Full passive range of motion without pain.  Cardiovascular:     Rate and Rhythm: Normal rate and  regular rhythm.     Pulses: Normal pulses.          Radial pulses are 2+ on the right side and 2+ on the left side.       Dorsalis pedis pulses are 2+ on the right side and 2+ on the left side.     Heart sounds: Normal heart sounds. No murmur. No friction rub. No gallop.   Pulmonary:     Effort: Pulmonary effort is normal. Tachypnea present.     Breath sounds: Rales present.     Comments: Rales noted to lower lung fields bilaterally. Abdominal:  Palpations: Abdomen is soft. Abdomen is not rigid.     Tenderness: There is no abdominal tenderness. There is no guarding.     Comments: Abdomen is soft, non-distended, non-tender. No rigidity, No guarding. No peritoneal signs.  Musculoskeletal: Normal range of motion.     Comments: Minimal nonpitting edema noted to bilateral lower extremities.  No overlying warmth, erythema.  Bilateral lower extremities are symmetric in appearance.  Skin:    General: Skin is warm and dry.     Capillary Refill: Capillary refill takes less than 2 seconds.  Neurological:     Mental Status: She is alert and oriented to person, place, and time.  Psychiatric:        Speech: Speech normal.      ED Treatments / Results  Labs (all labs ordered are listed, but only abnormal results are displayed) Labs Reviewed  BASIC METABOLIC PANEL - Abnormal; Notable for the following components:      Result Value   Chloride 95 (*)    Glucose, Bld 123 (*)    Creatinine, Ser 2.80 (*)    GFR calc non Af Amer 20 (*)    GFR calc Af Amer 23 (*)    All other components within normal limits  CBC WITH DIFFERENTIAL/PLATELET - Abnormal; Notable for the following components:   WBC 3.9 (*)    RBC 3.74 (*)    Hemoglobin 11.3 (*)    Platelets 51 (*)    All other components within normal limits  TROPONIN I - Abnormal; Notable for the following components:   Troponin I 0.03 (*)    All other components within normal limits  I-STAT BETA HCG BLOOD, ED ([facility], [facility], [facility] ONLY)    EKG EKG  Interpretation  Date/Time:  Saturday July 10 2018 22:18:39 EST Ventricular Rate:  97 PR Interval:    QRS Duration: 77 QT Interval:  374 QTC Calculation: 476 R Axis:   108 Text Interpretation:  Sinus rhythm Borderline prolonged PR interval Left atrial enlargement Low voltage, extremity leads Anteroseptal infarct, age indeterminate When comapred to prior, no significant cahnges seen.  No STEMI Confirmed by Antony Blackbird [phone]) on 07/10/2018 10:53:39 PM   Radiology Dg Chest 2 View  Result Date: 07/10/2018 CLINICAL DATA:  Dyspnea EXAM: CHEST - 2 VIEW COMPARISON:  07/09/2018 FINDINGS: Stable cardiomegaly with interstitial edema and small bilateral pleural effusions. Pulmonary consolidations with air bronchograms are suggested bilaterally. Pneumonia is not excluded. IMPRESSION: Redemonstration of cardiomegaly, small bilateral pleural effusions and CHF. Superimposed pneumonia in the lower lobes is not excluded given air bronchograms and confluent airspace opacities noted. Atelectasis is possibility for this as well. Electronically Signed   By: Ashley Royalty M.D.   On: 07/10/2018 23:21    Procedures Procedures (including critical care time)  Medications Ordered in ED Medications  cefTRIAXone (ROCEPHIN) 1 g in sodium chloride 0.9 % 100 mL IVPB (1 g Intravenous New Bag/Given 07/11/18 0035)  azithromycin (ZITHROMAX) 500 mg in sodium chloride 0.9 % 250 mL IVPB (500 mg Intravenous New Bag/Given 07/11/18 0036)  iopamidol (ISOVUE-370) 76 % injection (has no administration in time range)  iopamidol (ISOVUE-370) 76 % injection 75 mL (75 mLs Intravenous Contrast Given 07/11/18 0057)     Initial Impression / Assessment and Plan / ED Course  I have reviewed the triage vital signs and the nursing notes.  Pertinent labs & imaging results that were available during my care of the patient were reviewed by me and considered in my medical decision making (see  chart for details).     43 year old female past  medical history of ESRD, MI, acute respiratory failure who presents for evaluation of shortness of breath.  Reports she has not recently been going to dialysis and states that is what she thought was contributing to her shortness of breath over last few days.  She was admitted last night and had dialysis done yesterday.  Patient reports she was discharged from the hospital yesterday.  She also reports she had a dialysis session earlier today.  She reports that she had some improvement in shortness of breath after the initial dialysis but states that when she got up this morning, she was having worsening shortness of breath.  She does endorse she has had some cough, congestion.  No fevers noted.  No chest pain.  Patient reports that when she got to dialysis, her oxygen was low and they tried putting her oxygen to see if she would feel better but patient states she is still having shortness of breath.  On initial EMS arrival, patient was 70% on room air.  She was placed on 5 L O2.  Patient states she does not have any oxygen requirement at home.  She denies any chest pain, abdominal pain, nausea/vomiting.  She does report she has had some bilateral lower extremity edema but denies any overlying warmth, erythema.  On initial ED arrival, patient's temperature is 100.2.  She is tachypneic.  O2 sats are 93% on 5L.  Certain for infectious etiology versus fluid overload.  We will plan to check labs.  CBC shows leukopenia of 3.9.  Hemoglobin 11.3.  BMP shows creatinine of 2.80.  Normal potassium.  Troponin 0 0.03.  Likely related to fluid overload status.  Chest x-ray shows cardiomegaly as well as bilateral pleural effusions.  Additionally, there is mention of superimposed pneumonia in the lower lobes.  Been new oxygen requirement, pneumonia on chest x-ray, feel that patient would need admission for further treatment and dialysis.  Discussed patient with Dr. Shanon Brow (hospitalist). She would like me to consult nephrology  for their recommendation before admission.   Discussed patient with Dr. Melvia Heaps (Nephrology. Given only small findings of interstitial edema on XR, recommends CTA for further evaluation of her SOB. If patient has significant fluid overload on CTA, he will plan to consult in the morning and dialyze as needed.  There is any other concerning factors, he can be consulted.  Discussed with Dr. Shanon Brow would like to wait for chest CTA before admission.  Patient signed out to Antonietta Breach, PA-C pending CTA.   Final Clinical Impressions(s) / ED Diagnoses   Final diagnoses:  Pneumonia due to infectious organism, unspecified laterality, unspecified part of lung  Hypoxia    ED Discharge Orders    None       Volanda Napoleon, PA-C 07/11/18 0237    Orpah Greek, MD 07/11/18 [phone]

## 2018-07-10 NOTE — ED Triage Notes (Signed)
Pt comes via [facility] has been having cough for the past week, SOB since Thursday, abd distention, went to dialysis today and had extra fluid pulled off, oxygen was 78% upon EMS arrival. On 5L [place]

## 2018-07-11 ENCOUNTER — Emergency Department (HOSPITAL_COMMUNITY): Payer: Medicare Other

## 2018-07-11 ENCOUNTER — Other Ambulatory Visit: Payer: Self-pay

## 2018-07-11 DIAGNOSIS — I361 Nonrheumatic tricuspid (valve) insufficiency: Secondary | ICD-10-CM | POA: Diagnosis not present

## 2018-07-11 DIAGNOSIS — Z809 Family history of malignant neoplasm, unspecified: Secondary | ICD-10-CM | POA: Diagnosis not present

## 2018-07-11 DIAGNOSIS — J9601 Acute respiratory failure with hypoxia: Secondary | ICD-10-CM | POA: Diagnosis present

## 2018-07-11 DIAGNOSIS — I132 Hypertensive heart and chronic kidney disease with heart failure and with stage 5 chronic kidney disease, or end stage renal disease: Secondary | ICD-10-CM | POA: Diagnosis present

## 2018-07-11 DIAGNOSIS — I370 Nonrheumatic pulmonary valve stenosis: Secondary | ICD-10-CM | POA: Diagnosis not present

## 2018-07-11 DIAGNOSIS — D61818 Other pancytopenia: Secondary | ICD-10-CM | POA: Diagnosis present

## 2018-07-11 DIAGNOSIS — I313 Pericardial effusion (noninflammatory): Secondary | ICD-10-CM | POA: Diagnosis present

## 2018-07-11 DIAGNOSIS — J810 Acute pulmonary edema: Secondary | ICD-10-CM | POA: Diagnosis not present

## 2018-07-11 DIAGNOSIS — N186 End stage renal disease: Secondary | ICD-10-CM | POA: Diagnosis not present

## 2018-07-11 DIAGNOSIS — N2581 Secondary hyperparathyroidism of renal origin: Secondary | ICD-10-CM | POA: Diagnosis present

## 2018-07-11 DIAGNOSIS — I272 Pulmonary hypertension, unspecified: Secondary | ICD-10-CM | POA: Diagnosis present

## 2018-07-11 DIAGNOSIS — I5033 Acute on chronic diastolic (congestive) heart failure: Secondary | ICD-10-CM | POA: Diagnosis present

## 2018-07-11 DIAGNOSIS — I10 Essential (primary) hypertension: Secondary | ICD-10-CM | POA: Diagnosis not present

## 2018-07-11 DIAGNOSIS — Z8249 Family history of ischemic heart disease and other diseases of the circulatory system: Secondary | ICD-10-CM | POA: Diagnosis not present

## 2018-07-11 DIAGNOSIS — R0902 Hypoxemia: Secondary | ICD-10-CM | POA: Diagnosis present

## 2018-07-11 DIAGNOSIS — J9811 Atelectasis: Secondary | ICD-10-CM | POA: Diagnosis present

## 2018-07-11 DIAGNOSIS — F17210 Nicotine dependence, cigarettes, uncomplicated: Secondary | ICD-10-CM | POA: Diagnosis present

## 2018-07-11 DIAGNOSIS — M898X9 Other specified disorders of bone, unspecified site: Secondary | ICD-10-CM | POA: Diagnosis present

## 2018-07-11 DIAGNOSIS — Z992 Dependence on renal dialysis: Secondary | ICD-10-CM | POA: Diagnosis not present

## 2018-07-11 DIAGNOSIS — Z833 Family history of diabetes mellitus: Secondary | ICD-10-CM | POA: Diagnosis not present

## 2018-07-11 DIAGNOSIS — Z888 Allergy status to other drugs, medicaments and biological substances status: Secondary | ICD-10-CM | POA: Diagnosis not present

## 2018-07-11 DIAGNOSIS — D631 Anemia in chronic kidney disease: Secondary | ICD-10-CM | POA: Diagnosis present

## 2018-07-11 LAB — HEPATIC FUNCTION PANEL
ALT: 16 U/L (ref 0–44)
AST: 19 U/L (ref 15–41)
Albumin: 3.3 g/dL — ABNORMAL LOW (ref 3.5–5.0)
Alkaline Phosphatase: 39 U/L (ref 38–126)
Bilirubin, Direct: 0.3 mg/dL — ABNORMAL HIGH (ref 0.0–0.2)
Indirect Bilirubin: 1.7 mg/dL — ABNORMAL HIGH (ref 0.3–0.9)
Total Bilirubin: 2 mg/dL — ABNORMAL HIGH (ref 0.3–1.2)
Total Protein: 6.3 g/dL — ABNORMAL LOW (ref 6.5–8.1)

## 2018-07-11 LAB — MRSA PCR SCREENING: MRSA by PCR: NEGATIVE

## 2018-07-11 LAB — BASIC METABOLIC PANEL
Anion gap: 10 (ref 5–15)
BUN: 16 mg/dL (ref 6–20)
CO2: 30 mmol/L (ref 22–32)
Calcium: 9.2 mg/dL (ref 8.9–10.3)
Chloride: 95 mmol/L — ABNORMAL LOW (ref 98–111)
Creatinine, Ser: 3.38 mg/dL — ABNORMAL HIGH (ref 0.44–1.00)
GFR calc Af Amer: 19 mL/min — ABNORMAL LOW (ref >60)
GFR calc non Af Amer: 16 mL/min — ABNORMAL LOW (ref >60)
GLUCOSE: 88 mg/dL (ref 70–99)
Potassium: 3.6 mmol/L (ref 3.5–5.1)
Sodium: 135 mmol/L (ref 135–145)

## 2018-07-11 LAB — CBC
HCT: 34.3 % — ABNORMAL LOW (ref 36.0–46.0)
Hemoglobin: 11 g/dL — ABNORMAL LOW (ref 12.0–15.0)
MCH: 30.9 pg (ref 26.0–34.0)
MCHC: 32.1 g/dL (ref 30.0–36.0)
MCV: 96.3 fL (ref 80.0–100.0)
Platelets: 50 10*3/uL — ABNORMAL LOW (ref 150–400)
RBC: 3.56 MIL/uL — ABNORMAL LOW (ref 3.87–5.11)
RDW: 14.7 % (ref 11.5–15.5)
WBC: 2.9 10*3/uL — ABNORMAL LOW (ref 4.0–10.5)
nRBC: 0 % (ref 0.0–0.2)

## 2018-07-11 MED ORDER — HYDRALAZINE HCL 50 MG PO TABS
100.0000 mg | ORAL_TABLET | Freq: Three times a day (TID) | ORAL | Status: DC
  Administered 2018-07-11: 100 mg via ORAL
  Filled 2018-07-11: qty 2

## 2018-07-11 MED ORDER — SPIRONOLACTONE 25 MG PO TABS
50.0000 mg | ORAL_TABLET | Freq: Every day | ORAL | Status: DC
  Administered 2018-07-11 – 2018-07-13 (×3): 50 mg via ORAL
  Filled 2018-07-11: qty 2
  Filled 2018-07-11: qty 1
  Filled 2018-07-11: qty 2
  Filled 2018-07-11: qty 1
  Filled 2018-07-11: qty 2
  Filled 2018-07-11: qty 1

## 2018-07-11 MED ORDER — SODIUM CHLORIDE 0.9 % IV SOLN: 250.0000 mL | INTRAVENOUS | Status: DC | PRN

## 2018-07-11 MED ORDER — SODIUM CHLORIDE 0.9% FLUSH
3.0000 mL | Freq: Two times a day (BID) | INTRAVENOUS | Status: DC
  Administered 2018-07-11 – 2018-07-13 (×6): 3 mL via INTRAVENOUS

## 2018-07-11 MED ORDER — IOPAMIDOL (ISOVUE-370) INJECTION 76%
75.0000 mL | Freq: Once | INTRAVENOUS | Status: AC | PRN
  Administered 2018-07-11: 75 mL via INTRAVENOUS

## 2018-07-11 MED ORDER — SODIUM CHLORIDE 0.9% FLUSH: 3.0000 mL | INTRAVENOUS | Status: DC | PRN

## 2018-07-11 MED ORDER — IOPAMIDOL (ISOVUE-370) INJECTION 76%
INTRAVENOUS | Status: AC
  Filled 2018-07-11: qty 100

## 2018-07-11 MED ORDER — MINOXIDIL 2.5 MG PO TABS
5.0000 mg | ORAL_TABLET | Freq: Two times a day (BID) | ORAL | Status: DC
  Administered 2018-07-11 (×2): 5 mg via ORAL
  Filled 2018-07-11 (×4): qty 2

## 2018-07-11 MED ORDER — HYDRALAZINE HCL 25 MG PO TABS
25.0000 mg | ORAL_TABLET | Freq: Three times a day (TID) | ORAL | Status: DC
  Administered 2018-07-11 – 2018-07-12 (×4): 25 mg via ORAL
  Filled 2018-07-11 (×4): qty 1

## 2018-07-11 MED ORDER — AMLODIPINE BESYLATE 10 MG PO TABS
10.0000 mg | ORAL_TABLET | Freq: Every day | ORAL | Status: DC
  Administered 2018-07-11 – 2018-07-12 (×3): 10 mg via ORAL
  Filled 2018-07-11 (×3): qty 1

## 2018-07-11 MED ORDER — CARVEDILOL 25 MG PO TABS
25.0000 mg | ORAL_TABLET | Freq: Two times a day (BID) | ORAL | Status: DC
  Administered 2018-07-11 – 2018-07-13 (×4): 25 mg via ORAL
  Filled 2018-07-11 (×4): qty 1

## 2018-07-11 NOTE — Progress Notes (Signed)
New Admission Note:  Arrival Method: By wheelchair from ED around [place] Orientation: Alert and oriented Telemetry: None Assessment: Completed Skin: Completed, refer to flowsheets IV: Right AC Pain: Denies Tubes: None Safety Measures: Safety Fall Prevention Plan was given, discussed  Admission: Completed [facility] Orientation: Patient has been orientated to the room, unit and the staff. Family: None  Orders have been reviewed and implemented. Will continue to monitor the patient. Call light has been placed within reach.  Perry Mount, RN  Phone Number: [phone]

## 2018-07-11 NOTE — Consult Note (Addendum)
[facility] Renal Consultation Note  Indication for Consultation:  Management of ESRD/hemodialysis; anemia, hypertension/volume and secondary hyperparathyroidism  HPI: Madeline Brown is a 43 y.o. 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  Blood pressures were not significantly elevated. Chest x-ray = pulmonary edema.  potassium of 3.3, BUN 16.Noted was had just had HD Tues 1/23  Was dc with edw lowered 55kg (decr 4 kg from prior edw) . Now presents again with SOB /72 % O2 Sat , CT with Patchy ground-glass opacities bilaterally cw  CHF associated with small right and trace left pleural effusions.  Neg for PE .NOTED she attened op HD yesterday with bp 164/107  Wt pre hd 55.2 post = 54.2  bp 137/72 post  and latter in evening became sob and presented to ER . She  Denies  any fevers, chills, abd pain n, N/V/D ,thinks she is losing weight "not sue why".Appetie ok   ------------------------------------------------------------------- Last ech 01/2017 =LV EF: 50% -   55%    Past Medical History:  Diagnosis Date  . Chronic kidney disease   . Hypertension     Past Surgical History:  Procedure Laterality Date  . AV FISTULA PLACEMENT Left 06/24/2017   Procedure: CREATION OF LEFT ARM BRACHIALCEPHALIC  ARTERIOVENOUS (AV) FISTULA;  Surgeon: Rosetta Posner, MD;  Location: [facility];  Service: Vascular;  Laterality: Left;  . CESAREAN SECTION     x2  . FISTULA SUPERFICIALIZATION Left 10/28/2017   Procedure: SUPERFICIALIZATION LEFT BRACHIOCEPHALIC ARTERIOVENOUS FISTULA;  Surgeon: Rosetta Posner, MD;  Location: [facility];  Service: Vascular;  Laterality: Left;  . INSERTION OF DIALYSIS CATHETER Right 06/24/2017   Procedure: INSERTION OF TUNNELED DIALYSIS CATHETER;  Surgeon: Rosetta Posner, MD;  Location: [facility];  Service: Vascular;  Laterality: Right;  . LIGATION OF COMPETING BRANCHES OF  ARTERIOVENOUS FISTULA  10/28/2017   Procedure: LIGATION OF COMPETING BRANCHES OF ARTERIOVENOUS FISTULA x3;  Surgeon: Rosetta Posner, MD;  [facility];  Service: Vascular;;      Family History  Problem Relation Age of Onset  . Diabetes Father   . Hypertension Father   . Cancer Father       reports that she has been smoking cigarettes. She has a 3.75 pack-year smoking history. She has never used smokeless tobacco. She reports current alcohol use. She reports current drug use. Drug: Marijuana.   Allergies  Allergen Reactions  . Visine [Tetrahydrozoline Hcl] Swelling    Eyes Swelling.    Prior to Admission medications   Medication Sig Start Date End Date Taking? Authorizing Provider  amLODipine (NORVASC) 10 MG tablet Take 10 mg by mouth at bedtime.   Yes [provider]  carvedilol (COREG) 25 MG tablet Take 1 tablet (25 mg total) by mouth 2 (two) times daily with a meal. 06/27/17 10/23/19 Yes Adhikari, Tamsen Meek, MD  hydrALAZINE (APRESOLINE) 100 MG tablet Take 1 tablet (100 mg total) by mouth every 8 (eight) hours. 06/27/17 10/23/18 Yes Adhikari, Tamsen Meek, MD  minoxidil (LONITEN) 2.5 MG tablet Take 5 mg by mouth 2 (two) times daily. 01/22/18  Yes [provider]  RENAGEL 800 MG tablet Take 1,600 mg by mouth 3 (three) times daily with meals. 08/26/17  Yes [provider]  spironolactone (ALDACTONE) 50 MG tablet Take 50 mg by mouth daily.   Yes [provider]  Anti-infectives (From admission, onward)   Start     Dose/Rate Route Frequency Ordered Stop   07/11/18 0000  cefTRIAXone (ROCEPHIN) 1 g in sodium chloride 0.9 % 100 mL IVPB     1 g 200 mL/hr over 30 Minutes Intravenous  Once 07/10/18 2356 07/11/18 0128   07/11/18 0000  azithromycin (ZITHROMAX) 500 mg in sodium chloride 0.9 % 250 mL IVPB     500 mg 250 mL/hr over 60 Minutes Intravenous  Once 07/10/18 2356 07/11/18 0128      Results for orders placed or performed during the hospital encounter of  07/10/18 (from the past 48 hour(s))  Basic metabolic panel     Status: Abnormal   Collection Time: 07/10/18 10:17 PM  Result Value Ref Range   Sodium 135 135 - 145 mmol/L   Potassium 3.5 3.5 - 5.1 mmol/L   Chloride 95 (L) 98 - 111 mmol/L   CO2 32 22 - 32 mmol/L   Glucose, Bld 123 (H) 70 - 99 mg/dL   BUN 13 6 - 20 mg/dL   Creatinine, Ser 2.80 (H) 0.44 - 1.00 mg/dL    Comment: DELTA CHECK NOTED   Calcium 9.0 8.9 - 10.3 mg/dL   GFR calc non Af Amer 20 (L) >60 mL/min   GFR calc Af Amer 23 (L) >60 mL/min   Anion gap 8 5 - 15    Comment: Performed at [facility], [address] [place], [place] [address]  CBC with Differential     Status: Abnormal   Collection Time: 07/10/18 10:17 PM  Result Value Ref Range   WBC 3.9 (L) 4.0 - 10.5 K/uL   RBC 3.74 (L) 3.87 - 5.11 MIL/uL   Hemoglobin 11.3 (L) 12.0 - 15.0 g/dL   HCT 36.4 36.0 - 46.0 %   MCV 97.3 80.0 - 100.0 fL   MCH 30.2 26.0 - 34.0 pg   MCHC 31.0 30.0 - 36.0 g/dL   RDW 14.7 11.5 - 15.5 %   Platelets 51 (L) 150 - 400 K/uL    Comment: REPEATED TO VERIFY Immature Platelet Fraction may be clinically indicated, consider ordering this additional test [id number] CONSISTENT WITH PREVIOUS RESULT    nRBC 0.0 0.0 - 0.2 %   Neutrophils Relative % 67 %   Neutro Abs 2.6 1.7 - 7.7 K/uL   Lymphocytes Relative 22 %   Lymphs Abs 0.8 0.7 - 4.0 K/uL   Monocytes Relative 8 %   Monocytes Absolute 0.3 0.1 - 1.0 K/uL   Eosinophils Relative 2 %   Eosinophils Absolute 0.1 0.0 - 0.5 K/uL   Basophils Relative 1 %   Basophils Absolute 0.0 0.0 - 0.1 K/uL   Immature Granulocytes 0 %   Abs Immature Granulocytes 0.01 0.00 - 0.07 K/uL    Comment: Performed at [facility], [place] [address], [place], [place] [address]  Troponin I - Once     Status: Abnormal   Collection Time: 07/10/18 10:17 PM  Result Value Ref Range   Troponin I 0.03 (HH) <0.03 ng/mL    Comment: CRITICAL VALUE NOTED.  VALUE IS CONSISTENT WITH PREVIOUSLY REPORTED AND CALLED  VALUE. Performed at [facility], [place] [address] [place], [place] [address]   MRSA PCR Screening     Status: None   Collection Time: 07/11/18  3:15 AM  Result Value Ref Range   MRSA by PCR NEGATIVE NEGATIVE    Comment:        The GeneXpert MRSA  Assay (FDA approved for NASAL specimens only), is one component of a comprehensive MRSA colonization surveillance program. It is not intended to diagnose MRSA infection nor to guide or monitor treatment for MRSA infections. Performed at [facility], [place] [address], [place], [place] [address]   Basic metabolic panel     Status: Abnormal   Collection Time: 07/11/18  6:39 AM  Result Value Ref Range   Sodium 135 135 - 145 mmol/L   Potassium 3.6 3.5 - 5.1 mmol/L   Chloride 95 (L) 98 - 111 mmol/L   CO2 30 22 - 32 mmol/L   Glucose, Bld 88 70 - 99 mg/dL   BUN 16 6 - 20 mg/dL   Creatinine, Ser 3.38 (H) 0.44 - 1.00 mg/dL   Calcium 9.2 8.9 - 10.3 mg/dL   GFR calc non Af Amer 16 (L) >60 mL/min   GFR calc Af Amer 19 (L) >60 mL/min   Anion gap 10 5 - 15    Comment: Performed at [facility], [place] [address], [place], [place] [address]  CBC     Status: Abnormal   Collection Time: 07/11/18  6:39 AM  Result Value Ref Range   WBC 2.9 (L) 4.0 - 10.5 K/uL   RBC 3.56 (L) 3.87 - 5.11 MIL/uL   Hemoglobin 11.0 (L) 12.0 - 15.0 g/dL   HCT 34.3 (L) 36.0 - 46.0 %   MCV 96.3 80.0 - 100.0 fL   MCH 30.9 26.0 - 34.0 pg   MCHC 32.1 30.0 - 36.0 g/dL   RDW 14.7 11.5 - 15.5 %   Platelets 50 (L) 150 - 400 K/uL    Comment: REPEATED TO VERIFY Immature Platelet Fraction may be clinically indicated, consider ordering this additional test [id number] CONSISTENT WITH PREVIOUS RESULT    nRBC 0.0 0.0 - 0.2 %    Comment: Performed at [facility], [place] [address], [place], [place] [address]     ROS: see hpi   Physical Exam: Vitals:   07/11/18 0610 07/11/18 0905  BP: 130/84 136/84  Pulse: 94 (!) 108  Resp:  18  Temp:  98.8 F (37.1 C)   SpO2:  (!) 85%     General:thin alert AAF , WD, WN, NAD  With 4l [place] O2, NAD HEENT: [place], MMM, Not ictieriuc Neck: supple no masses, Pos jvd Heart: tachy regular with ?gallop , Large murmur LU SB /vs Bruit transferring from LU arm avf /no rub Lungs: Faint rales left lower ,I=unlabored breahtoing on NCX O2 Abdomen: BS pos , soft, Nt, ND, No ascites or masses appreciated  Extremities: No pedal edeam  Skin: No overt rash or pedal ulcers  Neuro: Alert , OX3, moves all extremities, Stands on bedside scales with out  difficulties  Dialysis Access: LUA  AVF  With LARGE ANEURYSMAL  Development with out thinn ing   Dialysis Orders: Center: [facility]   on TTS . EDW 55 (JUST Decreased 01/25)  HD Bath 2k, 2ca  Time  4  Heparin NONE ( recently changed with LOW PLTS HIT Pend). Access LUA AVF     Calcitriol 0.37mcg po HD  Mircera  150 mcg  q 2 wks ( last given 07/01/18)   Venofer 50 mg q 2wks   Assessment/Plan 1. Recurrent Acute hypoxic Respiratory  Failure- HD today needs further lowering edw ,hd mon  time 3 hr , wu needed to eval Cardiac causes ,Echo, Huge Lua AVF may be playing a role This patient has been  on HD 9 mos, she is losing wt and presenting again w/ vol overload.  Vol overload is one issue which will correct w/ serial HD.  Wt loss is worrisome, she has markedly enlarged cardiac shadow double the size it was pre HD, a huge AVF w a bruit that's heard on the other side of the body, and possibly a gallop. She may have high output CHF. This is not common but first step would be check ECHO and then get cardiology input as needed.   2. ESRD -  NL schedule TTS , needs serial vol uf  3. Hypertension/volume  - Has 5 bp meds listed =On Amlodipine ,Minoxildil , Coreg  , Hydralazine , Spironolactone  Taper down as bp allow with uf  4. Recent Thrombocytopenia - Plt 51 ,have stopped  Heparin with HD  ,HIT panel today pre hd ordered  5. Anemia of ESRD  - HGB 11 no esa needed , weekly venofer  6. Metabolic bone  disease -  PO vit d with hd and binder 7. Nutrition - Alb 3.3 renal diet with renal vitamin   Ernest Haber, PA-C [place] [phone] 07/11/2018, 10:53 AM   Pt seen, examined and agree w A/P as above.  Kelly Splinter MD [employer] pager [phone]   07/11/2018, 1:06 PM

## 2018-07-11 NOTE — H&P (Signed)
History and Physical    Madeline Brown TTS:[id number] DOB: May 04, 1976 DOA: 07/10/2018  PCP: Patient, No Pcp Per  Patient coming from: Home  Chief Complaint: Shortness of breath  HPI: Madeline Brown is a 43 y.o. female with medical history significant of end-stage renal disease, hypertension comes in with over day of shortness of breath.  Patient is been needing extra sessions of dialysis the last week.  She denies any fevers or any cough.  She denies any swelling.  Patient hypoxic requiring 5 L of oxygen to keep her O2 sats at 94%.  Initially was 72% on room air.  She is currently feeling better on 5 L.  Patient be referred for admission for acute hypoxic respiratory failure needing dialysis.  Her next session is not until Tuesday.  She denies any pain.  Review of Systems: As per HPI otherwise 10 point review of systems negative.   Past Medical History:  Diagnosis Date  . Chronic kidney disease   . Hypertension     Past Surgical History:  Procedure Laterality Date  . AV FISTULA PLACEMENT Left 06/24/2017   Procedure: CREATION OF LEFT ARM BRACHIALCEPHALIC  ARTERIOVENOUS (AV) FISTULA;  Surgeon: Rosetta Posner, MD;  Location: [place];  Service: Vascular;  Laterality: Left;  . CESAREAN SECTION     x2  . FISTULA SUPERFICIALIZATION Left 10/28/2017   Procedure: SUPERFICIALIZATION LEFT BRACHIOCEPHALIC ARTERIOVENOUS FISTULA;  Surgeon: Rosetta Posner, MD;  Location: [place];  Service: Vascular;  Laterality: Left;  . INSERTION OF DIALYSIS CATHETER Right 06/24/2017   Procedure: INSERTION OF TUNNELED DIALYSIS CATHETER;  Surgeon: Rosetta Posner, MD;  Location: [place];  Service: Vascular;  Laterality: Right;  . LIGATION OF COMPETING BRANCHES OF ARTERIOVENOUS FISTULA  10/28/2017   Procedure: LIGATION OF COMPETING BRANCHES OF ARTERIOVENOUS FISTULA x3;  Surgeon: Rosetta Posner, MD;  Location: [place];  Service: Vascular;;     reports that she has been smoking cigarettes. She has a 3.75 pack-year smoking history. She  has never used smokeless tobacco. She reports current alcohol use. She reports current drug use. Drug: Marijuana.  Allergies  Allergen Reactions  . Visine [Tetrahydrozoline Hcl] Swelling    Eyes Swelling.    Family History  Problem Relation Age of Onset  . Diabetes Father   . Hypertension Father   . Cancer Father     Prior to Admission medications   Medication Sig Start Date End Date Taking? Authorizing Provider  amLODipine (NORVASC) 10 MG tablet Take 10 mg by mouth at bedtime.   Yes [provider]  carvedilol (COREG) 25 MG tablet Take 1 tablet (25 mg total) by mouth 2 (two) times daily with a meal. 06/27/17 10/23/19 Yes Adhikari, Tamsen Meek, MD  hydrALAZINE (APRESOLINE) 100 MG tablet Take 1 tablet (100 mg total) by mouth every 8 (eight) hours. 06/27/17 10/23/18 Yes Adhikari, Tamsen Meek, MD  minoxidil (LONITEN) 2.5 MG tablet Take 5 mg by mouth 2 (two) times daily. 01/22/18  Yes [provider]  RENAGEL 800 MG tablet Take 1,600 mg by mouth 3 (three) times daily with meals. 08/26/17  Yes [provider]  spironolactone (ALDACTONE) 50 MG tablet Take 50 mg by mouth daily.   Yes [provider]    Physical Exam: Vitals:   07/10/18 2205 07/10/18 2206 07/10/18 2207 07/11/18 0036  BP:   128/83 131/87  Pulse:   100 94  Resp:   (!) 22 18  Temp:  100.2 F (37.9 C)  99.3 F (37.4 C)  TempSrc:  Oral  Oral  SpO2: 93%  93% 96%      Constitutional: NAD, calm, comfortable Vitals:   07/10/18 2205 07/10/18 2206 07/10/18 2207 07/11/18 0036  BP:   128/83 131/87  Pulse:   100 94  Resp:   (!) 22 18  Temp:  100.2 F (37.9 C)  99.3 F (37.4 C)  TempSrc:  Oral  Oral  SpO2: 93%  93% 96%   Eyes: PERRL, lids and conjunctivae normal ENMT: Mucous membranes are moist. Posterior pharynx clear of any exudate or lesions.Normal dentition.  Neck: normal, supple, no masses, no thyromegaly Respiratory: clear to auscultation bilaterally, no wheezing, no crackles. Normal respiratory  effort. No accessory muscle use.  Cardiovascular: Regular rate and rhythm, no murmurs / rubs / gallops. No extremity edema. 2+ pedal pulses. No carotid bruits.  Abdomen: no tenderness, no masses palpated. No hepatosplenomegaly. Bowel sounds positive.  Musculoskeletal: no clubbing / cyanosis. No joint deformity upper and lower extremities. Good ROM, no contractures. Normal muscle tone.  Skin: no rashes, lesions, ulcers. No induration Neurologic: CN 2-12 grossly intact. Sensation intact, DTR normal. Strength 5/5 in all 4.  Psychiatric: Normal judgment and insight. Alert and oriented x 3. Normal mood.    Labs on Admission: I have personally reviewed following labs and imaging studies  CBC: Recent Labs  Lab 07/09/18 0053 07/09/18 0529 07/10/18 2217  WBC 4.1 3.4* 3.9*  NEUTROABS 3.1  --  2.6  HGB 10.6* 10.8* 11.3*  HCT 34.0* 34.4* 36.4  MCV 98.0 98.9 97.3  PLT 79* 79* 51*   Basic Metabolic Panel: Recent Labs  Lab 07/09/18 0053 07/09/18 0529 07/10/18 2217  NA 139  --  135  K 3.3*  --  3.5  CL 98  --  95*  CO2 32  --  32  GLUCOSE 115*  --  123*  BUN 16  --  13  CREATININE 3.38* 3.87* 2.80*  CALCIUM 8.7*  --  9.0   GFR: Estimated Creatinine Clearance: 20.6 mL/min (A) (by C-G formula based on SCr of 2.8 mg/dL (H)). Liver Function Tests: No results for input(s): AST, ALT, ALKPHOS, BILITOT, PROT, ALBUMIN in the last 168 hours. No results for input(s): LIPASE, AMYLASE in the last 168 hours. No results for input(s): AMMONIA in the last 168 hours. Coagulation Profile: No results for input(s): INR, PROTIME in the last 168 hours. Cardiac Enzymes: Recent Labs  Lab 07/09/18 0529 07/10/18 2217  TROPONINI 0.03* 0.03*   BNP (last 3 results) No results for input(s): PROBNP in the last 8760 hours. HbA1C: No results for input(s): HGBA1C in the last 72 hours. CBG: No results for input(s): GLUCAP in the last 168 hours. Lipid Profile: No results for input(s): CHOL, HDL, LDLCALC,  TRIG, CHOLHDL, LDLDIRECT in the last 72 hours. Thyroid Function Tests: No results for input(s): TSH, T4TOTAL, FREET4, T3FREE, THYROIDAB in the last 72 hours. Anemia Panel: No results for input(s): VITAMINB12, FOLATE, FERRITIN, TIBC, IRON, RETICCTPCT in the last 72 hours. Urine analysis:    Component Value Date/Time   COLORURINE YELLOW 03/09/2018 [place] 03/09/2018 2314   LABSPEC 1.014 03/09/2018 2314   PHURINE 5.0 03/09/2018 2314   GLUCOSEU NEGATIVE 03/09/2018 2314   HGBUR NEGATIVE 03/09/2018 2314   BILIRUBINUR NEGATIVE 03/09/2018 2314   KETONESUR NEGATIVE 03/09/2018 2314   PROTEINUR 100 (A) 03/09/2018 2314   UROBILINOGEN 0.2 06/17/2016 1257   NITRITE NEGATIVE 03/09/2018 2314   LEUKOCYTESUR NEGATIVE 03/09/2018 2314   Sepsis Labs: !!!!!!!!!!!!!!!!!!!!!!!!!!!!!!!!!!!!!!!!!!!! @LABRCNTIP (procalcitonin:4,lacticidven:4) )No results found  for this or any previous visit (from the past 240 hour(s)).   Radiological Exams on Admission: Dg Chest 2 View  Result Date: 07/10/2018 CLINICAL DATA:  Dyspnea EXAM: CHEST - 2 VIEW COMPARISON:  07/09/2018 FINDINGS: Stable cardiomegaly with interstitial edema and small bilateral pleural effusions. Pulmonary consolidations with air bronchograms are suggested bilaterally. Pneumonia is not excluded. IMPRESSION: Redemonstration of cardiomegaly, small bilateral pleural effusions and CHF. Superimposed pneumonia in the lower lobes is not excluded given air bronchograms and confluent airspace opacities noted. Atelectasis is possibility for this as well. Electronically Signed   By: Ashley Royalty M.D.   On: 07/10/2018 23:21   Ct Angio Chest Pe W And/or Wo Contrast  Result Date: 07/11/2018 CLINICAL DATA:  Cough for the past week EXAM: CT ANGIOGRAPHY CHEST WITH CONTRAST TECHNIQUE: Multidetector CT imaging of the chest was performed using the standard protocol during bolus administration of intravenous contrast. Multiplanar CT image reconstructions and  MIPs were obtained to evaluate the vascular anatomy. CONTRAST:  75 cc ISOVUE-370 IOPAMIDOL (ISOVUE-370) INJECTION 76% COMPARISON:  Same day CXR FINDINGS: Cardiovascular: Cardiomegaly with moderate pericardial effusion between 10 mm posteriorly, 20 mm right lateral and 15 mm ventrally. No acute pulmonary embolus. Nonaneurysmal thoracic aorta without dissection. Mediastinum/Nodes: No enlarged mediastinal, hilar, or axillary lymph nodes. Thyroid gland, trachea, and esophagus demonstrate no significant findings. Lungs/Pleura: Patchy ground-glass opacities bilaterally consistent with CHF associated with small right and trace left pleural effusions. Associated lower lobe atelectasis. Consolidations in the lingula and left lower lobe compatible with atelectasis and/or pneumonia. Upper Abdomen: Nonacute Musculoskeletal: No chest wall abnormality. No acute or significant osseous findings. Review of the MIP images confirms the above findings. IMPRESSION: 1. No acute pulmonary embolus. 2. Small right and trace left pleural effusions with bibasilar atelectasis. Lingular and left lower lobe consolidations compatible with atelectasis and/or pneumonia. 3. Cardiomegaly with pericardial effusion measuring up to 2 cm in thickness. Ground-glass opacities throughout both lungs compatible with CHF. Electronically Signed   By: Ashley Royalty M.D.   On: 07/11/2018 01:28   Old chart reviewed Chest x-ray shows edema versus infiltrates Case discussed with EDP PA  Assessment/Plan 43 year old female with acute respiratory hypoxic failure secondary to dialysis needs Principal Problem:   Acute respiratory failure with hypoxia (HCC)-suspect this is due to pulmonary edema.  Nephrology called will see in the morning.  Patient is comfortable on 5 L of oxygen at this time.  In the absence of fever or leukocytosis we will hold off on any further antibiotics, patient did get Rocephin and azithromycin in the ED.  If the need arises that she does  need antibiotics would provide broader antibiotic coverage.  Active Problems:   ESRD (end stage renal disease) (HCC)-likely dialysis in the morning.    Acute pulmonary edema (HCC)-nephrology called for evaluation for extra sessions of dialysis    Hypertension-resume home meds    DVT prophylaxis: SCDs Code Status: Full Family Communication: None Disposition Plan: Several days Consults called: Nephrology Dr. Burnett Sheng Admission status: Admission   Varnika Butz A MD [facility]  If 7PM-7AM, please contact night-coverage [email or web address] Password [email or web address]  07/11/2018, 1:49 AM

## 2018-07-11 NOTE — ED Provider Notes (Signed)
1:35 AM Dr. Shanon Brow of [facility] notified of CT results.  She will evaluate the patient for admission.   Results for orders placed or performed during the hospital encounter of 78/24/23  Basic metabolic panel  Result Value Ref Range   Sodium 135 135 - 145 mmol/L   Potassium 3.5 3.5 - 5.1 mmol/L   Chloride 95 (L) 98 - 111 mmol/L   CO2 32 22 - 32 mmol/L   Glucose, Bld 123 (H) 70 - 99 mg/dL   BUN 13 6 - 20 mg/dL   Creatinine, Ser 2.80 (H) 0.44 - 1.00 mg/dL   Calcium 9.0 8.9 - 10.3 mg/dL   GFR calc non Af Amer 20 (L) >60 mL/min   GFR calc Af Amer 23 (L) >60 mL/min   Anion gap 8 5 - 15  CBC with Differential  Result Value Ref Range   WBC 3.9 (L) 4.0 - 10.5 K/uL   RBC 3.74 (L) 3.87 - 5.11 MIL/uL   Hemoglobin 11.3 (L) 12.0 - 15.0 g/dL   HCT 36.4 36.0 - 46.0 %   MCV 97.3 80.0 - 100.0 fL   MCH 30.2 26.0 - 34.0 pg   MCHC 31.0 30.0 - 36.0 g/dL   RDW 14.7 11.5 - 15.5 %   Platelets 51 (L) 150 - 400 K/uL   nRBC 0.0 0.0 - 0.2 %   Neutrophils Relative % 67 %   Neutro Abs 2.6 1.7 - 7.7 K/uL   Lymphocytes Relative 22 %   Lymphs Abs 0.8 0.7 - 4.0 K/uL   Monocytes Relative 8 %   Monocytes Absolute 0.3 0.1 - 1.0 K/uL   Eosinophils Relative 2 %   Eosinophils Absolute 0.1 0.0 - 0.5 K/uL   Basophils Relative 1 %   Basophils Absolute 0.0 0.0 - 0.1 K/uL   Immature Granulocytes 0 %   Abs Immature Granulocytes 0.01 0.00 - 0.07 K/uL  Troponin I - Once  Result Value Ref Range   Troponin I 0.03 (HH) <0.03 ng/mL   Dg Chest 2 View  Result Date: 07/10/2018 CLINICAL DATA:  Dyspnea EXAM: CHEST - 2 VIEW COMPARISON:  07/09/2018 FINDINGS: Stable cardiomegaly with interstitial edema and small bilateral pleural effusions. Pulmonary consolidations with air bronchograms are suggested bilaterally. Pneumonia is not excluded. IMPRESSION: Redemonstration of cardiomegaly, small bilateral pleural effusions and CHF. Superimposed pneumonia in the lower lobes is not excluded given air bronchograms and confluent airspace  opacities noted. Atelectasis is possibility for this as well. Electronically Signed   By: Ashley Royalty M.D.   On: 07/10/2018 23:21   Ct Angio Chest Pe W And/or Wo Contrast  Result Date: 07/11/2018 CLINICAL DATA:  Cough for the past week EXAM: CT ANGIOGRAPHY CHEST WITH CONTRAST TECHNIQUE: Multidetector CT imaging of the chest was performed using the standard protocol during bolus administration of intravenous contrast. Multiplanar CT image reconstructions and MIPs were obtained to evaluate the vascular anatomy. CONTRAST:  75 cc ISOVUE-370 IOPAMIDOL (ISOVUE-370) INJECTION 76% COMPARISON:  Same day CXR FINDINGS: Cardiovascular: Cardiomegaly with moderate pericardial effusion between 10 mm posteriorly, 20 mm right lateral and 15 mm ventrally. No acute pulmonary embolus. Nonaneurysmal thoracic aorta without dissection. Mediastinum/Nodes: No enlarged mediastinal, hilar, or axillary lymph nodes. Thyroid gland, trachea, and esophagus demonstrate no significant findings. Lungs/Pleura: Patchy ground-glass opacities bilaterally consistent with CHF associated with small right and trace left pleural effusions. Associated lower lobe atelectasis. Consolidations in the lingula and left lower lobe compatible with atelectasis and/or pneumonia. Upper Abdomen: Nonacute Musculoskeletal: No chest wall abnormality. No acute  or significant osseous findings. Review of the MIP images confirms the above findings. IMPRESSION: 1. No acute pulmonary embolus. 2. Small right and trace left pleural effusions with bibasilar atelectasis. Lingular and left lower lobe consolidations compatible with atelectasis and/or pneumonia. 3. Cardiomegaly with pericardial effusion measuring up to 2 cm in thickness. Ground-glass opacities throughout both lungs compatible with CHF. Electronically Signed   By: Ashley Royalty M.D.   On: 07/11/2018 01:28   Dg Chest Port 1 View  Result Date: 07/09/2018 CLINICAL DATA:  Shortness of breath and cough for 1 day. History  of end-stage renal disease on dialysis. EXAM: PORTABLE CHEST 1 VIEW COMPARISON:  Chest radiograph March 10, 2018 FINDINGS: Moderate to severe cardiomegaly, increased from prior examination. Interstitial prominence with patchy bibasilar airspace opacities. Small bilateral pleural effusions. No pneumothorax. Soft tissue planes and included osseous structures are non suspicious. IMPRESSION: 1. Worsening cardiomegaly with interstitial prominence suspicious for pulmonary edema. 2. Bibasilar confluent edema versus pneumonia with small pleural effusions. Electronically Signed   By: Elon Alas M.D.   On: 07/09/2018 00:43      Antonietta Breach, PA-C 07/11/18 1655    Orpah Greek, MD 07/11/18 [phone]

## 2018-07-11 NOTE — Procedures (Signed)
   I was present at this dialysis session, have reviewed the session itself and made  appropriate changes Kelly Splinter MD [place] pager [phone]   07/11/2018, 1:13 PM

## 2018-07-11 NOTE — Progress Notes (Signed)
Patient was admitted earlier today.  Available documentation and surgery.  Patient presented with shortness of breath.  Patient is currently on supplemental oxygen 3.5 L via nasal cannula each minute.  Patient has required frequent dialysis recently.  THS is negative for pulmonary embolism, however, pericardial effusion was reported.  We will proceed with complete echocardiogram to further assess the pericardial effusion.  Patient also has pleural effusion with compression atelectasis.  Constitutional symptoms of pneumonia.  Tachycardia persists.  Will check LFTs as well, to assess patient's albumin.  Further management will depend on hospital course.  Nephrology input is appreciated.  As per H&P done earlier today "Madeline Brown is a 43 y.o. female with medical history significant of end-stage renal disease, hypertension comes in with over day of shortness of breath.  Patient is been needing extra sessions of dialysis the last week.  She denies any fevers or any cough.  She denies any swelling.  Patient hypoxic requiring 5 L of oxygen to keep her O2 sats at 94%.  Initially was 72% on room air.  She is currently feeling better on 5 L.  Patient be referred for admission for acute hypoxic respiratory failure needing dialysis.  Her next session is not until Tuesday.  She denies any pain".

## 2018-07-12 ENCOUNTER — Encounter (HOSPITAL_COMMUNITY): Payer: Self-pay | Admitting: Cardiology

## 2018-07-12 ENCOUNTER — Inpatient Hospital Stay (HOSPITAL_COMMUNITY): Payer: Medicare Other

## 2018-07-12 ENCOUNTER — Other Ambulatory Visit (HOSPITAL_COMMUNITY): Payer: Medicare Other

## 2018-07-12 DIAGNOSIS — J9601 Acute respiratory failure with hypoxia: Secondary | ICD-10-CM

## 2018-07-12 DIAGNOSIS — I370 Nonrheumatic pulmonary valve stenosis: Secondary | ICD-10-CM

## 2018-07-12 DIAGNOSIS — I361 Nonrheumatic tricuspid (valve) insufficiency: Secondary | ICD-10-CM

## 2018-07-12 DIAGNOSIS — I272 Pulmonary hypertension, unspecified: Secondary | ICD-10-CM

## 2018-07-12 DIAGNOSIS — J810 Acute pulmonary edema: Secondary | ICD-10-CM

## 2018-07-12 DIAGNOSIS — J9811 Atelectasis: Secondary | ICD-10-CM

## 2018-07-12 DIAGNOSIS — N186 End stage renal disease: Secondary | ICD-10-CM

## 2018-07-12 DIAGNOSIS — I10 Essential (primary) hypertension: Secondary | ICD-10-CM

## 2018-07-12 DIAGNOSIS — I313 Pericardial effusion (noninflammatory): Secondary | ICD-10-CM

## 2018-07-12 DIAGNOSIS — I3139 Other pericardial effusion (noninflammatory): Secondary | ICD-10-CM

## 2018-07-12 LAB — CBC WITH DIFFERENTIAL/PLATELET
Abs Immature Granulocytes: 0.01 10*3/uL (ref 0.00–0.07)
Basophils Absolute: 0 10*3/uL (ref 0.0–0.1)
Basophils Relative: 1 %
Eosinophils Absolute: 0.1 10*3/uL (ref 0.0–0.5)
Eosinophils Relative: 4 %
HCT: 34 % — ABNORMAL LOW (ref 36.0–46.0)
Hemoglobin: 10.7 g/dL — ABNORMAL LOW (ref 12.0–15.0)
Immature Granulocytes: 0 %
Lymphocytes Relative: 26 %
Lymphs Abs: 0.8 10*3/uL (ref 0.7–4.0)
MCH: 30.4 pg (ref 26.0–34.0)
MCHC: 31.5 g/dL (ref 30.0–36.0)
MCV: 96.6 fL (ref 80.0–100.0)
Monocytes Absolute: 0.3 10*3/uL (ref 0.1–1.0)
Monocytes Relative: 10 %
Neutro Abs: 1.8 10*3/uL (ref 1.7–7.7)
Neutrophils Relative %: 59 %
Platelets: 45 10*3/uL — ABNORMAL LOW (ref 150–400)
RBC: 3.52 MIL/uL — ABNORMAL LOW (ref 3.87–5.11)
RDW: 14.8 % (ref 11.5–15.5)
WBC: 3.1 10*3/uL — ABNORMAL LOW (ref 4.0–10.5)
nRBC: 0 % (ref 0.0–0.2)

## 2018-07-12 LAB — HEPATITIS B SURFACE ANTIGEN: Hepatitis B Surface Ag: NEGATIVE

## 2018-07-12 LAB — HEPARIN INDUCED PLATELET AB (HIT ANTIBODY): Heparin Induced Plt Ab: 0.862 OD — ABNORMAL HIGH (ref 0.000–0.400)

## 2018-07-12 LAB — SEDIMENTATION RATE
Sed Rate: 16 mm/hr (ref 0–22)
Sed Rate: 17 mm/hr (ref 0–22)

## 2018-07-12 LAB — C-REACTIVE PROTEIN
CRP: 1 mg/dL — ABNORMAL HIGH (ref <1.0)
CRP: 0.9 mg/dL (ref <1.0)

## 2018-07-12 LAB — ECHOCARDIOGRAM COMPLETE
Height: 60 in
Weight: 1844.81 oz

## 2018-07-12 LAB — RENAL FUNCTION PANEL
Albumin: 3.3 g/dL — ABNORMAL LOW (ref 3.5–5.0)
Anion gap: 14 (ref 5–15)
BUN: 28 mg/dL — ABNORMAL HIGH (ref 6–20)
CO2: 26 mmol/L (ref 22–32)
Calcium: 9.3 mg/dL (ref 8.9–10.3)
Chloride: 96 mmol/L — ABNORMAL LOW (ref 98–111)
Creatinine, Ser: 4.44 mg/dL — ABNORMAL HIGH (ref 0.44–1.00)
GFR calc Af Amer: 13 mL/min — ABNORMAL LOW (ref >60)
GFR calc non Af Amer: 12 mL/min — ABNORMAL LOW (ref >60)
Glucose, Bld: 83 mg/dL (ref 70–99)
Phosphorus: 3.2 mg/dL (ref 2.5–4.6)
Potassium: 3.8 mmol/L (ref 3.5–5.1)
Sodium: 136 mmol/L (ref 135–145)

## 2018-07-12 LAB — MAGNESIUM: Magnesium: 2.3 mg/dL (ref 1.7–2.4)

## 2018-07-12 MED ORDER — PANTOPRAZOLE SODIUM 40 MG PO TBEC
40.0000 mg | DELAYED_RELEASE_TABLET | Freq: Every day | ORAL | Status: DC
  Administered 2018-07-12 – 2018-07-13 (×2): 40 mg via ORAL
  Filled 2018-07-12 (×2): qty 1

## 2018-07-12 MED ORDER — CALCITRIOL 0.25 MCG PO CAPS
0.2500 ug | ORAL_CAPSULE | ORAL | Status: DC
  Filled 2018-07-12: qty 1

## 2018-07-12 MED ORDER — RENA-VITE PO TABS
1.0000 | ORAL_TABLET | Freq: Every day | ORAL | Status: DC
  Administered 2018-07-12: 1 via ORAL
  Filled 2018-07-12: qty 1

## 2018-07-12 MED ORDER — CHLORHEXIDINE GLUCONATE CLOTH 2 % EX PADS
6.0000 | MEDICATED_PAD | Freq: Every day | CUTANEOUS | Status: DC
  Administered 2018-07-12: 6 via TOPICAL

## 2018-07-12 MED ORDER — ALUM & MAG HYDROXIDE-SIMETH 200-200-20 MG/5ML PO SUSP: 15.0000 mL | Freq: Four times a day (QID) | ORAL | Status: DC | PRN

## 2018-07-12 NOTE — Progress Notes (Signed)
Spoke with HD about pt being on the schedule for today or not. RN was informed that pt is supposed to have HD today, but not sure on the time. RN was told not to give BP meds just in case pt is going this am. RN will not give BP meds.   Eleanora Neighbor, RN

## 2018-07-12 NOTE — Progress Notes (Signed)
[facility] Progress Note   Dialysis Orders:  [place]   on TTS . EDW 55 (JUST Decreased 01/25)  HD Bath 2k, 2ca  Time  4  Heparin NONE ( recently changed with LOW PLTS HIT Pend). Access LUA AVF     Calcitriol 0.78mcg po HD  Mircera  150 mcg  q 2 wks ( last given 07/01/18)   Venofer 50 mg q 2wks   Assessment/Plan: 1. Low grade temps - CT suggests she could have atx vs PNA LLL and ligula - Rx azithromycin and rocephin x 1- per primary 2. CHF -per CT with large pericardial effusion (on midoxidil)  serially lowering of EDW - for ECHO today; ? High output CHF from AVF- cards consult pending Echo results 2. ESRD - TTS - had extra tmt 3 hr Sunday - net UF 3 L - post bed scale wt 52.3 - next HD Tuesday 3. Anemia - hgb 10.7 -hold on ESA for now 4. Secondary hyperparathyroidism - VDRA- resume binders if P goes up 5. HTN/volume - volume improved - BP ok - on minoxidil, amlodipine and hydralazine/spironolatone- stop midoxidil 6. Nutrition - unintentional weight loss -change to renal diet/vitamin -  7. Thrombocytopenia - plts 55 - HIT drawn 1/26 no heparin with HD  Myriam Jacobson, PA-C [facility] [phone] 07/12/2018,9:17 AM  LOS: 1 day   Subjective:   Never had CP, only SOB - no SOB at present. Tolerated HD well yesterday. Objective Vitals:   07/11/18 2104 07/12/18 0451 07/12/18 0500 07/12/18 0848  BP: 133/89 (!) 152/96  (!) 141/90  Pulse: 100 94  (!) 101  Resp: 18 18  18   Temp: 99.4 F (37.4 C) 98.4 F (36.9 C)    TempSrc: Oral     SpO2: 94% 92%  93%  Weight:   52.3 kg   Height:       Physical Exam General:sitting up in bed on O2 NAD breathing easily Heart: RRR ~100bounding + S4 no rub Lungs: >? Faint crackle at base - hard to hear due to bounding heart rate Abdomen: soft NT + BS Extremities: no LE dema Dialysis Access: large AVF+ bruit  Additional Objective Labs: Basic Metabolic Panel: Recent Labs  Lab 07/10/18 2217 07/11/18 0639  07/12/18 0450  NA 135 135 136  K 3.5 3.6 3.8  CL 95* 95* 96*  CO2 32 30 26  GLUCOSE 123* 88 83  BUN 13 16 28*  CREATININE 2.80* 3.38* 4.44*  CALCIUM 9.0 9.2 9.3  PHOS  --   --  3.2   Liver Function Tests: Recent Labs  Lab 07/11/18 0639 07/12/18 0450  AST 19  --   ALT 16  --   ALKPHOS 39  --   BILITOT 2.0*  --   PROT 6.3*  --   ALBUMIN 3.3* 3.3*   No results for input(s): LIPASE, AMYLASE in the last 168 hours. CBC: Recent Labs  Lab 07/09/18 0053 07/09/18 0529 07/10/18 2217 07/11/18 0639 07/12/18 0450  WBC 4.1 3.4* 3.9* 2.9* 3.1*  NEUTROABS 3.1  --  2.6  --  1.8  HGB 10.6* 10.8* 11.3* 11.0* 10.7*  HCT 34.0* 34.4* 36.4 34.3* 34.0*  MCV 98.0 98.9 97.3 96.3 96.6  PLT 79* 79* 51* 50* 45*   Blood Culture No results found for: SDES, SPECREQUEST, CULT, REPTSTATUS  Cardiac Enzymes: Recent Labs  Lab 07/09/18 0529 07/10/18 2217  TROPONINI 0.03* 0.03*   CBG: No results for input(s): GLUCAP in the last 168 hours.  Iron Studies: No results for input(s): IRON, TIBC, TRANSFERRIN, FERRITIN in the last 72 hours. Lab Results  Component Value Date   INR 1.14 06/24/2017   INR 1.07 06/19/2017   INR 1.07 02/13/2017   Studies/Results: Dg Chest 2 View  Result Date: 07/10/2018 CLINICAL DATA:  Dyspnea EXAM: CHEST - 2 VIEW COMPARISON:  07/09/2018 FINDINGS: Stable cardiomegaly with interstitial edema and small bilateral pleural effusions. Pulmonary consolidations with air bronchograms are suggested bilaterally. Pneumonia is not excluded. IMPRESSION: Redemonstration of cardiomegaly, small bilateral pleural effusions and CHF. Superimposed pneumonia in the lower lobes is not excluded given air bronchograms and confluent airspace opacities noted. Atelectasis is possibility for this as well. Electronically Signed   By: Ashley Royalty M.D.   On: 07/10/2018 23:21   Ct Angio Chest Pe W And/or Wo Contrast  Result Date: 07/11/2018 CLINICAL DATA:  Cough for the past week EXAM: CT ANGIOGRAPHY  CHEST WITH CONTRAST TECHNIQUE: Multidetector CT imaging of the chest was performed using the standard protocol during bolus administration of intravenous contrast. Multiplanar CT image reconstructions and MIPs were obtained to evaluate the vascular anatomy. CONTRAST:  75 cc ISOVUE-370 IOPAMIDOL (ISOVUE-370) INJECTION 76% COMPARISON:  Same day CXR FINDINGS: Cardiovascular: Cardiomegaly with moderate pericardial effusion between 10 mm posteriorly, 20 mm right lateral and 15 mm ventrally. No acute pulmonary embolus. Nonaneurysmal thoracic aorta without dissection. Mediastinum/Nodes: No enlarged mediastinal, hilar, or axillary lymph nodes. Thyroid gland, trachea, and esophagus demonstrate no significant findings. Lungs/Pleura: Patchy ground-glass opacities bilaterally consistent with CHF associated with small right and trace left pleural effusions. Associated lower lobe atelectasis. Consolidations in the lingula and left lower lobe compatible with atelectasis and/or pneumonia. Upper Abdomen: Nonacute Musculoskeletal: No chest wall abnormality. No acute or significant osseous findings. Review of the MIP images confirms the above findings. IMPRESSION: 1. No acute pulmonary embolus. 2. Small right and trace left pleural effusions with bibasilar atelectasis. Lingular and left lower lobe consolidations compatible with atelectasis and/or pneumonia. 3. Cardiomegaly with pericardial effusion measuring up to 2 cm in thickness. Ground-glass opacities throughout both lungs compatible with CHF. Electronically Signed   By: Ashley Royalty M.D.   On: 07/11/2018 01:28   Medications: . sodium chloride     . amLODipine  10 mg Oral QHS  . carvedilol  25 mg Oral BID WC  . hydrALAZINE  25 mg Oral Q8H  . minoxidil  5 mg Oral BID  . sodium chloride flush  3 mL Intravenous Q12H  . spironolactone  50 mg Oral Daily

## 2018-07-12 NOTE — Consult Note (Addendum)
Cardiology Consultation:   Patient ID: Madeline Brown MRN: [id number]; DOB: 1975-09-01  Admit date: 07/10/2018 Date of Consult: 07/12/2018  Primary Care Provider: Patient, No Pcp Per Primary Cardiologist: Jenkins Rouge, MD  Primary Electrophysiologist:  None    Patient Profile:   Madeline Brown is a 43 y.o. female with a hx of HTN, tobacco abuse and hx of acute renal failure, and now ESRD on HD 1 st done 06/24/17 (is TTS days),  thrombocytopenia who is being seen today for the evaluation of pericardial effusion at the request of [place] with admit 07/11/18 for Shortness of breath with hypoxic resp. Failure needing emergency dialysis.      History of Present Illness:   Madeline Brown admitted 07/11/18 with acute respiratory failure, and urgent dialysis.  She was admitted 07/08/18 with same diagnosis.  There was question of PNA on CXR so CT angio of chest was done to rule out PE which was neg for PE, small rt and tr lt pleural effusions with bibasilar atelectasis.   Lingular and left lower lobe consolidations compatible with atelectasis and/or pneumonia, and cardiomegaly with pericardial effusion  Up to 2 cm in thickness.    Echo was done with EF 60-65%, no RWMA, E7MC, diastolic flattening with ventricular septum.  RA severely dilated.  PA pk pressure 54 mmHg.  Small to mod pericardial effusion was identified, circumferential to the heart with no hemodynamic compromise.  Effusion was present in 2018 but much smaller.   EKG:  The EKG was personally reviewed and demonstrates:  SR and no acute changes.   Telemetry:  Telemetry was personally reviewed and demonstrates:  Not on tele.   On last visit she had NSVT at times.  Troponin 0.03 Na 136, K+ 3.8 Cr 4.44 mg+ 2.3  CRP 1.0  Hgb 10.7, WBC 3.1 plts 45 K Hep B core Ab tot +   Currently feeling better she has seen pulmonary as well.  Plan for outpt sleep study, and auto immune work up.    BP 152/96 and 141/90  No chest pain and no SOB now.    Past  Medical History:  Diagnosis Date  . Chronic kidney disease   . Hypertension     Past Surgical History:  Procedure Laterality Date  . AV FISTULA PLACEMENT Left 06/24/2017   Procedure: CREATION OF LEFT ARM BRACHIALCEPHALIC  ARTERIOVENOUS (AV) FISTULA;  Surgeon: Rosetta Posner, MD;  Location: [facility];  Service: Vascular;  Laterality: Left;  . CESAREAN SECTION     x2  . FISTULA SUPERFICIALIZATION Left 10/28/2017   Procedure: SUPERFICIALIZATION LEFT BRACHIOCEPHALIC ARTERIOVENOUS FISTULA;  Surgeon: Rosetta Posner, MD;  Location: [facility];  Service: Vascular;  Laterality: Left;  . INSERTION OF DIALYSIS CATHETER Right 06/24/2017   Procedure: INSERTION OF TUNNELED DIALYSIS CATHETER;  Surgeon: Rosetta Posner, MD;  Location: [facility];  Service: Vascular;  Laterality: Right;  . LIGATION OF COMPETING BRANCHES OF ARTERIOVENOUS FISTULA  10/28/2017   Procedure: LIGATION OF COMPETING BRANCHES OF ARTERIOVENOUS FISTULA x3;  Surgeon: Rosetta Posner, MD;  Location: [facility];  Service: Vascular;;     Home Medications:  Prior to Admission medications   Medication Sig Start Date End Date Taking? Authorizing Provider  amLODipine (NORVASC) 10 MG tablet Take 10 mg by mouth at bedtime.   Yes [provider]  carvedilol (COREG) 25 MG tablet Take 1 tablet (25 mg total) by mouth 2 (two) times daily with a meal. 06/27/17 10/23/19 Yes Adhikari, Tamsen Meek, MD  hydrALAZINE (APRESOLINE) 100 MG  tablet Take 1 tablet (100 mg total) by mouth every 8 (eight) hours. 06/27/17 10/23/18 Yes Adhikari, Tamsen Meek, MD  minoxidil (LONITEN) 2.5 MG tablet Take 5 mg by mouth 2 (two) times daily. 01/22/18  Yes [provider]  RENAGEL 800 MG tablet Take 1,600 mg by mouth 3 (three) times daily with meals. 08/26/17  Yes [provider]  spironolactone (ALDACTONE) 50 MG tablet Take 50 mg by mouth daily.   Yes [provider]    Inpatient Medications: Scheduled Meds: . amLODipine  10 mg Oral QHS  . [START ON 07/13/2018] calcitRIOL  0.25 mcg  Oral Q T,Th,Sa-HD  . carvedilol  25 mg Oral BID WC  . Chlorhexidine Gluconate Cloth  6 each Topical Q0600  . hydrALAZINE  25 mg Oral Q8H  . multivitamin  1 tablet Oral QHS  . pantoprazole  40 mg Oral Daily  . sodium chloride flush  3 mL Intravenous Q12H  . spironolactone  50 mg Oral Daily   Continuous Infusions: . sodium chloride     PRN Meds: sodium chloride, sodium chloride flush  Allergies:    Allergies  Allergen Reactions  . Visine [Tetrahydrozoline Hcl] Swelling    Eyes Swelling.    Social History:   Social History   Socioeconomic History  . Marital status: Single    Spouse name: Not on file  . Number of children: Not on file  . Years of education: Not on file  . Highest education level: Not on file  Occupational History  . Not on file  Social Needs  . Financial resource strain: Not on file  . Food insecurity:    Worry: Not on file    Inability: Not on file  . Transportation needs:    Medical: Not on file    Non-medical: Not on file  Tobacco Use  . Smoking status: Current Every Day Smoker    Packs/day: 0.25    Years: 15.00    Pack years: 3.75    Types: Cigarettes  . Smokeless tobacco: Never Used  . Tobacco comment: 4 cigarettes per day  Substance and Sexual Activity  . Alcohol use: Yes    Comment: occassional beer  . Drug use: Yes    Types: Marijuana  . Sexual activity: Yes    Birth control/protection: None  Lifestyle  . Physical activity:    Days per week: Not on file    Minutes per session: Not on file  . Stress: Not on file  Relationships  . Social connections:    Talks on phone: Not on file    Gets together: Not on file    Attends religious service: Not on file    Active member of club or organization: Not on file    Attends meetings of clubs or organizations: Not on file    Relationship status: Not on file  . Intimate partner violence:    Fear of current or ex partner: Not on file    Emotionally abused: Not on file    Physically  abused: Not on file    Forced sexual activity: Not on file  Other Topics Concern  . Not on file  Social History Narrative  . Not on file    Family History:    Family History  Problem Relation Age of Onset  . Diabetes Father   . Hypertension Father   . Cancer Father   . Heart disease Father      ROS:  Please see the history of present illness.  General:no colds or fevers, no weight changes Skin:no rashes or ulcers HEENT:no blurred vision, no congestion CV:see HPI PUL:see HPI GI:no diarrhea constipation or melena, no indigestion GU:no hematuria, no dysuria MS:no joint pain, no claudication Neuro:no syncope, no lightheadedness Endo:no diabetes, no thyroid disease  All other ROS reviewed and negative.     Physical Exam/Data:   Vitals:   07/11/18 2104 07/12/18 0451 07/12/18 0500 07/12/18 0848  BP: 133/89 (!) 152/96  (!) 141/90  Pulse: 100 94  (!) 101  Resp: 18 18  18   Temp: 99.4 F (37.4 C) 98.4 F (36.9 C)  97.7 F (36.5 C)  TempSrc: Oral   Oral  SpO2: 94% 92%  93%  Weight:   52.3 kg   Height:        Intake/Output Summary (Last 24 hours) at 07/12/2018 1539 Last data filed at 07/12/2018 1300 Gross per 24 hour  Intake 1420 ml  Output 0 ml  Net 1420 ml   Last 3 Weights 07/12/2018 07/11/2018 07/11/2018  Weight (lbs) 115 lb 4.8 oz 115 lb 4.8 oz 115 lb 4.8 oz  Weight (kg) 52.3 kg 52.3 kg 52.3 kg     Body mass index is 22.52 kg/m.  General:  Well nourished, well developed, in no acute distress, curerntly HEENT: normal Lymph: no adenopathy Neck: no JVD Endocrine:  No thryomegaly Vascular: Lt carotid bruit but radiation of graft; pedal pulses 2+ bilaterally  Cardiac:  normal S1, S2; RRR; soft murmur no gallup rub or click Lungs:  clear to auscultation bilaterally, no wheezing, rhonchi or rales  Abd: soft, nontender, no hepatomegaly  Ext: no edema Musculoskeletal:  No deformities, BUE and BLE strength normal and equal Skin: warm and dry  Neuro:  Alert and  oriented X 3 MAE follows commands, no focal abnormalities noted Psych:  Normal affect   Relevant CV Studies: 07/12/18 TTE Study Conclusions  - Left ventricle: The cavity size was normal. There was severe   concentric hypertrophy. Systolic function was normal. The   estimated ejection fraction was in the range of 60% to 65%. Wall   motion was normal; there were no regional wall motion   abnormalities. Doppler parameters are consistent with abnormal   left ventricular relaxation (grade 1 diastolic dysfunction). - Ventricular septum: The contour showed diastolic flattening. - Right ventricle: The cavity size was mildly dilated. Wall   thickness was normal. - Right atrium: The atrium was severely dilated. - Pulmonary arteries: Systolic pressure was moderately increased.   PA peak pressure: 54 mm Hg (S). - Pericardium, extracardiac: A small to moderate pericardial   effusion was identified circumferential to the heart. There was   no evidence of hemodynamic compromise.  Impressions:  - Pericardial effusion was present but smaller on prior   echocardiogram.  02/13/18 TTE Study Conclusions  - Left ventricle: The cavity size was normal. Wall thickness was   increased in a pattern of moderate LVH. Systolic function was   normal. The estimated ejection fraction was in the range of 50%   to 55%. The study is not technically sufficient to allow   evaluation of LV diastolic function. - Mitral valve: There was mild regurgitation. - Pericardium, extracardiac: A trivial pericardial effusion was   identified.  Laboratory Data:  Chemistry Recent Labs  Lab 07/10/18 2217 07/11/18 0639 07/12/18 0450  NA 135 135 136  K 3.5 3.6 3.8  CL 95* 95* 96*  CO2 32 30 26  GLUCOSE 123* 88 83  BUN 13 16 28*  CREATININE 2.80* 3.38* 4.44*  CALCIUM 9.0 9.2 9.3  GFRNONAA 20* 16* 12*  GFRAA 23* 19* 13*  ANIONGAP 8 10 14     Recent Labs  Lab 07/11/18 0639 07/12/18 0450  PROT 6.3*  --     ALBUMIN 3.3* 3.3*  AST 19  --   ALT 16  --   ALKPHOS 39  --   BILITOT 2.0*  --    Hematology Recent Labs  Lab 07/10/18 2217 07/11/18 0639 07/12/18 0450  WBC 3.9* 2.9* 3.1*  RBC 3.74* 3.56* 3.52*  HGB 11.3* 11.0* 10.7*  HCT 36.4 34.3* 34.0*  MCV 97.3 96.3 96.6  MCH 30.2 30.9 30.4  MCHC 31.0 32.1 31.5  RDW 14.7 14.7 14.8  PLT 51* 50* 45*   Cardiac Enzymes Recent Labs  Lab 07/09/18 0529 07/10/18 2217  TROPONINI 0.03* 0.03*   No results for input(s): TROPIPOC in the last 168 hours.  BNPNo results for input(s): BNP, PROBNP in the last 168 hours.  DDimer No results for input(s): DDIMER in the last 168 hours.  Radiology/Studies:  Dg Chest 2 View  Result Date: 07/10/2018 CLINICAL DATA:  Dyspnea EXAM: CHEST - 2 VIEW COMPARISON:  07/09/2018 FINDINGS: Stable cardiomegaly with interstitial edema and small bilateral pleural effusions. Pulmonary consolidations with air bronchograms are suggested bilaterally. Pneumonia is not excluded. IMPRESSION: Redemonstration of cardiomegaly, small bilateral pleural effusions and CHF. Superimposed pneumonia in the lower lobes is not excluded given air bronchograms and confluent airspace opacities noted. Atelectasis is possibility for this as well. Electronically Signed   By: Ashley Royalty M.D.   On: 07/10/2018 23:21   Ct Angio Chest Pe W And/or Wo Contrast  Result Date: 07/11/2018 CLINICAL DATA:  Cough for the past week EXAM: CT ANGIOGRAPHY CHEST WITH CONTRAST TECHNIQUE: Multidetector CT imaging of the chest was performed using the standard protocol during bolus administration of intravenous contrast. Multiplanar CT image reconstructions and MIPs were obtained to evaluate the vascular anatomy. CONTRAST:  75 cc ISOVUE-370 IOPAMIDOL (ISOVUE-370) INJECTION 76% COMPARISON:  Same day CXR FINDINGS: Cardiovascular: Cardiomegaly with moderate pericardial effusion between 10 mm posteriorly, 20 mm right lateral and 15 mm ventrally. No acute pulmonary embolus.  Nonaneurysmal thoracic aorta without dissection. Mediastinum/Nodes: No enlarged mediastinal, hilar, or axillary lymph nodes. Thyroid gland, trachea, and esophagus demonstrate no significant findings. Lungs/Pleura: Patchy ground-glass opacities bilaterally consistent with CHF associated with small right and trace left pleural effusions. Associated lower lobe atelectasis. Consolidations in the lingula and left lower lobe compatible with atelectasis and/or pneumonia. Upper Abdomen: Nonacute Musculoskeletal: No chest wall abnormality. No acute or significant osseous findings. Review of the MIP images confirms the above findings. IMPRESSION: 1. No acute pulmonary embolus. 2. Small right and trace left pleural effusions with bibasilar atelectasis. Lingular and left lower lobe consolidations compatible with atelectasis and/or pneumonia. 3. Cardiomegaly with pericardial effusion measuring up to 2 cm in thickness. Ground-glass opacities throughout both lungs compatible with CHF. Electronically Signed   By: Ashley Royalty M.D.   On: 07/11/2018 01:28   Dg Chest Port 1 View  Result Date: 07/09/2018 CLINICAL DATA:  Shortness of breath and cough for 1 day. History of end-stage renal disease on dialysis. EXAM: PORTABLE CHEST 1 VIEW COMPARISON:  Chest radiograph March 10, 2018 FINDINGS: Moderate to severe cardiomegaly, increased from prior examination. Interstitial prominence with patchy bibasilar airspace opacities. Small bilateral pleural effusions. No pneumothorax. Soft tissue planes and included osseous structures are non suspicious. IMPRESSION: 1. Worsening cardiomegaly with interstitial prominence suspicious for pulmonary edema. 2.  Bibasilar confluent edema versus pneumonia with small pleural effusions. Electronically Signed   By: Elon Alas M.D.   On: 07/09/2018 00:43    Assessment and Plan:   1. Pericardial effusion, small to moderate pericardial effusion.  No hemodynamic compromise.  Possibly due to renal  failure Dr. Debara Pickett to see. EF is normal. ? Due to minoxidil has been on this year but not sure when started.  Was not on in 10/2017.  Will stop. Will monitor effusion.  2. Acute resp. Failure with pulmonary edema and need for urgent dialysis.  Again could be due to minoxidil.   3. HTN currenlty controlled on amlodipine, 10, coreg 25 BID, hydralazine 25 every 8 hours ( at home 100 mg every 8 hours), aldactone 50 mg daily.  At home on minoxidil 5 mg BID not on here.      4. NSVT on last admit,  May benefit from tele. 5. Thrombocytopenia.  6. ESRD on HD now with 2 admits for acute pulmonary edema and resp. Failure.        For questions or updates, please contact [place] Please consult [email or web address] for contact info under     Signed, Cecilie Kicks, NP  07/12/2018 3:39 PM

## 2018-07-12 NOTE — Progress Notes (Signed)
PROGRESS NOTE    Madeline Brown  YNW:[id number] DOB: 04-20-1976 DOA: 07/10/2018 PCP: Patient, No Pcp Per  Outpatient Specialists:     Brief Narrative:  Madeline Brown is a 43 year old African-American female, with past medical history significant for end-stage renal disease on hemodialysis TTS, hypertension and treated for pericardial effusion as per prior echocardiogram.  Patient presented with shortness of breath.  Patient has been needing extra sessions of dialysis in the past few days.  Patient denied fever or cough.    Patient denied swelling.  On presentation, Patient was hypoxic requiring 5 L of oxygen to keep her O2 sats at 94%.  Initially, O2 sats was 72% on room air.    Hospitalist service was asked to admit patient for further assessment and work-up of acute hypoxic respiratory failure, and for further hemodialysis need.    07/12/2018: Patient had hemodialysis yesterday.  Patient looks better today.  Patient is less tachycardic today.  Echocardiogram done revealed moderate pericardial effusion and grade 1 diastolic dysfunction.  Cardiology consulted.  Pulmonary hypertension was also noted on the echocardiogram.  Pulmonary team also consulted.  Pancytopenia is noted.  Will check ANA, double-stranded DNA, ESR, CRP and HIV.  Further management will depend on above finding.  Patient may need oxygen on discharge.   Assessment & Plan:   Principal Problem:   Acute respiratory failure with hypoxia (HCC) Active Problems:   Hypertension   ESRD (end stage renal disease) (HCC)   Acute pulmonary edema (HCC)   Acute respiratory failure with hypoxia (HCC) -Etiology unclear.   -Possibilities include pulmonary edema/acute on chronic diastolic CHF, pulmonary hypertension, renal disease (ESRD and hemodialysis-query compliance and/efficacy).   -Patient improved after hemodialysis. -CT angios chest was negative for pulmonary embolism, but suggestive of CHF. -Further management will depend on  hospital course. -Continue hemodialysis. -Cardiology and pulmonary teams consulted.  Acute on chronic diastolic congestive heart failure: Continue hemodialysis.   Optimize salt and fluid intake.    Pulmonary hypertension:  Etiology unclear.   Connective tissue disease work-up.   Assess oxygen need on discharge.   Follow-up with pulmonary on discharge.    Pericardial effusion: Cardiology input is appreciated. Patient will follow with cardiology on outpatient basis, I will also have repeat echocardiogram in 1 month.  ESRD (end stage renal disease): Nephrology is managing.  Continue hemodialysis.   Hypertension: Continue to optimize. Monitor closely.  DVT prophylaxis: SCDs Code Status: Full Family Communication: None Disposition Plan:  Home eventually Consults called: Nephrology, cardiology and pulmonary team.  Procedures:   Echocardiogram  Antimicrobials:   None   Subjective: Shortness of breath is improving.  Objective: Vitals:   07/11/18 2104 07/12/18 0451 07/12/18 0500 07/12/18 0848  BP: 133/89 (!) 152/96  (!) 141/90  Pulse: 100 94  (!) 101  Resp: _0 Temp: 99.4 F (37.4 C) 98.4 F (36.9 C)  97.7 F (36.5 C)  TempSrc: Oral   Oral  SpO2: 94% 92%  93%  Weight:   52.3 kg   Height:        Intake/Output Summary (Last 24 hours) at 07/12/2018 1241 Last data filed at 07/12/2018 0600 Gross per 24 hour  Intake 840 ml  Output 3000 ml  Net -2160 ml   Filed Weights   07/11/18 1420 07/11/18 1811 07/12/18 0500  Weight: 52.3 kg 52.3 kg 52.3 kg    Examination:  General exam: Appears calm and comfortable, but mildly cachectic Respiratory system: Decreased air entry globally.    Cardiovascular system:  S1 & S2 heard. Gastrointestinal system: Abdomen is nondistended, soft and nontender. No organomegaly or masses felt. Normal bowel sounds heard. Central nervous system: Alert and oriented. No focal neurological deficits. Extremities: No leg  edema  Data Reviewed: I have personally reviewed following labs and imaging studies  CBC: Recent Labs  Lab 07/09/18 0053 07/09/18 0529 07/10/18 2217 07/11/18 0639 07/12/18 0450  WBC 4.1 3.4* 3.9* 2.9* 3.1*  NEUTROABS 3.1  --  2.6  --  1.8  HGB 10.6* 10.8* 11.3* 11.0* 10.7*  HCT 34.0* 34.4* 36.4 34.3* 34.0*  MCV 98.0 98.9 97.3 96.3 96.6  PLT 79* 79* 51* 50* 45*   Basic Metabolic Panel: Recent Labs  Lab 07/09/18 0053 07/09/18 0529 07/10/18 2217 07/11/18 0639 07/12/18 0450  NA 139  --  135 135 136  K 3.3*  --  3.5 3.6 3.8  CL 98  --  95* 95* 96*  CO2 32  --  32 30 26  GLUCOSE 115*  --  123* 88 83  BUN 16  --  13 16 28*  CREATININE 3.38* 3.87* 2.80* 3.38* 4.44*  CALCIUM 8.7*  --  9.0 9.2 9.3  MG  --   --   --   --  2.3  PHOS  --   --   --   --  3.2   GFR: Estimated Creatinine Clearance: 12 mL/min (A) (by C-G formula based on SCr of 4.44 mg/dL (H)). Liver Function Tests: Recent Labs  Lab 07/11/18 0639 07/12/18 0450  AST 19  --   ALT 16  --   ALKPHOS 39  --   BILITOT 2.0*  --   PROT 6.3*  --   ALBUMIN 3.3* 3.3*   No results for input(s): LIPASE, AMYLASE in the last 168 hours. No results for input(s): AMMONIA in the last 168 hours. Coagulation Profile: No results for input(s): INR, PROTIME in the last 168 hours. Cardiac Enzymes: Recent Labs  Lab 07/09/18 0529 07/10/18 2217  TROPONINI 0.03* 0.03*   BNP (last 3 results) No results for input(s): PROBNP in the last 8760 hours. HbA1C: No results for input(s): HGBA1C in the last 72 hours. CBG: No results for input(s): GLUCAP in the last 168 hours. Lipid Profile: No results for input(s): CHOL, HDL, LDLCALC, TRIG, CHOLHDL, LDLDIRECT in the last 72 hours. Thyroid Function Tests: No results for input(s): TSH, T4TOTAL, FREET4, T3FREE, THYROIDAB in the last 72 hours. Anemia Panel: No results for input(s): VITAMINB12, FOLATE, FERRITIN, TIBC, IRON, RETICCTPCT in the last 72 hours. Urine analysis:    Component  Value Date/Time   COLORURINE YELLOW 03/09/2018 2314   APPEARANCEUR CLEAR 03/09/2018 2314   LABSPEC 1.014 03/09/2018 2314   PHURINE 5.0 03/09/2018 2314   GLUCOSEU NEGATIVE 03/09/2018 2314   HGBUR NEGATIVE 03/09/2018 2314   BILIRUBINUR NEGATIVE 03/09/2018 2314   KETONESUR NEGATIVE 03/09/2018 2314   PROTEINUR 100 (A) 03/09/2018 2314   UROBILINOGEN 0.2 06/17/2016 1257   NITRITE NEGATIVE 03/09/2018 2314   LEUKOCYTESUR NEGATIVE 03/09/2018 2314   Sepsis Labs: _0 (procalcitonin:4,lacticidven:4)  ) Recent Results (from the past 240 hour(s))  MRSA PCR Screening     Status: None   Collection Time: 07/11/18  3:15 AM  Result Value Ref Range Status   MRSA by PCR NEGATIVE NEGATIVE Final    Comment:        The GeneXpert MRSA Assay (FDA approved for NASAL specimens only), is one component of a comprehensive MRSA colonization surveillance program. It is not intended to diagnose MRSA infection nor  to guide or monitor treatment for MRSA infections. Performed at [facility], [place] [address] [place], [place] [address]          Radiology Studies: Dg Chest 2 View  Result Date: 07/10/2018 CLINICAL DATA:  Dyspnea EXAM: CHEST - 2 VIEW COMPARISON:  07/09/2018 FINDINGS: Stable cardiomegaly with interstitial edema and small bilateral pleural effusions. Pulmonary consolidations with air bronchograms are suggested bilaterally. Pneumonia is not excluded. IMPRESSION: Redemonstration of cardiomegaly, small bilateral pleural effusions and CHF. Superimposed pneumonia in the lower lobes is not excluded given air bronchograms and confluent airspace opacities noted. Atelectasis is possibility for this as well. Electronically Signed   By: Ashley Royalty M.D.   On: 07/10/2018 23:21   Ct Angio Chest Pe W And/or Wo Contrast  Result Date: 07/11/2018 CLINICAL DATA:  Cough for the past week EXAM: CT ANGIOGRAPHY CHEST WITH CONTRAST TECHNIQUE: Multidetector CT imaging of the chest was performed using the  standard protocol during bolus administration of intravenous contrast. Multiplanar CT image reconstructions and MIPs were obtained to evaluate the vascular anatomy. CONTRAST:  75 cc ISOVUE-370 IOPAMIDOL (ISOVUE-370) INJECTION 76% COMPARISON:  Same day CXR FINDINGS: Cardiovascular: Cardiomegaly with moderate pericardial effusion between 10 mm posteriorly, 20 mm right lateral and 15 mm ventrally. No acute pulmonary embolus. Nonaneurysmal thoracic aorta without dissection. Mediastinum/Nodes: No enlarged mediastinal, hilar, or axillary lymph nodes. Thyroid gland, trachea, and esophagus demonstrate no significant findings. Lungs/Pleura: Patchy ground-glass opacities bilaterally consistent with CHF associated with small right and trace left pleural effusions. Associated lower lobe atelectasis. Consolidations in the lingula and left lower lobe compatible with atelectasis and/or pneumonia. Upper Abdomen: Nonacute Musculoskeletal: No chest wall abnormality. No acute or significant osseous findings. Review of the MIP images confirms the above findings. IMPRESSION: 1. No acute pulmonary embolus. 2. Small right and trace left pleural effusions with bibasilar atelectasis. Lingular and left lower lobe consolidations compatible with atelectasis and/or pneumonia. 3. Cardiomegaly with pericardial effusion measuring up to 2 cm in thickness. Ground-glass opacities throughout both lungs compatible with CHF. Electronically Signed   By: Ashley Royalty M.D.   On: 07/11/2018 01:28        Scheduled Meds: . amLODipine  10 mg Oral QHS  . [START ON 07/13/2018] calcitRIOL  0.25 mcg Oral Q T,Th,Sa-HD  . carvedilol  25 mg Oral BID WC  . Chlorhexidine Gluconate Cloth  6 each Topical Q0600  . hydrALAZINE  25 mg Oral Q8H  . multivitamin  1 tablet Oral QHS  . pantoprazole  40 mg Oral Daily  . sodium chloride flush  3 mL Intravenous Q12H  . spironolactone  50 mg Oral Daily   Continuous Infusions: . sodium chloride       LOS: 1 day     Time spent: 35 minutes    Dana Allan, MD  [facility] Pager #: [phone] 7PM-7AM contact night coverage as above

## 2018-07-12 NOTE — Progress Notes (Signed)
Pt c/o stomach pain at 5/10 and nausea, no prn meds. Messaged NP on call. Awaiting response.   Eleanora Neighbor, RN

## 2018-07-12 NOTE — Progress Notes (Signed)
  Echocardiogram 2D Echocardiogram has been performed.  Madeline Brown 07/12/2018, 11:37 AM

## 2018-07-12 NOTE — Consult Note (Addendum)
NAME:  Madeline Brown, MRN:  [id number], DOB:  1975-07-05, LOS: 1 ADMISSION DATE:  07/10/2018, CONSULTATION DATE:  07/12/2018 REFERRING MD:  [facility] - [place], CHIEF COMPLAINT:  SOB   Brief History   43 year old female with history of HTN resulting in ESRD.  Patient came to the ED with SOB and noted to have pulmonary edema and hypoxemia.  Patient was originally on 7 liter saturating 92%.  Post dialysis the patient came down to RA.  2D echo was done that showed pulmonary HTN with PAP of 58 mmHg and PCCM was called on consult.  Patient denies fever, chills, cough, sputum production, sick contact, any history of autoimmune disorders or family history of such.  2D echo also showed pericardial effusion.  History of present illness   43 year old female with history of HTN resulting in ESRD.  Patient came to the ED with SOB and noted to have pulmonary edema and hypoxemia.  Patient was originally on 7 liter saturating 92%.  Post dialysis the patient came down to RA.  2D echo was done that showed pulmonary HTN with PAP of 58 mmHg and PCCM was called on consult.  Patient denies fever, chills, cough, sputum production, sick contact, any history of autoimmune disorders or family history of such.  2D echo also showed pericardial effusion.  Past Medical History  ESRD due to hypertension  Significant Hospital Events   1/26 admission for SOB and hypoxemia  Consults:  PCCM Cardiology Nephrology  Procedures:  HD  Significant Diagnostic Tests:  2D echo with PAP of 58 and pericardial effusion that is unchanged  Micro Data:  N/A  Antimicrobials:  N/A   Interim history/subjective:  Feels better since dialysis  I reviewed chest CT myself, atelectasis, pericardial effusion and pulmonary edema noted  Objective   Blood pressure (!) 141/90, pulse (!) 101, temperature 97.7 F (36.5 C), temperature source Oral, resp. rate 18, height 5' (1.524 m), weight 52.3 kg, SpO2 93 %, unknown if currently breastfeeding.        Intake/Output Summary (Last 24 hours) at 07/12/2018 1514 Last data filed at 07/12/2018 1300 Gross per 24 hour  Intake 1420 ml  Output 0 ml  Net 1420 ml   Filed Weights   07/11/18 1420 07/11/18 1811 07/12/18 0500  Weight: 52.3 kg 52.3 kg 52.3 kg    Examination: General: Chronically ill appearing female, NAD HENT: [place]/AT, PERRL, EOM-I and MMM Lungs: Bibasilar crackles noted Cardiovascular: RRR, Nl S1/S2 and -M/R/G Abdomen: Soft, NT, ND and +BS Extremities: -edema and -tenderness Neuro: Alert and interactive, moving all ext to command Skin: Intact  Resolved Hospital Problem list   Hypoxemia  Assessment & Plan:  43 year old female with PMH of ESRD who presents with acute pulmonary edema, hypoxemia, pulmonary HTN and atelectasis.  Discussed with PCCM-NP.  Hypoxemia:  - Titrate O2 for sat of 90-95% given pulmonary HTN  - If remains on O2 after dialysis then will need an ambulatory desaturation study prior to discharge for ?of home O2 need.  Atelectasis:  - IS  - Ambulate  Pulmonary edema:  - Need HD  - Limit intake  Pulmonary HTN: likely due to ESRD and CHF  - Send auto-immune work up  - Will need a sleep study as outpatient (patient snores)  - No need to start medications acutely inpatient  Arrange f/u with PCCM as outpatient for f/u on labs prior to discharge  PCCM will sign off, please call back if needed.  Labs  CBC: Recent Labs  Lab 07/09/18 0053 07/09/18 0529 07/10/18 2217 07/11/18 0639 07/12/18 0450  WBC 4.1 3.4* 3.9* 2.9* 3.1*  NEUTROABS 3.1  --  2.6  --  1.8  HGB 10.6* 10.8* 11.3* 11.0* 10.7*  HCT 34.0* 34.4* 36.4 34.3* 34.0*  MCV 98.0 98.9 97.3 96.3 96.6  PLT 79* 79* 51* 50* 45*    Basic Metabolic Panel: Recent Labs  Lab 07/09/18 0053 07/09/18 0529 07/10/18 2217 07/11/18 0639 07/12/18 0450  NA 139  --  135 135 136  K 3.3*  --  3.5 3.6 3.8  CL 98  --  95* 95* 96*  CO2 32  --  32 30 26  GLUCOSE 115*  --  123* 88 83  BUN 16  --   13 16 28*  CREATININE 3.38* 3.87* 2.80* 3.38* 4.44*  CALCIUM 8.7*  --  9.0 9.2 9.3  MG  --   --   --   --  2.3  PHOS  --   --   --   --  3.2   GFR: Estimated Creatinine Clearance: 12 mL/min (A) (by C-G formula based on SCr of 4.44 mg/dL (H)). Recent Labs  Lab 07/09/18 0529 07/10/18 2217 07/11/18 0639 07/12/18 0450  WBC 3.4* 3.9* 2.9* 3.1*    Liver Function Tests: Recent Labs  Lab 07/11/18 0639 07/12/18 0450  AST 19  --   ALT 16  --   ALKPHOS 39  --   BILITOT 2.0*  --   PROT 6.3*  --   ALBUMIN 3.3* 3.3*   No results for input(s): LIPASE, AMYLASE in the last 168 hours. No results for input(s): AMMONIA in the last 168 hours.  ABG    Component Value Date/Time   TCO2 18 (L) 06/18/2017 2213     Coagulation Profile: No results for input(s): INR, PROTIME in the last 168 hours.  Cardiac Enzymes: Recent Labs  Lab 07/09/18 0529 07/10/18 2217  TROPONINI 0.03* 0.03*    HbA1C: Hgb A1c MFr Bld  Date/Time Value Ref Range Status  02/13/2017 08:27 AM 4.6 (L) 4.8 - 5.6 % Final    Comment:    (NOTE) Pre diabetes:          5.7%-6.4% Diabetes:              >6.4% Glycemic control for   <7.0% adults with diabetes     CBG: No results for input(s): GLUCAP in the last 168 hours.  Review of Systems:   12 point ROS is negative other than above  Past Medical History  She,  has a past medical history of Chronic kidney disease and Hypertension.   Surgical History    Past Surgical History:  Procedure Laterality Date  . AV FISTULA PLACEMENT Left 06/24/2017   Procedure: CREATION OF LEFT ARM BRACHIALCEPHALIC  ARTERIOVENOUS (AV) FISTULA;  Surgeon: Rosetta Posner, MD;  Location: [facility];  Service: Vascular;  Laterality: Left;  . CESAREAN SECTION     x2  . FISTULA SUPERFICIALIZATION Left 10/28/2017   Procedure: SUPERFICIALIZATION LEFT BRACHIOCEPHALIC ARTERIOVENOUS FISTULA;  Surgeon: Rosetta Posner, MD;  Location: [facility];  Service: Vascular;  Laterality: Left;  . INSERTION OF  DIALYSIS CATHETER Right 06/24/2017   Procedure: INSERTION OF TUNNELED DIALYSIS CATHETER;  Surgeon: Rosetta Posner, MD;  Location: [facility];  Service: Vascular;  Laterality: Right;  . LIGATION OF COMPETING BRANCHES OF ARTERIOVENOUS FISTULA  10/28/2017   Procedure: LIGATION OF COMPETING BRANCHES OF ARTERIOVENOUS FISTULA x3;  Surgeon: Curt Jews  F, MD;  Location: [facility];  Service: Vascular;;     Social History   reports that she has been smoking cigarettes. She has a 3.75 pack-year smoking history. She has never used smokeless tobacco. She reports current alcohol use. She reports current drug use. Drug: Marijuana.   Family History   Her family history includes Cancer in her father; Diabetes in her father; Hypertension in her father.   Allergies Allergies  Allergen Reactions  . Visine [Tetrahydrozoline Hcl] Swelling    Eyes Swelling.     Home Medications  Prior to Admission medications   Medication Sig Start Date End Date Taking? Authorizing Provider  amLODipine (NORVASC) 10 MG tablet Take 10 mg by mouth at bedtime.   Yes [provider]  carvedilol (COREG) 25 MG tablet Take 1 tablet (25 mg total) by mouth 2 (two) times daily with a meal. 06/27/17 10/23/19 Yes Adhikari, Tamsen Meek, MD  hydrALAZINE (APRESOLINE) 100 MG tablet Take 1 tablet (100 mg total) by mouth every 8 (eight) hours. 06/27/17 10/23/18 Yes Adhikari, Tamsen Meek, MD  minoxidil (LONITEN) 2.5 MG tablet Take 5 mg by mouth 2 (two) times daily. 01/22/18  Yes [provider]  RENAGEL 800 MG tablet Take 1,600 mg by mouth 3 (three) times daily with meals. 08/26/17  Yes [provider]  spironolactone (ALDACTONE) 50 MG tablet Take 50 mg by mouth daily.   Yes [provider]    Rush Farmer, M.D. [facility] Pulmonary/Critical Care Medicine. Pager: [phone]. After hours pager: [phone].

## 2018-07-13 LAB — CBC WITH DIFFERENTIAL/PLATELET
Abs Immature Granulocytes: 0.01 10*3/uL (ref 0.00–0.07)
Basophils Absolute: 0 10*3/uL (ref 0.0–0.1)
Basophils Relative: 1 %
Eosinophils Absolute: 0.2 10*3/uL (ref 0.0–0.5)
Eosinophils Relative: 4 %
HCT: 32.6 % — ABNORMAL LOW (ref 36.0–46.0)
Hemoglobin: 10.5 g/dL — ABNORMAL LOW (ref 12.0–15.0)
Immature Granulocytes: 0 %
Lymphocytes Relative: 17 %
Lymphs Abs: 0.6 10*3/uL — ABNORMAL LOW (ref 0.7–4.0)
MCH: 30.6 pg (ref 26.0–34.0)
MCHC: 32.2 g/dL (ref 30.0–36.0)
MCV: 95 fL (ref 80.0–100.0)
Monocytes Absolute: 0.3 10*3/uL (ref 0.1–1.0)
Monocytes Relative: 8 %
Neutro Abs: 2.6 10*3/uL (ref 1.7–7.7)
Neutrophils Relative %: 70 %
Platelets: 65 10*3/uL — ABNORMAL LOW (ref 150–400)
RBC: 3.43 MIL/uL — ABNORMAL LOW (ref 3.87–5.11)
RDW: 14.4 % (ref 11.5–15.5)
WBC: 3.7 10*3/uL — ABNORMAL LOW (ref 4.0–10.5)
nRBC: 0 % (ref 0.0–0.2)

## 2018-07-13 LAB — RENAL FUNCTION PANEL
Albumin: 3.4 g/dL — ABNORMAL LOW (ref 3.5–5.0)
Anion gap: 15 (ref 5–15)
BUN: 44 mg/dL — ABNORMAL HIGH (ref 6–20)
CO2: 23 mmol/L (ref 22–32)
Calcium: 9.1 mg/dL (ref 8.9–10.3)
Chloride: 99 mmol/L (ref 98–111)
Creatinine, Ser: 5.73 mg/dL — ABNORMAL HIGH (ref 0.44–1.00)
GFR calc Af Amer: 10 mL/min — ABNORMAL LOW (ref >60)
GFR calc non Af Amer: 8 mL/min — ABNORMAL LOW (ref >60)
Glucose, Bld: 94 mg/dL (ref 70–99)
Phosphorus: 4.4 mg/dL (ref 2.5–4.6)
Potassium: 3.8 mmol/L (ref 3.5–5.1)
Sodium: 137 mmol/L (ref 135–145)

## 2018-07-13 LAB — ANCA TITERS
Atypical P-ANCA titer: <1:20 {titer}
C-ANCA: <1:20 {titer}
P-ANCA: <1:20 {titer}

## 2018-07-13 LAB — MPO/PR-3 (ANCA) ANTIBODIES
ANCA Proteinase 3: <3.5 U/mL (ref 0.0–3.5)
Myeloperoxidase Abs: <9 U/mL (ref 0.0–9.0)

## 2018-07-13 LAB — RHEUMATOID FACTOR: Rheumatoid fact SerPl-aCnc: <10 IU/mL (ref 0.0–13.9)

## 2018-07-13 LAB — ANTI-DNA ANTIBODY, DOUBLE-STRANDED: ds DNA Ab: 6 IU/mL (ref 0–9)

## 2018-07-13 LAB — MAGNESIUM: Magnesium: 2.3 mg/dL (ref 1.7–2.4)

## 2018-07-13 LAB — ANA W/REFLEX IF POSITIVE: Anti Nuclear Antibody(ANA): NEGATIVE

## 2018-07-13 LAB — ANTINUCLEAR ANTIBODIES, IFA: ANA Ab, IFA: NEGATIVE

## 2018-07-13 LAB — HIV ANTIBODY (ROUTINE TESTING W REFLEX): HIV Screen 4th Generation wRfx: NONREACTIVE

## 2018-07-13 MED ORDER — CALCITRIOL 0.25 MCG PO CAPS
ORAL_CAPSULE | ORAL | Status: AC
  Filled 2018-07-13: qty 1

## 2018-07-13 MED ORDER — RENA-VITE PO TABS: 1.0000 | ORAL_TABLET | Freq: Every day | ORAL | 0 refills | Status: DC

## 2018-07-13 MED ORDER — CALCITRIOL 0.25 MCG PO CAPS: 0.2500 ug | ORAL_CAPSULE | ORAL | 0 refills | Status: DC

## 2018-07-13 MED ORDER — HYDRALAZINE HCL 25 MG PO TABS: 25.0000 mg | ORAL_TABLET | Freq: Three times a day (TID) | ORAL | 0 refills | Status: DC

## 2018-07-13 NOTE — Procedures (Signed)
I was present at this dialysis session. I have reviewed the session itself and made appropriate changes.   Pre weight 53.6kg, UF goal 3L, using 4K bath K Is 3.8 and AVF working well.  Will need to see if can maintain SpO2 on RA post HD.    Hb 10.5.  Wil lcont to serially lower EDW at DC.   Filed Weights   07/12/18 0500 07/12/18 2015 07/13/18 0329  Weight: 52.3 kg 52.6 kg 52.6 kg    Recent Labs  Lab 07/13/18 0557  NA 137  K 3.8  CL 99  CO2 23  GLUCOSE 94  BUN 44*  CREATININE 5.73*  CALCIUM 9.1  PHOS 4.4    Recent Labs  Lab 07/10/18 2217 07/11/18 0639 07/12/18 0450 07/13/18 0557  WBC 3.9* 2.9* 3.1* 3.7*  NEUTROABS 2.6  --  1.8 2.6  HGB 11.3* 11.0* 10.7* 10.5*  HCT 36.4 34.3* 34.0* 32.6*  MCV 97.3 96.3 96.6 95.0  PLT 51* 50* 45* 65*    Scheduled Meds: . amLODipine  10 mg Oral QHS  . calcitRIOL  0.25 mcg Oral Q T,Th,Sa-HD  . carvedilol  25 mg Oral BID WC  . Chlorhexidine Gluconate Cloth  6 each Topical Q0600  . hydrALAZINE  25 mg Oral Q8H  . multivitamin  1 tablet Oral QHS  . pantoprazole  40 mg Oral Daily  . sodium chloride flush  3 mL Intravenous Q12H  . spironolactone  50 mg Oral Daily   Continuous Infusions: . sodium chloride     PRN Meds:.sodium chloride, sodium chloride flush   Pearson Grippe  MD 07/13/2018, 8:06 AM

## 2018-07-13 NOTE — Discharge Summary (Signed)
Physician Discharge Summary  Patient ID: Madeline Brown MRN: [id number] DOB/AGE: 42-Mar-1977 42 y.o.  Admit date: 07/10/2018 Discharge date: 07/13/2018  Admission Diagnoses:  Discharge Diagnoses:  Principal Problem:   Acute respiratory failure with hypoxia ([place]) Active Problems:   Hypertension   ESRD (end stage renal disease) ([place])   Acute pulmonary edema ([facility])   Pericardial effusion   Discharged Condition: stable  Hospital Course:  Madeline Brown a 43 year old African-American female, with past medical history significant for end-stage renal disease on hemodialysis TTS, hypertension and pericardial effusion as per prior echocardiogram.  Patient presented with shortness of breath.  Patient has needed extra sessions of hemodialysis in the last few days preceding admission.  Patient denied fever or cough.   Patient denied swelling.  On presentation,Patient was hypoxic, requiring 5L/Min of supplemental oxygen via nasal canula to keep her O2 sats at 94%. Initially, O2 sats was 72% on room air.  Patient was admitted for further assessment and management.  Patient was dialyzed repeatedly during the hospital stay.  With hemodialysis, patient improved significantly.  Echocardiogram done during the hospital stay revealed moderate pericardial effusion and grade 1 diastolic dysfunction.  Cardiology team was consulted.  Pulmonary hypertension was also noted on the echocardiogram.  Pulmonary team was also consulted.  Pancytopenia is noted.  Lab work for ANA, double-stranded DNA, ESR, CRP and HIV with sent.  Primary care provider will kindly follow the result of above pending lab work.  Patient will follow with cardiology team for repeat echocardiogram in about 32-month  Patient will follow with pulmonary team for further work-up of pulmonary hypertension on discharge.  Patient has been optimized, will be discharged back to the care of the primary care provider, cardiology and pulmonary team.  Acute  respiratory failure with hypoxia ([place] -Etiology unclear.   -Possibilities include pulmonary edema/acute on chronic diastolic CHF, pulmonary hypertension, renal disease (ESRD and hemodialysis-query compliance and/efficacy).   -Patient improved with hemodialysis. -CT angio chest was negative for pulmonary embolism, but suggestive of CHF. -Respiratory failure has resolved significantly.  -Patient has been cleared for discharge by the cardiology and pulmonary team. -Patient will follow with PCP, nephrology, cardiology and pulmonary team on discharge.  Acute on chronic diastolic congestive heart failure: Hemodialysis was continued during the hospital stay.   Continue to optimize salt and fluid intake.    Pulmonary hypertension:  Etiology unclear.   Connective tissue disease work-up is in progress. May be related to chronic heart problem (heart failure/pulmonary edema).     Follow-up with pulmonary on discharge.    Pericardial effusion: Cardiology input is appreciated. Patient will follow with cardiology on outpatient basis The plan is to repeat echocardiogram in 1 month.  ESRD (end stage renal disease): Continued hemodialysis.   Hypertension: Continue to optimize. Monitor closely.  Consults: cardiology, pulmonary/intensive care and nephrology  Significant Diagnostic Studies:   Discharge Exam: Blood pressure (!) 139/97, pulse 87, temperature 98.7 F (37.1 C), temperature source Oral, resp. rate 18, height 5' (1.524 m), weight 51.2 kg, SpO2 95 %, unknown if currently breastfeeding.  Disposition: Discharge disposition: 01-Home or Self Care   Discharge Instructions    Diet - low sodium heart healthy   Complete by:  As directed    Increase activity slowly   Complete by:  As directed      Allergies as of 07/13/2018      Reactions   Visine [tetrahydrozoline Hcl] Swelling   Eyes Swelling.      Medication List    STOP taking  these medications   minoxidil 2.5 MG  tablet Commonly known as:  LONITEN   RENAGEL 800 MG tablet Generic drug:  sevelamer     TAKE these medications   amLODipine 10 MG tablet Commonly known as:  NORVASC Take 10 mg by mouth at bedtime.   calcitRIOL 0.25 MCG capsule Commonly known as:  ROCALTROL Take 1 capsule (0.25 mcg total) by mouth Every Tuesday,Thursday,and Saturday with dialysis. Start taking on:  July 15, 2018   carvedilol 25 MG tablet Commonly known as:  COREG Take 1 tablet (25 mg total) by mouth 2 (two) times daily with a meal.   hydrALAZINE 25 MG tablet Commonly known as:  APRESOLINE Take 1 tablet (25 mg total) by mouth every 8 (eight) hours. What changed:    medication strength  how much to take   multivitamin Tabs tablet Take 1 tablet by mouth at bedtime.   spironolactone 50 MG tablet Commonly known as:  ALDACTONE Take 50 mg by mouth daily.        SignedBonnell Public 07/13/2018, 1:46 PM

## 2018-07-15 DIAGNOSIS — R0602 Shortness of breath: Secondary | ICD-10-CM | POA: Insufficient documentation

## 2018-07-15 HISTORY — DX: Shortness of breath: R06.02

## 2018-07-15 LAB — SEROTONIN RELEASE ASSAY (SRA)
SRA .2 IU/mL UFH Ser-aCnc: <1 % (ref 0–20)
SRA 100IU/mL UFH Ser-aCnc: 1 % (ref 0–20)

## 2018-08-10 NOTE — Progress Notes (Deleted)
Cardiology Office Note   Date:  08/10/2018   ID:  Madeline Brown, DOB 1975-08-27, MRN [id number]  PCP:  Patient, No Pcp Per  Cardiologist:   Jenkins Rouge, MD   No chief complaint on file.     History of Present Illness: Madeline Brown is a 43 y.o. female who presents for post hospital f/u pericardial effusion. Last seen by me in August 2018 for elevated troponin in setting of CRF and anemia. Seen 07/12/18 by Dr Debara Pickett. Patient ESRD now on dialysis , Smoker , Low Platelets Has been on minoxidil for HTN and TTE with small to moderate pericardial effusion.   TTE 07/12/18 EF 60-65% severe LVH Estimated PA pressure 54 mmHg  Small to moderate circumferential effusion no tamponade  She had high oxygen requirements CTA negative for PE  To f/u Dr Nelda Marseille auto immune studies negative Needs sleep study  ***     Past Medical History:  Diagnosis Date  . Chronic kidney disease   . Hypertension     Past Surgical History:  Procedure Laterality Date  . AV FISTULA PLACEMENT Left 06/24/2017   Procedure: CREATION OF LEFT ARM BRACHIALCEPHALIC  ARTERIOVENOUS (AV) FISTULA;  Surgeon: Rosetta Posner, MD;  Location: [facility];  Service: Vascular;  Laterality: Left;  . CESAREAN SECTION     x2  . FISTULA SUPERFICIALIZATION Left 10/28/2017   Procedure: SUPERFICIALIZATION LEFT BRACHIOCEPHALIC ARTERIOVENOUS FISTULA;  Surgeon: Rosetta Posner, MD;  Location: [facility];  Service: Vascular;  Laterality: Left;  . INSERTION OF DIALYSIS CATHETER Right 06/24/2017   Procedure: INSERTION OF TUNNELED DIALYSIS CATHETER;  Surgeon: Rosetta Posner, MD;  Location: [facility];  Service: Vascular;  Laterality: Right;  . LIGATION OF COMPETING BRANCHES OF ARTERIOVENOUS FISTULA  10/28/2017   Procedure: LIGATION OF COMPETING BRANCHES OF ARTERIOVENOUS FISTULA x3;  Surgeon: Rosetta Posner, MD;  [facility];  Service: Vascular;;     Current Outpatient Medications  Medication Sig Dispense Refill  . amLODipine (NORVASC) 10 MG tablet Take 10  mg by mouth at bedtime.    . calcitRIOL (ROCALTROL) 0.25 MCG capsule Take 1 capsule (0.25 mcg total) by mouth Every Tuesday,Thursday,and Saturday with dialysis. 30 capsule 0  . carvedilol (COREG) 25 MG tablet Take 1 tablet (25 mg total) by mouth 2 (two) times daily with a meal. 60 tablet 0  . hydrALAZINE (APRESOLINE) 25 MG tablet Take 1 tablet (25 mg total) by mouth every 8 (eight) hours. 90 tablet 0  . multivitamin (RENA-VIT) TABS tablet Take 1 tablet by mouth at bedtime. 30 tablet 0  . spironolactone (ALDACTONE) 50 MG tablet Take 50 mg by mouth daily.     No current facility-administered medications for this visit.     Allergies:   Visine [tetrahydrozoline hcl]    Social History:  The patient  reports that she has been smoking cigarettes. She has a 3.75 pack-year smoking history. She has never used smokeless tobacco. She reports current alcohol use. She reports current drug use. Drug: Marijuana.   Family History:  The patient's family history includes Cancer in her father; Diabetes in her father; Heart disease in her father; Hypertension in her father.    ROS:  Please see the history of present illness.   Otherwise, review of systems are positive for none.   All other systems are reviewed and negative.    PHYSICAL EXAM: VS:  There were no vitals taken for this visit. , BMI There is no height or weight on file to calculate  BMI. Affect appropriate Chronically ill black female  HEENT: normal Neck supple with no adenopathy JVP normal no bruits no thyromegaly Lungs clear with no wheezing and good diaphragmatic motion Heart:  S1/S2 no murmur, no rub, gallop or click PMI normal Abdomen: benighn, BS positve, no tenderness, no AAA no bruit.  No HSM or HJR Distal pulses intact with no bruits No edema Neuro non-focal Skin warm and dry No muscular weakness LUE fistual with thrill     EKG:  07/12/18 SR LAE LVH low voltage    Recent Labs: 07/11/2018: ALT 16 07/13/2018: BUN 44;  Creatinine, Ser 5.73; Hemoglobin 10.5; Magnesium 2.3; Platelets 65; Potassium 3.8; Sodium 137    Lipid Panel No results found for: CHOL, TRIG, HDL, CHOLHDL, VLDL, LDLCALC, LDLDIRECT    Wt Readings from Last 3 Encounters:  07/13/18 51.2 kg  07/09/18 55 kg  12/08/17 65.8 kg      Other studies Reviewed: Additional studies/ records that were reviewed today include: Notes from hospitalization January 2020 [facility] consult Note labs CTA, CXR and TTE .    ASSESSMENT AND PLAN:  1.  Pericardial effusion: likely related to uremia. *** 2.  HTN:  Well controlled.  Continue current medications and low sodium Dash type diet.   3  CRF:  Continue dialysis has had issues with fistula f/u Dr Donnetta Hutching 4. Pulmonary HTN:  F/u pulmonary needs sleep study should be improved with lower volume    Current medicines are reviewed at length with the patient today.  The patient does not have concerns regarding medicines.  The following changes have been made:  no change  Labs/ tests ordered today include: TTE  No orders of the defined types were placed in this encounter.    Disposition:   FU with cardiology in 6 months      Signed, Jenkins Rouge, MD  08/10/2018 8:49 AM    [address], [place], [place]  [address] Phone: [phone]; Fax: [phone]

## 2018-08-13 ENCOUNTER — Ambulatory Visit: Payer: Medicare Other | Admitting: Cardiovascular Disease

## 2018-08-13 NOTE — Progress Notes (Signed)
Cardiology Office Note   Date:  08/18/2018   ID:  Madeline Brown, DOB 1975-10-04, MRN [id number]  PCP:  Patient, No Pcp Per  Cardiologist:   Jenkins Rouge, MD   No chief complaint on file.     History of Present Illness: Madeline Brown is a 43 y.o. female who presents for post hospital f/u pericardial effusion. Last seen by me in August 2018 for elevated troponin in setting of CRF and anemia. Seen 07/12/18 by Dr Debara Pickett. Patient ESRD now on dialysis , Smoker , Low Platelets Has been on minoxidil for HTN and TTE with small to moderate pericardial effusion.   TTE 07/12/18 EF 60-65% severe LVH Estimated PA pressure 54 mmHg  Small to moderate circumferential effusion no tamponade  She had high oxygen requirements CTA negative for PE  To f/u Dr Nelda Marseille auto immune studies negative Needs sleep study  She continues to have issues with arranging transportation to dialysis  She sees [facility] for her access which seems very large in LUE     Past Medical History:  Diagnosis Date  . Chronic kidney disease   . Hypertension     Past Surgical History:  Procedure Laterality Date  . AV FISTULA PLACEMENT Left 06/24/2017   Procedure: CREATION OF LEFT ARM BRACHIALCEPHALIC  ARTERIOVENOUS (AV) FISTULA;  Surgeon: Rosetta Posner, MD;  Location: [place];  Service: Vascular;  Laterality: Left;  . CESAREAN SECTION     x2  . FISTULA SUPERFICIALIZATION Left 10/28/2017   Procedure: SUPERFICIALIZATION LEFT BRACHIOCEPHALIC ARTERIOVENOUS FISTULA;  Surgeon: Rosetta Posner, MD;  Location: [place];  Service: Vascular;  Laterality: Left;  . INSERTION OF DIALYSIS CATHETER Right 06/24/2017   Procedure: INSERTION OF TUNNELED DIALYSIS CATHETER;  Surgeon: Rosetta Posner, MD;  Location: [place];  Service: Vascular;  Laterality: Right;  . LIGATION OF COMPETING BRANCHES OF ARTERIOVENOUS FISTULA  10/28/2017   Procedure: LIGATION OF COMPETING BRANCHES OF ARTERIOVENOUS FISTULA x3;  Surgeon: Rosetta Posner, MD;  [facility];   Service: Vascular;;     Current Outpatient Medications  Medication Sig Dispense Refill  . amLODipine (NORVASC) 10 MG tablet Take 10 mg by mouth at bedtime.    . calcitRIOL (ROCALTROL) 0.25 MCG capsule Take 1 capsule (0.25 mcg total) by mouth Every Tuesday,Thursday,and Saturday with dialysis. 30 capsule 0  . carvedilol (COREG) 25 MG tablet Take 1 tablet (25 mg total) by mouth 2 (two) times daily with a meal. 60 tablet 0  . hydrALAZINE (APRESOLINE) 25 MG tablet Take 1 tablet (25 mg total) by mouth every 8 (eight) hours. 90 tablet 0  . multivitamin (RENA-VIT) TABS tablet Take 1 tablet by mouth at bedtime. 30 tablet 0  . spironolactone (ALDACTONE) 50 MG tablet Take 50 mg by mouth daily.     No current facility-administered medications for this visit.     Allergies:   Visine [tetrahydrozoline hcl]    Social History:  The patient  reports that she has been smoking cigarettes. She has a 3.75 pack-year smoking history. She has never used smokeless tobacco. She reports current alcohol use. She reports current drug use. Drug: Marijuana.   Family History:  The patient's family history includes Cancer in her father; Diabetes in her father; Heart disease in her father; Hypertension in her father.    ROS:  Please see the history of present illness.   Otherwise, review of systems are positive for none.   All other systems are reviewed and negative.    PHYSICAL  EXAM: VS:  BP 130/88   Pulse 71   Ht 5' (1.524 m)   Wt 56.2 kg   SpO2 97%   BMI 24.18 kg/m  , BMI Body mass index is 24.18 kg/m. Affect appropriate Chronically ill black female  HEENT: normal Neck supple with no adenopathy JVP normal no bruits no thyromegaly Lungs clear with no wheezing and good diaphragmatic motion Heart:  S1/S2 continuous shunt murmur, no rub, gallop or click PMI normal Abdomen: benighn, BS positve, no tenderness, no AAA no bruit.  No HSM or HJR Distal pulses intact with no bruits No edema Neuro  non-focal Skin warm and dry No muscular weakness LUE fistual with thrill     EKG:  07/12/18 SR LAE LVH low voltage    Recent Labs: 07/11/2018: ALT 16 07/13/2018: BUN 44; Creatinine, Ser 5.73; Hemoglobin 10.5; Magnesium 2.3; Platelets 65; Potassium 3.8; Sodium 137    Lipid Panel No results found for: CHOL, TRIG, HDL, CHOLHDL, VLDL, LDLCALC, LDLDIRECT    Wt Readings from Last 3 Encounters:  08/18/18 56.2 kg  07/13/18 51.2 kg  07/09/18 55 kg      Other studies Reviewed: Additional studies/ records that were reviewed today include: Notes from hospitalization January 2020 Dr [facility] consult Note labs CTA, CXR and TTE .    ASSESSMENT AND PLAN:  1.  Pericardial effusion: likely related to uremia. F/u echo ordered  2.  HTN:  Well controlled.  Continue current medications and  low sodium Dash type diet.   3  CRF:  Continue dialysis has had issues with fistula Seems too large to me with large shunt Causing continuous murmur in heart f/ [facility]  4. Pulmonary HTN:  F/u pulmonary needs sleep study should be improved with lower volume    Current medicines are reviewed at length with the patient today.  The patient does not have concerns regarding medicines.  The following changes have been made:  no change  Labs/ tests ordered today include: TTE   Orders Placed This Encounter  Procedures  . ECHOCARDIOGRAM COMPLETE     Disposition:   FU with cardiology in 6 months      Signed, Jenkins Rouge, MD  08/18/2018 8:50 AM    [facility] [address], [place], [place]  [address] Phone: [phone]; Fax: [phone]

## 2018-08-18 ENCOUNTER — Ambulatory Visit (INDEPENDENT_AMBULATORY_CARE_PROVIDER_SITE_OTHER): Payer: Medicare Other | Admitting: Cardiovascular Disease

## 2018-08-18 ENCOUNTER — Encounter: Payer: Self-pay | Admitting: Cardiovascular Disease

## 2018-08-18 VITALS — BP 130/88 | HR 71 | Ht 60.0 in | Wt 123.8 lb

## 2018-08-18 DIAGNOSIS — I10 Essential (primary) hypertension: Secondary | ICD-10-CM | POA: Diagnosis not present

## 2018-08-18 DIAGNOSIS — I3139 Other pericardial effusion (noninflammatory): Secondary | ICD-10-CM

## 2018-08-18 DIAGNOSIS — I313 Pericardial effusion (noninflammatory): Secondary | ICD-10-CM

## 2018-08-18 NOTE — Patient Instructions (Addendum)
Medication Instructions:   If you need a refill on your cardiac medications before your next appointment, please call your pharmacy.   Lab work:  If you have labs (blood work) drawn today and your tests are completely normal, you will receive your results only by: Marland Kitchen MyChart Message (if you have MyChart) OR . A paper copy in the mail If you have any lab test that is abnormal or we need to change your treatment, we will call you to review the results.  Testing/Procedures: Your physician has requested that you have an echocardiogram as soon as possible. Echocardiography is a painless test that uses sound waves to create images of your heart. It provides your doctor with information about the size and shape of your heart and how well your heart's chambers and valves are working. This procedure takes approximately one hour. There are no restrictions for this procedure.  Follow-Up: At [facility], you and your health needs are our priority.  As part of our continuing mission to provide you with exceptional heart care, we have created designated Provider Care Teams.  These Care Teams include your primary Cardiologist (physician) and Advanced Practice Providers (APPs -  Physician Assistants and Nurse Practitioners) who all work together to provide you with the care you need, when you need it. You will need a follow up appointment in 6 months.  Please call our office 2 months in advance to schedule this appointment.  You may see Jenkins Rouge, MD or one of the following Advanced Practice Providers on your designated Care Team:   Truitt Merle, NP Cecilie Kicks, NP . Kathyrn Drown, NP

## 2018-08-23 DIAGNOSIS — R52 Pain, unspecified: Secondary | ICD-10-CM | POA: Insufficient documentation

## 2018-08-30 ENCOUNTER — Other Ambulatory Visit (HOSPITAL_COMMUNITY): Payer: Medicare Other

## 2018-09-03 ENCOUNTER — Telehealth: Payer: Self-pay | Admitting: Cardiology

## 2018-09-03 ENCOUNTER — Ambulatory Visit (HOSPITAL_COMMUNITY): Payer: Medicare Other

## 2018-09-03 NOTE — Telephone Encounter (Signed)
   Echocardiogram postponed 6 to 12 weeks, level 2. Dr. Johnsie Cancel -small to moderate pericardial effusion, end-stage renal disease on hemodialysis.  Spoke to patient, she feels fine, no shortness of breath no chest pain no fevers.  She has not experienced any syncopal episodes.  Her blood pressure at last visit was in the 130 range.  Excellent.  I think it is reasonable to postpone this echocardiogram for 6 to 12 weeks given her overall stability at this point.  Likely uremic effusion as Dr. Johnsie Cancel stated.  She is not having any symptoms of cardiac tamponade.  Told her to contact us if she has any further worrisome symptoms.  She appreciated call understands in light of the coronavirus to limit exposure.  Candee Furbish, MD

## 2018-09-21 IMAGING — NM NM HEPATO W/GB/PHARM/[PERSON_NAME]
3 series · 13 of 13 positions shown · non-contrast
Comparison: Ultrasound 02/13/2017, CT 02/12/2017

CLINICAL DATA: Mid abdominal pain with nausea

EXAM:
NUCLEAR MEDICINE HEPATOBILIARY IMAGING WITH GALLBLADDER EF
TECHNIQUE: Sequential images of the abdomen were obtained [DATE] minutes
following intravenous administration of radiopharmaceutical. After
oral ingestion of Ensure, gallbladder ejection fraction was
determined. At 60 min, normal ejection fraction is greater than 33%.
RADIOPHARMACEUTICALS:  5.3 mCi 1c-JJm  Choletec IV

[he hepatobiliary · 3.43mm/px · 6 of 60 frames shown (1 of 3)]
[frame 6/60]
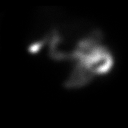
[frame 16/60]
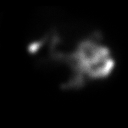
[frame 26/60]
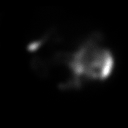
[frame 36/60]
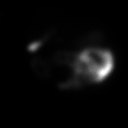
[frame 46/60]
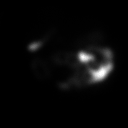
[frame 56/60]
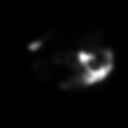

[he hepatobiliary · 2.51mm/px · 1 of 1 slices shown (2 of 3)]
[im 1/1]
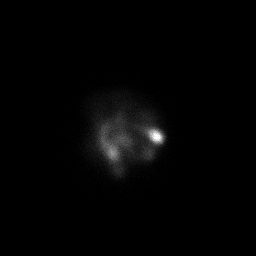

[he hepatobiliary · 3.43mm/px · 6 of 60 frames shown (3 of 3)]
[frame 6/60]
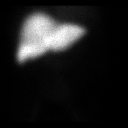
[frame 16/60]
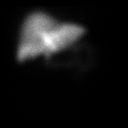
[frame 26/60]
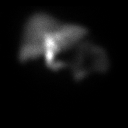
[frame 36/60]
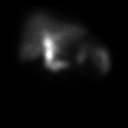
[frame 46/60]
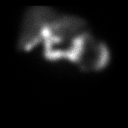
[frame 56/60]
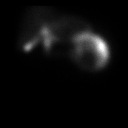

[13 of 13 positions shown; findings below may reference images not displayed]

FINDINGS: Prompt uptake and biliary excretion of activity by the liver is
seen. Gallbladder activity is visualized, consistent with patency of
cystic duct. Biliary activity passes into small bowel, consistent
with patent common bile duct.

Calculated gallbladder ejection fraction is 22%. (Normal gallbladder
ejection fraction with Ensure is greater than 33%.)
IMPRESSION: 1. Negative for acute cholecystitis. Patent cystic duct. Normal
excretion/ transit of radiotracer into the bowel.
2. Decreased gallbladder ejection fraction of 22% suggesting
dyskinesia

## 2018-10-18 IMAGING — US US ABDOMEN LIMITED
1 series · 14 of 25 positions shown · non-contrast
Comparison: CT 02/12/2017

CLINICAL DATA: Epigastric pain tonight.

EXAM:
ULTRASOUND ABDOMEN LIMITED RIGHT UPPER QUADRANT

[Series 1: us abdomen limited · 0.19mm/px · 14 of 54 slices shown]
[im 1/54]
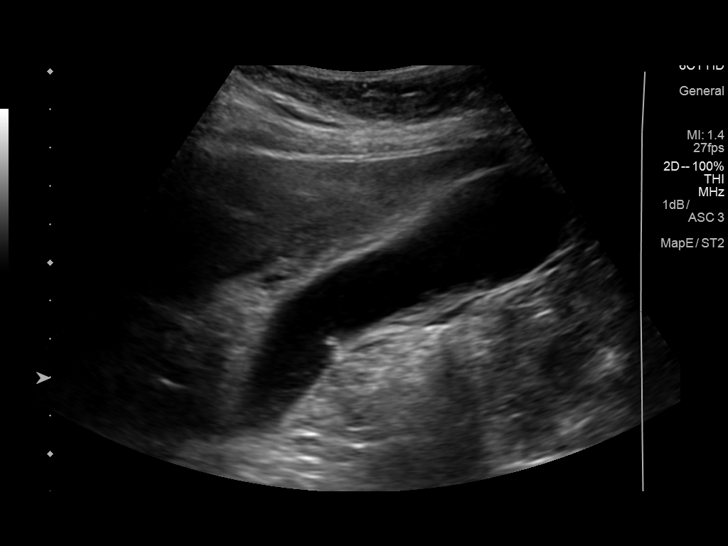
[im 5/54]
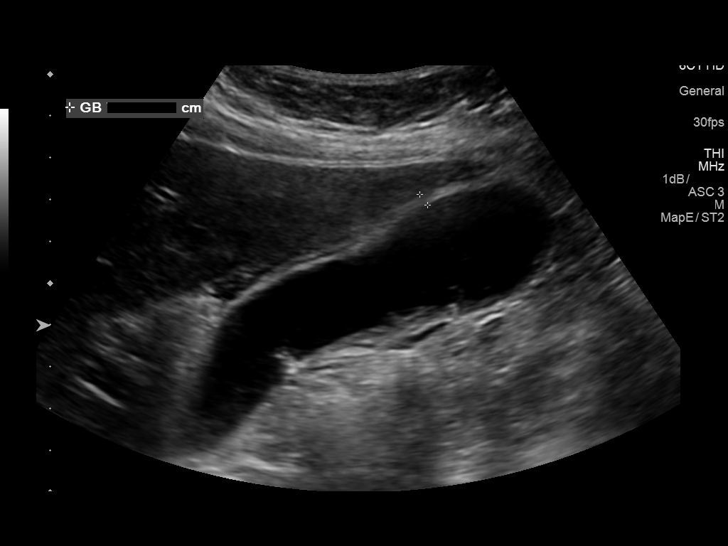
[im 9/54]
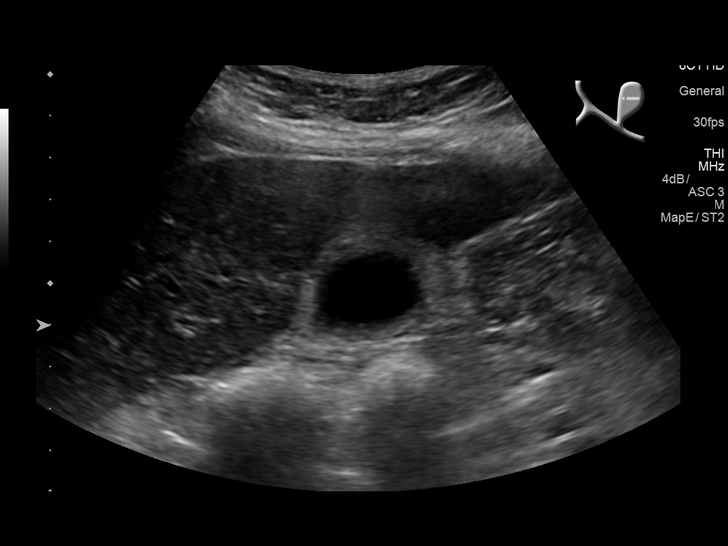
[im 14/54]
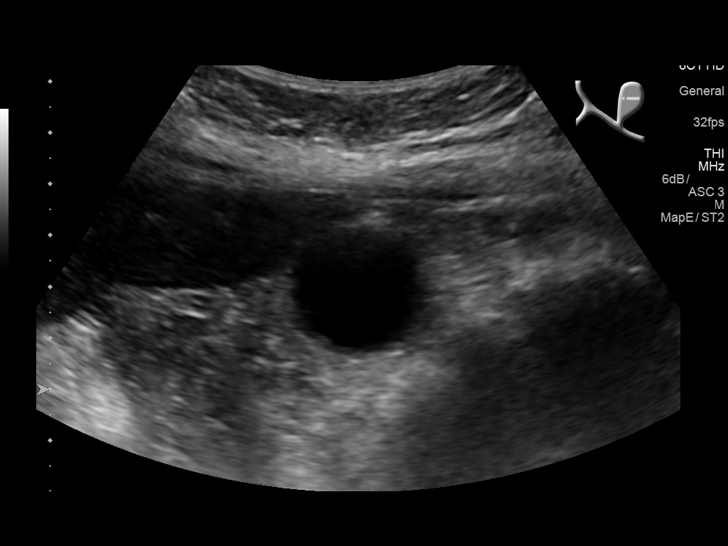
[im 18/54]
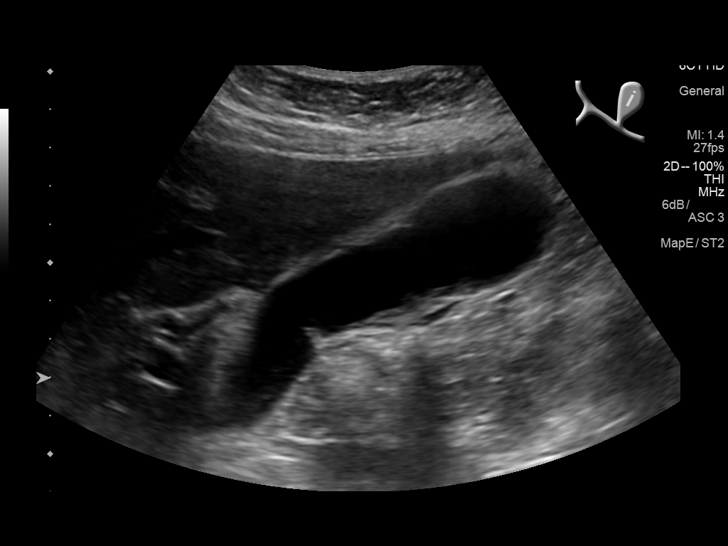
[im 20/54]
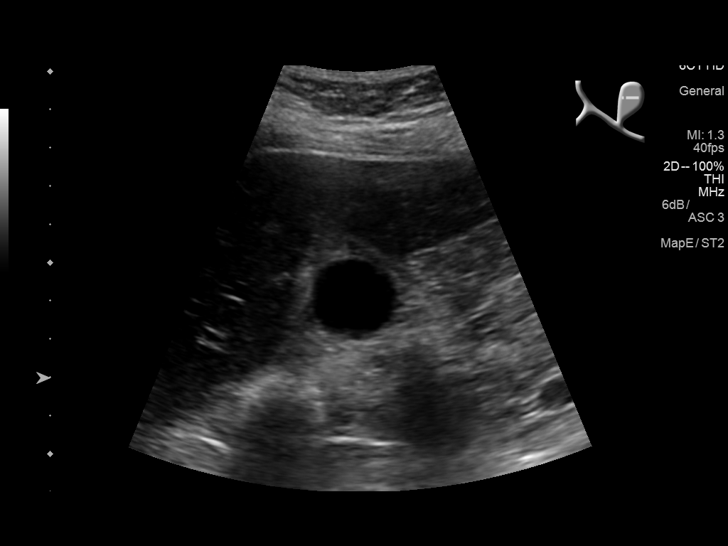
[im 25/54]
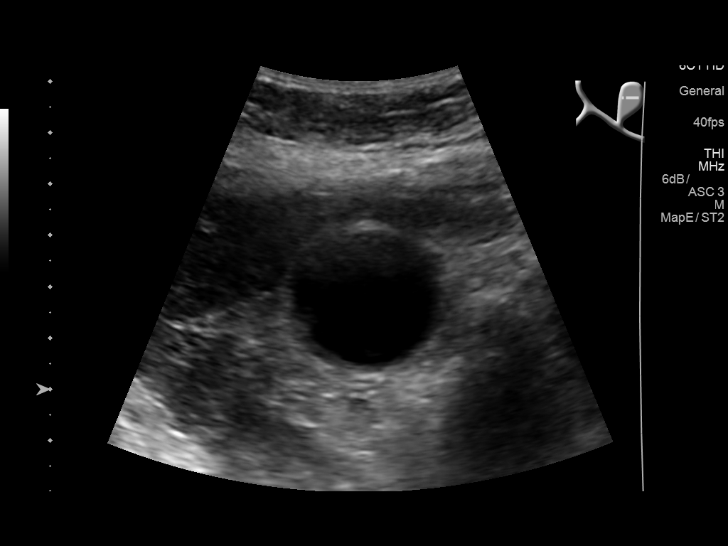
[im 29/54]
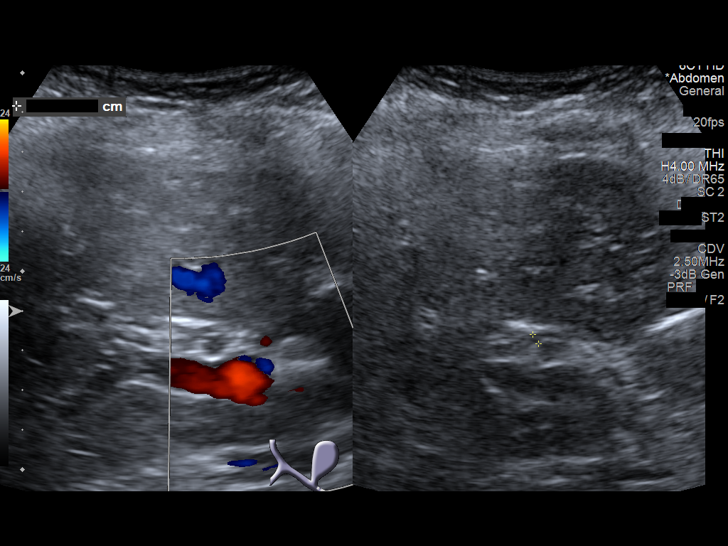
[im 34/54]
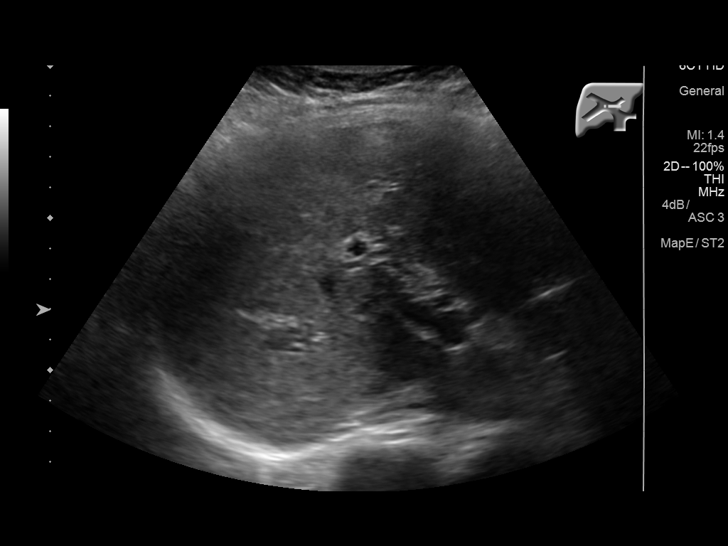
[im 36/54]
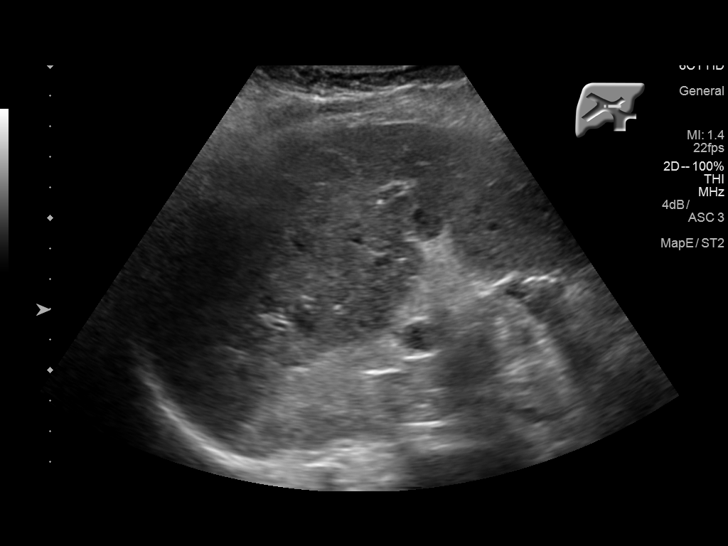
[im 40/54]
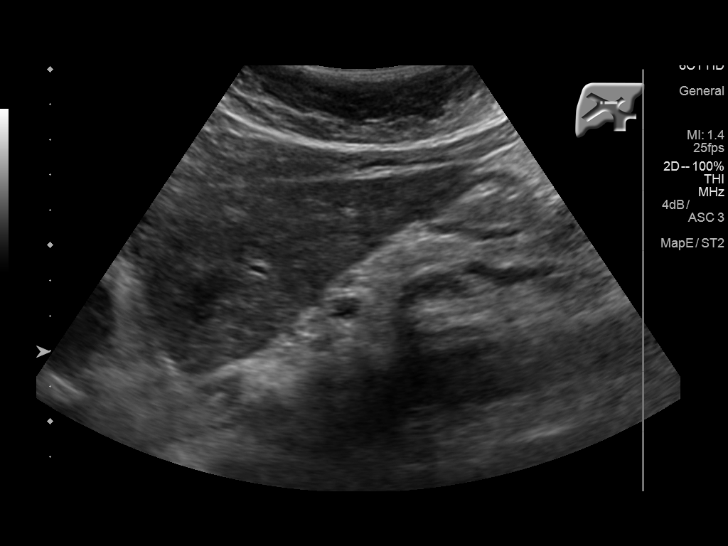
[im 45/54]
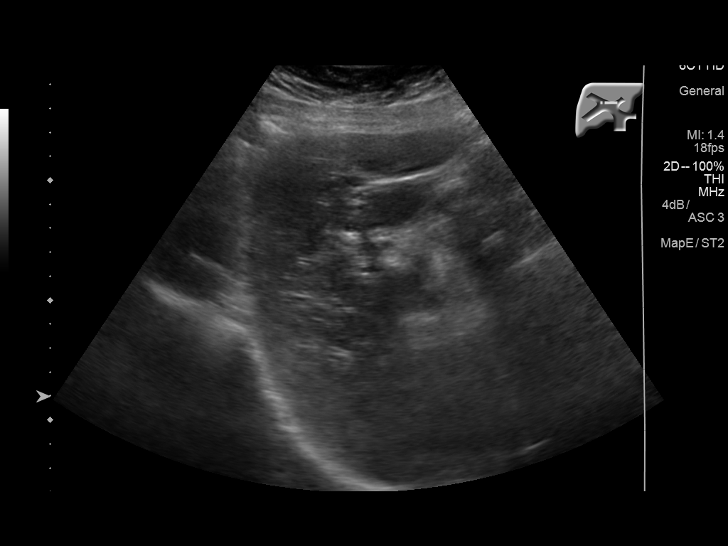
[im 49/54]
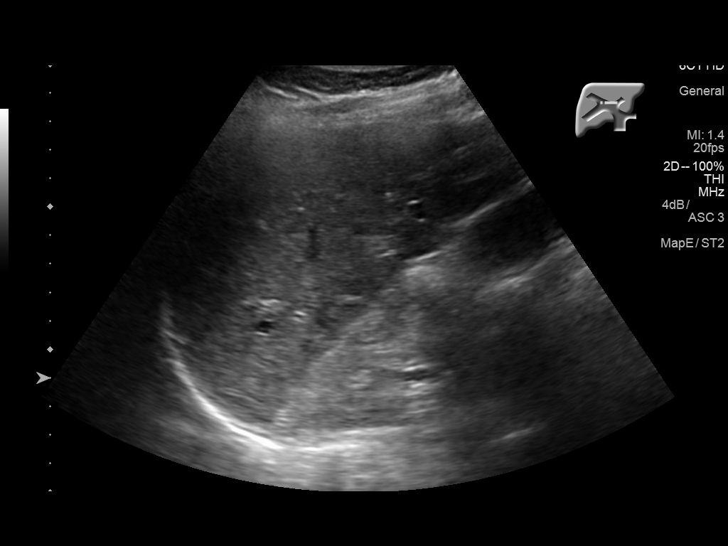
[im 54/54]
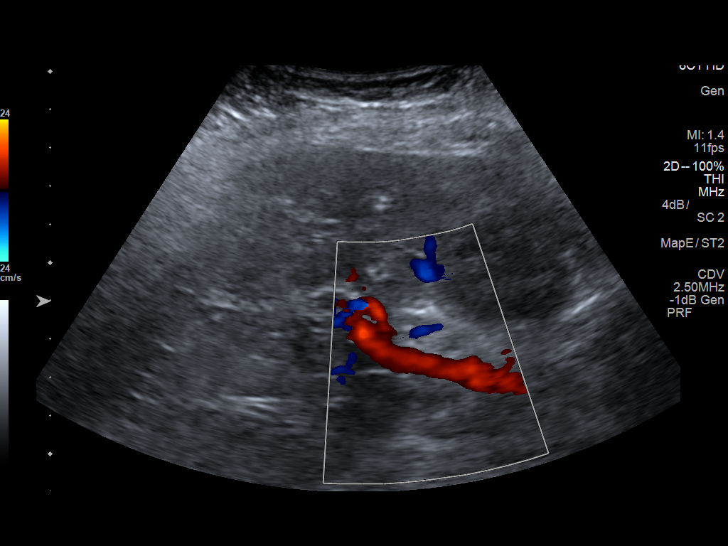

[14 of 25 positions shown; findings below may reference images not displayed]

FINDINGS: Gallbladder:

Gallbladder sludge. Mild mural thickening. No pericholecystic fluid.
No tenderness to probe pressure over the gallbladder.

Common bile duct:

Diameter: Normal, 2.7 mm.

Liver:

Generalized coarsening of hepatic echotexture without focal lesion.
Portal vein is patent on color Doppler imaging with normal direction
of blood flow towards the liver.
IMPRESSION: 1. Gallbladder sludge. No visible calculi. Normal bile ducts.
Negative sonographic Murphy sign.
2. Coarsened hepatic parenchymal echotexture could represent fatty
infiltration or hepatocellular disease.

## 2018-10-20 ENCOUNTER — Telehealth: Payer: Self-pay | Admitting: Cardiovascular Disease

## 2018-10-20 NOTE — Telephone Encounter (Signed)
New Message         Transportation is calling to confirm patient's appointment, spoke with [place]. Just and FYI

## 2018-10-21 ENCOUNTER — Telehealth: Payer: Self-pay

## 2018-10-21 NOTE — Telephone Encounter (Signed)
Madeline Brown at the [facility] called and said that the patient needs an appt. 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   Madeline Brown and she states that there is no issue with patient having access to dialysis and there are no concerns with her having her dialysis done as the issue is not interfering with this.   Appt made for pt to be seen to have the aneurysms looked at. No test ordered given there were no access issues.   York Cerise, CMA

## 2018-10-22 ENCOUNTER — Telehealth: Payer: Self-pay | Admitting: Cardiovascular Disease

## 2018-10-22 ENCOUNTER — Ambulatory Visit (HOSPITAL_COMMUNITY): Payer: Medicare Other

## 2018-10-22 NOTE — Telephone Encounter (Signed)
New Message    Pt is scheduled for an echo today and is wondering if her husband to come to assist her because she is not feeling well.      Please call

## 2018-10-22 NOTE — Telephone Encounter (Signed)
Returned pts call and advised her that her spouse couldn't come up with her to her echo this morning. Pt wanted to see if he could wait downstairs in the lobby, she was advised that he may need to wait outside, but they could screen him and make sure he didn't have any symptoms, and possibly wait down stairs in the lobby.

## 2018-10-26 ENCOUNTER — Ambulatory Visit: Payer: Medicare Other | Admitting: Vascular Surgery

## 2018-10-26 ENCOUNTER — Encounter: Payer: Self-pay | Admitting: Family

## 2018-11-10 ENCOUNTER — Telehealth (HOSPITAL_COMMUNITY): Payer: Self-pay | Admitting: Cardiology

## 2018-11-10 ENCOUNTER — Other Ambulatory Visit (HOSPITAL_COMMUNITY): Payer: Medicare Other

## 2018-11-10 NOTE — Telephone Encounter (Signed)

## 2018-11-11 ENCOUNTER — Ambulatory Visit (HOSPITAL_COMMUNITY): Payer: Medicare Other | Attending: Cardiovascular Disease

## 2018-11-11 ENCOUNTER — Other Ambulatory Visit: Payer: Self-pay

## 2018-11-11 DIAGNOSIS — I3139 Other pericardial effusion (noninflammatory): Secondary | ICD-10-CM

## 2018-11-11 DIAGNOSIS — I313 Pericardial effusion (noninflammatory): Secondary | ICD-10-CM | POA: Diagnosis present

## 2018-11-12 NOTE — Telephone Encounter (Signed)
Patient aware of results.

## 2018-11-12 NOTE — Telephone Encounter (Signed)
F/U Message            Patient is calling back she states she was disconnected while on the phone with Pam, she would like a call back to continue getting her results.

## 2018-11-29 ENCOUNTER — Telehealth: Payer: Self-pay | Admitting: Obstetrics and Gynecology

## 2018-11-29 NOTE — Telephone Encounter (Signed)
Attempted to call patient to ask her about any symptoms, and to wear her mask the whole visit covering her nose and mouth. Also, to sanitize her hands upon arriving. Phone was not working.

## 2018-11-30 ENCOUNTER — Encounter: Payer: Medicare Other | Admitting: Obstetrics and Gynecology

## 2019-02-03 NOTE — Progress Notes (Deleted)
Cardiology Office Note   Date:  02/03/2019   ID:  Madeline Brown, DOB 07-01-1975, MRN [id number]  PCP:  Patient, No Pcp Per  Cardiologist:   Jenkins Rouge, MD   No chief complaint on file.     History of Present Illness: Madeline Brown is a 43 y.o. female who presents for post hospital f/u pericardial effusion. Seen by me in August 2018 for elevated troponin in setting of CRF and anemia. Seen 07/12/18 by Dr Debara Pickett. Patient ESRD now on dialysis , Smoker , Low Platelets Has been on minoxidil for HTN and TTE with small to moderate pericardial effusion.   TTE 07/12/18 EF 60-65% severe LVH Estimated PA pressure 54 mmHg  Small to moderate circumferential effusion no tamponade  She had high oxygen requirements CTA negative for PE  To f/u Dr Nelda Marseille auto immune studies negative Needs sleep study  She continues to have issues with arranging transportation to dialysis  She sees [facility] for her access which seems very large in LUE  F/U echo 11/11/18 EF >65% mild MR estimated PA pressure 40 mmHg effusion resolved   ***   Past Medical History:  Diagnosis Date  . Chronic kidney disease   . Hypertension     Past Surgical History:  Procedure Laterality Date  . AV FISTULA PLACEMENT Left 06/24/2017   Procedure: CREATION OF LEFT ARM BRACHIALCEPHALIC  ARTERIOVENOUS (AV) FISTULA;  Surgeon: Rosetta Posner, MD;  Location: [place];  Service: Vascular;  Laterality: Left;  . CESAREAN SECTION     x2  . FISTULA SUPERFICIALIZATION Left 10/28/2017   Procedure: SUPERFICIALIZATION LEFT BRACHIOCEPHALIC ARTERIOVENOUS FISTULA;  Surgeon: Rosetta Posner, MD;  Location: [place];  Service: Vascular;  Laterality: Left;  . INSERTION OF DIALYSIS CATHETER Right 06/24/2017   Procedure: INSERTION OF TUNNELED DIALYSIS CATHETER;  Surgeon: Rosetta Posner, MD;  Location: [place];  Service: Vascular;  Laterality: Right;  . LIGATION OF COMPETING BRANCHES OF ARTERIOVENOUS FISTULA  10/28/2017   Procedure: LIGATION OF COMPETING  BRANCHES OF ARTERIOVENOUS FISTULA x3;  Surgeon: Rosetta Posner, MD;  [facility];  Service: Vascular;;     Current Outpatient Medications  Medication Sig Dispense Refill  . amLODipine (NORVASC) 10 MG tablet Take 10 mg by mouth at bedtime.    . calcitRIOL (ROCALTROL) 0.25 MCG capsule Take 1 capsule (0.25 mcg total) by mouth Every Tuesday,Thursday,and Saturday with dialysis. 30 capsule 0  . carvedilol (COREG) 25 MG tablet Take 1 tablet (25 mg total) by mouth 2 (two) times daily with a meal. 60 tablet 0  . hydrALAZINE (APRESOLINE) 25 MG tablet Take 1 tablet (25 mg total) by mouth every 8 (eight) hours. 90 tablet 0  . multivitamin (RENA-VIT) TABS tablet Take 1 tablet by mouth at bedtime. 30 tablet 0  . spironolactone (ALDACTONE) 50 MG tablet Take 50 mg by mouth daily.     No current facility-administered medications for this visit.     Allergies:   Visine [tetrahydrozoline hcl]    Social History:  The patient  reports that she has been smoking cigarettes. She has a 3.75 pack-year smoking history. She has never used smokeless tobacco. She reports current alcohol use. She reports current drug use. Drug: Marijuana.   Family History:  The patient's family history includes Cancer in her father; Diabetes in her father; Heart disease in her father; Hypertension in her father.    ROS:  Please see the history of present illness.   Otherwise, review of systems are positive  for none.   All other systems are reviewed and negative.    PHYSICAL EXAM: VS:  There were no vitals taken for this visit. , BMI There is no height or weight on file to calculate BMI. Affect appropriate Chronically ill black female  HEENT: normal Neck supple with no adenopathy JVP normal no bruits no thyromegaly Lungs clear with no wheezing and good diaphragmatic motion Heart:  S1/S2 continuous shunt murmur, no rub, gallop or click PMI normal Abdomen: benighn, BS positve, no tenderness, no AAA no bruit.  No HSM or HJR  Distal pulses intact with no bruits No edema Neuro non-focal Skin warm and dry No muscular weakness LUE fistual with thrill     EKG:  07/12/18 SR LAE LVH low voltage    Recent Labs: 07/11/2018: ALT 16 07/13/2018: BUN 44; Creatinine, Ser 5.73; Hemoglobin 10.5; Magnesium 2.3; Platelets 65; Potassium 3.8; Sodium 137    Lipid Panel No results found for: CHOL, TRIG, HDL, CHOLHDL, VLDL, LDLCALC, LDLDIRECT    Wt Readings from Last 3 Encounters:  08/18/18 123 lb 12.8 oz (56.2 kg)  07/13/18 112 lb 14 oz (51.2 kg)  07/09/18 121 lb 4.1 oz (55 kg)      Other studies Reviewed: Additional studies/ records that were reviewed today include: Notes from hospitalization January 2020 [facility] consult Note labs CTA, CXR and TTE .    ASSESSMENT AND PLAN:  1.  Pericardial effusion: likely related to uremia.  Resolved on most recent echo 11/11/18 2.  HTN:  Well controlled.  Continue current medications and  low sodium Dash type diet.   3  CRF:  Continue dialysis has had issues with fistula Seems too large to me with large shunt Causing continuous murmur in heart f/ [facility]  4. Pulmonary HTN:  F/u pulmonary needs sleep study should be improved with lower volume    Current medicines are reviewed at length with the patient today.  The patient does not have concerns regarding medicines.  The following changes have been made:  no change  Labs/ tests ordered today include: None   No orders of the defined types were placed in this encounter.    Disposition:   FU with cardiology  PRN     Signed, Jenkins Rouge, MD  02/03/2019 4:23 PM    [facility] [address], [place], [place]  [address] Phone: [phone]; Fax: [phone]

## 2019-02-15 DIAGNOSIS — R569 Unspecified convulsions: Secondary | ICD-10-CM

## 2019-02-15 HISTORY — DX: Unspecified convulsions: R56.9

## 2019-02-16 ENCOUNTER — Ambulatory Visit: Payer: Medicare Other | Admitting: Cardiovascular Disease

## 2019-02-19 ENCOUNTER — Emergency Department (HOSPITAL_COMMUNITY): Payer: Medicare Other

## 2019-02-19 ENCOUNTER — Inpatient Hospital Stay (HOSPITAL_COMMUNITY)
Admission: EM | Admit: 2019-02-19 | Discharge: 2019-02-21 | DRG: 100 | Disposition: A | Discharge status: Home / Self Care | Payer: Medicare Other | Attending: Student in an Organized Health Care Education/Training Program | Admitting: Student in an Organized Health Care Education/Training Program

## 2019-02-19 DIAGNOSIS — R402362 Coma scale, best motor response, obeys commands, at arrival to emergency department: Secondary | ICD-10-CM | POA: Diagnosis present

## 2019-02-19 DIAGNOSIS — Z809 Family history of malignant neoplasm, unspecified: Secondary | ICD-10-CM

## 2019-02-19 DIAGNOSIS — N2581 Secondary hyperparathyroidism of renal origin: Secondary | ICD-10-CM | POA: Diagnosis present

## 2019-02-19 DIAGNOSIS — G936 Cerebral edema: Secondary | ICD-10-CM | POA: Diagnosis present

## 2019-02-19 DIAGNOSIS — I10 Essential (primary) hypertension: Secondary | ICD-10-CM | POA: Diagnosis not present

## 2019-02-19 DIAGNOSIS — Z20828 Contact with and (suspected) exposure to other viral communicable diseases: Secondary | ICD-10-CM | POA: Diagnosis present

## 2019-02-19 DIAGNOSIS — D631 Anemia in chronic kidney disease: Secondary | ICD-10-CM | POA: Diagnosis present

## 2019-02-19 DIAGNOSIS — Z888 Allergy status to other drugs, medicaments and biological substances status: Secondary | ICD-10-CM

## 2019-02-19 DIAGNOSIS — R4701 Aphasia: Secondary | ICD-10-CM | POA: Diagnosis present

## 2019-02-19 DIAGNOSIS — G40909 Epilepsy, unspecified, not intractable, without status epilepticus: Principal | ICD-10-CM

## 2019-02-19 DIAGNOSIS — R569 Unspecified convulsions: Secondary | ICD-10-CM

## 2019-02-19 DIAGNOSIS — I120 Hypertensive chronic kidney disease with stage 5 chronic kidney disease or end stage renal disease: Secondary | ICD-10-CM | POA: Diagnosis present

## 2019-02-19 DIAGNOSIS — I611 Nontraumatic intracerebral hemorrhage in hemisphere, cortical: Secondary | ICD-10-CM | POA: Diagnosis present

## 2019-02-19 DIAGNOSIS — E785 Hyperlipidemia, unspecified: Secondary | ICD-10-CM | POA: Diagnosis present

## 2019-02-19 DIAGNOSIS — F17200 Nicotine dependence, unspecified, uncomplicated: Secondary | ICD-10-CM | POA: Diagnosis not present

## 2019-02-19 DIAGNOSIS — R402112 Coma scale, eyes open, never, at arrival to emergency department: Secondary | ICD-10-CM | POA: Diagnosis present

## 2019-02-19 DIAGNOSIS — Z833 Family history of diabetes mellitus: Secondary | ICD-10-CM | POA: Diagnosis not present

## 2019-02-19 DIAGNOSIS — Z820 Family history of epilepsy and other diseases of the nervous system: Secondary | ICD-10-CM | POA: Diagnosis not present

## 2019-02-19 DIAGNOSIS — F17210 Nicotine dependence, cigarettes, uncomplicated: Secondary | ICD-10-CM | POA: Diagnosis present

## 2019-02-19 DIAGNOSIS — S06350A Traumatic hemorrhage of left cerebrum without loss of consciousness, initial encounter: Secondary | ICD-10-CM | POA: Diagnosis not present

## 2019-02-19 DIAGNOSIS — X58XXXA Exposure to other specified factors, initial encounter: Secondary | ICD-10-CM | POA: Diagnosis not present

## 2019-02-19 DIAGNOSIS — I619 Nontraumatic intracerebral hemorrhage, unspecified: Secondary | ICD-10-CM | POA: Insufficient documentation

## 2019-02-19 DIAGNOSIS — IMO0001 Reserved for inherently not codable concepts without codable children: Secondary | ICD-10-CM

## 2019-02-19 DIAGNOSIS — Z79899 Other long term (current) drug therapy: Secondary | ICD-10-CM | POA: Diagnosis not present

## 2019-02-19 DIAGNOSIS — N186 End stage renal disease: Secondary | ICD-10-CM | POA: Diagnosis present

## 2019-02-19 DIAGNOSIS — R402212 Coma scale, best verbal response, none, at arrival to emergency department: Secondary | ICD-10-CM | POA: Diagnosis present

## 2019-02-19 DIAGNOSIS — Z992 Dependence on renal dialysis: Secondary | ICD-10-CM | POA: Diagnosis present

## 2019-02-19 DIAGNOSIS — Z8249 Family history of ischemic heart disease and other diseases of the circulatory system: Secondary | ICD-10-CM | POA: Diagnosis not present

## 2019-02-19 DIAGNOSIS — S0689AA Other specified intracranial injury with loss of consciousness status unknown, initial encounter: Secondary | ICD-10-CM | POA: Diagnosis present

## 2019-02-19 HISTORY — DX: Anemia, unspecified: D64.9

## 2019-02-19 LAB — RENAL FUNCTION PANEL
Albumin: 3.8 g/dL (ref 3.5–5.0)
Anion gap: 14 (ref 5–15)
BUN: 16 mg/dL (ref 6–20)
CO2: 30 mmol/L (ref 22–32)
Calcium: 8.7 mg/dL — ABNORMAL LOW (ref 8.9–10.3)
Chloride: 92 mmol/L — ABNORMAL LOW (ref 98–111)
Creatinine, Ser: 3.77 mg/dL — ABNORMAL HIGH (ref 0.44–1.00)
GFR calc Af Amer: 16 mL/min — ABNORMAL LOW (ref >60)
GFR calc non Af Amer: 14 mL/min — ABNORMAL LOW (ref >60)
Glucose, Bld: 85 mg/dL (ref 70–99)
Phosphorus: 4.3 mg/dL (ref 2.5–4.6)
Potassium: 3.9 mmol/L (ref 3.5–5.1)
Sodium: 136 mmol/L (ref 135–145)

## 2019-02-19 LAB — CBC
HCT: 35.6 % — ABNORMAL LOW (ref 36.0–46.0)
Hemoglobin: 11.9 g/dL — ABNORMAL LOW (ref 12.0–15.0)
MCH: 33.3 pg (ref 26.0–34.0)
MCHC: 33.4 g/dL (ref 30.0–36.0)
MCV: 99.7 fL (ref 80.0–100.0)
Platelets: 126 10*3/uL — ABNORMAL LOW (ref 150–400)
RBC: 3.57 MIL/uL — ABNORMAL LOW (ref 3.87–5.11)
RDW: 13.9 % (ref 11.5–15.5)
WBC: 5.1 10*3/uL (ref 4.0–10.5)
nRBC: 0 % (ref 0.0–0.2)

## 2019-02-19 LAB — MAGNESIUM: Magnesium: 2.3 mg/dL (ref 1.7–2.4)

## 2019-02-19 LAB — URINALYSIS, ROUTINE W REFLEX MICROSCOPIC
Bilirubin Urine: NEGATIVE
Glucose, UA: NEGATIVE mg/dL
Hgb urine dipstick: NEGATIVE
Ketones, ur: NEGATIVE mg/dL
Leukocytes,Ua: NEGATIVE
Nitrite: NEGATIVE
Protein, ur: >=300 mg/dL — AB
Specific Gravity, Urine: 1.011 (ref 1.005–1.030)
pH: 9 — ABNORMAL HIGH (ref 5.0–8.0)

## 2019-02-19 LAB — SALICYLATE LEVEL: Salicylate Lvl: <7 mg/dL (ref 2.8–30.0)

## 2019-02-19 LAB — TROPONIN I (HIGH SENSITIVITY)
Troponin I (High Sensitivity): 46 ng/L — ABNORMAL HIGH (ref <18)
Troponin I (High Sensitivity): 89 ng/L — ABNORMAL HIGH (ref <18)

## 2019-02-19 LAB — RAPID URINE DRUG SCREEN, HOSP PERFORMED
Amphetamines: NOT DETECTED
Barbiturates: NOT DETECTED
Benzodiazepines: POSITIVE — AB
Cocaine: NOT DETECTED
Opiates: NOT DETECTED
Tetrahydrocannabinol: POSITIVE — AB

## 2019-02-19 LAB — SARS CORONAVIRUS 2 BY RT PCR (HOSPITAL ORDER, PERFORMED IN ~~LOC~~ HOSPITAL LAB): SARS Coronavirus 2: NEGATIVE

## 2019-02-19 LAB — LACTIC ACID, PLASMA
Lactic Acid, Venous: 3.3 mmol/L (ref 0.5–1.9)
Lactic Acid, Venous: 1 mmol/L (ref 0.5–1.9)

## 2019-02-19 MED ORDER — ACETAMINOPHEN 325 MG PO TABS
650.0000 mg | ORAL_TABLET | Freq: Four times a day (QID) | ORAL | Status: DC | PRN
  Administered 2019-02-20: 12:00:00 650 mg via ORAL
  Filled 2019-02-19 (×2): qty 2

## 2019-02-19 MED ORDER — ACETAMINOPHEN 650 MG RE SUPP: 650.0000 mg | Freq: Four times a day (QID) | RECTAL | Status: DC | PRN

## 2019-02-19 MED ORDER — SODIUM CHLORIDE 0.9 % IV SOLN
2000.0000 mg | Freq: Once | INTRAVENOUS | Status: AC
  Administered 2019-02-19: 2000 mg via INTRAVENOUS
  Filled 2019-02-19: qty 20

## 2019-02-19 MED ORDER — LEVETIRACETAM IN NACL 1000 MG/100ML IV SOLN
1000.0000 mg | INTRAVENOUS | Status: DC
  Administered 2019-02-20: 1000 mg via INTRAVENOUS
  Filled 2019-02-19: qty 100

## 2019-02-19 MED ORDER — HEPARIN SODIUM (PORCINE) 5000 UNIT/ML IJ SOLN
5000.0000 [IU] | Freq: Three times a day (TID) | INTRAMUSCULAR | Status: DC
  Administered 2019-02-19 – 2019-02-20 (×2): 5000 [IU] via SUBCUTANEOUS
  Filled 2019-02-19 (×2): qty 1

## 2019-02-19 MED ORDER — AMLODIPINE BESYLATE 10 MG PO TABS
10.0000 mg | ORAL_TABLET | Freq: Every day | ORAL | Status: DC
  Administered 2019-02-20 (×2): 10 mg via ORAL
  Filled 2019-02-19 (×2): qty 1

## 2019-02-19 NOTE — ED Notes (Addendum)
Upon arrival to the ED, pt did not respond to her name, eyes were closed, and was unable to squeeze my hands. Pt appears to still be in post-ictal state.

## 2019-02-19 NOTE — ED Notes (Signed)
Patient transported to CT 

## 2019-02-19 NOTE — H&P (Addendum)
Date: 02/19/2019               Patient Name:  Madeline Brown MRN: [id number]  DOB: Nov 01, 1975 Age / Sex: 43 y.o., female   PCP: Patient, No Pcp Per         Medical Service: Internal Medicine Teaching Service         Attending Physician: Dr. Evette Doffing, Mallie Mussel, *    First Contact: Dr. Court Joy Pager: [phone]  Second Contact: Dr. Sherry Ruffing  Pager: [phone]       After Hours (After 5p/  First Contact Pager: [phone]  weekends / holidays): Second Contact Pager: [phone]   Chief Complaint: Seizure disorder  History of Present Illness:History per chart review, patient unresponsive and family left. Confirmed history with husband over phone.    Madeline Brown is a 43 year old female with history of hypertension and end-stage renal disease. Patient returned from dialysis session on 9/5 and seemed "out of it". Daughter reported witness patient having a seizure and noticed red-tinged foam coming from her mouth. Daughter called for patients husband.  She was turned on her side by her husband. Husband state her "eyes were moving all around". Denies any bowel or bladder incontinence. Family reports patient has no history of seizure. EMS was called.    EMS reports patient was unresponsive and had another clonic seizure, 5 of versed was given and she was transported to ED.    Meds: Not confirmed, per chart review Amlodipine 10 mg QHS Calcitriol .36mcg TTHSat w/ dialysis Carvediol 25 mg BID Hydralazine 100 mg BID Hydralazine 25 mg Q8hrs Lidocaine-prilocaine (cream) Rena-Vit 1 tab QHS Spirnolactone 50mg     Allergies: Allergies as of 02/19/2019 - Review Complete 08/18/2018  Allergen Reaction Noted  . Visine [tetrahydrozoline hcl] Swelling 07/12/2015   Past Medical History:  Diagnosis Date  . Chronic kidney disease   . Hypertension     Family History:  Family History  Problem Relation Age of Onset  . Diabetes Father   . Hypertension Father   . Cancer Father   . Heart disease  Father      Social History:  Social History   Tobacco Use  . Smoking status: Current Every Day Smoker    Packs/day: 0.25    Years: 15.00    Pack years: 3.75    Types: Cigarettes  . Smokeless tobacco: Never Used  . Tobacco comment: 4 cigarettes per day  Substance Use Topics  . Alcohol use: Yes    Comment: occassional beer  . Drug use: Yes    Types: Marijuana    Review of Systems: A complete ROS was negative except as per HPI.   Physical Exam: Blood pressure (!) 157/106, pulse 79, temperature 99.1 F (37.3 C), temperature source Oral, resp. rate 18, height 5' (1.524 m), weight 54.2 kg, SpO2 94 %, unknown if currently breastfeeding.  Physical Exam Constitutional:      Appearance: She is not ill-appearing.     Comments: Patient lying in fetal position in bed .   Eyes:     Conjunctiva/sclera: Conjunctivae normal.     Pupils: Pupils are equal, round, and reactive to light.  Cardiovascular:     Rate and Rhythm: Normal rate and regular rhythm.  Pulmonary:     Breath sounds: No wheezing, rhonchi or rales.  Chest:     Chest wall: No tenderness.  Abdominal:     General: Bowel sounds are normal.     Palpations: Abdomen is soft.  Musculoskeletal:  Right lower leg: No edema.     Left lower leg: No edema.  Skin:    General: Skin is warm and dry.  Neurological:     Comments: GCS 10. She opens eyes to command, will move extremities to command, no verbal response     EKG: personally reviewed my interpretation is Sinus rhythm, prolonged P-R interval, atrial enlargement, left  ventricular hypertrophy  Assessment & Plan by Problem: Active Problems:   Seizure ([place])  43 year old woman with history of hypertension and chronic kidney disease on TTHSat dialysis presents with new onset seizures after dialysis today.  #Seizure Patient with no history of seizures.  GCS 10 [E(3) V(1) M(6)] on exam. Seizure witness by daughter and husband. 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  Neurology is following and ordered Keppra and EEG.  EEG showed potential seizure focus in the left parieto-occipital region.  Also evidence of focal cerebral dysfunction in the left posterior quadrant consistent with the patient's known mass lesion there or possible postictal state.  No active seizure recorded on study.  #ESRD Patient on dialysis Tuesdays Thursdays Saturdays.  Creatinine 3.77 no signs for emergent hemodialysis, will consult nephrology tomorrow   Diet: NPO IV fluids: none DVT ppx: Heparin  Code Status: Full   Dispo: Admit patient to Inpatient with expected length of stay greater than 2 midnights.    Signed:  Tamsen Snider, MD PGY1  [phone]

## 2019-02-19 NOTE — Procedures (Signed)
History: 43 year old female being evaluated for aphasia after a seizure  Sedation: None  Technique: This is a 21 channel routine scalp EEG performed at the bedside with bipolar and monopolar montages arranged in accordance to the international 10/20 system of electrode placement. One channel was dedicated to EKG recording.    Background: There is a well defined posterior dominant rhythm of 9 Hz which is seen bilaterally.  There is also irregular delta activity that is most prominent in the left temporal leads, F7 > T7, P7.  There are also occasional spike discharges which are maximal at P7, O1.  Photic stimulation: Physiologic driving is not performed  EEG Abnormalities: 1) left parieto-occiptal sharp waves 2) left posterior quadrant slow activity  Clinical Interpretation: This EEG recorded evidence of a potential seizure focus in the left parieto-occipital region.  There is also evidence of focal cerebral dysfunction in the left posterior quadrant which could be consistent with the patient's known mass lesion there or with postictal state.  There was no seizure recorded on this study.   Roland Rack, MD [facility] [phone]  If 7pm- 7am, please page neurology on call as listed in [place].

## 2019-02-19 NOTE — ED Notes (Signed)
This RN transported pt CT STAT with Neurology.

## 2019-02-19 NOTE — ED Provider Notes (Signed)
[facility] EMERGENCY DEPARTMENT Provider Note   CSN: [id number] Arrival date & time: 02/19/19  1305     History   Chief Complaint Chief Complaint  Patient presents with  . Seizures    HPI Madeline Brown is a 43 y.o. female with history of ESRD on HD. History obtained by husband at bedside and daughter over the phone. Patient initially somnolent and unresponsive. Patient returned from dialysis session today and seemed "out of it" which is unusual for her. After approximately 30 minutes, patient started having a clonic seizure and daughter noticed red-tinted foam coming out of her mouth. The husband turned her to her side and put a cloth on her forehead to try to calm her down. EMS was called. Patient remained unresponsive throughout and when EMS arrived at scene, the patient had another clonic seizure and was given 5 of versed and transported to ED.   LEVEL 5 CAVEAT - Patient unresponsive.    Past Medical History:  Diagnosis Date  . Chronic kidney disease   . Hypertension     Patient Active Problem List   Diagnosis Date Noted  . Pericardial effusion   . Acute respiratory failure with hypoxia ([place]) 07/11/2018  . Acute pulmonary edema ([place]) 07/09/2018  . ESRD (end stage renal disease) ([place]) 06/24/2017  . Hypertensive emergency 06/19/2017  . Hypertensive crisis   . Acute renal failure superimposed on chronic kidney disease ([place])   . Epistaxis   . Hypertension 03/23/2017  . 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 Abdominal pain 02/14/2017  . Thrombocytopenia ([place])   . ARF (acute renal failure) ([place]) 02/12/2017  . Anemia 02/12/2017  . Hypokalemia 02/12/2017  . Elevated troponin 02/12/2017  . Hyponatremia 02/12/2017  . Tachycardia 02/12/2017  . Epigastric pain     Past Surgical History:  Procedure Laterality Date  . AV FISTULA PLACEMENT Left 06/24/2017   Procedure: CREATION OF LEFT ARM BRACHIALCEPHALIC  ARTERIOVENOUS (AV) FISTULA;   Surgeon: Rosetta Posner, MD;  Location: [facility];  Service: Vascular;  Laterality: Left;  . CESAREAN SECTION     x2  . FISTULA SUPERFICIALIZATION Left 10/28/2017   Procedure: SUPERFICIALIZATION LEFT BRACHIOCEPHALIC ARTERIOVENOUS FISTULA;  Surgeon: Rosetta Posner, MD;  Location: [facility];  Service: Vascular;  Laterality: Left;  . INSERTION OF DIALYSIS CATHETER Right 06/24/2017   Procedure: INSERTION OF TUNNELED DIALYSIS CATHETER;  Surgeon: Rosetta Posner, MD;  Location: [facility];  Service: Vascular;  Laterality: Right;  . LIGATION OF COMPETING BRANCHES OF ARTERIOVENOUS FISTULA  10/28/2017   Procedure: LIGATION OF COMPETING BRANCHES OF ARTERIOVENOUS FISTULA x3;  Surgeon: Rosetta Posner, MD;  [facility];  Service: Vascular;;     OB History    Gravida  8   Para  5   Term  5   Preterm      AB  2   Living  5     SAB      TAB  2   Ectopic      Multiple      Live Births  5            Home Medications    Prior to Admission medications   Medication Sig Start Date End Date Taking? Authorizing Provider  amLODipine (NORVASC) 10 MG tablet Take 10 mg by mouth at bedtime.    [provider]  calcitRIOL (ROCALTROL) 0.25 MCG capsule Take 1 capsule (0.25 mcg total) by mouth Every Tuesday,Thursday,and Saturday with dialysis. 07/15/18   [place],  Babs Bertin, MD  carvedilol (COREG) 25 MG tablet Take 1 tablet (25 mg total) by mouth 2 (two) times daily with a meal. 06/27/17 10/23/19  Shelly Coss, MD  hydrALAZINE (APRESOLINE) 25 MG tablet Take 1 tablet (25 mg total) by mouth every 8 (eight) hours. 07/13/18   Dana Allan I, MD  multivitamin (RENA-VIT) TABS tablet Take 1 tablet by mouth at bedtime. 07/13/18   Dana Allan I, MD  spironolactone (ALDACTONE) 50 MG tablet Take 50 mg by mouth daily.    [provider]    Family History Family History  Problem Relation Age of Onset  . Diabetes Father   . Hypertension Father   . Cancer Father   . Heart disease Father      Social History Social History   Tobacco Use  . Smoking status: Current Every Day Smoker    Packs/day: 0.25    Years: 15.00    Pack years: 3.75    Types: Cigarettes  . Smokeless tobacco: Never Used  . Tobacco comment: 4 cigarettes per day  Substance Use Topics  . Alcohol use: Yes    Comment: occassional beer  . Drug use: Yes    Types: Marijuana     Allergies   Visine [tetrahydrozoline hcl]   Review of Systems Review of Systems Unable to attain ROS due to unresponsiveness.   Physical Exam Updated Vital Signs BP (!) 140/99 (BP Location: Right Arm)   Pulse 81   Resp (!) 24   SpO2 100%   Physical Exam Vitals signs reviewed.  Constitutional:      General: She is not in acute distress.    Comments: Patient is somnolent and only opening and closing eyes.  HENT:     Head: Normocephalic and atraumatic.  Eyes:     Pupils: Pupils are equal, round, and reactive to light.  Cardiovascular:     Rate and Rhythm: Normal rate and regular rhythm.     Pulses: Normal pulses.     Heart sounds: Normal heart sounds. No murmur. No friction rub. No gallop.   Pulmonary:     Effort: Pulmonary effort is normal. No respiratory distress.     Breath sounds: Normal breath sounds. No wheezing, rhonchi or rales.  Abdominal:     General: Bowel sounds are normal. There is no distension.     Palpations: Abdomen is soft.     Tenderness: There is no abdominal tenderness.  Musculoskeletal: Normal range of motion.  Skin:    General: Skin is warm and dry.  Neurological:     Mental Status: She is disoriented.     Comments: Unable to assess complete neurologic examination as patient is minimally responsive and unable to participate in exam      ED Treatments / Results  Labs (all labs ordered are listed, but only abnormal results are displayed) Labs Reviewed  URINE CULTURE  RENAL FUNCTION PANEL  CBC  LACTIC ACID, PLASMA  LACTIC ACID, PLASMA  SALICYLATE LEVEL  URINALYSIS, ROUTINE W REFLEX  MICROSCOPIC  RAPID URINE DRUG SCREEN, HOSP PERFORMED  MAGNESIUM  TROPONIN I (HIGH SENSITIVITY)    EKG None  Radiology Ct Head Wo Contrast  Result Date: 02/19/2019 CLINICAL DATA:  Seizure following dialysis. No history of seizures. EXAM: CT HEAD WITHOUT CONTRAST TECHNIQUE: Contiguous axial images were obtained from the base of the skull through the vertex without intravenous contrast. COMPARISON:  None. FINDINGS: Brain: Oval low density the mass with peripheral rim of medium density in the left parietal region.  This measures 2.1 cm in maximum diameter on image number 20 series 3. There is associated vasogenic edema in surrounding white matter. No significant mass effect is seen. Mildly enlarged ventricles and cortical sulci. Minimal patchy white matter low density in both cerebral hemispheres. No intracranial hemorrhage. Vascular: No hyperdense vessel or unexpected calcification. Skull: Normal. Negative for fracture or focal lesion. Sinuses/Orbits: Unremarkable. Other: None. IMPRESSION: 1. 2.1 cm left parietal mass with a peripheral rim of medium density and associated vasogenic edema. Differential considerations include primary brain malignancy, metastasis and abscess. Further evaluation with pre and postcontrast MRI of the brain is recommended. 2. No intracranial hemorrhage. 3. Mild diffuse cerebral and cerebellar atrophy. 4. Minimal chronic small vessel white matter ischemic changes in both cerebral hemispheres. Electronically Signed   By: Claudie Revering M.D.   On: 02/19/2019 14:07    Procedures Procedures (including critical care time)  Medications Ordered in ED Medications - No data to display   Initial Impression / Assessment and Plan / ED Course  I have reviewed the triage vital signs and the nursing notes.  Pertinent labs & imaging results that were available during my care of the patient were reviewed by me and considered in my medical decision making (see chart for details).  Patient  is a 43yo female with PMHx of ESRD on HD TTS presenting to ED after witnessed clonic seizure lasting for at least 1 minute and then another witnessed clonic seizure lasting 2-4 minutes without return to consciousness in between episodes. Second seizure witnessed by EMS and given versed 5 and transported to ED. On presentation, patient initially unresponsive and very somnolent but slowly becoming more alert. Patient only able to follow commands as far as opening and closing eyes but otherwise remains confused and does not respond to conversation.  CT Head showed 2.1 cm left parietal mass w/peripheral rim of medium density and associated vasogenic edema concerning for abscess vs tumor.   Per neuro recs, patient loaded with Keppra and getting EEG and MRI.   Patient to be admitted to inpatient medicine service.   Final Clinical Impressions(s) / ED Diagnoses   Final diagnoses:  Seizure [facility])    ED Discharge Orders    None       Harvie Heck, MD 02/19/19 1524    Carmin Muskrat, MD 02/21/19 1414

## 2019-02-19 NOTE — ED Notes (Signed)
Pt is now alert, eyes open, responds to name, and aphasic.

## 2019-02-19 NOTE — Progress Notes (Signed)
PT ADMITTED TO THE UNIT ALERT  AWAKE ORIENTED X 3 BUT FORGETFUL/ ORIENTED TO THE USE 59F CALL BELL AND THE PHONE TO CALL OUTSIDE WHEN NEEDED.   ARDIAC MONITOR IN USE VERIFICATION WITH 3 STAFF AND CC MT COMPLETED.  PLAN OF CARE DISCUSS  SAFETY AND SEIZURE PRECAUTION MAINTAINED.  RN WILL CONTINUE TO MONITOR PT.

## 2019-02-19 NOTE — ED Triage Notes (Signed)
Pt arrives with [facility] from home c/o witnessed seizure by family after dialysis. Per family, pt has no hx of seizures. Upon EMS arrival, pt was still in post-ictal state. Pt is only responsive to pain. En route 5mg  IM versed given by Ems. Pt placed on 10L non-rebreather with saturation of 98%. Pt bit tongue per EMS.  EMS vitals:  156/112 CBG 122

## 2019-02-19 NOTE — ED Notes (Signed)
ED TO INPATIENT HANDOFF REPORT  ED Nurse Name and Phone #: Annie Main [phone]  S Name/Age/Gender Madeline Brown 43 y.o. female Room/Bed: 028C/028C  Code Status   Code Status: Full Code  Home/SNF/Other Home Patient oriented to: self, place, time and situation Is this baseline? Yes   Triage Complete: Triage complete  Chief Complaint seizure  Triage Note Pt arrives with [facility] from home c/o witnessed seizure by family after dialysis. Per family, pt has no hx of seizures. Upon EMS arrival, pt was still in post-ictal state. Pt is only responsive to pain. En route 5mg  IM versed given by Ems. Pt placed on 10L non-rebreather with saturation of 98%. Pt bit tongue per EMS.  EMS vitals:  156/112 CBG 122    Allergies Allergies  Allergen Reactions  . Visine [Tetrahydrozoline Hcl] Swelling    Eyes Swelling.    Level of Care/Admitting Diagnosis ED Disposition    ED Disposition Condition [facility] Area: [facility] [100100]  Level of Care: Progressive [102]  Covid Evaluation: Asymptomatic Screening Protocol (No Symptoms)  Diagnosis: Seizure [facility]) [[id number]]  Admitting Physician: Axel Filler [phone]  Attending Physician: Axel Filler [[id number]]  Estimated length of stay: past midnight tomorrow  Certification:: I certify this patient will need inpatient services for at least 2 midnights  PT Class (Do Not Modify): Inpatient [101]  PT Acc Code (Do Not Modify): Private [1]       B Medical/Surgery History Past Medical History:  Diagnosis Date  . Chronic kidney disease   . Hypertension    Past Surgical History:  Procedure Laterality Date  . AV FISTULA PLACEMENT Left 06/24/2017   Procedure: CREATION OF LEFT ARM BRACHIALCEPHALIC  ARTERIOVENOUS (AV) FISTULA;  Surgeon: Rosetta Posner, MD;  Location: [facility];  Service: Vascular;  Laterality: Left;  . CESAREAN SECTION     x2  . FISTULA SUPERFICIALIZATION Left 10/28/2017   Procedure: SUPERFICIALIZATION LEFT BRACHIOCEPHALIC ARTERIOVENOUS FISTULA;  Surgeon: Rosetta Posner, MD;  Location: [facility];  Service: Vascular;  Laterality: Left;  . INSERTION OF DIALYSIS CATHETER Right 06/24/2017   Procedure: INSERTION OF TUNNELED DIALYSIS CATHETER;  Surgeon: Rosetta Posner, MD;  Location: [facility];  Service: Vascular;  Laterality: Right;  . LIGATION OF COMPETING BRANCHES OF ARTERIOVENOUS FISTULA  10/28/2017   Procedure: LIGATION OF COMPETING BRANCHES OF ARTERIOVENOUS FISTULA x3;  Surgeon: Rosetta Posner, MD;  [facility];  Service: Vascular;;     A IV Location/Drains/Wounds Patient Lines/Drains/Airways Status   Active Line/Drains/Airways    Name:   Placement date:   Placement time:   Site:   Days:   Peripheral IV 02/19/19 Right Antecubital   02/19/19    1430    Antecubital   less than 1   Fistula / Graft Left Upper arm Arteriovenous fistula   06/24/17    1411    Upper arm   605   Hemodialysis Catheter Right Subclavian Double-lumen   06/24/17    1321    Subclavian   605   Incision (Closed) 06/24/17 Arm Left   06/24/17    1423     605   Incision (Closed) 10/28/17 Arm Left   10/28/17    1101     479          Intake/Output Last 24 hours No intake or output data in the 24 hours ending 02/19/19 1823  Labs/Imaging Results for orders placed or performed during the hospital encounter of 02/19/19 (from  the past 48 hour(s))  Renal function panel     Status: Abnormal   Collection Time: 02/19/19  2:35 PM  Result Value Ref Range   Sodium 136 135 - 145 mmol/L   Potassium 3.9 3.5 - 5.1 mmol/L   Chloride 92 (L) 98 - 111 mmol/L   CO2 30 22 - 32 mmol/L   Glucose, Bld 85 70 - 99 mg/dL   BUN 16 6 - 20 mg/dL   Creatinine, Ser 3.77 (H) 0.44 - 1.00 mg/dL   Calcium 8.7 (L) 8.9 - 10.3 mg/dL   Phosphorus 4.3 2.5 - 4.6 mg/dL   Albumin 3.8 3.5 - 5.0 g/dL   GFR calc non Af Amer 14 (L) >60 mL/min   GFR calc Af Amer 16 (L) >60 mL/min   Anion gap 14 5 - 15    Comment: Performed at [facility] [address] [place], [place] [address]  CBC     Status: Abnormal   Collection Time: 02/19/19  2:35 PM  Result Value Ref Range   WBC 5.1 4.0 - 10.5 K/uL   RBC 3.57 (L) 3.87 - 5.11 MIL/uL   Hemoglobin 11.9 (L) 12.0 - 15.0 g/dL   HCT 35.6 (L) 36.0 - 46.0 %   MCV 99.7 80.0 - 100.0 fL   MCH 33.3 26.0 - 34.0 pg   MCHC 33.4 30.0 - 36.0 g/dL   RDW 13.9 11.5 - 15.5 %   Platelets 126 (L) 150 - 400 K/uL   nRBC 0.0 0.0 - 0.2 %    Comment: Performed at [facility], [place] [address] [place], [place] [address]  Troponin I (High Sensitivity)     Status: Abnormal   Collection Time: 02/19/19  2:35 PM  Result Value Ref Range   Troponin I (High Sensitivity) 46 (H) <18 ng/L    Comment: Performed at [facility] [address], [place], [place] [address]  Lactic acid, plasma     Status: Abnormal   Collection Time: 02/19/19  2:35 PM  Result Value Ref Range   Lactic Acid, Venous 3.3 (HH) 0.5 - 1.9 mmol/L    Comment: CRITICAL RESULT CALLED TO, READ BACK BY AND VERIFIED WITH: BOHBI, N RN @ 6010 ON 02/19/2019 BY TEMOCHE, H Performed at [facility], [place] [address] [place], [place] [address]   Salicylate level     Status: None   Collection Time: 02/19/19  2:35 PM  Result Value Ref Range   Salicylate Lvl <5.7 2.8 - 30.0 mg/dL    Comment: Performed at [facility] [address], [place], [place] [address]  Magnesium     Status: None   Collection Time: 02/19/19  2:35 PM  Result Value Ref Range   Magnesium 2.3 1.7 - 2.4 mg/dL    Comment: Performed at [facility], [place] [address] [place], [place] [address]  Urinalysis, Routine w reflex microscopic     Status: Abnormal   Collection Time: 02/19/19  3:45 PM  Result Value Ref Range   Color, Urine YELLOW YELLOW   APPearance CLOUDY (A) CLEAR   Specific Gravity, Urine 1.011 1.005 - 1.030   pH 9.0 (H) 5.0 - 8.0   Glucose, UA NEGATIVE NEGATIVE mg/dL   Hgb urine dipstick NEGATIVE NEGATIVE   Bilirubin Urine NEGATIVE  NEGATIVE   Ketones, ur NEGATIVE NEGATIVE mg/dL   Protein, ur >=300 (A) NEGATIVE mg/dL   Nitrite NEGATIVE NEGATIVE   Leukocytes,Ua NEGATIVE NEGATIVE  RBC / HPF 0-5 0 - 5 RBC/hpf   WBC, UA 11-20 0 - 5 WBC/hpf   Bacteria, UA RARE (A) NONE SEEN   Squamous Epithelial / LPF 21-50 0 - 5   Mucus PRESENT    Hyaline Casts, UA PRESENT     Comment: Performed at [facility], [place] [address], [place], [place] [address]  Urine rapid drug screen (hosp performed)     Status: Abnormal   Collection Time: 02/19/19  3:45 PM  Result Value Ref Range   Opiates NONE DETECTED NONE DETECTED   Cocaine NONE DETECTED NONE DETECTED   Benzodiazepines POSITIVE (A) NONE DETECTED   Amphetamines NONE DETECTED NONE DETECTED   Tetrahydrocannabinol POSITIVE (A) NONE DETECTED   Barbiturates NONE DETECTED NONE DETECTED    Comment: Performed at [facility], [place] [address], [place], [place] [address]  SARS Coronavirus 2 [facility] order, Performed in [facility] hospital lab) Nasopharyngeal Nasopharyngeal Swab     Status: None   Collection Time: 02/19/19  4:46 PM   Specimen: Nasopharyngeal Swab  Result Value Ref Range   SARS Coronavirus 2 NEGATIVE NEGATIVE    Comment: (NOTE) If result is NEGATIVE SARS-CoV-2 target nucleic acids are NOT DETECTED. The SARS-CoV-2 RNA is generally detectable in upper and lower  respiratory specimens during the acute phase of infection. The lowest  concentration of SARS-CoV-2 viral copies this assay can detect is 250  copies / mL. A negative result does not preclude SARS-CoV-2 infection  and should not be used as the sole basis for treatment or other  patient management decisions.  A negative result may occur with  improper specimen collection / handling, submission of specimen other  than nasopharyngeal swab, presence of viral mutation(s) within the  areas targeted by this assay, and inadequate number of viral copies  (<250 copies / mL). A negative result must be combined with  clinical  observations, patient history, and epidemiological information. If result is POSITIVE SARS-CoV-2 target nucleic acids are DETECTED. The SARS-CoV-2 RNA is generally detectable in upper and lower  respiratory specimens dur ing the acute phase of infection.  Positive  results are indicative of active infection with SARS-CoV-2.  Clinical  correlation with patient history and other diagnostic information is  necessary to determine patient infection status.  Positive results do  not rule out bacterial infection or co-infection with other viruses. If result is PRESUMPTIVE POSTIVE SARS-CoV-2 nucleic acids MAY BE PRESENT.   A presumptive positive result was obtained on the submitted specimen  and confirmed on repeat testing.  While 2019 novel coronavirus  (SARS-CoV-2) nucleic acids may be present in the submitted sample  additional confirmatory testing may be necessary for epidemiological  and / or clinical management purposes  to differentiate between  SARS-CoV-2 and other Sarbecovirus currently known to infect humans.  If clinically indicated additional testing with an alternate test  methodology [phone]) is advised. The SARS-CoV-2 RNA is generally  detectable in upper and lower respiratory sp ecimens during the acute  phase of infection. The expected result is Negative. Fact Sheet for Patients:  StrictlyIdeas.no Fact Sheet for Healthcare Providers: BankingDealers.co.za This test is not yet approved or cleared by the [place] FDA and has been authorized for detection and/or diagnosis of SARS-CoV-2 by FDA under an Emergency Use Authorization (EUA).  This EUA will remain in effect (meaning this test can be used) for the duration of the COVID-19 declaration under Section 564(b)(1) of the Act, 21 U.S.C. section 360bbb-3(b)(1), unless the  authorization is terminated or revoked sooner. Performed at [facility], [place]  [address] [place], [place] [address]    Ct Head Wo Contrast  Result Date: 02/19/2019 CLINICAL DATA:  Seizure following dialysis. No history of seizures. EXAM: CT HEAD WITHOUT CONTRAST TECHNIQUE: Contiguous axial images were obtained from the base of the skull through the vertex without intravenous contrast. COMPARISON:  None. FINDINGS: Brain: Oval low density the mass with peripheral rim of medium density in the left parietal region. This measures 2.1 cm in maximum diameter on image number 20 series 3. There is associated vasogenic edema in surrounding white matter. No significant mass effect is seen. Mildly enlarged ventricles and cortical sulci. Minimal patchy white matter low density in both cerebral hemispheres. No intracranial hemorrhage. Vascular: No hyperdense vessel or unexpected calcification. Skull: Normal. Negative for fracture or focal lesion. Sinuses/Orbits: Unremarkable. Other: None. IMPRESSION: 1. 2.1 cm left parietal mass with a peripheral rim of medium density and associated vasogenic edema. Differential considerations include primary brain malignancy, metastasis and abscess. Further evaluation with pre and postcontrast MRI of the brain is recommended. 2. No intracranial hemorrhage. 3. Mild diffuse cerebral and cerebellar atrophy. 4. Minimal chronic small vessel white matter ischemic changes in both cerebral hemispheres. Electronically Signed   By: Claudie Revering M.D.   On: 02/19/2019 14:07    Pending Labs Unresulted Labs (From admission, onward)    Start     Ordered   02/20/19 0500  CBC  Tomorrow morning,   R     02/19/19 1743   02/19/19 1343  Urine culture  ONCE - STAT,   STAT     02/19/19 1343   02/19/19 1326  Lactic acid, plasma  Now then every 2 hours,   STAT     02/19/19 1325          Vitals/Pain Today's Vitals   02/19/19 1315 02/19/19 1316 02/19/19 1330 02/19/19 1540  BP: (!) 140/99 (!) 140/99 123/88   Pulse: 83 81 79   Resp: (!) 26 (!) 24 (!) 23   Temp:    99.4 F (37.4  C)  TempSrc:    Oral  SpO2: 100% 100% 100%     Isolation Precautions Airborne and Contact precautions  Medications Medications  levETIRAcetam (KEPPRA) IVPB 1000 mg/100 mL premix (has no administration in time range)  heparin injection 5,000 Units (has no administration in time range)  acetaminophen (TYLENOL) tablet 650 mg (has no administration in time range)    Or  acetaminophen (TYLENOL) suppository 650 mg (has no administration in time range)  levETIRAcetam (KEPPRA) 2,000 mg in sodium chloride 0.9 % 100 mL IVPB (0 mg Intravenous Stopped 02/19/19 1646)    Mobility walks High fall risk   Focused Assessments Neuro Assessment Handoff:  Swallow screen pass? Deferred         Neuro Assessment: Exceptions to WDL Neuro Checks:      Last Documented NIHSS Modified Score:   Has TPA been given? No If patient is a Neuro Trauma and patient is going to OR before floor call report to [facility] nurse: [phone] or [phone]     R Recommendations: See Admitting Provider Note  Report given to:   Additional Notes:

## 2019-02-19 NOTE — ED Notes (Signed)
Got patient into a gown on the monitor did ekg shown to Dr Vanita Panda patient is resting with call bell in reach

## 2019-02-19 NOTE — ED Notes (Signed)
Notified Dr. Ronnald Nian of pt's lactic acid of 3.3

## 2019-02-19 NOTE — Consult Note (Addendum)
NEURO HOSPITALIST CONSULT NOTE   Requestig physician: Dr. Vanita Panda   Reason for Consult:seizure   History obtained from:  Family/ chart  HPI:                                                                                                                                          Madeline Brown is an 43 y.o. female  With Hx HTN and chronic kidney disease on dialysis.    Per husband/ kids about 1245 patient had whole body jerking. Her eyes were closed. There was red/pink/ froth coming out of her mouth. This episode lasted about 1 minute. Since then she has been very slowly coming around. Per husband she is still " confused", but she is a lot better than when she arrived. Patient had dialysis today and after dialysis she came home. She is always tired after dialysis. Her husband noticed that her " eyes looked funny" does not remember which way she was gazing. Then she fell asleep. He went upstairs. He was upstairs for 5 minutes at the most and his daughters started screaming that something was wrong. He returned to find his wife having a seizure. This has never happened before and she does not have a known seizure history. Was not incontinent of bowel or bladder. Patient does still make urine.   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 Differential considerations include primary brain malignancy, metastasis and abscess.   Past Medical History:  Diagnosis Date  . Chronic kidney disease   . Hypertension     Past Surgical History:  Procedure Laterality Date  . AV FISTULA PLACEMENT Left 06/24/2017   Procedure: CREATION OF LEFT ARM BRACHIALCEPHALIC  ARTERIOVENOUS (AV) FISTULA;  Surgeon: Rosetta Posner, MD;  Location: [place];  Service: Vascular;  Laterality: Left;  . CESAREAN SECTION     x2  . FISTULA SUPERFICIALIZATION Left 10/28/2017   Procedure: SUPERFICIALIZATION LEFT BRACHIOCEPHALIC  ARTERIOVENOUS FISTULA;  Surgeon: Rosetta Posner, MD;  Location: [place];  Service: Vascular;  Laterality: Left;  . INSERTION OF DIALYSIS CATHETER Right 06/24/2017   Procedure: INSERTION OF TUNNELED DIALYSIS CATHETER;  Surgeon: Rosetta Posner, MD;  Location: [place];  Service: Vascular;  Laterality: Right;  . LIGATION OF COMPETING BRANCHES OF ARTERIOVENOUS FISTULA  10/28/2017   Procedure: LIGATION OF COMPETING BRANCHES OF ARTERIOVENOUS FISTULA x3;  Surgeon: Rosetta Posner, MD;  [facility];  Service: Vascular;;    Family History  Problem Relation Age of Onset  . Diabetes Father   . Hypertension Father   . Cancer Father   . Heart disease Father         Social History:  reports that she has been smoking cigarettes.  She has a 3.75 pack-year smoking history. She has never used smokeless tobacco. She reports current alcohol use. She reports current drug use. Drug: Marijuana.  Allergies  Allergen Reactions  . Visine [Tetrahydrozoline Hcl] Swelling    Eyes Swelling.    MEDICATIONS:                                                                                                                     No current facility-administered medications for this encounter.    Current Outpatient Medications  Medication Sig Dispense Refill  . amLODipine (NORVASC) 10 MG tablet Take 10 mg by mouth at bedtime.    . calcitRIOL (ROCALTROL) 0.25 MCG capsule Take 1 capsule (0.25 mcg total) by mouth Every Tuesday,Thursday,and Saturday with dialysis. 30 capsule 0  . carvedilol (COREG) 25 MG tablet Take 1 tablet (25 mg total) by mouth 2 (two) times daily with a meal. 60 tablet 0  . hydrALAZINE (APRESOLINE) 25 MG tablet Take 1 tablet (25 mg total) by mouth every 8 (eight) hours. 90 tablet 0  . multivitamin (RENA-VIT) TABS tablet Take 1 tablet by mouth at bedtime. 30 tablet 0  . spironolactone (ALDACTONE) 50 MG tablet Take 50 mg by mouth daily.        ROS:                                                                                                                                         unobtainable from patient due to mental status  Blood pressure (!) 140/99, pulse 81, resp. rate (!) 24, SpO2 100 %, unknown if currently breastfeeding.   General Examination:                                                                                                       Physical Exam  HEENT-  Normocephalic, no lesions, without obvious abnormality.  Normal external eye and conjunctiva.   Cardiovascular- S1-S2 audible, pulses palpable throughout   Lungs- no excessive working  breathing.  Saturations within normal limits on a non-rebreather Extremities- Warm, dry and intact Musculoskeletal-no joint tenderness, deformity or swelling Skin-warm and dry, no hyperpigmentation, vitiligo, or suspicious lesions  Neurological Examination Mental Status: Awake, aphasic. Naming not intact.  oriented, thought content appropriate.  Speech fluent without evidence of aphasia.  Unable to follow all commands. When asked to give examiner a thumbs up. Patient just moved her hands but was unable to give a thumbs up.speech at times is garbled.  Cranial Nerves: ST:MHDQQI fields grossly normal,  III,IV, VI: ptosis not present, extra-ocular motions intact bilaterally pupils equal, round, reactive to light and accommodation V,VII: smile symmetric, facial light touch sensation normal bilaterally VIII: hearing normal bilaterally IX,X: uvula rises symmetrically XI: bilateral shoulder shrug XII: midline tongue extension Motor: Right : Upper extremity   5/5   Left:     Upper extremity   5/5  Lower extremity   5/5    Lower extremity   5/5 Tone and bulk:normal tone throughout; no atrophy noted Sensory: withdraws all 4 to noxious stimuli Plantars: Right: downgoing   Left: downgoing Cerebellar: Unable to assess d/t mental status Gait: deferred   Lab Results: Basic Metabolic Panel: No results for input(s): NA, K, CL, CO2, GLUCOSE, BUN,  CREATININE, CALCIUM, MG, PHOS in the last 168 hours.  CBC: No results for input(s): WBC, NEUTROABS, HGB, HCT, MCV, PLT in the last 168 hours.  Cardiac Enzymes: No results for input(s): CKTOTAL, CKMB, CKMBINDEX, TROPONINI in the last 168 hours.  Lipid Panel: No results for input(s): CHOL, TRIG, HDL, CHOLHDL, VLDL, LDLCALC in the last 168 hours.  Imaging: Ct Head Wo Contrast  Result Date: 02/19/2019 CLINICAL DATA:  Seizure following dialysis. No history of seizures. EXAM: CT HEAD WITHOUT CONTRAST TECHNIQUE: Contiguous axial images were obtained from the base of the skull through the vertex without intravenous contrast. COMPARISON:  None. FINDINGS: Brain: Oval low density the mass with peripheral rim of medium density in the left parietal region. This measures 2.1 cm in maximum diameter on image number 20 series 3. There is associated vasogenic edema in surrounding white matter. No significant mass effect is seen. Mildly enlarged ventricles and cortical sulci. Minimal patchy white matter low density in both cerebral hemispheres. No intracranial hemorrhage. Vascular: No hyperdense vessel or unexpected calcification. Skull: Normal. Negative for fracture or focal lesion. Sinuses/Orbits: Unremarkable. Other: None. IMPRESSION: 1. 2.1 cm left parietal mass with a peripheral rim of medium density and associated vasogenic edema. Differential considerations include primary brain malignancy, metastasis and abscess. Further evaluation with pre and postcontrast MRI of the brain is recommended. 2. No intracranial hemorrhage. 3. Mild diffuse cerebral and cerebellar atrophy. 4. Minimal chronic small vessel white matter ischemic changes in both cerebral hemispheres. Electronically Signed   By: Claudie Revering M.D.   On: 02/19/2019 14:07    Assessment: 43 year old female with a history of HTN and chronic kidney disease on dialysis. Came in with a likely first time seizure after dialysis. CTH: revealed a 2.1 cm parietal  mass. MRI brain w/ wo contrast, EEG and Keppra 1000 mg ordered.     Recommendations: -- stat EEG --MRI w& w/o contrast recommended but GFR= 16.; so MRI w/o contrast --Keppra load 2000 mg now; followed by 1000 mg keppra daily with an additional 500 mg dose after dialysis.  -neurology to continue to follow  Laurey Morale, MSN, NP-C [facility] [phone]  Attending neurologist's note to follow   02/19/2019, 2:28 PM   I have seen  the patient and reviewed the above note.  On exam, she did have an aphasia with perseveration and word substitution.  She was able to follow commands and completely (when asked to show 2 fingers she showed her whole hand, when asked to stick out her tongue she simply open her mouth).  New onset seizure in a patient with a parietal mass.  My suspicion is that this is likely going to represent metastatic disease given the appearance on CT, but further evaluation with MRI is needed.  If this is the only lesion, then surgical excision may be desirable and could discuss with neurosurgery.  In any case, if the MRI is more consistent with metastatic disease then abscess, I would start metastatic work-up as well.  From a seizure perspective, her stat EEG did not reveal any ongoing seizures, and therefore I suspect that she is having a prolonged postictal state due to structural lesion.  I would favor getting an MRI prior to deciding about steroids.    Roland Rack, MD [facility] [phone]  If 7pm- 7am, please page neurology on call as listed in [place].

## 2019-02-19 NOTE — Progress Notes (Signed)
STAT EEG complete - results pending. ? ?

## 2019-02-20 ENCOUNTER — Inpatient Hospital Stay (HOSPITAL_COMMUNITY): Payer: Medicare Other

## 2019-02-20 ENCOUNTER — Other Ambulatory Visit: Payer: Self-pay

## 2019-02-20 ENCOUNTER — Encounter (HOSPITAL_COMMUNITY): Payer: Self-pay | Admitting: *Deleted

## 2019-02-20 DIAGNOSIS — S0689AA Other specified intracranial injury with loss of consciousness status unknown, initial encounter: Secondary | ICD-10-CM | POA: Diagnosis present

## 2019-02-20 DIAGNOSIS — N186 End stage renal disease: Secondary | ICD-10-CM

## 2019-02-20 DIAGNOSIS — Z79899 Other long term (current) drug therapy: Secondary | ICD-10-CM

## 2019-02-20 DIAGNOSIS — R569 Unspecified convulsions: Secondary | ICD-10-CM

## 2019-02-20 DIAGNOSIS — I120 Hypertensive chronic kidney disease with stage 5 chronic kidney disease or end stage renal disease: Secondary | ICD-10-CM

## 2019-02-20 DIAGNOSIS — S06350A Traumatic hemorrhage of left cerebrum without loss of consciousness, initial encounter: Secondary | ICD-10-CM

## 2019-02-20 DIAGNOSIS — X58XXXA Exposure to other specified factors, initial encounter: Secondary | ICD-10-CM

## 2019-02-20 DIAGNOSIS — Z992 Dependence on renal dialysis: Secondary | ICD-10-CM

## 2019-02-20 HISTORY — DX: Other specified intracranial injury with loss of consciousness status unknown, initial encounter: S06.89AA

## 2019-02-20 LAB — URINE CULTURE: Culture: 40000 — AB

## 2019-02-20 LAB — CBC
HCT: 33.4 % — ABNORMAL LOW (ref 36.0–46.0)
Hemoglobin: 11.1 g/dL — ABNORMAL LOW (ref 12.0–15.0)
MCH: 33 pg (ref 26.0–34.0)
MCHC: 33.2 g/dL (ref 30.0–36.0)
MCV: 99.4 fL (ref 80.0–100.0)
Platelets: 120 10*3/uL — ABNORMAL LOW (ref 150–400)
RBC: 3.36 MIL/uL — ABNORMAL LOW (ref 3.87–5.11)
RDW: 13.9 % (ref 11.5–15.5)
WBC: 4.8 10*3/uL (ref 4.0–10.5)
nRBC: 0 % (ref 0.0–0.2)

## 2019-02-20 LAB — GLUCOSE, CAPILLARY: Glucose-Capillary: 76 mg/dL (ref 70–99)

## 2019-02-20 LAB — TROPONIN I (HIGH SENSITIVITY): Troponin I (High Sensitivity): 67 ng/L — ABNORMAL HIGH (ref <18)

## 2019-02-20 MED ORDER — HYDRALAZINE HCL 25 MG PO TABS
25.0000 mg | ORAL_TABLET | Freq: Once | ORAL | Status: AC
  Administered 2019-02-20: 25 mg via ORAL
  Filled 2019-02-20: qty 1

## 2019-02-20 MED ORDER — DEXAMETHASONE 4 MG PO TABS
4.0000 mg | ORAL_TABLET | Freq: Two times a day (BID) | ORAL | Status: DC
  Administered 2019-02-20: 4 mg via ORAL
  Filled 2019-02-20: qty 1

## 2019-02-20 MED ORDER — CARVEDILOL 12.5 MG PO TABS
25.0000 mg | ORAL_TABLET | Freq: Two times a day (BID) | ORAL | Status: DC
  Administered 2019-02-20 – 2019-02-21 (×3): 25 mg via ORAL
  Filled 2019-02-20 (×3): qty 2

## 2019-02-20 MED ORDER — LEVETIRACETAM 500 MG PO TABS
1000.0000 mg | ORAL_TABLET | Freq: Every day | ORAL | Status: DC
  Administered 2019-02-21: 1000 mg via ORAL
  Filled 2019-02-20: qty 2

## 2019-02-20 MED ORDER — HYDRALAZINE HCL 50 MG PO TABS
100.0000 mg | ORAL_TABLET | Freq: Two times a day (BID) | ORAL | Status: DC
  Administered 2019-02-20 – 2019-02-21 (×3): 100 mg via ORAL
  Filled 2019-02-20 (×4): qty 2

## 2019-02-20 NOTE — Progress Notes (Signed)
Pt awake no active seizure  Noted throughout the shift ,however BP remained high MD on call made aware. Medication given. Will continue to monitor

## 2019-02-20 NOTE — Evaluation (Signed)
Clinical/Bedside Swallow Evaluation Patient Details  Name: Madeline Brown MRN: [id number] Date of Birth: 1975-10-17  Today's Date: 02/20/2019 Time: SLP Start Time (ACUTE ONLY): 0945 SLP Stop Time (ACUTE ONLY): 1000 SLP Time Calculation (min) (ACUTE ONLY): 15 min  Past Medical History:  Past Medical History:  Diagnosis Date  . Anemia   . Chronic kidney disease   . Hypertension    Past Surgical History:  Past Surgical History:  Procedure Laterality Date  . AV FISTULA PLACEMENT Left 06/24/2017   Procedure: CREATION OF LEFT ARM BRACHIALCEPHALIC  ARTERIOVENOUS (AV) FISTULA;  Surgeon: Rosetta Posner, MD;  Location: [place];  Service: Vascular;  Laterality: Left;  . CESAREAN SECTION     x2  . FISTULA SUPERFICIALIZATION Left 10/28/2017   Procedure: SUPERFICIALIZATION LEFT BRACHIOCEPHALIC ARTERIOVENOUS FISTULA;  Surgeon: Rosetta Posner, MD;  Location: [place];  Service: Vascular;  Laterality: Left;  . INSERTION OF DIALYSIS CATHETER Right 06/24/2017   Procedure: INSERTION OF TUNNELED DIALYSIS CATHETER;  Surgeon: Rosetta Posner, MD;  Location: [place];  Service: Vascular;  Laterality: Right;  . LIGATION OF COMPETING BRANCHES OF ARTERIOVENOUS FISTULA  10/28/2017   Procedure: LIGATION OF COMPETING BRANCHES OF ARTERIOVENOUS FISTULA x3;  Surgeon: Rosetta Posner, MD;  [facility];  Service: Vascular;;   HPI:  43 year old woman with history of hypertension and chronic kidney disease on TTHSat dialysis presents with new onset seizures after dialysis. MRI showed 2.1 cm left parietal mass with vasogenic edema.     Assessment / Plan / Recommendation Clinical Impression  Pt demonstrates normal swallow function subjectively. Pt may resume a regular texture diet with no SLP f/u for swallowing. Mild aphasia noted at bedside. Will proceed with cognitive lingusitic eval.  SLP Visit Diagnosis: Dysphagia, unspecified (R13.10)    Aspiration Risk  Mild aspiration risk    Diet Recommendation Regular;Thin liquid   Liquid  Administration via: Cup;Straw Medication Administration: Whole meds with liquid Supervision: Patient able to self feed    Other  Recommendations     Follow up Recommendations        Frequency and Duration            Prognosis        Swallow Study   General HPI: 43 year old woman with history of hypertension and chronic kidney disease on TTHSat dialysis presents with new onset seizures after dialysis. MRI showed 2.1 cm left parietal mass with vasogenic edema.   Type of Study: Bedside Swallow Evaluation Previous Swallow Assessment: none Diet Prior to this Study: Thin liquids Temperature Spikes Noted: No Respiratory Status: Room air History of Recent Intubation: No Behavior/Cognition: Alert;Cooperative;Pleasant mood Oral Cavity Assessment: Within Functional Limits Oral Care Completed by SLP: No Oral Cavity - Dentition: Adequate natural dentition Vision: Functional for self-feeding Self-Feeding Abilities: Able to feed self Patient Positioning: Upright in bed Baseline Vocal Quality: Normal Volitional Cough: Strong Volitional Swallow: Able to elicit    Oral/Motor/Sensory Function Overall Oral Motor/Sensory Function: Within functional limits   Ice Chips Ice chips: Not tested   Thin Liquid Thin Liquid: Within functional limits    Nectar Thick Nectar Thick Liquid: Not tested   Honey Thick Honey Thick Liquid: Not tested   Puree Puree: Within functional limits Presentation: Spoon   Solid     Solid: Within functional limits Presentation: [place] Narada Uzzle, MA [place] Pager [phone] Office [phone]  Lynann Beaver 02/20/2019,10:00 AM

## 2019-02-20 NOTE — Progress Notes (Signed)
Internal Medicine Teaching Service Attending:   I saw and examined the patient. I reviewed the resident's note and I agree with the resident's findings and plan as documented in the resident's note.  Principal Problem:   Intracranial hematoma (HCC) Active Problems:   Hypertension   ESRD (end stage renal disease) ([place])   Seizure [facility])  Hospital day #1 for this 43 year old woman living with end-stage renal disease on hemodialysis through a left upper extremity AV fistula admitted with new onset seizure and found to have a left parietal lobe intracerebral hematoma.  Etiology of the hematoma is likely either a spontaneous bleed in the setting of difficult to control hypertension, possibly an ischemic CVA that had hemorrhagic conversion.  Greatly appreciate neurology consultation, continuing Keppra for treatment of seizure.  Agree dexamethasone given the associated vasogenic edema.  Symptoms are much improved today now that she is out of the postictal phase, only neuro deficit is with word finding difficulty.  We will follow-up with PT and OT recommendations.  Monitoring blood pressure carefully, systolic goal less than 329.  Lalla Brothers, MD FACP

## 2019-02-20 NOTE — Progress Notes (Signed)
Subjective: Back to baseline, she denies any recent symptoms such as numbness, weakness or recent difficulty with speech.  Exam: Vitals:   02/20/19 0500 02/20/19 0840  BP: (!) 162/104 (!) 147/99  Pulse: 78 80  Resp:  16  Temp:  98.6 F (37 C)  SpO2:  99%   Gen: In bed, NAD Resp: non-labored breathing, no acute distress Abd: soft, nt  Neuro: MS: Awake, alert, can name simple objects and repeat CN: Pupils equal round and reactive, extraocular movements intact Motor: She has full strength bilaterally Sensory: Intact light touch and temperature   Impression: 43 year old female with new onset seizure in the setting of parenchymal hemorrhage.  The hemorrhage is not acute, likely happened at least 1 to 2 weeks ago.  It is unusual for hemorrhage of the size to be asymptomatic, but no clear underlying lesion on MRI.  I do think that repeat imaging once the hemorrhage has had a chance to resolve would be prudent.  Given uncontrolled blood pressure, my suspicion is either hypertensive hemorrhage versus hemorrhagic conversion of ischemic infarct.  Recommendations: 1) BP goal less than 160 2) continue Keppra 500 twice daily 3) stroke team to follow in the morning  Roland Rack, MD [facility] [phone]  If 7pm- 7am, please page neurology on call as listed in [place].

## 2019-02-20 NOTE — Progress Notes (Signed)
Pt went down for MRI

## 2019-02-20 NOTE — Evaluation (Signed)
Speech Language Pathology Evaluation Patient Details Name: Madeline Brown MRN: [id number] DOB: February 16, 1976 Today's Date: 02/20/2019 Time: 7096-2836 SLP Time Calculation (min) (ACUTE ONLY): 16 min  Problem List:  Patient Active Problem List   Diagnosis Date Noted  . Seizure ([place]) 02/19/2019  . Pericardial effusion   . Acute respiratory failure with hypoxia ([place]) 07/11/2018  . Acute pulmonary edema ([place]) 07/09/2018  . ESRD (end stage renal disease) ([place]) 06/24/2017  . Hypertensive emergency 06/19/2017  . Hypertensive crisis   . Acute renal failure superimposed on chronic kidney disease ([place])   . Epistaxis   . Hypertension 03/23/2017  . 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 Abdominal pain 02/14/2017  . Thrombocytopenia ([place])   . ARF (acute renal failure) ([place]) 02/12/2017  . Anemia 02/12/2017  . Hypokalemia 02/12/2017  . Elevated troponin 02/12/2017  . Hyponatremia 02/12/2017  . Tachycardia 02/12/2017  . Epigastric pain    Past Medical History:  Past Medical History:  Diagnosis Date  . Anemia   . Chronic kidney disease   . Hypertension    Past Surgical History:  Past Surgical History:  Procedure Laterality Date  . AV FISTULA PLACEMENT Left 06/24/2017   Procedure: CREATION OF LEFT ARM BRACHIALCEPHALIC  ARTERIOVENOUS (AV) FISTULA;  Surgeon: Rosetta Posner, MD;  Location: [place];  Service: Vascular;  Laterality: Left;  . CESAREAN SECTION     x2  . FISTULA SUPERFICIALIZATION Left 10/28/2017   Procedure: SUPERFICIALIZATION LEFT BRACHIOCEPHALIC ARTERIOVENOUS FISTULA;  Surgeon: Rosetta Posner, MD;  Location: [place];  Service: Vascular;  Laterality: Left;  . INSERTION OF DIALYSIS CATHETER Right 06/24/2017   Procedure: INSERTION OF TUNNELED DIALYSIS CATHETER;  Surgeon: Rosetta Posner, MD;  Location: [place];  Service: Vascular;  Laterality: Right;  . LIGATION OF COMPETING BRANCHES OF ARTERIOVENOUS FISTULA  10/28/2017   Procedure: LIGATION OF COMPETING BRANCHES OF  ARTERIOVENOUS FISTULA x3;  Surgeon: Rosetta Posner, MD;  [facility];  Service: Vascular;;   HPI:  43 year old woman with history of hypertension and chronic kidney disease on TTHSat dialysis presents with new onset seizures after dialysis. MRI showed 2.1 cm left parietal mass with vasogenic edema.     Assessment / Plan / Recommendation Clinical Impression  During limited linguistic assessment due to arrival of providers, pt demonstrated mild expressive deficits primarily presnting as anomia during word finding in conversation and intermittent phonemic paraphasias at conversation level. Pts also demonstrated auditory comprehension deficits during  multistep/complex command following tasks, though she participated appropriately in moderately complex conversations. Pt was not aware of aphasia until assessment, but she did notice her errors and made attempts to correct. She was appropriately concerned about this problem. Did not yet assess reading, wrtiting or any visuospatial abilities. Recommend acute f/u for further diagnostic treatment, but also likely outpatient SLP treatment after d/c.      SLP Assessment  SLP Recommendation/Assessment: Patient needs continued Speech Lanaguage Pathology Services SLP Visit Diagnosis: Dysphagia, unspecified (R13.10)    Follow Up Recommendations  Outpatient SLP    Frequency and Duration min 2x/week  2 weeks      SLP Evaluation Cognition  Overall Cognitive Status: Within Functional Limits for tasks assessed       Comprehension  Auditory Comprehension Overall Auditory Comprehension: Impaired Yes/No Questions: Impaired Basic Biographical Questions: 76-100% accurate Basic Immediate Environment Questions: 75-100% accurate Complex Questions: 50-74% accurate Commands: Impaired One Step Basic Commands: 75-100% accurate Two Step Basic Commands: 50-74% accurate Multistep Basic Commands: 0-24% accurate Complex Commands: 0-24% accurate  Conversation:  Complex Reading Comprehension Reading Status: Not tested    Expression Verbal Expression Overall Verbal Expression: Impaired Initiation: No impairment Automatic Speech: Name;Social Response Level of Generative/Spontaneous Verbalization: Conversation Repetition: Impaired Level of Impairment: Sentence level Naming: Impairment(spontaneous errors in conversation) Confrontation: Within functional limits Verbal Errors: Phonemic paraphasias   Oral / Motor  Oral Motor/Sensory Function Overall Oral Motor/Sensory Function: Within functional limits Motor Speech Overall Motor Speech: Appears within functional limits for tasks assessed   GO                   Herbie Baltimore, MA [place] Pager [phone] Office [phone]  Lynann Beaver 02/20/2019, 10:58 AM

## 2019-02-20 NOTE — Progress Notes (Addendum)
   Subjective: Patient is alert today on exam she reports she is talked with neurology and knows that she has a hematoma in her brain.  She has longstanding hypertension and says it has been hard to control.  Reports her mother had a seizure disorder but she has never had seizures.  Says before the event she remembers trying to ask her daughter to hand her BP meds , and could not get the words out correctly. This was the last things she remembers and does not recall meeting [place] on exam last night.   Objective:  Vital signs in last 24 hours: Vitals:   02/20/19 0405 02/20/19 0500 02/20/19 0840 02/20/19 1157  BP: (!) 171/105 (!) 162/104 (!) 147/99 (!) 147/98  Pulse: 72 78 80 76  Resp: 18  16 16   Temp: 98.7 F (37.1 C)  98.6 F (37 C) 98.6 F (37 C)  TempSrc: Oral  Oral Oral  SpO2: 100%  99% 97%  Weight: 52.4 kg     Height:       Physical Exam Constitutional:      General: She is not in acute distress. Cardiovascular:     Rate and Rhythm: Normal rate and regular rhythm.  Pulmonary:     Effort: Pulmonary effort is normal.     Breath sounds: Normal breath sounds.  Neurological:     General: No focal deficit present.     Mental Status: She is alert. Mental status is at baseline.     Comments: Patient is slow to follow commands, and expresses frustration with her difficulty finding words     Assessment/Plan:  Principal Problem:   Intracranial hematoma (HCC) Active Problems:   Hypertension   ESRD (end stage renal disease) ([place])   Seizure ([place])  43 year old woman with history of hypertension and chronic kidney disease on TTHSat dialysis presents with new onset seizures after dialysis found to have left parietal hematoma with associated vasogenic edema.  #Seizure Left parietal hematoma MRI shows 2-1 cm, consistent with subacute hematoma with mild vasogenic edema. Denies any recent trauma . Patient reports hard to control hypertension. Neurology following and appreciate their  recommendations. - SBP goal < 160  -Keppra 500 mg twice daily - Dexamethasone 4 mg twice daily -Consult neurosurgery  #ESRD Patient on dialysis Tuesdays Thursdays Saturdays.  Creatinine 3.77 no signs for emergent hemodialysis.  #HTN - Coreg Hydralazine and Amlodipine - SBP goal < 160  Dispo: Anticipated discharge in approximately in 2-3 days.  Tamsen Snider, MD PGY1  [phone]

## 2019-02-20 NOTE — Progress Notes (Signed)
CSW met with the patient at bedside and provided advanced directives.   CSW will continue to follow and assist as needed.   Domenic Schwab, MSW, [place] Worker [facility]  [phone]

## 2019-02-21 ENCOUNTER — Inpatient Hospital Stay (HOSPITAL_COMMUNITY): Payer: Medicare Other

## 2019-02-21 DIAGNOSIS — I10 Essential (primary) hypertension: Secondary | ICD-10-CM

## 2019-02-21 DIAGNOSIS — I619 Nontraumatic intracerebral hemorrhage, unspecified: Secondary | ICD-10-CM | POA: Insufficient documentation

## 2019-02-21 DIAGNOSIS — F17200 Nicotine dependence, unspecified, uncomplicated: Secondary | ICD-10-CM

## 2019-02-21 DIAGNOSIS — S06350A Traumatic hemorrhage of left cerebrum without loss of consciousness, initial encounter: Secondary | ICD-10-CM

## 2019-02-21 HISTORY — DX: Nontraumatic intracerebral hemorrhage, unspecified: I61.9

## 2019-02-21 LAB — RENAL FUNCTION PANEL
Albumin: 3.4 g/dL — ABNORMAL LOW (ref 3.5–5.0)
Anion gap: 14 (ref 5–15)
BUN: 44 mg/dL — ABNORMAL HIGH (ref 6–20)
CO2: 24 mmol/L (ref 22–32)
Calcium: 9.1 mg/dL (ref 8.9–10.3)
Chloride: 100 mmol/L (ref 98–111)
Creatinine, Ser: 7.11 mg/dL — ABNORMAL HIGH (ref 0.44–1.00)
GFR calc Af Amer: 8 mL/min — ABNORMAL LOW (ref >60)
GFR calc non Af Amer: 6 mL/min — ABNORMAL LOW (ref >60)
Glucose, Bld: 106 mg/dL — ABNORMAL HIGH (ref 70–99)
Phosphorus: 6.5 mg/dL — ABNORMAL HIGH (ref 2.5–4.6)
Potassium: 4.2 mmol/L (ref 3.5–5.1)
Sodium: 138 mmol/L (ref 135–145)

## 2019-02-21 LAB — CBC
HCT: 33.9 % — ABNORMAL LOW (ref 36.0–46.0)
Hemoglobin: 11.4 g/dL — ABNORMAL LOW (ref 12.0–15.0)
MCH: 33.6 pg (ref 26.0–34.0)
MCHC: 33.6 g/dL (ref 30.0–36.0)
MCV: 100 fL (ref 80.0–100.0)
Platelets: 132 10*3/uL — ABNORMAL LOW (ref 150–400)
RBC: 3.39 MIL/uL — ABNORMAL LOW (ref 3.87–5.11)
RDW: 13.8 % (ref 11.5–15.5)
WBC: 3.2 10*3/uL — ABNORMAL LOW (ref 4.0–10.5)
nRBC: 0 % (ref 0.0–0.2)

## 2019-02-21 LAB — LIPID PANEL
Cholesterol: 233 mg/dL — ABNORMAL HIGH (ref 0–200)
HDL: 85 mg/dL (ref >40)
LDL Cholesterol: 138 mg/dL — ABNORMAL HIGH (ref 0–99)
Total CHOL/HDL Ratio: 2.7 RATIO
Triglycerides: 50 mg/dL (ref <150)
VLDL: 10 mg/dL (ref 0–40)

## 2019-02-21 LAB — HEMOGLOBIN A1C
Hgb A1c MFr Bld: 4.3 % — ABNORMAL LOW (ref 4.8–5.6)
Mean Plasma Glucose: 76.71 mg/dL

## 2019-02-21 LAB — GLUCOSE, CAPILLARY: Glucose-Capillary: 114 mg/dL — ABNORMAL HIGH (ref 70–99)

## 2019-02-21 MED ORDER — LEVETIRACETAM 1000 MG PO TABS: 1000.0000 mg | ORAL_TABLET | Freq: Every day | ORAL | 0 refills | Status: DC

## 2019-02-21 MED ORDER — CHLORHEXIDINE GLUCONATE CLOTH 2 % EX PADS: 6.0000 | MEDICATED_PAD | Freq: Every day | CUTANEOUS | Status: DC

## 2019-02-21 MED ORDER — CALCITRIOL 0.5 MCG PO CAPS: 2.0000 ug | ORAL_CAPSULE | ORAL | 0 refills | Status: AC

## 2019-02-21 MED ORDER — SEVELAMER CARBONATE 800 MG PO TABS: 1600.0000 mg | ORAL_TABLET | Freq: Three times a day (TID) | ORAL | 0 refills | Status: DC

## 2019-02-21 MED ORDER — ATORVASTATIN CALCIUM 10 MG PO TABS
20.0000 mg | ORAL_TABLET | Freq: Every day | ORAL | Status: DC
  Administered 2019-02-21: 20 mg via ORAL
  Filled 2019-02-21: qty 2

## 2019-02-21 MED ORDER — SEVELAMER CARBONATE 800 MG PO TABS
1600.0000 mg | ORAL_TABLET | Freq: Three times a day (TID) | ORAL | Status: DC
  Administered 2019-02-21: 1600 mg via ORAL
  Filled 2019-02-21 (×2): qty 2

## 2019-02-21 MED ORDER — CALCITRIOL 0.25 MCG PO CAPS: 2.0000 ug | ORAL_CAPSULE | ORAL | Status: DC

## 2019-02-21 MED ORDER — ATORVASTATIN CALCIUM 20 MG PO TABS: 20.0000 mg | ORAL_TABLET | Freq: Every day | ORAL | 0 refills | Status: DC

## 2019-02-21 MED ORDER — SPIRONOLACTONE 25 MG PO TABS
50.0000 mg | ORAL_TABLET | Freq: Every day | ORAL | Status: DC
  Administered 2019-02-21: 50 mg via ORAL
  Filled 2019-02-21: qty 2

## 2019-02-21 NOTE — Progress Notes (Signed)
Pt BP SBP >160. MD notified of elevated BP. MD gave orders to give morning BP meds. Goal is to keep SBP <160. Medication administered. Will recheck BP.

## 2019-02-21 NOTE — Plan of Care (Signed)

## 2019-02-21 NOTE — Progress Notes (Signed)
SLP Cancellation Note  Patient Details Name: Madeline Brown MRN: [id number] DOB: 1975/12/18   Cancelled treatment:       Reason Eval/Treat Not Completed: Patient at procedure or test/unavailable(Pt was approached for treatment but has discharge orders and was dressed and fixing her hair when when the SLP arrived. Pt requested that the session be deferred. Pt will benefit from continued SLP services upon discharge.)  [name] I. Hardin Negus, [place], [place] Office number [phone] Pager [place] 02/21/2019, 3:53 PM

## 2019-02-21 NOTE — Consult Note (Signed)
Reason for Consult: Left parietal intracerebral hemorrhage, seizure Referring Physician: Dr. Merleen Nicely Madeline Brown is an 43 y.o. female.  HPI: The patient is a 43 year old black female with end-stage renal disease, hemodialysis.  She had a seizure after her last dialysis.  A CT scan demonstrated a left parietal lesion.  She was further worked up with a brain MRI which demonstrated this lesion was most consistent with an intracerebral hemorrhage.  A neurosurgical consultation has been requested.  Presently the patient is alert and pleasant.  She has not had any more seizures since starting Keppra.  She feels fine.  She denies headaches, nausea, vomiting, trauma, anticoagulation, etc.  She wants to go home.  Past Medical History:  Diagnosis Date  . Anemia   . Chronic kidney disease   . Hypertension     Past Surgical History:  Procedure Laterality Date  . AV FISTULA PLACEMENT Left 06/24/2017   Procedure: CREATION OF LEFT ARM BRACHIALCEPHALIC  ARTERIOVENOUS (AV) FISTULA;  Surgeon: Rosetta Posner, MD;  Location: [facility];  Service: Vascular;  Laterality: Left;  . CESAREAN SECTION     x2  . FISTULA SUPERFICIALIZATION Left 10/28/2017   Procedure: SUPERFICIALIZATION LEFT BRACHIOCEPHALIC ARTERIOVENOUS FISTULA;  Surgeon: Rosetta Posner, MD;  Location: [facility];  Service: Vascular;  Laterality: Left;  . INSERTION OF DIALYSIS CATHETER Right 06/24/2017   Procedure: INSERTION OF TUNNELED DIALYSIS CATHETER;  Surgeon: Rosetta Posner, MD;  Location: [facility];  Service: Vascular;  Laterality: Right;  . LIGATION OF COMPETING BRANCHES OF ARTERIOVENOUS FISTULA  10/28/2017   Procedure: LIGATION OF COMPETING BRANCHES OF ARTERIOVENOUS FISTULA x3;  Surgeon: Rosetta Posner, MD;  Location[facility];  Service: Vascular;;    Family History  Problem Relation Age of Onset  . Diabetes Father   . Hypertension Father   . Cancer Father   . Heart disease Father     Social History:  reports that she has been smoking cigarettes. She has  a 3.75 pack-year smoking history. She has never used smokeless tobacco. She reports current alcohol use. She reports current drug use. Drug: Marijuana.  Allergies:  Allergies  Allergen Reactions  . Visine [Tetrahydrozoline Hcl] Swelling    Eyes Swelling.    Medications:  I have reviewed the patient's current medications. Prior to Admission:  Medications Prior to Admission  Medication Sig Dispense Refill Last Dose  . amLODipine (NORVASC) 10 MG tablet Take 10 mg by mouth at bedtime.     . calcitRIOL (ROCALTROL) 0.25 MCG capsule Take 1 capsule (0.25 mcg total) by mouth Every Tuesday,Thursday,and Saturday with dialysis. 30 capsule 0   . carvedilol (COREG) 25 MG tablet Take 1 tablet (25 mg total) by mouth 2 (two) times daily with a meal. 60 tablet 0 maybe 9/5 at 11:15am  . hydrALAZINE (APRESOLINE) 100 MG tablet Take 100 mg by mouth 2 (two) times daily.     . hydrALAZINE (APRESOLINE) 25 MG tablet Take 1 tablet (25 mg total) by mouth every 8 (eight) hours. (Patient not taking: Reported on 02/19/2019) 90 tablet 0 Not Taking at Unknown time  . lidocaine-prilocaine (EMLA) cream Apply 1 application topically See admin instructions. Apply small amount to access site one hour before dialysis (Tuesday, Thursday, Saturday). Cover with occlusive dressing (Saran wrap)     . multivitamin (RENA-VIT) TABS tablet Take 1 tablet by mouth at bedtime. 30 tablet 0   . spironolactone (ALDACTONE) 50 MG tablet Take 50 mg by mouth daily.      Scheduled: . amLODipine  10 mg Oral QHS  . carvedilol  25 mg Oral BID WC  . hydrALAZINE  100 mg Oral BID  . levETIRAcetam  1,000 mg Oral Daily   Continuous:  TIR:[id number] **OR** acetaminophen Anti-infectives (From admission, onward)   None       Results for orders placed or performed during the hospital encounter of 02/19/19 (from the past 48 hour(s))  Renal function panel     Status: Abnormal   Collection Time: 02/19/19  2:35 PM  Result Value Ref Range   Sodium  136 135 - 145 mmol/L   Potassium 3.9 3.5 - 5.1 mmol/L   Chloride 92 (L) 98 - 111 mmol/L   CO2 30 22 - 32 mmol/L   Glucose, Bld 85 70 - 99 mg/dL   BUN 16 6 - 20 mg/dL   Creatinine, Ser 3.77 (H) 0.44 - 1.00 mg/dL   Calcium 8.7 (L) 8.9 - 10.3 mg/dL   Phosphorus 4.3 2.5 - 4.6 mg/dL   Albumin 3.8 3.5 - 5.0 g/dL   GFR calc non Af Amer 14 (L) >60 mL/min   GFR calc Af Amer 16 (L) >60 mL/min   Anion gap 14 5 - 15    Comment: Performed at [facility], [address], [place], [place] [address]  CBC     Status: Abnormal   Collection Time: 02/19/19  2:35 PM  Result Value Ref Range   WBC 5.1 4.0 - 10.5 K/uL   RBC 3.57 (L) 3.87 - 5.11 MIL/uL   Hemoglobin 11.9 (L) 12.0 - 15.0 g/dL   HCT 35.6 (L) 36.0 - 46.0 %   MCV 99.7 80.0 - 100.0 fL   MCH 33.3 26.0 - 34.0 pg   MCHC 33.4 30.0 - 36.0 g/dL   RDW 13.9 11.5 - 15.5 %   Platelets 126 (L) 150 - 400 K/uL   nRBC 0.0 0.0 - 0.2 %    Comment: Performed at [facility], [place] [address], [place], [place] [address]  Troponin I (High Sensitivity)     Status: Abnormal   Collection Time: 02/19/19  2:35 PM  Result Value Ref Range   Troponin I (High Sensitivity) 46 (H) <18 ng/L    Comment: Performed at [facility] [address], [place], [place] [address]  Lactic acid, plasma     Status: Abnormal   Collection Time: 02/19/19  2:35 PM  Result Value Ref Range   Lactic Acid, Venous 3.3 (HH) 0.5 - 1.9 mmol/L    Comment: CRITICAL RESULT CALLED TO, READ BACK BY AND VERIFIED WITH: BOHBI, N RN @ [id number] ON 02/19/2019 BY TEMOCHE, H Performed at [facility], [place] [address], [place], [place] [address]   Salicylate level     Status: None   Collection Time: 02/19/19  2:35 PM  Result Value Ref Range   Salicylate Lvl <3.9 2.8 - 30.0 mg/dL    Comment: Performed at [facility] [address], [place], [place] [address]  Magnesium     Status: None   Collection Time: 02/19/19  2:35 PM  Result Value Ref Range   Magnesium 2.3 1.7 - 2.4  mg/dL    Comment: Performed at [facility], [place] [address], [place], [place] [address]  Urine culture     Status: Abnormal   Collection Time: 02/19/19  3:38 PM   Specimen: Urine, Random  Result Value Ref Range   Specimen Description URINE, RANDOM    Special Requests  NONE Performed at [facility], [place] [address] [place], [place] [address]    Culture (A)     40,000 COLONIES/mL MULTIPLE SPECIES PRESENT, SUGGEST RECOLLECTION   Report Status 02/20/2019 FINAL   Urinalysis, Routine w reflex microscopic     Status: Abnormal   Collection Time: 02/19/19  3:45 PM  Result Value Ref Range   Color, Urine YELLOW YELLOW   APPearance CLOUDY (A) CLEAR   Specific Gravity, Urine 1.011 1.005 - 1.030   pH 9.0 (H) 5.0 - 8.0   Glucose, UA NEGATIVE NEGATIVE mg/dL   Hgb urine dipstick NEGATIVE NEGATIVE   Bilirubin Urine NEGATIVE NEGATIVE   Ketones, ur NEGATIVE NEGATIVE mg/dL   Protein, ur >=300 (A) NEGATIVE mg/dL   Nitrite NEGATIVE NEGATIVE   Leukocytes,Ua NEGATIVE NEGATIVE   RBC / HPF 0-5 0 - 5 RBC/hpf   WBC, UA 11-20 0 - 5 WBC/hpf   Bacteria, UA RARE (A) NONE SEEN   Squamous Epithelial / LPF 21-50 0 - 5   Mucus PRESENT    Hyaline Casts, UA PRESENT     Comment: Performed at [facility], [address], [place], [place] [address]  Urine rapid drug screen (hosp performed)     Status: Abnormal   Collection Time: 02/19/19  3:45 PM  Result Value Ref Range   Opiates NONE DETECTED NONE DETECTED   Cocaine NONE DETECTED NONE DETECTED   Benzodiazepines POSITIVE (A) NONE DETECTED   Amphetamines NONE DETECTED NONE DETECTED   Tetrahydrocannabinol POSITIVE (A) NONE DETECTED   Barbiturates NONE DETECTED NONE DETECTED    Comment: Performed at [facility], [place] [address] [place], [place] [address]  SARS Coronavirus 2 [facility] order, Performed in [facility] hospital lab) Nasopharyngeal Nasopharyngeal Swab     Status: None   Collection Time: 02/19/19  4:46 PM   Specimen:  Nasopharyngeal Swab  Result Value Ref Range   SARS Coronavirus 2 NEGATIVE NEGATIVE    Comment: (NOTE) If result is NEGATIVE SARS-CoV-2 target nucleic acids are NOT DETECTED. The SARS-CoV-2 RNA is generally detectable in upper and lower  respiratory specimens during the acute phase of infection. The lowest  concentration of SARS-CoV-2 viral copies this assay can detect is 250  copies / mL. A negative result does not preclude SARS-CoV-2 infection  and should not be used as the sole basis for treatment or other  patient management decisions.  A negative result may occur with  improper specimen collection / handling, submission of specimen other  than nasopharyngeal swab, presence of viral mutation(s) within the  areas targeted by this assay, and inadequate number of viral copies  (<250 copies / mL). A negative result must be combined with clinical  observations, patient history, and epidemiological information. If result is POSITIVE SARS-CoV-2 target nucleic acids are DETECTED. The SARS-CoV-2 RNA is generally detectable in upper and lower  respiratory specimens dur ing the acute phase of infection.  Positive  results are indicative of active infection with SARS-CoV-2.  Clinical  correlation with patient history and other diagnostic information is  necessary to determine patient infection status.  Positive results do  not rule out bacterial infection or co-infection with other viruses. If result is PRESUMPTIVE POSTIVE SARS-CoV-2 nucleic acids MAY BE PRESENT.   A presumptive positive result was obtained on the submitted specimen  and confirmed on repeat testing.  While 2019 novel coronavirus  (SARS-CoV-2) nucleic acids may be present in the submitted sample  additional confirmatory testing may be necessary for  epidemiological  and / or clinical management purposes  to differentiate between  SARS-CoV-2 and other Sarbecovirus currently known to infect humans.  If clinically indicated  additional testing with an alternate test  methodology [phone]) is advised. The SARS-CoV-2 RNA is generally  detectable in upper and lower respiratory sp ecimens during the acute  phase of infection. The expected result is Negative. Fact Sheet for Patients:  StrictlyIdeas.no Fact Sheet for Healthcare Providers: BankingDealers.co.za This test is not yet approved or cleared by the [place] FDA and has been authorized for detection and/or diagnosis of SARS-CoV-2 by FDA under an Emergency Use Authorization (EUA).  This EUA will remain in effect (meaning this test can be used) for the duration of the COVID-19 declaration under Section 564(b)(1) of the Act, 21 U.S.C. section 360bbb-3(b)(1), unless the authorization is terminated or revoked sooner. Performed at [facility], [place] [address], [place], [place] [address]   Lactic acid, plasma     Status: None   Collection Time: 02/19/19  6:44 PM  Result Value Ref Range   Lactic Acid, Venous 1.0 0.5 - 1.9 mmol/L    Comment: Performed at [facility] [address], [place], [place] [address]  Troponin I (High Sensitivity)     Status: Abnormal   Collection Time: 02/19/19  6:44 PM  Result Value Ref Range   Troponin I (High Sensitivity) 89 (H) <18 ng/L    Comment: RESULT CALLED TO, READ BACK BY AND VERIFIED WITH: [place], E RN @ 2019 ON 02/19/2019 BY TEMOCHE, H Performed at [facility], [place] [address], [place], [place] [address]   CBC     Status: Abnormal   Collection Time: 02/20/19  3:17 AM  Result Value Ref Range   WBC 4.8 4.0 - 10.5 K/uL   RBC 3.36 (L) 3.87 - 5.11 MIL/uL   Hemoglobin 11.1 (L) 12.0 - 15.0 g/dL   HCT 33.4 (L) 36.0 - 46.0 %   MCV 99.4 80.0 - 100.0 fL   MCH 33.0 26.0 - 34.0 pg   MCHC 33.2 30.0 - 36.0 g/dL   RDW 13.9 11.5 - 15.5 %   Platelets 120 (L) 150 - 400 K/uL   nRBC 0.0 0.0 - 0.2 %    Comment: Performed at [facility], [place] [address]  [place], [place] [address]  Troponin I (High Sensitivity)     Status: Abnormal   Collection Time: 02/20/19  3:17 AM  Result Value Ref Range   Troponin I (High Sensitivity) 67 (H) <18 ng/L    Comment: DELTA CHECK NOTED (NOTE) Elevated high sensitivity troponin I (hsTnI) values and significant  changes across serial measurements may suggest ACS but many other  chronic and acute conditions are known to elevate hsTnI results.  Refer to the Links section for chest pain algorithms and additional  guidance. Performed at [facility], [place] [address] [place], [place] [address]   Glucose, capillary     Status: None   Collection Time: 02/20/19  6:18 AM  Result Value Ref Range   Glucose-Capillary 76 70 - 99 mg/dL   Comment 1 Notify RN    Comment 2 Document in Chart   CBC     Status: Abnormal   Collection Time: 02/21/19  4:25 AM  Result Value Ref Range   WBC 3.2 (L) 4.0 - 10.5 K/uL   RBC 3.39 (L) 3.87 - 5.11 MIL/uL   Hemoglobin 11.4 (L) 12.0 - 15.0 g/dL   HCT  33.9 (L) 36.0 - 46.0 %   MCV 100.0 80.0 - 100.0 fL   MCH 33.6 26.0 - 34.0 pg   MCHC 33.6 30.0 - 36.0 g/dL   RDW 13.8 11.5 - 15.5 %   Platelets 132 (L) 150 - 400 K/uL   nRBC 0.0 0.0 - 0.2 %    Comment: Performed at [facility] [address], [place], [place] [address]  Renal function panel     Status: Abnormal   Collection Time: 02/21/19  4:25 AM  Result Value Ref Range   Sodium 138 135 - 145 mmol/L   Potassium 4.2 3.5 - 5.1 mmol/L   Chloride 100 98 - 111 mmol/L   CO2 24 22 - 32 mmol/L   Glucose, Bld 106 (H) 70 - 99 mg/dL   BUN 44 (H) 6 - 20 mg/dL   Creatinine, Ser 7.11 (H) 0.44 - 1.00 mg/dL    Comment: DELTA CHECK NOTED   Calcium 9.1 8.9 - 10.3 mg/dL   Phosphorus 6.5 (H) 2.5 - 4.6 mg/dL   Albumin 3.4 (L) 3.5 - 5.0 g/dL   GFR calc non Af Amer 6 (L) >60 mL/min   GFR calc Af Amer 8 (L) >60 mL/min   Anion gap 14 5 - 15    Comment: Performed at [facility] [address], [place], [place] [address]  Lipid  panel     Status: Abnormal   Collection Time: 02/21/19  4:25 AM  Result Value Ref Range   Cholesterol 233 (H) 0 - 200 mg/dL   Triglycerides 50 <150 mg/dL   HDL 85 >40 mg/dL   Total CHOL/HDL Ratio 2.7 RATIO   VLDL 10 0 - 40 mg/dL   LDL Cholesterol 138 (H) 0 - 99 mg/dL    Comment:        Total Cholesterol/HDL:CHD Risk Coronary Heart Disease Risk Table                     Men   Women  1/2 Average Risk   3.4   3.3  Average Risk       5.0   4.4  2 X Average Risk   9.6   7.1  3 X Average Risk  23.4   11.0        Use the calculated Patient Ratio above and the CHD Risk Table to determine the patient's CHD Risk.        ATP III CLASSIFICATION (LDL):  <100     mg/dL   Optimal  100-129  mg/dL   Near or Above                    Optimal  130-159  mg/dL   Borderline  160-189  mg/dL   High  >190     mg/dL   Very High Performed at [facility] [address], [place], [place] [address]   Hemoglobin A1c     Status: Abnormal   Collection Time: 02/21/19  4:25 AM  Result Value Ref Range   Hgb A1c MFr Bld 4.3 (L) 4.8 - 5.6 %    Comment: (NOTE) Pre diabetes:          5.7%-6.4% Diabetes:              >6.4% Glycemic control for   <7.0% adults with diabetes    Mean Plasma Glucose 76.71 mg/dL    Comment: Performed at [facility]  [address], [place], [place] [address]  Glucose, capillary     Status: Abnormal   Collection Time: 02/21/19  6:33 AM  Result Value Ref Range   Glucose-Capillary 114 (H) 70 - 99 mg/dL    Ct Head Wo Contrast  Result Date: 02/19/2019 CLINICAL DATA:  Seizure following dialysis. No history of seizures. EXAM: CT HEAD WITHOUT CONTRAST TECHNIQUE: Contiguous axial images were obtained from the base of the skull through the vertex without intravenous contrast. COMPARISON:  None. FINDINGS: Brain: Oval low density the mass with peripheral rim of medium density in the left parietal region. This measures 2.1 cm in maximum diameter on image number 20 series 3.  There is associated vasogenic edema in surrounding white matter. No significant mass effect is seen. Mildly enlarged ventricles and cortical sulci. Minimal patchy white matter low density in both cerebral hemispheres. No intracranial hemorrhage. Vascular: No hyperdense vessel or unexpected calcification. Skull: Normal. Negative for fracture or focal lesion. Sinuses/Orbits: Unremarkable. Other: None. IMPRESSION: 1. 2.1 cm left parietal mass with a peripheral rim of medium density and associated vasogenic edema. Differential considerations include primary brain malignancy, metastasis and abscess. Further evaluation with pre and postcontrast MRI of the brain is recommended. 2. No intracranial hemorrhage. 3. Mild diffuse cerebral and cerebellar atrophy. 4. Minimal chronic small vessel white matter ischemic changes in both cerebral hemispheres. Electronically Signed   By: Claudie Revering M.D.   On: 02/19/2019 14:07   Mr Angio Head Wo Contrast  Result Date: 02/20/2019 CLINICAL DATA:  Left parietal lobe hematoma. Intracranial hemorrhage. EXAM: MRA HEAD WITHOUT CONTRAST TECHNIQUE: Angiographic images of the Circle of Willis were obtained using MRA technique without intravenous contrast. COMPARISON:  None. FINDINGS: Moderate tortuosity is present in the cervical right internal carotid artery. There is signal loss at the skull base of the vertebral arteries bilaterally. Mild narrowing is present at the cavernous internal carotid arteries without a significant stenosis relative to the more distal vessel. The ICA termini are normal. The A1 and M1 segments are normal. ACA and MCA branch vessels are normal. The right vertebral artery is dominant. There is signal loss in the distal left vertebral artery suggesting high-grade stenosis or occlusion. The basilar artery is normal. Both posterior cerebral arteries originate from the basilar tip. PCA branch vessels are unremarkable. IMPRESSION: 1. Tortuous distal right internal carotid  artery. This likely reflects sequela of chronic hypertension. 2. No focal vascular lesion to explain the patient's distal hemorrhage. 3. 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 Electronically Signed   By: San Morelle M.D.   On: 02/20/2019 20:21   Mr Brain Wo Contrast  Result Date: 02/20/2019 CLINICAL DATA:  Seizure following dialysis. EXAM: MRI HEAD WITHOUT CONTRAST TECHNIQUE: Multiplanar, multiecho pulse sequences of the brain and surrounding structures were obtained without intravenous contrast. COMPARISON:  Head CT from yesterday FINDINGS: Brain: The left low parietal mass is T1 hyperintense, ovoid, and subcortical - measuring up to 20 x 13 mm on sagittal T1 weighted imaging. There is mild adjacent edema. No suspected adjacent mass. No regional infarct is seen. There have been remote micro hemorrhages in the bilateral posterior circulation - both cerebral, thalamic, and infratentorial. Remote lacunar infarcts in the bilateral pons. Mild periventricular white matter disease, likely microvascular ischemia. 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 Normal venous flow voids including about the hematoma. Skull and upper cervical spine: No focal marrow lesion. Low marrow signal correlating with history of anemia. Sinuses/Orbits: Negative.  IMPRESSION: The 20 x 13 mm left parietal mass is a subacute hematoma with mild vasogenic edema. There have been multiple remote hemorrhages and lacunar infarcts in the posterior circulation likely related to history of hypertension. No evidence of underlying mass, infarct, or venous occlusion - consider follow-up noncontrast brain MRI or head CT with contrast given history of end-stage renal disease. Electronically Signed   By: Monte Fantasia M.D.   On: 02/20/2019 08:04    ROS: As above Blood pressure (!) 144/95, pulse 81, temperature 36.7 C, resp. rate 18, height 5'  (1.524 m), weight 52.5 kg, SpO2 97 %, unknown if currently breastfeeding. Estimated body mass index is 22.6 kg/m as calculated from the following:   Height as of this encounter: 5' (1.524 m).   Weight as of this encounter: 52.5 kg.  Physical Exam  General: An alert and pleasant 43 year old black female in no apparent distress.  HEENT: Normocephalic, atraumatic, pupils equal round reactive light, extraocular muscles are intact.  Neck: Supple, normal range of motion, Spurling's testing is negative bilaterally.  Thorax: Symmetric  Abdomen: Soft  Neurologic exam: The patient is alert and oriented x3.  Cranial nerves II through XII were examined bilaterally and grossly normal.  Vision and hearing are grossly normal bilaterally.  Her motor strength is 5/5 in her bilateral bicep, handgrip, gastrocnemius, and dorsiflexors.  Sensory function is intact to light touch sensation in all tested dermatomes bilaterally.  Cerebellar function is intact to rapid alternating movements of the upper extremities bilaterally.  I have reviewed the patient's head CT performed at [facility].  It demonstrates a left parietal intraparenchymal lesion without significant mass-effect.  I have also reviewed the patient's brain MRI performed yesterday at [facility]  She has a small intraparenchymal hemorrhage without significant mass-effect.  I do not see any obvious tumor.  Assessment/Plan: Left parietal hemorrhage, seizure: I have discussed the situation with the patient.  I recommend that she continue her Keppra for the time being.  She not need surgery.  This will likely resolve without further intervention.  I think it is okay for her to be discharged and follow-up with me in the office in about a month.  We will plan to repeat her MRI scan to make sure this lesion resolves.  Please have her follow-up with me in the office and call if I can be of further assistance.  Ophelia Charter 02/21/2019,  9:29 AM

## 2019-02-21 NOTE — Discharge Instructions (Signed)
Madeline Brown,   It has been a pleasure working with you and we are glad you're feeling better. You were hospitalized for new onset seizures. You were found to have an area of bleeding in your brain which is likely the cause of the seizure. This may be caused by your high blood pressure. You were started on Keppra for your seizures and you have been seizure free. You will need follow up with neurology to repeat imaging and have further testing done to find out why you are bleeding. Neurosurgery evaluated you and did not think any surgery was needed.   We have made the following changes to your medications,  START taking Keppra 1000 mg daily START taking Lipitor 20 mg daily   Follow up with your primary care provider in 1-2 weeks Follow up with neurology in 2- 4 weeks   If your symptoms worsen or you develop new symptoms, please seek medical help whether it is your primary care provider or emergency department.

## 2019-02-21 NOTE — Progress Notes (Addendum)
   Subjective:    Patient resting in bed on approach. She denies any changes since yesterday. Continues to have problems with word finding. Discussed blood pressure regimen she has been on outpatient.   Objective:  Vital signs in last 24 hours: Vitals:   02/21/19 0105 02/21/19 0107 02/21/19 0113 02/21/19 0347  BP:    (!) 146/101  Pulse:    83  Resp: (!) 41 (!) 39 18 18  Temp:    98.6 F (37 C)  TempSrc:    Oral  SpO2:    100%  Weight:    52.5 kg  Height:       Physical Exam Constitutional:      General: She is not in acute distress. Cardiovascular:     Rate and Rhythm: Normal rate and regular rhythm.  Pulmonary:     Effort: Pulmonary effort is normal.     Breath sounds: Normal breath sounds.  Neurological:     General: No focal deficit present.     Mental Status: She is alert. Mental status is at baseline.     Comments: Word finding difficulty still present.     Assessment/Plan:  Principal Problem:   Intracranial hematoma (HCC) Active Problems:   Hypertension   ESRD (end stage renal disease) ([place])   Seizure ([place])  43 year old woman with history of hypertension and chronic kidney disease on TTHSat dialysis presents with new onset seizures after dialysis found to have left parietal hematoma with associated vasogenic edema.  #Seizure Left parietal hematoma MRI shows 2-1 cm, consistent with subacute hematoma with mild vasogenic edema. Denies any recent trauma . Patient reports hard to control hypertension, and a spontaneous bleed from the hypertension is most likely etiology. Neurology following and appreciate their recommendations. Neurosurgery consulted and do not recommend surgery at this time. We will recommend follow up with Neurology and they will repeat MRI at this time.  - SBP goal < 160  - Keppra 500 mg twice daily - discharge today with follow-up  #HLD LDL elevated -Lipitor 20mg   #HTN - Coreg Hydralazine Spirolactone and Amlodipine.  - SBP goal < 160   #ESRD Patient on dialysis Tuesdays Thursdays Saturdays.  Creatinine 3.77 no signs for emergent hemodialysis. Appreciate nephrology consulting and recommendations.   Dispo: Anticipated discharge in approximately in 2-3 days.  Tamsen Snider, MD PGY1  [phone]

## 2019-02-21 NOTE — Evaluation (Signed)
Occupational Therapy Evaluation Patient Details Name: Madeline Brown MRN: [id number] DOB: [birth year] Today's Date: 02/21/2019    History of Present Illness Pt is a 43 y/o F admitted on 02/19/19 & found to have L parietal lesion most consistent with intracereberal hemorrhage. No seizure activity since admission. Pt with PMH significant for HTN & ESRD on hemodialysis (TTS).   Clinical Impression   Patient evaluated by Occupational Therapy with no further acute OT needs identified. All education has been completed and the patient has no further questions. See below for any follow-up Occupational Therapy or equipment needs. OT to sign off. Thank you for referral.      Follow Up Recommendations  No OT follow up    Equipment Recommendations  None recommended by OT    Recommendations for Other Services       Precautions / Restrictions Precautions Precautions: Fall Restrictions Weight Bearing Restrictions: No      Mobility Bed Mobility Overal bed mobility: Independent                Transfers Overall transfer level: Independent                    Balance Overall balance assessment: Needs assistance         Standing balance support: No upper extremity supported Standing balance-Leahy Scale: Good Standing balance comment: 1 LOB when increasing BLE step length, otherwise no LOB noted                           ADL either performed or assessed with clinical judgement   ADL Overall ADL's : Independent                                       General ADL Comments: able to don shoes and fully dressed on arrival. pt able to get in the bottom of the tub by lifting herself with bil UE off the ground and crossing her legs in the air then slowly descending to sitting int he tub. pt able to complete higher balance changes with lob with widen gait. pt requires max (A) to correct LOB     Vision         Perception     Praxis      Pertinent  Vitals/Pain Pain Assessment: No/denies pain     Hand Dominance Right   Extremity/Trunk Assessment Upper Extremity Assessment Upper Extremity Assessment: Overall WFL for tasks assessed   Lower Extremity Assessment Lower Extremity Assessment: Defer to PT evaluation   Cervical / Trunk Assessment Cervical / Trunk Assessment: Normal   Communication Communication Communication: No difficulties   Cognition Arousal/Alertness: Awake/alert Behavior During Therapy: WFL for tasks assessed/performed Overall Cognitive Status: Within Functional Limits for tasks assessed                                 General Comments: pt able to engage in dual task (ambulating + cognitive task)   General Comments       Exercises     Shoulder Instructions      Home Living Family/patient expects to be discharged to:: Private residence Living Arrangements: Children;Other relatives(boyfriend) Available Help at Discharge: Family Type of Home: House Home Access: Stairs to enter [employer] of Steps: 1 Entrance Stairs-Rails: None Home Layout: Two level Alternate  Level Stairs-Number of Steps: 14 Alternate Level Stairs-Rails: Left Bathroom Shower/Tub: Teacher, early years/pre: Standard         Additional Comments: lives with 3 daughters and boyfriend. pt has a grown son in the area that helps as well.   Lives With: Family    Prior Functioning/Environment Level of Independence: Independent                 OT Problem List:        OT Treatment/Interventions:      OT Goals(Current goals can be found in the care plan section) Acute Rehab OT Goals Patient Stated Goal: to know when to take seizure medication  OT Frequency:     Barriers to D/C:            Co-evaluation PT/OT/SLP Co-Evaluation/Treatment: Yes Reason for Co-Treatment: To address functional/ADL transfers PT goals addressed during session: Mobility/safety with mobility;Balance OT goals  addressed during session: ADL's and self-care      AM-PAC OT "6 Clicks" Daily Activity     Outcome Measure Help from another person eating meals?: None Help from another person taking care of personal grooming?: None Help from another person toileting, which includes using toliet, bedpan, or urinal?: None Help from another person bathing (including washing, rinsing, drying)?: None Help from another person to put on and taking off regular upper body clothing?: None Help from another person to put on and taking off regular lower body clothing?: None 6 Click Score: 24   End of Session Nurse Communication: Mobility status;Precautions  Activity Tolerance: Patient tolerated treatment well Patient left: in chair;with call bell/phone within reach  OT Visit Diagnosis: Unsteadiness on feet (R26.81)                Time: 3875-6433 OT Time Calculation (min): 12 min Charges:  OT General Charges $OT Visit: 1 Visit OT Evaluation $OT Eval Low Complexity: 1 Low   Jeri Modena, OTR/L  Acute Rehabilitation Services Pager: [phone] Office: [phone] .   Jeri Modena 02/21/2019, 3:22 PM

## 2019-02-21 NOTE — Progress Notes (Signed)
Carotid duplex has been completed.   Preliminary results in CV Proc.   Abram Sander 02/21/2019 10:28 AM

## 2019-02-21 NOTE — Evaluation (Addendum)
Physical Therapy Evaluation Patient Details Name: Madeline Brown MRN: [id number] DOB: Feb 19, 1976 Today's Date: 02/21/2019   History of Present Illness  Pt is a 43 y/o F admitted on 02/19/19 & found to have L parietal lesion most consistent with intracereberal hemorrhage. No seizure activity since admission. Pt with PMH significant for HTN & ESRD on hemodialysis (TTS).  Clinical Impression  Pt is doing well overall with functional mobility as she is able to ambulate long distances & negotiate stairs with L rail without AD & supervision. Pt does experience LOB when increasing step length with assistance to correct. Pt asking appropriate questions re: d/c home & medications - RN made aware. Pt would benefit from HHPT f/u to focus on balance and reduce fall risk upon return home.     Follow Up Recommendations Home health PT    Equipment Recommendations  None recommended by PT    Recommendations for Other Services       Precautions / Restrictions Precautions Precautions: Fall Restrictions Weight Bearing Restrictions: No      Mobility  Bed Mobility Overal bed mobility: Independent                Transfers Overall transfer level: Independent                  Ambulation/Gait Ambulation/Gait assistance: Supervision Gait Distance (Feet): 150 Feet Assistive device: None       General Gait Details: slight lateral sway  Stairs Stairs: Yes Stairs assistance: Supervision Stair Management: One rail Left Number of Stairs: 5(6" + 3")    Wheelchair Mobility    Modified Rankin (Stroke Patients Only) Modified Rankin (Stroke Patients Only) Pre-Morbid Rankin Score: No symptoms Modified Rankin: Moderately severe disability     Balance Overall balance assessment: Needs assistance         Standing balance support: No upper extremity supported Standing balance-Leahy Scale: Good Standing balance comment: 1 LOB when increasing BLE step length, otherwise no LOB noted                              Pertinent Vitals/Pain Pain Assessment: No/denies pain    Home Living Family/patient expects to be discharged to:: Private residence Living Arrangements: Children;Other relatives(boyfriend) Available Help at Discharge: Family Type of Home: House Home Access: Stairs to enter Entrance Stairs-Rails: None Entrance Stairs-Number of Steps: 1 Home Layout: Two level        Prior Function Level of Independence: Independent               Hand Dominance        Extremity/Trunk Assessment        Lower Extremity Assessment Lower Extremity Assessment: Overall WFL for tasks assessed       Communication   Communication: No difficulties  Cognition Arousal/Alertness: Awake/alert Behavior During Therapy: WFL for tasks assessed/performed Overall Cognitive Status: Within Functional Limits for tasks assessed                                 General Comments: pt able to engage in dual task (ambulating + cognitive task)      General Comments      Exercises     Assessment/Plan    PT Assessment Patient needs continued PT services  PT Problem List Decreased mobility;Decreased balance       PT Treatment Interventions Therapeutic exercise;Gait training;Balance training;Stair training;Neuromuscular re-education;Functional mobility  training;Therapeutic activities;Cognitive remediation    PT Goals (Current goals can be found in the Care Plan section)  Acute Rehab PT Goals Patient Stated Goal: to go home PT Goal Formulation: With patient Time For Goal Achievement: 02/21/19 Potential to Achieve Goals: Good    Frequency Min 3X/week   Barriers to discharge        Co-evaluation PT/OT/SLP Co-Evaluation/Treatment: Yes Reason for Co-Treatment: To address functional/ADL transfers PT goals addressed during session: Mobility/safety with mobility;Balance         AM-PAC PT "6 Clicks" Mobility  Outcome Measure Help needed  turning from your back to your side while in a flat bed without using bedrails?: None Help needed moving from lying on your back to sitting on the side of a flat bed without using bedrails?: None Help needed moving to and from a bed to a chair (including a wheelchair)?: None Help needed standing up from a chair using your arms (e.g., wheelchair or bedside chair)?: None Help needed to walk in hospital room?: None Help needed climbing 3-5 steps with a railing? : A Little 6 Click Score: 23    End of Session   Activity Tolerance: Patient tolerated treatment well Patient left: (sitting EOB) Nurse Communication: Mobility status PT Visit Diagnosis: Unsteadiness on feet (R26.81)    Time: 7412-8786 PT Time Calculation (min) (ACUTE ONLY): 11 min   Charges:   PT Evaluation $PT Eval Low Complexity: [place], PT, DPT 02/21/2019, 3:18 PM

## 2019-02-21 NOTE — TOC Transition Note (Signed)
Transition of Care [facility]) - CM/SW Discharge Note   Patient Details  Name: Madeline Brown MRN: [id number] Date of Birth: 1975/08/15  [facility] CM/SW Contact:  Pollie Friar, RN Phone Number: 02/21/2019, 5:03 PM   Clinical Narrative:    Pt discharging home with orders for [facility] services. [facility] selected. Will update Tiffany with [facility] in am.  No DME needs.  Pt has a new PCP appt on Wednesday. Will update the [facility] agency.  Pt has transportation home.    Final next level of care: Home w Home Health Services Barriers to Discharge: No Barriers Identified   Patient Goals and CMS Choice   CMS Medicare.gov Compare Post Acute Care list provided to:: Patient Choice offered to / list presented to : Patient  Discharge Placement                       Discharge Plan and Services                          HH Arranged: PT, Speech Therapy HH Agency: [facility] ([facility] Date [place]: 02/21/19      Social Determinants of Health ([place]) Interventions     Readmission Risk Interventions No flowsheet data found.

## 2019-02-21 NOTE — Progress Notes (Signed)
STROKE TEAM PROGRESS NOTE   INTERVAL HISTORY Pt sitting in bed, pleasant, no recurrent seizure. Informed that her left brain ICH likely happened 2-4 weeks ago, she was surprised as she was asymptomatic. She will follow up in [place] for repeat imaging in 4 weeks.   Vitals:   02/21/19 0347 02/21/19 0803 02/21/19 0900 02/21/19 1253  BP: (!) 146/101 (!) 178/108 (!) 144/95 (!) 152/96  Pulse: 83 81  78  Resp: 18 18  18   Temp: 98.6 F (37 C) 98 F (36.7 C)  98.8 F (37.1 C)  TempSrc: Oral   Oral  SpO2: 100% 97%  96%  Weight: 52.5 kg     Height:        CBC:  Recent Labs  Lab 02/20/19 0317 02/21/19 0425  WBC 4.8 3.2*  HGB 11.1* 11.4*  HCT 33.4* 33.9*  MCV 99.4 100.0  PLT 120* 132*    Basic Metabolic Panel:  Recent Labs  Lab 02/19/19 1435 02/21/19 0425  NA 136 138  K 3.9 4.2  CL 92* 100  CO2 30 24  GLUCOSE 85 106*  BUN 16 44*  CREATININE 3.77* 7.11*  CALCIUM 8.7* 9.1  MG 2.3  --   PHOS 4.3 6.5*   Lipid Panel:     Component Value Date/Time   CHOL 233 (H) 02/21/2019 0425   TRIG 50 02/21/2019 0425   HDL 85 02/21/2019 0425   CHOLHDL 2.7 02/21/2019 0425   VLDL 10 02/21/2019 0425   LDLCALC 138 (H) 02/21/2019 0425   HgbA1c:  Lab Results  Component Value Date   HGBA1C 4.3 (L) 02/21/2019   Urine Drug Screen:     Component Value Date/Time   LABOPIA NONE DETECTED 02/19/2019 1545   COCAINSCRNUR NONE DETECTED 02/19/2019 1545   LABBENZ POSITIVE (A) 02/19/2019 1545   AMPHETMU NONE DETECTED 02/19/2019 1545   THCU POSITIVE (A) 02/19/2019 1545   LABBARB NONE DETECTED 02/19/2019 1545    Alcohol Level No results found for: ETH  IMAGING Ct Head Wo Contrast  Result Date: 02/19/2019 CLINICAL DATA:  Seizure following dialysis. No history of seizures. EXAM: CT HEAD WITHOUT CONTRAST TECHNIQUE: Contiguous axial images were obtained from the base of the skull through the vertex without intravenous contrast. COMPARISON:  None. FINDINGS: Brain: Oval low density the mass with  peripheral rim of medium density in the left parietal region. This measures 2.1 cm in maximum diameter on image number 20 series 3. There is associated vasogenic edema in surrounding white matter. No significant mass effect is seen. Mildly enlarged ventricles and cortical sulci. Minimal patchy white matter low density in both cerebral hemispheres. No intracranial hemorrhage. Vascular: No hyperdense vessel or unexpected calcification. Skull: Normal. Negative for fracture or focal lesion. Sinuses/Orbits: Unremarkable. Other: None. IMPRESSION: 1. 2.1 cm left parietal mass with a peripheral rim of medium density and associated vasogenic edema. Differential considerations include primary brain malignancy, metastasis and abscess. Further evaluation with pre and postcontrast MRI of the brain is recommended. 2. No intracranial hemorrhage. 3. Mild diffuse cerebral and cerebellar atrophy. 4. Minimal chronic small vessel white matter ischemic changes in both cerebral hemispheres. Electronically Signed   By: Claudie Revering M.D.   On: 02/19/2019 14:07   Mr Angio Head Wo Contrast  Result Date: 02/20/2019 CLINICAL DATA:  Left parietal lobe hematoma. Intracranial hemorrhage. EXAM: MRA HEAD WITHOUT CONTRAST TECHNIQUE: Angiographic images of the Circle of Willis were obtained using MRA technique without intravenous contrast. COMPARISON:  None. FINDINGS: Moderate tortuosity is present in the cervical  right internal carotid artery. There is signal loss at the skull base of the vertebral arteries bilaterally. Mild narrowing is present at the cavernous internal carotid arteries without a significant stenosis relative to the more distal vessel. The ICA termini are normal. The A1 and M1 segments are normal. ACA and MCA branch vessels are normal. The right vertebral artery is dominant. There is signal loss in the distal left vertebral artery suggesting high-grade stenosis or occlusion. The basilar artery is normal. Both posterior cerebral  arteries originate from the basilar tip. PCA branch vessels are unremarkable. IMPRESSION: 1. Tortuous distal right internal carotid artery. This likely reflects sequela of chronic hypertension. 2. No focal vascular lesion to explain the patient's distal hemorrhage. 3. 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 Electronically Signed   By: San Morelle M.D.   On: 02/20/2019 20:21   Mr Brain Wo Contrast  Result Date: 02/20/2019 CLINICAL DATA:  Seizure following dialysis. EXAM: MRI HEAD WITHOUT CONTRAST TECHNIQUE: Multiplanar, multiecho pulse sequences of the brain and surrounding structures were obtained without intravenous contrast. COMPARISON:  Head CT from yesterday FINDINGS: Brain: The left low parietal mass is T1 hyperintense, ovoid, and subcortical - measuring up to 20 x 13 mm on sagittal T1 weighted imaging. There is mild adjacent edema. No suspected adjacent mass. No regional infarct is seen. There have been remote micro hemorrhages in the bilateral posterior circulation - both cerebral, thalamic, and infratentorial. Remote lacunar infarcts in the bilateral pons. Mild periventricular white matter disease, likely microvascular ischemia. 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 Normal venous flow voids including about the hematoma. Skull and upper cervical spine: No focal marrow lesion. Low marrow signal correlating with history of anemia. Sinuses/Orbits: Negative. IMPRESSION: The 20 x 13 mm left parietal mass is a subacute hematoma with mild vasogenic edema. There have been multiple remote hemorrhages and lacunar infarcts in the posterior circulation likely related to history of hypertension. No evidence of underlying mass, infarct, or venous occlusion - consider follow-up noncontrast brain MRI or head CT with contrast given history of end-stage renal disease. Electronically Signed   By: Monte Fantasia  M.D.   On: 02/20/2019 08:04   Vas US Carotid  Result Date: 02/21/2019 Carotid Arterial Duplex Study Indications:       CVA. Risk Factors:      Hypertension. Limitations        Today's exam was limited due to patient positioning and                    anatomy. Comparison Study:  no prior Performing Technologist: Abram Sander RVS  Examination Guidelines: A complete evaluation includes B-mode imaging, spectral Doppler, color Doppler, and power Doppler as needed of all accessible portions of each vessel. Bilateral testing is considered an integral part of a complete examination. Limited examinations for reoccurring indications may be performed as noted.  Right Carotid Findings: +----------+--------+--------+--------+------------------+--------+           PSV cm/sEDV cm/sStenosisPlaque DescriptionComments +----------+--------+--------+--------+------------------+--------+ CCA Prox  90      9               heterogenous               +----------+--------+--------+--------+------------------+--------+ CCA Distal74      12              heterogenous               +----------+--------+--------+--------+------------------+--------+  ICA Prox  61      11      1-39%   heterogenous      tortuous +----------+--------+--------+--------+------------------+--------+ ICA Distal54      14                                         +----------+--------+--------+--------+------------------+--------+ ECA       72                                        tortuous +----------+--------+--------+--------+------------------+--------+ +----------+--------+-------+--------+-------------------+           PSV cm/sEDV cmsDescribeArm Pressure (mmHG) +----------+--------+-------+--------+-------------------+ Subclavian105                                        +----------+--------+-------+--------+-------------------+ +---------+--------+--+--------+--+---------+ VertebralPSV cm/s63EDV  cm/s17Antegrade +---------+--------+--+--------+--+---------+  Left Carotid Findings: +----------+--------+--------+--------+------------------+--------+           PSV cm/sEDV cm/sStenosisPlaque DescriptionComments +----------+--------+--------+--------+------------------+--------+ CCA Prox  117     21              heterogenous               +----------+--------+--------+--------+------------------+--------+ CCA Distal86      21              heterogenous               +----------+--------+--------+--------+------------------+--------+ ICA Prox  89      34      1-39%   heterogenous      tortuous +----------+--------+--------+--------+------------------+--------+ ICA Distal57      22                                         +----------+--------+--------+--------+------------------+--------+ ECA       73      9                                 tortuous +----------+--------+--------+--------+------------------+--------+ +----------+--------+--------+------------+-------------------+           PSV cm/sEDV cm/sDescribe    Arm Pressure (mmHG) +----------+--------+--------+------------+-------------------+ Subclavian                Not assessed                    +----------+--------+--------+------------+-------------------+ +---------+--------+--+--------+-+---------+ VertebralPSV cm/s45EDV cm/s9Antegrade +---------+--------+--+--------+-+---------+  Summary: Right Carotid: Velocities in the right ICA are consistent with a 1-39% stenosis. Left Carotid: Velocities in the left ICA are consistent with a 1-39% stenosis. Vertebrals: Bilateral vertebral arteries demonstrate antegrade flow. *See table(s) above for measurements and observations.     Preliminary     PHYSICAL EXAM  Temp:  [98 F (36.7 C)-98.8 F (37.1 C)] 98.5 F (36.9 C) (09/07 1618) Pulse Rate:  [73-83] 73 (09/07 1618) Resp:  [16-41] 18 (09/07 1618) BP: (144-178)/(95-110) 155/105 (09/07  1618) SpO2:  [96 %-100 %] 98 % (09/07 1618) Weight:  [52.5 kg] 52.5 kg (09/07 0347)  General - Well nourished, well developed, in no apparent distress.  Ophthalmologic - fundi not visualized due to noncooperation.  Cardiovascular - Regular rate  and rhythm.  Mental Status -  Level of arousal and orientation to time, place, and person were intact. Language including expression, naming, repetition, comprehension was assessed and found intact. Fund of Knowledge was assessed and was intact.  Cranial Nerves II - XII - II - Visual field intact OU. III, IV, VI - Extraocular movements intact. V - Facial sensation intact bilaterally. VII - Facial movement intact bilaterally. VIII - Hearing & vestibular intact bilaterally. X - Palate elevates symmetrically. XI - Chin turning & shoulder shrug intact bilaterally. XII - Tongue protrusion intact.  Motor Strength - The patient's strength was normal in all extremities and pronator drift was absent.  Bulk was normal and fasciculations were absent.  Left upper arm AVF.  Motor Tone - Muscle tone was assessed at the neck and appendages and was normal.  Reflexes - The patient's reflexes were symmetrical in all extremities and she had no pathological reflexes.  Sensory - Light touch, temperature/pinprick were assessed and were symmetrical.    Coordination - The patient had normal movements in the hands and feet with no ataxia or dysmetria.  Tremor was absent.  Gait and Station - deferred.   ASSESSMENT/PLAN [name] is a 43 y.o. female with history of ESRD s/p HD who had a 1 min episode of whole body shaking and frothing at the mouth once home. She has no hx seizure. She was brought to the ED.  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 No hemorrhage.      MRI  20x13 mm L parietal mass w/ subacute hemorrhage and mild vasogenic edema.   MRA  Tortuous R ICA. No vascular lesion. L VA stenosis/occlusion   Carotid Doppler  B ICA 1-39% stenosis, VAs antegrade   2D Echo 10/2018 EF >65%. RA & LA dilated.  LDL 138  HgbA1c 4.3  UDS positive for THC  SCDs for VTE prophylaxis  No antithrombotic prior to admission, now on No antithrombotic given hemorrhage. Follow up at [facility] to consider antiplatelet at that time  Therapy recommendations:  [facility]  Disposition:  pending   Repeat MRI with and without contrast in 1 month to look for underlying source of hemorrhage at outpt follow up  Hypertension  BP 140-170s  On amlodipine, coreg, and spironolactone . SBP goal < 160 given age of hemorrhage . Long-term BP goal normotensive . BP monitoring at home - pt agrees  Hyperlipidemia  Home meds:  No statin  LDL 138   Now on lipitor 20  Continue statin at discharge    Tobacco abuse  Current smoker  Smoking cessation counseling provided  Pt is willing to quit  Other Stroke Risk Factors  ETOH use, advised to drink no more than 1 drink(s) a day  Substance abuse,  UDS:  THC POSITIVE. Patient advised to stop using due topossible  stroke risk.  Other Active Problems  ESRD on HD  Hospital day # 2  Neurology will sign off. Please call with questions. Pt will follow up with stroke clinic NP at [facility] in about 4 weeks.  Thanks for the consult.  Rosalin Hawking, MD PhD Stroke Neurology 02/21/2019 4:27 PM    To contact Stroke Continuity provider, please refer to [email or web address] After hours, contact General Neurology

## 2019-02-21 NOTE — Consult Note (Signed)
Reason for Consult: Continuity of ESRD care Referring Physician: Lalla Brothers, MD ([facility]TS)  HPI: 43 year old African-American woman with past medical history significant for hypertension and consequent end-stage renal disease on hemodialysis on a Tuesday/Thursday/Saturday schedule who was brought to the emergency room 2 days ago after dialysis earlier that day when she was noted to have a seizure-like episode and thereafter found with altered LOC/postictal.  When evaluated by EMS, she again had another seizure.  She did not have any bladder or bowel incontinence.  MRI of the brain showed left parietal intracerebral hemorrhage for which she has been seen earlier by neurology and neurosurgery and felt not to have any surgical indications.  She denies any chest pain or shortness of breath and at this time is lucid and without any complaints including headache.  She denies any nausea, vomiting or diarrhea.  Hemodialysis prescription: TTS, [facility], left upper arm arteriovenous fistula.  180 dialyzer, 4 hours, BFR 350/DFR 600, EDW 54 kg, 2K/2.0 calcium.  Mircera 75 mcg every 2 weeks, calcitriol 2 mcg p.o. 3 times weekly, Sensipar 30 mg 3 times weekly Benadryl 50 mg as needed.  Renagel 1600 mg 3 times daily.  Past Medical History:  Diagnosis Date  . Anemia   . Chronic kidney disease   . Hypertension     Past Surgical History:  Procedure Laterality Date  . AV FISTULA PLACEMENT Left 06/24/2017   Procedure: CREATION OF LEFT ARM BRACHIALCEPHALIC  ARTERIOVENOUS (AV) FISTULA;  Surgeon: Rosetta Posner, MD;  Location: [place];  Service: Vascular;  Laterality: Left;  . CESAREAN SECTION     x2  . FISTULA SUPERFICIALIZATION Left 10/28/2017   Procedure: SUPERFICIALIZATION LEFT BRACHIOCEPHALIC ARTERIOVENOUS FISTULA;  Surgeon: Rosetta Posner, MD;  Location: [place];  Service: Vascular;  Laterality: Left;  . INSERTION OF DIALYSIS CATHETER Right 06/24/2017   Procedure: INSERTION OF TUNNELED DIALYSIS  CATHETER;  Surgeon: Rosetta Posner, MD;  Location: [place];  Service: Vascular;  Laterality: Right;  . LIGATION OF COMPETING BRANCHES OF ARTERIOVENOUS FISTULA  10/28/2017   Procedure: LIGATION OF COMPETING BRANCHES OF ARTERIOVENOUS FISTULA x3;  Surgeon: Rosetta Posner, MD;  Location: [facility] OR;  Service: Vascular;;    Family History  Problem Relation Age of Onset  . Diabetes Father   . Hypertension Father   . Cancer Father   . Heart disease Father     Social History:  reports that she has been smoking cigarettes. She has a 3.75 pack-year smoking history. She has never used smokeless tobacco. She reports current alcohol use. She reports current drug use. Drug: Marijuana.  Allergies:  Allergies  Allergen Reactions  . Visine [Tetrahydrozoline Hcl] Swelling    Eyes Swelling.    Medications:  Scheduled: . amLODipine  10 mg Oral QHS  . carvedilol  25 mg Oral BID WC  . hydrALAZINE  100 mg Oral BID  . levETIRAcetam  1,000 mg Oral Daily    BMP Latest Ref Rng & Units 02/21/2019 02/19/2019 07/13/2018  Glucose 70 - 99 mg/dL 106(H) 85 94  BUN 6 - 20 mg/dL 44(H) 16 44(H)  Creatinine 0.44 - 1.00 mg/dL 7.11(H) 3.77(H) 5.73(H)  BUN/Creat Ratio 9 - 23 - - -  Sodium 135 - 145 mmol/L 138 136 137  Potassium 3.5 - 5.1 mmol/L 4.2 3.9 3.8  Chloride 98 - 111 mmol/L 100 92(L) 99  CO2 22 - 32 mmol/L 24 30 23   Calcium 8.9 - 10.3 mg/dL 9.1 8.7(L) 9.1   CBC Latest Ref  Rng & Units 02/21/2019 02/20/2019 02/19/2019  WBC 4.0 - 10.5 K/uL 3.2(L) 4.8 5.1  Hemoglobin 12.0 - 15.0 g/dL 11.4(L) 11.1(L) 11.9(L)  Hematocrit 36.0 - 46.0 % 33.9(L) 33.4(L) 35.6(L)  Platelets 150 - 400 K/uL 132(L) 120(L) 126(L)     Ct Head Wo Contrast  Result Date: 02/19/2019 CLINICAL DATA:  Seizure following dialysis. No history of seizures. EXAM: CT HEAD WITHOUT CONTRAST TECHNIQUE: Contiguous axial images were obtained from the base of the skull through the vertex without intravenous contrast. COMPARISON:  None. FINDINGS: Brain: Oval low density  the mass with peripheral rim of medium density in the left parietal region. This measures 2.1 cm in maximum diameter on image number 20 series 3. There is associated vasogenic edema in surrounding white matter. No significant mass effect is seen. Mildly enlarged ventricles and cortical sulci. Minimal patchy white matter low density in both cerebral hemispheres. No intracranial hemorrhage. Vascular: No hyperdense vessel or unexpected calcification. Skull: Normal. Negative for fracture or focal lesion. Sinuses/Orbits: Unremarkable. Other: None. IMPRESSION: 1. 2.1 cm left parietal mass with a peripheral rim of medium density and associated vasogenic edema. Differential considerations include primary brain malignancy, metastasis and abscess. Further evaluation with pre and postcontrast MRI of the brain is recommended. 2. No intracranial hemorrhage. 3. Mild diffuse cerebral and cerebellar atrophy. 4. Minimal chronic small vessel white matter ischemic changes in both cerebral hemispheres. Electronically Signed   By: Claudie Revering M.D.   On: 02/19/2019 14:07   Mr Angio Head Wo Contrast  Result Date: 02/20/2019 CLINICAL DATA:  Left parietal lobe hematoma. Intracranial hemorrhage. EXAM: MRA HEAD WITHOUT CONTRAST TECHNIQUE: Angiographic images of the Circle of Willis were obtained using MRA technique without intravenous contrast. COMPARISON:  None. FINDINGS: Moderate tortuosity is present in the cervical right internal carotid artery. There is signal loss at the skull base of the vertebral arteries bilaterally. Mild narrowing is present at the cavernous internal carotid arteries without a significant stenosis relative to the more distal vessel. The ICA termini are normal. The A1 and M1 segments are normal. ACA and MCA branch vessels are normal. The right vertebral artery is dominant. There is signal loss in the distal left vertebral artery suggesting high-grade stenosis or occlusion. The basilar artery is normal. Both  posterior cerebral arteries originate from the basilar tip. PCA branch vessels are unremarkable. IMPRESSION: 1. Tortuous distal right internal carotid artery. This likely reflects sequela of chronic hypertension. 2. No focal vascular lesion to explain the patient's distal hemorrhage. 3. 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 Electronically Signed   By: San Morelle M.D.   On: 02/20/2019 20:21   Mr Brain Wo Contrast  Result Date: 02/20/2019 CLINICAL DATA:  Seizure following dialysis. EXAM: MRI HEAD WITHOUT CONTRAST TECHNIQUE: Multiplanar, multiecho pulse sequences of the brain and surrounding structures were obtained without intravenous contrast. COMPARISON:  Head CT from yesterday FINDINGS: Brain: The left low parietal mass is T1 hyperintense, ovoid, and subcortical - measuring up to 20 x 13 mm on sagittal T1 weighted imaging. There is mild adjacent edema. No suspected adjacent mass. No regional infarct is seen. There have been remote micro hemorrhages in the bilateral posterior circulation - both cerebral, thalamic, and infratentorial. Remote lacunar infarcts in the bilateral pons. Mild periventricular white matter disease, likely microvascular ischemia. 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 Normal venous flow voids including about the hematoma. Skull and upper cervical spine: No focal marrow lesion. Low marrow  signal correlating with history of anemia. Sinuses/Orbits: Negative. IMPRESSION: The 20 x 13 mm left parietal mass is a subacute hematoma with mild vasogenic edema. There have been multiple remote hemorrhages and lacunar infarcts in the posterior circulation likely related to history of hypertension. No evidence of underlying mass, infarct, or venous occlusion - consider follow-up noncontrast brain MRI or head CT with contrast given history of end-stage renal disease. Electronically Signed    By: Monte Fantasia M.D.   On: 02/20/2019 08:04    Review of Systems  Constitutional: Negative.   HENT: Negative.   Eyes: Negative.   Respiratory: Negative.   Cardiovascular: Negative.   Gastrointestinal: Negative.   Genitourinary: Negative.   Musculoskeletal: Negative.   Skin: Negative.   Neurological: Negative for dizziness, sensory change, focal weakness and headaches.   Blood pressure (!) 144/95, pulse 81, temperature 98 F (36.7 C), resp. rate 18, height 5' (1.524 m), weight 52.5 kg, SpO2 97 %, unknown if currently breastfeeding. Physical Exam  Nursing note and vitals reviewed. Constitutional: She is oriented to person, place, and time. She appears well-developed and well-nourished. No distress.  HENT:  Head: Normocephalic and atraumatic.  Mouth/Throat: Oropharynx is clear and moist. No oropharyngeal exudate.  Eyes: Pupils are equal, round, and reactive to light. Conjunctivae are normal. No scleral icterus.  Neck: Normal range of motion. Neck supple. No JVD present.  Cardiovascular: Normal rate, regular rhythm and normal heart sounds. Exam reveals no friction rub.  No murmur heard. Respiratory: Effort normal and breath sounds normal. She has no wheezes. She has no rales.  GI: Soft. Bowel sounds are normal. There is no abdominal tenderness. There is no rebound.  Musculoskeletal: Normal range of motion.        General: No edema.     Comments: Left upper arm brachiocephalic fistula  Neurological: She is alert and oriented to person, place, and time.  Skin: Skin is warm and dry.  Psychiatric: She has a normal mood and affect.    Assessment/Plan: 1. Left parietal hemorrhage with seizure: Not a surgical candidate and plans noted for ongoing outpatient follow-up with neurosurgery with ongoing antiseizure management by neurology. 2.  End-stage renal disease: Continue hemodialysis on a TTS schedule, next hemodialysis tomorrow.  She does not have any acute electrolyte abnormality,  significant uremic signs or symptoms or volume overload to prompt dialysis today. 3.  Hypertension: Pertinent for ongoing risk factor management for intracranial hemorrhage, on amlodipine, carvedilol and hydralazine and will monitor with UF at HD.  Of note, she is also on spironolactone 50 mg daily as an outpatient. 4.  Secondary hyperparathyroidism: Elevated phosphorus level noted-in part from recent seizure, continue 5.  Anemia of chronic kidney disease: Hemoglobin and hematocrit currently at goal, hold ESA.   [place] Knaus K. 02/21/2019, 10:19 AM

## 2019-02-24 NOTE — Discharge Summary (Signed)
Name: Madeline Brown MRN: [id number] DOB: [birth year] 43 y.o. PCP: Patient, No Pcp Per  Date of Admission: 02/19/2019  1:05 PM Date of Discharge: 02/21/2019 Attending Physician: Dr.Duncan Evette Doffing Discharge Diagnosis: 1. Seizure 2/2 Left Parietal Hematoma   Discharge Medications: Allergies as of 02/21/2019      Reactions   Visine [tetrahydrozoline Hcl] Swelling   Eyes Swelling      Medication List    TAKE these medications   amLODipine 10 MG tablet Commonly known as: NORVASC Take 10 mg by mouth at bedtime.   atorvastatin 20 MG tablet Commonly known as: LIPITOR Take 1 tablet (20 mg total) by mouth daily at 6 PM. Notes to patient: 02/22/2019   calcitRIOL 0.5 MCG capsule Commonly known as: ROCALTROL Take 4 capsules (2 mcg total) by mouth Every Tuesday,Thursday,and Saturday with dialysis. What changed:   medication strength  how much to take   carvedilol 25 MG tablet Commonly known as: COREG Take 1 tablet (25 mg total) by mouth 2 (two) times daily with a meal.   hydrALAZINE 100 MG tablet Commonly known as: APRESOLINE Take 100 mg by mouth 2 (two) times daily.   levETIRAcetam 1000 MG tablet Commonly known as: KEPPRA Take 1 tablet (1,000 mg total) by mouth daily.   lidocaine-prilocaine cream Commonly known as: EMLA Apply 1 application topically See admin instructions. Apply small amount to access site one hour before dialysis (Tuesday, Thursday, Saturday). Cover with occlusive dressing (Saran wrap)   multivitamin Tabs tablet Take 1 tablet by mouth at bedtime.   sevelamer carbonate 800 MG tablet Commonly known as: RENVELA Take 2 tablets (1,600 mg total) by mouth 3 (three) times daily with meals. Notes to patient: 02/22/2019   spironolactone 50 MG tablet Commonly known as: ALDACTONE Take 50 mg by mouth daily.       Disposition and follow-up:   [name] was discharged from [facility] in Stable condition.  At the hospital follow up visit  please address:  1.    Left parietal hematoma  Seizure  - Patient to follow up in one month with neurology to manage Keppra, repeat MRI w and w/o contrast to look for underlying source of hemorrhage. Leading thought is mass seen in left parietal is a subacute hematoma from uncontrolled hypertension.  HTN - Coreg Hydralazine Spirolactone and Amlodipine.  - Check BP , patient reports hard to control BP.    HLD - Continue Lipitor  2.  Labs / imaging needed at time of follow-up: MRI in 4 weeks with neurology  3.  Pending labs/ test needing follow-up:  Follow-up Appointments: Follow-up [place] Follow up in 2 week(s).   Why: You mentioned you have appointment already schedule. Follow up in 1-2 weeks with your doctor.        [facility]. Schedule an appointment as soon as possible for a visit in 4 week(s).   Specialty: Neurology Contact information: [address] [place] [phone]       Home, [facility] Follow up.   Specialty: Home Health Services Why: The home health agency will call you for the first home visit. Contact information: [address] [place] [address] [phone]           Hospital Course by problem list: 1.   #Left parietal Hematoma Patient with no history of seizures.  GCS 10 [E(3) V(1) M(6)] on exam. Seizure witness by daughter and husband.  MRI showed 2.1 cm left parietal mass with vasogenic edema. EEG showed potential seizure focus in the left parieto-occipital region.  Also evidence of focal cerebral dysfunction in the left posterior quadrant consistent with the patient's  mass lesion there or possible postictal state. No active seizure. Started on Keppra. Patient with no more seizures and only deficit on day 2 was problem with word finding.  Neurology consult and neurosurgery both agree to monitor patient and no need for surgery at this  time.Patient to be followed in neurology clinic in 4 weeks.   #ESRD Patient on dialysis Tuesdays Thursdays Saturdays.  Creatinine 3.77 no signs for emergent hemodialysis, will consult nephrology tomorrow  #HLD LDL elevated -Lipitor 20mg   #HTN - Coreg Hydralazine Spirolactone and Amlodipine.  - SBP goal < 160  Discharge Vitals:   BP (!) 161/99 (BP Location: Right Arm) Comment: carvidlol will be given before d/c  Pulse 76   Temp 98.5 F (36.9 C) (Oral)   Resp 18   Ht 5' (1.524 m)   Wt 52.5 kg   SpO2 100%   BMI 22.60 kg/m   Pertinent Labs, Studies, and Procedures:  CBC Latest Ref Rng & Units 02/21/2019 02/20/2019 02/19/2019  WBC 4.0 - 10.5 K/uL 3.2(L) 4.8 5.1  Hemoglobin 12.0 - 15.0 g/dL 11.4(L) 11.1(L) 11.9(L)  Hematocrit 36.0 - 46.0 % 33.9(L) 33.4(L) 35.6(L)  Platelets 150 - 400 K/uL 132(L) 120(L) 126(L)   BMP Latest Ref Rng & Units 02/21/2019 02/19/2019 07/13/2018  Glucose 70 - 99 mg/dL 106(H) 85 94  BUN 6 - 20 mg/dL 44(H) 16 44(H)  Creatinine 0.44 - 1.00 mg/dL 7.11(H) 3.77(H) 5.73(H)  BUN/Creat Ratio 9 - 23 - - -  Sodium 135 - 145 mmol/L 138 136 137  Potassium 3.5 - 5.1 mmol/L 4.2 3.9 3.8  Chloride 98 - 111 mmol/L 100 92(L) 99  CO2 22 - 32 mmol/L 24 30 23   Calcium 8.9 - 10.3 mg/dL 9.1 8.7(L) 9.1   CT Head wo Contrast  FINDINGS: Brain: Oval low density the mass with peripheral rim of medium density in the left parietal region. This measures 2.1 cm in maximum diameter on image number 20 series 3. There is associated vasogenic edema in surrounding white matter. No significant mass effect is seen. Mildly enlarged ventricles and cortical sulci. Minimal patchy white matter low density in both cerebral hemispheres. No intracranial hemorrhage.  Vascular: No hyperdense vessel or unexpected calcification.  Skull: Normal. Negative for fracture or focal lesion.  Sinuses/Orbits: Unremarkable.  Other: None.  IMPRESSION: 1. 2.1 cm left parietal mass with a peripheral rim  of medium density and associated vasogenic edema. Differential considerations include primary brain malignancy, metastasis and abscess. Further evaluation with pre and postcontrast MRI of the brain is recommended. 2. No intracranial hemorrhage. 3. Mild diffuse cerebral and cerebellar atrophy. 4. Minimal chronic small vessel white matter ischemic changes in both cerebral hemispheres  MR Brain WO Contrast  FINDINGS: Brain: The left low parietal mass is T1 hyperintense, ovoid, and subcortical - measuring up to 20 x 13 mm on sagittal T1 weighted imaging. There is mild adjacent edema. No suspected adjacent mass. No regional infarct is seen. There have been remote micro hemorrhages in the bilateral posterior circulation - both cerebral, thalamic, and infratentorial.  Remote lacunar infarcts in the bilateral pons. Mild periventricular white matter disease, likely microvascular ischemia.  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 Normal venous flow voids including about the hematoma.  Skull and upper  cervical spine: No focal marrow lesion. Low marrow signal correlating with history of anemia.  Sinuses/Orbits: Negative.  IMPRESSION: The 20 x 13 mm left parietal mass is a subacute hematoma with mild vasogenic edema. There have been multiple remote hemorrhages and lacunar infarcts in the posterior circulation likely related to history of hypertension. No evidence of underlying mass, infarct, or venous occlusion - consider follow-up noncontrast brain MRI or head CT with contrast given history of end-stage renal disease. Brain wo Contrast  MR Angio Head WO Contrast  FINDINGS: Moderate tortuosity is present in the cervical right internal carotid artery. There is signal loss at the skull base of the vertebral arteries bilaterally. Mild narrowing is present at the cavernous internal carotid arteries without a  significant stenosis relative to the more distal vessel. The ICA termini are normal. The A1 and M1 segments are normal. ACA and MCA branch vessels are normal.  The right vertebral artery is dominant. There is signal loss in the distal left vertebral artery suggesting high-grade stenosis or occlusion. The basilar artery is normal. Both posterior cerebral arteries originate from the basilar tip. PCA branch vessels are unremarkable.  IMPRESSION: 1. Tortuous distal right internal carotid artery. This likely reflects sequela of chronic hypertension. 2. No focal vascular lesion to explain the patient's distal hemorrhage. 3. 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   Discharge Instructions: Discharge Instructions    Ambulatory referral to Neurology   Complete by: As directed    Follow up with stroke clinic NP (Jessica Vanschaick or Cecille Rubin, if both not available, consider Zachery Dauer, or A[place]) at [facility] in about 4 weeks. Thanks.      Signed:  Tamsen Snider, MD PGY1  [phone]

## 2019-03-02 ENCOUNTER — Other Ambulatory Visit: Payer: Self-pay

## 2019-03-02 NOTE — Patient Outreach (Signed)
[facility]) Care Management  03/02/2019  Madeline Brown 1975-11-27 [id number]    EMMI-Stroke-Not on APL RED ON EMMI ALERT Day # 6 Date: 03/01/2019 Red Alert Reason: " Smoked or been around smoke? Yes"   Outreach attempt # 1 to patient. Spoke with patient who voices things are going well. She denies any acute issues or concerns at this time. Reviewed and addressed red alert with patient. She reports that she is a smoker. However,. She has made the decision to quit. She voices that today is her first day of "starting to quit." She has not had a cigarette all today. RN CM praised patient and encouraged her to continue with efforts. She voices that she wants to try to do it cold [place] first. RN CM discussed with patient smoking cessation options and encouraged her to discuss this with PCP. She voiced understanding. She states she forgot about PCP appt earlier this week but has rescheduled it. She has neuro appt on Oct. 9th and cardiology appt on Oct. 2nd. She uses Medicaid transportation to get to appts. She confirms that she has all her meds in the home except she needs to pick up refill on one med later today. Patient voices that [facility] services in place. HHPT was out on Monday to see her and ST was in the home earlier this morning. She denies any further RN CM needs or concerns at this time. Advised patient that they would continue to get automated EMMI-Stroke post discharge calls to assess how they are doing following recent hospitalization and will receive a call from a nurse if any of their responses were abnormal. Patient voiced understanding and was appreciative of f/u call.      Plan: RN CM will close case as no further interventions needed at this time.  Enzo [place], RN,BSN,CCM [place] Management Telephonic Care Management Coordinator Direct Phone: [phone] Toll Free: [phone] Fax: [phone]

## 2019-03-09 ENCOUNTER — Other Ambulatory Visit: Payer: Self-pay

## 2019-03-09 NOTE — Patient Outreach (Signed)
[facility]) Care Management  03/09/2019  Madeline Brown [birth year] [id number]    EMMI-STROKE RED ON EMMI ALERT Day # 13 Date: 03/08/2019 Red Alert Reason: "Went to follow-up appt?  Scheduled a follow up appt? No"     Outreach attempt #1 to patient. No answer at present. RN CM unable to leave message as voicemail box full.       Plan: RN CM will make outreach attempt to patient within 3-4 business days.   Enzo [place], RN,BSN,CCM [place] Management Telephonic Care Management Coordinator Direct Phone: [phone] Toll Free: [phone] Fax: [phone]

## 2019-03-10 ENCOUNTER — Other Ambulatory Visit: Payer: Self-pay

## 2019-03-10 NOTE — Patient Outreach (Signed)
[facility]) Care Management  03/10/2019  Madeline Brown March 31, 1976 [id number]   EMMI-STROKE RED ON EMMI ALERT Day # 13 Date: 03/08/2019 Red Alert Reason: "Went to follow-up appt?  Scheduled a follow up appt? No"    Outreach attempt #2 to patient. No answer at present and voicemail full.     Plan: RN CM will make outreach attempt to patient within 3-4 business days.     Enzo [place], RN,BSN,CCM [place] Management Telephonic Care Management Coordinator Direct Phone: [phone] Toll Free: [phone] Fax: [phone]

## 2019-03-11 ENCOUNTER — Other Ambulatory Visit: Payer: Self-pay

## 2019-03-11 NOTE — Patient Outreach (Signed)
[facility]) Care Management  03/11/2019  Madeline Brown 1975/09/06 [id number]    EMMI-STROKE RED ON EMMI ALERT Day #13 Date:03/08/2019 Red Alert Reason:"Went to follow-up appt? Scheduled a follow up appt? No"    Outreach attempt #3 to patient. No answer at present.    Plan: RN CM will close case due to unable to reach patient.   Enzo [place], RN,BSN,CCM [place] Management Telephonic Care Management Coordinator Direct Phone: [phone] Toll Free: [phone] Fax: [phone]

## 2019-03-17 NOTE — Progress Notes (Deleted)
Cardiology Office Note   Date:  03/17/2019   ID:  [name], DOB [birth year], MRN [id number]  PCP:  Patient, No Pcp Per  Cardiologist:   Jenkins Rouge, MD   No chief complaint on file.     History of Present Illness: Madeline Brown is a 43 y.o. female who presents for post hospital f/u pericardial effusion. Seen by me in August 2018 for elevated troponin in setting of CRF and anemia. Seen 07/12/18 by Dr Debara Pickett. Patient ESRD now on dialysis , Smoker , Low Platelets Has been on minoxidil for HTN and TTE with small to moderate pericardial effusion.   TTE 07/12/18 EF 60-65% severe LVH Estimated PA pressure 54 mmHg  Small to moderate circumferential effusion no tamponade  She had high oxygen requirements CTA negative for PE  To f/u Dr Nelda Marseille auto immune studies negative Needs sleep study  She continues to have issues with arranging transportation to dialysis  She sees [facility] for her access which seems very large in LUE  F/U echo 11/11/18 EF >65% mild MR estimated PA pressure 40 mmHg effusion resolved   ***   Past Medical History:  Diagnosis Date  . Anemia   . Chronic kidney disease   . Hypertension     Past Surgical History:  Procedure Laterality Date  . AV FISTULA PLACEMENT Left 06/24/2017   Procedure: CREATION OF LEFT ARM BRACHIALCEPHALIC  ARTERIOVENOUS (AV) FISTULA;  Surgeon: Rosetta Posner, MD;  Location: [place];  Service: Vascular;  Laterality: Left;  . CESAREAN SECTION     x2  . FISTULA SUPERFICIALIZATION Left 10/28/2017   Procedure: SUPERFICIALIZATION LEFT BRACHIOCEPHALIC ARTERIOVENOUS FISTULA;  Surgeon: Rosetta Posner, MD;  Location: [place];  Service: Vascular;  Laterality: Left;  . INSERTION OF DIALYSIS CATHETER Right 06/24/2017   Procedure: INSERTION OF TUNNELED DIALYSIS CATHETER;  Surgeon: Rosetta Posner, MD;  Location: [place];  Service: Vascular;  Laterality: Right;  . LIGATION OF COMPETING BRANCHES OF ARTERIOVENOUS FISTULA  10/28/2017   Procedure: LIGATION OF  COMPETING BRANCHES OF ARTERIOVENOUS FISTULA x3;  Surgeon: Rosetta Posner, MD;  Location: [facility];  Service: Vascular;;     Current Outpatient Medications  Medication Sig Dispense Refill  . amLODipine (NORVASC) 10 MG tablet Take 10 mg by mouth at bedtime.    Marland Kitchen atorvastatin (LIPITOR) 20 MG tablet Take 1 tablet (20 mg total) by mouth daily at 6 PM. 30 tablet 0  . calcitRIOL (ROCALTROL) 0.5 MCG capsule Take 4 capsules (2 mcg total) by mouth Every Tuesday,Thursday,and Saturday with dialysis. 48 capsule 0  . carvedilol (COREG) 25 MG tablet Take 1 tablet (25 mg total) by mouth 2 (two) times daily with a meal. 60 tablet 0  . hydrALAZINE (APRESOLINE) 100 MG tablet Take 100 mg by mouth 2 (two) times daily.    Marland Kitchen levETIRAcetam (KEPPRA) 1000 MG tablet Take 1 tablet (1,000 mg total) by mouth daily. 45 tablet 0  . lidocaine-prilocaine (EMLA) cream Apply 1 application topically See admin instructions. Apply small amount to access site one hour before dialysis (Tuesday, Thursday, Saturday). Cover with occlusive dressing (Saran wrap)    . multivitamin (RENA-VIT) TABS tablet Take 1 tablet by mouth at bedtime. 30 tablet 0  . sevelamer carbonate (RENVELA) 800 MG tablet Take 2 tablets (1,600 mg total) by mouth 3 (three) times daily with meals. 30 tablet 0  . spironolactone (ALDACTONE) 50 MG tablet Take 50 mg by mouth daily.     No current facility-administered medications for this  visit.     Allergies:   Visine [tetrahydrozoline hcl]    Social History:  The patient  reports that she has been smoking cigarettes. She has a 3.75 pack-year smoking history. She has never used smokeless tobacco. She reports current alcohol use. She reports current drug use. Drug: Marijuana.   Family History:  The patient's family history includes Cancer in her father; Diabetes in her father; Heart disease in her father; Hypertension in her father.    ROS:  Please see the history of present illness.   Otherwise, review of systems are  positive for none.   All other systems are reviewed and negative.    PHYSICAL EXAM: VS:  There were no vitals taken for this visit. , BMI There is no height or weight on file to calculate BMI. Affect appropriate Chronically ill black female  HEENT: normal Neck supple with no adenopathy JVP normal no bruits no thyromegaly Lungs clear with no wheezing and good diaphragmatic motion Heart:  S1/S2 continuous shunt murmur, no rub, gallop or click PMI normal Abdomen: benighn, BS positve, no tenderness, no AAA no bruit.  No HSM or HJR Distal pulses intact with no bruits No edema Neuro non-focal Skin warm and dry No muscular weakness LUE fistual with thrill     EKG:  07/12/18 SR LAE LVH low voltage    Recent Labs: 07/11/2018: ALT 16 02/19/2019: Magnesium 2.3 02/21/2019: BUN 44; Creatinine, Ser 7.11; Hemoglobin 11.4; Platelets 132; Potassium 4.2; Sodium 138    Lipid Panel    Component Value Date/Time   CHOL 233 (H) 02/21/2019 0425   TRIG 50 02/21/2019 0425   HDL 85 02/21/2019 0425   CHOLHDL 2.7 02/21/2019 0425   VLDL 10 02/21/2019 0425   LDLCALC 138 (H) 02/21/2019 0425      Wt Readings from Last 3 Encounters:  02/21/19 115 lb 11.9 oz (52.5 kg)  08/18/18 123 lb 12.8 oz (56.2 kg)  07/13/18 112 lb 14 oz (51.2 kg)      Other studies Reviewed: Additional studies/ records that were reviewed today include: Notes from hospitalization January 2020 Dr [facility] consult Note labs CTA, CXR and TTE .    ASSESSMENT AND PLAN:  1.  Pericardial effusion: likely related to uremia.  Resolved on most recent echo 11/11/18 2.  HTN:  Well controlled.  Continue current medications and  low sodium Dash type diet.   3  CRF:  Continue dialysis has had issues with fistula Seems too large to me with large shunt Causing continuous murmur in heart f/ [facility]  4. Pulmonary HTN:  F/u pulmonary needs sleep study should be improved with lower volume    Current medicines are reviewed at length with the  patient today.  The patient does not have concerns regarding medicines.  The following changes have been made:  no change  Labs/ tests ordered today include: None   No orders of the defined types were placed in this encounter.    Disposition:   FU with cardiology  PRN     Signed, Jenkins Rouge, MD  03/17/2019 5:57 PM    [address], [place], [place]  [address] Phone: [phone]; Fax: [phone]

## 2019-03-18 ENCOUNTER — Ambulatory Visit: Payer: Medicare Other | Admitting: Cardiovascular Disease

## 2019-03-23 ENCOUNTER — Telehealth: Payer: Self-pay | Admitting: Adult Health

## 2019-03-23 ENCOUNTER — Other Ambulatory Visit: Payer: Self-pay

## 2019-03-23 ENCOUNTER — Ambulatory Visit (INDEPENDENT_AMBULATORY_CARE_PROVIDER_SITE_OTHER): Payer: Medicare Other | Admitting: Adult Health

## 2019-03-23 ENCOUNTER — Encounter: Payer: Self-pay | Admitting: Adult Health

## 2019-03-23 VITALS — BP 162/98 | HR 74 | Temp 97.1°F | Ht 60.0 in | Wt 128.4 lb

## 2019-03-23 DIAGNOSIS — I611 Nontraumatic intracerebral hemorrhage in hemisphere, cortical: Secondary | ICD-10-CM

## 2019-03-23 DIAGNOSIS — I10 Essential (primary) hypertension: Secondary | ICD-10-CM

## 2019-03-23 DIAGNOSIS — E785 Hyperlipidemia, unspecified: Secondary | ICD-10-CM | POA: Diagnosis not present

## 2019-03-23 DIAGNOSIS — R569 Unspecified convulsions: Secondary | ICD-10-CM

## 2019-03-23 MED ORDER — LEVETIRACETAM 1000 MG PO TABS: 1000.0000 mg | ORAL_TABLET | Freq: Every day | ORAL | 3 refills | Status: DC

## 2019-03-23 MED ORDER — ATORVASTATIN CALCIUM 20 MG PO TABS: 20.0000 mg | ORAL_TABLET | Freq: Every day | ORAL | 3 refills | Status: DC

## 2019-03-23 NOTE — Telephone Encounter (Signed)
Medicare/medicaid order sent to GI. No auth they will reach out to the patient to schedule.  °

## 2019-03-23 NOTE — Progress Notes (Signed)
I agree with the above plan 

## 2019-03-23 NOTE — Patient Instructions (Signed)
Repeat EEG but continue Keppra 1000 mg daily at this time  Repeat MRI to assess for potential underlying causes of your recent bleed  Continue atorvastatin for secondary stroke prevention   We will repeat cholesterol levels to assess for improvement of your LDL or bad cholesterol  Continue to follow up with PCP regarding cholesterol and blood pressure management   Continue to monitor blood pressure at home  Maintain strict control of hypertension with blood pressure goal below 130/90, diabetes with hemoglobin A1c goal below 6.5% and cholesterol with LDL cholesterol (bad cholesterol) goal below 70 mg/dL. I also advised the patient to eat a healthy diet with plenty of whole grains, cereals, fruits and vegetables, exercise regularly and maintain ideal body weight.  Followup in the future with me in 3 months or call earlier if needed       Thank you for coming to see [place] at [facility]. I hope we have been able to provide you high quality care today.  You may receive a patient satisfaction survey over the next few weeks. We would appreciate your feedback and comments so that we may continue to improve ourselves and the health of our patients.

## 2019-03-23 NOTE — Progress Notes (Signed)
[facility] [address] [place]. [place] [address] [phone]       Marathon [employer] Date of Birth:  [birth year] Medical Record Number:  [id number]   Reason for Referral:  hospital stroke follow up    CHIEF COMPLAINT:  Chief Complaint  Patient presents with   Hospitalization Follow-up    Alone. Rm 9. Patient would like to know will she still need to take her seizure and cholesterol medication.    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  History obtained from patient and chart review. Reviewed all radiology images and labs personally.  Madeline Brown is a 43 y.o. female with history of ESRD s/p HD who presented to [facility] on 02/20/2019 after having 1 min episode of whole body shaking and frothing at the mouth once home after HD. She has no hx seizure. She was brought to the ED. EEG showed left parietal occipital sharp waves and left posterior quadrant activity and initiated Keppra 1000 mg daily.  CT head showed 2.1 m parietal mass with medium density rim and vasogenic edema.  MRI showed 20 x 13 mm left parietal mass with subacute hemorrhage and mild vasogenic edema.  MRI showed torturous right ICA without vascular lesion and left VA stenosis/occlusion.  Carotid Dopplers unremarkable.  2D echo previously performed 10/2018 with normal EF and RA & LA dilated.  LDL 138.  A1c 4.3.  ICH likely secondary to uncontrolled HTN likely 49 to 42 weeks old.  UDS positive for THC.  No antithrombotic therapy recommended due to hemorrhage.  Recommended repeat MRI w/wo outpatient to assess for underlying source of hemorrhage.  HTN stable and continued amlodipine, Coreg and spironolactone. Initiated atorvastatin 20 mg daily for HLD management.  No prior history or evidence of DM.  Current tobacco user smoking cessation  counseling provided.  Other stroke risk factors include EtOH use and THC use.  Discharged home with recommendations of home health PT.  Madeline Brown is being seen today for hospital follow-up.  She has been stable since discharge without reoccurring or new stroke/TIA symptoms.  She has continued on Keppra 1000 mg daily without recurrent seizure activity.  She is questioning need of continuation of Keppra at this time.  She has continued on atorvastatin without side effects or myalgias.  She is questioning need of continuation statin therapy.  Blood pressure today elevated at 162/98, asymptomatic.  She has not taken her a.m. antihypertensives as she will usually take them around 10 AM (currently 8 AM) due to dialysis.  She does not currently monitor at home but is planning on obtaining cuff this Friday to monitor at home.  She does have follow-up with PCP on 04/02/2019.  Of note, she does report her mother passed away from a ruptured aneurysm and also had history of seizures.  No further concerns at this time.     ROS:   14 system review of systems performed and negative with exception of no concerns  PMH:  Past Medical History:  Diagnosis Date   Anemia    Chronic kidney disease    Hypertension     PSH:  Past Surgical History:  Procedure Laterality Date   AV FISTULA PLACEMENT Left 06/24/2017   Procedure: CREATION OF LEFT ARM BRACHIALCEPHALIC  ARTERIOVENOUS (AV) FISTULA;  Surgeon: Rosetta Posner, MD;  Location: [facility];  Service: Vascular;  Laterality: Left;   CESAREAN SECTION     x2   FISTULA SUPERFICIALIZATION Left 10/28/2017   Procedure: SUPERFICIALIZATION LEFT BRACHIOCEPHALIC ARTERIOVENOUS FISTULA;  Surgeon: Rosetta Posner, MD;  Location: [facility] OR;  Service: Vascular;  Laterality: Left;   INSERTION OF DIALYSIS CATHETER Right 06/24/2017   Procedure: INSERTION OF TUNNELED DIALYSIS CATHETER;  Surgeon: Rosetta Posner, MD;  Location: [facility] OR;  Service: Vascular;  Laterality: Right;   LIGATION OF  COMPETING BRANCHES OF ARTERIOVENOUS FISTULA  10/28/2017   Procedure: LIGATION OF COMPETING BRANCHES OF ARTERIOVENOUS FISTULA x3;  Surgeon: Rosetta Posner, MD;  Location: [facility] OR;  Service: Vascular;;    Social History:  Social History   Socioeconomic History   Marital status: Single    Spouse name: Not on file   Number of children: Not on file   Years of education: Not on file   Highest education level: Not on file  Occupational History   Not on file  Social Needs   Financial resource strain: Not on file   Food insecurity    Worry: Not on file    Inability: Not on file   Transportation needs    Medical: Not on file    Non-medical: Not on file  Tobacco Use   Smoking status: Current Every Day Smoker    Packs/day: 0.25    Years: 15.00    Pack years: 3.75    Types: Cigarettes   Smokeless tobacco: Never Used   Tobacco comment: 4 cigarettes per day  Substance and Sexual Activity   Alcohol use: Yes    Comment: occassional beer   Drug use: Yes    Types: Marijuana   Sexual activity: Yes    Birth control/protection: None  Lifestyle   Physical activity    Days per week: Not on file    Minutes per session: Not on file   Stress: Not on file  Relationships   Social connections    Talks on phone: Not on file    Gets together: Not on file    Attends religious service: Not on file    Active member of club or organization: Not on file    Attends meetings of clubs or organizations: Not on file    Relationship status: Not on file   Intimate partner violence    Fear of current or ex partner: Not on file    Emotionally abused: Not on file    Physically abused: Not on file    Forced sexual activity: Not on file  Other Topics Concern   Not on file  Social History Narrative   Not on file    Family History:  Family History  Problem Relation Age of Onset   Diabetes Father    Hypertension Father    Cancer Father    Heart disease Father    Aneurysm Mother     Seizures Mother     Medications:   Current Outpatient Medications on File Prior to Visit  Medication Sig Dispense Refill   amLODipine (NORVASC) 10 MG tablet Take 10 mg by mouth at bedtime.     calcitRIOL (ROCALTROL) 0.5 MCG capsule Take 4 capsules (2 mcg total) by mouth Every Tuesday,Thursday,and Saturday with dialysis. 48 capsule 0   carvedilol (COREG) 25 MG tablet Take 1 tablet (25 mg total) by mouth 2 (two) times daily with a meal. 60 tablet 0   hydrALAZINE (APRESOLINE) 100 MG tablet Take 100 mg by mouth 2 (two) times daily.     lidocaine-prilocaine (  EMLA) cream Apply 1 application topically See admin instructions. Apply small amount to access site one hour before dialysis (Tuesday, Thursday, Saturday). Cover with occlusive dressing (Saran wrap)     multivitamin (RENA-VIT) TABS tablet Take 1 tablet by mouth at bedtime. 30 tablet 0   sevelamer carbonate (RENVELA) 800 MG tablet Take 2 tablets (1,600 mg total) by mouth 3 (three) times daily with meals. 30 tablet 0   spironolactone (ALDACTONE) 50 MG tablet Take 50 mg by mouth daily.     No current facility-administered medications on file prior to visit.     Allergies:   Allergies  Allergen Reactions   Visine [Tetrahydrozoline Hcl] Swelling    Eyes Swelling     Physical Exam  Vitals:   03/23/19 0756  BP: (!) 162/98  Pulse: 74  Temp: (!) 97.1 F (36.2 C)  TempSrc: Oral  Weight: 128 lb 6.4 oz (58.2 kg)  Height: 5' (1.524 m)   Body mass index is 25.08 kg/m. No exam data present  Depression screen [facility] 2/9 03/23/2019  Decreased Interest 0  Down, Depressed, Hopeless 0  PHQ - 2 Score 0     General: well developed, well nourished,  very pleasant middle-aged African-American female, seated, in no evident distress Head: head normocephalic and atraumatic.   Neck: supple with no carotid or supraclavicular bruits; Cardiovascular: regular rate and rhythm, no murmurs; L arm fistula +bruit, +thrill Musculoskeletal: no  deformity Skin:  no rash/petichiae Vascular:  Normal pulses all extremities   Neurologic Exam Mental Status: Awake and fully alert. Oriented to place and time. Recent and remote memory intact. Attention span, concentration and fund of knowledge appropriate. Mood and affect appropriate.  Cranial Nerves: Fundoscopic exam reveals sharp disc margins. Pupils equal, briskly reactive to light. Extraocular movements full without nystagmus. Visual fields full to confrontation. Hearing intact. Facial sensation intact. Face, tongue, palate moves normally and symmetrically.  Motor: Normal bulk and tone. Normal strength in all tested extremity muscles. Sensory.: intact to touch , pinprick , position and vibratory sensation.  Coordination: Rapid alternating movements normal in all extremities. Finger-to-nose and heel-to-shin performed accurately bilaterally. Gait and Station: Arises from chair without difficulty. Stance is normal. Gait demonstrates normal stride length and balance Reflexes: 1+ and symmetric. Toes downgoing.     NIHSS  0 Modified Rankin  0    Diagnostic Data (Labs, Imaging, Testing)  CT HEAD WO CONTRAST 02/19/2019 IMPRESSION: 1. 2.1 cm left parietal mass with a peripheral rim of medium density and associated vasogenic edema. Differential considerations include primary brain malignancy, metastasis and abscess. Further evaluation with pre and postcontrast MRI of the brain is recommended. 2. No intracranial hemorrhage. 3. Mild diffuse cerebral and cerebellar atrophy. 4. Minimal chronic small vessel white matter ischemic changes in both cerebral hemispheres.   MR BRAIN WO CONTRAST 02/20/2019 IMPRESSION: The 20 x 13 mm left parietal mass is a subacute hematoma with mild vasogenic edema. There have been multiple remote hemorrhages and lacunar infarcts in the posterior circulation likely related to history of hypertension. No evidence of underlying mass, infarct, or venous occlusion  - consider follow-up noncontrast brain MRI or head CT with contrast given history of end-stage renal disease.  MR MRA HEAD  02/20/2019 IMPRESSION: 1. Tortuous distal right internal carotid artery. This likely reflects sequela of chronic hypertension. 2. No focal vascular lesion to explain the patient's distal hemorrhage. 3. High-grade stenosis or occlusion of the distal left vertebral arterIMPRESSIONS  ECHOCARDIOGRAM 11/11/2018  1. The left ventricle has hyperdynamic systolic function,  with an ejection fraction of >65%. The cavity size was normal. There is moderate concentric left ventricular hypertrophy. Left ventricular diastolic Doppler parameters are consistent with  impaired relaxation.  2. The right ventricle has normal systolic function. The cavity was normal. There is no increase in right ventricular wall thickness.  3. Left atrial size was moderately dilated.  4. Right atrial size was mildly dilated.  5. No evidence of mitral valve stenosis.  6. No stenosis of the aortic valve.  7. The aortic root and ascending aorta are normal in size and structure.  8. The average left ventricular global longitudinal strain is -15.1 %.  9. The interatrial septum was not assessed.y.  EEG 02/19/2019 Clinical Interpretation: This EEG recorded evidence of a potential seizure focus in the left parieto-occipital region.  There is also evidence of focal cerebral dysfunction in the left posterior quadrant which could be consistent with the patient's known mass lesion there or with postictal state.   Lipid Panel     Component Value Date/Time   CHOL 233 (H) 02/21/2019 0425   TRIG 50 02/21/2019 0425   HDL 85 02/21/2019 0425   CHOLHDL 2.7 02/21/2019 0425   VLDL 10 02/21/2019 0425   LDLCALC 138 (H) 02/21/2019 0425   Urinalysis    Component Value Date/Time   COLORURINE YELLOW 02/19/2019 1545   APPEARANCEUR CLOUDY (A) 02/19/2019 1545   LABSPEC 1.011 02/19/2019 1545   PHURINE 9.0 (H) 02/19/2019  1545   GLUCOSEU NEGATIVE 02/19/2019 1545   HGBUR NEGATIVE 02/19/2019 1545   BILIRUBINUR NEGATIVE 02/19/2019 1545   KETONESUR NEGATIVE 02/19/2019 1545   PROTEINUR >=300 (A) 02/19/2019 1545   UROBILINOGEN 0.2 06/17/2016 1257   NITRITE NEGATIVE 02/19/2019 1545   LEUKOCYTESUR NEGATIVE 02/19/2019 1545   Drugs of Abuse     Component Value Date/Time   LABOPIA NONE DETECTED 02/19/2019 1545   COCAINSCRNUR NONE DETECTED 02/19/2019 1545   LABBENZ POSITIVE (A) 02/19/2019 1545   AMPHETMU NONE DETECTED 02/19/2019 1545   THCU POSITIVE (A) 02/19/2019 1545   LABBARB NONE DETECTED 02/19/2019 1545          ASSESSMENT: Madeline Brown is a 43 y.o. 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 Vascular risk factors include HTN, HLD, tobacco use, EtOH use, THC use and ESRD on HD.  Has been stable from a neurological standpoint without residual deficits or reoccurring/new stroke/TIA symptoms.  No recurrent seizure activity with ongoing use of Keppra.    PLAN:  1. ICH: We will obtain MRI wo contrast due to end-stage renal function to assess for potential underlying etiology as recommended at hospital discharge.  Avoid aspirin, aspirin-containing products and ibuprofen products due to [place].  No indication for antithrombotic as no prior history of stroke, CAD or stents.  Continue Lipitor 20 mg daily for secondary stroke prevention. Maintain strict control of hypertension with blood pressure goal below 130/90, diabetes with hemoglobin A1c goal below 6.5% and cholesterol with LDL cholesterol (bad cholesterol) goal below 70 mg/dL.  I also advised the patient to eat a healthy diet with plenty of whole grains, cereals, fruits and vegetables, exercise regularly with at least 30 minutes of continuous activity daily and maintain ideal body weight. 2. Seizure, post ICH: Continuation of  Keppra 1000 mg twice daily -refill provided.  Repeat EEG.  Discussion regarding recommendation of continuation of Keppra due to possible reoccurring seizure activity.  May consider decreasing  dose in the future.  She prefers to continue Keppra as she is active and has younger children.  Advised her per [place] law, no driving for 6 months post seizure activity 3. HTN: Advised to continue current treatment regimen.  Today's BP elevated but asymptomatic and likely due to not taking antihypertensives yet this morning due to routine schedule.  Advised to  monitor at home along with continued follow-up with PCP for management 4. HLD: Advised to continue current treatment regimen along with continued follow-up with PCP for future prescribing and monitoring of lipid panel.  Will obtain lipid panel to assess for improvement of LDL.  Discussion regarding recommendation of ongoing use of atorvastatin for secondary stroke prevention    Follow up in 3 months or call earlier if needed   Greater than 50% of time during this 45 minute visit was spent on counseling, explanation of diagnosis of [place] with subsequent seizure activity, reviewing risk factor management of HTN and HLD, discussion regarding indication for repeat imaging and EEG, planning of further management along with potential future management, and discussion with patient and answering all questions to patient satisfaction    Frann Rider, AGNP-BC  [facility] [address] [place], [place] [address]  Phone [phone] Fax [phone] Note: This document was prepared with digital dictation and possible smart phrase technology. Any transcriptional errors that result from this process are unintentional.

## 2019-03-24 ENCOUNTER — Telehealth: Payer: Self-pay | Admitting: *Deleted

## 2019-03-24 LAB — LIPID PANEL
Chol/HDL Ratio: 1.8 ratio (ref 0.0–4.4)
Cholesterol, Total: 172 mg/dL (ref 100–199)
HDL: 93 mg/dL (ref >39)
LDL Chol Calc (NIH): 68 mg/dL (ref 0–99)
Triglycerides: 55 mg/dL (ref 0–149)
VLDL Cholesterol Cal: 11 mg/dL (ref 5–40)

## 2019-03-24 NOTE — Telephone Encounter (Signed)
I LMVM for pt that her lipid panel / cholesterol levels showed great improvement with goal less then 70.  She is to continue her current regimen.  She can call back if questions.

## 2019-03-24 NOTE — Telephone Encounter (Signed)
-----   Message from Frann Rider, NP sent at 03/24/2019  9:48 AM EDT ----- Please advise patient that her recent cholesterol levels showed great improvement of her LDL or bad cholesterol as her levels are under the goal of less than 70.  Please advise her to continue her current treatment regimen at this time.

## 2019-03-24 NOTE — Telephone Encounter (Signed)
I LMVM for pt to call back for lab results.

## 2019-04-11 ENCOUNTER — Encounter: Payer: Self-pay | Admitting: Adult Health

## 2019-04-11 ENCOUNTER — Other Ambulatory Visit: Payer: Medicare Other

## 2019-04-15 DIAGNOSIS — Z949 Transplanted organ and tissue status, unspecified: Secondary | ICD-10-CM | POA: Insufficient documentation

## 2019-04-27 ENCOUNTER — Other Ambulatory Visit: Payer: Self-pay

## 2019-04-27 ENCOUNTER — Other Ambulatory Visit (INDEPENDENT_AMBULATORY_CARE_PROVIDER_SITE_OTHER): Payer: Medicare Other

## 2019-04-27 DIAGNOSIS — R569 Unspecified convulsions: Secondary | ICD-10-CM

## 2019-05-03 ENCOUNTER — Telehealth: Payer: Self-pay

## 2019-05-03 NOTE — Telephone Encounter (Signed)
Called pt with results to their EEG Per NP Frann Rider. Spoke with pt. She demonstrated understanding.  "Notes recorded by Frann Rider, NP on 05/03/2019 at 8:01 AM EST  Recent EEG did not show evidence of seizure activity. Please continue current regimen with Keppra"

## 2019-06-23 ENCOUNTER — Ambulatory Visit: Payer: Medicare Other | Admitting: Adult Health

## 2019-06-23 ENCOUNTER — Telehealth: Payer: Self-pay

## 2019-06-23 NOTE — Progress Notes (Deleted)
[facility] [address] [place]. [place] [address] [phone]       OFFICE FOLLOW UP NOTE  [name] Date of Birth:  May 03, 1976 Medical Record Number:  [id number]   Reason for Referral:  stroke follow up    CHIEF COMPLAINT:  No chief complaint on file.   HPI:  Stroke admission 02/20/2019: [name] is a 45 y.o. female with history of ESRD s/p HD who presented to [facility] on 02/20/2019 after having 1 min episode of whole body shaking and frothing at the mouth once home after HD. She has no hx seizure. She was brought to the ED. EEG showed left parietal occipital sharp waves and left posterior quadrant activity and initiated Keppra 1000 mg daily.  CT head showed 2.1 m parietal mass with medium density rim and vasogenic edema.  MRI showed 20 x 13 mm left parietal mass with subacute hemorrhage and mild vasogenic edema.  MRI showed torturous right ICA without vascular lesion and left VA stenosis/occlusion.  Carotid Dopplers unremarkable.  2D echo previously performed 10/2018 with normal EF and RA & LA dilated.  LDL 138.  A1c 4.3.  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  UDS positive for THC.  No antithrombotic therapy recommended due to hemorrhage.  Recommended repeat MRI w/wo outpatient to assess for underlying source of hemorrhage.  HTN stable and continued amlodipine, Coreg and spironolactone. Initiated atorvastatin 20 mg daily for HLD management.  No prior history or evidence of DM.  Current tobacco user smoking cessation counseling provided.  Other stroke risk factors include EtOH use and THC use.  Discharged home with recommendations of home health PT.  Initial visit 03/23/2019: [name] is being seen today for hospital follow-up.  She has been stable since discharge without reoccurring or new stroke/TIA symptoms.  She has continued on Keppra 1000 mg daily without recurrent seizure activity.  She is questioning need of continuation of  Keppra at this time.  She has continued on atorvastatin without side effects or myalgias.  She is questioning need of continuation statin therapy.  Blood pressure today elevated at 162/98, asymptomatic.  She has not taken her a.m. antihypertensives as she will usually take them around 10 AM (currently 8 AM) due to dialysis.  She does not currently monitor at home but is planning on obtaining cuff this Friday to monitor at home.  She does have follow-up with PCP on 04/02/2019.  Of note, she does report her mother passed away from a ruptured aneurysm and also had history of seizures.  No further concerns at this time.   Update 06/23/2019: [name] is a 44 year old female who is being seen today for stroke follow-up.  She has been doing well from a stroke standpoint without new or reoccurring stroke/TIA symptoms.  She did undergo EEG on 05/02/2019 which did not show any abnormalities or seizure activity and continues on Keppra 1000 mg daily.  She is tolerating Keppra well without any side effects and has not had any recent seizure activity.  Blood pressure today ***.  Lipid panel obtained at prior visit which showed LDL 68.  An MRI was ordered at prior visit to assess for potential underlying ICH etiologies but this has not been completed at this time.  She continues to receive HD 3 times weekly.  No further concerns at this time.       ROS:   14 system review of systems performed and negative with exception of  no concerns  PMH:  Past Medical History:  Diagnosis Date  . Anemia   . Chronic kidney disease   . Hypertension     PSH:  Past Surgical History:  Procedure Laterality Date  . AV FISTULA PLACEMENT Left 06/24/2017   Procedure: CREATION OF LEFT ARM BRACHIALCEPHALIC  ARTERIOVENOUS (AV) FISTULA;  Surgeon: Rosetta Posner, MD;  Location: [place];  Service: Vascular;  Laterality: Left;  . CESAREAN SECTION     x2  . FISTULA SUPERFICIALIZATION Left 10/28/2017   Procedure: SUPERFICIALIZATION LEFT  BRACHIOCEPHALIC ARTERIOVENOUS FISTULA;  Surgeon: Rosetta Posner, MD;  Location: [place];  Service: Vascular;  Laterality: Left;  . INSERTION OF DIALYSIS CATHETER Right 06/24/2017   Procedure: INSERTION OF TUNNELED DIALYSIS CATHETER;  Surgeon: Rosetta Posner, MD;  Location: [facility];  Service: Vascular;  Laterality: Right;  . LIGATION OF COMPETING BRANCHES OF ARTERIOVENOUS FISTULA  10/28/2017   Procedure: LIGATION OF COMPETING BRANCHES OF ARTERIOVENOUS FISTULA x3;  Surgeon: Rosetta Posner, MD;  Location: [facility];  Service: Vascular;;    Social History:  Social History   Socioeconomic History  . Marital status: Single    Spouse name: Not on file  . Number of children: Not on file  . Years of education: Not on file  . Highest education level: Not on file  Occupational History  . Not on file  Tobacco Use  . Smoking status: Current Every Day Smoker    Packs/day: 0.25    Years: 15.00    Pack years: 3.75    Types: Cigarettes  . Smokeless tobacco: Never Used  . Tobacco comment: 4 cigarettes per day  Substance and Sexual Activity  . Alcohol use: Yes    Comment: occassional beer  . Drug use: Yes    Types: Marijuana  . Sexual activity: Yes    Birth control/protection: None  Other Topics Concern  . Not on file  Social History Narrative  . Not on file   Social Determinants of Health   Financial Resource Strain:   . Difficulty of Paying Living Expenses: Not on file  Food Insecurity:   . Worried About Charity fundraiser in the Last Year: Not on file  . Ran Out of Food in the Last Year: Not on file  Transportation Needs:   . Lack of Transportation (Medical): Not on file  . Lack of Transportation (Non-Medical): Not on file  Physical Activity:   . Days of Exercise per Week: Not on file  . Minutes of Exercise per Session: Not on file  Stress:   . Feeling of Stress : Not on file  Social Connections:   . Frequency of Communication with Friends and Family: Not on file  . Frequency of Social  Gatherings with Friends and Family: Not on file  . Attends Religious Services: Not on file  . Active Member of Clubs or Organizations: Not on file  . Attends Archivist Meetings: Not on file  . Marital Status: Not on file  Intimate Partner Violence:   . Fear of Current or Ex-Partner: Not on file  . Emotionally Abused: Not on file  . Physically Abused: Not on file  . Sexually Abused: Not on file    Family History:  Family History  Problem Relation Age of Onset  . Diabetes Father   . Hypertension Father   . Cancer Father   . Heart disease Father   . Aneurysm Mother   . Seizures Mother     Medications:  Current Outpatient Medications on File Prior to Visit  Medication Sig Dispense Refill  . amLODipine (NORVASC) 10 MG tablet Take 10 mg by mouth at bedtime.    Marland Kitchen atorvastatin (LIPITOR) 20 MG tablet Take 1 tablet (20 mg total) by mouth daily at 6 PM. 30 tablet 3  . carvedilol (COREG) 25 MG tablet Take 1 tablet (25 mg total) by mouth 2 (two) times daily with a meal. 60 tablet 0  . hydrALAZINE (APRESOLINE) 100 MG tablet Take 100 mg by mouth 2 (two) times daily.    Marland Kitchen levETIRAcetam (KEPPRA) 1000 MG tablet Take 1 tablet (1,000 mg total) by mouth daily. 90 tablet 3  . lidocaine-prilocaine (EMLA) cream Apply 1 application topically See admin instructions. Apply small amount to access site one hour before dialysis (Tuesday, Thursday, Saturday). Cover with occlusive dressing (Saran wrap)    . multivitamin (RENA-VIT) TABS tablet Take 1 tablet by mouth at bedtime. 30 tablet 0  . sevelamer carbonate (RENVELA) 800 MG tablet Take 2 tablets (1,600 mg total) by mouth 3 (three) times daily with meals. 30 tablet 0  . spironolactone (ALDACTONE) 50 MG tablet Take 50 mg by mouth daily.     No current facility-administered medications on file prior to visit.    Allergies:   Allergies  Allergen Reactions  . Visine [Tetrahydrozoline Hcl] Swelling    Eyes Swelling     Physical Exam  There  were no vitals filed for this visit. There is no height or weight on file to calculate BMI. No exam data present  Depression screen [facility] 2/9 03/23/2019  Decreased Interest 0  Down, Depressed, Hopeless 0  PHQ - 2 Score 0     General: well developed, well nourished,  very pleasant middle-aged African-American female, seated, in no evident distress Head: head normocephalic and atraumatic.   Neck: supple with no carotid or supraclavicular bruits; Cardiovascular: regular rate and rhythm, no murmurs; L arm fistula +bruit, +thrill Musculoskeletal: no deformity Skin:  no rash/petichiae Vascular:  Normal pulses all extremities   Neurologic Exam Mental Status: Awake and fully alert. Oriented to place and time. Recent and remote memory intact. Attention span, concentration and fund of knowledge appropriate. Mood and affect appropriate.  Cranial Nerves: Fundoscopic exam reveals sharp disc margins. Pupils equal, briskly reactive to light. Extraocular movements full without nystagmus. Visual fields full to confrontation. Hearing intact. Facial sensation intact. Face, tongue, palate moves normally and symmetrically.  Motor: Normal bulk and tone. Normal strength in all tested extremity muscles. Sensory.: intact to touch , pinprick , position and vibratory sensation.  Coordination: Rapid alternating movements normal in all extremities. Finger-to-nose and heel-to-shin performed accurately bilaterally. Gait and Station: Arises from chair without difficulty. Stance is normal. Gait demonstrates normal stride length and balance Reflexes: 1+ and symmetric. Toes downgoing.     NIHSS  0 Modified Rankin  0    Diagnostic Data (Labs, Imaging, Testing)  CT HEAD WO CONTRAST 02/19/2019 IMPRESSION: 1. 2.1 cm left parietal mass with a peripheral rim of medium density and associated vasogenic edema. Differential considerations include primary brain malignancy, metastasis and abscess. Further evaluation with pre  and postcontrast MRI of the brain is recommended. 2. No intracranial hemorrhage. 3. Mild diffuse cerebral and cerebellar atrophy. 4. Minimal chronic small vessel white matter ischemic changes in both cerebral hemispheres.   MR BRAIN WO CONTRAST 02/20/2019 IMPRESSION: The 20 x 13 mm left parietal mass is a subacute hematoma with mild vasogenic edema. There have been multiple remote hemorrhages and lacunar infarcts  in the posterior circulation likely related to history of hypertension. No evidence of underlying mass, infarct, or venous occlusion - consider follow-up noncontrast brain MRI or head CT with contrast given history of end-stage renal disease.  MR MRA HEAD  02/20/2019 IMPRESSION: 1. Tortuous distal right internal carotid artery. This likely reflects sequela of chronic hypertension. 2. No focal vascular lesion to explain the patient's distal hemorrhage. 3. High-grade stenosis or occlusion of the distal left vertebral arterIMPRESSIONS  ECHOCARDIOGRAM 11/11/2018  1. The left ventricle has hyperdynamic systolic function, with an ejection fraction of >65%. The cavity size was normal. There is moderate concentric left ventricular hypertrophy. Left ventricular diastolic Doppler parameters are consistent with  impaired relaxation.  2. The right ventricle has normal systolic function. The cavity was normal. There is no increase in right ventricular wall thickness.  3. Left atrial size was moderately dilated.  4. Right atrial size was mildly dilated.  5. No evidence of mitral valve stenosis.  6. No stenosis of the aortic valve.  7. The aortic root and ascending aorta are normal in size and structure.  8. The average left ventricular global longitudinal strain is -15.1 %.  9. The interatrial septum was not assessed.y.  EEG 02/19/2019 Clinical Interpretation: This EEG recorded evidence of a potential seizure focus in the left parieto-occipital region.  There is also evidence of focal  cerebral dysfunction in the left posterior quadrant which could be consistent with the patient's known mass lesion there or with postictal state.   Lipid Panel     Component Value Date/Time   CHOL 172 03/23/2019 0839   TRIG 55 03/23/2019 0839   HDL 93 03/23/2019 0839   CHOLHDL 1.8 03/23/2019 0839   CHOLHDL 2.7 02/21/2019 0425   VLDL 10 02/21/2019 0425   LDLCALC 68 03/23/2019 0839   LABVLDL 11 03/23/2019 0839   Urinalysis    Component Value Date/Time   COLORURINE YELLOW 02/19/2019 1545   APPEARANCEUR CLOUDY (A) 02/19/2019 1545   LABSPEC 1.011 02/19/2019 1545   PHURINE 9.0 (H) 02/19/2019 1545   GLUCOSEU NEGATIVE 02/19/2019 1545   HGBUR NEGATIVE 02/19/2019 1545   BILIRUBINUR NEGATIVE 02/19/2019 1545   KETONESUR NEGATIVE 02/19/2019 1545   PROTEINUR >=300 (A) 02/19/2019 1545   UROBILINOGEN 0.2 06/17/2016 1257   NITRITE NEGATIVE 02/19/2019 1545   LEUKOCYTESUR NEGATIVE 02/19/2019 1545   Drugs of Abuse     Component Value Date/Time   LABOPIA NONE DETECTED 02/19/2019 1545   COCAINSCRNUR NONE DETECTED 02/19/2019 1545   LABBENZ POSITIVE (A) 02/19/2019 1545   AMPHETMU NONE DETECTED 02/19/2019 1545   THCU POSITIVE (A) 02/19/2019 1545   LABBARB NONE DETECTED 02/19/2019 1545          ASSESSMENT: Madeline Brown is a 44 y.o. 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 Vascular risk factors include HTN, HLD, tobacco use, EtOH use, THC use and ESRD on HD.  Has been stable from a neurological standpoint without residual deficits or reoccurring/new stroke/TIA symptoms.  No recurrent seizure activity with ongoing use of Keppra.    PLAN:  1. ICH: We will obtain MRI wo contrast due to end-stage renal function to assess for potential underlying etiology as recommended at hospital discharge.  Avoid aspirin, aspirin-containing products and ibuprofen  products due to [place].  No indication for antithrombotic as no prior history of stroke, CAD or stents.  Continue Lipitor 20 mg daily for secondary  stroke prevention. Maintain strict control of hypertension with blood pressure goal below 130/90, diabetes with hemoglobin A1c goal below 6.5% and cholesterol with LDL cholesterol (bad cholesterol) goal below 70 mg/dL.  I also advised the patient to eat a healthy diet with plenty of whole grains, cereals, fruits and vegetables, exercise regularly with at least 30 minutes of continuous activity daily and maintain ideal body weight. 2. Seizure, post ICH: Continuation of Keppra 1000 mg daily -refill provided. Discussion regarding recommendation of continuation of Keppra due to possible reoccurring seizure activity.  May consider decreasing dose in the future.  She prefers to continue Keppra as she is active and has younger children.  Advised her per [place] law, no driving for 6 months post seizure activity 3. HTN: Advised to continue current treatment regimen.  Today's BP elevated but asymptomatic and likely due to not taking antihypertensives yet this morning due to routine schedule.  Advised to  monitor at home along with continued follow-up with PCP for management 4. HLD: Advised to continue current treatment regimen along with continued follow-up with PCP for future prescribing and monitoring of lipid panel.  Will obtain lipid panel to assess for improvement of LDL.  Discussion regarding recommendation of ongoing use of atorvastatin for secondary stroke prevention    Follow up in 3 months or call earlier if needed   Greater than 50% of time during this 45 minute visit was spent on counseling, explanation of diagnosis of [place] with subsequent seizure activity, reviewing risk factor management of HTN and HLD, discussion regarding indication for repeat imaging and EEG, planning of further management along with potential future management, and discussion with patient and  answering all questions to patient satisfaction    Frann Rider, AGNP-BC  [facility] [address] [place], [place] [address]  Phone [phone] Fax [phone] Note: This document was prepared with digital dictation and possible smart phrase technology. Any transcriptional errors that result from this process are unintentional.

## 2019-06-23 NOTE — Telephone Encounter (Signed)
Patient was a no call/no show for their appointment today.   

## 2019-06-27 ENCOUNTER — Encounter: Payer: Self-pay | Admitting: Adult Health

## 2019-08-08 ENCOUNTER — Telehealth: Payer: Self-pay

## 2019-08-08 NOTE — Telephone Encounter (Signed)
Per Madeline Brown last office note, patient should get this medication refilled by her PCP.

## 2019-08-15 ENCOUNTER — Other Ambulatory Visit: Payer: Self-pay

## 2019-08-15 NOTE — Telephone Encounter (Signed)
Received another fax from [place] where the patient is requesting a refill on her atorvastatin (LIPITOR) 20 MG tablet. Per Jessica's last in office note patient needs to have this medication filled by her pcp.

## 2019-09-06 ENCOUNTER — Ambulatory Visit: Payer: Medicare HMO | Admitting: Adult Health

## 2019-10-03 ENCOUNTER — Telehealth: Payer: Self-pay | Admitting: Adult Health

## 2019-10-03 ENCOUNTER — Ambulatory Visit: Payer: Medicare HMO | Admitting: Adult Health

## 2019-10-03 NOTE — Telephone Encounter (Signed)
Previously seen in 03/2019 for history of stroke and seizures.  At prior visit, current use of Keppra therefore importance of routine neurologic visits for monitoring and management.  Can you please contact patient in regards to ongoing missed appointments to assess for any barriers that are causing no-shows.  Possibly virtual visit may be easier for her at this time?  If she is routinely being seen by PCP who is okay with prescribing and managing medication for seizure prophylaxis, can you please update PCP in system and she can follow-up with [place] as needed at that time.  Thank you.

## 2019-10-03 NOTE — Telephone Encounter (Signed)
FYI Patient no-showed today for her appointment for the 3rd time since 03/2019.

## 2019-10-05 ENCOUNTER — Other Ambulatory Visit: Payer: Self-pay | Admitting: Internal Medicine

## 2019-10-05 DIAGNOSIS — R569 Unspecified convulsions: Secondary | ICD-10-CM

## 2019-10-06 NOTE — Telephone Encounter (Signed)
I called patient and she stated that she was just unsure of when her appointment was. She said that she tried to refill her Keppra and the pharmacy told her no. I advised patient that this is most likely because she has not been seen by our office since her last refill, and in order to get a refill she needs to make an appointment. I offered her an appointment on Monday and she agreed to take the appointment. I advised patient of check-in time.

## 2019-10-06 NOTE — Telephone Encounter (Signed)
Sounds good. Thank you

## 2019-10-10 ENCOUNTER — Ambulatory Visit (INDEPENDENT_AMBULATORY_CARE_PROVIDER_SITE_OTHER): Payer: Medicare HMO | Admitting: Adult Health

## 2019-10-10 ENCOUNTER — Other Ambulatory Visit: Payer: Self-pay

## 2019-10-10 ENCOUNTER — Encounter: Payer: Self-pay | Admitting: Adult Health

## 2019-10-10 VITALS — BP 152/102 | HR 88 | Ht 60.0 in | Wt 132.0 lb

## 2019-10-10 DIAGNOSIS — E785 Hyperlipidemia, unspecified: Secondary | ICD-10-CM | POA: Diagnosis not present

## 2019-10-10 DIAGNOSIS — R569 Unspecified convulsions: Secondary | ICD-10-CM

## 2019-10-10 DIAGNOSIS — I611 Nontraumatic intracerebral hemorrhage in hemisphere, cortical: Secondary | ICD-10-CM

## 2019-10-10 DIAGNOSIS — I10 Essential (primary) hypertension: Secondary | ICD-10-CM | POA: Diagnosis not present

## 2019-10-10 MED ORDER — LEVETIRACETAM ER 1000 MG PO TB24: 1000.0000 mg | ORAL_TABLET | Freq: Every day | ORAL | 3 refills | Status: DC

## 2019-10-10 NOTE — Progress Notes (Signed)
[facility] [address] [place]. [place] [address] [phone]       STROKE FOLLOW UP NOTE  [name] Date of Birth:  [birth year] Medical Record Number:  [id number]   Reason for Referral: Left ICH follow up    CHIEF COMPLAINT:  Chief Complaint  Patient presents with  . Follow-up    RM 9,  with daughter. Out of lipitor and keppra.    HPI:   Today, 10/10/2019, Madeline Brown returns for follow-up regarding left ICH with subsequent seizure activity in 02/2019 after multiple prior no-show visits.  She has been stable since prior visit without residual stroke deficits or new/reoccurring stroke/TIA symptoms.  Recommended repeating MRI for possible underlying ICH etiologies with order placed but not completed at this time. Continues on Keppra 1000 mg daily tolerating well with questionable breakthrough seizure 1 week ago with transient speech difficulty lasting approximately 5 to 10 minutes and then resolved after sitting with eyes closed taking deep breaths.  She does report increased stressful event prior and symptoms similar to prior seizure-like activity.  No prior breakthrough seizure.  Continues on atorvastatin for secondary stroke prevention.  Blood pressure today 152/102. She does not routinely monitor at home but again, plans on obtaining cuff to monitor at home. Monitors at HD and range 140-160/103-110 prior to HD and improve after HD.  Continues to receive HD and currently in the process of finding a living donor for kidney transplant.  No concerns at this time.     History provided for reference purposes only Initial visit 03/23/2019 JM: Madeline Brown is being seen today for hospital follow-up.  She has been stable since discharge without reoccurring or new stroke/TIA symptoms.  She has continued on Keppra 1000 mg daily without recurrent seizure activity.  She is questioning need of continuation of Keppra at this time.  She has continued on atorvastatin without  side effects or myalgias.  She is questioning need of continuation statin therapy.  Blood pressure today elevated at 162/98, asymptomatic.  She has not taken her a.m. antihypertensives as she will usually take them around 10 AM (currently 8 AM) due to dialysis.  She does not currently monitor at home but is planning on obtaining cuff this Friday to monitor at home.  She does have follow-up with PCP on 04/02/2019.  Of note, she does report her mother passed away from a ruptured aneurysm and also had history of seizures.  No further concerns at this time.   Stroke admission 02/20/2019: Madeline Brown is a 44 y.o. female with history of ESRD s/p HD who presented to [facility] on 02/20/2019 after having 1 min episode of whole body shaking and frothing at the mouth once home after HD. She has no hx seizure. She was brought to the ED. EEG showed left parietal occipital sharp waves and left posterior quadrant activity and initiated Keppra 1000 mg daily.  CT head showed 2.1 m parietal mass with medium density rim and vasogenic edema.  MRI showed 20 x 13 mm left parietal mass with subacute hemorrhage and mild vasogenic edema estimated 70 to 53 weeks old likely secondary to uncontrolled HTN.  MRI showed torturous right ICA without vascular lesion and left VA stenosis/occlusion.  Carotid Dopplers unremarkable.  2D echo previously performed 10/2018 with normal EF and RA & LA dilated.  LDL 138.  A1c 4.3.  UDS positive for THC.  No antithrombotic therapy recommended due to hemorrhage.  Recommended repeat MRI w/wo outpatient to assess for underlying  source of hemorrhage.  HTN stable and continued amlodipine, Coreg and spironolactone. Initiated atorvastatin 20 mg daily for HLD management.  No prior history or evidence of DM.  Current tobacco user smoking cessation counseling provided.  Other stroke risk factors include EtOH use and THC use.  Discharged home with recommendations of home health PT.  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 No hemorrhage.      MRI  20x13 mm L parietal mass w/ subacute hemorrhage and mild vasogenic edema.   MRA  Tortuous R ICA. No vascular lesion. L VA stenosis/occlusion   Carotid Doppler  B ICA 1-39% stenosis, VAs antegrade   2D Echo 10/2018 EF >65%. RA & LA dilated.  LDL 138 -initiated atorvastatin  HgbA1c 4.3  UDS positive for THC  SCDs for VTE prophylaxis  No antithrombotic prior to admission, now on No antithrombotic given hemorrhage. Follow up at [facility] to consider antiplatelet at that time  Therapy recommendations:  HH PT  Disposition: home  Repeat MRI with and without contrast in 1 month to look for underlying source of hemorrhage at outpt follow up  Seziure   No hx sz  Witnessed seizure at home post HD  EEG 1)left parieto-occiptalsharp waves 2)left posterior quadrant slow activity  On Keppra 1000 daily  Continue keppra on d/c  Follow up with [facility]   No driving unless 6 months free of seizure     ROS:   14 system review of systems performed and negative with exception of no concerns  PMH:  Past Medical History:  Diagnosis Date  . Anemia   . Chronic kidney disease   . Hypertension     PSH:  Past Surgical History:  Procedure Laterality Date  . AV FISTULA PLACEMENT Left 06/24/2017   Procedure: CREATION OF LEFT ARM BRACHIALCEPHALIC  ARTERIOVENOUS (AV) FISTULA;  Surgeon: Rosetta Posner, MD;  Location: [place];  Service: Vascular;  Laterality: Left;  . CESAREAN SECTION     x2  . FISTULA SUPERFICIALIZATION Left 10/28/2017   Procedure: SUPERFICIALIZATION LEFT BRACHIOCEPHALIC ARTERIOVENOUS FISTULA;  Surgeon: Rosetta Posner, MD;  Location: [place];  Service: Vascular;  Laterality: Left;  . INSERTION OF DIALYSIS CATHETER Right 06/24/2017   Procedure: INSERTION OF TUNNELED DIALYSIS CATHETER;  Surgeon: Rosetta Posner, MD;  Location: [place];  Service:  Vascular;  Laterality: Right;  . LIGATION OF COMPETING BRANCHES OF ARTERIOVENOUS FISTULA  10/28/2017   Procedure: LIGATION OF COMPETING BRANCHES OF ARTERIOVENOUS FISTULA x3;  Surgeon: Rosetta Posner, MD;  Location: [place] [facility];  Service: Vascular;;    Social History:  Social History   Socioeconomic History  . Marital status: Single    Spouse name: Not on file  . Number of children: Not on file  . Years of education: Not on file  . Highest education level: Not on file  Occupational History  . Not on file  Tobacco Use  . Smoking status: Current Every Day Smoker    Packs/day: 0.25    Years: 15.00    Pack years: 3.75    Types: Cigarettes  . Smokeless tobacco: Never Used  . Tobacco comment: 4 cigarettes per day  Substance and Sexual Activity  . Alcohol use: Yes    Comment: occassional beer  . Drug use: Yes    Types: Marijuana  . Sexual activity: Yes    Birth control/protection: None  Other Topics Concern  . Not on file  Social History Narrative  . Not on file   Social Determinants of Health   Financial Resource Strain:   . Difficulty of Paying Living Expenses:   Food Insecurity:   . Worried About Charity fundraiser in the Last Year:   . Arboriculturist in the Last Year:   Transportation Needs:   . Film/video editor (Medical):   Marland Kitchen Lack of Transportation (Non-Medical):   Physical Activity:   . Days of Exercise per Week:   . Minutes of Exercise per Session:   Stress:   . Feeling of Stress :   Social Connections:   . Frequency of Communication with Friends and Family:   . Frequency of Social Gatherings with Friends and Family:   . Attends Religious Services:   . Active Member of Clubs or Organizations:   . Attends Archivist Meetings:   Marland Kitchen Marital Status:   Intimate Partner Violence:   . Fear of Current or Ex-Partner:   . Emotionally Abused:   Marland Kitchen Physically Abused:   . Sexually Abused:     Family History:  Family History  Problem Relation Age of Onset   . Diabetes Father   . Hypertension Father   . Cancer Father   . Heart disease Father   . Aneurysm Mother   . Seizures Mother     Medications:   Current Outpatient Medications on File Prior to Visit  Medication Sig Dispense Refill  . amLODipine (NORVASC) 10 MG tablet Take 10 mg by mouth at bedtime.    Marland Kitchen atorvastatin (LIPITOR) 20 MG tablet Take 1 tablet (20 mg total) by mouth daily at 6 PM. 30 tablet 3  . carvedilol (COREG) 25 MG tablet Take 1 tablet (25 mg total) by mouth 2 (two) times daily with a meal. 60 tablet 0  . hydrALAZINE (APRESOLINE) 100 MG tablet Take 100 mg by mouth 2 (two) times daily.    Marland Kitchen lidocaine-prilocaine (EMLA) cream Apply 1 application topically See admin instructions. Apply small amount to access site one hour before dialysis (Tuesday, Thursday, Saturday). Cover with occlusive dressing (Saran wrap)    . multivitamin (RENA-VIT) TABS tablet Take 1 tablet by mouth at bedtime. 30 tablet 0  . sevelamer carbonate (RENVELA) 800 MG tablet Take 2 tablets (1,600 mg total) by mouth 3 (three) times daily with meals. 30 tablet 0  . spironolactone (ALDACTONE) 50 MG tablet Take 50 mg by mouth daily.     No current facility-administered medications on file prior to visit.    Allergies:   Allergies  Allergen Reactions  . Visine [Tetrahydrozoline Hcl] Swelling    Eyes Swelling     Physical Exam  Vitals:   10/10/19 1114  BP: (!) 152/102  Pulse: 88  Weight: 132 lb (59.9 kg)  Height: 5' (1.524 m)   Body mass index is 25.78 kg/m. No exam data present   General: well developed, well nourished, very pleasant middle-aged African-American female, seated, in no evident distress Head: head normocephalic and atraumatic.   Neck: supple with no carotid or supraclavicular bruits; Cardiovascular: regular rate and rhythm, no murmurs; L arm fistula +bruit, +thrill Musculoskeletal: no deformity Skin:  no rash/petichiae Vascular:  Normal pulses all extremities   Neurologic  Exam Mental Status: Awake and fully alert.  Fluent speech and language. Oriented to place and time. Recent and remote memory intact. Attention span, concentration and fund of knowledge appropriate. Mood and affect appropriate.  Cranial  Nerves: Pupils equal, briskly reactive to light. Extraocular movements full without nystagmus. Visual fields full to confrontation. Hearing intact. Facial sensation intact. Face, tongue, palate moves normally and symmetrically.  Motor: Normal bulk and tone. Normal strength in all tested extremity muscles. Sensory.: intact to touch , pinprick , position and vibratory sensation.  Coordination: Rapid alternating movements normal in all extremities. Finger-to-nose and heel-to-shin performed accurately bilaterally. Gait and Station: Arises from chair without difficulty. Stance is normal. Gait demonstrates normal stride length and balance Reflexes: 1+ and symmetric. Toes downgoing.          ASSESSMENT: Madeline Brown is a 44 y.o. 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 Vascular risk factors include HTN, HLD, tobacco use, EtOH use, THC use and ESRD on HD.  Has been stable from a neurological standpoint without residual deficits or reoccurring/new stroke/TIA symptoms.  Questionable breakthrough seizure 1 week prior after stressful event otherwise no additional seizure activity and ongoing compliance of Keppra 1000 mg daily.    PLAN:  1. ICH likely HTN etiology: Repeat MRI wo contrast due to end-stage renal function to assess for potential underlying etiology as recommended at hospital discharge.  Order previously placed but not completed for unknown reasons.  Avoid aspirin, aspirin-containing products and ibuprofen products due to [place].  No indication for antithrombotic as no prior history of stroke, CAD or stents.  Continue Lipitor  20 mg daily for secondary stroke prevention.  Advised refills will need to be provided by PCP.  Maintain strict control of hypertension with blood pressure goal below 130/90, diabetes with hemoglobin A1c goal below 6.5% and cholesterol with LDL cholesterol (bad cholesterol) goal below 70 mg/dL.  I also advised the patient to eat a healthy diet with plenty of whole grains, cereals, fruits and vegetables, exercise regularly with at least 30 minutes of continuous activity daily and maintain ideal body weight. 2. Seizure, post ICH: Continuation of Keppra 24hr tab 1000 mg daily - refill provided.  Discussion regarding avoidance of seizure provoking triggers or activities and is aware to call office with any question of seizure activity or symptoms.  3. HTN: Baseline level per patient.  Highly encourage monitoring at home to ensure satisfactory levels and ongoing follow-up with PCP for monitoring management 4. HLD: Continuation of atorvastatin and ongoing prescribing, monitoring and management through PCP    Follow up in 6 months or call earlier if needed   I spent 26 minutes of face-to-face and non-face-to-face time with patient.  This included previsit chart review, lab review, study review, order entry, electronic health record documentation, patient education     Frann Rider, [facility]  [facility] [address] [place], [place] [address]  Phone [phone] Fax [phone] Note: This document was prepared with digital dictation and possible smart phrase technology. Any transcriptional errors that result from this process are unintentional.

## 2019-10-10 NOTE — Patient Instructions (Addendum)
Continue keppra 1000mg  twice daily for seizure prevention - refill provided  Continue lipitor  for secondary stroke prevention - this medication needs to be refilled by your PCP   Continue to follow up with PCP regarding cholesterol and blood pressure management   Highly recommend starting to monitor blood pressure at home to ensure satisfactory management and levels  Obtain MRI brain to look for underlying possible cause of your brain bleed  Maintain strict control of hypertension with blood pressure goal below 130/90, diabetes with hemoglobin A1c goal below 6.5% and cholesterol with LDL cholesterol (bad cholesterol) goal below 70 mg/dL. I also advised the patient to eat a healthy diet with plenty of whole grains, cereals, fruits and vegetables, exercise regularly and maintain ideal body weight.  Followup in the future with me in 6 months or call earlier if needed       Thank you for coming to see [place] at [facility]. I hope we have been able to provide you high quality care today.  You may receive a patient satisfaction survey over the next few weeks. We would appreciate your feedback and comments so that we may continue to improve ourselves and the health of our patients.

## 2019-10-13 ENCOUNTER — Telehealth: Payer: Self-pay | Admitting: Adult Health

## 2019-10-13 NOTE — Telephone Encounter (Signed)
[employer] pending faxed notes.  

## 2019-10-17 NOTE — Progress Notes (Signed)
I agree with the above plan 

## 2019-10-18 NOTE — Telephone Encounter (Signed)
I called [employer] and spoke to nurse Santiago Glad she stated they did not receive the fax.. I faxed the notes again.

## 2019-10-20 NOTE — Telephone Encounter (Signed)
Madeline Brown: [id number] (exp. 10/20/19 to 11/19/19) order sent to GI. They will reach out to the patient to schedule.

## 2019-11-28 DIAGNOSIS — I770 Arteriovenous fistula, acquired: Secondary | ICD-10-CM | POA: Insufficient documentation

## 2019-11-28 DIAGNOSIS — F17200 Nicotine dependence, unspecified, uncomplicated: Secondary | ICD-10-CM | POA: Insufficient documentation

## 2019-11-28 DIAGNOSIS — Z8679 Personal history of other diseases of the circulatory system: Secondary | ICD-10-CM | POA: Insufficient documentation

## 2019-12-07 ENCOUNTER — Other Ambulatory Visit: Payer: Self-pay

## 2019-12-07 ENCOUNTER — Inpatient Hospital Stay (HOSPITAL_COMMUNITY)
Admission: EM | Admit: 2019-12-07 | Discharge: 2019-12-09 | DRG: 304 | Disposition: A | Discharge status: Home / Self Care | Payer: Medicare HMO | Attending: Internal Medicine | Admitting: Internal Medicine

## 2019-12-07 ENCOUNTER — Encounter (HOSPITAL_COMMUNITY): Payer: Self-pay | Admitting: Emergency Medicine

## 2019-12-07 ENCOUNTER — Emergency Department (HOSPITAL_COMMUNITY): Payer: Medicare HMO

## 2019-12-07 DIAGNOSIS — J9601 Acute respiratory failure with hypoxia: Secondary | ICD-10-CM | POA: Diagnosis present

## 2019-12-07 DIAGNOSIS — Z20822 Contact with and (suspected) exposure to covid-19: Secondary | ICD-10-CM | POA: Diagnosis present

## 2019-12-07 DIAGNOSIS — I1311 Hypertensive heart and chronic kidney disease without heart failure, with stage 5 chronic kidney disease, or end stage renal disease: Secondary | ICD-10-CM | POA: Diagnosis present

## 2019-12-07 DIAGNOSIS — N2581 Secondary hyperparathyroidism of renal origin: Secondary | ICD-10-CM | POA: Diagnosis present

## 2019-12-07 DIAGNOSIS — Z79899 Other long term (current) drug therapy: Secondary | ICD-10-CM

## 2019-12-07 DIAGNOSIS — E8770 Fluid overload, unspecified: Secondary | ICD-10-CM | POA: Diagnosis present

## 2019-12-07 DIAGNOSIS — R0603 Acute respiratory distress: Secondary | ICD-10-CM

## 2019-12-07 DIAGNOSIS — D649 Anemia, unspecified: Secondary | ICD-10-CM | POA: Diagnosis present

## 2019-12-07 DIAGNOSIS — N186 End stage renal disease: Secondary | ICD-10-CM | POA: Diagnosis present

## 2019-12-07 DIAGNOSIS — I161 Hypertensive emergency: Principal | ICD-10-CM | POA: Diagnosis present

## 2019-12-07 DIAGNOSIS — I160 Hypertensive urgency: Secondary | ICD-10-CM | POA: Diagnosis present

## 2019-12-07 DIAGNOSIS — J810 Acute pulmonary edema: Secondary | ICD-10-CM | POA: Diagnosis present

## 2019-12-07 DIAGNOSIS — Z992 Dependence on renal dialysis: Secondary | ICD-10-CM | POA: Diagnosis present

## 2019-12-07 DIAGNOSIS — F17210 Nicotine dependence, cigarettes, uncomplicated: Secondary | ICD-10-CM | POA: Diagnosis present

## 2019-12-07 DIAGNOSIS — Z9119 Patient's noncompliance with other medical treatment and regimen: Secondary | ICD-10-CM

## 2019-12-07 DIAGNOSIS — G40909 Epilepsy, unspecified, not intractable, without status epilepticus: Secondary | ICD-10-CM | POA: Diagnosis present

## 2019-12-07 DIAGNOSIS — Z9114 Patient's other noncompliance with medication regimen: Secondary | ICD-10-CM

## 2019-12-07 DIAGNOSIS — S0689AA Other specified intracranial injury with loss of consciousness status unknown, initial encounter: Secondary | ICD-10-CM | POA: Diagnosis present

## 2019-12-07 LAB — I-STAT CHEM 8, ED
BUN: 37 mg/dL — ABNORMAL HIGH (ref 6–20)
Calcium, Ion: 1.05 mmol/L — ABNORMAL LOW (ref 1.15–1.40)
Chloride: 100 mmol/L (ref 98–111)
Creatinine, Ser: 6.7 mg/dL — ABNORMAL HIGH (ref 0.44–1.00)
Glucose, Bld: 161 mg/dL — ABNORMAL HIGH (ref 70–99)
HCT: 35 % — ABNORMAL LOW (ref 36.0–46.0)
Hemoglobin: 11.9 g/dL — ABNORMAL LOW (ref 12.0–15.0)
Potassium: 3.7 mmol/L (ref 3.5–5.1)
Sodium: 139 mmol/L (ref 135–145)
TCO2: 25 mmol/L (ref 22–32)

## 2019-12-07 LAB — CBC WITH DIFFERENTIAL/PLATELET
Abs Immature Granulocytes: 0.02 10*3/uL (ref 0.00–0.07)
Basophils Absolute: 0 10*3/uL (ref 0.0–0.1)
Basophils Relative: 1 %
Eosinophils Absolute: 0.2 10*3/uL (ref 0.0–0.5)
Eosinophils Relative: 4 %
HCT: 35.1 % — ABNORMAL LOW (ref 36.0–46.0)
Hemoglobin: 11.3 g/dL — ABNORMAL LOW (ref 12.0–15.0)
Immature Granulocytes: 0 %
Lymphocytes Relative: 25 %
Lymphs Abs: 1.4 10*3/uL (ref 0.7–4.0)
MCH: 33.1 pg (ref 26.0–34.0)
MCHC: 32.2 g/dL (ref 30.0–36.0)
MCV: 102.9 fL — ABNORMAL HIGH (ref 80.0–100.0)
Monocytes Absolute: 0.4 10*3/uL (ref 0.1–1.0)
Monocytes Relative: 8 %
Neutro Abs: 3.4 10*3/uL (ref 1.7–7.7)
Neutrophils Relative %: 62 %
Platelets: 109 10*3/uL — ABNORMAL LOW (ref 150–400)
RBC: 3.41 MIL/uL — ABNORMAL LOW (ref 3.87–5.11)
RDW: 14.2 % (ref 11.5–15.5)
WBC: 5.4 10*3/uL (ref 4.0–10.5)
nRBC: 0.9 % — ABNORMAL HIGH (ref 0.0–0.2)

## 2019-12-07 LAB — BASIC METABOLIC PANEL
Anion gap: 16 — ABNORMAL HIGH (ref 5–15)
BUN: 33 mg/dL — ABNORMAL HIGH (ref 6–20)
CO2: 23 mmol/L (ref 22–32)
Calcium: 9.3 mg/dL (ref 8.9–10.3)
Chloride: 99 mmol/L (ref 98–111)
Creatinine, Ser: 6.23 mg/dL — ABNORMAL HIGH (ref 0.44–1.00)
GFR calc Af Amer: 9 mL/min — ABNORMAL LOW (ref >60)
GFR calc non Af Amer: 8 mL/min — ABNORMAL LOW (ref >60)
Glucose, Bld: 161 mg/dL — ABNORMAL HIGH (ref 70–99)
Potassium: 3.8 mmol/L (ref 3.5–5.1)
Sodium: 138 mmol/L (ref 135–145)

## 2019-12-07 LAB — SARS CORONAVIRUS 2 BY RT PCR (HOSPITAL ORDER, PERFORMED IN ~~LOC~~ HOSPITAL LAB): SARS Coronavirus 2: NEGATIVE

## 2019-12-07 LAB — MAGNESIUM: Magnesium: 2.5 mg/dL — ABNORMAL HIGH (ref 1.7–2.4)

## 2019-12-07 LAB — PHOSPHORUS: Phosphorus: 3 mg/dL (ref 2.5–4.6)

## 2019-12-07 MED ORDER — CARVEDILOL 25 MG PO TABS
25.0000 mg | ORAL_TABLET | Freq: Two times a day (BID) | ORAL | Status: DC
  Administered 2019-12-08 – 2019-12-09 (×3): 25 mg via ORAL
  Filled 2019-12-07: qty 1
  Filled 2019-12-07 (×2): qty 2

## 2019-12-07 MED ORDER — SPIRONOLACTONE 25 MG PO TABS
50.0000 mg | ORAL_TABLET | Freq: Every day | ORAL | Status: DC
  Administered 2019-12-08 – 2019-12-09 (×2): 50 mg via ORAL
  Filled 2019-12-07 (×3): qty 2

## 2019-12-07 MED ORDER — CHLORHEXIDINE GLUCONATE CLOTH 2 % EX PADS: 6.0000 | MEDICATED_PAD | Freq: Every day | CUTANEOUS | Status: DC

## 2019-12-07 MED ORDER — LEVETIRACETAM ER 1000 MG PO TB24: 1000.0000 mg | ORAL_TABLET | Freq: Every day | ORAL | Status: DC

## 2019-12-07 MED ORDER — IOHEXOL 350 MG/ML SOLN
100.0000 mL | Freq: Once | INTRAVENOUS | Status: AC | PRN
  Administered 2019-12-07: 100 mL via INTRAVENOUS

## 2019-12-07 MED ORDER — ATORVASTATIN CALCIUM 10 MG PO TABS
20.0000 mg | ORAL_TABLET | Freq: Every day | ORAL | Status: DC
  Administered 2019-12-08: 20 mg via ORAL
  Filled 2019-12-07: qty 2

## 2019-12-07 MED ORDER — RENA-VITE PO TABS
1.0000 | ORAL_TABLET | Freq: Every day | ORAL | Status: DC
  Administered 2019-12-08 (×2): 1 via ORAL
  Filled 2019-12-07 (×2): qty 1

## 2019-12-07 MED ORDER — CINACALCET HCL 30 MG PO TABS
30.0000 mg | ORAL_TABLET | ORAL | Status: DC
  Filled 2019-12-07 (×2): qty 1

## 2019-12-07 MED ORDER — CALCITRIOL 0.25 MCG PO CAPS
1.2500 ug | ORAL_CAPSULE | ORAL | Status: DC
  Administered 2019-12-08: 1.25 ug via ORAL
  Filled 2019-12-07: qty 1

## 2019-12-07 MED ORDER — LEVETIRACETAM 500 MG PO TABS
500.0000 mg | ORAL_TABLET | Freq: Two times a day (BID) | ORAL | Status: DC
  Administered 2019-12-08 – 2019-12-09 (×2): 500 mg via ORAL
  Filled 2019-12-07 (×2): qty 1

## 2019-12-07 MED ORDER — HYDRALAZINE HCL 20 MG/ML IJ SOLN
10.0000 mg | Freq: Once | INTRAMUSCULAR | Status: AC
  Administered 2019-12-07: 10 mg via INTRAVENOUS
  Filled 2019-12-07: qty 1

## 2019-12-07 MED ORDER — CHLORHEXIDINE GLUCONATE CLOTH 2 % EX PADS
6.0000 | MEDICATED_PAD | Freq: Every day | CUTANEOUS | Status: DC
  Administered 2019-12-09: 6 via TOPICAL

## 2019-12-07 MED ORDER — HYDRALAZINE HCL 50 MG PO TABS
100.0000 mg | ORAL_TABLET | Freq: Two times a day (BID) | ORAL | Status: DC
  Administered 2019-12-08 (×2): 100 mg via ORAL
  Filled 2019-12-07 (×2): qty 2

## 2019-12-07 MED ORDER — AMLODIPINE BESYLATE 10 MG PO TABS
10.0000 mg | ORAL_TABLET | Freq: Every day | ORAL | Status: DC
  Administered 2019-12-08 (×2): 10 mg via ORAL
  Filled 2019-12-07 (×2): qty 2

## 2019-12-07 MED ORDER — SEVELAMER CARBONATE 800 MG PO TABS
1600.0000 mg | ORAL_TABLET | Freq: Three times a day (TID) | ORAL | Status: DC
  Administered 2019-12-08: 1600 mg via ORAL
  Filled 2019-12-07 (×4): qty 2

## 2019-12-07 NOTE — ED Provider Notes (Addendum)
[facility] EMERGENCY DEPARTMENT Provider Note   CSN: [id number] Arrival date & time: 12/07/19  1649     History Chief Complaint  Patient presents with  . Shortness of Breath    Madeline Brown is a 44 y.o. female.  HPI    Level 5 caveat for respiratory distress.  44 year old female with history of ESRD on TTS HD, hypertension comes in w/ chief complaint of shortness of breath.  Patient reports that about an hour ago she started getting shortness of breath that has progressed rapidly.  Patient has no chest pain.  She denies any fevers, lung disease, new cough.  Patient states that she is not up-to-date with her hemodialysis.  She denies drug use.  She is up-to-date with her COVID-19 shots.  Past Medical History:  Diagnosis Date  . Anemia   . Chronic kidney disease   . Hypertension     Patient Active Problem List   Diagnosis Date Noted  . Intracerebral hemorrhage 02/21/2019  . Intracranial hematoma ([place]) 02/20/2019  . Seizure ([place]) 02/19/2019  . ESRD (end stage renal disease) ([place]) 06/24/2017  . Hypertension 03/23/2017  . Anemia 02/12/2017    Past Surgical History:  Procedure Laterality Date  . AV FISTULA PLACEMENT Left 06/24/2017   Procedure: CREATION OF LEFT ARM BRACHIALCEPHALIC  ARTERIOVENOUS (AV) FISTULA;  Surgeon: Rosetta Posner, MD;  Location: [facility];  Service: Vascular;  Laterality: Left;  . CESAREAN SECTION     x2  . FISTULA SUPERFICIALIZATION Left 10/28/2017   Procedure: SUPERFICIALIZATION LEFT BRACHIOCEPHALIC ARTERIOVENOUS FISTULA;  Surgeon: Rosetta Posner, MD;  Location: [facility];  Service: Vascular;  Laterality: Left;  . INSERTION OF DIALYSIS CATHETER Right 06/24/2017   Procedure: INSERTION OF TUNNELED DIALYSIS CATHETER;  Surgeon: Rosetta Posner, MD;  Location: [facility];  Service: Vascular;  Laterality: Right;  . LIGATION OF COMPETING BRANCHES OF ARTERIOVENOUS FISTULA  10/28/2017   Procedure: LIGATION OF COMPETING BRANCHES OF ARTERIOVENOUS FISTULA x3;   Surgeon: Rosetta Posner, MD;  Location: [facility];  Service: Vascular;;     OB History    Gravida  8   Para  5   Term  5   Preterm      AB  2   Living  5     SAB      TAB  2   Ectopic      Multiple      Live Births  5           Family History  Problem Relation Age of Onset  . Diabetes Father   . Hypertension Father   . Cancer Father   . Heart disease Father   . Aneurysm Mother   . Seizures Mother     Social History   Tobacco Use  . Smoking status: Current Every Day Smoker    Packs/day: 0.25    Years: 15.00    Pack years: 3.75    Types: Cigarettes  . Smokeless tobacco: Never Used  . Tobacco comment: 4 cigarettes per day  Vaping Use  . Vaping Use: Never used  Substance Use Topics  . Alcohol use: Yes    Comment: occassional beer  . Drug use: Yes    Types: Marijuana    Home Medications Prior to Admission medications   Medication Sig Start Date End Date Taking? Authorizing Provider  amLODipine (NORVASC) 10 MG tablet Take 10 mg by mouth at bedtime.   Yes [provider]  atorvastatin (LIPITOR) 20 MG tablet  Take 1 tablet (20 mg total) by mouth daily at 6 PM. 03/23/19  Yes McCue, Janett Billow, NP  B Complex-C-Folic Acid (DIALYVITE TABLET) TABS Take 1 tablet by mouth daily. 09/27/19  Yes [provider]  carvedilol (COREG) 25 MG tablet Take 25 mg by mouth 2 (two) times daily with a meal.   Yes [provider]  hydrALAZINE (APRESOLINE) 100 MG tablet Take 100 mg by mouth 2 (two) times daily.   Yes [provider]  levETIRAcetam 1000 MG TB24 Take 1,000 mg by mouth daily. 10/10/19  Yes McCue, Janett Billow, NP  lidocaine-prilocaine (EMLA) cream Apply 1 application topically See admin instructions. Apply small amount to access site one hour before dialysis (Tuesday, Thursday, Saturday). Cover with occlusive dressing (Saran wrap) 10/19/18  Yes [provider]  multivitamin (RENA-VIT) TABS tablet Take 1 tablet by mouth at  bedtime. Patient taking differently: Take 1 tablet by mouth daily.  07/13/18  Yes Bonnell Public, MD  sevelamer carbonate (RENVELA) 800 MG tablet Take 2 tablets (1,600 mg total) by mouth 3 (three) times daily with meals. 02/21/19  Yes Madalyn Rob, MD  spironolactone (ALDACTONE) 50 MG tablet Take 50 mg by mouth daily.   Yes [provider]  carvedilol (COREG) 25 MG tablet Take 1 tablet (25 mg total) by mouth 2 (two) times daily with a meal. 06/27/17 10/23/19  Shelly Coss, MD    Allergies    Visine [tetrahydrozoline hcl]  Review of Systems   Review of Systems  Unable to perform ROS: Severe respiratory distress  Constitutional: Positive for activity change.  Respiratory: Positive for shortness of breath. Negative for cough.   Cardiovascular: Negative for chest pain.  Gastrointestinal: Negative for nausea and vomiting.    Physical Exam Updated Vital Signs BP (!) 178/123   Pulse (!) 118   Temp 97.6 F (36.4 C) (Axillary)   Resp (!) 26   SpO2 99%   Physical Exam Vitals and nursing note reviewed.  Constitutional:      Appearance: She is well-developed.  HENT:     Head: Atraumatic.  Cardiovascular:     Rate and Rhythm: Tachycardia present.  Pulmonary:     Effort: Tachypnea, accessory muscle usage and respiratory distress present.     Breath sounds: Examination of the right-lower field reveals rales. Examination of the left-lower field reveals rales. Decreased breath sounds and rales present.  Abdominal:     General: Bowel sounds are normal.  Musculoskeletal:     Right lower leg: No edema.     Left lower leg: No edema.  Skin:    General: Skin is warm and dry.  Neurological:     Mental Status: She is alert and oriented to person, place, and time.     ED Results / Procedures / Treatments   Labs (all labs ordered are listed, but only abnormal results are displayed) Labs Reviewed  CBC WITH DIFFERENTIAL/PLATELET - Abnormal; Notable for the following components:       Result Value   RBC 3.41 (*)    Hemoglobin 11.3 (*)    HCT 35.1 (*)    MCV 102.9 (*)    Platelets 109 (*)    nRBC 0.9 (*)    All other components within normal limits  BASIC METABOLIC PANEL - Abnormal; Notable for the following components:   Glucose, Bld 161 (*)    BUN 33 (*)    Creatinine, Ser 6.23 (*)    GFR calc non Af Amer 8 (*)    GFR  calc Af Amer 9 (*)    Anion gap 16 (*)    All other components within normal limits  MAGNESIUM - Abnormal; Notable for the following components:   Magnesium 2.5 (*)    All other components within normal limits  I-STAT CHEM 8, ED - Abnormal; Notable for the following components:   BUN 37 (*)    Creatinine, Ser 6.70 (*)    Glucose, Bld 161 (*)    Calcium, Ion 1.05 (*)    Hemoglobin 11.9 (*)    HCT 35.0 (*)    All other components within normal limits  SARS CORONAVIRUS 2 BY RT PCR (HOSPITAL ORDER, [facility])  PHOSPHORUS    EKG EKG Interpretation  Date/Time:  Wednesday December 07 2019 16:54:32 EDT Ventricular Rate:  149 PR Interval:    QRS Duration: 78 QT Interval:  326 QTC Calculation: 514 R Axis:   42 Text Interpretation: Sinus tachycardia vs aflutter LAE, consider biatrial enlargement Anteroseptal infarct, age indeterminate ST elevation, consider inferior injury Prolonged QT interval No acute changes Confirmed by Varney Biles [phone]) on 12/07/2019 6:06:18 PM   EKG Interpretation  Date/Time:  Wednesday December 07 2019 17:36:05 EDT Ventricular Rate:  117 PR Interval:    QRS Duration: 85 QT Interval:  326 QTC Calculation: 455 R Axis:   31 Text Interpretation: Sinus tachycardia Probable left atrial enlargement LVH with secondary repolarization abnormality No acute changes Nonspecific ST and T wave abnormality Confirmed by Varney Biles [phone]) on 12/07/2019 6:12:10 PM         Radiology DG Chest Portable 1 View  Result Date: 12/07/2019 CLINICAL DATA:  44 year old female with shortness of breath.  EXAM: PORTABLE CHEST 1 VIEW COMPARISON:  Chest radiograph dated 07/10/2018 and CT dated 07/11/2018 FINDINGS: There is cardiomegaly. Diffuse interstitial prominence and hazy airspace densities most concerning for edema. Patchy area of airspace opacity at the right lung base may represent edema but pneumonia is not excluded. Clinical correlation is recommended. Probable trace bilateral pleural effusions. No pneumothorax. No acute osseous pathology. IMPRESSION: Cardiomegaly with findings of CHF. Pneumonia is not excluded. Electronically Signed   By: Anner Crete M.D.   On: 12/07/2019 17:22   CT Angio Chest/Abd/Pel for Dissection W and/or W/WO  Result Date: 12/07/2019 CLINICAL DATA:  44 year old female with shortness of breath. Concern for aortic dissection. EXAM: CT ANGIOGRAPHY CHEST, ABDOMEN AND PELVIS TECHNIQUE: Non-contrast CT of the chest was initially obtained. Multidetector CT imaging through the chest, abdomen and pelvis was performed using the standard protocol during bolus administration of intravenous contrast. Multiplanar reconstructed images and MIPs were obtained and reviewed to evaluate the vascular anatomy. CONTRAST:  179mL OMNIPAQUE IOHEXOL 350 MG/ML SOLN COMPARISON:  Chest radiograph dated 12/07/2019 and CT dated 07/11/2018. FINDINGS: CTA CHEST FINDINGS Cardiovascular: There is mild cardiomegaly, improved since the prior CT. Small pericardial effusion measures approximately 9 mm in thickness anterior to the heart. The thoracic aorta is unremarkable. The origins of the great vessels of the aortic arch appear patent. Mild dilatation of the main pulmonary trunk suggestive of a degree of pulmonary hypertension. Clinical correlation is recommended. No CT evidence of pulmonary embolism. Mediastinum/Nodes: No hilar adenopathy. Top-normal mediastinal lymph nodes. The esophagus and the thyroid gland are grossly unremarkable. No mediastinal fluid collection. Lungs/Pleura: Diffuse bilateral ground-glass  and confluent hazy airspace opacities may represent edema or pneumonia. Clinical correlation and follow-up to resolution recommended. Left lung base linear atelectasis/scarring. No lobar consolidation, pleural effusion, or pneumothorax. The central airways are patent.  Musculoskeletal: No acute osseous pathology. Review of the MIP images confirms the above findings. CTA ABDOMEN AND PELVIS FINDINGS VASCULAR Aorta: Mild atherosclerotic calcification. No aneurysmal dilatation or dissection. Celiac: Patent without evidence of aneurysm, dissection, vasculitis or significant stenosis. SMA: Patent without evidence of aneurysm, dissection, vasculitis or significant stenosis. Renals: Both renal arteries are patent without evidence of aneurysm, dissection, vasculitis, fibromuscular dysplasia or significant stenosis. IMA: Patent without evidence of aneurysm, dissection, vasculitis or significant stenosis. Inflow: Mild atherosclerotic calcification. No aneurysmal dilatation or dissection. The iliac arteries are patent. Veins: No obvious venous abnormality within the limitations of this arterial phase study. Review of the MIP images confirms the above findings. NON-VASCULAR No intra-abdominal free air. Trace free fluid in the pelvis. Hepatobiliary: No focal liver abnormality is seen. No gallstones, gallbladder wall thickening, or biliary dilatation. Pancreas: Unremarkable. No pancreatic ductal dilatation or surrounding inflammatory changes. Spleen: Normal in size without focal abnormality. Adrenals/Urinary Tract: The adrenal glands are unremarkable. Mild bilateral renal atrophy. No hydronephrosis on either side. There is a 5 mm nonobstructing right renal inferior pole calculus. The visualized ureters and urinary bladder appear unremarkable. Stomach/Bowel: There is colonic diverticulosis without active inflammatory changes. There is no bowel obstruction or active inflammation. The appendix is normal. Lymphatic: No adenopathy.  Reproductive: Enlarged uterus, possibly related to fibroid. This can be better evaluated with ultrasound on nonemergent/outpatient basis. The ovaries are grossly unremarkable. Mild prominence of the gonadal vein right greater than left. Other: None Musculoskeletal: No acute osseous pathology. Indeterminate 2 cm lucent lesion with sclerotic margins in the proximal right femoral diaphysis does not demonstrate aggressive features and appears similar to the study of 2019. Review of the MIP images confirms the above findings. IMPRESSION: 1. No aortic dissection or aneurysm. No CT evidence of pulmonary embolism. 2. Mild cardiomegaly and small pericardial effusion, significantly improved since the prior CT. 3. Diffuse bilateral ground-glass and confluent hazy airspace opacities may represent edema or pneumonia. Clinical correlation and follow-up to resolution recommended. 4. Colonic diverticulosis. No bowel obstruction. Normal appendix. 5. Enlarged uterus, possibly related to fibroid. This can be better evaluated with ultrasound on nonemergent/outpatient basis. 6. A 5 mm nonobstructing right renal inferior pole calculus. No hydronephrosis. 7. Aortic Atherosclerosis (ICD10-I70.0). Electronically Signed   By: Anner Crete M.D.   On: 12/07/2019 20:24    Procedures .Critical Care Performed by: Varney Biles, MD Authorized by: Varney Biles, MD   Critical care provider statement:    Critical care time (minutes):  52   Critical care was necessary to treat or prevent imminent or life-threatening deterioration of the following conditions:  Circulatory failure and respiratory failure   Critical care was time spent personally by me on the following activities:  Discussions with consultants, evaluation of patient's response to treatment, examination of patient, ordering and performing treatments and interventions, ordering and review of laboratory studies, ordering and review of radiographic studies, pulse  oximetry, re-evaluation of patient's condition, obtaining history from patient or surrogate and review of old charts   (including critical care time)  Medications Ordered in ED Medications  Chlorhexidine Gluconate Cloth 2 % PADS 6 each (has no administration in time range)  Chlorhexidine Gluconate Cloth 2 % PADS 6 each (has no administration in time range)  cinacalcet (SENSIPAR) tablet 30 mg (has no administration in time range)  calcitRIOL (ROCALTROL) capsule 1.25 mcg (has no administration in time range)  hydrALAZINE (APRESOLINE) injection 10 mg (10 mg Intravenous Given 12/07/19 1819)  iohexol (OMNIPAQUE) 350 MG/ML injection 100 mL (100  mLs Intravenous Contrast Given 12/07/19 1939)    ED Course  I have reviewed the triage vital signs and the nursing notes.  Pertinent labs & imaging results that were available during my care of the patient were reviewed by me and considered in my medical decision making (see chart for details).  Clinical Course as of Dec 06 2028  Wed Dec 07, 2019  1812 Patient's heart rate has improved spontaneously.  She is now noted to have heart rate in the 110-120.  Sinus.  We will give her IV hydralazine.  Nephrology has been consulted as chest x-ray showing signs of congestive heart failure/pulmonary edema.   [AN]  2029 I discussed case with Dr. Justin Mend, nephrology.  They will dialyze the patient.  He did request that we get a CT angiogram dissection to rule it out given the tachycardia and hypertension.  CT scan results have come back and they look reassuring.   [AN]    Clinical Course User Index [AN] Varney Biles, MD   MDM Rules/Calculators/A&P                          44 year old female with history of ESRD on HD, hypertension comes in a chief complaint of respiratory distress.  She had sudden onset of worsening shortness of breath.  Patient is tripoding.  She has heart rate in the 150s and hypertensive.  With diminished breath sounds diffusely, clinical  concerns are for flash pulmonary edema.  Pneumothorax, multifocal pneumonia also considered.  Appropriate imaging initiated.  Patient placed on BiPAP. Unsure what the underlying heart rhythm is.  Appears to be sinus but could also be a flutter.  We will allow for patient to settle in and reassess decide if we need to consider adenosine.    Final Clinical Impression(s) / ED Diagnoses Final diagnoses:  Flash pulmonary edema ([place])  Acute respiratory distress    Rx / DC Orders ED Discharge Orders    None       Varney Biles, MD 12/07/19 1813    Varney Biles, MD 12/07/19 2030

## 2019-12-07 NOTE — ED Notes (Signed)
PT Transported to Dialysis, Report given to dialysis RN

## 2019-12-07 NOTE — H&P (Signed)
Date: 12/07/2019               Patient Name:  Madeline Brown MRN: [id number]  DOB: [birth year] Age / Sex: 44 y.o., female   PCP: Patient, No Pcp Per         Medical Service: Internal Medicine Teaching Service         Attending Physician: Dr. Lucious Groves, DO    First Contact: Dr. Marva Panda Pager: [phone]  Second Contact: Dr. 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 She had a similar episode on Friday that lasted approximately 2.5-3 hours before spontaneously resolving. She had another episode while walking around [place] on Monday that lasted about an hour before resolving. Prior to 5 days ago, she has not had any similar symptoms. Endorses associated lightheadedness, nausea, and palpitations. Denies chest pain or syncope during time of events.  She has noticed her blood pressure has been steadily increasing at HD the last week or so. She has not been able to take her anti-hypertensive regimen as prescribed due to not being able to get refills on time.  Patient has been on HD since 2019 after longstanding uncontrolled HTN. She admittedly misses approximately 2 sessions per month, but has not missed any over the last week.  She denies headaches, vision changes, nasal congestion, sore throat, abdominal pain, n/v/d, numbness/tingling or weakness.   Meds:  Current Meds  Medication Sig  . amLODipine (NORVASC) 10 MG tablet Take 10 mg by mouth at bedtime.  Marland Kitchen atorvastatin (LIPITOR) 20 MG tablet Take 1 tablet (20 mg total) by mouth daily at 6 PM.  . B Complex-C-Folic Acid (DIALYVITE TABLET) TABS Take 1 tablet by mouth daily.  .  carvedilol (COREG) 25 MG tablet Take 25 mg by mouth 2 (two) times daily with a meal.  . hydrALAZINE (APRESOLINE) 100 MG tablet Take 100 mg by mouth 2 (two) times daily.  Marland Kitchen levETIRAcetam 1000 MG TB24 Take 1,000 mg by mouth daily.  Marland Kitchen lidocaine-prilocaine (EMLA) cream Apply 1 application topically See admin instructions. Apply small amount to access site one hour before dialysis (Tuesday, Thursday, Saturday). Cover with occlusive dressing (Saran wrap)  . multivitamin (RENA-VIT) TABS tablet Take 1 tablet by mouth at bedtime. (Patient taking differently: Take 1 tablet by mouth daily. )  . sevelamer carbonate (RENVELA) 800 MG tablet Take 2 tablets (1,600 mg total) by mouth 3 (three) times daily with meals.  Marland Kitchen spironolactone (ALDACTONE) 50 MG tablet Take 50 mg by mouth daily.     Allergies: Allergies as of 12/07/2019 - Review Complete 12/07/2019  Allergen Reaction Noted  . Visine [tetrahydrozoline hcl] Swelling 07/12/2015   Past Medical History:  Diagnosis Date  . Anemia   . Chronic kidney disease   . Hypertension     Family History: Father has HTN and DM, mother died from ruptured aneurysm   Social History: smokes 3-4 cigarettes per day, no EtOH use, endorses weekly marijuana use primarily to stimulate appetite.   Review of Systems: A complete ROS was negative except as per HPI.   Physical Exam: Blood pressure Marland Kitchen)  196/117, pulse (!) 102, temperature 97.6 F (36.4 C), temperature source Axillary, resp. rate (!) 24, SpO2 100 %, unknown if currently breastfeeding. General: awake, alert, breathing and talking comfortably on BiPAP HEENT: [place]/AT; conjunctiva normal Neck: +JVD CV: tachycardic, S4 gallop, SEM  Pulm: normal work of breathing, able to converse normally, bibasilar rales Abd: BS+; abdomen soft, non-tender, non-distended Ext: no edema Neuro: A&Ox4; no focal deficits Psych: pleasant mood and affect   EKG: personally reviewed my interpretation is sinus tach, no acute ischemic  changes, evidence of LVH  CXR: personally reviewed my interpretation is diffuse interstitial opacities    Assessment & Plan by Problem: Active Problems:   Hypertensive urgency  In summary, Madeline Brown is a 44 y/o female with ESRD on HD T/Th/S, HTN, ICH with related seizures who presents with acute onset shortness of breath. On arrival she was found to be hypertensive, tachycardic, and in acute respiratory distress exhibiting tripoding and only able to talk in 1-2 word phrases. She was placed on BiPAP and had significantly improved by our evaluation. Labs were at baseline for end-stage renal disease. Chest x-ray showed evidence of pulmonary edema. A CTA of chest, abdomen and pelvis was obtained to rule out aortic dissection which was negative. She as admitted for further management.   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 Will obtain while inpatient   DVT ppx: SCDs Diet: HH/Renal CODE: FULL  Dispo: Admit patient to Observation with expected length of stay less than 2 midnights.  SignedDelice Bison, DO 12/07/2019, 9:26 PM  Pager: [phone] After 5pm on weekdays and 1pm on weekends: On Call pager: [phone]

## 2019-12-07 NOTE — Consult Note (Signed)
Referring Provider: No ref. provider found Primary Care Physician:  Patient, No Pcp Per Primary Nephrologist:  Dr. Justin Mend  Reason for Consultation: Medical management end-stage renal disease, maintenance of euvolemia, treatment of anemia, treatment of secondary hyperparathyroidism.  HPI: This a 44 year old lady who was seen in the emergency department 12/07/2019.  She is end-stage renal disease receives with dialysis Tuesday Thursday Saturday at [facility].  She states she has been running relatively high blood pressures during her evaluation prior to dialysis.  And they have been challenging her dry weight.  Her dry weight is set at 58 kg on the last treatment on 12/06/2019 she managed to be 56 kg is almost 2 kg below her estimated dry weight.  She does have a history of noncompliance and is missed 3 dialysis treatments in the last 30 days.  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  Last saw her neurologist 10/10/2019 recommendations were to repeat MRI.  This apparently has not been performed.  She did have an MRI performed in 02/20/2019 at the time of intracerebral hemorrhage 20 x 13 mm left parietal mass with subacute hemorrhage and mild vasogenic edema.  Recommendations were for follow-up MRI to evaluate underlying source of hemorrhage.  This has not been performed  She states she has been out of blood pressure medications that include hydralazine and carvedilol.  Her other medications include Keppra 1 tablet daily, atorvastatin 20 mg daily, amlodipine 10 mg daily, and hydralazine 100 mg 3 times daily, Renvela 2 tablets 3 times daily 1 twice daily with snacks, Dialyvite 1 daily, spironolactone 50 mg daily, carvedilol 25 mg twice daily.  Her dialysis prescription is 4 hours blood flow rate 350 dialysis rate for 600 cc estimated dry weight 58 kg potassium 2 calcium 2 sodium 137 bicarbonate 35  Left upper arm AV fistula with flows greater than 2000  12/03/2019  Last hemoglobin 10.1  Micera 75 mcg every 4 weeks Venofer 50 mg once weekly   Phosphorus 4.2 calcium 9.4 PTH 398 albumin 4.1 potassium 4.5.  These are labs from 12/01/2019  Blood pressure 176/111 pulse 107 temperature 97.6 O2 sats 100% on BiPAP  Sodium 138 potassium 3.8 chloride 99 CO2 23 BUN 33 creatinine 6.23 glucose 161 calcium 9.3 phosphorus 3.0 magnesium 2.5 hemoglobin 11.3  Chest x-ray 12/07/2019 1722   cardiomegaly with CHF pneumonia not excluded  Past Medical History:  Diagnosis Date  . Anemia   . Chronic kidney disease   . Hypertension     Past Surgical History:  Procedure Laterality Date  . AV FISTULA PLACEMENT Left 06/24/2017   Procedure: CREATION OF LEFT ARM BRACHIALCEPHALIC  ARTERIOVENOUS (AV) FISTULA;  Surgeon: Rosetta Posner, MD;  Location: [place];  Service: Vascular;  Laterality: Left;  . CESAREAN SECTION     x2  . FISTULA SUPERFICIALIZATION Left 10/28/2017   Procedure: SUPERFICIALIZATION LEFT BRACHIOCEPHALIC ARTERIOVENOUS FISTULA;  Surgeon: Rosetta Posner, MD;  Location: [place];  Service: Vascular;  Laterality: Left;  . INSERTION OF DIALYSIS CATHETER Right 06/24/2017   Procedure: INSERTION OF TUNNELED DIALYSIS CATHETER;  Surgeon: Rosetta Posner, MD;  Location: [place];  Service: Vascular;  Laterality: Right;  . LIGATION OF COMPETING BRANCHES OF ARTERIOVENOUS FISTULA  10/28/2017   Procedure: LIGATION OF COMPETING BRANCHES OF ARTERIOVENOUS FISTULA x3;  Surgeon: Rosetta Posner, MD;  Location: [place];  Service: Vascular;;    Prior to Admission medications   Medication Sig Start Date End Date Taking? Authorizing Provider  amLODipine (NORVASC) 10 MG tablet Take 10 mg by mouth at bedtime.    [provider]  atorvastatin (LIPITOR) 20 MG tablet Take 1 tablet (20 mg total) by mouth daily at 6 PM. 03/23/19   Frann Rider, NP  carvedilol (COREG) 25 MG tablet Take 1 tablet (25 mg total) by mouth 2 (two) times daily with a meal. 06/27/17 10/23/19  Shelly Coss, MD   hydrALAZINE (APRESOLINE) 100 MG tablet Take 100 mg by mouth 2 (two) times daily.    [provider]  levETIRAcetam 1000 MG TB24 Take 1,000 mg by mouth daily. 10/10/19   Frann Rider, NP  lidocaine-prilocaine (EMLA) cream Apply 1 application topically See admin instructions. Apply small amount to access site one hour before dialysis (Tuesday, Thursday, Saturday). Cover with occlusive dressing (Saran wrap) 10/19/18   [provider]  multivitamin (RENA-VIT) TABS tablet Take 1 tablet by mouth at bedtime. 07/13/18   Bonnell Public, MD  sevelamer carbonate (RENVELA) 800 MG tablet Take 2 tablets (1,600 mg total) by mouth 3 (three) times daily with meals. 02/21/19   Madalyn Rob, MD  spironolactone (ALDACTONE) 50 MG tablet Take 50 mg by mouth daily.    [provider]    Current Facility-Administered Medications  Medication Dose Route Frequency Provider Last Rate Last Admin  . [START ON 12/08/2019] Chlorhexidine Gluconate Cloth 2 % PADS 6 each  6 each Topical Q0600 Edrick Oh, MD      . Derrill Memo ON 12/08/2019] Chlorhexidine Gluconate Cloth 2 % PADS 6 each  6 each Topical Q0600 Edrick Oh, MD       Current Outpatient Medications  Medication Sig Dispense Refill  . amLODipine (NORVASC) 10 MG tablet Take 10 mg by mouth at bedtime.    Marland Kitchen atorvastatin (LIPITOR) 20 MG tablet Take 1 tablet (20 mg total) by mouth daily at 6 PM. 30 tablet 3  . carvedilol (COREG) 25 MG tablet Take 1 tablet (25 mg total) by mouth 2 (two) times daily with a meal. 60 tablet 0  . hydrALAZINE (APRESOLINE) 100 MG tablet Take 100 mg by mouth 2 (two) times daily.    Marland Kitchen levETIRAcetam 1000 MG TB24 Take 1,000 mg by mouth daily. 90 tablet 3  . lidocaine-prilocaine (EMLA) cream Apply 1 application topically See admin instructions. Apply small amount to access site one hour before dialysis (Tuesday, Thursday, Saturday). Cover with occlusive dressing (Saran wrap)    . multivitamin (RENA-VIT) TABS tablet Take 1 tablet  by mouth at bedtime. 30 tablet 0  . sevelamer carbonate (RENVELA) 800 MG tablet Take 2 tablets (1,600 mg total) by mouth 3 (three) times daily with meals. 30 tablet 0  . spironolactone (ALDACTONE) 50 MG tablet Take 50 mg by mouth daily.      Allergies as of 12/07/2019 - Review Complete 12/07/2019  Allergen Reaction Noted  . Visine [tetrahydrozoline hcl] Swelling 07/12/2015    Family History  Problem Relation Age of Onset  . Diabetes Father   . Hypertension Father   . Cancer Father   . Heart disease Father   . Aneurysm Mother   . Seizures Mother     Social History   Socioeconomic History  . Marital status: Single    Spouse name: Not on file  . Number of children: Not on file  . Years of education: Not on file  . Highest education level: Not on file  Occupational History  . Not on file  Tobacco Use  . Smoking status: Current Every Day Smoker  Packs/day: 0.25    Years: 15.00    Pack years: 3.75    Types: Cigarettes  . Smokeless tobacco: Never Used  . Tobacco comment: 4 cigarettes per day  Vaping Use  . Vaping Use: Never used  Substance and Sexual Activity  . Alcohol use: Yes    Comment: occassional beer  . Drug use: Yes    Types: Marijuana  . Sexual activity: Yes    Birth control/protection: None  Other Topics Concern  . Not on file  Social History Narrative  . Not on file   Social Determinants of Health   Financial Resource Strain:   . Difficulty of Paying Living Expenses:   Food Insecurity:   . Worried About Charity fundraiser in the Last Year:   . Arboriculturist in the Last Year:   Transportation Needs:   . Film/video editor (Medical):   Marland Kitchen Lack of Transportation (Non-Medical):   Physical Activity:   . Days of Exercise per Week:   . Minutes of Exercise per Session:   Stress:   . Feeling of Stress :   Social Connections:   . Frequency of Communication with Friends and Family:   . Frequency of Social Gatherings with Friends and Family:   .  Attends Religious Services:   . Active Member of Clubs or Organizations:   . Attends Archivist Meetings:   Marland Kitchen Marital Status:   Intimate Partner Violence:   . Fear of Current or Ex-Partner:   . Emotionally Abused:   Marland Kitchen Physically Abused:   . Sexually Abused:     Review of Systems: Gen: Denies any fever, chills, sweats, anorexia, fatigue, weakness, malaise, weight loss, and sleep disorder HEENT: No visual complaints, No history of Retinopathy. Normal external appearance No Epistaxis or Sore throat. No sinusitis.   CV: She has been denying chest pain but has been dyspneic to conversation with some facial swelling and requiring BiPAP Resp: Short of breath with no cough sputum fevers pleuritic chest pain GI: Denies vomiting blood, jaundice, and fecal incontinence.   Denies dysphagia or odynophagia. GU : End-stage renal disease hemodialysis dependent Tuesday Thursday Saturday MS: Denies joint pain, limitation of movement, and swelling, stiffness, low back pain, extremity pain. Denies muscle weakness, cramps, atrophy.  No use of non steroidal antiinflammatory drugs. Derm: Denies rash, itching, dry skin, hives, moles, warts, or unhealing ulcers.  Psych: Denies depression, anxiety, memory loss, suicidal ideation, hallucinations, paranoia, and confusion. Heme: Denies bruising, bleeding, and enlarged lymph nodes. Neuro: She does have a history of seizure disorder but no history of stroke Endocrine no diabetes or thyroid disease noted.  Physical Exam: Vital signs in last 24 hours: Temp:  [97.6 F (36.4 C)] 97.6 F (36.4 C) (06/23 1656) Pulse Rate:  [118-152] 118 (06/23 1745) Resp:  [26-33] 26 (06/23 1745) BP: (172-178)/(121-123) 178/123 (06/23 1745) SpO2:  [97 %-99 %] 99 % (06/23 1745)   General:   Alert,  Well-developed, well-nourished, pleasant and cooperative in NAD Head:  Normocephalic and atraumatic. Eyes:  Sclera clear, no icterus.   Conjunctiva pink. Ears:  Normal auditory  acuity. Nose:  No deformity, discharge,  or lesions. Mouth: Using BiPAP for supplemental support Neck:  Supple; no masses or thyromegaly.  JVP elevated to the jaw  Lungs: Diminished breath sounds with rhonchi and rales Heart: Tachycardic with sustained apex beat 3-6 systolic murmur. Abdomen:  Soft, nontender and nondistended. No masses, hepatosplenomegaly or hernias noted. Normal bowel sounds, without guarding, and without  rebound.   Msk:  Symmetrical without gross deformities. Normal posture. Pulses:  No carotid, renal, femoral bruits. DP and PT symmetrical and equal Extremities:  Without clubbing or edema. Neurologic:  Alert and  oriented x4;  grossly normal neurologically. Skin:  Intact without significant lesions or rashes. Cervical Nodes:  No significant cervical adenopathy. Psych:  Alert and cooperative. Normal mood and affect.  Intake/Output from previous day: No intake/output data recorded. Intake/Output this shift: No intake/output data recorded.  Lab Results: Recent Labs    12/07/19 1706 12/07/19 1743  WBC  --  5.4  HGB 11.9* 11.3*  HCT 35.0* 35.1*  PLT  --  109*   BMET Recent Labs    12/07/19 1706 12/07/19 1743  NA 139 138  K 3.7 3.8  CL 100 99  CO2  --  23  GLUCOSE 161* 161*  BUN 37* 33*  CREATININE 6.70* 6.23*  CALCIUM  --  9.3  PHOS  --  3.0   LFT No results for input(s): PROT, ALBUMIN, AST, ALT, ALKPHOS, BILITOT, BILIDIR, IBILI in the last 72 hours. PT/INR No results for input(s): LABPROT, INR in the last 72 hours. Hepatitis Panel No results for input(s): HEPBSAG, HCVAB, HEPAIGM, HEPBIGM in the last 72 hours.  Studies/Results: DG Chest Portable 1 View  Result Date: 12/07/2019 CLINICAL DATA:  44 year old female with shortness of breath. EXAM: PORTABLE CHEST 1 VIEW COMPARISON:  Chest radiograph dated 07/10/2018 and CT dated 07/11/2018 FINDINGS: There is cardiomegaly. Diffuse interstitial prominence and hazy airspace densities most concerning for  edema. Patchy area of airspace opacity at the right lung base may represent edema but pneumonia is not excluded. Clinical correlation is recommended. Probable trace bilateral pleural effusions. No pneumothorax. No acute osseous pathology. IMPRESSION: Cardiomegaly with findings of CHF. Pneumonia is not excluded. Electronically Signed   By: Anner Crete M.D.   On: 12/07/2019 17:22    Assessment/Plan:  ESRD-Tuesday Thursday Saturday dialysis patient has been losing weight.  She also has been not taking antihypertensive medications.  She now has dyspnea and acute pulmonary edema.  We will rule out dissecting aneurysm.  I recommend 2D echo to rule out valvular regurgitation.  We will dialyze for volume removal and will ultrafilter tonight 3 to 4 L.  We will place her in for dialysis 12/08/2019 which will be a regular dialysis treatment.  ANEMIA-not an issue at this present time.  MBD-continue binders.  Vitamin D 1.25 mcg 3 times weekly.  Sensipar 30 mg 3 times weekly.  These medicines can be administered in hospital.  I would follow a calcium and phosphorus  HTN/VOL-marked volume overload with cardiomegaly.  Recommend CT scan to rule out acute dissection.  2D echo in a.m. recommended.  Dialysis urgently for volume removal  ACCESS-AV fistula left upper arm with thrill and bruit  Seizure disorder continue Keppra.  History of intracerebral hemorrhage 02/21/2019.  Recommendations have been to perform a follow-up MRI by her neurologist Dr. Darden Dates.  This has not been done.  I wonder if this could be done while she is inpatient.  LOS: 0 Sherril Croon @TODAY @7 :36 PM

## 2019-12-07 NOTE — ED Triage Notes (Signed)
Pt BIB [facility] from home, pt c/o sudden onset shortness of breath. Denies known sick contacts, but does report cough. Dialysis Tues/Thurs/Sat, states she is compliant with dialysis. Hypertensive with EMS, SBP 200s.

## 2019-12-08 ENCOUNTER — Observation Stay (HOSPITAL_COMMUNITY): Payer: Medicare HMO

## 2019-12-08 ENCOUNTER — Inpatient Hospital Stay (HOSPITAL_COMMUNITY): Payer: Medicare HMO

## 2019-12-08 DIAGNOSIS — N2581 Secondary hyperparathyroidism of renal origin: Secondary | ICD-10-CM | POA: Diagnosis present

## 2019-12-08 DIAGNOSIS — D649 Anemia, unspecified: Secondary | ICD-10-CM | POA: Diagnosis present

## 2019-12-08 DIAGNOSIS — F17210 Nicotine dependence, cigarettes, uncomplicated: Secondary | ICD-10-CM | POA: Diagnosis present

## 2019-12-08 DIAGNOSIS — J9601 Acute respiratory failure with hypoxia: Secondary | ICD-10-CM | POA: Diagnosis present

## 2019-12-08 DIAGNOSIS — R0602 Shortness of breath: Secondary | ICD-10-CM

## 2019-12-08 DIAGNOSIS — I1311 Hypertensive heart and chronic kidney disease without heart failure, with stage 5 chronic kidney disease, or end stage renal disease: Secondary | ICD-10-CM | POA: Diagnosis present

## 2019-12-08 DIAGNOSIS — R0603 Acute respiratory distress: Secondary | ICD-10-CM | POA: Diagnosis present

## 2019-12-08 DIAGNOSIS — G40909 Epilepsy, unspecified, not intractable, without status epilepticus: Secondary | ICD-10-CM | POA: Diagnosis present

## 2019-12-08 DIAGNOSIS — J810 Acute pulmonary edema: Secondary | ICD-10-CM | POA: Diagnosis present

## 2019-12-08 DIAGNOSIS — Z9119 Patient's noncompliance with other medical treatment and regimen: Secondary | ICD-10-CM | POA: Diagnosis not present

## 2019-12-08 DIAGNOSIS — N186 End stage renal disease: Secondary | ICD-10-CM | POA: Diagnosis present

## 2019-12-08 DIAGNOSIS — Z79899 Other long term (current) drug therapy: Secondary | ICD-10-CM | POA: Diagnosis not present

## 2019-12-08 DIAGNOSIS — Z992 Dependence on renal dialysis: Secondary | ICD-10-CM | POA: Diagnosis not present

## 2019-12-08 DIAGNOSIS — Z20822 Contact with and (suspected) exposure to covid-19: Secondary | ICD-10-CM | POA: Diagnosis present

## 2019-12-08 DIAGNOSIS — E8770 Fluid overload, unspecified: Secondary | ICD-10-CM | POA: Diagnosis present

## 2019-12-08 DIAGNOSIS — Z9114 Patient's other noncompliance with medication regimen: Secondary | ICD-10-CM | POA: Diagnosis not present

## 2019-12-08 DIAGNOSIS — I161 Hypertensive emergency: Secondary | ICD-10-CM | POA: Diagnosis present

## 2019-12-08 LAB — ECHOCARDIOGRAM COMPLETE: Weight: 1855.39 oz

## 2019-12-08 LAB — CBC
HCT: 30.9 % — ABNORMAL LOW (ref 36.0–46.0)
Hemoglobin: 10.1 g/dL — ABNORMAL LOW (ref 12.0–15.0)
MCH: 33.1 pg (ref 26.0–34.0)
MCHC: 32.7 g/dL (ref 30.0–36.0)
MCV: 101.3 fL — ABNORMAL HIGH (ref 80.0–100.0)
Platelets: 99 10*3/uL — ABNORMAL LOW (ref 150–400)
RBC: 3.05 MIL/uL — ABNORMAL LOW (ref 3.87–5.11)
RDW: 14.6 % (ref 11.5–15.5)
WBC: 6.7 10*3/uL (ref 4.0–10.5)
nRBC: 0 % (ref 0.0–0.2)

## 2019-12-08 LAB — RENAL FUNCTION PANEL
Albumin: 4.2 g/dL (ref 3.5–5.0)
Anion gap: 16 — ABNORMAL HIGH (ref 5–15)
BUN: 44 mg/dL — ABNORMAL HIGH (ref 6–20)
CO2: 21 mmol/L — ABNORMAL LOW (ref 22–32)
Calcium: 9.5 mg/dL (ref 8.9–10.3)
Chloride: 96 mmol/L — ABNORMAL LOW (ref 98–111)
Creatinine, Ser: 7.27 mg/dL — ABNORMAL HIGH (ref 0.44–1.00)
GFR calc Af Amer: 7 mL/min — ABNORMAL LOW (ref >60)
GFR calc non Af Amer: 6 mL/min — ABNORMAL LOW (ref >60)
Glucose, Bld: 94 mg/dL (ref 70–99)
Phosphorus: 4.3 mg/dL (ref 2.5–4.6)
Potassium: 4.1 mmol/L (ref 3.5–5.1)
Sodium: 133 mmol/L — ABNORMAL LOW (ref 135–145)

## 2019-12-08 LAB — HIV ANTIBODY (ROUTINE TESTING W REFLEX): HIV Screen 4th Generation wRfx: NONREACTIVE

## 2019-12-08 MED ORDER — PENTAFLUOROPROP-TETRAFLUOROETH EX AERO
1.0000 "application " | INHALATION_SPRAY | CUTANEOUS | Status: DC | PRN
  Filled 2019-12-08: qty 116

## 2019-12-08 MED ORDER — HEPARIN SODIUM (PORCINE) 1000 UNIT/ML DIALYSIS: 1000.0000 [IU] | INTRAMUSCULAR | Status: DC | PRN

## 2019-12-08 MED ORDER — CALCITRIOL 0.25 MCG PO CAPS
ORAL_CAPSULE | ORAL | Status: AC
  Filled 2019-12-08: qty 1

## 2019-12-08 MED ORDER — ALTEPLASE 2 MG IJ SOLR: 2.0000 mg | Freq: Once | INTRAMUSCULAR | Status: DC | PRN

## 2019-12-08 MED ORDER — LIDOCAINE HCL (PF) 1 % IJ SOLN: 5.0000 mL | INTRAMUSCULAR | Status: DC | PRN

## 2019-12-08 MED ORDER — SODIUM CHLORIDE 0.9 % IV SOLN: 100.0000 mL | INTRAVENOUS | Status: DC | PRN

## 2019-12-08 MED ORDER — HEPARIN SODIUM (PORCINE) 1000 UNIT/ML DIALYSIS
1000.0000 [IU] | INTRAMUSCULAR | Status: DC | PRN
  Filled 2019-12-08: qty 1

## 2019-12-08 MED ORDER — CALCITRIOL 0.5 MCG PO CAPS
ORAL_CAPSULE | ORAL | Status: AC
  Filled 2019-12-08: qty 2

## 2019-12-08 MED ORDER — SIMETHICONE 80 MG PO CHEW
80.0000 mg | CHEWABLE_TABLET | Freq: Once | ORAL | Status: DC
  Filled 2019-12-08: qty 1

## 2019-12-08 MED ORDER — LIDOCAINE-PRILOCAINE 2.5-2.5 % EX CREA: 1.0000 "application " | TOPICAL_CREAM | CUTANEOUS | Status: DC | PRN

## 2019-12-08 MED ORDER — LIDOCAINE-PRILOCAINE 2.5-2.5 % EX CREA
1.0000 "application " | TOPICAL_CREAM | CUTANEOUS | Status: DC | PRN
  Filled 2019-12-08: qty 5

## 2019-12-08 MED ORDER — PENTAFLUOROPROP-TETRAFLUOROETH EX AERO: 1.0000 "application " | INHALATION_SPRAY | CUTANEOUS | Status: DC | PRN

## 2019-12-08 NOTE — Progress Notes (Addendum)
[facility] Progress Note   Subjective:   Pt seen on HD. Also had dialysis last night with net UF 2.5L. SOB is resolved, O2 sat >90% on RA. BP is still high and tachycardic- pt reports this happens when she does not take her carvedilol regularly which she was out of for about 4 days. She denies SOB, CP, palpitations, dizziness, abdominal pain, N/V/D. She feels better-  Less SOB-  Thinks might be ready to go home-  Planning on post HD weight measurement   Objective Vitals:   12/08/19 0300 12/08/19 0303 12/08/19 0515 12/08/19 0730  BP: (!) 153/106 (!) 153/106 (!) 163/107 (!) 163/108  Pulse: 98  (!) 106 (!) 102  Resp: 17  20 (!) 27  Temp:      TempSrc:      SpO2: 99%  93% 94%   Physical Exam General: Well developed female, alert and in NAD Heart: Tachycardic, regular rhythm. No murmurs, normal S1/S2 Lungs: CTA bilaterally without wheezing, rhonchi or rales Abdomen: Soft, non-tender, non-distended. +BS Extremities: No edema b/l lower extremities Dialysis Access:  LUE AVF currently accessed, small amount of oozing blood noted from needle site  Additional Objective Labs: Basic Metabolic Panel: Recent Labs  Lab 12/07/19 1706 12/07/19 1743  NA 139 138  K 3.7 3.8  CL 100 99  CO2  --  23  GLUCOSE 161* 161*  BUN 37* 33*  CREATININE 6.70* 6.23*  CALCIUM  --  9.3  PHOS  --  3.0    CBC: Recent Labs  Lab 12/07/19 1706 12/07/19 1743  WBC  --  5.4  NEUTROABS  --  3.4  HGB 11.9* 11.3*  HCT 35.0* 35.1*  MCV  --  102.9*  PLT  --  109*   Blood Culture    Component Value Date/Time   SDES URINE, RANDOM 02/19/2019 1538   SPECREQUEST  02/19/2019 1538    NONE Performed at [facility], [place] [address], [place], [place] [address]    CULT (A) 02/19/2019 1538    40,000 COLONIES/mL MULTIPLE SPECIES PRESENT, SUGGEST RECOLLECTION   REPTSTATUS 02/20/2019 FINAL 02/19/2019 1538    Studies/Results: MR BRAIN WO CONTRAST  Result Date: 12/08/2019 CLINICAL DATA:   Shortness of breath.  Seizures. EXAM: MRI HEAD WITHOUT CONTRAST TECHNIQUE: Multiplanar, multiecho pulse sequences of the brain and surrounding structures were obtained without intravenous contrast. 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 Volume loss and encephalomalacia is evident. No acute infarct, acute hemorrhage or mass lesion is present. Periventricular and subcortical white matter changes are moderately advanced for age. Moderate atrophy is noted. These findings are stable. Remote lacunar infarcts are present in the brainstem. Cerebellum is unremarkable. Vascular: Flow is present in the major intracranial arteries. Skull and upper cervical spine: The craniocervical junction is normal. Upper cervical spine is within normal limits. Marrow signal is unremarkable. Sinuses/Orbits: The paranasal sinuses and mastoid air cells are clear. The globes and orbits are within normal limits. IMPRESSION: 1. No acute intracranial abnormality or significant interval change. 2. 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 With expected evolution encephalomalacia. No acute hemorrhage. 3. Atrophy and white matter disease is moderately advanced for age. This likely reflects the sequela of chronic microvascular ischemia. Electronically Signed   By: San Morelle M.D.   On: 12/08/2019 06:54   DG Chest Portable 1 View  Result Date: 12/07/2019 CLINICAL DATA:  44 year old female with shortness of breath.  EXAM: PORTABLE CHEST 1 VIEW COMPARISON:  Chest radiograph dated 07/10/2018 and CT dated 07/11/2018 FINDINGS: There is cardiomegaly. Diffuse interstitial prominence and hazy airspace densities most concerning for edema. Patchy area of airspace opacity at the right lung base may represent edema but pneumonia is not excluded. Clinical correlation is recommended. Probable trace bilateral pleural effusions. No pneumothorax. No acute  osseous pathology. IMPRESSION: Cardiomegaly with findings of CHF. Pneumonia is not excluded. Electronically Signed   By: Anner Crete M.D.   On: 12/07/2019 17:22   CT Angio Chest/Abd/Pel for Dissection W and/or W/WO  Result Date: 12/07/2019 CLINICAL DATA:  44 year old female with shortness of breath. Concern for aortic dissection. EXAM: CT ANGIOGRAPHY CHEST, ABDOMEN AND PELVIS TECHNIQUE: Non-contrast CT of the chest was initially obtained. Multidetector CT imaging through the chest, abdomen and pelvis was performed using the standard protocol during bolus administration of intravenous contrast. Multiplanar reconstructed images and MIPs were obtained and reviewed to evaluate the vascular anatomy. CONTRAST:  145mL OMNIPAQUE IOHEXOL 350 MG/ML SOLN COMPARISON:  Chest radiograph dated 12/07/2019 and CT dated 07/11/2018. FINDINGS: CTA CHEST FINDINGS Cardiovascular: There is mild cardiomegaly, improved since the prior CT. Small pericardial effusion measures approximately 9 mm in thickness anterior to the heart. The thoracic aorta is unremarkable. The origins of the great vessels of the aortic arch appear patent. Mild dilatation of the main pulmonary trunk suggestive of a degree of pulmonary hypertension. Clinical correlation is recommended. No CT evidence of pulmonary embolism. Mediastinum/Nodes: No hilar adenopathy. Top-normal mediastinal lymph nodes. The esophagus and the thyroid gland are grossly unremarkable. No mediastinal fluid collection. Lungs/Pleura: Diffuse bilateral ground-glass and confluent hazy airspace opacities may represent edema or pneumonia. Clinical correlation and follow-up to resolution recommended. Left lung base linear atelectasis/scarring. No lobar consolidation, pleural effusion, or pneumothorax. The central airways are patent. Musculoskeletal: No acute osseous pathology. Review of the MIP images confirms the above findings. CTA ABDOMEN AND PELVIS FINDINGS VASCULAR Aorta: Mild  atherosclerotic calcification. No aneurysmal dilatation or dissection. Celiac: Patent without evidence of aneurysm, dissection, vasculitis or significant stenosis. SMA: Patent without evidence of aneurysm, dissection, vasculitis or significant stenosis. Renals: Both renal arteries are patent without evidence of aneurysm, dissection, vasculitis, fibromuscular dysplasia or significant stenosis. IMA: Patent without evidence of aneurysm, dissection, vasculitis or significant stenosis. Inflow: Mild atherosclerotic calcification. No aneurysmal dilatation or dissection. The iliac arteries are patent. Veins: No obvious venous abnormality within the limitations of this arterial phase study. Review of the MIP images confirms the above findings. NON-VASCULAR No intra-abdominal free air. Trace free fluid in the pelvis. Hepatobiliary: No focal liver abnormality is seen. No gallstones, gallbladder wall thickening, or biliary dilatation. Pancreas: Unremarkable. No pancreatic ductal dilatation or surrounding inflammatory changes. Spleen: Normal in size without focal abnormality. Adrenals/Urinary Tract: The adrenal glands are unremarkable. Mild bilateral renal atrophy. No hydronephrosis on either side. There is a 5 mm nonobstructing right renal inferior pole calculus. The visualized ureters and urinary bladder appear unremarkable. Stomach/Bowel: There is colonic diverticulosis without active inflammatory changes. There is no bowel obstruction or active inflammation. The appendix is normal. Lymphatic: No adenopathy. Reproductive: Enlarged uterus, possibly related to fibroid. This can be better evaluated with ultrasound on nonemergent/outpatient basis. The ovaries are grossly unremarkable. Mild prominence of the gonadal vein right greater than left. Other: None Musculoskeletal: No acute osseous pathology. Indeterminate 2 cm lucent lesion with sclerotic margins in the proximal right femoral diaphysis does not demonstrate aggressive  features and appears similar to the study of 2019. Review of the MIP  images confirms the above findings. IMPRESSION: 1. No aortic dissection or aneurysm. No CT evidence of pulmonary embolism. 2. Mild cardiomegaly and small pericardial effusion, significantly improved since the prior CT. 3. Diffuse bilateral ground-glass and confluent hazy airspace opacities may represent edema or pneumonia. Clinical correlation and follow-up to resolution recommended. 4. Colonic diverticulosis. No bowel obstruction. Normal appendix. 5. Enlarged uterus, possibly related to fibroid. This can be better evaluated with ultrasound on nonemergent/outpatient basis. 6. A 5 mm nonobstructing right renal inferior pole calculus. No hydronephrosis. 7. Aortic Atherosclerosis (ICD10-I70.0). Electronically Signed   By: Anner Crete M.D.   On: 12/07/2019 20:24   Medications: . sodium chloride    . sodium chloride     . amLODipine  10 mg Oral QHS  . atorvastatin  20 mg Oral q1800  . calcitRIOL  1.25 mcg Oral Q T,Th,Sa-HD  . carvedilol  25 mg Oral BID WC  . Chlorhexidine Gluconate Cloth  6 each Topical Q0600  . Chlorhexidine Gluconate Cloth  6 each Topical Q0600  . cinacalcet  30 mg Oral Q T,Th,Sa-HD  . hydrALAZINE  100 mg Oral BID  . levETIRAcetam  500 mg Oral BID  . multivitamin  1 tablet Oral QHS  . sevelamer carbonate  1,600 mg Oral TID WC  . spironolactone  50 mg Oral Daily    Dialysis Orders: [facility] TTS 4 hours blood flow rate 350 dialysis rate for 600 cc estimated dry weight 58 kg potassium 2 calcium 2 sodium 137 bicarbonate 35  Left upper arm AV fistula with flows greater than 2000 12/03/2019  Last hemoglobin 10.1  Micera 75 mcg every 4 weeks Venofer 50 mg once weekly  Assessment/Plan: 1. Shortness of breath/HTN: Pt missed dialysis and BP meds prior to admission. SOB improved but remains hypertensive. Further volume reduction with HD today. Pt reports she cannot tolerate UF more than  2.5L, goal lowered. Home BP meds resumed. CTA with no aortic dissection/aneurysm or PE but possible edema or pneumonia. Seems to be that she has lost weight and EDW not lowered aggressively enough along with missing BP meds-  Post HD weight today so can get EDW where it needs to be as OP  2. ESRD: Had extra HD overnight for volume, now back on schedule with SOB significantly improved. K+ 3.8 yesterday, change to 3K bath and repeating labs this AM. Continue TTS schedule. Probably does not need to stay til Sat 3. Anemia: Hgb at goal. No ESA/Fe indicated.  4. Secondary hyperparathyroidism:  Calcium and phos at goal. Continue calcitriol, sensipar and renvela.  5. Nutrition:  Renal diet/fluid restrictions 6. 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 MRI repeated this AM per neurology recommendations and showed no acute abnormality or significant interval change.   Anice Paganini, PA-C 12/08/2019, 8:18 AM  [facility] Pager: [phone]  Patient seen and examined, agree with above note with above modifications. Seen on HD - second treatment in 12 hours.  Feels better-  BP is better as well.  Getting post HD weight-  Likely will be new weight as OP- resumed BP meds - can titrate further as OP Corliss Parish, MD 12/08/2019

## 2019-12-08 NOTE — Progress Notes (Signed)
  Echocardiogram 2D Echocardiogram has been performed.  Madeline Brown 12/08/2019, 3:25 PM

## 2019-12-08 NOTE — Procedures (Signed)
Patient was seen on dialysis and the procedure was supervised.  BFR 400  Via AVF BP is  164/112.   Patient appears to be tolerating treatment well- second treatment in 12 hours, SOB is better  Louis Meckel 12/08/2019

## 2019-12-08 NOTE — ED Notes (Signed)
Pt transported to MRI 

## 2019-12-08 NOTE — Progress Notes (Signed)
Pt unable to remove ordered fluid goal. We were only able to remove 900 mls d/t c/o cramping throughout the whole tx.

## 2019-12-08 NOTE — Progress Notes (Signed)
Pt taken off BIPAP and placed on 5L [place]. Pt tolerating well at this time. RT will continue to monitor.

## 2019-12-08 NOTE — ED Notes (Signed)
Tray ordered @ 13:30

## 2019-12-08 NOTE — ED Notes (Signed)
Tele dinner ordered

## 2019-12-08 NOTE — Progress Notes (Signed)
Subjective: HD 1 Overnight, patient admitted with acute severe symptomatic hypertension with flash pulmonary edema.   [name] was examined and evaluated at bedside in ED. She mentions that she has been endorsing significant shortness of breath which improved since coming to the ED. She provides additional history stating last Friday was the onset of symptoms which progressively got worse during the weekend. She mentions running out of hydralazine and carvedilol but she was taking her spironolactone and amlodipine prior to onset of symptoms.  Objective:  Vital signs in last 24 hours: Vitals:   12/08/19 0815 12/08/19 0830 12/08/19 0900 12/08/19 0930  BP: (!) 195/121 (!) 176/111 (!) 163/108 (!) 164/112  Pulse: (!) 121 (!) 113 (!) 109 (!) 114  Resp:      Temp:      TempSrc:      SpO2:       CBC Latest Ref Rng & Units 12/08/2019 12/07/2019 12/07/2019  WBC 4.0 - 10.5 K/uL 6.7 5.4 -  Hemoglobin 12.0 - 15.0 g/dL 10.1(L) 11.3(L) 11.9(L)  Hematocrit 36 - 46 % 30.9(L) 35.1(L) 35.0(L)  Platelets 150 - 400 K/uL 99(L) 109(L) -   BMP Latest Ref Rng & Units 12/08/2019 12/07/2019 12/07/2019  Glucose 70 - 99 mg/dL 94 161(H) 161(H)  BUN 6 - 20 mg/dL 44(H) 33(H) 37(H)  Creatinine 0.44 - 1.00 mg/dL 7.27(H) 6.23(H) 6.70(H)  BUN/Creat Ratio 9 - 23 - - -  Sodium 135 - 145 mmol/L 133(L) 138 139  Potassium 3.5 - 5.1 mmol/L 4.1 3.8 3.7  Chloride 98 - 111 mmol/L 96(L) 99 100  CO2 22 - 32 mmol/L 21(L) 23 -  Calcium 8.9 - 10.3 mg/dL 9.5 9.3 -   Physical Exam  Constitutional: Appears well-developed and well-nourished. No distress.  HENT:  Normocephalic and atraumatic. EOMI, MMM, Conjunctivae are normal.  Cardiovascular: Tachycardic, S1 and S2 present, no murmurs/rubs/gallops Respiratory: Effort normal and breath sounds normal. On 2L [place], no wheezing, rales or rhonchi GI: Soft. Bowel sounds are normal. No distension. There is no tenderness.  Musculoskeletal: No bilateral lower extremity edema noted    Neurological: Is alert and oriented x4, no apparent focal neurologic deficits.  Skin: Warm and dry.   Assessment/Plan:  Principal Problem:   Hypertensive urgency Active Problems:   ESRD (end stage renal disease) ([place])   Intracranial hematoma ([place]) [name] is a 44 year old female with PMHx of ESRD on HD TThS, hypertension and ICH with related seizures admitted for hypertensive emergency.   Hypertensive emergency: Patient presented with acute respiratory distress in setting of hypertension. Patient noted to have flash pulmonary edema and improved with BiPAP. Patient endorses medication noncompliance with her antihypertensives (hydralazine and carvedilol). She did have a short HD session overnight and notes improvement in her breathing overall since then. SBP remains elevated. Patient would benefit from further volume removal. - Repeat HD session this AM - Will need to reduce BP gradually over next 24 hours (goal SBP 140-180) - Home medications resumed: amlodipine 10mg  daily, carvedilol 25mg  bid, hydralazine 100mg  bid, spironolactone 50mg  daily  ESRD on HD: Patient with history of ESRD on HD TThS; endorses missing 2 sessions per month, but none one week prior to admission. Renal function currently stable. Patient may benefit from increased volume removal at HD.  - HD per nephrology - Continue to monitor renal function - Avoid nephrotoxic agents   ICH Hx of seizures: Patient with prior admission for seizures in setting of ICH. Repeat MRI without significant interval change;  remote hemorrhagic infarction of the left parietal lobe with evolution encephalomalacia. No acute hemorrhage noted. She is on Keppra 500mg  bid - Continue Keppra 500mg  bid  FEN/GI: Diet: Renal  Fluids: None Electrolytes: Monitor and replete prn  DVT Prophylaxis: SCDs Code status: FULL   Prior to Admission Living Arrangement: Home Anticipated Discharge Location: Home  Barriers to Discharge: Continued  medical management  Dispo: Anticipated discharge in approximately 1-2 day(s).   Harvie Heck, MD  Internal Medicine, PGY-1 12/08/2019, 9:58 AM Pager: [phone] After 5pm on weekdays and 1pm on weekends: On Call Pager: [phone]

## 2019-12-09 DIAGNOSIS — E8770 Fluid overload, unspecified: Secondary | ICD-10-CM | POA: Insufficient documentation

## 2019-12-09 LAB — RENAL FUNCTION PANEL
Albumin: 4.1 g/dL (ref 3.5–5.0)
Anion gap: 15 (ref 5–15)
BUN: 38 mg/dL — ABNORMAL HIGH (ref 6–20)
CO2: 25 mmol/L (ref 22–32)
Calcium: 10.1 mg/dL (ref 8.9–10.3)
Chloride: 95 mmol/L — ABNORMAL LOW (ref 98–111)
Creatinine, Ser: 5.91 mg/dL — ABNORMAL HIGH (ref 0.44–1.00)
GFR calc Af Amer: 9 mL/min — ABNORMAL LOW (ref >60)
GFR calc non Af Amer: 8 mL/min — ABNORMAL LOW (ref >60)
Glucose, Bld: 128 mg/dL — ABNORMAL HIGH (ref 70–99)
Phosphorus: 3.7 mg/dL (ref 2.5–4.6)
Potassium: 3.8 mmol/L (ref 3.5–5.1)
Sodium: 135 mmol/L (ref 135–145)

## 2019-12-09 MED ORDER — AMLODIPINE BESYLATE 10 MG PO TABS: 10.0000 mg | ORAL_TABLET | Freq: Every day | ORAL | 0 refills | Status: DC

## 2019-12-09 MED ORDER — CARVEDILOL 25 MG PO TABS: 25.0000 mg | ORAL_TABLET | Freq: Two times a day (BID) | ORAL | 0 refills | Status: DC

## 2019-12-09 MED ORDER — SPIRONOLACTONE 50 MG PO TABS: 50.0000 mg | ORAL_TABLET | Freq: Every day | ORAL | 0 refills | Status: DC

## 2019-12-09 MED ORDER — HYDRALAZINE HCL 50 MG PO TABS
100.0000 mg | ORAL_TABLET | Freq: Three times a day (TID) | ORAL | Status: DC
  Administered 2019-12-09: 100 mg via ORAL
  Filled 2019-12-09: qty 2

## 2019-12-09 MED ORDER — HYDRALAZINE HCL 100 MG PO TABS: 100.0000 mg | ORAL_TABLET | Freq: Three times a day (TID) | ORAL | 0 refills | Status: DC

## 2019-12-09 MED FILL — hydrALAZINE HCL 100 MG TABS: 100 | 30 days supply | Qty: 90 | Fill #0

## 2019-12-09 MED FILL — CARVEDILOL 25 MG TABLET: 25 | 30 days supply | Qty: 60 | Fill #0

## 2019-12-09 MED FILL — SPIRONOLACTONE 50 MG TAB: 50 | 30 days supply | Qty: 30 | Fill #0

## 2019-12-09 MED FILL — AMLODIPINE BESYLATE 10 MG T: 10 | 30 days supply | Qty: 30 | Fill #0

## 2019-12-09 NOTE — Progress Notes (Signed)
[facility] Progress Note   Subjective:   S/p HD yest- removed 900 ?  Ending weight between 52.2 to 52.6 (EDW at OP center 58)  On room air.  BP still 150-160/100s to 110's  Objective Vitals:   12/08/19 2115 12/08/19 2126 12/08/19 2149 12/09/19 0612  BP: (!) 153/114  (!) 164/111 (!) 161/105  Pulse: 98  97 91  Resp:   18 14  Temp:  99.1 F (37.3 C) 99.4 F (37.4 C) 99.1 F (37.3 C)  TempSrc:   Oral Oral  SpO2: 96%  99% 95%  Weight:   52.2 kg   Height:   5' (1.524 m)    Physical Exam General: Well developed female, alert and in NAD Heart: Tachycardic, regular rhythm. No murmurs, normal S1/S2 Lungs: CTA bilaterally without wheezing, rhonchi or rales Abdomen: Soft, non-tender, non-distended. +BS Extremities: No edema b/l lower extremities Dialysis Access:  LUE AVF patent  Additional Objective Labs: Basic Metabolic Panel: Recent Labs  Lab 12/07/19 1706 12/07/19 1743 12/08/19 0800  NA 139 138 133*  K 3.7 3.8 4.1  CL 100 99 96*  CO2  --  23 21*  GLUCOSE 161* 161* 94  BUN 37* 33* 44*  CREATININE 6.70* 6.23* 7.27*  CALCIUM  --  9.3 9.5  PHOS  --  3.0 4.3    CBC: Recent Labs  Lab 12/07/19 1706 12/07/19 1743 12/08/19 0800  WBC  --  5.4 6.7  NEUTROABS  --  3.4  --   HGB 11.9* 11.3* 10.1*  HCT 35.0* 35.1* 30.9*  MCV  --  102.9* 101.3*  PLT  --  109* 99*   Blood Culture    Component Value Date/Time   SDES URINE, RANDOM 02/19/2019 1538   SPECREQUEST  02/19/2019 1538    NONE Performed at [facility], [place] [address] [place], [place] [address]    CULT (A) 02/19/2019 1538    40,000 COLONIES/mL MULTIPLE SPECIES PRESENT, SUGGEST RECOLLECTION   REPTSTATUS 02/20/2019 FINAL 02/19/2019 1538    Studies/Results: MR BRAIN WO CONTRAST  Result Date: 12/08/2019 CLINICAL DATA:  Shortness of breath.  Seizures. EXAM: MRI HEAD WITHOUT CONTRAST TECHNIQUE: Multiplanar, multiecho pulse sequences of the brain and surrounding structures were obtained  without intravenous contrast. 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 Volume loss and encephalomalacia is evident. No acute infarct, acute hemorrhage or mass lesion is present. Periventricular and subcortical white matter changes are moderately advanced for age. Moderate atrophy is noted. These findings are stable. Remote lacunar infarcts are present in the brainstem. Cerebellum is unremarkable. Vascular: Flow is present in the major intracranial arteries. Skull and upper cervical spine: The craniocervical junction is normal. Upper cervical spine is within normal limits. Marrow signal is unremarkable. Sinuses/Orbits: The paranasal sinuses and mastoid air cells are clear. The globes and orbits are within normal limits. IMPRESSION: 1. No acute intracranial abnormality or significant interval change. 2. 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 With expected evolution encephalomalacia. No acute hemorrhage. 3. Atrophy and white matter disease is moderately advanced for age. This likely reflects the sequela of chronic microvascular ischemia. Electronically Signed   By: San Morelle M.D.   On: 12/08/2019 06:54   DG Chest Portable 1 View  Result Date: 12/07/2019 CLINICAL DATA:  44 year old female with shortness of breath. EXAM: PORTABLE CHEST 1 VIEW COMPARISON:  Chest radiograph dated 07/10/2018 and CT dated 07/11/2018 FINDINGS: There is cardiomegaly. Diffuse  interstitial prominence and hazy airspace densities most concerning for edema. Patchy area of airspace opacity at the right lung base may represent edema but pneumonia is not excluded. Clinical correlation is recommended. Probable trace bilateral pleural effusions. No pneumothorax. No acute osseous pathology. IMPRESSION: Cardiomegaly with findings of CHF. Pneumonia is not excluded. Electronically Signed   By: Anner Crete M.D.   On: 12/07/2019 17:22    ECHOCARDIOGRAM COMPLETE  Result Date: 12/08/2019    ECHOCARDIOGRAM REPORT   Patient Name:   DOREENA MAULDEN Date of Exam: 12/08/2019 Medical Rec #:  [id number]      Height:       60.0 in Accession #:    [id number]     Weight:       132.0 lb Date of Birth:  1976/02/22       BSA:          1.564 m Patient Age:    63 years       BP:           176/115 mmHg Patient Gender: F              HR:           105 bpm. Exam Location:  Inpatient Procedure: 2D Echo Indications:    dyspnea 786.09  History:        Patient has prior history of Echocardiogram examinations, most                 recent 11/11/2018. End stage renal disease; Risk                 Factors:Hypertension.  Sonographer:    Johny Chess Referring Phys: [id number] ERIK C HOFFMAN IMPRESSIONS  1. Left ventricular ejection fraction, by estimation, is 60 to 65%. The left ventricle has normal function. The left ventricle has no regional wall motion abnormalities. There is severe left ventricular hypertrophy. Left ventricular diastolic parameters  are indeterminate.  2. Right ventricular systolic function is normal. The right ventricular size is normal. Tricuspid regurgitation signal is inadequate for assessing PA pressure.  3. The mitral valve is normal in structure. No evidence of mitral valve regurgitation.  4. The aortic valve is tricuspid. Aortic valve regurgitation is not visualized. No aortic stenosis is present.  5. The inferior vena cava is normal in size with greater than 50% respiratory variability, suggesting right atrial pressure of 3 mmHg. FINDINGS  Left Ventricle: Left ventricular ejection fraction, by estimation, is 60 to 65%. The left ventricle has normal function. The left ventricle has no regional wall motion abnormalities. The left ventricular internal cavity size was normal in size. There is  severe left ventricular hypertrophy. Left ventricular diastolic parameters are indeterminate. Right Ventricle: The right ventricular size is normal. Right  vetricular wall thickness was not assessed. Right ventricular systolic function is normal. Tricuspid regurgitation signal is inadequate for assessing PA pressure. Left Atrium: Left atrial size was mildly dilated. Right Atrium: Right atrial size was mildly dilated. Pericardium: There is no evidence of pericardial effusion. Mitral Valve: The mitral valve is normal in structure. No evidence of mitral valve regurgitation. Tricuspid Valve: The tricuspid valve is normal in structure. Tricuspid valve regurgitation is trivial. Aortic Valve: The aortic valve is tricuspid. Aortic valve regurgitation is not visualized. No aortic stenosis is present. Pulmonic Valve: The pulmonic valve was not well visualized. Pulmonic valve regurgitation is not visualized. Aorta: The aortic root is normal in size and structure. Venous: The inferior vena cava is normal in size  with greater than 50% respiratory variability, suggesting right atrial pressure of 3 mmHg. IAS/Shunts: No atrial level shunt detected by color flow Doppler.  LEFT VENTRICLE PLAX 2D LVIDd:         4.20 cm  Diastology LVIDs:         3.20 cm  LV e' medial: 8.83 cm/s LV PW:         1.30 cm LV IVS:        1.50 cm LVOT diam:     2.00 cm LV SV:         63 LV SV Index:   41 LVOT Area:     3.14 cm  RIGHT VENTRICLE             IVC RV S prime:     15.90 cm/s  IVC diam: 0.80 cm TAPSE (M-mode): 2.4 cm AORTIC VALVE LVOT Vmax:   127.00 cm/s LVOT Vmean:  90.900 cm/s LVOT VTI:    0.202 m  AORTA Ao Root diam: 3.20 cm  SHUNTS Systemic VTI:  0.20 m Systemic Diam: 2.00 cm Oswaldo Milian MD Electronically signed by Oswaldo Milian MD Signature Date/Time: 12/08/2019/7:21:59 PM    Final    CT Angio Chest/Abd/Pel for Dissection W and/or W/WO  Result Date: 12/07/2019 CLINICAL DATA:  44 year old female with shortness of breath. Concern for aortic dissection. EXAM: CT ANGIOGRAPHY CHEST, ABDOMEN AND PELVIS TECHNIQUE: Non-contrast CT of the chest was initially obtained. Multidetector CT  imaging through the chest, abdomen and pelvis was performed using the standard protocol during bolus administration of intravenous contrast. Multiplanar reconstructed images and MIPs were obtained and reviewed to evaluate the vascular anatomy. CONTRAST:  19mL OMNIPAQUE IOHEXOL 350 MG/ML SOLN COMPARISON:  Chest radiograph dated 12/07/2019 and CT dated 07/11/2018. FINDINGS: CTA CHEST FINDINGS Cardiovascular: There is mild cardiomegaly, improved since the prior CT. Small pericardial effusion measures approximately 9 mm in thickness anterior to the heart. The thoracic aorta is unremarkable. The origins of the great vessels of the aortic arch appear patent. Mild dilatation of the main pulmonary trunk suggestive of a degree of pulmonary hypertension. Clinical correlation is recommended. No CT evidence of pulmonary embolism. Mediastinum/Nodes: No hilar adenopathy. Top-normal mediastinal lymph nodes. The esophagus and the thyroid gland are grossly unremarkable. No mediastinal fluid collection. Lungs/Pleura: Diffuse bilateral ground-glass and confluent hazy airspace opacities may represent edema or pneumonia. Clinical correlation and follow-up to resolution recommended. Left lung base linear atelectasis/scarring. No lobar consolidation, pleural effusion, or pneumothorax. The central airways are patent. Musculoskeletal: No acute osseous pathology. Review of the MIP images confirms the above findings. CTA ABDOMEN AND PELVIS FINDINGS VASCULAR Aorta: Mild atherosclerotic calcification. No aneurysmal dilatation or dissection. Celiac: Patent without evidence of aneurysm, dissection, vasculitis or significant stenosis. SMA: Patent without evidence of aneurysm, dissection, vasculitis or significant stenosis. Renals: Both renal arteries are patent without evidence of aneurysm, dissection, vasculitis, fibromuscular dysplasia or significant stenosis. IMA: Patent without evidence of aneurysm, dissection, vasculitis or significant  stenosis. Inflow: Mild atherosclerotic calcification. No aneurysmal dilatation or dissection. The iliac arteries are patent. Veins: No obvious venous abnormality within the limitations of this arterial phase study. Review of the MIP images confirms the above findings. NON-VASCULAR No intra-abdominal free air. Trace free fluid in the pelvis. Hepatobiliary: No focal liver abnormality is seen. No gallstones, gallbladder wall thickening, or biliary dilatation. Pancreas: Unremarkable. No pancreatic ductal dilatation or surrounding inflammatory changes. Spleen: Normal in size without focal abnormality. Adrenals/Urinary Tract: The adrenal glands are unremarkable. Mild bilateral renal atrophy. No hydronephrosis  on either side. There is a 5 mm nonobstructing right renal inferior pole calculus. The visualized ureters and urinary bladder appear unremarkable. Stomach/Bowel: There is colonic diverticulosis without active inflammatory changes. There is no bowel obstruction or active inflammation. The appendix is normal. Lymphatic: No adenopathy. Reproductive: Enlarged uterus, possibly related to fibroid. This can be better evaluated with ultrasound on nonemergent/outpatient basis. The ovaries are grossly unremarkable. Mild prominence of the gonadal vein right greater than left. Other: None Musculoskeletal: No acute osseous pathology. Indeterminate 2 cm lucent lesion with sclerotic margins in the proximal right femoral diaphysis does not demonstrate aggressive features and appears similar to the study of 2019. Review of the MIP images confirms the above findings. IMPRESSION: 1. No aortic dissection or aneurysm. No CT evidence of pulmonary embolism. 2. Mild cardiomegaly and small pericardial effusion, significantly improved since the prior CT. 3. Diffuse bilateral ground-glass and confluent hazy airspace opacities may represent edema or pneumonia. Clinical correlation and follow-up to resolution recommended. 4. Colonic  diverticulosis. No bowel obstruction. Normal appendix. 5. Enlarged uterus, possibly related to fibroid. This can be better evaluated with ultrasound on nonemergent/outpatient basis. 6. A 5 mm nonobstructing right renal inferior pole calculus. No hydronephrosis. 7. Aortic Atherosclerosis (ICD10-I70.0). Electronically Signed   By: Anner Crete M.D.   On: 12/07/2019 20:24   Medications:   amLODipine  10 mg Oral QHS   atorvastatin  20 mg Oral q1800   calcitRIOL  1.25 mcg Oral Q T,Th,Sa-HD   carvedilol  25 mg Oral BID WC   Chlorhexidine Gluconate Cloth  6 each Topical Q0600   Chlorhexidine Gluconate Cloth  6 each Topical Q0600   cinacalcet  30 mg Oral Q T,Th,Sa-HD   hydrALAZINE  100 mg Oral TID   levETIRAcetam  500 mg Oral BID   multivitamin  1 tablet Oral QHS   sevelamer carbonate  1,600 mg Oral TID WC   simethicone  80 mg Oral Once   spironolactone  50 mg Oral Daily    Dialysis Orders: [facility] TTS 4 hours blood flow rate 350 dialysis rate for 600 cc estimated dry weight 58 kg potassium 2 calcium 2 sodium 137 bicarbonate 35  Left upper arm AV fistula with flows greater than 2000 12/03/2019  Last hemoglobin 10.1  Micera 75 mcg every 4 weeks Venofer 50 mg once weekly  Assessment/Plan: 1. Shortness of breath/HTN: Pt missed dialysis and BP meds prior to admission. SOB improved- approx 6 kg under her EDW.  but remains hypertensive. Home BP meds resumed- norvasc 10/coreg25 BID/hydralazine 100 TID and aldactone 50 daily. CTA with no aortic dissection/aneurysm or PE but possible edema or pneumonia. Seems to be that she has lost weight and EDW not lowered aggressively enough along with missing BP meds-  Post HD weight today so can get EDW where it needs to be as OP.  Echo shows LVH not surprising-  BP meds can be titrated further as OP   2. ESRD: Had extra HD overnight Wed for volume, now back on schedule with SOB significantly improved.  Continue TTS  schedule- will do dialysis here tomorrow if stays 3. Anemia: Hgb at goal. No ESA/Fe indicated.  4. Secondary hyperparathyroidism:  Calcium and phos at goal. Continue calcitriol, sensipar and renvela.  5. Nutrition:  Renal diet/fluid restrictions 6. 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 MRI repeated this AM per neurology recommendations and showed no acute abnormality or significant interval change.  7. Dispo-  From nephrology standpoint could go home  today and go to OP HD tomorrow   Corliss Parish, MD 12/09/2019

## 2019-12-09 NOTE — Progress Notes (Signed)
Pt resting in bed comfortably. No c/o pain or shortness of breath.  Lungs sound clear upper and lowers some crackles.  Ate 95% breakfast and 218mL fluids. Bp 161/105, scheduled medications administered & will recheck bp.

## 2019-12-09 NOTE — Discharge Summary (Addendum)
Name: Madeline Brown MRN: [id number] DOB: 1976-04-23 44 y.o. PCP: Patient, No Pcp Per  Date of Admission: 12/07/2019  4:49 PM Date of Discharge: 12/09/2019 Attending Physician: Dr. Joni Reining  Discharge Diagnosis: 1. Hypertensive emergency 2. ESRD on HD  3. Hx of ICH with subsequent seizures  Discharge Medications: Allergies as of 12/09/2019      Reactions   Visine [tetrahydrozoline Hcl] Swelling   Eyes Swelling      Medication List    TAKE these medications   amLODipine 10 MG tablet Commonly known as: NORVASC Take 1 tablet (10 mg total) by mouth at bedtime.   atorvastatin 20 MG tablet Commonly known as: LIPITOR Take 1 tablet (20 mg total) by mouth daily at 6 PM.   carvedilol 25 MG tablet Commonly known as: COREG Take 1 tablet (25 mg total) by mouth 2 (two) times daily with a meal. What changed: Another medication with the same name was removed. Continue taking this medication, and follow the directions you see here.   hydrALAZINE 100 MG tablet Commonly known as: APRESOLINE Take 1 tablet (100 mg total) by mouth 3 (three) times daily. What changed: when to take this   levETIRAcetam ER 1000 MG Tb24 Take 1,000 mg by mouth daily.   lidocaine-prilocaine cream Commonly known as: EMLA Apply 1 application topically See admin instructions. Apply small amount to access site one hour before dialysis (Tuesday, Thursday, Saturday). Cover with occlusive dressing (Saran wrap)   multivitamin Tabs tablet Take 1 tablet by mouth at bedtime. What changed: when to take this   DIALYVITE TABLET Tabs Take 1 tablet by mouth daily. What changed: Another medication with the same name was changed. Make sure you understand how and when to take each.   sevelamer carbonate 800 MG tablet Commonly known as: RENVELA Take 2 tablets (1,600 mg total) by mouth 3 (three) times daily with meals.   spironolactone 50 MG tablet Commonly known as: ALDACTONE Take 1 tablet (50 mg total) by mouth  daily.       Disposition and follow-up:   Ms.Emil Greis was discharged from [facility] in Stable condition.  At the hospital follow up visit please address:  1.  Hypertensive emergency:  - Increased hydralazine to 100mg   - Continue amlodipine 10mg  daily, carvedilol 25mg  bid and spironolactone 50mg  daily - Patient advised to monitor BP at home and f/u with PCP  ESRD on HD:  Dialyzed to new EDW ~52kg.  - Resume TThS HD schedule on discharge - Monitor renal function  Hx of ICH with subsequent seizures: - Repeat MRI without significant interval change, no acute hemorrhage - Continue keppra 500mg  bid   2.  Labs / imaging needed at time of follow-up: BMP  3.  Pending labs/ test needing follow-up: None  Follow-up Appointments:  Follow-up Information    Josue Hector, MD. Schedule an appointment as soon as possible for a visit in 1 week(s).   Specialty: Cardiology Contact information: [address] [place] [address] [place] by problem list: 1. Hypertensive emergency Patient presented with acute respiratory distress in setting of hypertension with pulmonary edema requiring BiPAP in setting of medication noncompliance. Patient endorsed not taking her hydralazine and carvedilol as prescribed. Patient's symptoms improved with volume removal via serial HD sessions. She was resumed on her home medications with improvement in BP. She was discharged home with home medications including  amlodipine 10mg  daily, carvedilol 25mg  2x/day, spironolactone 50mg  daily and hydralazine increased to 100mg  3x/day. She is to follow up with her PCP on discharge.  2. ESRD  Patient with history of ESRD on HD TThS; endorses missing 2 sessions per month, but none one week prior to admission. Patient's dry weight challenged during admission with serial HD sessions. New EDW ~52kg. Patient to resume HD per schedule on discharge.     3. Hx of ICH with seizures: Patient with prior admission for seizures in setting of ICH. Repeat MRI without significant interval change; remote hemorrhagic infarction of the left parietal lobe with evolution encephalomalacia. No acute hemorrhage noted. She is on Keppra 500mg  bid  Discharge Vitals:   BP (!) 161/105 (BP Location: Right Arm)   Pulse 91   Temp 99.1 F (37.3 C) (Oral)   Resp 14   Ht 5' (1.524 m)   Wt 52.2 kg   SpO2 95%   BMI 22.46 kg/m   Pertinent Labs, Studies, and Procedures:  CBC Latest Ref Rng & Units 12/08/2019 12/07/2019 12/07/2019  WBC 4.0 - 10.5 K/uL 6.7 5.4 -  Hemoglobin 12.0 - 15.0 g/dL 10.1(L) 11.3(L) 11.9(L)  Hematocrit 36 - 46 % 30.9(L) 35.1(L) 35.0(L)  Platelets 150 - 400 K/uL 99(L) 109(L) -   BMP Latest Ref Rng & Units 12/09/2019 12/08/2019 12/07/2019  Glucose 70 - 99 mg/dL 128(H) 94 161(H)  BUN 6 - 20 mg/dL 38(H) 44(H) 33(H)  Creatinine 0.44 - 1.00 mg/dL 5.91(H) 7.27(H) 6.23(H)  BUN/Creat Ratio 9 - 23 - - -  Sodium 135 - 145 mmol/L 135 133(L) 138  Potassium 3.5 - 5.1 mmol/L 3.8 4.1 3.8  Chloride 98 - 111 mmol/L 95(L) 96(L) 99  CO2 22 - 32 mmol/L 25 21(L) 23  Calcium 8.9 - 10.3 mg/dL 10.1 9.5 9.3   CXR 12/07/2019: FINDINGS: There is cardiomegaly. Diffuse interstitial prominence and hazy airspace densities most concerning for edema. Patchy area of airspace opacity at the right lung base may represent edema but pneumonia is not excluded. Clinical correlation is recommended. Probable trace bilateral pleural effusions. No pneumothorax. No acute osseous pathology.  CT ANGIO CHEST/ABDOMEN/PELVIS 12/07/2019: IMPRESSION: 1. No aortic dissection or aneurysm. No CT evidence of pulmonary embolism. 2. Mild cardiomegaly and small pericardial effusion, significantly improved since the prior CT. 3. Diffuse bilateral ground-glass and confluent hazy airspace opacities may represent edema or pneumonia. Clinical correlation and follow-up to resolution  recommended. 4. Colonic diverticulosis. No bowel obstruction. Normal appendix. 5. Enlarged uterus, possibly related to fibroid. This can be better evaluated with ultrasound on nonemergent/outpatient basis. 6. A 5 mm nonobstructing right renal inferior pole calculus. No Hydronephrosis.  MRI BRAIN WO CONTRAST 12/08/2019: IMPRESSION: 1. No acute intracranial abnormality or significant interval change. 2. 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 With expected evolution encephalomalacia. No acute hemorrhage. 3. Atrophy and white matter disease is moderately advanced for age. This likely reflects the sequela of chronic microvascular ischemia.  Discharge Instructions: Discharge Instructions    Call MD for:  difficulty breathing, headache or visual disturbances   Complete by: As directed    Call MD for:  extreme fatigue   Complete by: As directed    Call MD for:  persistant dizziness or light-headedness   Complete by: As directed    Call MD for:  persistant nausea and vomiting   Complete by: As directed    Call MD for:  severe uncontrolled pain   Complete by: As directed    Diet - low  sodium heart healthy   Complete by: As directed    Discharge instructions   Complete by: As directed    Ms. Malvin Johns were admitted to the hospital for shortness of breath and noted to have significantly elevated blood pressures. You improved with BiPAP and hemodialysis sessions with a new low dry weight. On discharge, please continue dialysis as scheduled. We have also increased your hydralazine to three times daily. Please continue to take all blood pressure medications as prescribed. Please monitor your blood pressure at home. Please follow up with your PCP as soon as possible.  Thank you!   Increase activity slowly   Complete by: As directed       Signed: Harvie Heck, MD 12/11/2019, 1:12 PM   Pager: [phone]

## 2019-12-09 NOTE — Progress Notes (Signed)
Subjective: HD 2 Overnight, no acute events reported.   Madeline Brown was examined and evaluated at bedside this morning. Notes that HD went ok. Had some cramping but otherwise no issues. She notes that was dialyzed to new dry weight and now feels much better.  Discussed discharge planning and to continue dialysis as scheduled. Also discussed taking blood pressure medications regularly, and on discharge, will increase hydralazine to three times daily. Patient expresses understanding.   Objective:  Vital signs in last 24 hours: Vitals:   12/08/19 2115 12/08/19 2126 12/08/19 2149 12/09/19 0612  BP: (!) 153/114  (!) 164/111 (!) 161/105  Pulse: 98  97 91  Resp:   18 14  Temp:  99.1 F (37.3 C) 99.4 F (37.4 C) 99.1 F (37.3 C)  TempSrc:   Oral Oral  SpO2: 96%  99% 95%  Weight:   52.2 kg   Height:   5' (1.524 m)    CBC Latest Ref Rng & Units 12/08/2019 12/07/2019 12/07/2019  WBC 4.0 - 10.5 K/uL 6.7 5.4 -  Hemoglobin 12.0 - 15.0 g/dL 10.1(L) 11.3(L) 11.9(L)  Hematocrit 36 - 46 % 30.9(L) 35.1(L) 35.0(L)  Platelets 150 - 400 K/uL 99(L) 109(L) -   BMP Latest Ref Rng & Units 12/08/2019 12/07/2019 12/07/2019  Glucose 70 - 99 mg/dL 94 161(H) 161(H)  BUN 6 - 20 mg/dL 44(H) 33(H) 37(H)  Creatinine 0.44 - 1.00 mg/dL 7.27(H) 6.23(H) 6.70(H)  BUN/Creat Ratio 9 - 23 - - -  Sodium 135 - 145 mmol/L 133(L) 138 139  Potassium 3.5 - 5.1 mmol/L 4.1 3.8 3.7  Chloride 98 - 111 mmol/L 96(L) 99 100  CO2 22 - 32 mmol/L 21(L) 23 -  Calcium 8.9 - 10.3 mg/dL 9.5 9.3 -   Physical Exam  Constitutional: Appears well-developed and well-nourished. No distress.  HENT:  Normocephalic and atraumatic. EOMI, MMM, Conjunctivae are normal.  Cardiovascular: RRR, S1 and S2 present, no murmurs/rubs/gallops Respiratory: Effort normal and breath sounds normal. On room air, no wheezing, rales or rhonchi GI: Soft.No distension. There is no tenderness.  Musculoskeletal: No bilateral lower extremity edema noted    Neurological: Is alert and oriented x4, no apparent focal neurologic deficits.  Skin: Warm and dry.   Assessment/Plan:  Principal Problem:   Hypertensive urgency Active Problems:   ESRD (end stage renal disease) ([place])   Intracranial hematoma (H[facility])   Acute respiratory failure with hypoxia [facility]) Ms. Bee Marchiano is a 44 year old female with PMHx of ESRD on HD TThS, hypertension and ICH with related seizures admitted for hypertensive emergency.   Hypertensive emergency: Patient with acute respiratory distress secondary to pulmonary edema in setting of severe hypertension improved with BiPAP and volume removal via serial HD sessions. Patient resumed on home antihypertensives. SBP 150-160s currently. Patient asymptomatic.  - Continue amlodipine 10mg  daily, carvedilol 25mg  bid and spironolactone 50mg  daily - Increase hydralazine to 100mg  tid - Continue to monitor BP at home and follow up with PCP - Continue HD per schedule   ESRD on HD: Patient with history of ESRD on HD TThS. Had serial HD sessions during admission with new EDW of ~52kg. Renal function currently stable.  - Resume TThS schedule on discharge  - Continue to monitor renal function - Avoid nephrotoxic agents   FEN/GI: Diet: Renal  Fluids: None Electrolytes: Monitor and replete prn  DVT Prophylaxis: SCDs Code status: FULL   Prior to Admission Living Arrangement: Home Anticipated Discharge Location: Home  Barriers to Discharge: None Dispo: Anticipated  discharge today.  Harvie Heck, MD  Internal Medicine, PGY-1 12/09/2019, 7:04 AM Pager: [phone] After 5pm on weekdays and 1pm on weekends: On Call Pager: [phone]

## 2019-12-09 NOTE — Plan of Care (Signed)
  Problem: Education: Goal: Knowledge of General Education information will improve Description: Including pain rating scale, medication(s)/side effects and non-pharmacologic comfort measures 12/09/2019 0857 by Audie Box, RN Outcome: Progressing 12/09/2019 0856 by Audie Box, RN Outcome: Progressing   Problem: Health Behavior/Discharge Planning: Goal: Ability to manage health-related needs will improve 12/09/2019 0857 by Audie Box, RN Outcome: Progressing 12/09/2019 0856 by Audie Box, RN Outcome: Progressing   Problem: Clinical Measurements: Goal: Ability to maintain clinical measurements within normal limits will improve 12/09/2019 0857 by Audie Box, RN Outcome: Progressing 12/09/2019 0856 by Audie Box, RN Outcome: Progressing Goal: Will remain free from infection 12/09/2019 0857 by Audie Box, RN Outcome: Progressing 12/09/2019 0856 by Audie Box, RN Outcome: Progressing Goal: Diagnostic test results will improve 12/09/2019 0857 by Audie Box, RN Outcome: Progressing 12/09/2019 0856 by Audie Box, RN Outcome: Progressing Goal: Respiratory complications will improve 12/09/2019 0857 by Audie Box, RN Outcome: Progressing 12/09/2019 0856 by Audie Box, RN Outcome: Progressing Goal: Cardiovascular complication will be avoided 12/09/2019 0857 by Audie Box, RN Outcome: Progressing 12/09/2019 0856 by Audie Box, RN Outcome: Progressing   Problem: Activity: Goal: Risk for activity intolerance will decrease 12/09/2019 0857 by Audie Box, RN Outcome: Progressing 12/09/2019 0856 by Audie Box, RN Outcome: Progressing   Problem: Coping: Goal: Level of anxiety will decrease 12/09/2019 0857 by Audie Box, RN Outcome: Progressing 12/09/2019 0856 by Audie Box, RN Outcome: Progressing   Problem: Nutrition: Goal: Adequate nutrition will be  maintained 12/09/2019 0857 by Audie Box, RN Outcome: Progressing 12/09/2019 0856 by Audie Box, RN Outcome: Progressing   Problem: Elimination: Goal: Will not experience complications related to bowel motility 12/09/2019 0857 by Audie Box, RN Outcome: Progressing 12/09/2019 0856 by Audie Box, RN Outcome: Progressing Goal: Will not experience complications related to urinary retention 12/09/2019 0857 by Audie Box, RN Outcome: Progressing 12/09/2019 0856 by Audie Box, RN Outcome: Progressing   Problem: Pain Managment: Goal: General experience of comfort will improve 12/09/2019 0857 by Audie Box, RN Outcome: Progressing 12/09/2019 0856 by Audie Box, RN Outcome: Progressing   Problem: Safety: Goal: Ability to remain free from injury will improve 12/09/2019 0857 by Audie Box, RN Outcome: Progressing 12/09/2019 0856 by Audie Box, RN Outcome: Progressing   Problem: Skin Integrity: Goal: Risk for impaired skin integrity will decrease 12/09/2019 0857 by Audie Box, RN Outcome: Progressing 12/09/2019 0856 by Audie Box, RN Outcome: Progressing

## 2019-12-10 ENCOUNTER — Telehealth (HOSPITAL_COMMUNITY): Payer: Self-pay | Admitting: Nephrology

## 2019-12-10 NOTE — Telephone Encounter (Signed)
Transition of care contact from inpatient facility  Date of Discharge: 12/09/2019 Date of Contact: 12/10/2019 --- attempted Method of contact: Phone  Attempted to contact patient to discuss transition of care from inpatient admission. Patient did not answer the phone, no ability to leave a message. Will try to call her again tomorrow.  Veneta Penton, PA-C [employer] Pager [phone]

## 2019-12-11 ENCOUNTER — Telehealth: Payer: Self-pay | Admitting: Nephrology

## 2019-12-11 NOTE — Telephone Encounter (Signed)
Transition of Care Contact from  [place]  Date of Discharge: 12/09/19 Date of Contact: 12/11/19 - attempted Method of contact: phone  Attempted to contact patient to discuss transition of care from inpatient admission. Patient did not answer the phone.  Unable to leave a message.  Will attempt to call again tomorrow and/or follow up with patient at outpatient dialysis center.   Jen Mow, PA-C [facility] Pager: [phone]

## 2019-12-12 ENCOUNTER — Telehealth: Payer: Self-pay | Admitting: Nephrology

## 2019-12-12 NOTE — Telephone Encounter (Signed)
Transition of care contact from inpatient facility  Date of discharge: 12/09/19 Date of contact: 12/12/19 Method: Phone Spoke to: Patient  Patient contacted to discuss transition of care from recent inpatient hospitalization. Patient was admitted to [facility] from 6/23 -12/09/19 with discharge diagnosis of hypertensive emergency.   Medication changes were reviewed. Patient aware of medication changes.   Patient will follow up with his/her outpatient HD unit on: 12/13/19. She has no concerns today.

## 2019-12-26 ENCOUNTER — Ambulatory Visit: Payer: Medicare Other

## 2019-12-28 ENCOUNTER — Ambulatory Visit: Payer: Medicare Other

## 2020-01-04 ENCOUNTER — Ambulatory Visit: Payer: Medicare Other

## 2020-04-11 ENCOUNTER — Ambulatory Visit: Payer: Medicare HMO | Admitting: Adult Health

## 2020-05-21 ENCOUNTER — Ambulatory Visit: Payer: Medicare HMO | Admitting: Adult Health

## 2020-05-21 ENCOUNTER — Encounter: Payer: Self-pay | Admitting: Adult Health

## 2020-05-21 NOTE — Progress Notes (Deleted)
[facility] [address] [place]. [place] [address] [phone]       STROKE FOLLOW UP NOTE  [name] Date of Birth:  Apr 08, 1976 Medical Record Number:  [id number]   Reason for Referral: Left ICH follow up    CHIEF COMPLAINT:  No chief complaint on file.   HPI:   Today, 05/21/2020, Madeline Brown returns for stroke and seizure follow-up.  Previously seen 09/2019.       History provided for reference purposes only Update 10/10/2019 JM: Madeline Brown returns for follow-up regarding left ICH with subsequent seizure activity in 02/2019 after multiple prior no-show visits.  She has been stable since prior visit without residual stroke deficits or new/reoccurring stroke/TIA symptoms.  Recommended repeating MRI for possible underlying ICH etiologies with order placed but not completed at this time. Continues on Keppra 1000 mg daily tolerating well with questionable breakthrough seizure 1 week ago with transient speech difficulty lasting approximately 5 to 10 minutes and then resolved after sitting with eyes closed taking deep breaths.  She does report increased stressful event prior and symptoms similar to prior seizure-like activity.  No prior breakthrough seizure.  Continues on atorvastatin for secondary stroke prevention.  Blood pressure today 152/102. She does not routinely monitor at home but again, plans on obtaining cuff to monitor at home. Monitors at HD and range 140-160/103-110 prior to HD and improve after HD.  Continues to receive HD and currently in the process of finding a living donor for kidney transplant.  No concerns at this time.  Initial visit 03/23/2019 JM: Madeline Brown is being seen today for hospital follow-up.  She has been stable since discharge without reoccurring or new stroke/TIA symptoms.  She has continued on Keppra 1000 mg daily without recurrent seizure activity.  She is questioning need of continuation of Keppra at this time.  She has  continued on atorvastatin without side effects or myalgias.  She is questioning need of continuation statin therapy.  Blood pressure today elevated at 162/98, asymptomatic.  She has not taken her a.m. antihypertensives as she will usually take them around 10 AM (currently 8 AM) due to dialysis.  She does not currently monitor at home but is planning on obtaining cuff this Friday to monitor at home.  She does have follow-up with PCP on 04/02/2019.  Of note, she does report her mother passed away from a ruptured aneurysm and also had history of seizures.  No further concerns at this time.   Stroke admission 02/20/2019: Madeline Brown is a 44 y.o. female with history of ESRD s/p HD who presented to [facility] on 02/20/2019 after having 1 min episode of whole body shaking and frothing at the mouth once home after HD. She has no hx seizure. She was brought to the ED. EEG showed left parietal occipital sharp waves and left posterior quadrant activity and initiated Keppra 1000 mg daily.  CT head showed 2.1 m parietal mass with medium density rim and vasogenic edema.  MRI showed 20 x 13 mm left parietal mass with subacute hemorrhage and mild vasogenic edema estimated 11 to 32 weeks old likely secondary to uncontrolled HTN.  MRI showed torturous right ICA without vascular lesion and left VA stenosis/occlusion.  Carotid Dopplers unremarkable.  2D echo previously performed 10/2018 with normal EF and RA & LA dilated.  LDL 138.  A1c 4.3.  UDS positive for THC.  No antithrombotic therapy recommended due to hemorrhage.  Recommended repeat MRI w/wo outpatient to assess for underlying  source of hemorrhage.  HTN stable and continued amlodipine, Coreg and spironolactone. Initiated atorvastatin 20 mg daily for HLD management.  No prior history or evidence of DM.  Current tobacco user smoking cessation counseling provided.  Other stroke risk factors include EtOH use and THC use.  Discharged home with recommendations of home health PT.  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 No hemorrhage.      MRI  20x13 mm L parietal mass w/ subacute hemorrhage and mild vasogenic edema.   MRA  Tortuous R ICA. No vascular lesion. L VA stenosis/occlusion   Carotid Doppler  B ICA 1-39% stenosis, VAs antegrade   2D Echo 10/2018 EF >65%. RA & LA dilated.  LDL 138 -initiated atorvastatin  HgbA1c 4.3  UDS positive for THC  SCDs for VTE prophylaxis  No antithrombotic prior to admission, now on No antithrombotic given hemorrhage. Follow up at [facility] to consider antiplatelet at that time  Therapy recommendations:  HH PT  Disposition: home  Repeat MRI with and without contrast in 1 month to look for underlying source of hemorrhage at outpt follow up  Seziure   No hx sz  Witnessed seizure at home post HD  EEG 1)left parieto-occiptalsharp waves 2)left posterior quadrant slow activity  On Keppra 1000 daily  Continue keppra on d/c  Follow up with [facility]   No driving unless 6 months free of seizure     ROS:   14 system review of systems performed and negative with exception of no concerns  PMH:  Past Medical History:  Diagnosis Date  . Anemia   . Chronic kidney disease   . Hypertension     PSH:  Past Surgical History:  Procedure Laterality Date  . AV FISTULA PLACEMENT Left 06/24/2017   Procedure: CREATION OF LEFT ARM BRACHIALCEPHALIC  ARTERIOVENOUS (AV) FISTULA;  Surgeon: Rosetta Posner, MD;  Location: [facility];  Service: Vascular;  Laterality: Left;  . CESAREAN SECTION     x2  . FISTULA SUPERFICIALIZATION Left 10/28/2017   Procedure: SUPERFICIALIZATION LEFT BRACHIOCEPHALIC ARTERIOVENOUS FISTULA;  Surgeon: Rosetta Posner, MD;  Location: [facility];  Service: Vascular;  Laterality: Left;  . INSERTION OF DIALYSIS CATHETER Right 06/24/2017   Procedure: INSERTION OF TUNNELED DIALYSIS CATHETER;  Surgeon: Rosetta Posner, MD;   Location: [facility];  Service: Vascular;  Laterality: Right;  . LIGATION OF COMPETING BRANCHES OF ARTERIOVENOUS FISTULA  10/28/2017   Procedure: LIGATION OF COMPETING BRANCHES OF ARTERIOVENOUS FISTULA x3;  Surgeon: Rosetta Posner, MD;  Location: [facility];  Service: Vascular;;    Social History:  Social History   Socioeconomic History  . Marital status: Single    Spouse name: Not on file  . Number of children: Not on file  . Years of education: Not on file  . Highest education level: Not on file  Occupational History  . Not on file  Tobacco Use  . Smoking status: Current Every Day Smoker    Packs/day: 0.25    Years: 15.00    Pack years: 3.75    Types: Cigarettes  . Smokeless tobacco: Never Used  . Tobacco comment: 4 cigarettes per day  Vaping Use  . Vaping Use: Never used  Substance and Sexual Activity  . Alcohol use: Yes    Comment: occassional beer  . Drug use: Yes    Types: Marijuana  . Sexual  activity: Yes    Birth control/protection: None  Other Topics Concern  . Not on file  Social History Narrative  . Not on file   Social Determinants of Health   Financial Resource Strain:   . Difficulty of Paying Living Expenses: Not on file  Food Insecurity:   . Worried About Charity fundraiser in the Last Year: Not on file  . Ran Out of Food in the Last Year: Not on file  Transportation Needs:   . Lack of Transportation (Medical): Not on file  . Lack of Transportation (Non-Medical): Not on file  Physical Activity:   . Days of Exercise per Week: Not on file  . Minutes of Exercise per Session: Not on file  Stress:   . Feeling of Stress : Not on file  Social Connections:   . Frequency of Communication with Friends and Family: Not on file  . Frequency of Social Gatherings with Friends and Family: Not on file  . Attends Religious Services: Not on file  . Active Member of Clubs or Organizations: Not on file  . Attends Archivist Meetings: Not on file  . Marital Status:  Not on file  Intimate Partner Violence:   . Fear of Current or Ex-Partner: Not on file  . Emotionally Abused: Not on file  . Physically Abused: Not on file  . Sexually Abused: Not on file    Family History:  Family History  Problem Relation Age of Onset  . Diabetes Father   . Hypertension Father   . Cancer Father   . Heart disease Father   . Aneurysm Mother   . Seizures Mother     Medications:   Current Outpatient Medications on File Prior to Visit  Medication Sig Dispense Refill  . amLODipine (NORVASC) 10 MG tablet Take 1 tablet (10 mg total) by mouth at bedtime. 30 tablet 0  . atorvastatin (LIPITOR) 20 MG tablet Take 1 tablet (20 mg total) by mouth daily at 6 PM. 30 tablet 3  . B Complex-C-Folic Acid (DIALYVITE TABLET) TABS Take 1 tablet by mouth daily.    . carvedilol (COREG) 25 MG tablet Take 1 tablet (25 mg total) by mouth 2 (two) times daily with a meal. 60 tablet 0  . hydrALAZINE (APRESOLINE) 100 MG tablet Take 1 tablet (100 mg total) by mouth 3 (three) times daily. 90 tablet 0  . levETIRAcetam 1000 MG TB24 Take 1,000 mg by mouth daily. 90 tablet 3  . lidocaine-prilocaine (EMLA) cream Apply 1 application topically See admin instructions. Apply small amount to access site one hour before dialysis (Tuesday, Thursday, Saturday). Cover with occlusive dressing (Saran wrap)    . multivitamin (RENA-VIT) TABS tablet Take 1 tablet by mouth at bedtime. (Patient taking differently: Take 1 tablet by mouth daily. ) 30 tablet 0  . sevelamer carbonate (RENVELA) 800 MG tablet Take 2 tablets (1,600 mg total) by mouth 3 (three) times daily with meals. 30 tablet 0  . spironolactone (ALDACTONE) 50 MG tablet Take 1 tablet (50 mg total) by mouth daily. 30 tablet 0   No current facility-administered medications on file prior to visit.    Allergies:   Allergies  Allergen Reactions  . Visine [Tetrahydrozoline Hcl] Swelling    Eyes Swelling     Physical Exam  There were no vitals filed for  this visit. There is no height or weight on file to calculate BMI. No exam data present   General: well developed, well nourished, very pleasant middle-aged  African-American female, seated, in no evident distress Head: head normocephalic and atraumatic.   Neck: supple with no carotid or supraclavicular bruits; Cardiovascular: regular rate and rhythm, no murmurs; L arm fistula +bruit, +thrill Musculoskeletal: no deformity Skin:  no rash/petichiae Vascular:  Normal pulses all extremities   Neurologic Exam Mental Status: Awake and fully alert.  Fluent speech and language. Oriented to place and time. Recent and remote memory intact. Attention span, concentration and fund of knowledge appropriate. Mood and affect appropriate.  Cranial Nerves: Pupils equal, briskly reactive to light. Extraocular movements full without nystagmus. Visual fields full to confrontation. Hearing intact. Facial sensation intact. Face, tongue, palate moves normally and symmetrically.  Motor: Normal bulk and tone. Normal strength in all tested extremity muscles. Sensory.: intact to touch , pinprick , position and vibratory sensation.  Coordination: Rapid alternating movements normal in all extremities. Finger-to-nose and heel-to-shin performed accurately bilaterally. Gait and Station: Arises from chair without difficulty. Stance is normal. Gait demonstrates normal stride length and balance Reflexes: 1+ and symmetric. Toes downgoing.          ASSESSMENT: Caffie Sotto is a 44 y.o. 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 Vascular risk factors include HTN, HLD, tobacco use, EtOH use, THC use and ESRD on HD.  Has been stable from a neurological standpoint without residual deficits or reoccurring/new stroke/TIA symptoms.  Questionable breakthrough seizure 1 week prior after stressful  event otherwise no additional seizure activity and ongoing compliance of Keppra 1000 mg daily.    PLAN:  1. ICH likely HTN etiology:  a. Repeat MRI wo contrast due to end-stage renal function to assess for potential underlying etiology as recommended at hospital discharge.  Order previously placed but not completed for unknown reasons.  Avoid aspirin, aspirin-containing products and ibuprofen products due to [place].   b. No indication for antithrombotic as no prior history of stroke, CAD or stents.   c. Continue Lipitor 20 mg daily for secondary stroke prevention.  d. Discussed secondary stroke prevention measures and close PCP follow-up for aggressive stroke risk factor management 2. Seizure, post ICH: Continuation of Keppra 24hr tab 1000 mg daily - refill provided.  Discussion regarding avoidance of seizure provoking triggers or activities and is aware to call office with any question of seizure activity or symptoms.  3. HTN: BP goal<130/90.  Stable on spironolactone, hydralazine, carvedilol and amlodipine per PCP 4. HLD: LDL<70.  On atorvastatin 20 mg daily per PCP    Follow up in 6 months or call earlier if needed   I spent 26 minutes of face-to-face and non-face-to-face time with patient.  This included previsit chart review, lab review, study review, order entry, electronic health record documentation, patient education     Frann Rider, [facility]  [facility] [address] [place], [place] [address]  Phone [phone] Fax [phone] Note: This document was prepared with digital dictation and possible smart phrase technology. Any transcriptional errors that result from this process are unintentional.

## 2020-05-23 ENCOUNTER — Other Ambulatory Visit: Payer: Self-pay

## 2020-05-23 ENCOUNTER — Other Ambulatory Visit: Payer: Self-pay | Admitting: Family

## 2020-05-23 DIAGNOSIS — Z0000 Encounter for general adult medical examination without abnormal findings: Secondary | ICD-10-CM

## 2020-07-03 NOTE — Progress Notes (Signed)
Cardiology Office Note   Date:  07/12/2020   ID:  Madeline Brown, DOB January 28, 1976, MRN [id number]  PCP:  Sonia Side., FNP  Cardiologist:   Jenkins Rouge, MD   No chief complaint on file.     History of Present Illness: Madeline Brown is a 45 y.o. female who presents for post hospital f/u pericardial effusion. Last seen by me in August 2018 for elevated troponin in setting of CRF and anemia. Seen 07/12/18 by Dr Debara Pickett. Patient ESRD now on dialysis , Smoker , Low Platelets Has been on minoxidil for HTN and TTE with small to moderate pericardial effusion.   TTE 07/12/18 EF 60-65% severe LVH Estimated PA pressure 54 mmHg  Small to moderate circumferential effusion no tamponade  She had high oxygen requirements CTA negative for PE  To f/u Dr Nelda Marseille auto immune studies negative Needs sleep study  She continues to have issues with arranging transportation to dialysis  She sees [facility] for her access which seems very large in LUE  F/U TTE 12/08/19 showed resolution of effusion with normal EF and no significant valve dx  Hospitalized 12/07/19 with HTN emergency ICH and seizures BP meds adjusted and started on Keppra She was non compliant with her meds and presented with pulmonary edema   She is being followed for transplant at [facility] Had stress test in October that was fine Needs mamogram Has 5 kids two olderst 27,29 can help and she has better support system  I am still concerned about her huge fistula She does not seem to get good f/u with [facility] I think her shunt is too big and will lead to high output CHF   Past Medical History:  Diagnosis Date  . Anemia   . Chronic kidney disease   . Hypertension     Past Surgical History:  Procedure Laterality Date  . AV FISTULA PLACEMENT Left 06/24/2017   Procedure: CREATION OF LEFT ARM BRACHIALCEPHALIC  ARTERIOVENOUS (AV) FISTULA;  Surgeon: Rosetta Posner, MD;  Location: [place];  Service: Vascular;  Laterality: Left;  . CESAREAN  SECTION     x2  . FISTULA SUPERFICIALIZATION Left 10/28/2017   Procedure: SUPERFICIALIZATION LEFT BRACHIOCEPHALIC ARTERIOVENOUS FISTULA;  Surgeon: Rosetta Posner, MD;  Location: [place];  Service: Vascular;  Laterality: Left;  . INSERTION OF DIALYSIS CATHETER Right 06/24/2017   Procedure: INSERTION OF TUNNELED DIALYSIS CATHETER;  Surgeon: Rosetta Posner, MD;  Location: [place];  Service: Vascular;  Laterality: Right;  . LIGATION OF COMPETING BRANCHES OF ARTERIOVENOUS FISTULA  10/28/2017   Procedure: LIGATION OF COMPETING BRANCHES OF ARTERIOVENOUS FISTULA x3;  Surgeon: Rosetta Posner, MD;  Location: [facility];  Service: Vascular;;     Current Outpatient Medications  Medication Sig Dispense Refill  . amLODipine (NORVASC) 10 MG tablet Take 1 tablet (10 mg total) by mouth at bedtime. 30 tablet 0  . B Complex-C-Folic Acid (DIALYVITE TABLET) TABS Take 1 tablet by mouth daily.    . carvedilol (COREG) 25 MG tablet Take 1 tablet (25 mg total) by mouth 2 (two) times daily with a meal. 60 tablet 0  . hydrALAZINE (APRESOLINE) 100 MG tablet Take 1 tablet (100 mg total) by mouth 3 (three) times daily. 90 tablet 0  . levETIRAcetam 1000 MG TB24 Take 1,000 mg by mouth daily. 90 tablet 3  . lidocaine-prilocaine (EMLA) cream Apply 1 application topically See admin instructions. Apply small amount to access site one hour before dialysis (Tuesday, Thursday, Saturday). Cover  with occlusive dressing (Saran wrap)    . multivitamin (RENA-VIT) TABS tablet Take 1 tablet by mouth at bedtime. (Patient taking differently: Take 1 tablet by mouth daily.) 30 tablet 0  . sevelamer carbonate (RENVELA) 800 MG tablet Take 2 tablets (1,600 mg total) by mouth 3 (three) times daily with meals. 30 tablet 0  . spironolactone (ALDACTONE) 50 MG tablet Take 1 tablet (50 mg total) by mouth daily. 30 tablet 0   No current facility-administered medications for this visit.    Allergies:   Visine [tetrahydrozoline hcl]    Social History:  The patient   reports that she has been smoking cigarettes. She has a 3.75 pack-year smoking history. She has never used smokeless tobacco. She reports current alcohol use. She reports current drug use. Drug: Marijuana.   Family History:  The patient's family history includes Aneurysm in her mother; Cancer in her father; Diabetes in her father; Heart disease in her father; Hypertension in her father; Seizures in her mother.    ROS:  Please see the history of present illness.   Otherwise, review of systems are positive for none.   All other systems are reviewed and negative.    PHYSICAL EXAM: VS:  BP 114/72   Pulse 86   Ht 5' (1.524 m)   Wt 57 kg   SpO2 93%   BMI 24.53 kg/m  , BMI Body mass index is 24.53 kg/m. Affect appropriate Chronically ill black female  HEENT: normal Neck supple with no adenopathy JVP normal no bruits no thyromegaly Lungs clear with no wheezing and good diaphragmatic motion Heart:  S1/S2 continuous shunt murmur, no rub, gallop or click PMI normal Abdomen: benighn, BS positve, no tenderness, no AAA no bruit.  No HSM or HJR Distal pulses intact with no bruits No edema Neuro non-focal Skin warm and dry No muscular weakness LUE fistula is gigantic especially for her size loud thrills    EKG:  07/12/18 SR LAE LVH low voltage    Recent Labs: 12/07/2019: Magnesium 2.5 12/08/2019: Hemoglobin 10.1; Platelets 99 12/09/2019: BUN 38; Creatinine, Ser 5.91; Potassium 3.8; Sodium 135    Lipid Panel    Component Value Date/Time   CHOL 172 03/23/2019 0839   TRIG 55 03/23/2019 0839   HDL 93 03/23/2019 0839   CHOLHDL 1.8 03/23/2019 0839   CHOLHDL 2.7 02/21/2019 0425   VLDL 10 02/21/2019 0425   LDLCALC 68 03/23/2019 0839      Wt Readings from Last 3 Encounters:  07/12/20 57 kg  12/08/19 52.2 kg  10/10/19 59.9 kg      Other studies Reviewed: Additional studies/ records that were reviewed today include: Notes from hospitalization January 2020 [facility] consult Note labs  CTA, CXR and TTE .    ASSESSMENT AND PLAN:  1.  Pericardial effusion: likely related to uremia. Resolved on f/u echo 12/08/19  2.  HTN:  Well controlled.  Continue current medications and  low sodium Dash type diet.   3  CRF:  Continue dialysis has had issues with fistula Seems too large to me with large shunt Causing continuous murmur in heart f/ [facility] is not ideal Will refer her to our [facility] surgeons for another opinion but I fear that this fistula contributes and will cause High output CHF  4. Pulmonary HTN:  F/u pulmonary needs sleep study should be improved with lower volume  5. ICH: previous stable on Keppra for seizure prophylaxis    Current medicines are reviewed at length with the  patient today.  The patient does not have concerns regarding medicines.  The following changes have been made:  no change  Labs/ tests ordered today include: None   Orders Placed This Encounter  Procedures  . Ambulatory referral to Vascular Surgery     Disposition:   FU with cardiology year Refer to [facility]VS     Signed, Jenkins Rouge, MD  07/12/2020 4:15 PM    [facility] [address], [place], [place]  [address] Phone: [phone]; Fax: [phone]

## 2020-07-12 ENCOUNTER — Other Ambulatory Visit: Payer: Self-pay

## 2020-07-12 ENCOUNTER — Ambulatory Visit (INDEPENDENT_AMBULATORY_CARE_PROVIDER_SITE_OTHER): Payer: Medicare HMO | Admitting: Cardiovascular Disease

## 2020-07-12 ENCOUNTER — Encounter: Payer: Self-pay | Admitting: Cardiovascular Disease

## 2020-07-12 VITALS — BP 114/72 | HR 86 | Ht 60.0 in | Wt 125.6 lb

## 2020-07-12 DIAGNOSIS — I313 Pericardial effusion (noninflammatory): Secondary | ICD-10-CM

## 2020-07-12 DIAGNOSIS — I10 Essential (primary) hypertension: Secondary | ICD-10-CM | POA: Diagnosis not present

## 2020-07-12 DIAGNOSIS — T829XXS Unspecified complication of cardiac and vascular prosthetic device, implant and graft, sequela: Secondary | ICD-10-CM

## 2020-07-12 DIAGNOSIS — I3139 Other pericardial effusion (noninflammatory): Secondary | ICD-10-CM

## 2020-07-12 NOTE — Patient Instructions (Signed)
Medication Instructions:  *If you need a refill on your cardiac medications before your next appointment, please call your pharmacy*  Lab Work: If you have labs (blood work) drawn today and your tests are completely normal, you will receive your results only by: Marland Kitchen MyChart Message (if you have MyChart) OR . A paper copy in the mail If you have any lab test that is abnormal or we need to change your treatment, we will call you to review the results.  Follow-Up: At [facility], you and your health needs are our priority.  As part of our continuing mission to provide you with exceptional heart care, we have created designated Provider Care Teams.  These Care Teams include your primary Cardiologist (physician) and Advanced Practice Providers (APPs -  Physician Assistants and Nurse Practitioners) who all work together to provide you with the care you need, when you need it.  We recommend signing up for the patient portal called "MyChart".  Sign up information is provided on this After Visit Summary.  MyChart is used to connect with patients for Virtual Visits (Telemedicine).  Patients are able to view lab/test results, encounter notes, upcoming appointments, etc.  Non-urgent messages can be sent to your provider as well.   To learn more about what you can do with MyChart, go to [email or web address].    Your next appointment:   1 year  The format for your next appointment:   In Person  Provider:   You may see Jenkins Rouge, MD or one of the following Advanced Practice Providers on your designated Care Team:    Kathyrn Drown, NP    Other Instructions You have been referred to [facility].

## 2020-07-17 ENCOUNTER — Other Ambulatory Visit: Payer: Self-pay

## 2020-07-17 DIAGNOSIS — Z9289 Personal history of other medical treatment: Secondary | ICD-10-CM

## 2020-07-17 DIAGNOSIS — I770 Arteriovenous fistula, acquired: Secondary | ICD-10-CM

## 2020-07-17 HISTORY — DX: Personal history of other medical treatment: Z92.89

## 2020-07-18 ENCOUNTER — Other Ambulatory Visit: Payer: Self-pay

## 2020-07-18 ENCOUNTER — Ambulatory Visit (INDEPENDENT_AMBULATORY_CARE_PROVIDER_SITE_OTHER): Payer: Medicare HMO | Admitting: Physician Assistant

## 2020-07-18 ENCOUNTER — Ambulatory Visit (HOSPITAL_COMMUNITY)
Admission: RE | Admit: 2020-07-18 | Discharge: 2020-07-18 | Disposition: A | Discharge status: Home / Self Care | Payer: Medicare HMO | Source: Ambulatory Visit | Attending: Physician Assistant | Admitting: Physician Assistant

## 2020-07-18 VITALS — BP 137/88 | HR 77 | Temp 97.5°F | Resp 14 | Ht 60.0 in | Wt 128.0 lb

## 2020-07-18 DIAGNOSIS — I770 Arteriovenous fistula, acquired: Secondary | ICD-10-CM

## 2020-07-18 DIAGNOSIS — Z992 Dependence on renal dialysis: Secondary | ICD-10-CM | POA: Diagnosis not present

## 2020-07-18 DIAGNOSIS — N186 End stage renal disease: Secondary | ICD-10-CM

## 2020-07-18 NOTE — Progress Notes (Signed)
Established Dialysis Access   History of Present Illness   Madeline Brown is a 45 y.o. (17-Jul-1975) female who presents for evaluation of left upper extremity AV fistula with high output.She has a left brachiocephalic AV fistula that was placed by Dr. Donnetta Hutching in January of 2019 with subsequent superficialization in May of 2019. She was last seen in follow up by Dr. Donnetta Hutching in June of 2019. The fistula has been working well since. She was recently seen by her cardiologist, Dr. Johnsie Cancel, who has concerns about the high flows contributing to continuous heart murmur and development of high output CHF  She reports that the fistula has been working well. She does say sometimes she has some soreness along the upper arm after dialysis but reports no bleeding. She does feel that the fistula has continued to increase in size over the past year. She does not have any steal symptoms  Current Outpatient Medications  Medication Sig Dispense Refill  . B Complex-C-Folic Acid (DIALYVITE TABLET) TABS Take 1 tablet by mouth daily.    Marland Kitchen levETIRAcetam 1000 MG TB24 Take 1,000 mg by mouth daily. 90 tablet 3  . lidocaine-prilocaine (EMLA) cream Apply 1 application topically See admin instructions. Apply small amount to access site one hour before dialysis (Tuesday, Thursday, Saturday). Cover with occlusive dressing (Saran wrap)    . multivitamin (RENA-VIT) TABS tablet Take 1 tablet by mouth at bedtime. (Patient taking differently: Take 1 tablet by mouth daily.) 30 tablet 0  . sevelamer carbonate (RENVELA) 800 MG tablet Take 2 tablets (1,600 mg total) by mouth 3 (three) times daily with meals. 30 tablet 0  . spironolactone (ALDACTONE) 50 MG tablet Take 1 tablet (50 mg total) by mouth daily. 30 tablet 0  . amLODipine (NORVASC) 10 MG tablet Take 1 tablet (10 mg total) by mouth at bedtime. 30 tablet 0  . carvedilol (COREG) 25 MG tablet Take 1 tablet (25 mg total) by mouth 2 (two) times daily with a meal. 60 tablet 0  .  hydrALAZINE (APRESOLINE) 100 MG tablet Take 1 tablet (100 mg total) by mouth 3 (three) times daily. 90 tablet 0   No current facility-administered medications for this visit.    On ROS today: negative unless stated in HPI   Physical Examination   Vitals:   07/18/20 1247  BP: 137/88  Pulse: 77  Resp: 14  Temp: (!) 97.5 F (36.4 C)  TempSrc: Temporal  SpO2: 90%  Weight: 128 lb (58.1 kg)  Height: 5' (1.524 m)   Body mass index is 25 kg/m.  General Well appearing, well nourished, in no distress  Pulmonary Non labored  Cardiac Regular rate and rhythm  Vascular Vessel Right Left  Radial Palpable Palpable  Brachial Palpable Palpable  Ulnar Faintly palpable Faintly palpable    Left AV fistula aneurysmal throughout. Some pulsatility in the mid fistula. Good thrill. There are also two areas of hypopigmentation but not scabbing. Skin is easily moveable overlying these areas  Musculo- skeletal M/S 5/5 throughout  , Extremities without ischemic changes    Neurologic A&O; CN grossly intact     Non-invasive Vascular Imaging   left Arm Access Duplex  (07/18/20):  Findings:  +--------------------+----------+-----------------+--------+  AVF         PSV (cm/s)Flow Vol (mL/min)Comments  +--------------------+----------+-----------------+--------+  Native artery inflow  223     4028          +--------------------+----------+-----------------+--------+  AVF Anastomosis     303                 +--------------------+----------+-----------------+--------+     +------------+----------+-------------+----------+--------+  OUTFLOW VEINPSV (cm/s)Diameter (cm)Depth (cm)Describe  +------------+----------+-------------+----------+--------+  Shoulder    125    1.62     0.29        +------------+----------+-------------+----------+--------+  Prox UA     199    1.81     0.19         +------------+----------+-------------+----------+--------+  Mid UA     57    3.47     0.24        +------------+----------+-------------+----------+--------+  Dist UA    54 / 104   2.89     0.17        +------------+----------+-------------+----------+--------+  AC Fossa    151    2.21     0.18        +------------+----------+-------------+----------+--------+       Medical Decision Making   Madeline Brown is a 45 y.o. female who presents with high output from her left brachiocephalic AV fistula. The fistula was initially placed in 2019 by Dr. Donnetta Hutching. It has been functioning well since. The fistula is aneurysmal throughout which is contributing to her high flow volumes. Her Cardiologist is concerned that this will lead to high output CHF. There is no stenosis on duplex evaluation today. She does have some hypopigmented areas but they presently are not at risk for bleeding or rupture with no ulceration or scabbing. I discussed with patient that she will likely need banding or revision of her fistula vs placement of a new fistula in her right upper extremity. I will discuss with her surgeon, Dr. Donnetta Hutching and will provide appropriate follow up with the patient  Karoline Caldwell, PA-C [facility] Office: [phone]  Clinic MD: MD on call Dr. Trula Slade

## 2020-07-18 NOTE — H&P (View-Only) (Signed)
Established Dialysis Access   History of Present Illness   Madeline Brown is a 45 y.o. (08-27-1975) female who presents for evaluation of left upper extremity AV fistula with high output.She has a left brachiocephalic AV fistula that was placed by Dr. Donnetta Hutching in January of 2019 with subsequent superficialization in May of 2019. She was last seen in follow up by Dr. Donnetta Hutching in June of 2019. The fistula has been working well since. She was recently seen by her cardiologist, Dr. Johnsie Cancel, who has concerns about the high flows contributing to continuous heart murmur and development of high output CHF  She reports that the fistula has been working well. She does say sometimes she has some soreness along the upper arm after dialysis but reports no bleeding. She does feel that the fistula has continued to increase in size over the past year. She does not have any steal symptoms  Current Outpatient Medications  Medication Sig Dispense Refill  . B Complex-C-Folic Acid (DIALYVITE TABLET) TABS Take 1 tablet by mouth daily.    Marland Kitchen levETIRAcetam 1000 MG TB24 Take 1,000 mg by mouth daily. 90 tablet 3  . lidocaine-prilocaine (EMLA) cream Apply 1 application topically See admin instructions. Apply small amount to access site one hour before dialysis (Tuesday, Thursday, Saturday). Cover with occlusive dressing (Saran wrap)    . multivitamin (RENA-VIT) TABS tablet Take 1 tablet by mouth at bedtime. (Patient taking differently: Take 1 tablet by mouth daily.) 30 tablet 0  . sevelamer carbonate (RENVELA) 800 MG tablet Take 2 tablets (1,600 mg total) by mouth 3 (three) times daily with meals. 30 tablet 0  . spironolactone (ALDACTONE) 50 MG tablet Take 1 tablet (50 mg total) by mouth daily. 30 tablet 0  . amLODipine (NORVASC) 10 MG tablet Take 1 tablet (10 mg total) by mouth at bedtime. 30 tablet 0  . carvedilol (COREG) 25 MG tablet Take 1 tablet (25 mg total) by mouth 2 (two) times daily with a meal. 60 tablet 0  .  hydrALAZINE (APRESOLINE) 100 MG tablet Take 1 tablet (100 mg total) by mouth 3 (three) times daily. 90 tablet 0   No current facility-administered medications for this visit.    On ROS today: negative unless stated in HPI   Physical Examination   Vitals:   07/18/20 1247  BP: 137/88  Pulse: 77  Resp: 14  Temp: (!) 97.5 F (36.4 C)  TempSrc: Temporal  SpO2: 90%  Weight: 128 lb (58.1 kg)  Height: 5' (1.524 m)   Body mass index is 25 kg/m.  General Well appearing, well nourished, in no distress  Pulmonary Non labored  Cardiac Regular rate and rhythm  Vascular Vessel Right Left  Radial Palpable Palpable  Brachial Palpable Palpable  Ulnar Faintly palpable Faintly palpable    Left AV fistula aneurysmal throughout. Some pulsatility in the mid fistula. Good thrill. There are also two areas of hypopigmentation but not scabbing. Skin is easily moveable overlying these areas  Musculo- skeletal M/S 5/5 throughout  , Extremities without ischemic changes    Neurologic A&O; CN grossly intact     Non-invasive Vascular Imaging   left Arm Access Duplex  (07/18/20):  Findings:  +--------------------+----------+-----------------+--------+  AVF         PSV (cm/s)Flow Vol (mL/min)Comments  +--------------------+----------+-----------------+--------+  Native artery inflow  223     4028          +--------------------+----------+-----------------+--------+  AVF Anastomosis     303                 +--------------------+----------+-----------------+--------+     +------------+----------+-------------+----------+--------+  OUTFLOW VEINPSV (cm/s)Diameter (cm)Depth (cm)Describe  +------------+----------+-------------+----------+--------+  Shoulder    125    1.62     0.29        +------------+----------+-------------+----------+--------+  Prox UA     199    1.81     0.19         +------------+----------+-------------+----------+--------+  Mid UA     57    3.47     0.24        +------------+----------+-------------+----------+--------+  Dist UA    54 / 104   2.89     0.17        +------------+----------+-------------+----------+--------+  AC Fossa    151    2.21     0.18        +------------+----------+-------------+----------+--------+       Medical Decision Making   [name] is a 45 y.o. female who presents with high output from her left brachiocephalic AV fistula. The fistula was initially placed in 2019 by Dr. Donnetta Hutching. It has been functioning well since. The fistula is aneurysmal throughout which is contributing to her high flow volumes. Her Cardiologist is concerned that this will lead to high output CHF. There is no stenosis on duplex evaluation today. She does have some hypopigmented areas but they presently are not at risk for bleeding or rupture with no ulceration or scabbing. I discussed with patient that she will likely need banding or revision of her fistula vs placement of a new fistula in her right upper extremity. I will discuss with her surgeon, Dr. Donnetta Hutching and will provide appropriate follow up with the patient  Karoline Caldwell, PA-C [facility] [place] Office: [phone]  Clinic MD: MD on call Dr. Trula Slade

## 2020-07-20 ENCOUNTER — Other Ambulatory Visit: Payer: Self-pay

## 2020-07-20 MED ORDER — SODIUM CHLORIDE 0.9 % IV SOLN: 250.0000 mL | INTRAVENOUS | Status: DC | PRN

## 2020-07-20 MED ORDER — SODIUM CHLORIDE 0.9% FLUSH: 3.0000 mL | Freq: Two times a day (BID) | INTRAVENOUS | Status: DC

## 2020-07-20 MED ORDER — SODIUM CHLORIDE 0.9% FLUSH: 3.0000 mL | INTRAVENOUS | Status: DC | PRN

## 2020-07-24 ENCOUNTER — Other Ambulatory Visit (HOSPITAL_COMMUNITY): Payer: Medicare HMO

## 2020-07-25 ENCOUNTER — Ambulatory Visit (HOSPITAL_COMMUNITY)
Admission: RE | Admit: 2020-07-25 | Discharge: 2020-07-25 | Disposition: A | Discharge status: Home / Self Care | Payer: Medicare HMO | Attending: Vascular Surgery | Admitting: Vascular Surgery

## 2020-07-25 ENCOUNTER — Encounter (HOSPITAL_COMMUNITY)
Admission: RE | Disposition: A | Discharge status: Home / Self Care | Payer: Self-pay | Source: Home / Self Care | Attending: Vascular Surgery

## 2020-07-25 DIAGNOSIS — Z20822 Contact with and (suspected) exposure to covid-19: Secondary | ICD-10-CM | POA: Diagnosis not present

## 2020-07-25 DIAGNOSIS — T82590D Other mechanical complication of surgically created arteriovenous fistula, subsequent encounter: Secondary | ICD-10-CM | POA: Insufficient documentation

## 2020-07-25 DIAGNOSIS — Z538 Procedure and treatment not carried out for other reasons: Secondary | ICD-10-CM | POA: Diagnosis not present

## 2020-07-25 DIAGNOSIS — Z01818 Encounter for other preprocedural examination: Secondary | ICD-10-CM | POA: Diagnosis present

## 2020-07-25 LAB — POCT I-STAT, CHEM 8
BUN: 53 mg/dL — ABNORMAL HIGH (ref 6–20)
Calcium, Ion: 1.18 mmol/L (ref 1.15–1.40)
Chloride: 99 mmol/L (ref 98–111)
Creatinine, Ser: 6.7 mg/dL — ABNORMAL HIGH (ref 0.44–1.00)
Glucose, Bld: 105 mg/dL — ABNORMAL HIGH (ref 70–99)
HCT: 29 % — ABNORMAL LOW (ref 36.0–46.0)
Hemoglobin: 9.9 g/dL — ABNORMAL LOW (ref 12.0–15.0)
Potassium: 4.2 mmol/L (ref 3.5–5.1)
Sodium: 140 mmol/L (ref 135–145)
TCO2: 27 mmol/L (ref 22–32)

## 2020-07-25 LAB — SARS CORONAVIRUS 2 BY RT PCR (HOSPITAL ORDER, PERFORMED IN ~~LOC~~ HOSPITAL LAB): SARS Coronavirus 2: NEGATIVE

## 2020-07-25 LAB — HCG, SERUM, QUALITATIVE: Preg, Serum: NEGATIVE

## 2020-07-25 SURGERY — A/V FISTULAGRAM
Anesthesia: LOCAL

## 2020-07-26 ENCOUNTER — Other Ambulatory Visit: Payer: Self-pay

## 2020-07-26 NOTE — H&P (Signed)
Examined patient in preop holding. She has a large, aneurysmal LUE AVF with two areas of hypopigmentation. She has >4L of flow through this fistula. Her cardiologist is concerned that amount of flow through a fistula may induce heart failure. The fistula has a smooth thrill. There is no evidence of upstream stenosis. I offered the patient an elective AVF revision next week in OR in lieu of fistulagram which is unlikely to be helpful to the patient. She would like to proceed this way. Message sent to my office to arrange OR date.  Yevonne Aline. Stanford Breed, MD [facility] Phone Number: [phone] 07/26/2020 7:38 AM

## 2020-07-27 ENCOUNTER — Encounter (HOSPITAL_COMMUNITY): Payer: Self-pay

## 2020-07-27 ENCOUNTER — Other Ambulatory Visit: Payer: Self-pay

## 2020-07-27 NOTE — Progress Notes (Signed)
PCP Tamala Julian, FNP Cardiologist Johnsie Cancel , MD  Chest x-ray - 12/07/19 EKG - 12/13/19 Stress Test -  ECHO - 12/08/19 Cardiac Cath -   COVID TEST- pt does not have transportation to be swabbed before surgery - will come 3 hrs prior to surgery for covid test DOS  Anesthesia review: n/a  -------------  SDW INSTRUCTIONS:  Your procedure is scheduled on 07/30/20 (Monday). Please report to [facility] Main Entrance "A" at 06:30 A.M., and check in at the Admitting office. Call this number if you have problems the morning of surgery: [phone]  You will be covid tested upon your arrival to Short Stay  Remember: Do not eat or drink after midnight the night before your surgery Medications to take morning of surgery with a sip of water include: Coreg Hydralazine Keppra  As of today, STOP taking any Aspirin (unless otherwise instructed by your surgeon), Aleve, Naproxen, Ibuprofen, Motrin, Advil, Goody's, BC's, all herbal medications, fish oil, and all vitamins.    The Morning of Surgery Do not wear jewelry, make-up or nail polish. Do not wear lotions, powders, or perfumes, or deodorant Do not shave 48 hours prior to surgery.   Do not bring valuables to the hospital. [facility] is not responsible for any belongings or valuables. If you are a smoker, DO NOT Smoke 24 hours prior to surgery If you wear a CPAP at night please bring your mask the morning of surgery  Remember that you must have someone to transport you home after your surgery, and remain with you for 24 hours if you are discharged the same day. Please bring cases for contacts, glasses, hearing aids, dentures or bridgework because it cannot be worn into surgery.   Patients discharged the day of surgery will not be allowed to drive home.   Please shower the NIGHT BEFORE SURGERY and the MORNING OF SURGERY with DIAL Soap. Wear comfortable clothes the morning of surgery. Oral Hygiene is also important to reduce your risk of infection.   Remember - BRUSH YOUR TEETH THE MORNING OF SURGERY WITH YOUR REGULAR TOOTHPASTE  Patient denies shortness of breath, fever, cough and chest pain.

## 2020-07-30 ENCOUNTER — Ambulatory Visit (HOSPITAL_COMMUNITY): Payer: Medicare HMO | Admitting: Anesthesiology

## 2020-07-30 ENCOUNTER — Other Ambulatory Visit: Payer: Self-pay

## 2020-07-30 ENCOUNTER — Observation Stay (HOSPITAL_COMMUNITY)
Admission: RE | Admit: 2020-07-30 | Discharge: 2020-08-01 | Disposition: A | Discharge status: Home / Self Care | Payer: Medicare HMO | Attending: Vascular Surgery | Admitting: Vascular Surgery

## 2020-07-30 ENCOUNTER — Ambulatory Visit (HOSPITAL_COMMUNITY): Payer: Medicare HMO

## 2020-07-30 ENCOUNTER — Encounter (HOSPITAL_COMMUNITY)
Admission: RE | Disposition: A | Discharge status: Home / Self Care | Payer: Self-pay | Source: Home / Self Care | Attending: Vascular Surgery

## 2020-07-30 ENCOUNTER — Encounter (HOSPITAL_COMMUNITY): Payer: Self-pay

## 2020-07-30 DIAGNOSIS — Z9889 Other specified postprocedural states: Secondary | ICD-10-CM

## 2020-07-30 DIAGNOSIS — J90 Pleural effusion, not elsewhere classified: Secondary | ICD-10-CM | POA: Diagnosis not present

## 2020-07-30 DIAGNOSIS — Z79899 Other long term (current) drug therapy: Secondary | ICD-10-CM | POA: Insufficient documentation

## 2020-07-30 DIAGNOSIS — Z20822 Contact with and (suspected) exposure to covid-19: Secondary | ICD-10-CM | POA: Diagnosis not present

## 2020-07-30 DIAGNOSIS — I120 Hypertensive chronic kidney disease with stage 5 chronic kidney disease or end stage renal disease: Secondary | ICD-10-CM | POA: Diagnosis not present

## 2020-07-30 DIAGNOSIS — N186 End stage renal disease: Secondary | ICD-10-CM | POA: Insufficient documentation

## 2020-07-30 DIAGNOSIS — N2581 Secondary hyperparathyroidism of renal origin: Secondary | ICD-10-CM | POA: Insufficient documentation

## 2020-07-30 DIAGNOSIS — D631 Anemia in chronic kidney disease: Secondary | ICD-10-CM | POA: Insufficient documentation

## 2020-07-30 DIAGNOSIS — Z992 Dependence on renal dialysis: Secondary | ICD-10-CM | POA: Diagnosis not present

## 2020-07-30 DIAGNOSIS — T82898A Other specified complication of vascular prosthetic devices, implants and grafts, initial encounter: Secondary | ICD-10-CM | POA: Diagnosis not present

## 2020-07-30 DIAGNOSIS — F17210 Nicotine dependence, cigarettes, uncomplicated: Secondary | ICD-10-CM | POA: Diagnosis not present

## 2020-07-30 DIAGNOSIS — I770 Arteriovenous fistula, acquired: Secondary | ICD-10-CM | POA: Diagnosis not present

## 2020-07-30 DIAGNOSIS — R52 Pain, unspecified: Secondary | ICD-10-CM

## 2020-07-30 HISTORY — PX: INSERTION OF DIALYSIS CATHETER: SHX1324

## 2020-07-30 HISTORY — PX: REVISON OF ARTERIOVENOUS FISTULA: SHX6074

## 2020-07-30 HISTORY — PX: COMPLEX WOUND CLOSURE: SHX6446

## 2020-07-30 LAB — CBC
HCT: 26.9 % — ABNORMAL LOW (ref 36.0–46.0)
HCT: 27.1 % — ABNORMAL LOW (ref 36.0–46.0)
Hemoglobin: 8.5 g/dL — ABNORMAL LOW (ref 12.0–15.0)
Hemoglobin: 8.7 g/dL — ABNORMAL LOW (ref 12.0–15.0)
MCH: 33.3 pg (ref 26.0–34.0)
MCH: 33.3 pg (ref 26.0–34.0)
MCHC: 31.6 g/dL (ref 30.0–36.0)
MCHC: 32.1 g/dL (ref 30.0–36.0)
MCV: 105.5 fL — ABNORMAL HIGH (ref 80.0–100.0)
MCV: 103.8 fL — ABNORMAL HIGH (ref 80.0–100.0)
Platelets: 117 10*3/uL — ABNORMAL LOW (ref 150–400)
Platelets: 114 10*3/uL — ABNORMAL LOW (ref 150–400)
RBC: 2.55 MIL/uL — ABNORMAL LOW (ref 3.87–5.11)
RBC: 2.61 MIL/uL — ABNORMAL LOW (ref 3.87–5.11)
RDW: 13.2 % (ref 11.5–15.5)
RDW: 13.4 % (ref 11.5–15.5)
WBC: 7.1 10*3/uL (ref 4.0–10.5)
WBC: 8.3 10*3/uL (ref 4.0–10.5)
nRBC: 0 % (ref 0.0–0.2)
nRBC: 0 % (ref 0.0–0.2)

## 2020-07-30 LAB — COMPREHENSIVE METABOLIC PANEL
ALT: 11 U/L (ref 0–44)
AST: 14 U/L — ABNORMAL LOW (ref 15–41)
Albumin: 3.5 g/dL (ref 3.5–5.0)
Alkaline Phosphatase: 35 U/L — ABNORMAL LOW (ref 38–126)
Anion gap: 14 (ref 5–15)
BUN: 59 mg/dL — ABNORMAL HIGH (ref 6–20)
CO2: 25 mmol/L (ref 22–32)
Calcium: 9.1 mg/dL (ref 8.9–10.3)
Chloride: 97 mmol/L — ABNORMAL LOW (ref 98–111)
Creatinine, Ser: 8.73 mg/dL — ABNORMAL HIGH (ref 0.44–1.00)
GFR, Estimated: 5 mL/min — ABNORMAL LOW (ref >60)
Glucose, Bld: 114 mg/dL — ABNORMAL HIGH (ref 70–99)
Potassium: 4.3 mmol/L (ref 3.5–5.1)
Sodium: 136 mmol/L (ref 135–145)
Total Bilirubin: 0.6 mg/dL (ref 0.3–1.2)
Total Protein: 6.3 g/dL — ABNORMAL LOW (ref 6.5–8.1)

## 2020-07-30 LAB — POCT I-STAT, CHEM 8
BUN: 51 mg/dL — ABNORMAL HIGH (ref 6–20)
Calcium, Ion: 1.19 mmol/L (ref 1.15–1.40)
Chloride: 100 mmol/L (ref 98–111)
Creatinine, Ser: 8.7 mg/dL — ABNORMAL HIGH (ref 0.44–1.00)
Glucose, Bld: 95 mg/dL (ref 70–99)
HCT: 31 % — ABNORMAL LOW (ref 36.0–46.0)
Hemoglobin: 10.5 g/dL — ABNORMAL LOW (ref 12.0–15.0)
Potassium: 3.8 mmol/L (ref 3.5–5.1)
Sodium: 138 mmol/L (ref 135–145)
TCO2: 27 mmol/L (ref 22–32)

## 2020-07-30 LAB — HCG, QUANTITATIVE, PREGNANCY: hCG, Beta Chain, Quant, S: 3 m[IU]/mL (ref <5)

## 2020-07-30 LAB — SARS CORONAVIRUS 2 BY RT PCR (HOSPITAL ORDER, PERFORMED IN ~~LOC~~ HOSPITAL LAB): SARS Coronavirus 2: NEGATIVE

## 2020-07-30 SURGERY — REVISON OF ARTERIOVENOUS FISTULA
Anesthesia: Regional | Site: Chest | Laterality: Right

## 2020-07-30 MED ORDER — SODIUM CHLORIDE 0.9 % IV SOLN
INTRAVENOUS | Status: DC
  Administered 2020-07-30: 10 mL/h via INTRAVENOUS

## 2020-07-30 MED ORDER — FENTANYL CITRATE (PF) 250 MCG/5ML IJ SOLN
INTRAMUSCULAR | Status: DC | PRN
  Administered 2020-07-30 (×2): 50 ug via INTRAVENOUS

## 2020-07-30 MED ORDER — FENTANYL CITRATE (PF) 100 MCG/2ML IJ SOLN
INTRAMUSCULAR | Status: AC
  Filled 2020-07-30: qty 2

## 2020-07-30 MED ORDER — OXYCODONE-ACETAMINOPHEN 5-325 MG PO TABS
ORAL_TABLET | ORAL | Status: AC
  Filled 2020-07-30: qty 2

## 2020-07-30 MED ORDER — FENTANYL CITRATE (PF) 100 MCG/2ML IJ SOLN
25.0000 ug | INTRAMUSCULAR | Status: DC | PRN
  Administered 2020-07-30 (×2): 50 ug via INTRAVENOUS

## 2020-07-30 MED ORDER — CHLORHEXIDINE GLUCONATE 4 % EX LIQD: 60.0000 mL | Freq: Once | CUTANEOUS | Status: DC

## 2020-07-30 MED ORDER — SODIUM CHLORIDE 0.9 % IV SOLN
INTRAVENOUS | Status: DC | PRN
  Administered 2020-07-30: 500 mL

## 2020-07-30 MED ORDER — HYDROMORPHONE HCL 1 MG/ML IJ SOLN
0.5000 mg | INTRAMUSCULAR | Status: DC | PRN
  Administered 2020-07-30 – 2020-08-01 (×8): 1 mg via INTRAVENOUS
  Filled 2020-07-30 (×7): qty 1

## 2020-07-30 MED ORDER — ACETAMINOPHEN 160 MG/5ML PO SOLN: 325.0000 mg | Freq: Once | ORAL | Status: DC | PRN

## 2020-07-30 MED ORDER — METOPROLOL TARTRATE 5 MG/5ML IV SOLN: 2.0000 mg | INTRAVENOUS | Status: DC | PRN

## 2020-07-30 MED ORDER — SODIUM CHLORIDE 0.9% FLUSH: 3.0000 mL | INTRAVENOUS | Status: DC | PRN

## 2020-07-30 MED ORDER — AMISULPRIDE (ANTIEMETIC) 5 MG/2ML IV SOLN: 10.0000 mg | Freq: Once | INTRAVENOUS | Status: DC | PRN

## 2020-07-30 MED ORDER — AMLODIPINE BESYLATE 10 MG PO TABS
10.0000 mg | ORAL_TABLET | Freq: Every day | ORAL | Status: DC
  Administered 2020-07-30 – 2020-07-31 (×2): 10 mg via ORAL
  Filled 2020-07-30 (×2): qty 1

## 2020-07-30 MED ORDER — ACETAMINOPHEN 325 MG PO TABS: 325.0000 mg | ORAL_TABLET | Freq: Once | ORAL | Status: DC | PRN

## 2020-07-30 MED ORDER — HEPARIN SODIUM (PORCINE) 1000 UNIT/ML IJ SOLN
INTRAMUSCULAR | Status: DC | PRN
  Administered 2020-07-30: 3200 [IU]

## 2020-07-30 MED ORDER — DIPHENHYDRAMINE HCL 12.5 MG/5ML PO ELIX
ORAL_SOLUTION | ORAL | Status: AC
  Filled 2020-07-30: qty 10

## 2020-07-30 MED ORDER — SPIRONOLACTONE 25 MG PO TABS
50.0000 mg | ORAL_TABLET | Freq: Every day | ORAL | Status: DC
  Administered 2020-07-30 – 2020-08-01 (×3): 50 mg via ORAL
  Filled 2020-07-30 (×3): qty 2

## 2020-07-30 MED ORDER — CEFAZOLIN SODIUM-DEXTROSE 2-4 GM/100ML-% IV SOLN
2.0000 g | INTRAVENOUS | Status: AC
  Administered 2020-07-30: 2 g via INTRAVENOUS
  Filled 2020-07-30: qty 100

## 2020-07-30 MED ORDER — POTASSIUM CHLORIDE CRYS ER 20 MEQ PO TBCR
20.0000 meq | EXTENDED_RELEASE_TABLET | Freq: Once | ORAL | Status: AC
  Administered 2020-07-30: 20 meq via ORAL
  Filled 2020-07-30: qty 1

## 2020-07-30 MED ORDER — HYDRALAZINE HCL 50 MG PO TABS
100.0000 mg | ORAL_TABLET | Freq: Three times a day (TID) | ORAL | Status: DC
  Administered 2020-07-30 – 2020-08-01 (×5): 100 mg via ORAL
  Filled 2020-07-30 (×5): qty 2

## 2020-07-30 MED ORDER — HYDROMORPHONE HCL 1 MG/ML IJ SOLN
INTRAMUSCULAR | Status: AC
  Filled 2020-07-30: qty 1

## 2020-07-30 MED ORDER — PANTOPRAZOLE SODIUM 40 MG PO TBEC
40.0000 mg | DELAYED_RELEASE_TABLET | Freq: Every day | ORAL | Status: DC
  Administered 2020-07-31 – 2020-08-01 (×2): 40 mg via ORAL
  Filled 2020-07-30 (×2): qty 1

## 2020-07-30 MED ORDER — LIDOCAINE-EPINEPHRINE (PF) 1 %-1:200000 IJ SOLN
INTRAMUSCULAR | Status: DC | PRN
  Administered 2020-07-30: 30 mL

## 2020-07-30 MED ORDER — MEPERIDINE HCL 25 MG/ML IJ SOLN: 6.2500 mg | INTRAMUSCULAR | Status: DC | PRN

## 2020-07-30 MED ORDER — MIDAZOLAM HCL 2 MG/2ML IJ SOLN
INTRAMUSCULAR | Status: AC
  Administered 2020-07-30: 1 mg via INTRAVENOUS
  Filled 2020-07-30: qty 2

## 2020-07-30 MED ORDER — LEVETIRACETAM ER 500 MG PO TB24
1000.0000 mg | ORAL_TABLET | Freq: Every day | ORAL | Status: DC
  Administered 2020-07-31 – 2020-08-01 (×2): 1000 mg via ORAL
  Filled 2020-07-30 (×3): qty 2

## 2020-07-30 MED ORDER — FENTANYL CITRATE (PF) 250 MCG/5ML IJ SOLN
INTRAMUSCULAR | Status: AC
  Filled 2020-07-30: qty 5

## 2020-07-30 MED ORDER — HYDRALAZINE HCL 20 MG/ML IJ SOLN: 5.0000 mg | INTRAMUSCULAR | Status: DC | PRN

## 2020-07-30 MED ORDER — PROPOFOL 500 MG/50ML IV EMUL
INTRAVENOUS | Status: DC | PRN
  Administered 2020-07-30: 100 ug/kg/min via INTRAVENOUS
  Administered 2020-07-30: 130 ug/kg/min via INTRAVENOUS

## 2020-07-30 MED ORDER — CHLORHEXIDINE GLUCONATE 0.12 % MT SOLN
15.0000 mL | Freq: Once | OROMUCOSAL | Status: AC
  Filled 2020-07-30: qty 15

## 2020-07-30 MED ORDER — ONDANSETRON HCL 4 MG/2ML IJ SOLN
INTRAMUSCULAR | Status: DC | PRN
  Administered 2020-07-30: 4 mg via INTRAVENOUS

## 2020-07-30 MED ORDER — GUAIFENESIN-DM 100-10 MG/5ML PO SYRP: 15.0000 mL | ORAL_SOLUTION | ORAL | Status: DC | PRN

## 2020-07-30 MED ORDER — MIDAZOLAM HCL 2 MG/2ML IJ SOLN: 1.0000 mg | Freq: Once | INTRAMUSCULAR | Status: AC

## 2020-07-30 MED ORDER — SODIUM CHLORIDE 0.9% FLUSH
3.0000 mL | Freq: Two times a day (BID) | INTRAVENOUS | Status: DC
  Administered 2020-07-30 – 2020-08-01 (×3): 3 mL via INTRAVENOUS

## 2020-07-30 MED ORDER — HEPARIN SODIUM (PORCINE) 1000 UNIT/ML IJ SOLN
INTRAMUSCULAR | Status: DC | PRN
  Administered 2020-07-30: 3000 [IU] via INTRAVENOUS

## 2020-07-30 MED ORDER — FENTANYL CITRATE (PF) 100 MCG/2ML IJ SOLN
INTRAMUSCULAR | Status: AC
  Administered 2020-07-30: 50 ug via INTRAVENOUS
  Filled 2020-07-30: qty 2

## 2020-07-30 MED ORDER — FENTANYL CITRATE (PF) 100 MCG/2ML IJ SOLN: 50.0000 ug | Freq: Once | INTRAMUSCULAR | Status: AC

## 2020-07-30 MED ORDER — OXYCODONE-ACETAMINOPHEN 5-325 MG PO TABS
1.0000 | ORAL_TABLET | ORAL | Status: DC | PRN
  Administered 2020-07-30 – 2020-07-31 (×5): 2 via ORAL
  Administered 2020-07-31: 1 via ORAL
  Administered 2020-08-01 (×2): 2 via ORAL
  Filled 2020-07-30 (×2): qty 2
  Filled 2020-07-30: qty 1
  Filled 2020-07-30 (×3): qty 2

## 2020-07-30 MED ORDER — ALUM & MAG HYDROXIDE-SIMETH 200-200-20 MG/5ML PO SUSP
15.0000 mL | ORAL | Status: DC | PRN
Start: 2020-07-30 — End: 2020-07-30

## 2020-07-30 MED ORDER — LABETALOL HCL 5 MG/ML IV SOLN
10.0000 mg | INTRAVENOUS | Status: DC | PRN
Start: 2020-07-30 — End: 2020-08-01
  Administered 2020-07-30: 10 mg via INTRAVENOUS

## 2020-07-30 MED ORDER — DIPHENHYDRAMINE HCL 25 MG PO CAPS
25.0000 mg | ORAL_CAPSULE | Freq: Four times a day (QID) | ORAL | Status: DC | PRN
  Administered 2020-07-30: 25 mg via ORAL

## 2020-07-30 MED ORDER — ONDANSETRON HCL 4 MG/2ML IJ SOLN
4.0000 mg | Freq: Four times a day (QID) | INTRAMUSCULAR | Status: DC | PRN
  Administered 2020-07-30: 4 mg via INTRAVENOUS
  Filled 2020-07-30: qty 2

## 2020-07-30 MED ORDER — CHLORHEXIDINE GLUCONATE 0.12 % MT SOLN
OROMUCOSAL | Status: AC
  Administered 2020-07-30: 15 mL via OROMUCOSAL
  Filled 2020-07-30: qty 15

## 2020-07-30 MED ORDER — ACETAMINOPHEN 500 MG PO TABS
1000.0000 mg | ORAL_TABLET | ORAL | Status: DC
  Administered 2020-07-31: 1000 mg via ORAL
  Filled 2020-07-30: qty 2

## 2020-07-30 MED ORDER — LABETALOL HCL 5 MG/ML IV SOLN
INTRAVENOUS | Status: AC
  Filled 2020-07-30: qty 4

## 2020-07-30 MED ORDER — LIDOCAINE-EPINEPHRINE (PF) 1.5 %-1:200000 IJ SOLN
INTRAMUSCULAR | Status: DC | PRN
  Administered 2020-07-30: 30 mL via PERINEURAL

## 2020-07-30 MED ORDER — ACETAMINOPHEN 10 MG/ML IV SOLN: 1000.0000 mg | Freq: Once | INTRAVENOUS | Status: DC | PRN

## 2020-07-30 MED ORDER — SEVELAMER CARBONATE 800 MG PO TABS
1600.0000 mg | ORAL_TABLET | Freq: Three times a day (TID) | ORAL | Status: DC
  Administered 2020-07-30 – 2020-08-01 (×4): 1600 mg via ORAL
  Filled 2020-07-30 (×4): qty 2

## 2020-07-30 MED ORDER — CARVEDILOL 25 MG PO TABS
25.0000 mg | ORAL_TABLET | Freq: Two times a day (BID) | ORAL | Status: DC
Start: 2020-07-30 — End: 2020-08-01
  Administered 2020-07-30 – 2020-08-01 (×3): 25 mg via ORAL
  Filled 2020-07-30 (×3): qty 1

## 2020-07-30 MED ORDER — LACTATED RINGERS IV SOLN: INTRAVENOUS | Status: DC

## 2020-07-30 MED ORDER — HEPARIN SODIUM (PORCINE) 5000 UNIT/ML IJ SOLN
5000.0000 [IU] | Freq: Three times a day (TID) | INTRAMUSCULAR | Status: DC
  Administered 2020-07-31 – 2020-08-01 (×3): 5000 [IU] via SUBCUTANEOUS
  Filled 2020-07-30: qty 1

## 2020-07-30 MED ORDER — PHENOL 1.4 % MT LIQD: 1.0000 | OROMUCOSAL | Status: DC | PRN

## 2020-07-30 MED ORDER — 0.9 % SODIUM CHLORIDE (POUR BTL) OPTIME
TOPICAL | Status: DC | PRN
  Administered 2020-07-30: 1000 mL

## 2020-07-30 SURGICAL SUPPLY — 66 items
ARMBAND PINK RESTRICT EXTREMIT (MISCELLANEOUS) ×3 IMPLANT
BAG DECANTER FOR FLEXI CONT (MISCELLANEOUS) ×3 IMPLANT
BENZOIN TINCTURE PRP APPL 2/3 (GAUZE/BANDAGES/DRESSINGS) ×3 IMPLANT
BIOPATCH RED 1 DISK 7.0 (GAUZE/BANDAGES/DRESSINGS) ×3 IMPLANT
BNDG ELASTIC 4X5.8 VLCR STR LF (GAUZE/BANDAGES/DRESSINGS) ×6 IMPLANT
BNDG GAUZE ELAST 4 BULKY (GAUZE/BANDAGES/DRESSINGS) ×6 IMPLANT
CANISTER SUCT 3000ML PPV (MISCELLANEOUS) ×3 IMPLANT
CANNULA VESSEL 3MM 2 BLNT TIP (CANNULA) ×3 IMPLANT
CATH PALINDROME RT-P 15FX19CM (CATHETERS) ×3 IMPLANT
CATH PALINDROME-P 19CM W/VT (CATHETERS) IMPLANT
CATH PALINDROME-P 23CM W/VT (CATHETERS) IMPLANT
CATH PALINDROME-P 28CM W/VT (CATHETERS) IMPLANT
CHLORAPREP W/TINT 26 (MISCELLANEOUS) ×3 IMPLANT
CLIP VESOCCLUDE MED 24/CT (CLIP) ×3 IMPLANT
CLIP VESOCCLUDE MED 6/CT (CLIP) ×3 IMPLANT
CLIP VESOCCLUDE SM WIDE 24/CT (CLIP) ×3 IMPLANT
CLIP VESOCCLUDE SM WIDE 6/CT (CLIP) ×3 IMPLANT
COVER PROBE W GEL 5X96 (DRAPES) ×3 IMPLANT
COVER SURGICAL LIGHT HANDLE (MISCELLANEOUS) ×3 IMPLANT
COVER WAND RF STERILE (DRAPES) ×3 IMPLANT
DRAPE C-ARM 42X72 X-RAY (DRAPES) ×3 IMPLANT
DRAPE CHEST BREAST 15X10 FENES (DRAPES) ×3 IMPLANT
ELECT REM PT RETURN 9FT ADLT (ELECTROSURGICAL) ×3
ELECTRODE REM PT RTRN 9FT ADLT (ELECTROSURGICAL) ×2 IMPLANT
GAUZE 4X4 16PLY RFD (DISPOSABLE) ×3 IMPLANT
GAUZE SPONGE 4X4 12PLY STRL (GAUZE/BANDAGES/DRESSINGS) ×3 IMPLANT
GAUZE XEROFORM 5X9 LF (GAUZE/BANDAGES/DRESSINGS) ×6 IMPLANT
GLOVE SURG SS PI 8.0 STRL IVOR (GLOVE) ×3 IMPLANT
GOWN STRL REUS W/ TWL LRG LVL3 (GOWN DISPOSABLE) ×4 IMPLANT
GOWN STRL REUS W/ TWL XL LVL3 (GOWN DISPOSABLE) ×2 IMPLANT
GOWN STRL REUS W/TWL LRG LVL3 (GOWN DISPOSABLE) ×2
GOWN STRL REUS W/TWL XL LVL3 (GOWN DISPOSABLE) ×1
INSERT FOGARTY SM (MISCELLANEOUS) IMPLANT
KIT BASIN OR (CUSTOM PROCEDURE TRAY) ×3 IMPLANT
KIT PALINDROME-P 55CM (CATHETERS) IMPLANT
KIT TURNOVER KIT B (KITS) ×3 IMPLANT
NEEDLE 18GX1X1/2 (RX/OR ONLY) (NEEDLE) ×3 IMPLANT
NEEDLE HYPO 25GX1X1/2 BEV (NEEDLE) ×3 IMPLANT
NS IRRIG 1000ML POUR BTL (IV SOLUTION) ×3 IMPLANT
PACK CV ACCESS (CUSTOM PROCEDURE TRAY) ×3 IMPLANT
PACK SURGICAL SETUP 50X90 (CUSTOM PROCEDURE TRAY) ×3 IMPLANT
PAD ARMBOARD 7.5X6 YLW CONV (MISCELLANEOUS) ×6 IMPLANT
PENCIL SMOKE EVACUATOR (MISCELLANEOUS) ×3 IMPLANT
SET MICROPUNCTURE 5F STIFF (MISCELLANEOUS) ×6 IMPLANT
SOAP 2 % CHG 4 OZ (WOUND CARE) ×3 IMPLANT
SPONGE LAP 18X18 RF (DISPOSABLE) ×3 IMPLANT
STAPLER VISISTAT 35W (STAPLE) ×6 IMPLANT
STRIP CLOSURE SKIN 1/2X4 (GAUZE/BANDAGES/DRESSINGS) ×3 IMPLANT
SUT ETHILON 2 0 FS 18 (SUTURE) ×3 IMPLANT
SUT ETHILON 3 0 PS 1 (SUTURE) ×3 IMPLANT
SUT MNCRL AB 4-0 PS2 18 (SUTURE) ×6 IMPLANT
SUT PROLENE 4 0 RB 1 (SUTURE) ×1
SUT PROLENE 4-0 RB1 .5 CRCL 36 (SUTURE) ×2 IMPLANT
SUT PROLENE 5 0 C1 (SUTURE) ×9 IMPLANT
SUT PROLENE 6 0 BV (SUTURE) ×3 IMPLANT
SUT VIC AB 3-0 SH 18 (SUTURE) ×6 IMPLANT
SUT VIC AB 3-0 SH 27 (SUTURE) ×5
SUT VIC AB 3-0 SH 27X BRD (SUTURE) ×10 IMPLANT
SYR 10ML LL (SYRINGE) ×3 IMPLANT
SYR 20ML LL LF (SYRINGE) ×6 IMPLANT
SYR 5ML LL (SYRINGE) ×3 IMPLANT
SYR CONTROL 10ML LL (SYRINGE) ×3 IMPLANT
TOWEL GREEN STERILE (TOWEL DISPOSABLE) ×3 IMPLANT
TOWEL GREEN STERILE FF (TOWEL DISPOSABLE) ×6 IMPLANT
UNDERPAD 30X36 HEAVY ABSORB (UNDERPADS AND DIAPERS) ×3 IMPLANT
WATER STERILE IRR 1000ML POUR (IV SOLUTION) ×3 IMPLANT

## 2020-07-30 NOTE — Interval H&P Note (Signed)
History and Physical Interval Note:  07/30/2020 9:50 AM  Madeline Brown  has presented today for surgery, with the diagnosis of COMPLICATION OF ARTERIOVENOUS FISTULA.  The various methods of treatment have been discussed with the patient and family. After consideration of risks, benefits and other options for treatment, the patient has consented to  Procedure(s) with comments: LEFT UPPER EXTREMITY ARTERIOVENOUS FISTULA REVISON (Left) - PERIPHERAL NERVE BLOCK INSERTION OF DIALYSIS CATHETER (Right) as a surgical intervention.  The patient's history has been reviewed, patient examined, no change in status, stable for surgery.  I have reviewed the patient's chart and labs.  Questions were answered to the patient's satisfaction.     Cherre Robins

## 2020-07-30 NOTE — Op Note (Signed)
DATE OF SERVICE: 07/30/2020  PATIENT:  Madeline Brown  45 y.o. female  PRE-OPERATIVE DIAGNOSIS:  ESRD, high flow (>4L/min) through aneursymal LUE AVF  POST-OPERATIVE DIAGNOSIS:  Same  PROCEDURE:   1) Placement of tunneled dialysis catheter (RIJ 19cm) 2) Extensive excision and plication of left upper extremity brachio-cephalic arterio-venous fistula 3) Complex wound closure (28 x 8cm)  SURGEON:  Surgeon(s) and Role:    * Cherre Robins, MD - Primary  ASSISTANT: Gerri Lins, PA-C  An assistant was required to facilitate exposure and expedite the case.  ANESTHESIA:   regional and IV sedation  EBL: 368mL  BLOOD ADMINISTERED:none  DRAINS: none   LOCAL MEDICATIONS USED:  LIDOCAINE   SPECIMEN:  none  COUNTS: confirmed correct.  TOURNIQUET:  * No tourniquets in log *  PATIENT DISPOSITION:  PACU - hemodynamically stable.   Delay start of Pharmacological VTE agent (>24hrs) due to surgical blood loss or risk of bleeding: no  INDICATION FOR PROCEDURE: Madeline Brown is a 45 y.o. female with ESRD dialyzing via left upper extremity brachiocephalic AV fistula. Become severely aneurysmal and has greater than 4 L of flow through it a minute. Her cardiologist, Dr. Johnsie Cancel and I are both concerned that this may be putting undue strain on her heart. After careful discussion of risks, benefits, and alternatives the patient was offered revision with [facility] placement. We specifically discussed risk of losing access. The patient understood and wished to proceed.  OPERATIVE FINDINGS: Extensive aneurysmal AV fistula partially excised and ready anastomosed end to end. I plicated the anterior surface of the fistula over a 18 French red rubber catheter.  DESCRIPTION OF PROCEDURE: After identification of the patient in the pre-operative holding area, the patient was transferred to the operating room. The patient was positioned supine on the operating room table. Anesthesia was induced. The left arm,  chest, neck were prepped and draped in standard fashion. A surgical pause was performed confirming correct patient, procedure, and operative location.  Using ultrasound guidance the right internal jugular vein was accessed with micropuncture technique.  Through the micropuncture sheath a floppy J-wire was advanced into the superior vena cava.  A small incision was made around the skin access point.  The access point was serially dilated under direct fluoroscopic guidance.  A peel-away sheath was introduced into the superior vena cava under fluoroscopic guidance.  A counterincision was made in the chest under the clavicle.  A 19 cm tunnel dialysis catheter was then tunneled under the skin, over the clavicle into the incision in the neck.  The tunneling device was removed and the catheter fed through the peel-away sheath into the superior vena cava.  The peel-away sheath was removed and the catheter gently pulled back.  Adequate position was confirmed with x-ray.  The catheter was tested and found to flush and draw back well.  Catheter was heparin locked.  Caps were applied.  Catheter was sutured to the skin.  The neck incision was closed with 4-0 Monocryl.  An ellipse incision was planned over the course of the left upper extremity brachiocephalic AV fistula. This is carried down through the skin and subcutaneous tissue until the fistula was encountered. The entire ellipse of skin was removed. It measured 28 x 8 cm total. The combination of sharp and electrocautery dissection the fistula was skeletonized from the surroundings subcutaneous tissues. This was incredibly tedious and took about an hour to do. She was heparinized with 5000 units of IV heparin. Once the fistula was completely mobilized,  the fistula was clamped proximally and distally and we excised a portion that would allow a tension-free end-to-end anastomosis with the remaining fistula (about 10 cm). The posterior and lateral walls of the end to end  anastomosis was performed with continuous running suture of 5-0 Prolene on a C1 needle. At the anterior aspect of the anastomosis the sutures were joined and plication begun cranially and peripherally. This was done in continuous running fashion with the same strand of 5-0 Prolene. Before completing the plication I ensured a 18 French red rubber catheter would pass easily through the fistula. A prior to completion the fistula was flushed and de-aired. The plication was completed. The clamps were released. Hemostasis was achieved in the fistula with several interrupted 5-0 Prolene repair sutures. Ooze was noted throughout the surgical bed. Hemostasis was achieved, diffuse oozing persisted despite extensive efforts. The wound was closed with interrupted sutures of 3-0 Vicryl and surgical stapler. A bulky dressing was applied.  Upon completion of the case instrument and sharps counts were confirmed correct. The patient was transferred to the PACU in good condition. I was present for all portions of the procedure.  Yevonne Aline. Stanford Breed, MD [facility] Phone Number: [phone] 07/30/2020 12:24 PM

## 2020-07-30 NOTE — Anesthesia Procedure Notes (Signed)
Anesthesia Regional Block: Supraclavicular block   Pre-Anesthetic Checklist: ,, timeout performed, Correct Patient, Correct Site, Correct Laterality, Correct Procedure, Correct Position, site marked, Risks and benefits discussed,  Surgical consent,  Pre-op evaluation,  At surgeon's request and post-op pain management  Laterality: Left  Prep: chloraprep       Needles:  Injection technique: Single-shot  Needle Type: Echogenic Stimulator Needle     Needle Length: 9cm  Needle Gauge: 21     Additional Needles:   Procedures:,,,, ultrasound used (permanent image in chart),,,,  Narrative:  Start time: 07/30/2020 9:20 AM End time: 07/30/2020 9:25 AM Injection made incrementally with aspirations every 5 mL.  Performed by: Personally  Anesthesiologist: Effie Berkshire, MD  Additional Notes: Patient tolerated the procedure well. Local anesthetic introduced in an incremental fashion under minimal resistance after negative aspirations. No paresthesias were elicited. After completion of the procedure, no acute issues were identified and patient continued to be monitored by RN.

## 2020-07-30 NOTE — Anesthesia Preprocedure Evaluation (Addendum)
Anesthesia Evaluation  Patient identified by MRN, date of birth, ID band Patient awake    Reviewed: Allergy & Precautions, NPO status , Patient's Chart, lab work & pertinent test results  Airway Mallampati: I  TM Distance: >3 FB Neck ROM: Full    Dental  (+) Teeth Intact, Dental Advisory Given, Poor Dentition, Missing   Pulmonary Current Smoker,    breath sounds clear to auscultation       Cardiovascular hypertension,  Rhythm:Regular Rate:Normal     Neuro/Psych  Headaches, Seizures -,  negative psych ROS   GI/Hepatic negative GI ROS, Neg liver ROS,   Endo/Other  negative endocrine ROS  Renal/GU Dialysis and ESRFRenal disease     Musculoskeletal negative musculoskeletal ROS (+)   Abdominal Normal abdominal exam  (+)   Peds  Hematology negative hematology ROS (+)   Anesthesia Other Findings   Reproductive/Obstetrics                            Anesthesia Physical Anesthesia Plan  ASA: III  Anesthesia Plan: Regional   Post-op Pain Management:    Induction: Intravenous  PONV Risk Score and Plan: 2 and Ondansetron, Propofol infusion and Midazolam  Airway Management Planned: Natural Airway and Simple Face Mask  Additional Equipment: None  Intra-op Plan:   Post-operative Plan:   Informed Consent: I have reviewed the patients History and Physical, chart, labs and discussed the procedure including the risks, benefits and alternatives for the proposed anesthesia with the patient or authorized representative who has indicated his/her understanding and acceptance.       Plan Discussed with: CRNA  Anesthesia Plan Comments: (Echo:  1. Left ventricular ejection fraction, by estimation, is 60 to 65%. The  left ventricle has normal function. The left ventricle has no regional  wall motion abnormalities. There is severe left ventricular hypertrophy.  Left ventricular diastolic parameters   are indeterminate.  2. Right ventricular systolic function is normal. The right ventricular  size is normal. Tricuspid regurgitation signal is inadequate for assessing  PA pressure.  3. The mitral valve is normal in structure. No evidence of mitral valve  regurgitation.  4. The aortic valve is tricuspid. Aortic valve regurgitation is not  visualized. No aortic stenosis is present.  5. The inferior vena cava is normal in size with greater than 50%  respiratory variability, suggesting right atrial pressure of 3 mmHg. )       Anesthesia Quick Evaluation

## 2020-07-30 NOTE — Transfer of Care (Signed)
Immediate Anesthesia Transfer of Care Note  Patient: Madeline Brown  Procedure(s) Performed: LEFT UPPER EXTREMITY ARTERIOVENOUS FISTULA REVISON (Left Arm Upper) INSERTION OF DIALYSIS CATHETER (Right Chest) COMPLEX WOUND CLOSURE (Left Arm Upper)  Patient Location: PACU  Anesthesia Type:MAC and Regional  Level of Consciousness: awake, alert  and oriented  Airway & Oxygen Therapy: Patient Spontanous Breathing and Patient connected to face mask oxygen  Post-op Assessment: Report given to RN and Post -op Vital signs reviewed and stable  Post vital signs: Reviewed and stable  Last Vitals:  Vitals Value Taken Time  BP 119/86 07/30/20 1242  Temp    Pulse 74 07/30/20 1244  Resp 13 07/30/20 1244  SpO2 98 % 07/30/20 1244  Vitals shown include unvalidated device data.  Last Pain:  Vitals:   07/30/20 0805  TempSrc:   PainSc: 0-No pain      Patients Stated Pain Goal: 3 (36/43/83 7793)  Complications: No complications documented.

## 2020-07-30 NOTE — Anesthesia Postprocedure Evaluation (Signed)
Anesthesia Post Note  Patient: Madeline Brown  Procedure(s) Performed: LEFT UPPER EXTREMITY ARTERIOVENOUS FISTULA REVISON (Left Arm Upper) INSERTION OF DIALYSIS CATHETER (Right Chest) COMPLEX WOUND CLOSURE (Left Arm Upper)     Patient location during evaluation: PACU Anesthesia Type: Regional Level of consciousness: awake and alert Pain management: pain level controlled Vital Signs Assessment: post-procedure vital signs reviewed and stable Respiratory status: spontaneous breathing, nonlabored ventilation, respiratory function stable and patient connected to nasal cannula oxygen Cardiovascular status: stable and blood pressure returned to baseline Postop Assessment: no apparent nausea or vomiting Anesthetic complications: no   No complications documented.  Last Vitals:  Vitals:   07/30/20 1645 07/30/20 1700  BP: 132/88   Pulse: (!) 55 67  Resp: 10 12  Temp:  36.5 C  SpO2: 100% 100%    Last Pain:  Vitals:   07/30/20 1700  TempSrc:   PainSc: [place]

## 2020-07-30 NOTE — Progress Notes (Signed)
Called to see patient for bleeding through dressing left upper arm AV fistula plication site.  Patient's entire dressing was removed all the way down to the skin incision.  There were 2 areas of skin oozing 1 in the proximal third of the incision and one in the distal third of the incision.  The blood was fairly dark in color and did not appear arterial in nature.  Op note was reviewed and described diffuse oozing which seems similar currently.  There is a palpable thrill in the fistula.  Approximately 100 cc of blood was on the dressing.  New 4 x 4's Kerlixx and Ace wrap was applied.  There was no immediate bleedthrough.  Patient currently has some numbness and tingling in her hand but did have a left arm block placed by anesthesia.  Continue to monitor if continued oozing may need reexploration.  Patient currently is hemodynamically stable and conversant.  She will call her nurse if she has any worsening of numbness and tingling in her hand and may need a loosening of the dressing.  Ruta Hinds, MD [facility] [place] Office: [phone]

## 2020-07-30 NOTE — Progress Notes (Addendum)
  Day of Surgery Note    Subjective:  Arm pain. She had nerve block performed.   Vitals:   07/30/20 1445 07/30/20 1500  BP: (!) 132/92 (!) 138/94  Pulse: 67 84  Resp: 18 15  Temp:    SpO2: 100% 97%    Extremities:  LUE wrapped in ACE wrap from hand to shoulder. No strike through. Hand is warm with 4/5 grip strength Cardiac:  RRR Lungs:  Non-labored  CXR: IMPRESSION: 1. Right IJ dialysis catheter placement without pneumothorax. 2. Volume loss in the medial aspects of both lower lobes. Difficult to exclude pneumonia. 3. Tiny bilateral pleural effusions.  H and H: 8.5 and 26.9 at 1436 hrs  Assessment/Plan:  This is a 45 y.o. female who is s/p  PROCEDURE:   1) Placement of tunneled dialysis catheter (RIJ 19cm) 2) Extensive excision and plication of left upper extremity brachio-cephalic arterio-venous fistula 3) Complex wound closure (28 x 8cm)  Her VSS and CXR without ptx.  Post-op CBC with 2 gram drop in hgb and no signs on ongoing active bleeding. Continue to monitor  Risa Grill, PA-C 07/30/2020 3:26 PM [phone]

## 2020-07-31 ENCOUNTER — Encounter (HOSPITAL_COMMUNITY): Payer: Self-pay | Admitting: Vascular Surgery

## 2020-07-31 DIAGNOSIS — I120 Hypertensive chronic kidney disease with stage 5 chronic kidney disease or end stage renal disease: Secondary | ICD-10-CM | POA: Diagnosis not present

## 2020-07-31 LAB — RENAL FUNCTION PANEL
Albumin: 3.3 g/dL — ABNORMAL LOW (ref 3.5–5.0)
Anion gap: 14 (ref 5–15)
BUN: 67 mg/dL — ABNORMAL HIGH (ref 6–20)
CO2: 26 mmol/L (ref 22–32)
Calcium: 9 mg/dL (ref 8.9–10.3)
Chloride: 89 mmol/L — ABNORMAL LOW (ref 98–111)
Creatinine, Ser: 9.51 mg/dL — ABNORMAL HIGH (ref 0.44–1.00)
GFR, Estimated: 5 mL/min — ABNORMAL LOW (ref >60)
Glucose, Bld: 110 mg/dL — ABNORMAL HIGH (ref 70–99)
Phosphorus: 7.1 mg/dL — ABNORMAL HIGH (ref 2.5–4.6)
Potassium: 4.5 mmol/L (ref 3.5–5.1)
Sodium: 129 mmol/L — ABNORMAL LOW (ref 135–145)

## 2020-07-31 LAB — CBC
HCT: 18.8 % — ABNORMAL LOW (ref 36.0–46.0)
Hemoglobin: 6.3 g/dL — CL (ref 12.0–15.0)
MCH: 33.7 pg (ref 26.0–34.0)
MCHC: 33.5 g/dL (ref 30.0–36.0)
MCV: 100.5 fL — ABNORMAL HIGH (ref 80.0–100.0)
Platelets: 95 10*3/uL — ABNORMAL LOW (ref 150–400)
RBC: 1.87 MIL/uL — ABNORMAL LOW (ref 3.87–5.11)
RDW: 13.3 % (ref 11.5–15.5)
WBC: 6.3 10*3/uL (ref 4.0–10.5)
nRBC: 0 % (ref 0.0–0.2)

## 2020-07-31 LAB — PREPARE RBC (CROSSMATCH)

## 2020-07-31 MED ORDER — DOXERCALCIFEROL 4 MCG/2ML IV SOLN
INTRAVENOUS | Status: AC
  Administered 2020-07-31: 1 ug via INTRAVENOUS
  Filled 2020-07-31: qty 2

## 2020-07-31 MED ORDER — SODIUM CHLORIDE 0.9% IV SOLUTION: Freq: Once | INTRAVENOUS | Status: DC

## 2020-07-31 MED ORDER — HEPARIN SODIUM (PORCINE) 1000 UNIT/ML IJ SOLN
INTRAMUSCULAR | Status: AC
  Administered 2020-07-31: 3200 [IU] via INTRAVENOUS_CENTRAL
  Filled 2020-07-31: qty 4

## 2020-07-31 MED ORDER — DOXERCALCIFEROL 4 MCG/2ML IV SOLN: 1.0000 ug | INTRAVENOUS | Status: DC

## 2020-07-31 MED ORDER — LIDOCAINE HCL (PF) 1 % IJ SOLN: 5.0000 mL | INTRAMUSCULAR | Status: DC | PRN

## 2020-07-31 MED ORDER — CHLORHEXIDINE GLUCONATE CLOTH 2 % EX PADS: 6.0000 | MEDICATED_PAD | Freq: Every day | CUTANEOUS | Status: DC

## 2020-07-31 MED ORDER — PENTAFLUOROPROP-TETRAFLUOROETH EX AERO: 1.0000 "application " | INHALATION_SPRAY | CUTANEOUS | Status: DC | PRN

## 2020-07-31 MED ORDER — ALTEPLASE 2 MG IJ SOLR: 2.0000 mg | Freq: Once | INTRAMUSCULAR | Status: DC | PRN

## 2020-07-31 MED ORDER — SODIUM CHLORIDE 0.9 % IV SOLN: 100.0000 mL | INTRAVENOUS | Status: DC | PRN

## 2020-07-31 MED ORDER — LIDOCAINE-PRILOCAINE 2.5-2.5 % EX CREA: 1.0000 "application " | TOPICAL_CREAM | CUTANEOUS | Status: DC | PRN

## 2020-07-31 MED ORDER — HEPARIN SODIUM (PORCINE) 1000 UNIT/ML DIALYSIS: 1000.0000 [IU] | INTRAMUSCULAR | Status: DC | PRN

## 2020-07-31 MED ORDER — WHITE PETROLATUM EX OINT
TOPICAL_OINTMENT | CUTANEOUS | Status: AC
  Administered 2020-07-31: 0.2
  Filled 2020-07-31: qty 28.35

## 2020-07-31 MED ORDER — CHLORHEXIDINE GLUCONATE CLOTH 2 % EX PADS
6.0000 | MEDICATED_PAD | Freq: Every day | CUTANEOUS | Status: DC
  Administered 2020-08-01: 6 via TOPICAL

## 2020-07-31 NOTE — Progress Notes (Addendum)
[facility]   Subjective  - Significant pain in the left UE.   Objective 112/77 67 98.5 F (36.9 C) 16 92%  Intake/Output Summary (Last 24 hours) at 07/31/2020 0800 Last data filed at 07/31/2020 0600 Gross per 24 hour  Intake 580 ml  Output 150 ml  Net 430 ml      External dressing was dry, dressing changed showing signs of venous bleeding pinpoint areas Clean dry dressing applied   Assessment/Planning: POD #1 Revision/plication left UE AV fistula secondary to enlargement and possible cardiac compromise.  Blood loss anemia stable 8.7 this am.  Starting HG 10.5 pre-op Nephrology called for HD today TTS Possible  discharge tomorrow pending pain control and incision bleeding.  Roxy Horseman 07/31/2020 8:00 AM --  Laboratory Lab Results: Recent Labs    07/30/20 1436 07/30/20 1809  WBC 7.1 8.3  HGB 8.5* 8.7*  HCT 26.9* 27.1*  PLT 117* 114*   BMET Recent Labs    07/30/20 0822 07/30/20 1809  NA 138 136  K 3.8 4.3  CL 100 97*  CO2  --  25  GLUCOSE 95 114*  BUN 51* 59*  CREATININE 8.70* 8.73*  CALCIUM  --  9.1    COAG Lab Results  Component Value Date   INR 1.14 06/24/2017   INR 1.07 06/19/2017   INR 1.07 02/13/2017   No results found for: PTT  VASCULAR STAFF ADDENDUM: I have independently interviewed and examined the patient. I agree with the above.  Oozing overnight requiring dressing change She is quite sore today which is not suprising given extent of incision Will continue to observe secondary to pain and local wound care Plan HD as inpatient today  Yevonne Aline. Stanford Breed, MD [facility] Phone Number: [phone] 07/31/2020 9:41 AM

## 2020-07-31 NOTE — Consult Note (Addendum)
[facility] Renal Consultation Note    Indication for Consultation:  Management of ESRD/hemodialysis; anemia, hypertension/volume and secondary hyperparathyroidism  YKD:XIPJA, Malva Limes., FNP  HPI: Madeline Brown is a 45 y.o. female. ESRD on HD TTS at [facility].  Past medical history significant for HTN, Hx L ICH with seizure activity, Hx pericardial effusion and pulmonary HTN.  Patient is compliant with prescribed dialysis regimen.   Patient presented to the hospital for planned AVF revision/plication of aneurysmal LU AVF due to concern for high output CHF.  Patient admitted to observation post surgery due to pain and incision bleeding after her procedure. Pertinent findings in work up include drop in Hgb from 10.5>8.5 post surgery, K 4.3, Scr 8.73, BUN 59, CXR showing volume loss in b/l lungs with tiny b/l pleural effusions.    Seen and examined in room.  Sitting on bedside couch with friend.  Reports pain in LUE improved from this AM, now 6/10. Denies CP, SOB, n/v/d, abdominal pain, weakness, dizziness and fatigue.  Reports dialysis has been going well.    Past Medical History:  Diagnosis Date  . Anemia   . Chronic kidney disease   . Headache, unspecified 09/26/2017  . Hypertension   . Shortness of breath 07/15/2018   Past Surgical History:  Procedure Laterality Date  . AV FISTULA PLACEMENT Left 06/24/2017   Procedure: CREATION OF LEFT ARM BRACHIALCEPHALIC  ARTERIOVENOUS (AV) FISTULA;  Surgeon: Rosetta Posner, MD;  Location: [place];  Service: Vascular;  Laterality: Left;  . CESAREAN SECTION     x2  . COMPLEX WOUND CLOSURE Left 07/30/2020   Procedure: COMPLEX WOUND CLOSURE;  Surgeon: Cherre Robins, MD;  Location: [place];  Service: Vascular;  Laterality: Left;  . FISTULA SUPERFICIALIZATION Left 10/28/2017   Procedure: SUPERFICIALIZATION LEFT BRACHIOCEPHALIC ARTERIOVENOUS FISTULA;  Surgeon: Rosetta Posner, MD;  Location: [place];  Service: Vascular;  Laterality: Left;  . INSERTION  OF DIALYSIS CATHETER Right 06/24/2017   Procedure: INSERTION OF TUNNELED DIALYSIS CATHETER;  Surgeon: Rosetta Posner, MD;  Location: [place];  Service: Vascular;  Laterality: Right;  . INSERTION OF DIALYSIS CATHETER Right 07/30/2020   Procedure: INSERTION OF DIALYSIS CATHETER;  Surgeon: Cherre Robins, MD;  Location: [place];  Service: Vascular;  Laterality: Right;  . LIGATION OF COMPETING BRANCHES OF ARTERIOVENOUS FISTULA  10/28/2017   Procedure: LIGATION OF COMPETING BRANCHES OF ARTERIOVENOUS FISTULA x3;  Surgeon: Rosetta Posner, MD;  [facility];  Service: Vascular;;  . REVISON OF ARTERIOVENOUS FISTULA Left 07/30/2020   Procedure: LEFT UPPER EXTREMITY ARTERIOVENOUS FISTULA REVISON;  Surgeon: Cherre Robins, MD;  [facility];  Service: Vascular;  Laterality: Left;  PERIPHERAL NERVE BLOCK   Family History  Problem Relation Age of Onset  . Diabetes Father   . Hypertension Father   . Cancer Father   . Heart disease Father   . Aneurysm Mother   . Seizures Mother    Social History:  reports that she has been smoking cigarettes. She has a 3.75 pack-year smoking history. She has never used smokeless tobacco. She reports current alcohol use. She reports current drug use. Drug: Marijuana. Allergies  Allergen Reactions  . Visine [Tetrahydrozoline Hcl] Swelling    Eyes Swelling   Prior to Admission medications   Medication Sig Start Date End Date Taking? Authorizing Provider  acetaminophen (TYLENOL) 500 MG tablet Take 1,000 mg by mouth every Tuesday, Thursday, and Saturday at 6 PM. With dialysis   Yes [provider]  amLODipine (NORVASC) 10 MG tablet Take 1 tablet (10 mg total) by mouth at bedtime. 12/09/19 01/08/20 Yes Aslam, Loralyn Freshwater, MD  B Complex-C-Folic Acid (DIALYVITE TABLET) TABS Take 1 tablet by mouth daily. 09/27/19  Yes [provider]  carvedilol (COREG) 25 MG tablet Take 1 tablet (25 mg total) by mouth 2 (two) times daily with a meal. 12/09/19 01/08/20 Yes Aslam, Sadia,  MD  diphenhydrAMINE (BENADRYL) 25 MG tablet Take 25 mg by mouth every 6 (six) hours as needed for itching or sleep.   Yes [provider]  hydrALAZINE (APRESOLINE) 100 MG tablet Take 1 tablet (100 mg total) by mouth 3 (three) times daily. 12/09/19 01/08/20 Yes Aslam, Loralyn Freshwater, MD  levETIRAcetam (KEPPRA) 1000 MG tablet Take 1,000 mg by mouth daily. 06/29/20  Yes [provider]  levETIRAcetam 1000 MG TB24 Take 1,000 mg by mouth daily. 10/10/19  Yes Frann Rider, NP  sevelamer carbonate (RENVELA) 800 MG tablet Take 2 tablets (1,600 mg total) by mouth 3 (three) times daily with meals. Patient taking differently: Take 1,600 mg by mouth See admin instructions. Take 2 tablets (1600 mg) by mouth 3 times daily with meals & take 1 tablet (800 mg) by mouth with snacks 02/21/19  Yes Madalyn Rob, MD  spironolactone (ALDACTONE) 50 MG tablet Take 1 tablet (50 mg total) by mouth daily. 12/09/19  Yes Harvie Heck, MD   Current Facility-Administered Medications  Medication Dose Route Frequency Provider Last Rate Last Admin  . 0.9 %  sodium chloride infusion  100 mL Intravenous PRN Halli Equihua, Ria Comment, PA      . 0.9 %  sodium chloride infusion  100 mL Intravenous PRN Carsyn Boster, Ria Comment, PA      . acetaminophen (TYLENOL) tablet 1,000 mg  1,000 mg Oral Q T,Th,Sat-1800 Collins, Emma M, PA-C      . alteplase (CATHFLO ACTIVASE) injection 2 mg  2 mg Intracatheter Once PRN Shuntavia Yerby, Ria Comment, PA      . amLODipine (NORVASC) tablet 10 mg  10 mg Oral QHS Laurence Slate M, PA-C   10 mg at 07/30/20 2137  . carvedilol (COREG) tablet 25 mg  25 mg Oral BID WC Laurence Slate M, PA-C   25 mg at 07/30/20 1736  . Chlorhexidine Gluconate Cloth 2 % PADS 6 each  6 each Topical Q0600 Jakobee Brackins, [place]      . diphenhydrAMINE (BENADRYL) capsule 25 mg  25 mg Oral Q6H PRN Laurence Slate M, PA-C   25 mg at 07/30/20 1515  . guaiFENesin-dextromethorphan (ROBITUSSIN DM) 100-10 MG/5ML syrup 15 mL  15 mL Oral Q4H PRN Laurence Slate M,  PA-C      . heparin injection 1,000 Units  1,000 Units Dialysis PRN Amaro Mangold, Ria Comment, [place]      . heparin injection 5,000 Units  5,000 Units Subcutaneous Q8H Laurence Slate M, PA-C   5,000 Units at 07/31/20 [phone]  . hydrALAZINE (APRESOLINE) injection 5 mg  5 mg Intravenous Q20 Min PRN Laurence Slate M, PA-C      . hydrALAZINE (APRESOLINE) tablet 100 mg  100 mg Oral TID Ulyses Amor, PA-C   100 mg at 07/30/20 2137  . HYDROmorphone (DILAUDID) injection 0.5-1 mg  0.5-1 mg Intravenous Q2H PRN Laurence Slate M, PA-C   1 mg at 07/31/20 1100  . labetalol (NORMODYNE) injection 10 mg  10 mg Intravenous Q10 min PRN Ulyses Amor, PA-C   10 mg at 07/30/20 1601  . levETIRAcetam (KEPPRA XR) 24 hr tablet 1,000 mg  1,000 mg Oral  Daily Laurence Slate M, PA-C      . lidocaine (PF) (XYLOCAINE) 1 % injection 5 mL  5 mL Intradermal PRN Ronney Honeywell, Ria Comment, PA      . lidocaine-prilocaine (EMLA) cream 1 application  1 application Topical PRN Giang Hemme, Ria Comment, PA      . metoprolol tartrate (LOPRESSOR) injection 2-5 mg  2-5 mg Intravenous Q2H PRN Laurence Slate M, PA-C      . ondansetron [facility]) injection 4 mg  4 mg Intravenous Q6H PRN Laurence Slate M, PA-C   4 mg at 07/30/20 2357  . oxyCODONE-acetaminophen (PERCOCET/ROXICET) 5-325 MG per tablet 1-2 tablet  1-2 tablet Oral Q4H PRN Ulyses Amor, PA-C   2 tablet at 07/31/20 0751  . pantoprazole (PROTONIX) EC tablet 40 mg  40 mg Oral Daily Collins, Emma M, PA-C      . pentafluoroprop-tetrafluoroeth (GEBAUERS) aerosol 1 application  1 application Topical PRN Ephrem Carrick, [place], [place]      . phenol (CHLORASEPTIC) mouth spray 1 spray  1 spray Mouth/Throat PRN Laurence Slate M, PA-C      . sevelamer carbonate (RENVELA) tablet 1,600 mg  1,600 mg Oral TID WC Laurence Slate M, PA-C   1,600 mg at 07/31/20 1015  . sodium chloride flush (NS) 0.9 % injection 3 mL  3 mL Intravenous Q12H Laurence Slate M, PA-C   3 mL at 07/30/20 2138  . sodium chloride flush (NS) 0.9 % injection 3 mL  3 mL  Intravenous PRN Laurence Slate M, PA-C      . spironolactone (ALDACTONE) tablet 50 mg  50 mg Oral Daily Laurence Slate M, PA-C   50 mg at 07/30/20 1834   Labs: Basic Metabolic Panel: Recent Labs  Lab 07/25/20 0934 07/30/20 0822 07/30/20 1809  NA 140 138 136  K 4.2 3.8 4.3  CL 99 100 97*  CO2  --   --  25  GLUCOSE 105* 95 114*  BUN 53* 51* 59*  CREATININE 6.70* 8.70* 8.73*  CALCIUM  --   --  9.1   Liver Function Tests: Recent Labs  Lab 07/30/20 1809  AST 14*  ALT 11  ALKPHOS 35*  BILITOT 0.6  PROT 6.3*  ALBUMIN 3.5   CBC: Recent Labs  Lab 07/30/20 0822 07/30/20 1436 07/30/20 1809  WBC  --  7.1 8.3  HGB 10.5* 8.5* 8.7*  HCT 31.0* 26.9* 27.1*  MCV  --  105.5* 103.8*  PLT  --  117* 114*   Studies/Results: DG Chest Port 1V same Day  Result Date: 07/30/2020 CLINICAL DATA:  Postop. EXAM: PORTABLE CHEST 1 VIEW COMPARISON:  12/07/2019 radiograph and CT chest. FINDINGS: Right IJ dialysis catheter tip is in the low SVC or at the SVC RA junction. Pneumothorax. Streaky densities are seen in the medial lung bases. Lungs are otherwise clear. Tiny bilateral pleural effusions. IMPRESSION: 1. Right IJ dialysis catheter placement without pneumothorax. 2. Volume loss in the medial aspects of both lower lobes. Difficult to exclude pneumonia. 3. Tiny bilateral pleural effusions. Electronically Signed   By: Lorin Picket M.D.   On: 07/30/2020 13:17   HYBRID OR IMAGING ([facility] ONLY)  Result Date: 07/30/2020 There is no interpretation for this exam.  This order is for images obtained during a surgical procedure.  Please See "Surgeries" Tab for more information regarding the procedure.    ROS: All others negative except those listed in HPI.  Physical Exam: Vitals:   07/30/20 2125 07/31/20 0223 07/31/20 0526 07/31/20 1054  BP: 132/87 121/83 112/77 122/81  Pulse: 69 63 67 81  Resp: 14 16 16 17   Temp: 97.7 F (36.5 C) 97.9 F (36.6 C) 98.5 F (36.9 C) 98.1 F (36.7 C)  TempSrc:       SpO2: 95% 95% 92% 90%  Weight:      Height:         General: WDWN female in NAD Head: NCAT sclera not icteric MMM Neck: Supple. No JVD. Lungs: CTA bilaterally. Breath sounds decreased in bases. No wheeze, rales or rhonchi. Breathing is unlabored on RA. Heart: RRR. +8/0 systolic murmur, No rubs or gallops.  Abdomen: soft, nontender, +BS, no guarding, no rebound tenderness Lower extremities:no edema, ischemic changes, or open wounds  Neuro: AAOx3. Moves all extremities spontaneously. Psych:  Responds to questions appropriately with a normal affect. Dialysis Access:  LU AVF wrapped, R IJ TDC  Dialysis Orders:  TTS - [place]  3.75hrs, BFR 350, DFR 500,  EDW 56kg, 2K/ 2Ca  Access: LU AVF, TDC  Heparin none Mircera 30 mcg q2wks - last 2/8 Hectorol 23mcg IV qHD   Assessment/Plan: 1.  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 Luan Pulling on 2/14d/t concern for high output CHF. Significant pain and some oozing overnight.  Per [facility].  2.  ESRD -  On HD TTS.  Orders written for HD today per regular schedule. K 4.3 3.  Hypertension/volume  - Blood pressure well controlled.  Does not appear volume overloaded on exam. UF to dry weight.  4.  Anemia of CKD - Hgb drop 10.5>8.5 post surgery.  ESA recently dosed. Will increase next dose.   5.  Secondary Hyperparathyroidism -  Ca at goal.  Will check phos.  Continue VDRA and binders.  6.  Nutrition - Renal diet w/fluid restrictions. Alb 3.5.  7. Seizure d/o - continue home meds  Jen Mow, PA-C [facility] 07/31/2020, 11:49 AM

## 2020-07-31 NOTE — Plan of Care (Signed)
  Problem: Education: Goal: Knowledge of General Education information will improve Description: Including pain rating scale, medication(s)/side effects and non-pharmacologic comfort measures Outcome: Completed/Met   Problem: Clinical Measurements: Goal: Diagnostic test results will improve Outcome: Completed/Met Goal: Respiratory complications will improve Outcome: Completed/Met Goal: Cardiovascular complication will be avoided Outcome: Completed/Met   

## 2020-07-31 NOTE — Progress Notes (Signed)
Pt.'s Hgb 6.3, Nephrologist ordered 2 Unit PRBC. Pt has no s/s of bleeding and NAD

## 2020-07-31 NOTE — TOC Initial Note (Signed)
Transition of Care [facility]) - Initial/Assessment Note    Patient Details  Name: Madeline Brown MRN: [id number] Date of Birth: Dec 21, 1975  [facility]) CM/SW Contact:    Bartholomew Crews, RN Phone Number: [phone] 07/31/2020, 4:10 PM  Clinical Narrative:                  Spoke with patient at the bedside. No home health or DME needs identified. Has BP cuff at home for self monitoring.  Stated that she will have transportation home at time of discharge. TOC following for transition needs.   Expected Discharge Plan: Home/Self Care Barriers to Discharge: Continued Medical Work up   Patient Goals and CMS Choice Patient states their goals for this hospitalization and ongoing recovery are:: return home CMS Medicare.gov Compare Post Acute Care list provided to:: Patient Choice offered to / list presented to : NA  Expected Discharge Plan and Services Expected Discharge Plan: Home/Self Care In-house Referral: NA Discharge Planning Services: CM Consult Post Acute Care Choice: NA                   DME Arranged: N/A DME Agency: NA       HH Arranged: NA HH Agency: NA        Prior Living Arrangements/Services     Patient language and need for interpreter reviewed:: Yes        Need for Family Participation in Patient Care: No (Comment)   Current home services: DME (BP cuff) Criminal Activity/Legal Involvement Pertinent to Current Situation/Hospitalization: No - Comment as needed  Activities of Daily Living Home Assistive Devices/Equipment: Blood pressure cuff ADL Screening (condition at time of admission) Patient's cognitive ability adequate to safely complete daily activities?: Yes Is the patient deaf or have difficulty hearing?: No Does the patient have difficulty seeing, even when wearing glasses/contacts?: No Does the patient have difficulty concentrating, remembering, or making decisions?: No Patient able to express need for assistance with ADLs?: Yes Does the  patient have difficulty dressing or bathing?: No Independently performs ADLs?: Yes (appropriate for developmental age) Does the patient have difficulty walking or climbing stairs?: No Weakness of Legs: None Weakness of Arms/Hands: None  Permission Sought/Granted                  Emotional Assessment Appearance:: Appears stated age Attitude/Demeanor/Rapport: Engaged Affect (typically observed): Accepting Orientation: : Oriented to Self,Oriented to  Time,Oriented to Place,Oriented to Situation Alcohol / Substance Use: Not Applicable Psych Involvement: No (comment)  Admission diagnosis:  ESRD (end stage renal disease) ([place]) [N18.6] Patient Active Problem List   Diagnosis Date Noted  . Fluid overload, unspecified 12/09/2019  . Acute respiratory failure with hypoxia (Home) 12/08/2019  . Hypertensive urgency 12/07/2019  . A-V fistula ([place]) 11/28/2019  . Current smoker 11/28/2019  . History of intracranial hemorrhage 11/28/2019  . Transplanted organ and tissue status, 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/15/2019  . Intracerebral hemorrhage 02/21/2019  . Intracranial hematoma ([place]) 02/20/2019  . Seizure ([place]) 02/19/2019  . Pain, unspecified 08/23/2018  . Shortness of breath 07/15/2018  . Pruritus, unspecified 10/28/2017  . Headache, unspecified 09/26/2017  . Anemia in chronic kidney disease 06/30/2017  . Coagulation defect, unspecified ([place]) 06/30/2017  . Hypertensive chronic kidney disease with stage 1 through stage 4 chronic kidney disease, or unspecified chronic kidney disease 06/30/2017  . Iron deficiency anemia, unspecified 06/30/2017  . Encounter for fitting and adjustment of extracorporeal dialysis catheter ([place]) 06/30/2017  . Secondary hyperparathyroidism of renal origin ([place]) 06/30/2017  .  ESRD (end stage renal disease) ([place]) 06/24/2017  . Hypertension 03/23/2017  . Anemia 02/12/2017   PCP:  Sonia Side., FNP Pharmacy:   [facility] [address] [place]), [place] - [address] [place] [address] Phone: [phone] Fax: [phone]  [facility] [place], [place] - [address] [place], [address] [place] [address] Phone: [phone] Fax: [phone]  [name] [place], [place] - [address] [place] [address] Phone: [phone] Fax: [phone]     Social Determinants of Health (SDOH) Interventions    Readmission Risk Interventions No flowsheet data found.

## 2020-07-31 NOTE — Plan of Care (Signed)
  Problem: Education: Goal: Knowledge of General Education information will improve Description Including pain rating scale, medication(s)/side effects and non-pharmacologic comfort measures Outcome: Progressing   

## 2020-07-31 NOTE — Procedures (Signed)
Patient seen on Hemodialysis. BP 134/76 (BP Location: Right Arm)   Pulse 75   Temp 98.2 F (36.8 C) (Oral)   Resp 10   Ht 5' (1.524 m)   Wt 59.1 kg   LMP 12/14/2017   SpO2 92%   BMI 25.45 kg/m   QB 400, UF goal 3L Tolerating treatment with complaints of left arm swelling/discomfort at this time.  Elmarie Shiley MD [facility]. Office # [phone] Pager # [phone] 12:07 PM

## 2020-07-31 NOTE — Care Management Obs Status (Signed)
[place] NOTIFICATION   Patient Details  Name: Madeline Brown MRN: [id number] Date of Birth: Feb 27, 1976   Medicare Observation Status Notification Given:  Yes    Bartholomew Crews, RN 07/31/2020, 4:05 PM

## 2020-08-01 DIAGNOSIS — I120 Hypertensive chronic kidney disease with stage 5 chronic kidney disease or end stage renal disease: Secondary | ICD-10-CM | POA: Diagnosis not present

## 2020-08-01 LAB — CBC
HCT: 27.5 % — ABNORMAL LOW (ref 36.0–46.0)
Hemoglobin: 9.4 g/dL — ABNORMAL LOW (ref 12.0–15.0)
MCH: 33.3 pg (ref 26.0–34.0)
MCHC: 34.2 g/dL (ref 30.0–36.0)
MCV: 97.5 fL (ref 80.0–100.0)
Platelets: 94 10*3/uL — ABNORMAL LOW (ref 150–400)
RBC: 2.82 MIL/uL — ABNORMAL LOW (ref 3.87–5.11)
RDW: 14.8 % (ref 11.5–15.5)
WBC: 7.5 10*3/uL (ref 4.0–10.5)
nRBC: 0.3 % — ABNORMAL HIGH (ref 0.0–0.2)

## 2020-08-01 LAB — BPAM RBC
Blood Product Expiration Date: 202203072359
Blood Product Expiration Date: 202203102359
ISSUE DATE / TIME: 202202151412
ISSUE DATE / TIME: 202202151412
Unit Type and Rh: 7300
Unit Type and Rh: 7300

## 2020-08-01 LAB — TYPE AND SCREEN
ABO/RH(D): B POS
Antibody Screen: NEGATIVE
Unit division: 0
Unit division: 0

## 2020-08-01 MED ORDER — OXYCODONE-ACETAMINOPHEN 5-325 MG PO TABS: 1.0000 | ORAL_TABLET | ORAL | 0 refills | Status: DC | PRN

## 2020-08-01 NOTE — Discharge Instructions (Signed)
° °  [facility] ° °Discharge Instructions ° °AV Fistula or Graft Surgery for Dialysis Access ° °Please refer to the following instructions for your post-procedure care. Your surgeon or physician assistant will discuss any changes with you. ° °Activity ° °You may drive the day following your surgery, if you are comfortable and no longer taking prescription pain medication. Resume full activity as the soreness in your incision resolves. ° °Bathing/Showering ° °You may shower after you go home. Keep your incision dry for 48 hours. Do not soak in a bathtub, hot tub, or swim until the incision heals completely. You may not shower if you have a hemodialysis catheter. ° °Incision Care ° °Clean your incision with mild soap and water after 48 hours. Pat the area dry with a clean towel. You do not need a bandage unless otherwise instructed. Do not apply any ointments or creams to your incision. You may have skin glue on your incision. Do not peel it off. It will come off on its own in about one week. Your arm may swell a bit after surgery. To reduce swelling use pillows to elevate your arm so it is above your heart. Your doctor will tell you if you need to lightly wrap your arm with an ACE bandage. ° °Diet ° °Resume your normal diet. There are not special food restrictions following this procedure. In order to heal from your surgery, it is CRITICAL to get adequate nutrition. Your body requires vitamins, minerals, and protein. Vegetables are the best source of vitamins and minerals. Vegetables also provide the perfect balance of protein. Processed food has little nutritional value, so try to avoid this. ° °Medications ° °Resume taking all of your medications. If your incision is causing pain, you may take over-the counter pain relievers such as acetaminophen (Tylenol). If you were prescribed a stronger pain medication, please be aware these medications can cause nausea and constipation. Prevent  nausea by taking the medication with a snack or meal. Avoid constipation by drinking plenty of fluids and eating foods with high amount of fiber, such as fruits, vegetables, and grains. Do not take Tylenol if you are taking prescription pain medications. ° ° ° ° °Follow up °Your surgeon may want to see you in the office following your access surgery. If so, this will be arranged at the time of your surgery. ° °Please call us immediately for any of the following conditions: ° °Increased pain, redness, drainage (pus) from your incision site °Fever of 101 degrees or higher °Severe or worsening pain at your incision site °Hand pain or numbness. ° °Reduce your risk of vascular disease: ° °Stop smoking. If you would like help, call [facility] at 1-800-QUIT-NOW (1-800-784-8669) or [place] at [phone] ° °Manage your cholesterol °Maintain a desired weight °Control your diabetes °Keep your blood pressure down ° °Dialysis ° °It will take several weeks to several months for your new dialysis access to be ready for use. Your surgeon will determine when it is OK to use it. Your nephrologist will continue to direct your dialysis. You can continue to use your Permcath until your new access is ready for use. ° °If you have any questions, please call the office at [phone]. ° °

## 2020-08-01 NOTE — Progress Notes (Addendum)
Progress Note    08/01/2020 8:12 AM 2 Days Post-Op  Subjective:  She says her left arm swelling has improved and still feels some residual numbness of the 4th and 5th fingers   Vitals:   07/31/20 2014 08/01/20 0535  BP: 114/81 (!) 149/98  Pulse: 83 80  Resp: 20 20  Temp: 98.7 F (37.1 C) 98.7 F (37.1 C)  SpO2: 94% 93%    Physical Exam: General appearance: Awake, alert in no apparent distress Cardiac: Heart rate and rhythm are regular Respirations: Nonlabored Incisions: Left upper arm staple line intact with minimal oozing. Has developed several bullous lesions. Extremities: Left grip srtength is 5/5> hand warm; sensation slightly diminished on distal left 4th and 5th digits     CBC    Component Value Date/Time   WBC 6.3 07/31/2020 1207   RBC 1.87 (L) 07/31/2020 1207   HGB 6.3 (LL) 07/31/2020 1207   HGB 10.5 (L) 02/25/2017 1103   HCT 18.8 (L) 07/31/2020 1207   HCT 35.1 02/25/2017 1103   PLT 95 (L) 07/31/2020 1207   PLT 314 02/25/2017 1103   MCV 100.5 (H) 07/31/2020 1207   MCV 82 02/25/2017 1103   MCH 33.7 07/31/2020 1207   MCHC 33.5 07/31/2020 1207   RDW 13.3 07/31/2020 1207   RDW 24.9 (H) 02/25/2017 1103   LYMPHSABS 1.4 12/07/2019 1743   LYMPHSABS 1.0 02/25/2017 1103   MONOABS 0.4 12/07/2019 1743   EOSABS 0.2 12/07/2019 1743   EOSABS 0.1 02/25/2017 1103   BASOSABS 0.0 12/07/2019 1743   BASOSABS 0.0 02/25/2017 1103    BMET    Component Value Date/Time   NA 129 (L) 07/31/2020 1207   NA 142 02/25/2017 1103   K 4.5 07/31/2020 1207   CL 89 (L) 07/31/2020 1207   CO2 26 07/31/2020 1207   GLUCOSE 110 (H) 07/31/2020 1207   BUN 67 (H) 07/31/2020 1207   BUN 42 (H) 02/25/2017 1103   CREATININE 9.51 (H) 07/31/2020 1207   CALCIUM 9.0 07/31/2020 1207   GFRNONAA 5 (L) 07/31/2020 1207   GFRAA 9 (L) 12/09/2019 0742     Intake/Output Summary (Last 24 hours) at 08/01/2020 0812 Last data filed at 08/01/2020 0810 Gross per 24 hour  Intake 1015 ml  Output 3000  ml  Net -1985 ml    HOSPITAL MEDICATIONS Scheduled Meds: . sodium chloride   Intravenous Once  . acetaminophen  1,000 mg Oral Q T,Th,Sat-1800  . amLODipine  10 mg Oral QHS  . carvedilol  25 mg Oral BID WC  . Chlorhexidine Gluconate Cloth  6 each Topical Q0600  . [START ON 08/02/2020] doxercalciferol  1 mcg Intravenous Q T,Th,Sa-HD  . heparin  5,000 Units Subcutaneous Q8H  . hydrALAZINE  100 mg Oral TID  . levETIRAcetam  1,000 mg Oral Daily  . pantoprazole  40 mg Oral Daily  . sevelamer carbonate  1,600 mg Oral TID WC  . sodium chloride flush  3 mL Intravenous Q12H  . spironolactone  50 mg Oral Daily   Continuous Infusions: PRN Meds:.diphenhydrAMINE, guaiFENesin-dextromethorphan, hydrALAZINE, HYDROmorphone (DILAUDID) injection, labetalol, metoprolol tartrate, ondansetron, oxyCODONE-acetaminophen, phenol, sodium chloride flush  Assessment and Plan: POD #2 Revision/plication left UE AV fistula secondary to enlargement and possible cardiac compromise. Dry dressing reapplied  Acute blood loss anemia s/p 2 u PRBCs yesterday during HD. No new labs this am.  HD via right IJ TDC without complications.  Addendum: Post-op transfusion H and H = 9.4 and 27.5. Good response to transfusion. Will d/c home.  Risa Grill, PA-C [facility] [phone] 08/01/2020  8:12 AM   VASCULAR STAFF ADDENDUM: I have independently interviewed and examined the patient. I agree with the above.  Looks good. Wants to go home. Check CBC post transfusion. Some bullous change to arm. Needs continued local care. Will set up home health. Follow up with me in 1 week for wound check.  Yevonne Aline. Stanford Breed, MD [facility] Phone Number: [phone] 08/01/2020 8:36 AM

## 2020-08-01 NOTE — Progress Notes (Signed)
[facility] Progress Note   Subjective:     [name] was seen and examined today at bedside. She is POD2 from Revision/Plication LUE AV Fistula. Per previous note, patient's hemoglobin dropped to 6.3 during scheduled hemodialysis session. She received 2 units PRBCs during HD treatment. Currently, hemoglobin now at 9.4. She has no issues/concerns at this time. She is sitting up in bed and ready to go home. Denies shortness of breath, chest pain, N/V/D.  Objective Vitals:   07/31/20 1601 07/31/20 2014 08/01/20 0535 08/01/20 0922  BP: 120/82 114/81 (!) 149/98 130/89  Pulse: 87 83 80 90  Resp: 18 20 20 17   Temp: 98.2 F (36.8 C) 98.7 F (37.1 C) 98.7 F (37.1 C) 98.2 F (36.8 C)  TempSrc:   Oral   SpO2: 98% 94% 93% 92%  Weight:      Height:       Physical Exam General: Well developed female; appears comfortable, no acute distress Heart: Normal S1 and S2; No murmurs, gallops, or friction rub Lungs: On RA; Unlabored; Lungs clear throughout w/o wheezing, rales, rhonchi Abdomen: Soft, round, non-tender/non-distended, active bowel sounds Extremities: LUE with ACE wrap s/p revision/plication LUE AVF; No edema bilateral lower extremities Dialysis Access: R IJ TDC w/o complications   Filed Weights   07/30/20 5009 07/31/20 1140 07/31/20 1522  Weight: 56.2 kg 59.1 kg 56.2 kg    Intake/Output Summary (Last 24 hours) at 08/01/2020 1100 Last data filed at 08/01/2020 0810 Gross per 24 hour  Intake 1015 ml  Output 3000 ml  Net -1985 ml    Additional Objective Labs: Basic Metabolic Panel: Recent Labs  Lab 07/30/20 0822 07/30/20 1809 07/31/20 1207  NA 138 136 129*  K 3.8 4.3 4.5  CL 100 97* 89*  CO2  --  25 26  GLUCOSE 95 114* 110*  BUN 51* 59* 67*  CREATININE 8.70* 8.73* 9.51*  CALCIUM  --  9.1 9.0  PHOS  --   --  7.1*   Liver Function Tests: Recent Labs  Lab 07/30/20 1809 07/31/20 1207  AST 14*  --   ALT 11  --   ALKPHOS 35*  --   BILITOT 0.6  --    PROT 6.3*  --   ALBUMIN 3.5 3.3*   CBC: Recent Labs  Lab 07/30/20 1436 07/30/20 1809 07/31/20 1207 08/01/20 0957  WBC 7.1 8.3 6.3 7.5  HGB 8.5* 8.7* 6.3* 9.4*  HCT 26.9* 27.1* 18.8* 27.5*  MCV 105.5* 103.8* 100.5* 97.5  PLT 117* 114* 95* 94*   Blood Culture    Component Value Date/Time   SDES URINE, RANDOM 02/19/2019 1538   SPECREQUEST  02/19/2019 1538    NONE Performed at [facility], [place] [address], [place], [place] [address]    CULT (A) 02/19/2019 1538    40,000 COLONIES/mL MULTIPLE SPECIES PRESENT, SUGGEST RECOLLECTION   REPTSTATUS 02/20/2019 FINAL 02/19/2019 1538    CBG: No results for input(s): GLUCAP in the last 168 hours. Iron Studies: No results for input(s): IRON, TIBC, TRANSFERRIN, FERRITIN in the last 72 hours. Lab Results  Component Value Date   INR 1.14 06/24/2017   INR 1.07 06/19/2017   INR 1.07 02/13/2017   Studies/Results: DG Chest Port 1V same Day  Result Date: 07/30/2020 CLINICAL DATA:  Postop. EXAM: PORTABLE CHEST 1 VIEW COMPARISON:  12/07/2019 radiograph and CT chest. FINDINGS: Right IJ dialysis catheter tip is in the low SVC or at the SVC RA junction. Pneumothorax. Streaky densities are seen in  the medial lung bases. Lungs are otherwise clear. Tiny bilateral pleural effusions. IMPRESSION: 1. Right IJ dialysis catheter placement without pneumothorax. 2. Volume loss in the medial aspects of both lower lobes. Difficult to exclude pneumonia. 3. Tiny bilateral pleural effusions. Electronically Signed   By: Lorin Picket M.D.   On: 07/30/2020 13:17    Medications:  . sodium chloride   Intravenous Once  . acetaminophen  1,000 mg Oral Q T,Th,Sat-1800  . amLODipine  10 mg Oral QHS  . carvedilol  25 mg Oral BID WC  . Chlorhexidine Gluconate Cloth  6 each Topical Q0600  . [START ON 08/02/2020] doxercalciferol  1 mcg Intravenous Q T,Th,Sa-HD  . heparin  5,000 Units Subcutaneous Q8H  . hydrALAZINE  100 mg Oral TID  . levETIRAcetam  1,000 mg  Oral Daily  . pantoprazole  40 mg Oral Daily  . sevelamer carbonate  1,600 mg Oral TID WC  . sodium chloride flush  3 mL Intravenous Q12H  . spironolactone  50 mg Oral Daily    Dialysis Orders: T/T/S: [place] 3.75hrs, BFR 350, DFR 500,  EDW 56kg, 2K/2Ca  Assessment/Plan: 1.  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 Luan Pulling on 2/14 d/t concern for high output CHF. LUE with ACE Wrap. Patient denies pain at this time. Per [facility] previous note, vascular surgery to set up home health and F/U in 1 week for wound care check.  2.  ESRD -  HD on TTS schedule; patient received 2 units PRBC during yesterday's treatment 07/31/20. Otherwise, she tolerated the session. 3.  Hypertension/volume  - Blood pressure well controlled.  Does not appear volume overloaded on exam. UF to dry weight.  4.  Anemia of CKD - Hgb dropped to 6.3 on HD. Received 2 units PRBCs. Hgb improved to 9.4. Currently not showing signs for bleeding.   5.  Secondary Hyperparathyroidism -  Ca at goal.  Last PO4 7.1.  PTH from OP 407. Continue VDRA and binders.  6.  Nutrition - Renal diet w/fluid restrictions. Alb 3.3.  7. Seizure d/o - continue home meds   Tobie Poet, NP [facility] 08/01/2020,11:00 AM  LOS: 0 days

## 2020-08-01 NOTE — Plan of Care (Signed)
  Problem: Health Behavior/Discharge Planning: Goal: Ability to manage health-related needs will improve 08/01/2020 1239 by Vira Agar, RN Outcome: Progressing 08/01/2020 1238 by Vira Agar, RN Outcome: Progressing   Problem: Activity: Goal: Risk for activity intolerance will decrease Outcome: Progressing   Problem: Pain Managment: Goal: General experience of comfort will improve 08/01/2020 1239 by Vira Agar, RN Outcome: Progressing 08/01/2020 1238 by Vira Agar, RN Outcome: Progressing   Problem: Safety: Goal: Ability to remain free from injury will improve Outcome: Progressing   Problem: Skin Integrity: Goal: Risk for impaired skin integrity will decrease Outcome: Progressing

## 2020-08-01 NOTE — Progress Notes (Signed)
DISCHARGE NOTE HOME [name] to be discharged Home per MD order. Discussed prescriptions and follow up appointments with the patient. Prescriptions given to patient; medication list explained in detail. Patient verbalized understanding.  Skin clean, dry and intact without evidence of skin break down, no evidence of skin tears noted. IV catheter discontinued intact. Site without signs and symptoms of complications. Dressing and pressure applied. Pt denies pain at the site currently. No complaints noted.  Patient free of lines, drains, and wounds.   An After Visit Summary (AVS) was printed and given to the patient. Patient escorted via wheelchair, and discharged home via private auto.  Vira Agar, RN

## 2020-08-03 NOTE — Discharge Summary (Signed)
Discharge Summary  Patient ID: Madeline Brown [id number] 45 y.o. 1976-06-03  Admit date: 07/30/2020  Discharge date and time: 08/01/2020  1:20 PM   Admitting Physician: Cherre Robins, MD   Discharge Physician: Jamelle Haring, MD  Admission Diagnoses: ESRD (end stage renal disease) ([place]) [N18.6]  Discharge Diagnoses: ESRD; intra-op bleeding  Admission Condition: good  Discharged Condition: good  Indication for Admission: ESRD with high flow (>4L/min) through aneursymal LUE AVF She has a large, aneurysmal LUE AVF with two areas of hypopigmentation. Her cardiologist is concerned that amount of flow through a fistula may induce heart failure.  Hospital Course: Patient was taken to the operating room and underwent the procedure as outlined below.  Was estimated at 300 mL.  She was kept overnight for observation and pain control.  Her nephrology team team was consulted. Postoperative day 1, she was complaining of significant pain however she had no further bleeding.  Her postoperative blood count revealed a hemoglobin of 6.3 and she received 2 units packed cells during dialysis.  She was dialyzed without complications via TDC. On postoperative day 2, she had no further bleeding and her hemoglobin responded and was 9.4 g/dL on recheck. Her vital signs were stable and her hand was well perfused.  Consults: nephrology  Treatments: surgery: PROCEDURE:   1) Placement of tunneled dialysis catheter (RIJ 19cm) 2) Extensive excision and plication of left upper extremity brachio-cephalic arterio-venous fistula 3) Complex wound closure (28 x 8cm)  Discharge Exam:  General appearance: Awake, alert in no apparent distress Cardiac: Heart rate and rhythm are regular Respirations: Nonlabored Incisions: Left upper arm staple line intact with minimal oozing. Has developed several bullous lesions. Extremities: Left grip srtength is 5/5> hand warm; sensation slightly diminished on distal left 4th and  5th digits  Vitals:   08/01/20 0535 08/01/20 0922  BP: (!) 149/98 130/89  Pulse: 80 90  Resp: 20 17  Temp: 98.7 F (37.1 C) 98.2 F (36.8 C)  SpO2: 93% 92%   Disposition: Discharge disposition: 06-Home-Health Care Svc       Patient Instructions:  Allergies as of 08/01/2020      Reactions   Visine [tetrahydrozoline Hcl] Swelling   Eyes Swelling      Medication List    TAKE these medications   acetaminophen 500 MG tablet Commonly known as: TYLENOL Take 1,000 mg by mouth every Tuesday, Thursday, and Saturday at 6 PM. With dialysis   amLODipine 10 MG tablet Commonly known as: NORVASC Take 1 tablet (10 mg total) by mouth at bedtime.   carvedilol 25 MG tablet Commonly known as: COREG Take 1 tablet (25 mg total) by mouth 2 (two) times daily with a meal.   DIALYVITE TABLET Tabs Take 1 tablet by mouth daily.   diphenhydrAMINE 25 MG tablet Commonly known as: BENADRYL Take 25 mg by mouth every 6 (six) hours as needed for itching or sleep.   hydrALAZINE 100 MG tablet Commonly known as: APRESOLINE Take 1 tablet (100 mg total) by mouth 3 (three) times daily.   levETIRAcetam ER 1000 MG Tb24 Take 1,000 mg by mouth daily.   levETIRAcetam 1000 MG tablet Commonly known as: KEPPRA Take 1,000 mg by mouth daily.   oxyCODONE-acetaminophen 5-325 MG tablet Commonly known as: PERCOCET/ROXICET Take 1-2 tablets by mouth every 4 (four) hours as needed for moderate pain.   sevelamer carbonate 800 MG tablet Commonly known as: RENVELA Take 2 tablets (1,600 mg total) by mouth 3 (three) times daily with meals. What changed:  when to take this  additional instructions   spironolactone 50 MG tablet Commonly known as: ALDACTONE Take 1 tablet (50 mg total) by mouth daily.      Activity: activity as tolerated Diet: renal diet Wound Care: keep wound clean and dry  Follow-up with Dr. Stanford Breed in 1 week.  Signed: Barbie Banner, PA-C 08/03/2020 1:24 PM [facility] Office:  [phone]

## 2020-08-06 ENCOUNTER — Other Ambulatory Visit: Payer: Self-pay

## 2020-08-06 ENCOUNTER — Encounter (HOSPITAL_COMMUNITY): Payer: Self-pay | Admitting: Emergency Medicine

## 2020-08-06 ENCOUNTER — Inpatient Hospital Stay (HOSPITAL_COMMUNITY)
Admission: EM | Admit: 2020-08-06 | Discharge: 2020-08-09 | DRG: 193 | Disposition: A | Discharge status: Home Health | Payer: Medicare HMO | Attending: Internal Medicine | Admitting: Internal Medicine

## 2020-08-06 ENCOUNTER — Emergency Department (HOSPITAL_COMMUNITY): Payer: Medicare HMO

## 2020-08-06 ENCOUNTER — Other Ambulatory Visit: Payer: Self-pay | Admitting: Physician Assistant

## 2020-08-06 DIAGNOSIS — N189 Chronic kidney disease, unspecified: Secondary | ICD-10-CM

## 2020-08-06 DIAGNOSIS — D631 Anemia in chronic kidney disease: Secondary | ICD-10-CM

## 2020-08-06 DIAGNOSIS — F17210 Nicotine dependence, cigarettes, uncomplicated: Secondary | ICD-10-CM | POA: Diagnosis present

## 2020-08-06 DIAGNOSIS — G40909 Epilepsy, unspecified, not intractable, without status epilepticus: Secondary | ICD-10-CM

## 2020-08-06 DIAGNOSIS — R0902 Hypoxemia: Secondary | ICD-10-CM

## 2020-08-06 DIAGNOSIS — D696 Thrombocytopenia, unspecified: Secondary | ICD-10-CM | POA: Diagnosis present

## 2020-08-06 DIAGNOSIS — Z8249 Family history of ischemic heart disease and other diseases of the circulatory system: Secondary | ICD-10-CM

## 2020-08-06 DIAGNOSIS — K5909 Other constipation: Secondary | ICD-10-CM | POA: Diagnosis present

## 2020-08-06 DIAGNOSIS — R0602 Shortness of breath: Secondary | ICD-10-CM | POA: Diagnosis not present

## 2020-08-06 DIAGNOSIS — I10 Essential (primary) hypertension: Secondary | ICD-10-CM | POA: Diagnosis present

## 2020-08-06 DIAGNOSIS — J9601 Acute respiratory failure with hypoxia: Secondary | ICD-10-CM | POA: Diagnosis present

## 2020-08-06 DIAGNOSIS — Z20822 Contact with and (suspected) exposure to covid-19: Secondary | ICD-10-CM | POA: Diagnosis present

## 2020-08-06 DIAGNOSIS — J189 Pneumonia, unspecified organism: Secondary | ICD-10-CM | POA: Diagnosis not present

## 2020-08-06 DIAGNOSIS — R202 Paresthesia of skin: Secondary | ICD-10-CM

## 2020-08-06 DIAGNOSIS — M7989 Other specified soft tissue disorders: Secondary | ICD-10-CM

## 2020-08-06 DIAGNOSIS — Z79899 Other long term (current) drug therapy: Secondary | ICD-10-CM

## 2020-08-06 DIAGNOSIS — Z888 Allergy status to other drugs, medicaments and biological substances status: Secondary | ICD-10-CM

## 2020-08-06 DIAGNOSIS — I120 Hypertensive chronic kidney disease with stage 5 chronic kidney disease or end stage renal disease: Secondary | ICD-10-CM | POA: Diagnosis present

## 2020-08-06 DIAGNOSIS — J811 Chronic pulmonary edema: Secondary | ICD-10-CM | POA: Diagnosis present

## 2020-08-06 DIAGNOSIS — F1290 Cannabis use, unspecified, uncomplicated: Secondary | ICD-10-CM | POA: Diagnosis present

## 2020-08-06 DIAGNOSIS — N186 End stage renal disease: Secondary | ICD-10-CM | POA: Diagnosis present

## 2020-08-06 DIAGNOSIS — Z992 Dependence on renal dialysis: Secondary | ICD-10-CM | POA: Diagnosis present

## 2020-08-06 DIAGNOSIS — E8770 Fluid overload, unspecified: Secondary | ICD-10-CM | POA: Diagnosis present

## 2020-08-06 DIAGNOSIS — R569 Unspecified convulsions: Secondary | ICD-10-CM

## 2020-08-06 LAB — COMPREHENSIVE METABOLIC PANEL
ALT: 8 U/L (ref 0–44)
AST: 22 U/L (ref 15–41)
Albumin: 3.5 g/dL (ref 3.5–5.0)
Alkaline Phosphatase: 40 U/L (ref 38–126)
Anion gap: 15 (ref 5–15)
BUN: 52 mg/dL — ABNORMAL HIGH (ref 6–20)
CO2: 24 mmol/L (ref 22–32)
Calcium: 9.7 mg/dL (ref 8.9–10.3)
Chloride: 93 mmol/L — ABNORMAL LOW (ref 98–111)
Creatinine, Ser: 7.73 mg/dL — ABNORMAL HIGH (ref 0.44–1.00)
GFR, Estimated: 6 mL/min — ABNORMAL LOW (ref >60)
Glucose, Bld: 109 mg/dL — ABNORMAL HIGH (ref 70–99)
Potassium: 3.9 mmol/L (ref 3.5–5.1)
Sodium: 132 mmol/L — ABNORMAL LOW (ref 135–145)
Total Bilirubin: 1.3 mg/dL — ABNORMAL HIGH (ref 0.3–1.2)
Total Protein: 7.1 g/dL (ref 6.5–8.1)

## 2020-08-06 LAB — RESP PANEL BY RT-PCR (FLU A&B, COVID) ARPGX2
Influenza A by PCR: NEGATIVE
Influenza B by PCR: NEGATIVE
SARS Coronavirus 2 by RT PCR: NEGATIVE

## 2020-08-06 LAB — CBC WITH DIFFERENTIAL/PLATELET
Abs Immature Granulocytes: 0.04 10*3/uL (ref 0.00–0.07)
Basophils Absolute: 0 10*3/uL (ref 0.0–0.1)
Basophils Relative: 0 %
Eosinophils Absolute: 0.3 10*3/uL (ref 0.0–0.5)
Eosinophils Relative: 3 %
HCT: 28.6 % — ABNORMAL LOW (ref 36.0–46.0)
Hemoglobin: 9.5 g/dL — ABNORMAL LOW (ref 12.0–15.0)
Immature Granulocytes: 1 %
Lymphocytes Relative: 9 %
Lymphs Abs: 0.6 10*3/uL — ABNORMAL LOW (ref 0.7–4.0)
MCH: 33.2 pg (ref 26.0–34.0)
MCHC: 33.2 g/dL (ref 30.0–36.0)
MCV: 100 fL (ref 80.0–100.0)
Monocytes Absolute: 0.4 10*3/uL (ref 0.1–1.0)
Monocytes Relative: 6 %
Neutro Abs: 6 10*3/uL (ref 1.7–7.7)
Neutrophils Relative %: 81 %
Platelets: 103 10*3/uL — ABNORMAL LOW (ref 150–400)
RBC: 2.86 MIL/uL — ABNORMAL LOW (ref 3.87–5.11)
RDW: 14.3 % (ref 11.5–15.5)
WBC: 7.3 10*3/uL (ref 4.0–10.5)
nRBC: 0 % (ref 0.0–0.2)

## 2020-08-06 LAB — I-STAT BETA HCG BLOOD, ED (MC, WL, AP ONLY): I-stat hCG, quantitative: <5 m[IU]/mL (ref <5)

## 2020-08-06 MED ORDER — OXYCODONE-ACETAMINOPHEN 5-325 MG PO TABS
1.0000 | ORAL_TABLET | Freq: Once | ORAL | Status: AC
  Administered 2020-08-06: 1 via ORAL
  Filled 2020-08-06: qty 1

## 2020-08-06 MED ORDER — OXYCODONE-ACETAMINOPHEN 5-325 MG PO TABS
2.0000 | ORAL_TABLET | Freq: Once | ORAL | Status: AC
  Administered 2020-08-06: 2 via ORAL
  Filled 2020-08-06: qty 2

## 2020-08-06 MED ORDER — OXYCODONE-ACETAMINOPHEN 5-325 MG PO TABS: 1.0000 | ORAL_TABLET | ORAL | 0 refills | Status: DC | PRN

## 2020-08-06 MED ORDER — CHLORHEXIDINE GLUCONATE CLOTH 2 % EX PADS
6.0000 | MEDICATED_PAD | Freq: Every day | CUTANEOUS | Status: DC
  Administered 2020-08-07 – 2020-08-09 (×3): 6 via TOPICAL

## 2020-08-06 MED ORDER — HEPARIN SODIUM (PORCINE) 1000 UNIT/ML IJ SOLN
INTRAMUSCULAR | Status: AC
  Filled 2020-08-06: qty 4

## 2020-08-06 MED FILL — OXYCODONE-APAP 5-325MG: 5-325 | 5 days supply | Qty: 30 | Fill #0

## 2020-08-06 NOTE — ED Provider Notes (Signed)
Patient was originally seen by the overnight doctor.  Nephrology has been contacted, she is pending dialysis, reevaluation and we anticipate discharge.  Mark the vascular surgery PA was able to come by and evaluate the patient's postoperative wound.  They feel it is appropriate and agree with plan for dialysis and outpatient follow-up. Physical Exam  BP (!) 155/106   Pulse (!) 102   Temp 98.9 F (37.2 C) (Oral)   Resp 19   LMP 01/06/2018   SpO2 94%   Physical Exam Vitals and nursing note reviewed.  Constitutional:      Appearance: Normal appearance.  HENT:     Head: Normocephalic.     Mouth/Throat:     Mouth: Mucous membranes are moist.  Cardiovascular:     Rate and Rhythm: Normal rate.  Pulmonary:     Effort: Pulmonary effort is normal. No respiratory distress.  Musculoskeletal:     Comments: Large left upper extremity incision from AV fistula revision, picture and previous physician's chart.  Skin:    General: Skin is warm.  Neurological:     Mental Status: She is alert and oriented to person, place, and time. Mental status is at baseline.  Psychiatric:        Mood and Affect: Mood normal.     ED Course/Procedures     Procedures  MDM  Pending dialysis, reevaluation.  Patient currently stable and in no respiratory distress.       Lorelle Gibbs, DO 08/06/20 1538

## 2020-08-06 NOTE — ED Notes (Signed)
2 RN attempted IV access, both unsuccessful. IV team consult placed

## 2020-08-06 NOTE — ED Triage Notes (Signed)
Pt had surgery last Monday on her fissula.  She has dialysis on T/Th/S, which this past Saturday being cut 45 minutes short.  Pt has great pain at her surgical site and now reports "puss draining from site."

## 2020-08-06 NOTE — ED Notes (Signed)
Patient in dialysis. Report given to [facility]. RN

## 2020-08-06 NOTE — Progress Notes (Signed)
    Postoperative Access Visit   History of Present Illness   Madeline Brown is a 45 y.o. year old female status post extensive revision of large aneurysmal left arm fistula.  She presented to the emergency department with worsening shortness of breath.  Vascular surgery is asked to evaluate surgical site.  Patient states she has had some thin orange-ish drainage from mid incision.  She admittedly has not been elevating her arm and thus she has edema in her left hand and forearm.  She has numbness and weakness in the fourth and fifth finger.  She dialyzes on a Tuesday Thursday Saturday schedule via right IJ TDC.  She denies fevers, chills, nausea/vomiting.  Patient states pain is controlled with 5/325 mg Percocet.   Physical Examination   Vitals:   08/06/20 1315 08/06/20 1330 08/06/20 1345 08/06/20 1400  BP: (!) 151/100 (!) 158/112 (!) 150/101 (!) 155/106  Pulse: (!) 104 (!) 104 (!) 102 (!) 102  Resp: 20 (!) 26 19 19   Temp:      TempSrc:      SpO2: 93% 93% 92% 94%   There is no height or weight on file to calculate BMI.  left arm  palpable left radial pulse; dependent edema left hand and forearm; left fourth fifth finger numbness and weakness; incision with staples intact and viable skin edges; no drainage with manipulation; palpable thrill throughout fistula; no sign of purulence or infection; possible small seroma mid incision; blistering similar to picture from 08/01/2020    Medical Decision Making   Kaoir Loree is a 45 y.o. year old female who presents s/p extensive revision of left AV fistula   Fistula is widely patent with a palpable thrill and left hand is well-perfused with a palpable radial pulse  Patient has dependent edema and bruising of left upper extremity; patient will be wrapped with Ace wrap from left hand up to axilla sequentially and I have encouraged her to exercise her left hand and elevate her left arm above her heart  No sign or suspicion of infection; no  artificial material used during surgery  I will refill patient's narcotic prescription for continued postoperative pain control  She will follow up in office in 1 week with likely staple removal  Okay for discharge after dialysis treatment this afternoon   Dagoberto Ligas PA-C [facility] [place] Office: [phone]

## 2020-08-06 NOTE — ED Provider Notes (Signed)
[facility] EMERGENCY DEPARTMENT Provider Note   CSN: [id number] Arrival date & time: 08/06/20  0347   History Chief Complaint  Patient presents with  . Post-op Problem    Madeline Brown is a 45 y.o. female.  The history is provided by the patient.  She has history of hypertension, end-stage renal disease on hemodialysis and comes in because of pain and swelling in her left arm.  She had a revision of her fistula done 1 week ago.  She had pain and swelling from the beginning and also had some numbness in her left ring and pinky fingers.  Pain and numbness got significantly worse over the last 2days.  She has also noted some blisters that have developed along the incision and noted some orangeish drainage from the wound.  She denies fever, chills, sweats.  She is scheduled for her dialysis tomorrow but has had some dyspnea tonight.  She does admit to having excess salt consumption in the last few days.  She denies fever or chills.  He has been taking oxycodone-acetaminophen for pain which gives temporary relief  Past Medical History:  Diagnosis Date  . Anemia   . Chronic kidney disease   . Headache, unspecified 09/26/2017  . Hypertension   . Shortness of breath 07/15/2018    Patient Active Problem List   Diagnosis Date Noted  . Fluid overload, unspecified 12/09/2019  . Acute respiratory failure with hypoxia ([place]) 12/08/2019  . Hypertensive urgency 12/07/2019  . A-V fistula ([place]) 11/28/2019  . Current smoker 11/28/2019  . History of intracranial hemorrhage 11/28/2019  . Transplanted organ and tissue status, 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/15/2019  . Intracerebral hemorrhage 02/21/2019  . Intracranial hematoma ([place]) 02/20/2019  . Seizure ([place]) 02/19/2019  . Pain, unspecified 08/23/2018  . Shortness of breath 07/15/2018  . Pruritus, unspecified 10/28/2017  . Headache, unspecified 09/26/2017  . Anemia in chronic kidney disease 06/30/2017  . Coagulation defect, unspecified ([place])  06/30/2017  . Hypertensive chronic kidney disease with stage 1 through stage 4 chronic kidney disease, or unspecified chronic kidney disease 06/30/2017  . Iron deficiency anemia, unspecified 06/30/2017  . Encounter for fitting and adjustment of extracorporeal dialysis catheter ([place]) 06/30/2017  . Secondary hyperparathyroidism of renal origin ([place]) 06/30/2017  . ESRD (end stage renal disease) ([place]) 06/24/2017  . Hypertension 03/23/2017  . Anemia 02/12/2017    Past Surgical History:  Procedure Laterality Date  . AV FISTULA PLACEMENT Left 06/24/2017   Procedure: CREATION OF LEFT ARM BRACHIALCEPHALIC  ARTERIOVENOUS (AV) FISTULA;  Surgeon: Rosetta Posner, MD;  Location: [place];  Service: Vascular;  Laterality: Left;  . CESAREAN SECTION     x2  . COMPLEX WOUND CLOSURE Left 07/30/2020   Procedure: COMPLEX WOUND CLOSURE;  Surgeon: Cherre Robins, MD;  Location: [place];  Service: Vascular;  Laterality: Left;  . FISTULA SUPERFICIALIZATION Left 10/28/2017   Procedure: SUPERFICIALIZATION LEFT BRACHIOCEPHALIC ARTERIOVENOUS FISTULA;  Surgeon: Rosetta Posner, MD;  Location: [place];  Service: Vascular;  Laterality: Left;  . INSERTION OF DIALYSIS CATHETER Right 06/24/2017   Procedure: INSERTION OF TUNNELED DIALYSIS CATHETER;  Surgeon: Rosetta Posner, MD;  Location: [place];  Service: Vascular;  Laterality: Right;  . INSERTION OF DIALYSIS CATHETER Right 07/30/2020   Procedure: INSERTION OF DIALYSIS CATHETER;  Surgeon: Cherre Robins, MD;  Location: [place];  Service: Vascular;  Laterality: Right;  . LIGATION OF COMPETING BRANCHES OF ARTERIOVENOUS FISTULA  10/28/2017   Procedure: LIGATION OF COMPETING BRANCHES OF ARTERIOVENOUS FISTULA x3;  Surgeon: Rosetta Posner, MD;  Location: [facility] OR;  Service: Vascular;;  . REVISON OF ARTERIOVENOUS FISTULA Left 07/30/2020   Procedure: LEFT UPPER EXTREMITY ARTERIOVENOUS FISTULA REVISON;  Surgeon: Cherre Robins, MD;  [facility];  Service: Vascular;  Laterality: Left;  PERIPHERAL  NERVE BLOCK     OB History    Gravida  8   Para  5   Term  5   Preterm      AB  2   Living  5     SAB      IAB  2   Ectopic      Multiple      Live Births  5           Family History  Problem Relation Age of Onset  . Diabetes Father   . Hypertension Father   . Cancer Father   . Heart disease Father   . Aneurysm Mother   . Seizures Mother     Social History   Tobacco Use  . Smoking status: Current Every Day Smoker    Packs/day: 0.25    Years: 15.00    Pack years: 3.75    Types: Cigarettes  . Smokeless tobacco: Never Used  Vaping Use  . Vaping Use: Never used  Substance Use Topics  . Alcohol use: Yes    Comment: occassional beer  . Drug use: Yes    Types: Marijuana    Home Medications Prior to Admission medications   Medication Sig Start Date End Date Taking? Authorizing Provider  acetaminophen (TYLENOL) 500 MG tablet Take 1,000 mg by mouth every Tuesday, Thursday, and Saturday at 6 PM. With dialysis    [provider]  amLODipine (NORVASC) 10 MG tablet Take 1 tablet (10 mg total) by mouth at bedtime. 12/09/19 01/08/20  Harvie Heck, MD  B Complex-C-Folic Acid (DIALYVITE TABLET) TABS Take 1 tablet by mouth daily. 09/27/19   [provider]  carvedilol (COREG) 25 MG tablet Take 1 tablet (25 mg total) by mouth 2 (two) times daily with a meal. 12/09/19 01/08/20  Harvie Heck, MD  diphenhydrAMINE (BENADRYL) 25 MG tablet Take 25 mg by mouth every 6 (six) hours as needed for itching or sleep.    [provider]  hydrALAZINE (APRESOLINE) 100 MG tablet Take 1 tablet (100 mg total) by mouth 3 (three) times daily. 12/09/19 01/08/20  Harvie Heck, MD  levETIRAcetam (KEPPRA) 1000 MG tablet Take 1,000 mg by mouth daily. 06/29/20   [provider]  levETIRAcetam 1000 MG TB24 Take 1,000 mg by mouth daily. 10/10/19   Frann Rider, NP  oxyCODONE-acetaminophen (PERCOCET/ROXICET) 5-325 MG tablet Take 1-2 tablets by mouth every 4 (four)  hours as needed for moderate pain. 08/01/20   Setzer, Edman Circle, PA-C  sevelamer carbonate (RENVELA) 800 MG tablet Take 2 tablets (1,600 mg total) by mouth 3 (three) times daily with meals. Patient taking differently: Take 1,600 mg by mouth See admin instructions. Take 2 tablets (1600 mg) by mouth 3 times daily with meals & take 1 tablet (800 mg) by mouth with snacks 02/21/19   Madalyn Rob, MD  spironolactone (ALDACTONE) 50 MG tablet Take 1 tablet (50 mg total) by mouth daily. 12/09/19   Harvie Heck, MD    Allergies    Visine [tetrahydrozoline hcl]  Review of Systems   Review of Systems  All other systems reviewed and are negative.   Physical Exam Updated Vital Signs BP (!) 177/109 (BP Location: Right Arm)   Pulse Marland Kitchen)  111   Temp 98.8 F (37.1 C) (Oral)   Resp 16   LMP 01/06/2018   SpO2 (!) 89%   Physical Exam Vitals and nursing note reviewed.   45 year old female, resting comfortably and in no acute distress. Vital signs are significant for elevated blood pressure and heart rate. Oxygen saturation is 89%, which is hypoxic. Head is normocephalic and atraumatic. PERRLA, EOMI. Oropharynx is clear. Neck is nontender and supple without adenopathy or JVD. Back is nontender and there is no CVA tenderness. Lungs are clear without rales, wheezes, or rhonchi. Chest is nontender.  Dialysis access catheters present in the right subclavian area. Heart has regular rate and rhythm without murmur. Abdomen is soft, flat, nontender without masses or hepatosplenomegaly and peristalsis is normoactive. Extremities: Long incision present on the left upper arm with staples present.  Wound appears to be healing appropriately without signs of infection.  Bullae are present on both sides of the incision.  No drainage is seen.  Moderate swelling is noted to the left upper and lower arm.  Radial pulse is 2+.  Capillary refill is prompt.  She has significantly decreased sensation over the fourth and fifth  fingers. Skin is warm and dry without rash. Neurologic: Mental status is normal, cranial nerves are intact.  Decrease sensation in the left fourth and fifth fingers as noted above.  General weakness of the intrinsic muscles of the left hand.   ED Results / Procedures / Treatments   Labs (all labs ordered are listed, but only abnormal results are displayed) Labs Reviewed  COMPREHENSIVE METABOLIC PANEL - Abnormal; Notable for the following components:      Result Value   Sodium 132 (*)    Chloride 93 (*)    Glucose, Bld 109 (*)    BUN 52 (*)    Creatinine, Ser 7.73 (*)    Total Bilirubin 1.3 (*)    GFR, Estimated 6 (*)    All other components within normal limits  CBC WITH DIFFERENTIAL/PLATELET - Abnormal; Notable for the following components:   RBC 2.86 (*)    Hemoglobin 9.5 (*)    HCT 28.6 (*)    Platelets 103 (*)    Lymphs Abs 0.6 (*)    All other components within normal limits  RESP PANEL BY RT-PCR (FLU A&B, COVID) ARPGX2  I-STAT BETA HCG BLOOD, ED ([facility], [facility], [facility] ONLY)   Radiology DG Chest Port 1 View  Result Date: 08/06/2020 CLINICAL DATA:  Shortness of breath EXAM: PORTABLE CHEST 1 VIEW COMPARISON:  Seven days ago FINDINGS: Dialysis catheter with tip at the upper cavoatrial junction. Cardiomegaly and congested appearance of hilar vessels. No definite effusion, edema, or pneumothorax. Airspace opacity at the bases asymmetric to the right. IMPRESSION: 1. Pulmonary opacity at the bases with asymmetry to the right more worrisome for pneumonia than edema. 2. Vascular congestion. Electronically Signed   By: Monte Fantasia M.D.   On: 08/06/2020 04:44    Procedures Procedures   Medications Ordered in ED Medications - No data to display  ED Course  I have reviewed the triage vital signs and the nursing notes.  Pertinent labs & imaging results that were available during my care of the patient were reviewed by me and considered in my medical decision making (see chart for  details).  MDM Rules/Calculators/A&P Left arm pain and swelling status post revision of AV fistula.  Old records are reviewed confirming revision of AV fistula done on 2/14.  Operative note describes an  extensive procedure.  Amount of swelling present does not seem out of line.  No signs of infection present.  Left ulnar nerve palsy probably secondary to swelling.  Hypoxia concerning for fluid overload.  Will check chest x-ray to see if she is showing signs of pulmonary edema.  She probably will need to have dialysis done today.  Chest x-ray is consistent with mild pulmonary edema.  Labs show stable anemia, mild hyponatremia which is not felt to be clinically significant.  Case discussed with Dr. Hollie Salk of nephrology service who agrees to do inpatient dialysis because of her hypoxia.  Final Clinical Impression(s) / ED Diagnoses Final diagnoses:  Hypoxia  Left arm swelling  Paresthesia of left arm  End-stage renal disease on hemodialysis (HCC)  Anemia associated with chronic renal failure    Rx / DC Orders ED Discharge Orders    None       Delora Fuel, MD 86/82/57 [phone]

## 2020-08-06 NOTE — Progress Notes (Addendum)
[facility] Brief Note   Ms. Urbanowicz was discharged from [facility] 08/01/20 following extensive revision and plication of her LUE AVF with complex wound closure. Presented to ED this am with continued LUE pain/numbness as well as worsening dyspnea. Per EDP notes, no signs of infection. CXR concerning for pulmonary edema. Her last dialysis was Saturday at her outpatient center, she left treatment 45 mins early and endorses worsening SOB overnight. Denies fevers, chills, cp, n/v. Nephrology asked to see for dialysis needs.   Nontoxic appearing, no respiratory distress Faint crackles at lung bases, bilaterally  LUE swelling; AVF wrapped in ACE wrap No LE edema   R IJ TDC in place dsg c,d,i  Dialysis orders: [facility] 3h55min 350/500 EDW 56kg 2K/2Ca  Will write orders for dialysis today and ask VVS to evaluate fistula while here  Full consult if admitted.   Lynnda Child PA-C [facility] 08/06/2020,12:34 PM

## 2020-08-06 NOTE — ED Notes (Signed)
Pt placed on 2L [place] for comfort, O2 sats at 94%

## 2020-08-06 NOTE — ED Provider Notes (Signed)
11:40 PM Patient brought back to the ED from dialysis.  She states that she is feeling much better.  Oxygen discontinued to ensure no hypoxia.  Will reassess.  Anticipate discharge if vitals and oxygen saturations remain stable.  She has plans to follow-up with vascular surgery next Tuesday in the office for reassessment of her dialysis fistula.  11:56 PM Patient hypoxic to 86% on room air.  She remains tachycardic.  On review, chest x-ray raises concern for pneumonia rather than edema given asymmetric opacities on x-ray.  Her most recent temperature documented at 100.3 F.  Will add blood cultures, lactate.  Plan to initiate abx and admit to medical service for suspected HCAP.  Consult placed to unassigned.   12:23 AM Case discussed with Dr. Alcario Drought of [facility] who will admit.   .Critical Care Performed by: Antonietta Breach, PA-C Authorized by: Antonietta Breach, PA-C   Critical care provider statement:    Critical care time (minutes):  45   Critical care was necessary to treat or prevent imminent or life-threatening deterioration of the following conditions:  Respiratory failure (Acute Respiratory Failure; HCAP)   Critical care was time spent personally by me on the following activities:  Discussions with consultants, evaluation of patient's response to treatment, examination of patient, ordering and performing treatments and interventions, ordering and review of laboratory studies, ordering and review of radiographic studies, pulse oximetry, re-evaluation of patient's condition, obtaining history from patient or surrogate and review of old charts       Antonietta Breach, Hershal Coria 08/07/20 Fredricka Bonine, April, MD 08/07/20 0045

## 2020-08-07 ENCOUNTER — Emergency Department (HOSPITAL_COMMUNITY): Payer: Medicare HMO

## 2020-08-07 DIAGNOSIS — I120 Hypertensive chronic kidney disease with stage 5 chronic kidney disease or end stage renal disease: Secondary | ICD-10-CM | POA: Diagnosis present

## 2020-08-07 DIAGNOSIS — J189 Pneumonia, unspecified organism: Secondary | ICD-10-CM | POA: Diagnosis present

## 2020-08-07 DIAGNOSIS — J9601 Acute respiratory failure with hypoxia: Secondary | ICD-10-CM | POA: Diagnosis present

## 2020-08-07 DIAGNOSIS — I10 Essential (primary) hypertension: Secondary | ICD-10-CM | POA: Diagnosis not present

## 2020-08-07 DIAGNOSIS — Z992 Dependence on renal dialysis: Secondary | ICD-10-CM

## 2020-08-07 DIAGNOSIS — R0602 Shortness of breath: Secondary | ICD-10-CM | POA: Diagnosis present

## 2020-08-07 DIAGNOSIS — Z20822 Contact with and (suspected) exposure to covid-19: Secondary | ICD-10-CM | POA: Diagnosis present

## 2020-08-07 DIAGNOSIS — K5909 Other constipation: Secondary | ICD-10-CM | POA: Diagnosis present

## 2020-08-07 DIAGNOSIS — Z79899 Other long term (current) drug therapy: Secondary | ICD-10-CM | POA: Diagnosis not present

## 2020-08-07 DIAGNOSIS — Z888 Allergy status to other drugs, medicaments and biological substances status: Secondary | ICD-10-CM | POA: Diagnosis not present

## 2020-08-07 DIAGNOSIS — G40909 Epilepsy, unspecified, not intractable, without status epilepticus: Secondary | ICD-10-CM | POA: Diagnosis present

## 2020-08-07 DIAGNOSIS — N186 End stage renal disease: Secondary | ICD-10-CM | POA: Diagnosis present

## 2020-08-07 DIAGNOSIS — F17210 Nicotine dependence, cigarettes, uncomplicated: Secondary | ICD-10-CM | POA: Diagnosis present

## 2020-08-07 DIAGNOSIS — D696 Thrombocytopenia, unspecified: Secondary | ICD-10-CM | POA: Diagnosis present

## 2020-08-07 DIAGNOSIS — R569 Unspecified convulsions: Secondary | ICD-10-CM

## 2020-08-07 DIAGNOSIS — J811 Chronic pulmonary edema: Secondary | ICD-10-CM | POA: Diagnosis present

## 2020-08-07 DIAGNOSIS — F1290 Cannabis use, unspecified, uncomplicated: Secondary | ICD-10-CM | POA: Diagnosis present

## 2020-08-07 DIAGNOSIS — Z8249 Family history of ischemic heart disease and other diseases of the circulatory system: Secondary | ICD-10-CM | POA: Diagnosis not present

## 2020-08-07 DIAGNOSIS — E8770 Fluid overload, unspecified: Secondary | ICD-10-CM | POA: Diagnosis present

## 2020-08-07 DIAGNOSIS — N189 Chronic kidney disease, unspecified: Secondary | ICD-10-CM | POA: Diagnosis not present

## 2020-08-07 LAB — CBC
HCT: 25.7 % — ABNORMAL LOW (ref 36.0–46.0)
HCT: 27.8 % — ABNORMAL LOW (ref 36.0–46.0)
Hemoglobin: 8.6 g/dL — ABNORMAL LOW (ref 12.0–15.0)
Hemoglobin: 9.3 g/dL — ABNORMAL LOW (ref 12.0–15.0)
MCH: 33 pg (ref 26.0–34.0)
MCH: 33.7 pg (ref 26.0–34.0)
MCHC: 33.5 g/dL (ref 30.0–36.0)
MCHC: 33.5 g/dL (ref 30.0–36.0)
MCV: 100.8 fL — ABNORMAL HIGH (ref 80.0–100.0)
MCV: 98.6 fL (ref 80.0–100.0)
Platelets: 102 10*3/uL — ABNORMAL LOW (ref 150–400)
Platelets: 121 10*3/uL — ABNORMAL LOW (ref 150–400)
RBC: 2.55 MIL/uL — ABNORMAL LOW (ref 3.87–5.11)
RBC: 2.82 MIL/uL — ABNORMAL LOW (ref 3.87–5.11)
RDW: 14 % (ref 11.5–15.5)
RDW: 14.1 % (ref 11.5–15.5)
WBC: 6.5 10*3/uL (ref 4.0–10.5)
WBC: 8.8 10*3/uL (ref 4.0–10.5)
nRBC: 0 % (ref 0.0–0.2)
nRBC: 0 % (ref 0.0–0.2)

## 2020-08-07 LAB — BASIC METABOLIC PANEL
Anion gap: 14 (ref 5–15)
Anion gap: 14 (ref 5–15)
BUN: 24 mg/dL — ABNORMAL HIGH (ref 6–20)
BUN: 31 mg/dL — ABNORMAL HIGH (ref 6–20)
CO2: 28 mmol/L (ref 22–32)
CO2: 27 mmol/L (ref 22–32)
Calcium: 10.3 mg/dL (ref 8.9–10.3)
Calcium: 9.5 mg/dL (ref 8.9–10.3)
Chloride: 94 mmol/L — ABNORMAL LOW (ref 98–111)
Chloride: 94 mmol/L — ABNORMAL LOW (ref 98–111)
Creatinine, Ser: 4.4 mg/dL — ABNORMAL HIGH (ref 0.44–1.00)
Creatinine, Ser: 4.98 mg/dL — ABNORMAL HIGH (ref 0.44–1.00)
GFR, Estimated: 12 mL/min — ABNORMAL LOW (ref >60)
GFR, Estimated: 10 mL/min — ABNORMAL LOW (ref >60)
Glucose, Bld: 108 mg/dL — ABNORMAL HIGH (ref 70–99)
Glucose, Bld: 110 mg/dL — ABNORMAL HIGH (ref 70–99)
Potassium: 3.7 mmol/L (ref 3.5–5.1)
Potassium: 3.6 mmol/L (ref 3.5–5.1)
Sodium: 136 mmol/L (ref 135–145)
Sodium: 135 mmol/L (ref 135–145)

## 2020-08-07 LAB — PROCALCITONIN: Procalcitonin: 2.3 ng/mL

## 2020-08-07 LAB — HIV ANTIBODY (ROUTINE TESTING W REFLEX): HIV Screen 4th Generation wRfx: NONREACTIVE

## 2020-08-07 LAB — LACTIC ACID, PLASMA: Lactic Acid, Venous: 1.3 mmol/L (ref 0.5–1.9)

## 2020-08-07 LAB — BRAIN NATRIURETIC PEPTIDE: B Natriuretic Peptide: 619.9 pg/mL — ABNORMAL HIGH (ref 0.0–100.0)

## 2020-08-07 LAB — MRSA PCR SCREENING: MRSA by PCR: NEGATIVE

## 2020-08-07 LAB — CBG MONITORING, ED: Glucose-Capillary: 95 mg/dL (ref 70–99)

## 2020-08-07 MED ORDER — VANCOMYCIN HCL 1250 MG/250ML IV SOLN
1250.0000 mg | Freq: Once | INTRAVENOUS | Status: AC
  Administered 2020-08-07: 1250 mg via INTRAVENOUS
  Filled 2020-08-07: qty 250

## 2020-08-07 MED ORDER — DOCUSATE SODIUM 100 MG PO CAPS
100.0000 mg | ORAL_CAPSULE | Freq: Every day | ORAL | Status: DC
  Administered 2020-08-07: 100 mg via ORAL
  Filled 2020-08-07: qty 1

## 2020-08-07 MED ORDER — SODIUM CHLORIDE 0.9 % IV SOLN
1.0000 g | INTRAVENOUS | Status: DC
  Administered 2020-08-07: 1 g via INTRAVENOUS
  Filled 2020-08-07: qty 1

## 2020-08-07 MED ORDER — POLYETHYLENE GLYCOL 3350 17 G PO PACK
17.0000 g | PACK | Freq: Every day | ORAL | Status: DC
  Administered 2020-08-07 – 2020-08-08 (×2): 17 g via ORAL
  Filled 2020-08-07 (×2): qty 1

## 2020-08-07 MED ORDER — SEVELAMER CARBONATE 800 MG PO TABS
1600.0000 mg | ORAL_TABLET | Freq: Three times a day (TID) | ORAL | Status: DC
  Administered 2020-08-07 – 2020-08-08 (×4): 1600 mg via ORAL
  Filled 2020-08-07 (×5): qty 2

## 2020-08-07 MED ORDER — HYDRALAZINE HCL 25 MG PO TABS
100.0000 mg | ORAL_TABLET | Freq: Three times a day (TID) | ORAL | Status: DC
  Administered 2020-08-07 – 2020-08-08 (×5): 100 mg via ORAL
  Filled 2020-08-07 (×5): qty 4

## 2020-08-07 MED ORDER — VANCOMYCIN VARIABLE DOSE PER UNSTABLE RENAL FUNCTION (PHARMACIST DOSING): Status: DC

## 2020-08-07 MED ORDER — CINACALCET HCL 30 MG PO TABS
30.0000 mg | ORAL_TABLET | ORAL | Status: DC
  Filled 2020-08-07: qty 1

## 2020-08-07 MED ORDER — DOXERCALCIFEROL 4 MCG/2ML IV SOLN
2.0000 ug | INTRAVENOUS | Status: DC
  Filled 2020-08-07: qty 2

## 2020-08-07 MED ORDER — ONDANSETRON HCL 4 MG/2ML IJ SOLN
4.0000 mg | Freq: Four times a day (QID) | INTRAMUSCULAR | Status: DC | PRN
  Administered 2020-08-07: 4 mg via INTRAVENOUS
  Filled 2020-08-07: qty 2

## 2020-08-07 MED ORDER — SODIUM CHLORIDE 0.9 % IV SOLN
500.0000 mg | INTRAVENOUS | Status: DC
  Administered 2020-08-07: 500 mg via INTRAVENOUS
  Filled 2020-08-07 (×2): qty 500

## 2020-08-07 MED ORDER — LEVETIRACETAM 500 MG PO TABS
1000.0000 mg | ORAL_TABLET | Freq: Every day | ORAL | Status: DC
  Administered 2020-08-08: 1000 mg via ORAL
  Filled 2020-08-07: qty 2

## 2020-08-07 MED ORDER — AMLODIPINE BESYLATE 10 MG PO TABS
10.0000 mg | ORAL_TABLET | Freq: Every day | ORAL | Status: DC
  Administered 2020-08-07 – 2020-08-08 (×3): 10 mg via ORAL
  Filled 2020-08-07: qty 2
  Filled 2020-08-07 (×2): qty 1

## 2020-08-07 MED ORDER — SODIUM CHLORIDE 0.9 % IV SOLN
1.0000 g | INTRAVENOUS | Status: DC
  Administered 2020-08-07 – 2020-08-08 (×2): 1 g via INTRAVENOUS
  Filled 2020-08-07: qty 10
  Filled 2020-08-07 (×2): qty 1

## 2020-08-07 MED ORDER — HEPARIN SODIUM (PORCINE) 5000 UNIT/ML IJ SOLN
5000.0000 [IU] | Freq: Three times a day (TID) | INTRAMUSCULAR | Status: DC
  Administered 2020-08-07 – 2020-08-09 (×7): 5000 [IU] via SUBCUTANEOUS
  Filled 2020-08-07 (×7): qty 1

## 2020-08-07 MED ORDER — OXYCODONE-ACETAMINOPHEN 5-325 MG PO TABS
1.0000 | ORAL_TABLET | ORAL | Status: DC | PRN
  Administered 2020-08-07 – 2020-08-09 (×9): 1 via ORAL
  Filled 2020-08-07 (×9): qty 1

## 2020-08-07 MED ORDER — SEVELAMER CARBONATE 800 MG PO TABS: 800.0000 mg | ORAL_TABLET | Freq: Two times a day (BID) | ORAL | Status: DC | PRN

## 2020-08-07 MED ORDER — LEVETIRACETAM 500 MG PO TABS
1000.0000 mg | ORAL_TABLET | ORAL | Status: AC
  Administered 2020-08-07: 1000 mg via ORAL
  Filled 2020-08-07: qty 2

## 2020-08-07 MED ORDER — CARVEDILOL 25 MG PO TABS
25.0000 mg | ORAL_TABLET | Freq: Two times a day (BID) | ORAL | Status: DC
  Administered 2020-08-07 – 2020-08-08 (×3): 25 mg via ORAL
  Filled 2020-08-07 (×3): qty 1

## 2020-08-07 NOTE — ED Notes (Signed)
CBG within range post-dialysis.

## 2020-08-07 NOTE — Progress Notes (Signed)
Pt wanting to get up to [facility] before VAST places PIV. Notified staff. Fran Lowes, RN VAST

## 2020-08-07 NOTE — Consult Note (Addendum)
Renal Service Consult Note [facility]  Abrey Bradway 08/07/2020 Sol Blazing, MD Requesting Physician: Dr. Loann Quill  Reason for Consult: ESRD pt w/  HPI: The patient is a 45 y.o. year-old w/ hx of HTN, ESRD on HD came to ED for pain and swelling her left arm.  Had surgery on her AVF (plication and extensive revision) about 1 wk ago. No fevers or chills, some orangish drainage.  CXR showed RLL PNA and pt needed O2. Pt was admitted and started on IV vanc/ cefepime and O2 support. Covid was negative. Last HD was Sat , left 45 min early. Asked to see for dialysis yesterday, orders written and pt had HD here late last night w/ 3 L UF.  BP's remain high.  Today wt's are 56kg.  Asked to see for ESRD.   Pt is w/o c/o today. She had chills/ very cold on HD last night, resolved when off the machine.  She states low O2 recorded w/ her O2 off this am after HD.  Denies prod cough or fever.  tmax was 100.3 yest.     ROS  denies CP  no joint pain   no HA  no blurry vision  no rash  no diarrhea  no nausea/ vomiting    Past Medical History  Past Medical History:  Diagnosis Date  . Anemia   . Chronic kidney disease   . Headache, unspecified 09/26/2017  . Hypertension   . Shortness of breath 07/15/2018   Past Surgical History  Past Surgical History:  Procedure Laterality Date  . AV FISTULA PLACEMENT Left 06/24/2017   Procedure: CREATION OF LEFT ARM BRACHIALCEPHALIC  ARTERIOVENOUS (AV) FISTULA;  Surgeon: Rosetta Posner, MD;  Location: [place];  Service: Vascular;  Laterality: Left;  . CESAREAN SECTION     x2  . COMPLEX WOUND CLOSURE Left 07/30/2020   Procedure: COMPLEX WOUND CLOSURE;  Surgeon: Cherre Robins, MD;  Location: [place];  Service: Vascular;  Laterality: Left;  . FISTULA SUPERFICIALIZATION Left 10/28/2017   Procedure: SUPERFICIALIZATION LEFT BRACHIOCEPHALIC ARTERIOVENOUS FISTULA;  Surgeon: Rosetta Posner, MD;  Location: [place];  Service: Vascular;  Laterality: Left;   . INSERTION OF DIALYSIS CATHETER Right 06/24/2017   Procedure: INSERTION OF TUNNELED DIALYSIS CATHETER;  Surgeon: Rosetta Posner, MD;  Location: [place];  Service: Vascular;  Laterality: Right;  . INSERTION OF DIALYSIS CATHETER Right 07/30/2020   Procedure: INSERTION OF DIALYSIS CATHETER;  Surgeon: Cherre Robins, MD;  Location: [place];  Service: Vascular;  Laterality: Right;  . LIGATION OF COMPETING BRANCHES OF ARTERIOVENOUS FISTULA  10/28/2017   Procedure: LIGATION OF COMPETING BRANCHES OF ARTERIOVENOUS FISTULA x3;  Surgeon: Rosetta Posner, MD;  [facility];  Service: Vascular;;  . REVISON OF ARTERIOVENOUS FISTULA Left 07/30/2020   Procedure: LEFT UPPER EXTREMITY ARTERIOVENOUS FISTULA REVISON;  Surgeon: Cherre Robins, MD;  [facility];  Service: Vascular;  Laterality: Left;  PERIPHERAL NERVE BLOCK   Family History  Family History  Problem Relation Age of Onset  . Diabetes Father   . Hypertension Father   . Cancer Father   . Heart disease Father   . Aneurysm Mother   . Seizures Mother    Social History  reports that she has been smoking cigarettes. She has a 3.75 pack-year smoking history. She has never used smokeless tobacco. She reports current alcohol use. She reports current drug use. Drug: Marijuana. Allergies  Allergies  Allergen Reactions  . Visine [Tetrahydrozoline Hcl]  Swelling    Eyes Swelling   Home medications Prior to Admission medications   Medication Sig Start Date End Date Taking? Authorizing Provider  acetaminophen (TYLENOL) 500 MG tablet Take 1,000 mg by mouth every Tuesday, Thursday, and Saturday at 6 PM. With dialysis   Yes [provider]  amLODipine (NORVASC) 10 MG tablet Take 1 tablet (10 mg total) by mouth at bedtime. 12/09/19 01/08/20 Yes Aslam, Loralyn Freshwater, MD  B Complex-C-Folic Acid (DIALYVITE TABLET) TABS Take 1 tablet by mouth daily. 09/27/19  Yes [provider]  carvedilol (COREG) 25 MG tablet Take 1 tablet (25 mg total) by mouth 2 (two)  times daily with a meal. 12/09/19 01/08/20 Yes Aslam, Sadia, MD  diphenhydrAMINE (BENADRYL) 25 MG tablet Take 25 mg by mouth every 6 (six) hours as needed for itching or sleep.   Yes [provider]  hydrALAZINE (APRESOLINE) 100 MG tablet Take 1 tablet (100 mg total) by mouth 3 (three) times daily. 12/09/19 01/08/20 Yes Aslam, Loralyn Freshwater, MD  levETIRAcetam (KEPPRA) 1000 MG tablet Take 1,000 mg by mouth daily. 06/29/20  Yes [provider]  sevelamer carbonate (RENVELA) 800 MG tablet Take 2 tablets (1,600 mg total) by mouth 3 (three) times daily with meals. Patient taking differently: Take 1,600 mg by mouth See admin instructions. Take 2 tablets (1600 mg) by mouth 3 times daily with meals & take 1 tablet (800 mg) by mouth with snacks 02/21/19  Yes Madalyn Rob, MD  spironolactone (ALDACTONE) 50 MG tablet Take 1 tablet (50 mg total) by mouth daily. 12/09/19  Yes Aslam, Loralyn Freshwater, MD  oxyCODONE-acetaminophen (PERCOCET/ROXICET) 5-325 MG tablet Take 1 tablet by mouth every 4 (four) hours as needed for moderate pain. 08/06/20   Dagoberto Ligas, PA-C     Vitals:   08/07/20 0505 08/07/20 0601 08/07/20 0621 08/07/20 1051  BP:  (!) 151/105 (!) 146/100 (!) 164/109  Pulse:   (!) 103 (!) 107  Resp:  18  20  Temp: 99.1 F (37.3 C) 99.5 F (37.5 C)  98.6 F (37 C)  TempSrc: Oral Oral  Oral  SpO2:  100% 99% 95%   Exam Gen alert, no distress,  No rash, cyanosis or gangrene Sclera anicteric, throat clear  No jvd or bruits Chest soft rales R base, L clear RRR no MRG Abd soft ntnd no mass or ascites +bs GU defer MS no joint effusions or deformity Ext no leg or UE edema, no wounds or ulcers Neuro is alert, Ox 3 , nf LUA AVF is wrapped after recent surg/ R IJ [facility]    Home meds:  - norvasc 10/ hydralazine 100 tid/ coreg 25 bid  - perceot qid prn/ keppra 1 gm qd  - renvela 2 ac tid  - prn's/ vitamins/ supplements  CXR 2/22 - IMPRESSION: Unchanged right lower lobe consolidation and small right  pleural effusion.    OP [facility]  3h 4min  350/500  56kg  2/2 bath  Hep none  RIJ TDC/ LUA AVF sp plication  - sensipar 30 tiw  - hect 2 ug tiw  - venofer 100 q hd , not started yet  - mircera 75 ug q4 wks, last 2/17     Assessment/ Plan: 1. RLL PNA - by CXR, per primary team 2. Hypoxic resp failure, acute - on nasal O2, not in distress. 3 L off overnight w/ HD.  Need standing wt this afternoon. Due to combination of fluid excess and pna.  3. ESRD - on HD TTS.  Got HD here early this am/ overnight. Plan next HD off schedule tomorrow.  4. HTN/ vol - BP's are sig high still, plan max UF w/ HD tomorrow. 5. Anemia ckd - Hb 8.6, just got 2 wk esa 5 d ago. Hold IV Fe w/ infection.  6. MBD ckd - cont meds      Kelly Splinter  MD 08/07/2020, 1:59 PM  Recent Labs  Lab 08/07/20 0020 08/07/20 0459  WBC 8.8 6.5  HGB 9.3* 8.6*   Recent Labs  Lab 08/07/20 0020 08/07/20 0459  K 3.7 3.6  BUN 24* 31*  CREATININE 4.40* 4.98*  CALCIUM 10.3 9.5

## 2020-08-07 NOTE — ED Notes (Signed)
Attempted report x1. 

## 2020-08-07 NOTE — H&P (Addendum)
History and Physical    Madeline Brown XBM:[id number] DOB: 19-May-1976 DOA: 08/06/2020  PCP: Madeline Brown., FNP  Patient coming from: Home  I have personally briefly reviewed patient's old medical records in [place]  Chief Complaint: SOB  HPI: Madeline Brown is a 45 y.o. female with medical history significant of ESRD, HTN.  Pt discharged from hospital on 2/16 following extensive revision of LUE AVF and complex wound closure.  She came in to ED AM of 2/21 with ongoing LUE pain/numbness as well as worsening dyspnea.  EDP was initially concerned for fluid overload, so patient was dialyzed today and plan was to discharge patient after dialysis.  However, after dialysis pt noted to still have O2 requirement and became SOB when they attempted to stop O2.  Additionally patient running Tm 100.3 and HR 108.  Repeat CXR reveals that the RLL infiltrate is persistent.   ED Course: Lab work with procalcitonin of 2.3, WBC only 8.8k.  COVID neg.  PT started on cefepime and vanc for HCAP / VAP given recent admission and intubation for surgery.  Hospitalist asked to admit.   Review of Systems: As per HPI, otherwise all review of systems negative.  Past Medical History:  Diagnosis Date  . Anemia   . Chronic kidney disease   . Headache, unspecified 09/26/2017  . Hypertension   . Shortness of breath 07/15/2018    Past Surgical History:  Procedure Laterality Date  . AV FISTULA PLACEMENT Left 06/24/2017   Procedure: CREATION OF LEFT ARM BRACHIALCEPHALIC  ARTERIOVENOUS (AV) FISTULA;  Surgeon: Madeline Posner, MD;  Location: [place];  Service: Vascular;  Laterality: Left;  . CESAREAN SECTION     x2  . COMPLEX WOUND CLOSURE Left 07/30/2020   Procedure: COMPLEX WOUND CLOSURE;  Surgeon: Madeline Robins, MD;  Location: [place];  Service: Vascular;  Laterality: Left;  . FISTULA SUPERFICIALIZATION Left 10/28/2017   Procedure: SUPERFICIALIZATION LEFT BRACHIOCEPHALIC ARTERIOVENOUS FISTULA;   Surgeon: Madeline Posner, MD;  Location: [place];  Service: Vascular;  Laterality: Left;  . INSERTION OF DIALYSIS CATHETER Right 06/24/2017   Procedure: INSERTION OF TUNNELED DIALYSIS CATHETER;  Surgeon: Madeline Posner, MD;  Location: [place];  Service: Vascular;  Laterality: Right;  . INSERTION OF DIALYSIS CATHETER Right 07/30/2020   Procedure: INSERTION OF DIALYSIS CATHETER;  Surgeon: Madeline Robins, MD;  Location: [place];  Service: Vascular;  Laterality: Right;  . LIGATION OF COMPETING BRANCHES OF ARTERIOVENOUS FISTULA  10/28/2017   Procedure: LIGATION OF COMPETING BRANCHES OF ARTERIOVENOUS FISTULA x3;  Surgeon: Madeline Posner, MD;  Location: [place];  Service: Vascular;;  . REVISON OF ARTERIOVENOUS FISTULA Left 07/30/2020   Procedure: LEFT UPPER EXTREMITY ARTERIOVENOUS FISTULA REVISON;  Surgeon: Madeline Robins, MD;  Location: [place];  Service: Vascular;  Laterality: Left;  PERIPHERAL NERVE BLOCK     reports that she has been smoking cigarettes. She has a 3.75 pack-year smoking history. She has never used smokeless tobacco. She reports current alcohol use. She reports current drug use. Drug: Marijuana.  Allergies  Allergen Reactions  . Visine [Tetrahydrozoline Hcl] Swelling    Eyes Swelling    Family History  Problem Relation Age of Onset  . Diabetes Father   . Hypertension Father   . Cancer Father   . Heart disease Father   . Aneurysm Mother   . Seizures Mother      Prior to Admission medications   Medication Sig Start Date End Date Taking? Authorizing  Provider  acetaminophen (TYLENOL) 500 MG tablet Take 1,000 mg by mouth every Tuesday, Thursday, and Saturday at 6 PM. With dialysis   Yes [provider]  amLODipine (NORVASC) 10 MG tablet Take 1 tablet (10 mg total) by mouth at bedtime. 12/09/19 01/08/20 Yes Aslam, Loralyn Freshwater, MD  B Complex-C-Folic Acid (DIALYVITE TABLET) TABS Take 1 tablet by mouth daily. 09/27/19  Yes [provider]  carvedilol (COREG) 25 MG tablet Take 1 tablet  (25 mg total) by mouth 2 (two) times daily with a meal. 12/09/19 01/08/20 Yes Aslam, Sadia, MD  diphenhydrAMINE (BENADRYL) 25 MG tablet Take 25 mg by mouth every 6 (six) hours as needed for itching or sleep.   Yes [provider]  hydrALAZINE (APRESOLINE) 100 MG tablet Take 1 tablet (100 mg total) by mouth 3 (three) times daily. 12/09/19 01/08/20 Yes Aslam, Loralyn Freshwater, MD  levETIRAcetam (KEPPRA) 1000 MG tablet Take 1,000 mg by mouth daily. 06/29/20  Yes [provider]  sevelamer carbonate (RENVELA) 800 MG tablet Take 2 tablets (1,600 mg total) by mouth 3 (three) times daily with meals. Patient taking differently: Take 1,600 mg by mouth See admin instructions. Take 2 tablets (1600 mg) by mouth 3 times daily with meals & take 1 tablet (800 mg) by mouth with snacks 02/21/19  Yes Madeline Rob, MD  spironolactone (ALDACTONE) 50 MG tablet Take 1 tablet (50 mg total) by mouth daily. 12/09/19  Yes Aslam, Loralyn Freshwater, MD  oxyCODONE-acetaminophen (PERCOCET/ROXICET) 5-325 MG tablet Take 1 tablet by mouth every 4 (four) hours as needed for moderate pain. 08/06/20   Madeline Ligas, PA-C    Physical Exam: Vitals:   08/07/20 0041 08/07/20 0100 08/07/20 0200 08/07/20 0300  BP: (!) 162/105 (!) 160/107 (!) 156/106 (!) 154/96  Pulse: (!) 118 (!) 114 (!) 108 (!) 108  Resp: 18 (!) 24 (!) 21 (!) 22  Temp:      TempSrc:      SpO2: 98% 99% 94% 90%    Constitutional: NAD, calm, comfortable Eyes: PERRL, lids and conjunctivae normal ENMT: Mucous membranes are moist. Posterior pharynx clear of any exudate or lesions.Normal dentition.  Neck: normal, supple, no masses, no thyromegaly Respiratory: clear to auscultation bilaterally, no wheezing, no crackles. Normal respiratory effort. No accessory muscle use.  Cardiovascular: Regular rate and rhythm, no murmurs / rubs / gallops. No extremity edema. 2+ pedal pulses. No carotid bruits.  Abdomen: no tenderness, no masses palpated. No hepatosplenomegaly. Bowel sounds  positive.  Musculoskeletal: no clubbing / cyanosis. No joint deformity upper and lower extremities. Good ROM, no contractures. Normal muscle tone.  Skin: no rashes, lesions, ulcers. No induration Neurologic: CN 2-12 grossly intact. Sensation intact, DTR normal. Strength 5/5 in all 4.  Psychiatric: Normal judgment and insight. Alert and oriented x 3. Normal mood.    Labs on Admission: I have personally reviewed following labs and imaging studies  CBC: Recent Labs  Lab 07/31/20 1207 08/01/20 0957 08/06/20 0405 08/07/20 0020  WBC 6.3 7.5 7.3 8.8  NEUTROABS  --   --  6.0  --   HGB 6.3* 9.4* 9.5* 9.3*  HCT 18.8* 27.5* 28.6* 27.8*  MCV 100.5* 97.5 100.0 98.6  PLT 95* 94* 103* 734*   Basic Metabolic Panel: Recent Labs  Lab 07/31/20 1207 08/06/20 0405 08/07/20 0020  NA 129* 132* 136  K 4.5 3.9 3.7  CL 89* 93* 94*  CO2 26 24 28   GLUCOSE 110* 109* 108*  BUN 67* 52* 24*  CREATININE 9.51* 7.73* 4.40*  CALCIUM 9.0  9.7 10.3  PHOS 7.1*  --   --    GFR: Estimated Creatinine Clearance: 13 mL/min (A) (by C-G formula based on SCr of 4.4 mg/dL (H)). Liver Function Tests: Recent Labs  Lab 07/31/20 1207 08/06/20 0405  AST  --  22  ALT  --  8  ALKPHOS  --  40  BILITOT  --  1.3*  PROT  --  7.1  ALBUMIN 3.3* 3.5   No results for input(s): LIPASE, AMYLASE in the last 168 hours. No results for input(s): AMMONIA in the last 168 hours. Coagulation Profile: No results for input(s): INR, PROTIME in the last 168 hours. Cardiac Enzymes: No results for input(s): CKTOTAL, CKMB, CKMBINDEX, TROPONINI in the last 168 hours. BNP (last 3 results) No results for input(s): PROBNP in the last 8760 hours. HbA1C: No results for input(s): HGBA1C in the last 72 hours. CBG: Recent Labs  Lab 08/07/20 0107  GLUCAP 95   Lipid Profile: No results for input(s): CHOL, HDL, LDLCALC, TRIG, CHOLHDL, LDLDIRECT in the last 72 hours. Thyroid Function Tests: No results for input(s): TSH, T4TOTAL, FREET4,  T3FREE, THYROIDAB in the last 72 hours. Anemia Panel: No results for input(s): VITAMINB12, FOLATE, FERRITIN, TIBC, IRON, RETICCTPCT in the last 72 hours. Urine analysis:    Component Value Date/Time   COLORURINE YELLOW 02/19/2019 1545   APPEARANCEUR CLOUDY (A) 02/19/2019 1545   LABSPEC 1.011 02/19/2019 1545   PHURINE 9.0 (H) 02/19/2019 1545   GLUCOSEU NEGATIVE 02/19/2019 1545   HGBUR NEGATIVE 02/19/2019 1545   [place] 02/19/2019 1545   KETONESUR NEGATIVE 02/19/2019 1545   PROTEINUR >=300 (A) 02/19/2019 1545   UROBILINOGEN 0.2 06/17/2016 1257   NITRITE NEGATIVE 02/19/2019 1545   LEUKOCYTESUR NEGATIVE 02/19/2019 1545    Radiological Exams on Admission: DG Chest 1V REPEAT Same Day  Result Date: 08/07/2020 CLINICAL DATA:  Hypoxia with fever EXAM: CHEST - 1 VIEW SAME DAY COMPARISON:  08/06/2020 FINDINGS: Unchanged area of consolidation in the right lower lobe with small right pleural effusion. Unchanged position of right chest wall hemodialysis catheter. Mild cardiomegaly. IMPRESSION: Unchanged right lower lobe consolidation and small right pleural effusion. Electronically Signed   By: Ulyses Jarred M.D.   On: 08/07/2020 01:19   DG Chest Port 1 View  Result Date: 08/06/2020 CLINICAL DATA:  Shortness of breath EXAM: PORTABLE CHEST 1 VIEW COMPARISON:  Seven days ago FINDINGS: Dialysis catheter with tip at the upper cavoatrial junction. Cardiomegaly and congested appearance of hilar vessels. No definite effusion, edema, or pneumothorax. Airspace opacity at the bases asymmetric to the right. IMPRESSION: 1. Pulmonary opacity at the bases with asymmetry to the right more worrisome for pneumonia than edema. 2. Vascular congestion. Electronically Signed   By: Monte Fantasia M.D.   On: 08/06/2020 04:44    EKG: Independently reviewed.  Assessment/Plan Principal Problem:   Right lower lobe pneumonia Active Problems:   Hypertension   ESRD (end stage renal disease) (HCC)   Seizure  ([place])   Acute respiratory failure with hypoxia ([place])    1. RLL PNA causing new O2 requirement - 1. PNA pathway 2. Cont cefepime + Vanc for now 3. Cultures pending 4. Cont pulse ox 5. COVID negative 6. MRSA PCR 2. ESRD - 1. Just had dialysis yesterday 2. Nephrology was following, may wish to give them a heads up in AM that patient hasnt been discharged. 3. HTN - 1. Cont home BP meds 4. H/o Seizure disorder - 1. Cont home Ray 2. Got dose in ED  DVT prophylaxis: Heparin [place] Code Status: Full Family Communication: No family in room Disposition Plan: Home after admit Consults called: EDP spoke with nephrology Admission status: Admit to inpatient  Severity of Illness: The appropriate patient status for this patient is INPATIENT. Inpatient status is judged to be reasonable and necessary in order to provide the required intensity of service to ensure the patient's safety. The patient's presenting symptoms, physical exam findings, and initial radiographic and laboratory data in the context of their chronic comorbidities is felt to place them at high risk for further clinical deterioration. Furthermore, it is not anticipated that the patient will be medically stable for discharge from the hospital within 2 midnights of admission. The following factors support the patient status of inpatient.   IP status due to new O2 requirement associated with PNA.   * I certify that at the point of admission it is my clinical judgment that the patient will require inpatient hospital care spanning beyond 2 midnights from the point of admission due to high intensity of service, high risk for further deterioration and high frequency of surveillance required.*    Shakisha Abend M. DO [facility]  How to contact the [facility] Attending or Consulting provider [place] or covering provider during after hours [place], for this patient?  1. Check the care team in [facility] and look for a) attending/consulting [facility]  provider listed and b) the [facility] team listed 2. Log into [email or web address]  Amion Physician Scheduling and messaging for groups and whole hospitals  On call and physician scheduling software for group practices, residents, hospitalists and other medical providers for call, clinic, rotation and shift schedules. On[employer] is a hospital-wide system for scheduling doctors and paging doctors on call. EasyPlot is for scientific plotting and data analysis.  [email or web address]  and use [place]'s universal password to access. If you do not have the password, please contact the hospital operator.  3. Locate the [facility] provider you are looking for under [facility] and page to a number that you can be directly reached. 4. If you still have difficulty reaching the provider, please page the [facility] (Director on Call) for the Hospitalists listed on amion for assistance.  08/07/2020, 3:21 AM

## 2020-08-07 NOTE — Progress Notes (Signed)
Pharmacy Antibiotic Note  Madeline Brown is a 45 y.o. female admitted on 08/06/2020 with pneumonia.  Pharmacy has been consulted for cefepime and vancomycin dosing.  Plan: Cefepime 1gm IV q24 hours Vancomycin 1250 mg IV x 1 F/u future HD orders for redose     Temp (24hrs), Avg:99 F (37.2 C), Min:98.2 F (36.8 C), Max:100.3 F (37.9 C)  Recent Labs  Lab 07/31/20 1207 08/01/20 0957 08/06/20 0405  WBC 6.3 7.5 7.3  CREATININE 9.51*  --  7.73*    Estimated Creatinine Clearance: 7.4 mL/min (A) (by C-G formula based on SCr of 7.73 mg/dL (H)).    Allergies  Allergen Reactions  . Visine [Tetrahydrozoline Hcl] Swelling    Eyes Swelling    Thank you for allowing pharmacy to be a part of this patient's care.  Excell Seltzer Poteet 08/07/2020 12:05 AM

## 2020-08-07 NOTE — Progress Notes (Signed)
PT Cancellation Note  Patient Details Name: Madeline Brown MRN: [id number] DOB: 1976/01/20   Cancelled Treatment:    Reason Eval/Treat Not Completed: Medical issues which prohibited therapy diastolic blood pressures still running high- will hold for now and re-attempt on next date of service.   Windell Norfolk, DPT, PN1   Supplemental Physical Therapist [facility]    Pager [phone] Acute Rehab Office [phone]

## 2020-08-07 NOTE — Progress Notes (Addendum)
PROGRESS NOTE    Madeline Brown  QBH:[id number] DOB: March 05, 1976 DOA: 08/06/2020 PCP: Sonia Side., FNP   Brief Narrative:  Patient is 45 year old female with past medical history of ESRD-on hemodialysis, hypertension presents recently discharged from the hospital on 2/16 following extensive revision of LUE aVF and complex wound closure.  He came into the ED in the morning of 2/21 with ongoing LUE pain/numbness as well as worsening dyspnea.  There was a concern of fluid overload so patient was dialyzed and was plan to discharge patient after dialysis however after dialysis patient noted to have new oxygen requirement and became shortness of breath when they attempted to stop oxygen.  Additionally patient running low-grade fever of 103 and tachycardic.  Repeat chest x-ray was obtained which reveals right lower lobe infiltrates is persistent.  Patient admitted for pneumonia.  Assessment & Plan:   Acute hypoxemic respiratory failure in the setting of right lower lobe pneumonia: -Patient had low-grade fever of 103, no leukocytosis, COVID-19 negative.  Chest x-ray reviewed.  Lactic acid: WNL -Patient started on cefepime and vancomycin.  MRSA PCR negative.  Switch IV antibiotics to Rocephin and azithromycin.  PCT: 2.3. -We will try to wean off of oxygen as tolerated.  Tylenol as needed for fever more than 100.4.  ESRD: On hemodialysis -Patient received hemodialysis yesterday 2/21-appreciate help.  Hypertension: Stable -Continue home meds for the pain, Coreg, hydralazine -Monitor blood pressure closely  Status post recent extensive revision of left AV fistula: -Seen by vascular surgery on 2/21 shows no signs or suspicion of infection and fistula is widely patent with palpable thrill. recommend to follow-up in office in 1 week for staple removal. -Continue as needed pain medication -Patient complaining of numbness-we will consult PT/OT.  Anemia of chronic disease: Likely in the setting of  ESRD -H&H is stable.  Continue to monitor  Chronic thrombocytopenia: Platelet: 121 -No signs of active bleeding.  Continue to monitor  History of seizure disorder: Continue Keppra  Chronic constipation: Continue MiraLAX and Colace  Tobacco use/marijuana use:  -Smokes 4 to 5 cigarettes/day and uses marijuana every day  -counseled about cessation  DVT prophylaxis: Heparin Code Status: Full code Family Communication:  None present at bedside.  Plan of care discussed with patient in length and he verbalized understanding and agreed with it. Disposition Plan: Likely home tomorrow  Consultants:   Nephrology  Procedures:  None Antimicrobials:   Cefepime  Vancomycin  Azithromycin  Rocephin  Status is: Inpatient  Dispo: The patient is from: Home              Anticipated d/c is to: Home              Anticipated d/c date is: 1 day              Patient currently is medically stable to d/c.   Difficult to place patient no    Subjective: Patient seen and examined.  Reports improvement in breathing however she is concerned that she is still requiring oxygen  Objective: Vitals:   08/07/20 0505 08/07/20 0601 08/07/20 0621 08/07/20 1051  BP:  (!) 151/105 (!) 146/100 (!) 164/109  Pulse:   (!) 103 (!) 107  Resp:  18  20  Temp: 99.1 F (37.3 C) 99.5 F (37.5 C)  98.6 F (37 C)  TempSrc: Oral Oral  Oral  SpO2:  100% 99% 95%    Intake/Output Summary (Last 24 hours) at 08/07/2020 1137 Last data filed at 08/07/2020 0973 Gross  per 24 hour  Intake 670 ml  Output 3000 ml  Net -2330 ml   There were no vitals filed for this visit.  Examination:  General exam: Appears calm and comfortable  Respiratory system: Clear to auscultation. Respiratory effort normal. Cardiovascular system: S1 & S2 heard, RRR. No JVD, murmurs, rubs, gallops or clicks. No pedal edema. Gastrointestinal system: Abdomen is nondistended, soft and nontender. No organomegaly or masses felt. Normal bowel  sounds heard. Central nervous system: Alert and oriented. No focal neurological deficits. Extremities: Symmetric 5 x 5 power. Skin: No rashes, lesions or ulcers Psychiatry: Judgement and insight appear normal. Mood & affect appropriate.    Data Reviewed: I have personally reviewed following labs and imaging studies  CBC: Recent Labs  Lab 07/31/20 1207 08/01/20 0957 08/06/20 0405 08/07/20 0020 08/07/20 0459  WBC 6.3 7.5 7.3 8.8 6.5  NEUTROABS  --   --  6.0  --   --   HGB 6.3* 9.4* 9.5* 9.3* 8.6*  HCT 18.8* 27.5* 28.6* 27.8* 25.7*  MCV 100.5* 97.5 100.0 98.6 100.8*  PLT 95* 94* 103* 121* 810*   Basic Metabolic Panel: Recent Labs  Lab 07/31/20 1207 08/06/20 0405 08/07/20 0020 08/07/20 0459  NA 129* 132* 136 135  K 4.5 3.9 3.7 3.6  CL 89* 93* 94* 94*  CO2 26 24 28 27   GLUCOSE 110* 109* 108* 110*  BUN 67* 52* 24* 31*  CREATININE 9.51* 7.73* 4.40* 4.98*  CALCIUM 9.0 9.7 10.3 9.5  PHOS 7.1*  --   --   --    GFR: Estimated Creatinine Clearance: 11.5 mL/min (A) (by C-G formula based on SCr of 4.98 mg/dL (H)). Liver Function Tests: Recent Labs  Lab 07/31/20 1207 08/06/20 0405  AST  --  22  ALT  --  8  ALKPHOS  --  40  BILITOT  --  1.3*  PROT  --  7.1  ALBUMIN 3.3* 3.5   No results for input(s): LIPASE, AMYLASE in the last 168 hours. No results for input(s): AMMONIA in the last 168 hours. Coagulation Profile: No results for input(s): INR, PROTIME in the last 168 hours. Cardiac Enzymes: No results for input(s): CKTOTAL, CKMB, CKMBINDEX, TROPONINI in the last 168 hours. BNP (last 3 results) No results for input(s): PROBNP in the last 8760 hours. HbA1C: No results for input(s): HGBA1C in the last 72 hours. CBG: Recent Labs  Lab 08/07/20 0107  GLUCAP 95   Lipid Profile: No results for input(s): CHOL, HDL, LDLCALC, TRIG, CHOLHDL, LDLDIRECT in the last 72 hours. Thyroid Function Tests: No results for input(s): TSH, T4TOTAL, FREET4, T3FREE, THYROIDAB in the  last 72 hours. Anemia Panel: No results for input(s): VITAMINB12, FOLATE, FERRITIN, TIBC, IRON, RETICCTPCT in the last 72 hours. Sepsis Labs: Recent Labs  Lab 08/07/20 0020 08/07/20 0054  PROCALCITON 2.30  --   LATICACIDVEN  --  1.3    Recent Results (from the past 240 hour(s))  SARS Coronavirus 2 by RT PCR (hospital order, performed in [facility] hospital lab) Nasopharyngeal Nasopharyngeal Swab     Status: None   Collection Time: 07/30/20  6:46 AM   Specimen: Nasopharyngeal Swab  Result Value Ref Range Status   SARS Coronavirus 2 NEGATIVE NEGATIVE Final    Comment: (NOTE) SARS-CoV-2 target nucleic acids are NOT DETECTED.  The SARS-CoV-2 RNA is generally detectable in upper and lower respiratory specimens during the acute phase of infection. The lowest concentration of SARS-CoV-2 viral copies this assay can detect is 250 copies /  mL. A negative result does not preclude SARS-CoV-2 infection and should not be used as the sole basis for treatment or other patient management decisions.  A negative result may occur with improper specimen collection / handling, submission of specimen other than nasopharyngeal swab, presence of viral mutation(s) within the areas targeted by this assay, and inadequate number of viral copies (<250 copies / mL). A negative result must be combined with clinical observations, patient history, and epidemiological information.  Fact Sheet for Patients:   StrictlyIdeas.no  Fact Sheet for Healthcare Providers: BankingDealers.co.za  This test is not yet approved or  cleared by the [place] FDA and has been authorized for detection and/or diagnosis of SARS-CoV-2 by FDA under an Emergency Use Authorization (EUA).  This EUA will remain in effect (meaning this test can be used) for the duration of the COVID-19 declaration under Section 564(b)(1) of the Act, 21 U.S.C. section 360bbb-3(b)(1), unless the  authorization is terminated or revoked sooner.  Performed at [facility], [place] [address], [place], [place] [address]   Resp Panel by RT-PCR (Flu A&B, Covid) Nasopharyngeal Swab     Status: None   Collection Time: 08/06/20  6:13 AM   Specimen: Nasopharyngeal Swab; Nasopharyngeal(NP) swabs in vial transport medium  Result Value Ref Range Status   SARS Coronavirus 2 by RT PCR NEGATIVE NEGATIVE Final    Comment: (NOTE) SARS-CoV-2 target nucleic acids are NOT DETECTED.  The SARS-CoV-2 RNA is generally detectable in upper respiratory specimens during the acute phase of infection. The lowest concentration of SARS-CoV-2 viral copies this assay can detect is 138 copies/mL. A negative result does not preclude SARS-Cov-2 infection and should not be used as the sole basis for treatment or other patient management decisions. A negative result may occur with  improper specimen collection/handling, submission of specimen other than nasopharyngeal swab, presence of viral mutation(s) within the areas targeted by this assay, and inadequate number of viral copies(<138 copies/mL). A negative result must be combined with clinical observations, patient history, and epidemiological information. The expected result is Negative.  Fact Sheet for Patients:  EntrepreneurPulse.com.au  Fact Sheet for Healthcare Providers:  IncredibleEmployment.be  This test is no t yet approved or cleared by the [place] FDA and  has been authorized for detection and/or diagnosis of SARS-CoV-2 by FDA under an Emergency Use Authorization (EUA). This EUA will remain  in effect (meaning this test can be used) for the duration of the COVID-19 declaration under Section 564(b)(1) of the Act, 21 U.S.C.section 360bbb-3(b)(1), unless the authorization is terminated  or revoked sooner.       Influenza A by PCR NEGATIVE NEGATIVE Final   Influenza B by PCR NEGATIVE NEGATIVE Final     Comment: (NOTE) The Xpert Xpress SARS-CoV-2/FLU/RSV plus assay is intended as an aid in the diagnosis of influenza from Nasopharyngeal swab specimens and should not be used as a sole basis for treatment. Nasal washings and aspirates are unacceptable for Xpert Xpress SARS-CoV-2/FLU/RSV testing.  Fact Sheet for Patients: EntrepreneurPulse.com.au  Fact Sheet for Healthcare Providers: IncredibleEmployment.be  This test is not yet approved or cleared by the [place] FDA and has been authorized for detection and/or diagnosis of SARS-CoV-2 by FDA under an Emergency Use Authorization (EUA). This EUA will remain in effect (meaning this test can be used) for the duration of the COVID-19 declaration under Section 564(b)(1) of the Act, 21 U.S.C. section 360bbb-3(b)(1), unless the authorization is terminated or revoked.  Performed at [facility], [address] [place], [place] [address]   MRSA PCR Screening     Status: None   Collection Time: 08/07/20  6:33 AM   Specimen: Nasal Mucosa; Nasopharyngeal  Result Value Ref Range Status   MRSA by PCR NEGATIVE NEGATIVE Final    Comment:        The GeneXpert MRSA Assay (FDA approved for NASAL specimens only), is one component of a comprehensive MRSA colonization surveillance program. It is not intended to diagnose MRSA infection nor to guide or monitor treatment for MRSA infections. Performed at [facility], [place] [address] [place], [place] [address]       Radiology Studies: DG Chest 1V REPEAT Same Day  Result Date: 08/07/2020 CLINICAL DATA:  Hypoxia with fever EXAM: CHEST - 1 VIEW SAME DAY COMPARISON:  08/06/2020 FINDINGS: Unchanged area of consolidation in the right lower lobe with small right pleural effusion. Unchanged position of right chest wall hemodialysis catheter. Mild cardiomegaly. IMPRESSION: Unchanged right lower lobe consolidation and small right pleural effusion.  Electronically Signed   By: Ulyses Jarred M.D.   On: 08/07/2020 01:19   DG Chest Port 1 View  Result Date: 08/06/2020 CLINICAL DATA:  Shortness of breath EXAM: PORTABLE CHEST 1 VIEW COMPARISON:  Seven days ago FINDINGS: Dialysis catheter with tip at the upper cavoatrial junction. Cardiomegaly and congested appearance of hilar vessels. No definite effusion, edema, or pneumothorax. Airspace opacity at the bases asymmetric to the right. IMPRESSION: 1. Pulmonary opacity at the bases with asymmetry to the right more worrisome for pneumonia than edema. 2. Vascular congestion. Electronically Signed   By: Monte Fantasia M.D.   On: 08/06/2020 04:44    Scheduled Meds: . amLODipine  10 mg Oral QHS  . carvedilol  25 mg Oral BID WC  . Chlorhexidine Gluconate Cloth  6 each Topical Q0600  . docusate sodium  100 mg Oral Daily  . heparin  5,000 Units Subcutaneous Q8H  . hydrALAZINE  100 mg Oral TID  . [START ON 08/08/2020] levETIRAcetam  1,000 mg Oral Daily  . polyethylene glycol  17 g Oral Daily  . sevelamer carbonate  1,600 mg Oral TID WC  . vancomycin variable dose per unstable renal function (pharmacist dosing)   Does not apply See admin instructions   Continuous Infusions: . ceFEPime (MAXIPIME) IV Stopped (08/07/20 0218)     LOS: 0 days   Time spent: 35 minutes  Rahmah Mccamy Loann Quill, MD [facility]  If 7PM-7AM, please contact night-coverage [email or web address] 08/07/2020, 11:37 AM

## 2020-08-08 MED ORDER — SODIUM CHLORIDE 0.9 % IV SOLN: 100.0000 mL | INTRAVENOUS | Status: DC | PRN

## 2020-08-08 MED ORDER — LIDOCAINE HCL (PF) 1 % IJ SOLN: 5.0000 mL | INTRAMUSCULAR | Status: DC | PRN

## 2020-08-08 MED ORDER — PENTAFLUOROPROP-TETRAFLUOROETH EX AERO: 1.0000 "application " | INHALATION_SPRAY | CUTANEOUS | Status: DC | PRN

## 2020-08-08 MED ORDER — DIPHENHYDRAMINE HCL 25 MG PO CAPS
25.0000 mg | ORAL_CAPSULE | Freq: Four times a day (QID) | ORAL | Status: DC | PRN
  Administered 2020-08-08: 25 mg via ORAL
  Filled 2020-08-08: qty 1

## 2020-08-08 MED ORDER — LIDOCAINE-PRILOCAINE 2.5-2.5 % EX CREA: 1.0000 "application " | TOPICAL_CREAM | CUTANEOUS | Status: DC | PRN

## 2020-08-08 MED ORDER — ALTEPLASE 2 MG IJ SOLR: 2.0000 mg | Freq: Once | INTRAMUSCULAR | Status: DC | PRN

## 2020-08-08 MED ORDER — HEPARIN SODIUM (PORCINE) 1000 UNIT/ML DIALYSIS: 1000.0000 [IU] | INTRAMUSCULAR | Status: DC | PRN

## 2020-08-08 MED ORDER — AZITHROMYCIN 250 MG PO TABS
500.0000 mg | ORAL_TABLET | ORAL | Status: DC
  Administered 2020-08-08: 500 mg via ORAL
  Filled 2020-08-08: qty 2

## 2020-08-08 MED ORDER — DIPHENHYDRAMINE HCL 25 MG PO CAPS
25.0000 mg | ORAL_CAPSULE | Freq: Once | ORAL | Status: AC
  Administered 2020-08-08: 25 mg via ORAL
  Filled 2020-08-08: qty 1

## 2020-08-08 NOTE — Evaluation (Signed)
Occupational Therapy Evaluation Patient Details Name: Madeline Brown MRN: [id number] DOB: [birth year] Today's Date: 08/08/2020    History of Present Illness 45 y/o female presented to ED for pain and swelling of L arm. She had revision of fistula done on 2/14. Patient found to be hypoxic in ED following dialysis on 2/21. PMH: anemia, CKD, HTN, SOB   Clinical Impression   Pt was independent with exception of styling her hair and tying her shoes prior to admission. She presents with edematous L UE. Educated to elevate and provided 3 pillows, mild balance impairment and decreased activity tolerance, requiring 3L 02 to maintain Sp02> 90%. Pt with HR in 140s with ambulation. Will follow acutely. Do not anticipate pt will require post acute OT.    Follow Up Recommendations  No OT follow up    Equipment Recommendations  None recommended by OT    Recommendations for Other Services       Precautions / Restrictions Precautions Precautions: Fall Precaution Comments: watch O2 Restrictions Weight Bearing Restrictions: No      Mobility Bed Mobility Overal bed mobility: Modified Independent             General bed mobility comments: HOB up    Transfers Overall transfer level: Needs assistance Equipment used: None Transfers: Sit to/from Stand Sit to Stand: Supervision         General transfer comment: supervision for safety    Balance Overall balance assessment: Mild deficits observed, not formally tested                                         ADL either performed or assessed with clinical judgement   ADL Overall ADL's : Needs assistance/impaired Eating/Feeding: Independent;Bed level   Grooming: Standing;Supervision/safety Grooming Details (indicate cue type and reason): assist for hair at baseline Upper Body Bathing: Set up;Sitting   Lower Body Bathing: Set up;Sitting/lateral leans   Upper Body Dressing : Set up;Sitting   Lower Body Dressing: Set  up;Sitting/lateral leans   Toilet Transfer: Min guard;Ambulation   Toileting- Clothing Manipulation and Hygiene: Sit to/from stand;Supervision/safety       Functional mobility during ADLs: Min guard       Vision Baseline Vision/History: No visual deficits       Perception     Praxis      Pertinent Vitals/Pain Pain Assessment: Faces Faces Pain Scale: No hurt     Hand Dominance Right   Extremity/Trunk Assessment Upper Extremity Assessment Upper Extremity Assessment: LUE deficits/detail;RUE deficits/detail RUE Deficits / Details: baseline hyperextensible joints LUE Deficits / Details: edematous with extensive surgical incision and staples, limited elbow ROM, impaired sensation on ulnar side of hand, dressed in gauze LUE Coordination: decreased gross motor   Lower Extremity Assessment Lower Extremity Assessment: Defer to PT evaluation   Cervical / Trunk Assessment Cervical / Trunk Assessment: Normal   Communication Communication Communication: No difficulties   Cognition Arousal/Alertness: Awake/alert Behavior During Therapy: WFL for tasks assessed/performed Overall Cognitive Status: Within Functional Limits for tasks assessed                                 General Comments: STM deficits at 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 stroke   General Comments      Exercises     Shoulder Instructions      Home Living Family/patient expects to  be discharged to:: Private residence Living Arrangements: Spouse/significant other;Children Available Help at Discharge: Family;Available 24 hours/day Type of Home: House Home Access: Stairs to enter [employer] of Steps: 6 Entrance Stairs-Rails: Right;Left;Can reach both Home Layout: Two level Alternate Level Stairs-Number of Steps: flight Alternate Level Stairs-Rails: Left Bathroom Shower/Tub: Teacher, early years/pre: Standard     Home Equipment: Grab bars - tub/shower          Prior  Functioning/Environment Level of Independence: Needs assistance  Gait / Transfers Assistance Needed: independent ADL's / Homemaking Assistance Needed: required assistance with hair and tying shoes. Anything that requires 2 hands            OT Problem List: Decreased activity tolerance;Impaired UE functional use      OT Treatment/Interventions: Self-care/ADL training;Therapeutic activities;Patient/family education    OT Goals(Current goals can be found in the care plan section) Acute Rehab OT Goals Patient Stated Goal: to go home OT Goal Formulation: With patient Time For Goal Achievement: 08/22/20 Potential to Achieve Goals: Good ADL Goals Additional ADL Goal #1: Pt will perform basice ADL modified independently. Additional ADL Goal #2: Pt will generalize energy conservation strategies in ADL. Additional ADL Goal #3: Pt will be knowlegeble in edema management strategies for L UE.  OT Frequency: Min 2X/week   Barriers to D/C:            Co-evaluation              AM-PAC OT "6 Clicks" Daily Activity     Outcome Measure Help from another person eating meals?: None Help from another person taking care of personal grooming?: A Lot Help from another person toileting, which includes using toliet, bedpan, or urinal?: A Little Help from another person bathing (including washing, rinsing, drying)?: A Little Help from another person to put on and taking off regular upper body clothing?: A Little Help from another person to put on and taking off regular lower body clothing?: A Little 6 Click Score: 18   End of Session Equipment Utilized During Treatment: Oxygen (3L)  Activity Tolerance: Treatment limited secondary to medical complications (Comment) (HR to 140s with ambulation) Patient left: in chair;with call bell/phone within reach;with chair alarm set  OT Visit Diagnosis: Unsteadiness on feet (R26.81);Muscle weakness (generalized) (M62.81);Other (comment) (decreased activity  tolerance)                Time: 3291-9166 OT Time Calculation (min): 29 min Charges:  OT General Charges $OT Visit: 1 Visit OT Evaluation $OT Eval Low Complexity: [place], OTR/L Acute Rehabilitation Services Pager: [phone] Office: [phone] Malka So 08/08/2020, 2:04 PM

## 2020-08-08 NOTE — Progress Notes (Signed)
[facility] Progress Note  Subjective: feeling a little better, seen on HD, nasal O2  Vitals:   08/08/20 0830 08/08/20 0900 08/08/20 0930 08/08/20 1000  BP: (!) 159/93 (!) 141/80 137/89 129/75  Pulse: 94 99 97 93  Resp:      Temp:      TempSrc:      SpO2:      Weight:        Exam:    alert, nad   no jvd  Chest cta bilat  Cor reg no RG  Abd soft ntnd no ascites   Ext no LE edema   Alert, NF, ox3   LUA AVF is wrapped after recent surg/ R IJ [facility]    Home meds:  - norvasc 10/ hydralazine 100 tid/ coreg 25 bid  - perceot qid prn/ keppra 1 gm qd  - renvela 2 ac tid  - prn's/ vitamins/ supplements  CXR 2/22 - IMPRESSION: Unchanged right lower lobe consolidation and small right pleural effusion.    OP HD: [facility]  3h 64min  350/500  56kg  2/2 bath  Hep none  RIJ TDC/ LUA AVF sp plication  - sensipar 30 tiw  - hect 2 ug tiw  - venofer 100 q hd , not started yet  - mircera 75 ug q4 wks, last 2/17     Assessment/ Plan: 1. RLL PNA - by CXR, per primary team, on IV abx. LG temp resolving.  2. Hypoxic resp failure, acute - likely due to combination of fluid excess and pna. Breathing seems to be improving. Rales improved. Lower dry wt w/ HD today as tolerated.  3. ESRD - on HD TTS.  HD today off schedule. Short HD tomorrow to get back on schedule.  4. HTN/ vol - bp/ vol still up, large UF today as tol 5. Anemia ckd - Hb 8.6, just got 2 wk esa 5 d ago. Hold IV Fe w/ infection.  6. MBD ckd - cont meds     Kelly Splinter 08/08/2020, 10:39 AM   Recent Labs  Lab 08/07/20 0020 08/07/20 0459  K 3.7 3.6  BUN 24* 31*  CREATININE 4.40* 4.98*  CALCIUM 10.3 9.5  HGB 9.3* 8.6*   Inpatient medications: . amLODipine  10 mg Oral QHS  . carvedilol  25 mg Oral BID WC  . Chlorhexidine Gluconate Cloth  6 each Topical Q0600  . [START ON 08/09/2020] cinacalcet  30 mg Oral Q T,Th,Sa-HD  . docusate sodium  100 mg Oral Daily  . [START ON 08/09/2020] doxercalciferol   2 mcg Intravenous Q T,Th,Sa-HD  . heparin  5,000 Units Subcutaneous Q8H  . hydrALAZINE  100 mg Oral TID  . levETIRAcetam  1,000 mg Oral Daily  . polyethylene glycol  17 g Oral Daily  . sevelamer carbonate  1,600 mg Oral TID WC   . sodium chloride    . sodium chloride    . azithromycin Stopped (08/07/20 1751)  . cefTRIAXone (ROCEPHIN)  IV Stopped (08/07/20 1556)   sodium chloride, sodium chloride, alteplase, heparin, lidocaine (PF), lidocaine-prilocaine, ondansetron (ZOFRAN) IV, oxyCODONE-acetaminophen, pentafluoroprop-tetrafluoroeth, sevelamer carbonate

## 2020-08-08 NOTE — Progress Notes (Signed)
[facility] PROGRESS NOTE    Progress Note  Karlee Staff  HDQ:[id number] DOB: 07-30-1975 DOA: 08/06/2020 PCP: Sonia Side., FNP     Brief Narrative:   Madeline Brown is an 45 y.o. female with past medical history of ESRD-on hemodialysis, hypertension presents recently discharged from the hospital on 2/16 following extensive revision of LUE aVF and complex wound closure.  He came into the ED in the morning of 2/21 with ongoing LUE pain/numbness as well as worsening dyspnea.  There was a concern of fluid overload so patient was dialyzed and was plan to discharge patient after dialysis however after dialysis patient noted to have new oxygen requirement and became shortness of breath when they attempted to stop oxygen.  Additionally patient running low-grade fever of 103 and tachycardic.  Repeat chest x-ray was obtained which reveals right lower lobe infiltrates is persistent.  Patient admitted for pneumonia     Assessment/Plan:   Acute respiratory failure with hypoxia secondarily to Right lower lobe pneumonia On admission she had a low-grade fever she was started empirically on Vanco and cefepime, she was requiring 3 to 4 L of oxygen to keep saturation greater than 92%. T-max of 99.1, has remained afebrile leukocytosis improving. Viral panel was negative. She defervesced her procalcitonin was 2.3. Continue empiric Rocephin and azithromycin. Check saturations with ambulation.  Stage renal disease on hemodialysis: Further management per renal.  Essential hypertension: Continue Coreg and hydralazine.  Status post recent extensive revision of her left AV fistula: Seen by vascular in 220 07/17/2020, no signs of infection.  Normocytic anemia/anemia of chronic disease: Likely due to end-stage renal disease hemoglobin stable.  Chronic thrombocytopenia: Hemoglobin stable no signs of bleeding.  History of seizure Continue Keppra.  Chronic symptoms the patient: Continue  MiraLAX and Colace.  Tobacco/marijuana use: Counseling.   DVT prophylaxis: lovenox Family Communication:none Status is: Inpatient  Remains inpatient appropriate because:Hemodynamically unstable   Dispo: The patient is from: Home              Anticipated d/c is to: Home              Anticipated d/c date is: 1 day              Patient currently is not medically stable to d/c.   Difficult to place patient No        Code Status:     Code Status Orders  (From admission, onward)         Start     Ordered   08/07/20 0319  Full code  Continuous        08/07/20 0320        Code Status History    Date Active Date Inactive Code Status Order ID Comments User Context   07/30/2020 1743 08/01/2020 1820 Full Code [id number]  Orbie Hurst Inpatient   12/07/2019 2251 12/09/2019 1825 Full Code [id number]  Madalyn Rob, MD ED   02/19/2019 1743 02/21/2019 2118 Full Code [id number]  Asencion Noble, MD ED   07/11/2018 0148 07/13/2018 1801 Full Code [id number]  Phillips Grout, MD ED   07/09/2018 0505 07/09/2018 1604 Full Code [id number]  Rise Patience, MD ED   06/19/2017 0253 06/27/2017 1823 Full Code [id number]  Germain Osgood, PA-C ED   02/13/2017 0807 02/19/2017 2005 Full Code [id number]  Jani Gravel, MD ED   Advance Care Planning Activity        IV Access:    Peripheral IV  Procedures and diagnostic studies:   DG Chest 1V REPEAT Same Day  Result Date: 08/07/2020 CLINICAL DATA:  Hypoxia with fever EXAM: CHEST - 1 VIEW SAME DAY COMPARISON:  08/06/2020 FINDINGS: Unchanged area of consolidation in the right lower lobe with small right pleural effusion. Unchanged position of right chest wall hemodialysis catheter. Mild cardiomegaly. IMPRESSION: Unchanged right lower lobe consolidation and small right pleural effusion. Electronically Signed   By: Ulyses Jarred M.D.   On: 08/07/2020 01:19     Medical Consultants:    None.   Subjective:    [name] she relates her  breathing is better but not back to baseline.  Objective:    Vitals:   08/08/20 0900 08/08/20 0930 08/08/20 1000 08/08/20 1100  BP: (!) 141/80 137/89 129/75 (!) 133/94  Pulse: 99 97 93 (!) 101  Resp:      Temp:    99.1 F (37.3 C)  TempSrc:    Oral  SpO2:    90%  Weight:       SpO2: 90 % O2 Flow Rate (L/min): 3 L/min FiO2 (%): (!) 0 %   Intake/Output Summary (Last 24 hours) at 08/08/2020 1142 Last data filed at 08/08/2020 0540 Gross per 24 hour  Intake 891.98 ml  Output --  Net 891.98 ml   Filed Weights   08/08/20 0734  Weight: 57.2 kg    Exam: General exam: In no acute distress. Respiratory system: Good air movement and bilateral crackles but mainly on the right side. Cardiovascular system: S1 & S2 heard, RRR. No JVD. Gastrointestinal system: Abdomen is nondistended, soft and nontender.  Extremities: No pedal edema. Skin: No rashes, lesions or ulcers   Data Reviewed:    Labs: Basic Metabolic Panel: Recent Labs  Lab 08/06/20 0405 08/07/20 0020 08/07/20 0459  NA 132* 136 135  K 3.9 3.7 3.6  CL 93* 94* 94*  CO2 24 28 27   GLUCOSE 109* 108* 110*  BUN 52* 24* 31*  CREATININE 7.73* 4.40* 4.98*  CALCIUM 9.7 10.3 9.5   GFR Estimated Creatinine Clearance: 11.5 mL/min (A) (by C-G formula based on SCr of 4.98 mg/dL (H)). Liver Function Tests: Recent Labs  Lab 08/06/20 0405  AST 22  ALT 8  ALKPHOS 40  BILITOT 1.3*  PROT 7.1  ALBUMIN 3.5   No results for input(s): LIPASE, AMYLASE in the last 168 hours. No results for input(s): AMMONIA in the last 168 hours. Coagulation profile No results for input(s): INR, PROTIME in the last 168 hours. COVID-19 Labs  No results for input(s): DDIMER, FERRITIN, LDH, CRP in the last 72 hours.  Lab Results  Component Value Date   SARSCOV2NAA NEGATIVE 08/06/2020   [place] NEGATIVE 07/30/2020   [place] NEGATIVE 07/25/2020   [place] NEGATIVE 12/07/2019    CBC: Recent Labs  Lab 08/06/20 0405  08/07/20 0020 08/07/20 0459  WBC 7.3 8.8 6.5  NEUTROABS 6.0  --   --   HGB 9.5* 9.3* 8.6*  HCT 28.6* 27.8* 25.7*  MCV 100.0 98.6 100.8*  PLT 103* 121* 102*   Cardiac Enzymes: No results for input(s): CKTOTAL, CKMB, CKMBINDEX, TROPONINI in the last 168 hours. BNP (last 3 results) No results for input(s): PROBNP in the last 8760 hours. CBG: Recent Labs  Lab 08/07/20 0107  GLUCAP 95   D-Dimer: No results for input(s): DDIMER in the last 72 hours. Hgb A1c: No results for input(s): HGBA1C in the last 72 hours. Lipid Profile: No results for input(s): CHOL, HDL, LDLCALC, TRIG, CHOLHDL, LDLDIRECT  in the last 72 hours. Thyroid function studies: No results for input(s): TSH, T4TOTAL, T3FREE, THYROIDAB in the last 72 hours.  Invalid input(s): FREET3 Anemia work up: No results for input(s): VITAMINB12, FOLATE, FERRITIN, TIBC, IRON, RETICCTPCT in the last 72 hours. Sepsis Labs: Recent Labs  Lab 08/06/20 0405 08/07/20 0020 08/07/20 0054 08/07/20 0459  PROCALCITON  --  2.30  --   --   WBC 7.3 8.8  --  6.5  LATICACIDVEN  --   --  1.3  --    Microbiology Recent Results (from the past 240 hour(s))  SARS Coronavirus 2 by RT PCR (hospital order, performed in [facility] hospital lab) Nasopharyngeal Nasopharyngeal Swab     Status: None   Collection Time: 07/30/20  6:46 AM   Specimen: Nasopharyngeal Swab  Result Value Ref Range Status   SARS Coronavirus 2 NEGATIVE NEGATIVE Final    Comment: (NOTE) SARS-CoV-2 target nucleic acids are NOT DETECTED.  The SARS-CoV-2 RNA is generally detectable in upper and lower respiratory specimens during the acute phase of infection. The lowest concentration of SARS-CoV-2 viral copies this assay can detect is 250 copies / mL. A negative result does not preclude SARS-CoV-2 infection and should not be used as the sole basis for treatment or other patient management decisions.  A negative result may occur with improper specimen collection / handling,  submission of specimen other than nasopharyngeal swab, presence of viral mutation(s) within the areas targeted by this assay, and inadequate number of viral copies (<250 copies / mL). A negative result must be combined with clinical observations, patient history, and epidemiological information.  Fact Sheet for Patients:   StrictlyIdeas.no  Fact Sheet for Healthcare Providers: BankingDealers.co.za  This test is not yet approved or  cleared by the [place] FDA and has been authorized for detection and/or diagnosis of SARS-CoV-2 by FDA under an Emergency Use Authorization (EUA).  This EUA will remain in effect (meaning this test can be used) for the duration of the COVID-19 declaration under Section 564(b)(1) of the Act, 21 U.S.C. section 360bbb-3(b)(1), unless the authorization is terminated or revoked sooner.  Performed at [facility], [place] [address] [place], [place] [address]   Resp Panel by RT-PCR (Flu A&B, Covid) Nasopharyngeal Swab     Status: None   Collection Time: 08/06/20  6:13 AM   Specimen: Nasopharyngeal Swab; Nasopharyngeal(NP) swabs in vial transport medium  Result Value Ref Range Status   SARS Coronavirus 2 by RT PCR NEGATIVE NEGATIVE Final    Comment: (NOTE) SARS-CoV-2 target nucleic acids are NOT DETECTED.  The SARS-CoV-2 RNA is generally detectable in upper respiratory specimens during the acute phase of infection. The lowest concentration of SARS-CoV-2 viral copies this assay can detect is 138 copies/mL. A negative result does not preclude SARS-Cov-2 infection and should not be used as the sole basis for treatment or other patient management decisions. A negative result may occur with  improper specimen collection/handling, submission of specimen other than nasopharyngeal swab, presence of viral mutation(s) within the areas targeted by this assay, and inadequate number of viral copies(<138 copies/mL).  A negative result must be combined with clinical observations, patient history, and epidemiological information. The expected result is Negative.  Fact Sheet for Patients:  EntrepreneurPulse.com.au  Fact Sheet for Healthcare Providers:  IncredibleEmployment.be  This test is no t yet approved or cleared by the [place] FDA and  has been authorized for detection and/or diagnosis of SARS-CoV-2 by FDA under an Emergency Use Authorization (EUA).  This EUA will remain  in effect (meaning this test can be used) for the duration of the COVID-19 declaration under Section 564(b)(1) of the Act, 21 U.S.C.section 360bbb-3(b)(1), unless the authorization is terminated  or revoked sooner.       Influenza A by PCR NEGATIVE NEGATIVE Final   Influenza B by PCR NEGATIVE NEGATIVE Final    Comment: (NOTE) The Xpert Xpress SARS-CoV-2/FLU/RSV plus assay is intended as an aid in the diagnosis of influenza from Nasopharyngeal swab specimens and should not be used as a sole basis for treatment. Nasal washings and aspirates are unacceptable for Xpert Xpress SARS-CoV-2/FLU/RSV testing.  Fact Sheet for Patients: EntrepreneurPulse.com.au  Fact Sheet for Healthcare Providers: IncredibleEmployment.be  This test is not yet approved or cleared by the [place] FDA and has been authorized for detection and/or diagnosis of SARS-CoV-2 by FDA under an Emergency Use Authorization (EUA). This EUA will remain in effect (meaning this test can be used) for the duration of the COVID-19 declaration under Section 564(b)(1) of the Act, 21 U.S.C. section 360bbb-3(b)(1), unless the authorization is terminated or revoked.  Performed at [facility], [place] [address] [place], [place] [address]   Culture, blood (Routine X 2) w Reflex to ID Panel     Status: None (Preliminary result)   Collection Time: 08/07/20 12:41 AM   Specimen: BLOOD   Result Value Ref Range Status   Specimen Description BLOOD RIGHT ANTECUBITAL  Final   Special Requests   Final    BOTTLES DRAWN AEROBIC AND ANAEROBIC Blood Culture adequate volume   Culture   Final    NO GROWTH 1 DAY Performed at [facility], [place] [address], [place], [place] [address]    Report Status PENDING  Incomplete  Culture, blood (Routine X 2) w Reflex to ID Panel     Status: None (Preliminary result)   Collection Time: 08/07/20 12:44 AM   Specimen: BLOOD RIGHT WRIST  Result Value Ref Range Status   Specimen Description BLOOD RIGHT WRIST  Final   Special Requests   Final    BOTTLES DRAWN AEROBIC AND ANAEROBIC Blood Culture adequate volume   Culture   Final    NO GROWTH 1 DAY Performed at [facility], [place] [address] [place], [place] [address]    Report Status PENDING  Incomplete  MRSA PCR Screening     Status: None   Collection Time: 08/07/20  6:33 AM   Specimen: Nasal Mucosa; Nasopharyngeal  Result Value Ref Range Status   MRSA by PCR NEGATIVE NEGATIVE Final    Comment:        The GeneXpert MRSA Assay (FDA approved for NASAL specimens only), is one component of a comprehensive MRSA colonization surveillance program. It is not intended to diagnose MRSA infection nor to guide or monitor treatment for MRSA infections. Performed at [facility], [place] [address] [place], [place] [address]      Medications:   . amLODipine  10 mg Oral QHS  . carvedilol  25 mg Oral BID WC  . Chlorhexidine Gluconate Cloth  6 each Topical Q0600  . [START ON 08/09/2020] cinacalcet  30 mg Oral Q T,Th,Sa-HD  . docusate sodium  100 mg Oral Daily  . [START ON 08/09/2020] doxercalciferol  2 mcg Intravenous Q T,Th,Sa-HD  . heparin  5,000 Units Subcutaneous Q8H  . hydrALAZINE  100 mg Oral TID  . levETIRAcetam  1,000 mg Oral Daily  . polyethylene glycol  17 g Oral  Daily  . sevelamer carbonate  1,600 mg Oral TID WC   Continuous Infusions: . azithromycin Stopped  (08/07/20 1751)  . cefTRIAXone (ROCEPHIN)  IV Stopped (08/07/20 1556)      LOS: 1 day   Charlynne Cousins  [facility]  08/08/2020, 11:42 AM

## 2020-08-08 NOTE — Progress Notes (Signed)
OT Cancellation Note  Patient Details Name: Madeline Brown MRN: [id number] DOB: [birth year]   Cancelled Treatment:    Reason Eval/Treat Not Completed: Patient at procedure or test/ unavailable (Pt currently in HD.) Will follow.  Malka So 08/08/2020, 8:08 AM  Nestor Lewandowsky, OTR/L Acute Rehabilitation Services Pager: [phone] Office: [phone]

## 2020-08-08 NOTE — TOC Initial Note (Addendum)
Transition of Care [facility]) - Initial/Assessment Note    Patient Details  Name: Madeline Brown MRN: [id number] Date of Birth: October 09, 1975  Transition [facility]) CM/SW Contact:    Marilu Favre, RN Phone Number: 08/08/2020, 3:12 PM  Clinical Narrative:                  Patient from home with boyfriend and daughters. One daughter is 44 years old , one is 61 years old.   Disccused PT recommendations for home health PT. Patient in agreement. Referral made to [facility] with [facility] awaiting call back.     Also discussed home oxygen. If needed at discharge [facility] will bring portable tank to her hospital room prior to discharge and show her how to use it.   Patient voiced understanding.  Patient has a two story home. NCM messaged PT to see if she can practice steps prior to discharge. PT already planned to do stairs tomorrow. Expected Discharge Plan: [place]     Patient Goals and CMS Choice Patient states their goals for this hospitalization and ongoing recovery are:: to return to home CMS Medicare.gov Compare Post Acute Care list provided to:: Patient Choice offered to / list presented to : Patient  Expected Discharge Plan and Services Expected Discharge Plan: [place]   Discharge Planning Services: CM Consult Post Acute Care Choice: [place] arrangements for the past 2 months: Apartment                   DME Agency: NA       HH Arranged: PT HH Agency: [place] Date [place]: 08/08/20 Time [place]: [id number] Representative spoke with at [place]: Tommi Rumps  Prior Living Arrangements/Services Living arrangements for the past 2 months: [place] with:: Adult Children,Significant Other Patient language and need for interpreter reviewed:: Yes Do you feel safe going back to the place where you live?: Yes      Need for Family Participation in Patient Care: Yes (Comment) Care  giver support system in place?: Yes (comment)   Criminal Activity/Legal Involvement Pertinent to Current Situation/Hospitalization: No - Comment as needed  Activities of Daily Living      Permission Sought/Granted   Permission granted to share information with : No              Emotional Assessment Appearance:: Appears stated age Attitude/Demeanor/Rapport: Engaged Affect (typically observed): Accepting Orientation: : Oriented to Self,Oriented to Place,Oriented to  Time,Oriented to Situation Alcohol / Substance Use: Not Applicable Psych Involvement: No (comment)  Admission diagnosis:  Hypoxia [R09.02] Anemia associated with chronic renal failure [N18.9, D63.1] Paresthesia of left arm [R20.2] End-stage renal disease on hemodialysis ([place]) [N18.6, Z99.2] Acute respiratory failure with hypoxia ([place]) [J96.01] HCAP (healthcare-associated pneumonia) [J18.9] Left arm swelling [M79.89] Patient Active Problem List   Diagnosis Date Noted  . Right lower lobe pneumonia 08/07/2020  . Fluid overload, unspecified 12/09/2019  . Acute respiratory failure with hypoxia ([place]) 12/08/2019  . Hypertensive urgency 12/07/2019  . A-V fistula ([place]) 11/28/2019  . Current smoker 11/28/2019  . History of intracranial hemorrhage 11/28/2019  . Transplanted organ and tissue status, 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/15/2019  . Intracerebral hemorrhage 02/21/2019  . Intracranial hematoma ([place]) 02/20/2019  . Seizure ([place]) 02/19/2019  . Pain, unspecified 08/23/2018  . Shortness of breath 07/15/2018  . Pruritus, unspecified 10/28/2017  . Headache, unspecified 09/26/2017  . Anemia in chronic kidney disease 06/30/2017  .  Coagulation defect, unspecified ([place]) 06/30/2017  . Hypertensive chronic kidney disease with stage 1 through stage 4 chronic kidney disease, or unspecified chronic kidney disease 06/30/2017  . Iron deficiency anemia, unspecified 06/30/2017  . Encounter for fitting and adjustment of extracorporeal dialysis  catheter ([place]) 06/30/2017  . Secondary hyperparathyroidism of renal origin ([place]) 06/30/2017  . ESRD (end stage renal disease) ([place]) 06/24/2017  . Hypertension 03/23/2017  . Anemia 02/12/2017   PCP:  Sonia Side., FNP Pharmacy:   [facility] [address] [place]), [place] - [address] [place] ([place] [address] Phone: [phone] Fax: [phone]  [facility] [place], [place] - [address] [place], [address] [place] [address] Phone: [phone] Fax: [phone]  [name] [place], [place] - [address] [place] [address] Phone: [phone] Fax: [phone]     Social Determinants of Health (SDOH) Interventions    Readmission Risk Interventions No flowsheet data found.

## 2020-08-08 NOTE — Plan of Care (Signed)
  Problem: Education: Goal: Knowledge of General Education information will improve Description Including pain rating scale, medication(s)/side effects and non-pharmacologic comfort measures Outcome: Progressing   

## 2020-08-08 NOTE — Evaluation (Signed)
Physical Therapy Evaluation Patient Details Name: Madeline Brown MRN: [id number] DOB: December 07, 1975 Today's Date: 08/08/2020   History of Present Illness  45 y/o female presented to ED for pain and swelling of L arm. She had revision of fistula done on 2/14. Patient found to be hypoxic in ED following dialysis on 2/21. PMH: anemia, CKD, HTN, SOB  Clinical Impression  PTA, patient lives with husband and children and reports independence with mobility. Patient presents with impaired balance, decreased activity tolerance, generalized weakness. See O2 saturation note for oxygen requirement during ambulation. Patient overall supervision for OOB mobility due to balance deficits. Patient will benefit from skilled PT services during acute stay to address listed deficits. Recommend HHPT following discharge to maximize functional mobility and endurance and safety.     Follow Up Recommendations Home health PT    Equipment Recommendations  None recommended by PT    Recommendations for Other Services       Precautions / Restrictions Precautions Precautions: Fall Precaution Comments: watch O2 Restrictions Weight Bearing Restrictions: No      Mobility  Bed Mobility Overal bed mobility: Modified Independent                  Transfers Overall transfer level: Needs assistance Equipment used: None Transfers: Sit to/from Stand Sit to Stand: Supervision         General transfer comment: supervision for safety  Ambulation/Gait Ambulation/Gait assistance: Supervision Gait Distance (Feet): 200 Feet Assistive device: None Gait Pattern/deviations: Step-through pattern;Decreased stride length;Drifts right/left Gait velocity: decreased   General Gait Details: ambulated with 2L O2 [place] with patient able to maintain 91% throughout. HR in 140s. Drifts L/R with ambulation and x1 LOB with patient able to recover with no physical assist  Stairs            Wheelchair Mobility    Modified  Rankin (Stroke Patients Only)       Balance Overall balance assessment: Mild deficits observed, not formally tested                                           Pertinent Vitals/Pain Pain Assessment: Faces Faces Pain Scale: No hurt    Home Living Family/patient expects to be discharged to:: Private residence Living Arrangements: Spouse/significant other;Children Available Help at Discharge: Family;Available 24 hours/day Type of Home: House Home Access: Stairs to enter Entrance Stairs-Rails: Right;Left;Can reach both Entrance Stairs-Number of Steps: 6 Home Layout: Two level Home Equipment: Grab bars - tub/shower      Prior Function Level of Independence: Needs assistance   Gait / Transfers Assistance Needed: independent  ADL's / Homemaking Assistance Needed: required assistance with hair and tying shoes. Anything that requires 2 hands        Hand Dominance        Extremity/Trunk Assessment   Upper Extremity Assessment Upper Extremity Assessment: Defer to OT evaluation    Lower Extremity Assessment Lower Extremity Assessment: Overall WFL for tasks assessed       Communication   Communication: No difficulties  Cognition Arousal/Alertness: Awake/alert Behavior During Therapy: WFL for tasks assessed/performed Overall Cognitive Status: Within Functional Limits for tasks assessed                                 General Comments: STM deficits at 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 stroke  General Comments General comments (skin integrity, edema, etc.): Patient on 2L O2 [place] upon arrival with spO2 86%, increased to 3L with spO2 increased to 95%. Upon standing, spO2 100% on 3L so weaned to 2L. Ambulated 200' with 2L O2 with spO2 maintaining 91% throughout    Exercises     Assessment/Plan    PT Assessment Patient needs continued PT services  PT Problem List Decreased strength;Decreased activity tolerance;Decreased balance;Decreased  mobility;Cardiopulmonary status limiting activity       PT Treatment Interventions DME instruction;Gait training;Stair training;Functional mobility training;Therapeutic exercise;Therapeutic activities;Balance training;Patient/family education    PT Goals (Current goals can be found in the Care Plan section)  Acute Rehab PT Goals Patient Stated Goal: to go home PT Goal Formulation: With patient Time For Goal Achievement: 08/22/20 Potential to Achieve Goals: Good    Frequency Min 3X/week   Barriers to discharge        Co-evaluation               AM-PAC PT "6 Clicks" Mobility  Outcome Measure Help needed turning from your back to your side while in a flat bed without using bedrails?: None Help needed moving from lying on your back to sitting on the side of a flat bed without using bedrails?: None Help needed moving to and from a bed to a chair (including a wheelchair)?: A Little Help needed standing up from a chair using your arms (e.g., wheelchair or bedside chair)?: A Little Help needed to walk in hospital room?: A Little Help needed climbing 3-5 steps with a railing? : A Little 6 Click Score: 20    End of Session Equipment Utilized During Treatment: Oxygen Activity Tolerance: Patient tolerated treatment well Patient left: in chair;with call bell/phone within reach Nurse Communication: Mobility status PT Visit Diagnosis: Unsteadiness on feet (R26.81);Muscle weakness (generalized) (M62.81)    Time: 5035-4656 PT Time Calculation (min) (ACUTE ONLY): 34 min   Charges:   PT Evaluation $PT Eval Low Complexity: 1 Low          Alayna A. Gilford Rile PT, DPT Acute Rehabilitation Services Pager [phone] Office [phone]   Linna Hoff 08/08/2020, 1:24 PM

## 2020-08-08 NOTE — Progress Notes (Signed)
SATURATION QUALIFICATIONS: (This note is used to comply with regulatory documentation for home oxygen)  Patient Saturations on 2L O2 at Rest = 86%  Patient Saturations on 3L O2 at Rest = 95%  Patient Saturations on 2 Liters of oxygen while Ambulating = 91%  Please briefly explain why patient needs home oxygen: Patient requires supplemental oxygen at this time due to need for O2 to keep spO2 >90% during ambulation and at rest.

## 2020-08-08 NOTE — Progress Notes (Signed)
[facility] Progress Note  Subjective: feeling a little better, seen on HD, nasal O2  Vitals:   08/08/20 1000 08/08/20 1030 08/08/20 1039 08/08/20 1100  BP: 129/75 124/82 117/76 (!) 133/94  Pulse: 93   (!) 101  Resp:  19 17   Temp:   99.2 F (37.3 C) 99.1 F (37.3 C)  TempSrc:   Oral Oral  SpO2:   100% 90%  Weight:   54.9 kg     Exam:    alert, nad   no jvd  Chest cta bilat  Cor reg no RG  Abd soft ntnd no ascites   Ext no LE edema   Alert, NF, ox3   LUA AVF is wrapped after recent surg/ R IJ [facility]    Home meds:  - norvasc 10/ hydralazine 100 tid/ coreg 25 bid  - perceot qid prn/ keppra 1 gm qd  - renvela 2 ac tid  - prn's/ vitamins/ supplements  CXR 2/22 - IMPRESSION: Unchanged right lower lobe consolidation and small right pleural effusion.    OP HD: [facility]  3h 58min  350/500  56kg  2/2 bath  Hep none  RIJ TDC/ LUA AVF sp plication  - sensipar 30 tiw  - hect 2 ug tiw  - venofer 100 q hd , not started yet  - mircera 75 ug q4 wks, last 2/17     Assessment/ Plan: 1. RLL PNA - by CXR, per primary team, on IV abx. LG temp resolving.  2. Hypoxic resp failure, acute - likely due to combination of fluid excess and pna. Breathing seems to be improving. Rales improved. Lower dry wt w/ HD today as tolerated.  3. ESRD - on HD TTS.  HD today off schedule. Short HD tomorrow to get back on schedule.  4. HTN/ vol - bp/ vol still up, large UF today as tol 5. Anemia ckd - Hb 8.6, just got 2 wk esa 5 d ago. Hold IV Fe w/ infection.  6. MBD ckd - cont meds     Madeline Brown 08/08/2020, 4:49 PM   Recent Labs  Lab 08/07/20 0020 08/07/20 0459  K 3.7 3.6  BUN 24* 31*  CREATININE 4.40* 4.98*  CALCIUM 10.3 9.5  HGB 9.3* 8.6*   Inpatient medications: . amLODipine  10 mg Oral QHS  . azithromycin  500 mg Oral Q24H  . carvedilol  25 mg Oral BID WC  . Chlorhexidine Gluconate Cloth  6 each Topical Q0600  . [START ON 08/09/2020] cinacalcet  30 mg Oral Q  T,Th,Sa-HD  . docusate sodium  100 mg Oral Daily  . [START ON 08/09/2020] doxercalciferol  2 mcg Intravenous Q T,Th,Sa-HD  . heparin  5,000 Units Subcutaneous Q8H  . hydrALAZINE  100 mg Oral TID  . levETIRAcetam  1,000 mg Oral Daily  . polyethylene glycol  17 g Oral Daily  . sevelamer carbonate  1,600 mg Oral TID WC   . cefTRIAXone (ROCEPHIN)  IV 1 g (08/08/20 1432)   ondansetron (ZOFRAN) IV, oxyCODONE-acetaminophen, sevelamer carbonate

## 2020-08-09 DIAGNOSIS — D631 Anemia in chronic kidney disease: Secondary | ICD-10-CM

## 2020-08-09 DIAGNOSIS — N189 Chronic kidney disease, unspecified: Secondary | ICD-10-CM

## 2020-08-09 MED ORDER — AMOXICILLIN-POT CLAVULANATE 875-125 MG PO TABS: 1.0000 | ORAL_TABLET | Freq: Two times a day (BID) | ORAL | 0 refills | Status: AC

## 2020-08-09 MED ORDER — AZITHROMYCIN 250 MG PO TABS: ORAL_TABLET | ORAL | 0 refills | Status: DC

## 2020-08-09 MED ORDER — AMOXICILLIN-POT CLAVULANATE 875-125 MG PO TABS: 1.0000 | ORAL_TABLET | Freq: Two times a day (BID) | ORAL | 0 refills | Status: DC

## 2020-08-09 NOTE — Progress Notes (Addendum)
Renal Navigator notes discharge order and summary in patient's chart. Patient is due for HD and can go to outpatient clinic if all is arranged for her. Navigator spoke with medical team and all are in agreement. Nephrologist met with patient and discussed plan. Navigator also met with patient and her boyfriend who can transport her. Patient is in agreement for HD treatment at outpatient clinic[facility] today. She needs to be there by 12:15pm for a 12:35pm seat time. Renal PA will send orders. Navigator is greatly appreciative of team effort to discharge patient in a timely manner in order to accommodate outpatient HD today so we can keep inpatient unit clear for patients who cannot receive treatment elsewhere. Navigator states appreciation to patient also.  Alphonzo Cruise, [place] [facility]al Navigator [phone]

## 2020-08-09 NOTE — Progress Notes (Signed)
[name] to be D/C'd per MD order. Discussed with the patient and all questions fully answered. ? VSS, Skin clean, dry and intact without evidence of skin break down, no evidence of skin tears noted. ? IV catheter discontinued intact. Site without signs and symptoms of complications. Dressing and pressure applied. ? An After Visit Summary was printed and given to the patient. Patient informed where to pickup prescriptions. ? D/C education completed with patient/family including follow up instructions, medication list, d/c activities limitations if indicated, with other d/c instructions as indicated by MD - patient able to verbalize understanding, all questions fully answered.  ? Patient instructed to return to ED, call 911, or call MD for any changes in condition.  ? Patient to be escorted via [place], and D/C home via private auto.

## 2020-08-09 NOTE — Progress Notes (Signed)
Physical Therapy Treatment Patient Details Name: Madeline Brown MRN: [id number] DOB: 11-07-1975 Today's Date: 08/09/2020    History of Present Illness 45 y/o female presented to ED for pain and swelling of L arm. She had revision of fistula done on 2/14. Patient found to be hypoxic in ED following dialysis on 2/21. PMH: anemia, CKD, HTN, SOB    PT Comments    Patient progressing towards physical therapy goals. Patient on 1L O2 [place] throughout session with spO2 maintaining 90-93% throughout. On stair descent, drop in spO2 to 88% with patient able to increase to >90% with pursed lip breathing.  Assessed patient's spO2 on RA at beginning of session, however patient unable to maintain spO2 >90% and drop below 90% within 30 seconds. Continue to recommend HHPT following d/c to maximize mobility/balance and independence.    Follow Up Recommendations  Home health PT     Equipment Recommendations  None recommended by PT    Recommendations for Other Services       Precautions / Restrictions Precautions Precautions: Fall Precaution Comments: watch O2 Restrictions Weight Bearing Restrictions: No    Mobility  Bed Mobility Overal bed mobility: Modified Independent                  Transfers Overall transfer level: Modified independent Equipment used: None                Ambulation/Gait Ambulation/Gait assistance: Supervision Gait Distance (Feet): 200 Feet Assistive device: None Gait Pattern/deviations: Step-through pattern;Decreased stride length;Drifts right/left Gait velocity: decreased   General Gait Details: ambulated with 1L O2 [place] with patient able to maintain >90% throughout   Stairs Stairs: Yes Stairs assistance: Supervision Stair Management: One rail Left;Alternating pattern;Forwards Number of Stairs: 10 General stair comments: on 1L O2 [place] with spO2 >90% on ascent and standing rest break at top of stairs. On descent, spO2 dropped to 88% with patient able to  increase spO2 >90% with pursed lip breathing.   Wheelchair Mobility    Modified Rankin (Stroke Patients Only)       Balance Overall balance assessment: Mild deficits observed, not formally tested                                          Cognition Arousal/Alertness: Awake/alert Behavior During Therapy: WFL for tasks assessed/performed Overall Cognitive Status: Within Functional Limits for tasks assessed                                 General Comments: STM deficits at 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 stroke      Exercises      General Comments        Pertinent Vitals/Pain Pain Assessment: No/denies pain    Home Living                      Prior Function            PT Goals (current goals can now be found in the care plan section) Acute Rehab PT Goals Patient Stated Goal: to go home PT Goal Formulation: With patient Time For Goal Achievement: 08/22/20 Potential to Achieve Goals: Good Progress towards PT goals: Progressing toward goals    Frequency    Min 3X/week      PT Plan Current plan remains appropriate    Co-evaluation  AM-PAC PT "6 Clicks" Mobility   Outcome Measure  Help needed turning from your back to your side while in a flat bed without using bedrails?: None Help needed moving from lying on your back to sitting on the side of a flat bed without using bedrails?: None Help needed moving to and from a bed to a chair (including a wheelchair)?: None Help needed standing up from a chair using your arms (e.g., wheelchair or bedside chair)?: None Help needed to walk in hospital room?: A Little Help needed climbing 3-5 steps with a railing? : A Little 6 Click Score: 22    End of Session Equipment Utilized During Treatment: Oxygen Activity Tolerance: Patient tolerated treatment well Patient left: in bed;with call bell/phone within reach Nurse Communication: Mobility status PT Visit Diagnosis:  Unsteadiness on feet (R26.81);Muscle weakness (generalized) (M62.81)     Time: 1583-0940 PT Time Calculation (min) (ACUTE ONLY): 24 min  Charges:  $Therapeutic Activity: 23-37 mins                     Abriel Hattery A. Gilford Rile PT, DPT Acute Rehabilitation Services Pager [phone] Office [phone]    Linna Hoff 08/09/2020, 9:23 AM

## 2020-08-09 NOTE — Discharge Summary (Addendum)
Physician Discharge Summary  Madeline Brown TDD:[id number] DOB: 1975/12/21 DOA: 08/06/2020  PCP: Sonia Side., FNP  Admit date: 08/06/2020 Discharge date: 08/09/2020  Admitted From: Home Disposition:  Home  Recommendations for Outpatient Follow-up:  1. Follow up with PCP in 1-2 weeks Please obtain BMP/CBC in one week Home Health:Yes Equipment/Devices:Oxygen 3 L at home for 2 weeks  Discharge Condition:Stable CODE STATUS:Full Diet recommendation: Heart Healthy   Brief/Interim Summary: 45 y.o. female with past medical history of ESRD-on hemodialysis, hypertension presents recently discharged from the hospital on 2/16 following extensive revision of LUE aVF and complex wound closure. He came into the ED in the morning of 2/21 with ongoing LUE pain/numbness as well as worsening dyspnea. There was a concern of fluid overload so patient was dialyzed and was plan to discharge patient after dialysis however after dialysis patient noted to have new oxygen requirement and became shortness of breath when they attempted to stop oxygen. Additionally patient running low-grade fever of 103 and tachycardic.   Discharge Diagnoses:  Principal Problem:   Right lower lobe pneumonia Active Problems:   Hypertension   ESRD (end stage renal disease) (HCC)   Seizure (HCC)   Acute respiratory failure with hypoxia (HCC) Acute respiratory failure with hypoxia due to right lower lobe pneumonia and volume overload: On admission she had a low-grade fever she was started on IV vancomycin and cefepime and placed on 4 L of oxygen. She defervesced her leukocytosis improved. Viral panel was negative.  She was ambulated and her saturations drop with ambulation she will go home on 2 weeks of oxygen. She also continue Augmentin and azithromycin for total of 5 days as an outpatient.  End-stage renal disease on hemodialysis: She will continue her dialysis treatment Tuesday Thursdays and Saturdays. She is getting 1  treatment of dialysis before discharge and she will follow on her regular schedule.  Essential hypertension: Continue Coreg and hydralazine.  Status post recent extensive revision of her AV left fistula: Seen by vascular on 08/06/2020 no signs of infection.  Normocytic anemia/anemia of chronic disease: Likely due to end-stage renal disease continue to follow-up as an outpatient.  Chronic thrombocytopenia, No signs of overt bleeding. Platelets remain relatively stable.  History of seizures: No events continue Keppra.  Chronic constipation: Continue MiraLAX and Colace.   Discharge Instructions  Discharge Instructions    Diet - low sodium heart healthy   Complete by: As directed    Increase activity slowly   Complete by: As directed    No wound care   Complete by: As directed      Allergies as of 08/09/2020      Reactions   Visine [tetrahydrozoline Hcl] Swelling   Eyes Swelling      Medication List    STOP taking these medications   acetaminophen 500 MG tablet Commonly known as: TYLENOL     TAKE these medications   amLODipine 10 MG tablet Commonly known as: NORVASC Take 1 tablet (10 mg total) by mouth at bedtime.   amoxicillin-clavulanate 875-125 MG tablet Commonly known as: Augmentin Take 1 tablet by mouth every 12 (twelve) hours for 3 days.   azithromycin 250 MG tablet Commonly known as: ZITHROMAX Take one tab daily   carvedilol 25 MG tablet Commonly known as: COREG Take 1 tablet (25 mg total) by mouth 2 (two) times daily with a meal.   DIALYVITE TABLET Tabs Take 1 tablet by mouth daily.   diphenhydrAMINE 25 MG tablet Commonly known as: BENADRYL Take 25  mg by mouth every 6 (six) hours as needed for itching or sleep.   hydrALAZINE 100 MG tablet Commonly known as: APRESOLINE Take 1 tablet (100 mg total) by mouth 3 (three) times daily.   levETIRAcetam 1000 MG tablet Commonly known as: KEPPRA Take 1,000 mg by mouth daily.    oxyCODONE-acetaminophen 5-325 MG tablet Commonly known as: PERCOCET/ROXICET Take 1 tablet by mouth every 4 (four) hours as needed for moderate pain. What changed: how much to take   sevelamer carbonate 800 MG tablet Commonly known as: RENVELA Take 2 tablets (1,600 mg total) by mouth 3 (three) times daily with meals. What changed:   when to take this  additional instructions   spironolactone 50 MG tablet Commonly known as: ALDACTONE Take 1 tablet (50 mg total) by mouth daily.            Durable Medical Equipment  (From admission, onward)         Start     Ordered   08/09/20 0908  For home use only DME oxygen  Once       Question Answer Comment  Length of Need 6 Months   Mode or (Route) Nasal cannula   Liters per Minute 3   Frequency Continuous (stationary and portable oxygen unit needed)   Oxygen delivery system Gas      08/09/20 0907          Follow-up Information    [facility] Follow up on 08/14/2020.   Specialty: Vascular Surgery Contact information: [address] [place] [address] [place] [address] [place], [facility] Follow up.   Specialty: Home Health Services Contact information: [address] [place] [address] [phone]              Allergies  Allergen Reactions  . Visine [Tetrahydrozoline Hcl] Swelling    Eyes Swelling    Consultations:  Nephrology  Vascular surgery   Procedures/Studies: DG Chest 1V REPEAT Same Day  Result Date: 08/07/2020 CLINICAL DATA:  Hypoxia with fever EXAM: CHEST - 1 VIEW SAME DAY COMPARISON:  08/06/2020 FINDINGS: Unchanged area of consolidation in the right lower lobe with small right pleural effusion. Unchanged position of right chest wall hemodialysis catheter. Mild cardiomegaly. IMPRESSION: Unchanged right lower lobe consolidation and small right pleural effusion. Electronically Signed   By: Ulyses Jarred M.D.   On: 08/07/2020  01:19   DG Chest Port 1 View  Result Date: 08/06/2020 CLINICAL DATA:  Shortness of breath EXAM: PORTABLE CHEST 1 VIEW COMPARISON:  Seven days ago FINDINGS: Dialysis catheter with tip at the upper cavoatrial junction. Cardiomegaly and congested appearance of hilar vessels. No definite effusion, edema, or pneumothorax. Airspace opacity at the bases asymmetric to the right. IMPRESSION: 1. Pulmonary opacity at the bases with asymmetry to the right more worrisome for pneumonia than edema. 2. Vascular congestion. Electronically Signed   By: Monte Fantasia M.D.   On: 08/06/2020 04:44   DG Chest Port 1V same Day  Result Date: 07/30/2020 CLINICAL DATA:  Postop. EXAM: PORTABLE CHEST 1 VIEW COMPARISON:  12/07/2019 radiograph and CT chest. FINDINGS: Right IJ dialysis catheter tip is in the low SVC or at the SVC RA junction. Pneumothorax. Streaky densities are seen in the medial lung bases. Lungs are otherwise clear. Tiny bilateral pleural effusions. IMPRESSION: 1. Right IJ dialysis catheter placement without pneumothorax. 2. Volume loss in the medial aspects of both lower lobes. Difficult to exclude pneumonia. 3. Tiny bilateral  pleural effusions. Electronically Signed   By: Lorin Picket M.D.   On: 07/30/2020 13:17   VAS US DUPLEX DIALYSIS ACCESS (AVF,AVG)  Result Date: 07/18/2020 DIALYSIS ACCESS Access Site: Left Upper Extremity fistula evaluation. Access Type: Brachial-cephalic AVF. History: Left Brachialcephalic fistula placed 01/18/2777. Limitations: High output fistula. Multilubular outflow vein Performing Technologist: Alvia Grove RVT  Examination Guidelines: A complete evaluation includes B-mode imaging, spectral Doppler, color Doppler, and power Doppler as needed of all accessible portions of each vessel. Unilateral testing is considered an integral part of a complete examination. Limited examinations for reoccurring indications may be performed as noted.  Findings:  +--------------------+----------+-----------------+--------+ AVF                 PSV (cm/s)Flow Vol (mL/min)Comments +--------------------+----------+-----------------+--------+ Native artery inflow   223          4028                +--------------------+----------+-----------------+--------+ AVF Anastomosis        303                              +--------------------+----------+-----------------+--------+  +------------+----------+-------------+----------+--------+ OUTFLOW VEINPSV (cm/s)Diameter (cm)Depth (cm)Describe +------------+----------+-------------+----------+--------+ Shoulder       125        1.62        0.29            +------------+----------+-------------+----------+--------+ Prox UA        199        1.81        0.19            +------------+----------+-------------+----------+--------+ Mid UA          57        3.47        0.24            +------------+----------+-------------+----------+--------+ Dist UA      54 / 104     2.89        0.17            +------------+----------+-------------+----------+--------+ AC Fossa       151        2.21        0.18            +------------+----------+-------------+----------+--------+   Summary: 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 Arteriovenous fistula-Velocities less than 100cm/s noted in the mid and distal outflow vein. *See table(s) above for measurements and observations.  Diagnosing physician: Harold Barban MD Electronically signed by Harold Barban MD on 07/18/2020 at 7:18:00 PM.    --------------------------------------------------------------------------------   Final    HYBRID OR IMAGING ([place])  Result Date: 07/30/2020 There is no interpretation for this exam.  This order is for images obtained during a surgical procedure.  Please See "Surgeries" Tab for more information regarding the procedure.     Subjective: No complaints  Discharge Exam: Vitals:    08/08/20 2123 08/09/20 0601  BP: 120/81 129/86  Pulse: 96 89  Resp: 18 17  Temp: 98.7 F (37.1 C) 98.2 F (36.8 C)  SpO2: 92% 98%   Vitals:   08/08/20 1039 08/08/20 1100 08/08/20 2123 08/09/20 0601  BP: 117/76 (!) 133/94 120/81 129/86  Pulse:  (!) 101 96 89  Resp: 17  18 17   Temp: 99.2 F (37.3 C) 99.1 F (37.3 C) 98.7 F (37.1 C) 98.2 F (36.8 C)  TempSrc: Oral Oral Oral Oral  SpO2: 100% 90% 92% 98%  Weight: 54.9 kg       General: Pt is alert, awake, not in acute distress Cardiovascular: RRR, S1/S2 +, no rubs, no gallops Respiratory: CTA bilaterally, no wheezing, no rhonchi Abdominal: Soft, NT, ND, bowel sounds + Extremities: no edema, no cyanosis    The results of significant diagnostics from this hospitalization (including imaging, microbiology, ancillary and laboratory) are listed below for reference.     Microbiology: Recent Results (from the past 240 hour(s))  Resp Panel by RT-PCR (Flu A&B, Covid) Nasopharyngeal Swab     Status: None   Collection Time: 08/06/20  6:13 AM   Specimen: Nasopharyngeal Swab; Nasopharyngeal(NP) swabs in vial transport medium  Result Value Ref Range Status   SARS Coronavirus 2 by RT PCR NEGATIVE NEGATIVE Final    Comment: (NOTE) SARS-CoV-2 target nucleic acids are NOT DETECTED.  The SARS-CoV-2 RNA is generally detectable in upper respiratory specimens during the acute phase of infection. The lowest concentration of SARS-CoV-2 viral copies this assay can detect is 138 copies/mL. A negative result does not preclude SARS-Cov-2 infection and should not be used as the sole basis for treatment or other patient management decisions. A negative result may occur with  improper specimen collection/handling, submission of specimen other than nasopharyngeal swab, presence of viral mutation(s) within the areas targeted by this assay, and inadequate number of viral copies(<138 copies/mL). A negative result must be combined with clinical  observations, patient history, and epidemiological information. The expected result is Negative.  Fact Sheet for Patients:  EntrepreneurPulse.com.au  Fact Sheet for Healthcare Providers:  IncredibleEmployment.be  This test is no t yet approved or cleared by the [place] FDA and  has been authorized for detection and/or diagnosis of SARS-CoV-2 by FDA under an Emergency Use Authorization (EUA). This EUA will remain  in effect (meaning this test can be used) for the duration of the COVID-19 declaration under Section 564(b)(1) of the Act, 21 U.S.C.section 360bbb-3(b)(1), unless the authorization is terminated  or revoked sooner.       Influenza A by PCR NEGATIVE NEGATIVE Final   Influenza B by PCR NEGATIVE NEGATIVE Final    Comment: (NOTE) The Xpert Xpress SARS-CoV-2/FLU/RSV plus assay is intended as an aid in the diagnosis of influenza from Nasopharyngeal swab specimens and should not be used as a sole basis for treatment. Nasal washings and aspirates are unacceptable for Xpert Xpress SARS-CoV-2/FLU/RSV testing.  Fact Sheet for Patients: EntrepreneurPulse.com.au  Fact Sheet for Healthcare Providers: IncredibleEmployment.be  This test is not yet approved or cleared by the [place] FDA and has been authorized for detection and/or diagnosis of SARS-CoV-2 by FDA under an Emergency Use Authorization (EUA). This EUA will remain in effect (meaning this test can be used) for the duration of the COVID-19 declaration under Section 564(b)(1) of the Act, 21 U.S.C. section 360bbb-3(b)(1), unless the authorization is terminated or revoked.  Performed at [facility], [place] [address] [place], [place] [address]   Culture, blood (Routine X 2) w Reflex to ID Panel     Status: None (Preliminary result)   Collection Time: 08/07/20 12:41 AM   Specimen: BLOOD  Result Value Ref Range Status   Specimen  Description BLOOD RIGHT ANTECUBITAL  Final   Special Requests   Final    BOTTLES DRAWN AEROBIC AND ANAEROBIC Blood Culture adequate volume   Culture   Final    NO GROWTH 2 DAYS Performed at [facility], [place] [address], [place], [place] [address]    Report  Status PENDING  Incomplete  Culture, blood (Routine X 2) w Reflex to ID Panel     Status: None (Preliminary result)   Collection Time: 08/07/20 12:44 AM   Specimen: BLOOD RIGHT WRIST  Result Value Ref Range Status   Specimen Description BLOOD RIGHT WRIST  Final   Special Requests   Final    BOTTLES DRAWN AEROBIC AND ANAEROBIC Blood Culture adequate volume   Culture   Final    NO GROWTH 2 DAYS Performed at [facility], [address] [place], [place] [address]    Report Status PENDING  Incomplete  MRSA PCR Screening     Status: None   Collection Time: 08/07/20  6:33 AM   Specimen: Nasal Mucosa; Nasopharyngeal  Result Value Ref Range Status   MRSA by PCR NEGATIVE NEGATIVE Final    Comment:        The GeneXpert MRSA Assay (FDA approved for NASAL specimens only), is one component of a comprehensive MRSA colonization surveillance program. It is not intended to diagnose MRSA infection nor to guide or monitor treatment for MRSA infections. Performed at [facility], [place] [address], [place],  [address]      Labs: BNP (last 3 results) Recent Labs    08/07/20 0021  BNP 109.3*   Basic Metabolic Panel: Recent Labs  Lab 08/06/20 0405 08/07/20 0020 08/07/20 0459  NA 132* 136 135  K 3.9 3.7 3.6  CL 93* 94* 94*  CO2 24 28 27   GLUCOSE 109* 108* 110*  BUN 52* 24* 31*  CREATININE 7.73* 4.40* 4.98*  CALCIUM 9.7 10.3 9.5   Liver Function Tests: Recent Labs  Lab 08/06/20 0405  AST 22  ALT 8  ALKPHOS 40  BILITOT 1.3*  PROT 7.1  ALBUMIN 3.5   No results for input(s): LIPASE, AMYLASE in the last 168 hours. No results for input(s): AMMONIA in the last 168 hours. CBC: Recent Labs  Lab  08/06/20 0405 08/07/20 0020 08/07/20 0459  WBC 7.3 8.8 6.5  NEUTROABS 6.0  --   --   HGB 9.5* 9.3* 8.6*  HCT 28.6* 27.8* 25.7*  MCV 100.0 98.6 100.8*  PLT 103* 121* 102*   Cardiac Enzymes: No results for input(s): CKTOTAL, CKMB, CKMBINDEX, TROPONINI in the last 168 hours. BNP: Invalid input(s): POCBNP CBG: Recent Labs  Lab 08/07/20 0107  GLUCAP 95   D-Dimer No results for input(s): DDIMER in the last 72 hours. Hgb A1c No results for input(s): HGBA1C in the last 72 hours. Lipid Profile No results for input(s): CHOL, HDL, LDLCALC, TRIG, CHOLHDL, LDLDIRECT in the last 72 hours. Thyroid function studies No results for input(s): TSH, T4TOTAL, T3FREE, THYROIDAB in the last 72 hours.  Invalid input(s): FREET3 Anemia work up No results for input(s): VITAMINB12, FOLATE, FERRITIN, TIBC, IRON, RETICCTPCT in the last 72 hours. Urinalysis    Component Value Date/Time   COLORURINE YELLOW 02/19/2019 1545   APPEARANCEUR CLOUDY (A) 02/19/2019 1545   LABSPEC 1.011 02/19/2019 1545   PHURINE 9.0 (H) 02/19/2019 1545   GLUCOSEU NEGATIVE 02/19/2019 1545   HGBUR NEGATIVE 02/19/2019 1545   [place] 02/19/2019 1545   KETONESUR NEGATIVE 02/19/2019 1545   PROTEINUR >=300 (A) 02/19/2019 1545   UROBILINOGEN 0.2 06/17/2016 1257   NITRITE NEGATIVE 02/19/2019 1545   LEUKOCYTESUR NEGATIVE 02/19/2019 1545   Sepsis Labs Invalid input(s): PROCALCITONIN,  WBC,  LACTICIDVEN Microbiology Recent Results (from the past 240 hour(s))  Resp Panel by RT-PCR (Flu A&B, Covid) Nasopharyngeal Swab  Status: None   Collection Time: 08/06/20  6:13 AM   Specimen: Nasopharyngeal Swab; Nasopharyngeal(NP) swabs in vial transport medium  Result Value Ref Range Status   SARS Coronavirus 2 by RT PCR NEGATIVE NEGATIVE Final    Comment: (NOTE) SARS-CoV-2 target nucleic acids are NOT DETECTED.  The SARS-CoV-2 RNA is generally detectable in upper respiratory specimens during the acute phase of  infection. The lowest concentration of SARS-CoV-2 viral copies this assay can detect is 138 copies/mL. A negative result does not preclude SARS-Cov-2 infection and should not be used as the sole basis for treatment or other patient management decisions. A negative result may occur with  improper specimen collection/handling, submission of specimen other than nasopharyngeal swab, presence of viral mutation(s) within the areas targeted by this assay, and inadequate number of viral copies(<138 copies/mL). A negative result must be combined with clinical observations, patient history, and epidemiological information. The expected result is Negative.  Fact Sheet for Patients:  EntrepreneurPulse.com.au  Fact Sheet for Healthcare Providers:  IncredibleEmployment.be  This test is no t yet approved or cleared by the [place] FDA and  has been authorized for detection and/or diagnosis of SARS-CoV-2 by FDA under an Emergency Use Authorization (EUA). This EUA will remain  in effect (meaning this test can be used) for the duration of the COVID-19 declaration under Section 564(b)(1) of the Act, 21 U.S.C.section 360bbb-3(b)(1), unless the authorization is terminated  or revoked sooner.       Influenza A by PCR NEGATIVE NEGATIVE Final   Influenza B by PCR NEGATIVE NEGATIVE Final    Comment: (NOTE) The Xpert Xpress SARS-CoV-2/FLU/RSV plus assay is intended as an aid in the diagnosis of influenza from Nasopharyngeal swab specimens and should not be used as a sole basis for treatment. Nasal washings and aspirates are unacceptable for Xpert Xpress SARS-CoV-2/FLU/RSV testing.  Fact Sheet for Patients: EntrepreneurPulse.com.au  Fact Sheet for Healthcare Providers: IncredibleEmployment.be  This test is not yet approved or cleared by the [place] FDA and has been authorized for detection and/or diagnosis of SARS-CoV-2  by FDA under an Emergency Use Authorization (EUA). This EUA will remain in effect (meaning this test can be used) for the duration of the COVID-19 declaration under Section 564(b)(1) of the Act, 21 U.S.C. section 360bbb-3(b)(1), unless the authorization is terminated or revoked.  Performed at [facility], [place] [address] [place], [place] [address]   Culture, blood (Routine X 2) w Reflex to ID Panel     Status: None (Preliminary result)   Collection Time: 08/07/20 12:41 AM   Specimen: BLOOD  Result Value Ref Range Status   Specimen Description BLOOD RIGHT ANTECUBITAL  Final   Special Requests   Final    BOTTLES DRAWN AEROBIC AND ANAEROBIC Blood Culture adequate volume   Culture   Final    NO GROWTH 2 DAYS Performed at [facility], [place] [address] [place], [place] [address]    Report Status PENDING  Incomplete  Culture, blood (Routine X 2) w Reflex to ID Panel     Status: None (Preliminary result)   Collection Time: 08/07/20 12:44 AM   Specimen: BLOOD RIGHT WRIST  Result Value Ref Range Status   Specimen Description BLOOD RIGHT WRIST  Final   Special Requests   Final    BOTTLES DRAWN AEROBIC AND ANAEROBIC Blood Culture adequate volume   Culture   Final    NO GROWTH 2 DAYS Performed at [facility], [place] [address],  [place], [place] [address]    Report Status PENDING  Incomplete  MRSA PCR Screening     Status: None   Collection Time: 08/07/20  6:33 AM   Specimen: Nasal Mucosa; Nasopharyngeal  Result Value Ref Range Status   MRSA by PCR NEGATIVE NEGATIVE Final    Comment:        The GeneXpert MRSA Assay (FDA approved for NASAL specimens only), is one component of a comprehensive MRSA colonization surveillance program. It is not intended to diagnose MRSA infection nor to guide or monitor treatment for MRSA infections. Performed at [facility], [place] [address] [place], [place] [address]      Time coordinating discharge: Over 30  minutes  SIGNED:   Charlynne Cousins, MD  [facility] 08/09/2020, 11:03 AM Pager   If 7PM-7AM, please contact night-coverage [email or web address] Password [email or web address]

## 2020-08-09 NOTE — Progress Notes (Signed)
[facility] Progress Note  Subjective: feeling a little better, seen on HD, nasal O2  Vitals:   08/08/20 1039 08/08/20 1100 08/08/20 2123 08/09/20 0601  BP: 117/76 (!) 133/94 120/81 129/86  Pulse:  (!) 101 96 89  Resp: 17  18 17   Temp: 99.2 F (37.3 C) 99.1 F (37.3 C) 98.7 F (37.1 C) 98.2 F (36.8 C)  TempSrc: Oral Oral Oral Oral  SpO2: 100% 90% 92% 98%  Weight: 54.9 kg       Exam:    alert, nad   no jvd  Chest cta bilat  Cor reg no RG  Abd soft ntnd no ascites   Ext no LE edema   Alert, NF, ox3   LUA AVF is wrapped after recent surg/ R IJ [facility]    Home meds:  - norvasc 10/ hydralazine 100 tid/ coreg 25 bid  - perceot qid prn/ keppra 1 gm qd  - renvela 2 ac tid  - prn's/ vitamins/ supplements  CXR 2/22 - IMPRESSION: Unchanged right lower lobe consolidation and small right pleural effusion.    [facility]: [facility]  3h 40min  350/500  56kg  2/2 bath  Hep none  RIJ TDC/ LUA AVF sp plication  - sensipar 30 tiw  - hect 2 ug tiw  - venofer 100 q hd , not started yet  - mircera 75 ug q4 wks, last 2/17     Assessment/ Plan: 1. RLL PNA - by CXR, per primary team, abx per primary team.   2. Hypoxic resp failure, acute - likely due to combination of fluid excess and pna.   3. ESRD - on HD TTS.  OK for dc. Will get HD at OP unit today and again Saturday.  4. HTN/ vol - pulm edema improved, needs further lowering in OP setting, instructions will be given to HD unit.  5. Anemia ckd - Hb 8.6, just got 2 wk esa 5 d ago. Held IV Fe w/ infection.  6. MBD ckd - cont meds     Rob Wendal Wilkie 08/09/2020, 10:19 AM   Recent Labs  Lab 08/07/20 0020 08/07/20 0459  K 3.7 3.6  BUN 24* 31*  CREATININE 4.40* 4.98*  CALCIUM 10.3 9.5  HGB 9.3* 8.6*   Inpatient medications: . amLODipine  10 mg Oral QHS  . azithromycin  500 mg Oral Q24H  . carvedilol  25 mg Oral BID WC  . Chlorhexidine Gluconate Cloth  6 each Topical Q0600  . cinacalcet  30 mg Oral Q  T,Th,Sa-HD  . docusate sodium  100 mg Oral Daily  . doxercalciferol  2 mcg Intravenous Q T,Th,Sa-HD  . heparin  5,000 Units Subcutaneous Q8H  . hydrALAZINE  100 mg Oral TID  . levETIRAcetam  1,000 mg Oral Daily  . polyethylene glycol  17 g Oral Daily  . sevelamer carbonate  1,600 mg Oral TID WC   . cefTRIAXone (ROCEPHIN)  IV 1 g (08/08/20 1432)   diphenhydrAMINE, ondansetron (ZOFRAN) IV, oxyCODONE-acetaminophen, sevelamer carbonate

## 2020-08-09 NOTE — TOC Progression Note (Addendum)
Transition of Care [facility]) - Progression Note    Patient Details  Name: Madeline Brown MRN: [id number] Date of Birth: 22-Jul-1975  Transition of Care [facility]) CM/SW Contact  Jacalyn Lefevre Edson Snowball, RN Phone Number: 08/09/2020, 8:58 AM  Clinical Narrative:    [facility] has accepted referral for HHPT. Placed on AVS  Ordered Oxygen through [place]    Expected Discharge Plan: [place]    Expected Discharge Plan and Services Expected Discharge Plan: [place]   Discharge Planning Services: CM Consult Post Acute Care Choice: [place] arrangements for the past 2 months: Apartment Expected Discharge Date: 08/09/20                 DME Agency: NA       HH Arranged: PT HH Agency: [place] Date [place]: 08/08/20 Time [place]: [id number] Representative spoke with at [place]: [place] ([place]) Interventions    Readmission Risk Interventions No flowsheet data found.

## 2020-08-12 LAB — CULTURE, BLOOD (ROUTINE X 2)
Culture: NO GROWTH
Culture: NO GROWTH
Special Requests: ADEQUATE
Special Requests: ADEQUATE

## 2020-08-14 ENCOUNTER — Other Ambulatory Visit: Payer: Self-pay

## 2020-08-14 ENCOUNTER — Ambulatory Visit (INDEPENDENT_AMBULATORY_CARE_PROVIDER_SITE_OTHER): Payer: Medicare HMO | Admitting: Physician Assistant

## 2020-08-14 ENCOUNTER — Encounter: Payer: Self-pay | Admitting: Physician Assistant

## 2020-08-14 VITALS — BP 117/84 | HR 84 | Temp 97.9°F | Resp 20 | Ht 60.0 in | Wt 121.4 lb

## 2020-08-14 DIAGNOSIS — Z992 Dependence on renal dialysis: Secondary | ICD-10-CM

## 2020-08-14 DIAGNOSIS — N186 End stage renal disease: Secondary | ICD-10-CM

## 2020-08-14 MED ORDER — OXYCODONE-ACETAMINOPHEN 5-325 MG PO TABS: 1.0000 | ORAL_TABLET | Freq: Four times a day (QID) | ORAL | 0 refills | Status: DC | PRN

## 2020-08-14 MED ORDER — OXYCODONE-ACETAMINOPHEN 5-325 MG PO TABS
1.0000 | ORAL_TABLET | ORAL | 0 refills | Status: DC | PRN
Start: 2020-08-14 — End: 2021-02-01

## 2020-08-14 NOTE — Progress Notes (Signed)
  POST OPERATIVE OFFICE NOTE    CC:  F/u for surgery   HPI:  This is a 45 y.o. female who is s/p extensive revision of left AV fistula and placement of [facility] 07/30/20 by Dr. Stanford Breed.  She is here for exam and possible staple removal.  She was last seen in the ED on 08/06/20.  At that time she had significant edema and was complaining of pain.  He pain is improving slowly.  She has numbness and tingling mainly in the ulnar nerve distribution.  Edema is improving.     Allergies  Allergen Reactions  . Visine [Tetrahydrozoline Hcl] Swelling    Eyes Swelling    Current Outpatient Medications  Medication Sig Dispense Refill  . amLODipine (NORVASC) 10 MG tablet Take 1 tablet (10 mg total) by mouth at bedtime. 30 tablet 0  . azithromycin (ZITHROMAX) 250 MG tablet Take one tab daily 3 each 0  . B Complex-C-Folic Acid (DIALYVITE TABLET) TABS Take 1 tablet by mouth daily.    . carvedilol (COREG) 25 MG tablet Take 1 tablet (25 mg total) by mouth 2 (two) times daily with a meal. 60 tablet 0  . diphenhydrAMINE (BENADRYL) 25 MG tablet Take 25 mg by mouth every 6 (six) hours as needed for itching or sleep.    . hydrALAZINE (APRESOLINE) 100 MG tablet Take 1 tablet (100 mg total) by mouth 3 (three) times daily. 90 tablet 0  . levETIRAcetam (KEPPRA) 1000 MG tablet Take 1,000 mg by mouth daily.    Marland Kitchen oxyCODONE-acetaminophen (PERCOCET/ROXICET) 5-325 MG tablet Take 1 tablet by mouth every 4 (four) hours as needed for moderate pain. 30 tablet 0  . sevelamer carbonate (RENVELA) 800 MG tablet Take 2 tablets (1,600 mg total) by mouth 3 (three) times daily with meals. (Patient taking differently: Take 1,600 mg by mouth See admin instructions. Take 2 tablets (1600 mg) by mouth 3 times daily with meals & take 1 tablet (800 mg) by mouth with snacks) 30 tablet 0  . spironolactone (ALDACTONE) 50 MG tablet Take 1 tablet (50 mg total) by mouth daily. 30 tablet 0   Current Facility-Administered Medications  Medication Dose  Route Frequency Provider Last Rate Last Admin  . sodium chloride flush (NS) 0.9 % injection 3 mL  3 mL Intravenous Q12H Cherre Robins, MD      . sodium chloride flush (NS) 0.9 % injection 3 mL  3 mL Intravenous PRN Cherre Robins, MD         ROS:  See HPI  Physical Exam:      Incision:  Healing well, no drainage or openings.  The black eschar is from the edema.  She has a palpable radial pulse and palpable thrill in the fistula.  The surrounding tissue is still firm with edema surrounding the incision.  Her forearm and hand have no evidence of edema.   Grip on the left is 4+/5, fingers warm and well perfused.    Assessment/Plan:  This is a 45 y.o. 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  I gave her an exercise ball to squeeze.  She will use the left UE for everyday activity as tolerates and elevation.  We took out every other staple.  She will return in 1-2 weeks for the remainder of the staples to be removed and for exam.       Roxy Horseman PA-C [facility] [phone]   Clinic MD:  Stanford Breed

## 2020-08-15 ENCOUNTER — Encounter: Payer: Medicare HMO | Admitting: Obstetrics and Gynecology

## 2020-08-27 ENCOUNTER — Other Ambulatory Visit: Payer: Self-pay

## 2020-08-27 ENCOUNTER — Ambulatory Visit (INDEPENDENT_AMBULATORY_CARE_PROVIDER_SITE_OTHER): Payer: Medicare HMO | Admitting: Physician Assistant

## 2020-08-27 VITALS — BP 134/91 | HR 63 | Temp 98.2°F | Resp 20 | Ht 60.0 in | Wt 121.4 lb

## 2020-08-27 DIAGNOSIS — N186 End stage renal disease: Secondary | ICD-10-CM

## 2020-08-27 DIAGNOSIS — Z992 Dependence on renal dialysis: Secondary | ICD-10-CM

## 2020-08-27 NOTE — Progress Notes (Signed)
POST OPERATIVE OFFICE NOTE    CC:  F/u for surgery  HPI:  This is a 45 y.o. female who is s/p left UE plication revision of aneurysmal fistula on 07/30/20 by Dr. Stanford Breed.  She had significant edema with mild hematoma and ischemic superficial skin changes.  She also reported decreased sensation in the left ulnar nerve distribution.  Pt returns today for follow up.  Pt states Her sensation has increased in her ring finger and improving in the pinky on the left hand.  Over all she is slowly improving.  Allergies  Allergen Reactions  . Visine [Tetrahydrozoline Hcl] Swelling    Eyes Swelling    Current Outpatient Medications  Medication Sig Dispense Refill  . amoxicillin-clavulanate (AUGMENTIN) 500-125 MG tablet Take by mouth.    Marland Kitchen azithromycin (ZITHROMAX) 250 MG tablet Take one tab daily 3 each 0  . B Complex-C-Folic Acid (DIALYVITE TABLET) TABS Take 1 tablet by mouth daily.    . diphenhydrAMINE (BENADRYL) 25 MG tablet Take 25 mg by mouth every 6 (six) hours as needed for itching or sleep.    Marland Kitchen doxercalciferol (HECTOROL) 0.5 MCG capsule Doxercalciferol (Hectorol)    . iron sucrose in sodium chloride 0.9 % 100 mL Iron Sucrose (Venofer)    . levETIRAcetam (KEPPRA) 1000 MG tablet Take 1,000 mg by mouth daily.    . Methoxy PEG-Epoetin Beta (MIRCERA IJ) Mircera    . oxyCODONE-acetaminophen (PERCOCET/ROXICET) 5-325 MG tablet Take 1 tablet by mouth every 6 (six) hours as needed. 30 tablet 0  . oxyCODONE-acetaminophen (PERCOCET/ROXICET) 5-325 MG tablet Take 1 tablet by mouth every 4 (four) hours as needed for moderate pain. 20 tablet 0  . sevelamer carbonate (RENVELA) 800 MG tablet Take 2 tablets (1,600 mg total) by mouth 3 (three) times daily with meals. (Patient taking differently: Take 1,600 mg by mouth See admin instructions. Take 2 tablets (1600 mg) by mouth 3 times daily with meals & take 1 tablet (800 mg) by mouth with snacks) 30 tablet 0  . spironolactone (ALDACTONE) 50 MG tablet Take 1  tablet (50 mg total) by mouth daily. 30 tablet 0  . amLODipine (NORVASC) 10 MG tablet Take 1 tablet (10 mg total) by mouth at bedtime. 30 tablet 0  . carvedilol (COREG) 25 MG tablet Take 1 tablet (25 mg total) by mouth 2 (two) times daily with a meal. 60 tablet 0  . hydrALAZINE (APRESOLINE) 100 MG tablet Take 1 tablet (100 mg total) by mouth 3 (three) times daily. 90 tablet 0   Current Facility-Administered Medications  Medication Dose Route Frequency Provider Last Rate Last Admin  . sodium chloride flush (NS) 0.9 % injection 3 mL  3 mL Intravenous Q12H Cherre Robins, MD      . sodium chloride flush (NS) 0.9 % injection 3 mL  3 mL Intravenous PRN Cherre Robins, MD         ROS:  See HPI  Physical Exam:      Incision:  Well healing incision with firm hematoma along the entire incision Extremities:  Grip 5/5, ulnar nerve decreased sensation, palpable radial pulse and palpable thrill in fistula. Staples removed patient tolerated this well.   Assessment/Plan:  This is a 45 y.o. female who is s/p:  left UE plication revision of aneurysmal fistula on 07/30/20 by Dr. Stanford Breed. Activity as tolerates using the left UE.  I will see her back in 1 month for incision check.  She will continue to use the right [facility] until her left  arm has healed and the hematoma has softened and decreased edema.  Do not stick fistula.    Roxy Horseman PA-C [facility] [phone]   Clinic MD:  Trula Slade

## 2020-09-17 DIAGNOSIS — T829XXA Unspecified complication of cardiac and vascular prosthetic device, implant and graft, initial encounter: Secondary | ICD-10-CM | POA: Insufficient documentation

## 2020-09-22 ENCOUNTER — Other Ambulatory Visit: Payer: Self-pay | Admitting: Adult Health

## 2020-09-24 ENCOUNTER — Ambulatory Visit: Payer: Medicare HMO

## 2020-09-26 NOTE — Telephone Encounter (Signed)
I called pt and she stated that she has been taking keppra 1000mg  po daily not twice daily.  She stated she did mention to her pcp, and her pcp told her that twice daily was what was ordered, but if she is doing ok no seizures then taking once daily would be ok.  Has enough to take until 10-05-20 but needs appt for refills.  Pt could not do right now, but would call back after 1500 when offi work.  I told her that can refill up to appt scheduled.  She verbalized understanding.

## 2020-10-02 ENCOUNTER — Telehealth: Payer: Self-pay | Admitting: Adult Health

## 2020-10-02 NOTE — Telephone Encounter (Signed)
Pt called, have schedule an appt so I can get my medicaiton. Scheduled appt 10/29/20. Would like a call to let me know when prescription for Keppra has been sent.

## 2020-10-02 NOTE — Telephone Encounter (Signed)
Called pt and relayed that keppra prescription is called in 09-27-20 at 1620 for #60.  Her appt is 10-29-20 she should have enough then to get to appt.  Appreciated call back.

## 2020-10-04 ENCOUNTER — Ambulatory Visit: Payer: Medicare HMO

## 2020-10-05 ENCOUNTER — Encounter: Payer: Self-pay | Admitting: Vascular Surgery

## 2020-10-15 ENCOUNTER — Other Ambulatory Visit: Payer: Self-pay

## 2020-10-15 ENCOUNTER — Encounter: Payer: Self-pay | Admitting: Family Medicine

## 2020-10-15 ENCOUNTER — Ambulatory Visit (INDEPENDENT_AMBULATORY_CARE_PROVIDER_SITE_OTHER): Payer: Medicare HMO | Admitting: Physician Assistant

## 2020-10-15 VITALS — BP 145/96 | HR 80 | Temp 98.2°F | Resp 20 | Ht 60.0 in | Wt 131.0 lb

## 2020-10-15 DIAGNOSIS — Z992 Dependence on renal dialysis: Secondary | ICD-10-CM

## 2020-10-15 DIAGNOSIS — N186 End stage renal disease: Secondary | ICD-10-CM

## 2020-10-15 NOTE — Progress Notes (Signed)
POST OPERATIVE DIALYSIS ACCESS OFFICE NOTE    CC:  F/u for dialysis access surgery  HPI:  This is a 45 y.o. female who is s/p  left UE plication revision of aneurysmal fistula on 07/30/20 by Dr. Stanford Breed.  She returns today for incision check.  He states she has some residual numbness of her fourth and fifth digits previously.  She states she now only has about 40% residual numbness of the fifth digit.  She is dialyzing via right IJ tunneled dialysis catheter without complications   Dialysis days: Tuesday, Thursday, Saturday Dialysis center: [facility]  Allergies  Allergen Reactions  . Visine [Tetrahydrozoline Hcl] Swelling    Eyes Swelling    Current Outpatient Medications  Medication Sig Dispense Refill  . acetaminophen (OFIRMEV) 10 MG/ML SOLN Take by mouth.    . iron sucrose in sodium chloride 0.9 % 100 mL Iron Sucrose (Venofer)    . amLODipine (NORVASC) 10 MG tablet Take 1 tablet (10 mg total) by mouth at bedtime. 30 tablet 0  . amoxicillin-clavulanate (AUGMENTIN) 500-125 MG tablet Take by mouth.    Marland Kitchen azithromycin (ZITHROMAX) 250 MG tablet Take one tab daily 3 each 0  . B Complex-C-Folic Acid (DIALYVITE TABLET) TABS Take 1 tablet by mouth daily.    . carvedilol (COREG) 25 MG tablet Take 1 tablet (25 mg total) by mouth 2 (two) times daily with a meal. 60 tablet 0  . diphenhydrAMINE (BENADRYL) 25 MG tablet Take 25 mg by mouth every 6 (six) hours as needed for itching or sleep.    Marland Kitchen doxercalciferol (HECTOROL) 0.5 MCG capsule Doxercalciferol (Hectorol)    . hydrALAZINE (APRESOLINE) 100 MG tablet Take 1 tablet (100 mg total) by mouth 3 (three) times daily. 90 tablet 0  . levETIRAcetam (KEPPRA) 1000 MG tablet Take 1 tablet (1,000 mg total) by mouth 2 (two) times daily. 60 tablet 0  . Methoxy PEG-Epoetin Beta (MIRCERA IJ) Mircera    . oxyCODONE-acetaminophen (PERCOCET/ROXICET) 5-325 MG tablet Take 1 tablet by mouth every 6 (six) hours as needed. 30 tablet 0  .  oxyCODONE-acetaminophen (PERCOCET/ROXICET) 5-325 MG tablet Take 1 tablet by mouth every 4 (four) hours as needed for moderate pain. 20 tablet 0  . oxyCODONE-acetaminophen (PERCOCET/ROXICET) 5-325 MG tablet TAKE 1 TABLET BY MOUTH EVERY FOUR HOURS AS NEEDED FOR MODERATE PAIN. 30 tablet 0  . sevelamer carbonate (RENVELA) 800 MG tablet Take 2 tablets (1,600 mg total) by mouth 3 (three) times daily with meals. (Patient taking differently: Take 1,600 mg by mouth See admin instructions. Take 2 tablets (1600 mg) by mouth 3 times daily with meals & take 1 tablet (800 mg) by mouth with snacks) 30 tablet 0  . spironolactone (ALDACTONE) 50 MG tablet Take 1 tablet (50 mg total) by mouth daily. 30 tablet 0   Current Facility-Administered Medications  Medication Dose Route Frequency Provider Last Rate Last Admin  . sodium chloride flush (NS) 0.9 % injection 3 mL  3 mL Intravenous Q12H Cherre Robins, MD      . sodium chloride flush (NS) 0.9 % injection 3 mL  3 mL Intravenous PRN Cherre Robins, MD         ROS:  See HPI  BP (!) 145/96 (BP Location: Right Arm, Patient Position: Sitting, Cuff Size: Normal)   Pulse 80   Temp 98.2 F (36.8 C) (Temporal)   Resp 20   Ht 5' (1.524 m)   Wt 131 lb (59.4 kg)   LMP 01/06/2018  SpO2 96%   BMI 25.58 kg/m    Physical Exam:  General appearance: Well developed, well-nourished in no apparent distress Cardiac: Rate and rhythm are regular Respiratory: Nonlabored Incision: Left upper arm incision well-healed. Extremities: Left upper extremity: Good thrill and bruit in fistula.  5 out of 5 hand grip strength.  2+ radial pulse.  Motor function and sensation intact.   Assessment/Plan:   Status post left upper extremity AV fistula plication.  Incision well-healed.  Hematoma resolved.  May resume using fistula.  Arrangements will be made for discontinuing her tunneled dialysis catheter once nephrology team is satisfied with ongoing dialysis treatment.  Barbie Banner, PA-C 10/15/2020 9:14 AM [facility] [phone]  Clinic MD: Trula Slade

## 2020-10-29 ENCOUNTER — Encounter: Payer: Self-pay | Admitting: Adult Health

## 2020-10-29 ENCOUNTER — Telehealth: Payer: Self-pay | Admitting: Adult Health

## 2020-10-29 ENCOUNTER — Ambulatory Visit: Payer: Medicare HMO | Admitting: Adult Health

## 2020-10-29 NOTE — Telephone Encounter (Signed)
Called patient she states she did call was on hold for 25 minutes and left a message for voicemail.  Stating that she would not be here due to sinus symptoms.  I rescheduled her for June 25 at 1015 I did relate that this was her second no-show if she did not keep this appointment then she be discharged..  She verbalized understanding.  She has enough sz meds until 12-04-20, she will call as gets closer.

## 2020-10-29 NOTE — Progress Notes (Deleted)
[facility] [address] [place]. [place] [address] [phone]       STROKE FOLLOW UP NOTE  [name] Date of Birth:  [birth year] Medical Record Number:  [id number]   Reason for Referral: Left ICH follow up    CHIEF COMPLAINT:  No chief complaint on file.   HPI:   Today, 10/29/2020, Madeline Brown returns for stroke and seizure follow-up after prior visit over 1 year ago.         History provided for reference purposes only Update 10/10/2019 JM: Madeline Brown returns for follow-up regarding left ICH with subsequent seizure activity in 02/2019 after multiple prior no-show visits.  She has been stable since prior visit without residual stroke deficits or new/reoccurring stroke/TIA symptoms.  Recommended repeating MRI for possible underlying ICH etiologies with order placed but not completed at this time. Continues on Keppra 1000 mg daily tolerating well with questionable breakthrough seizure 1 week ago with transient speech difficulty lasting approximately 5 to 10 minutes and then resolved after sitting with eyes closed taking deep breaths.  She does report increased stressful event prior and symptoms similar to prior seizure-like activity.  No prior breakthrough seizure.  Continues on atorvastatin for secondary stroke prevention.  Blood pressure today 152/102. She does not routinely monitor at home but again, plans on obtaining cuff to monitor at home. Monitors at HD and range 140-160/103-110 prior to HD and improve after HD.  Continues to receive HD and currently in the process of finding a living donor for kidney transplant.  No concerns at this time.  Initial visit 03/23/2019 JM: Madeline Brown is being seen today for hospital follow-up.  She has been stable since discharge without reoccurring or new stroke/TIA symptoms.  She has continued on Keppra 1000 mg daily without recurrent seizure activity.  She is questioning need of continuation of Keppra at this time.   She has continued on atorvastatin without side effects or myalgias.  She is questioning need of continuation statin therapy.  Blood pressure today elevated at 162/98, asymptomatic.  She has not taken her a.m. antihypertensives as she will usually take them around 10 AM (currently 8 AM) due to dialysis.  She does not currently monitor at home but is planning on obtaining cuff this Friday to monitor at home.  She does have follow-up with PCP on 04/02/2019.  Of note, she does report her mother passed away from a ruptured aneurysm and also had history of seizures.  No further concerns at this time.   Stroke admission 02/20/2019: Madeline Brown is a 45 y.o. female with history of ESRD s/p HD who presented to [facility] on 02/20/2019 after having 1 min episode of whole body shaking and frothing at the mouth once home after HD. She has no hx seizure. She was brought to the ED. EEG showed left parietal occipital sharp waves and left posterior quadrant activity and initiated Keppra 1000 mg daily.  CT head showed 2.1 m parietal mass with medium density rim and vasogenic edema.  MRI showed 20 x 13 mm left parietal mass with subacute hemorrhage and mild vasogenic edema estimated 63 to 36 weeks old likely secondary to uncontrolled HTN.  MRI showed torturous right ICA without vascular lesion and left VA stenosis/occlusion.  Carotid Dopplers unremarkable.  2D echo previously performed 10/2018 with normal EF and RA & LA dilated.  LDL 138.  A1c 4.3.  UDS positive for THC.  No antithrombotic therapy recommended due to hemorrhage.  Recommended repeat MRI w/wo  outpatient to assess for underlying source of hemorrhage.  HTN stable and continued amlodipine, Coreg and spironolactone. Initiated atorvastatin 20 mg daily for HLD management.  No prior history or evidence of DM.  Current tobacco user smoking cessation counseling provided.  Other stroke risk factors include EtOH use and THC use.  Discharged home with recommendations of home health  PT.  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 No hemorrhage.      MRI  20x13 mm L parietal mass w/ subacute hemorrhage and mild vasogenic edema.   MRA  Tortuous R ICA. No vascular lesion. L VA stenosis/occlusion   Carotid Doppler  B ICA 1-39% stenosis, VAs antegrade   2D Echo 10/2018 EF >65%. RA & LA dilated.  LDL 138 -initiated atorvastatin  HgbA1c 4.3  UDS positive for THC  SCDs for VTE prophylaxis  No antithrombotic prior to admission, now on No antithrombotic given hemorrhage. Follow up at [facility] to consider antiplatelet at that time  Therapy recommendations:  HH PT  Disposition: home  Repeat MRI with and without contrast in 1 month to look for underlying source of hemorrhage at outpt follow up  Seziure   No hx sz  Witnessed seizure at home post HD  EEG 1)left parieto-occiptalsharp waves 2)left posterior quadrant slow activity  On Keppra 1000 daily  Continue keppra on d/c  Follow up with [facility]   No driving unless 6 months free of seizure     ROS:   14 system review of systems performed and negative with exception of no concerns  PMH:  Past Medical History:  Diagnosis Date  . Anemia   . Chronic kidney disease   . Headache, unspecified 09/26/2017  . Hypertension   . Shortness of breath 07/15/2018    PSH:  Past Surgical History:  Procedure Laterality Date  . AV FISTULA PLACEMENT Left 06/24/2017   Procedure: CREATION OF LEFT ARM BRACHIALCEPHALIC  ARTERIOVENOUS (AV) FISTULA;  Surgeon: Rosetta Posner, MD;  Location: [facility];  Service: Vascular;  Laterality: Left;  . CESAREAN SECTION     x2  . COMPLEX WOUND CLOSURE Left 07/30/2020   Procedure: COMPLEX WOUND CLOSURE;  Surgeon: Cherre Robins, MD;  Location: [facility];  Service: Vascular;  Laterality: Left;  . FISTULA SUPERFICIALIZATION Left 10/28/2017   Procedure: SUPERFICIALIZATION LEFT  BRACHIOCEPHALIC ARTERIOVENOUS FISTULA;  Surgeon: Rosetta Posner, MD;  Location: [facility];  Service: Vascular;  Laterality: Left;  . INSERTION OF DIALYSIS CATHETER Right 06/24/2017   Procedure: INSERTION OF TUNNELED DIALYSIS CATHETER;  Surgeon: Rosetta Posner, MD;  Location: [facility];  Service: Vascular;  Laterality: Right;  . INSERTION OF DIALYSIS CATHETER Right 07/30/2020   Procedure: INSERTION OF DIALYSIS CATHETER;  Surgeon: Cherre Robins, MD;  Location: [facility];  Service: Vascular;  Laterality: Right;  . LIGATION OF COMPETING BRANCHES OF ARTERIOVENOUS FISTULA  10/28/2017   Procedure: LIGATION OF COMPETING BRANCHES OF ARTERIOVENOUS FISTULA x3;  Surgeon: Rosetta Posner, MD;  Location: [facility];  Service: Vascular;;  . REVISON OF ARTERIOVENOUS FISTULA Left 07/30/2020   Procedure: LEFT UPPER EXTREMITY ARTERIOVENOUS FISTULA REVISON;  Surgeon: Cherre Robins, MD;  [facility];  Service: Vascular;  Laterality: Left;  PERIPHERAL NERVE BLOCK    Social History:  Social History   Socioeconomic History  . Marital status: Single    Spouse name: Not on file  . Number  of children: Not on file  . Years of education: Not on file  . Highest education level: Not on file  Occupational History  . Not on file  Tobacco Use  . Smoking status: Current Every Day Smoker    Packs/day: 0.25    Years: 15.00    Pack years: 3.75    Types: Cigarettes  . Smokeless tobacco: Never Used  Vaping Use  . Vaping Use: Never used  Substance and Sexual Activity  . Alcohol use: Yes    Comment: occassional beer  . Drug use: Yes    Types: Marijuana  . Sexual activity: Yes    Birth control/protection: None  Other Topics Concern  . Not on file  Social History Narrative  . Not on file   Social Determinants of Health   Financial Resource Strain: Not on file  Food Insecurity: Not on file  Transportation Needs: Not on file  Physical Activity: Not on file  Stress: Not on file  Social Connections: Not on file  Intimate Partner  Violence: Not on file    Family History:  Family History  Problem Relation Age of Onset  . Diabetes Father   . Hypertension Father   . Cancer Father   . Heart disease Father   . Aneurysm Mother   . Seizures Mother     Medications:   Current Outpatient Medications on File Prior to Visit  Medication Sig Dispense Refill  . acetaminophen (OFIRMEV) 10 MG/ML SOLN Take by mouth.    Marland Kitchen amLODipine (NORVASC) 10 MG tablet Take 1 tablet (10 mg total) by mouth at bedtime. 30 tablet 0  . amoxicillin-clavulanate (AUGMENTIN) 500-125 MG tablet Take by mouth.    Marland Kitchen azithromycin (ZITHROMAX) 250 MG tablet Take one tab daily 3 each 0  . B Complex-C-Folic Acid (DIALYVITE TABLET) TABS Take 1 tablet by mouth daily.    . carvedilol (COREG) 25 MG tablet Take 1 tablet (25 mg total) by mouth 2 (two) times daily with a meal. 60 tablet 0  . diphenhydrAMINE (BENADRYL) 25 MG tablet Take 25 mg by mouth every 6 (six) hours as needed for itching or sleep.    Marland Kitchen doxercalciferol (HECTOROL) 0.5 MCG capsule Doxercalciferol (Hectorol)    . hydrALAZINE (APRESOLINE) 100 MG tablet Take 1 tablet (100 mg total) by mouth 3 (three) times daily. 90 tablet 0  . iron sucrose in sodium chloride 0.9 % 100 mL Iron Sucrose (Venofer)    . levETIRAcetam (KEPPRA) 1000 MG tablet Take 1 tablet (1,000 mg total) by mouth 2 (two) times daily. 60 tablet 0  . Methoxy PEG-Epoetin Beta (MIRCERA IJ) Mircera    . oxyCODONE-acetaminophen (PERCOCET/ROXICET) 5-325 MG tablet Take 1 tablet by mouth every 6 (six) hours as needed. 30 tablet 0  . oxyCODONE-acetaminophen (PERCOCET/ROXICET) 5-325 MG tablet Take 1 tablet by mouth every 4 (four) hours as needed for moderate pain. 20 tablet 0  . oxyCODONE-acetaminophen (PERCOCET/ROXICET) 5-325 MG tablet TAKE 1 TABLET BY MOUTH EVERY FOUR HOURS AS NEEDED FOR MODERATE PAIN. 30 tablet 0  . sevelamer carbonate (RENVELA) 800 MG tablet Take 2 tablets (1,600 mg total) by mouth 3 (three) times daily with meals. (Patient  taking differently: Take 1,600 mg by mouth See admin instructions. Take 2 tablets (1600 mg) by mouth 3 times daily with meals & take 1 tablet (800 mg) by mouth with snacks) 30 tablet 0  . spironolactone (ALDACTONE) 50 MG tablet Take 1 tablet (50 mg total) by mouth daily. 30 tablet 0   Current Facility-Administered  Medications on File Prior to Visit  Medication Dose Route Frequency Provider Last Rate Last Admin  . sodium chloride flush (NS) 0.9 % injection 3 mL  3 mL Intravenous Q12H Cherre Robins, MD      . sodium chloride flush (NS) 0.9 % injection 3 mL  3 mL Intravenous PRN Cherre Robins, MD        Allergies:   Allergies  Allergen Reactions  . Visine [Tetrahydrozoline Hcl] Swelling    Eyes Swelling     Physical Exam  There were no vitals filed for this visit. There is no height or weight on file to calculate BMI. No exam data present   General: well developed, well nourished, very pleasant middle-aged African-American female, seated, in no evident distress Head: head normocephalic and atraumatic.   Neck: supple with no carotid or supraclavicular bruits; Cardiovascular: regular rate and rhythm, no murmurs; L arm fistula +bruit, +thrill Musculoskeletal: no deformity Skin:  no rash/petichiae Vascular:  Normal pulses all extremities   Neurologic Exam Mental Status: Awake and fully alert.  Fluent speech and language. Oriented to place and time. Recent and remote memory intact. Attention span, concentration and fund of knowledge appropriate. Mood and affect appropriate.  Cranial Nerves: Pupils equal, briskly reactive to light. Extraocular movements full without nystagmus. Visual fields full to confrontation. Hearing intact. Facial sensation intact. Face, tongue, palate moves normally and symmetrically.  Motor: Normal bulk and tone. Normal strength in all tested extremity muscles. Sensory.: intact to touch , pinprick , position and vibratory sensation.  Coordination: Rapid  alternating movements normal in all extremities. Finger-to-nose and heel-to-shin performed accurately bilaterally. Gait and Station: Arises from chair without difficulty. Stance is normal. Gait demonstrates normal stride length and balance Reflexes: 1+ and symmetric. Toes downgoing.          ASSESSMENT: Madeline Brown is a 45 y.o. 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 Vascular risk factors include HTN, HLD, tobacco use, EtOH use, THC use and ESRD on HD.  Has been stable from a neurological standpoint without residual deficits or reoccurring/new stroke/TIA symptoms.  Questionable breakthrough seizure 1 week prior after stressful event otherwise no additional seizure activity and ongoing compliance of Keppra 1000 mg daily.    PLAN:  1. ICH likely HTN etiology: Repeat MRI wo contrast due to end-stage renal function to assess for potential underlying etiology as recommended at hospital discharge.  Order previously placed but not completed for unknown reasons.  Avoid aspirin, aspirin-containing products and ibuprofen products due to [place].  No indication for antithrombotic as no prior history of stroke, CAD or stents.  Continue Lipitor 20 mg daily for secondary stroke prevention.  Advised refills will need to be provided by PCP.  Maintain strict control of hypertension with blood pressure goal below 130/90, diabetes with hemoglobin A1c goal below 6.5% and cholesterol with LDL cholesterol (bad cholesterol) goal below 70 mg/dL.  I also advised the patient to eat a healthy diet with plenty of whole grains, cereals, fruits and vegetables, exercise regularly with at least 30 minutes of continuous activity daily and maintain ideal body weight. 2. Seizure, post ICH: Continuation of Keppra 24hr tab 1000 mg daily - refill provided.  Discussion regarding avoidance of  seizure provoking triggers or activities and is aware to call office with any question of seizure activity or symptoms.  3. HTN: Baseline level per patient.  Highly  encourage monitoring at home to ensure satisfactory levels and ongoing follow-up with PCP for monitoring management 4. HLD: Continuation of atorvastatin and ongoing prescribing, monitoring and management through PCP    Follow up in 6 months or call earlier if needed   I spent 26 minutes of face-to-face and non-face-to-face time with patient.  This included previsit chart review, lab review, study review, order entry, electronic health record documentation, patient education     Frann Rider, [facility]  [facility] [address] [place], [place] [address]  Phone [phone] Fax [phone] Note: This document was prepared with digital dictation and possible smart phrase technology. Any transcriptional errors that result from this process are unintentional.

## 2020-10-29 NOTE — Telephone Encounter (Signed)
Patient no showed for todays scheduled appointment which was required for additional medication refills. This is the 2nd no show visit since her prior visit 1 year ago and had multiple no-shows in the past. As she is on seizure medication, I will allow 1 additional rescheduled visit but if she no shows again, she will have to be discharged from our office. Thank you

## 2020-10-30 ENCOUNTER — Encounter: Payer: Medicare HMO | Admitting: Family Medicine

## 2020-11-07 ENCOUNTER — Encounter: Payer: Medicare HMO | Admitting: Certified Nurse Midwife

## 2020-11-15 ENCOUNTER — Telehealth: Payer: Self-pay

## 2020-11-15 NOTE — Telephone Encounter (Signed)
Spoke with patient and made aware that received referral for catheter removal. Sending referral over to surgery scheduler ,Herma Ard, to schedule.

## 2020-11-20 ENCOUNTER — Other Ambulatory Visit: Payer: Self-pay

## 2020-11-20 ENCOUNTER — Telehealth: Payer: Self-pay

## 2020-11-20 NOTE — Telephone Encounter (Signed)
Order received from nephrology/Dr. Edrick Oh to have catheter removed due to pt currently has a functional fistula. TDC removal scheduled for 11/28/20 at 1100 at [facility]. Pt informed and verbalized understanding.

## 2020-11-28 ENCOUNTER — Other Ambulatory Visit: Payer: Self-pay

## 2020-11-28 ENCOUNTER — Ambulatory Visit (HOSPITAL_COMMUNITY)
Admission: RE | Admit: 2020-11-28 | Discharge: 2020-11-28 | Disposition: A | Discharge status: Home / Self Care | Payer: Medicare HMO | Source: Ambulatory Visit | Attending: Surgery | Admitting: Surgery

## 2020-11-28 MED ORDER — LIDOCAINE HCL (PF) 2 % IJ SOLN
INTRAMUSCULAR | Status: AC
  Filled 2020-11-28: qty 10

## 2020-11-28 NOTE — Progress Notes (Signed)
  Procedure Note   PRE-OPERATIVE DIAGNOSIS:   ESRD.   POST-OPERATIVE DIAGNOSIS:  ESRD.  PROCEDURE:  Removal of Right IJ perm cath.   PROVIDER:   Dagoberto Ligas PA-C.  ANESTHESIA:  Local with 1% lidocaine.  The risks and benefits of this procedure were explained to the patient and the patient consented.   OPERATIVE PROCEDURE:  The following procedure was performed at the bedside.  The right side of patient's neck and chest was prepped and draped in standard fashion.  Local anesthesia was infiltrated over the tunneled catheter and its cuff.  The cuff was loosened using a combination of blunt and sharp dissection.  The catheter was removed in its entirety, and hemostasis was achieved with local compression.    The patient tolerated the procedure well and did not have any complications.  RECOMMENDATIONS:   Dressing can be removed in 24 hours.  Patient was instructed to keep the catheter exit site clean and dry to ensure proper healing.    Dagoberto Ligas, PA-C [facility] [phone] 11/28/2020  11:59 AM

## 2020-11-28 NOTE — Progress Notes (Signed)
Right upper chest dressing CDI 30 post cath removal.  Pt ready for discharge @ 1220

## 2020-12-05 ENCOUNTER — Telehealth: Payer: Self-pay | Admitting: Adult Health

## 2020-12-05 NOTE — Telephone Encounter (Signed)
Pt. has scheduled for 6/26 but only has today's seizure pill left . Can you call in a refill? Pharmacy [facility] in [place].

## 2020-12-07 ENCOUNTER — Telehealth: Payer: Self-pay | Admitting: *Deleted

## 2020-12-07 ENCOUNTER — Telehealth: Payer: Self-pay | Admitting: Adult Health

## 2020-12-07 MED ORDER — LEVETIRACETAM 1000 MG PO TABS: 1000.0000 mg | ORAL_TABLET | Freq: Two times a day (BID) | ORAL | 1 refills | Status: DC

## 2020-12-07 NOTE — Telephone Encounter (Signed)
Pt made a request for a refill on her levETIRAcetam (KEPPRA) 1000 MG tablet on 6-22 and never received a response to her request.  Pt took her last pill this morning and is asking of on call Dr will please call in her levETIRAcetam (KEPPRA) 1000 MG tablet to [place]

## 2020-12-07 NOTE — Addendum Note (Signed)
Addended by: Andrey Spearman R on: 12/07/2020 11:38 AM   Modules accepted: Orders

## 2020-12-07 NOTE — Telephone Encounter (Signed)
   [facility] Pre-operative Risk Assessment    Patient Name: Madeline Brown  DOB: 1976-03-28  MRN: [id number]   HEARTCARE STAFF: - Please ensure there is not already an duplicate clearance open for this procedure. - Under Visit Info/Reason for Call, type in Other and utilize the format Clearance MM/DD/YY or Clearance TBD. Do not use dashes or single digits. - If request is for dental extraction, please clarify the # of teeth to be extracted. - If the patient is currently at the dentist's office, call Pre-Op APP to address. If the patient is not currently in the dentist office, please route to the Pre-Op pool  Request for surgical clearance:  What type of surgery is being performed? LAPAROSCOPIC B/L INGUINAL HERNIA REPAIR W/MESH   When is this surgery scheduled? TBD   What type of clearance is required (medical clearance vs. Pharmacy clearance to hold med vs. Both)? MEDICAL  Are there any medications that need to be held prior to surgery and how long? NONE LISTED   Practice name and name of physician performing surgery? [facility]; DR. Gurney Maxin   What is the office phone number? [phone]   7.   What is the office fax number? [place]  8.   Anesthesia type (None, local, MAC, general) ? GENERAL   Julaine Hua 12/07/2020, 4:29 PM  _________________________________________________________________   (provider comments below)

## 2020-12-07 NOTE — Telephone Encounter (Signed)
Meds ordered this encounter  Medications   levETIRAcetam (KEPPRA) 1000 MG tablet    Sig: Take 1 tablet (1,000 mg total) by mouth 2 (two) times daily.    Dispense:  60 tablet    Refill:  1    To call [phone] for appt.for future refills.    Penni Bombard, MD 9/93/7169, 67:89 AM Certified in Neurology, Neurophysiology and Neuroimaging  [facility] [address] [place], [place] [address] [phone]

## 2020-12-10 NOTE — Telephone Encounter (Signed)
   Name: Madeline Brown  DOB: 09/07/1975  MRN: [id number]   Primary Cardiologist: Jenkins Rouge, MD  Chart reviewed as part of pre-operative protocol coverage. Patient was contacted 12/10/2020 in reference to pre-operative risk assessment for pending surgery as outlined below.  Madeline Brown was last seen on 07/12/20 by Dr. Johnsie Cancel.  Since that day, Madeline Brown has done well. She can complete more than 4.0 METS without chest pain.   Therefore, based on ACC/AHA guidelines, the patient would be at acceptable risk for the planned procedure without further cardiovascular testing.   The patient was advised that if she develops new symptoms prior to surgery to contact our office to arrange for a follow-up visit, and she verbalized understanding.  I will route this recommendation to the requesting party via Epic fax function and remove from pre-op pool. Please call with questions.  Tami Lin [place], PA 12/10/2020, 4:45 PM

## 2020-12-10 NOTE — Telephone Encounter (Signed)
Noted.  Pt has appt 12-11-20.

## 2020-12-10 NOTE — Telephone Encounter (Signed)
Left VM

## 2020-12-10 NOTE — Telephone Encounter (Signed)
LMVM for pt relayed rx filled, also reminded about appt tues 12-11-20 at 1015 with JM/NP.

## 2020-12-10 NOTE — Telephone Encounter (Signed)
     Pt is returning call  Best phone number: [phone]

## 2020-12-11 ENCOUNTER — Ambulatory Visit: Payer: Self-pay | Admitting: Adult Health

## 2020-12-11 ENCOUNTER — Telehealth: Payer: Self-pay | Admitting: Adult Health

## 2020-12-11 NOTE — Telephone Encounter (Signed)
Pt request refill levETIRAcetam (KEPPRA) 1000 MG tablet at [facility]. Would like a call from the nurse when refill has been sent.

## 2020-12-11 NOTE — Telephone Encounter (Signed)
Called pt back. Advised refill sent in 12/07/20 #60, 1 refill to [facility] already. Should be there ready for pick up. She verbalized understanding.

## 2020-12-26 ENCOUNTER — Encounter: Payer: Medicare HMO | Admitting: Certified Nurse Midwife

## 2021-01-11 NOTE — Progress Notes (Signed)
Sent message, via epic in basket, requesting orders in epic from surgeon.  

## 2021-01-11 NOTE — Progress Notes (Addendum)
COVID Vaccine Completed: Yes Date COVID Vaccine completed: x2 Has received booster:x1 COVID vaccine manufacturer: Pfizer     Date of COVID positive in last 90 days: No  PCP - Sonia Side., FNP Cardiologist - Jenkins Rouge, MD, cardiac clearance 12/07/2020 in epic Neurologist-Sethi, Lucy Antigua, MD Last office visit note by J. McCue NP 10/10/2019 in epic  Nephrologist-Dr. Naguabo last office visit note Roney Jaffe, MD 08/08/2020  Chest x-ray - 08/07/2020 in epic EKG - 06/05/2020 [facility] requested copy Stress Test - 04/11/2020 in care everywhere ECHO - 12/08/2019 in epic Cardiac Cath - N/A Pacemaker/ICD device last checked:N/A  Sleep Study - N/A CPAP - N/A  Fasting Blood Sugar - N/A Checks Blood Sugar __N/A___ times a day  Blood Thinner Instructions: N/A Aspirin Instructions:N/A Last Dose:N/A  Activity level: Can go up a flight of stairs and activities of daily living without stopping and without symptoms       Anesthesia review: HTN, Dialysis  Patient denies shortness of breath, fever, cough and chest pain at PAT appointment   Patient verbalized understanding of instructions that were given to them at the PAT appointment. Patient was also instructed that they will need to review over the PAT instructions again at home before surgery.

## 2021-01-14 ENCOUNTER — Ambulatory Visit: Payer: Self-pay | Admitting: General Surgery

## 2021-01-16 ENCOUNTER — Other Ambulatory Visit: Payer: Self-pay

## 2021-01-16 ENCOUNTER — Encounter (HOSPITAL_COMMUNITY): Payer: Self-pay | Admitting: General Surgery

## 2021-01-23 NOTE — Progress Notes (Signed)
Anesthesia Chart Review   Case: [id number] Date/Time: 02/01/21 0845   Procedure: LAPAROSCOPIC BILATERAL INGUINAL HERNIA REPAIR WITH MESH (Bilateral)   Anesthesia type: General   Pre-op diagnosis: BILATERAL INGUINAL HERNIAS   Location: [place] [facility] / [facility]   Surgeons: Kinsinger, Arta Bruce, MD       DISCUSSION:45 y.o. 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 Gurney Maxin.   Per Cardiology preoperative evaluation 12/10/2020, "Chart reviewed as part of pre-operative protocol coverage. Patient was contacted 12/10/2020 in reference to pre-operative risk assessment for pending surgery as outlined below.  Madeline Brown was last seen on 07/12/20 by Dr. Johnsie Cancel.  Since that day, Madeline Brown has done well. She can complete more than 4.0 METS without chest pain.    Therefore, based on ACC/AHA guidelines, the patient would be at acceptable risk for the planned procedure without further cardiovascular testing."  Same day workup, anesthesia to evaluate DOS. VS: Ht 5' (1.524 m)   Wt 56.2 kg   LMP 01/06/2018   BMI 24.20 kg/m   PROVIDERS: Sonia Side., FNP is PCP   Jenkins Rouge, MD is Cardiologist   Antony Contras, MD is Neurologist  [facility] LABS:  labs DOS (all labs ordered are listed, but only abnormal results are displayed)  Labs Reviewed - No data to display   IMAGES:   EKG:   CV: Echo 12/08/2019 1. Left ventricular ejection fraction, by estimation, is 60 to 65%. The  left ventricle has normal function. The left ventricle has no regional  wall motion abnormalities. There is severe left ventricular hypertrophy.  Left ventricular diastolic parameters   are indeterminate.   2. Right ventricular systolic function is normal. The right ventricular  size is normal. Tricuspid regurgitation signal is inadequate  for assessing  PA pressure.   3. The mitral valve is normal in structure. No evidence of mitral valve  regurgitation.   4. The aortic valve is tricuspid. Aortic valve regurgitation is not  visualized. No aortic stenosis is present.   5. The inferior vena cava is normal in size with greater than 50%  respiratory variability, suggesting right atrial pressure of 3 mmHg.  Past Medical History:  Diagnosis Date   Anemia    Chronic kidney disease    Headache, unspecified 09/26/2017   History of blood transfusion 07/2020   Hypertension    Seizure ([place]) 02/2019   Shortness of breath 07/15/2018   01/16/21 not currently   Stroke [facility])    Mild was told after having a seizure    Past Surgical History:  Procedure Laterality Date   AV FISTULA PLACEMENT Left 06/24/2017   Procedure: CREATION OF LEFT ARM BRACHIALCEPHALIC  ARTERIOVENOUS (AV) FISTULA;  Surgeon: Rosetta Posner, MD;  [facility];  Service: Vascular;  Laterality: Left;   CESAREAN SECTION     x2   COMPLEX WOUND CLOSURE Left 07/30/2020   Procedure: COMPLEX WOUND CLOSURE;  Surgeon: Cherre Robins, MD;  Location: [place];  Service: Vascular;  Laterality: Left;   FISTULA SUPERFICIALIZATION Left 10/28/2017   Procedure: SUPERFICIALIZATION LEFT BRACHIOCEPHALIC ARTERIOVENOUS FISTULA;  Surgeon: Rosetta Posner, MD;  Location: [place];  Service: Vascular;  Laterality: Left;   INSERTION OF DIALYSIS CATHETER Right 06/24/2017   Procedure: INSERTION OF TUNNELED DIALYSIS CATHETER;  Surgeon: Rosetta Posner, MD;  Location: [place];  Service: Vascular;  Laterality: Right;   INSERTION OF DIALYSIS CATHETER Right 07/30/2020   Procedure: INSERTION OF DIALYSIS CATHETER;  Surgeon: Cherre Robins, MD;  [facility];  Service: Vascular;  Laterality: Right;   LIGATION OF COMPETING BRANCHES OF ARTERIOVENOUS FISTULA  10/28/2017   Procedure: LIGATION OF COMPETING BRANCHES OF ARTERIOVENOUS FISTULA x3;  Surgeon: Rosetta Posner, MD;  [facility];  Service: Vascular;;    REVISON OF ARTERIOVENOUS FISTULA Left 07/30/2020   Procedure: LEFT UPPER EXTREMITY ARTERIOVENOUS FISTULA REVISON;  Surgeon: Cherre Robins, MD;  [facility];  Service: Vascular;  Laterality: Left;  PERIPHERAL NERVE BLOCK    MEDICATIONS:  sodium chloride flush (NS) 0.9 % injection 3 mL   sodium chloride flush (NS) 0.9 % injection 3 mL    acetaminophen (OFIRMEV) 10 MG/ML SOLN   amLODipine (NORVASC) 10 MG tablet   amoxicillin-clavulanate (AUGMENTIN) 500-125 MG tablet   azithromycin (ZITHROMAX) 250 MG tablet   B Complex-C-Folic Acid (DIALYVITE TABLET) TABS   carvedilol (COREG) 25 MG tablet   diphenhydrAMINE (BENADRYL) 25 MG tablet   doxercalciferol (HECTOROL) 0.5 MCG capsule   hydrALAZINE (APRESOLINE) 100 MG tablet   levETIRAcetam (KEPPRA) 1000 MG tablet   Methoxy PEG-Epoetin Beta (MIRCERA IJ)   oxyCODONE-acetaminophen (PERCOCET/ROXICET) 5-325 MG tablet   oxyCODONE-acetaminophen (PERCOCET/ROXICET) 5-325 MG tablet   oxyCODONE-acetaminophen (PERCOCET/ROXICET) 5-325 MG tablet   sevelamer carbonate (RENVELA) 800 MG tablet   spironolactone (ALDACTONE) 50 MG tablet     Konrad Felix, PA-C [facility] Pre-Surgical Testing [phone]

## 2021-01-31 MED ORDER — BUPIVACAINE LIPOSOME 1.3 % IJ SUSP
20.0000 mL | Freq: Once | INTRAMUSCULAR | Status: DC
  Filled 2021-01-31: qty 20

## 2021-02-01 ENCOUNTER — Encounter (HOSPITAL_COMMUNITY): Payer: Self-pay | Admitting: General Surgery

## 2021-02-01 ENCOUNTER — Ambulatory Visit (HOSPITAL_COMMUNITY): Payer: Medicare HMO | Admitting: Physician Assistant

## 2021-02-01 ENCOUNTER — Encounter (HOSPITAL_COMMUNITY)
Admission: RE | Disposition: A | Discharge status: Home / Self Care | Payer: Self-pay | Source: Home / Self Care | Attending: General Surgery

## 2021-02-01 ENCOUNTER — Ambulatory Visit (HOSPITAL_COMMUNITY)
Admission: RE | Admit: 2021-02-01 | Discharge: 2021-02-01 | Disposition: A | Discharge status: Home / Self Care | Payer: Medicare HMO | Attending: General Surgery | Admitting: General Surgery

## 2021-02-01 DIAGNOSIS — F17210 Nicotine dependence, cigarettes, uncomplicated: Secondary | ICD-10-CM | POA: Insufficient documentation

## 2021-02-01 DIAGNOSIS — Z992 Dependence on renal dialysis: Secondary | ICD-10-CM | POA: Diagnosis not present

## 2021-02-01 DIAGNOSIS — Z8673 Personal history of transient ischemic attack (TIA), and cerebral infarction without residual deficits: Secondary | ICD-10-CM | POA: Insufficient documentation

## 2021-02-01 DIAGNOSIS — Z79899 Other long term (current) drug therapy: Secondary | ICD-10-CM | POA: Insufficient documentation

## 2021-02-01 DIAGNOSIS — I120 Hypertensive chronic kidney disease with stage 5 chronic kidney disease or end stage renal disease: Secondary | ICD-10-CM | POA: Diagnosis not present

## 2021-02-01 DIAGNOSIS — Z8249 Family history of ischemic heart disease and other diseases of the circulatory system: Secondary | ICD-10-CM | POA: Diagnosis not present

## 2021-02-01 DIAGNOSIS — Z888 Allergy status to other drugs, medicaments and biological substances status: Secondary | ICD-10-CM | POA: Diagnosis not present

## 2021-02-01 DIAGNOSIS — Z833 Family history of diabetes mellitus: Secondary | ICD-10-CM | POA: Insufficient documentation

## 2021-02-01 DIAGNOSIS — N186 End stage renal disease: Secondary | ICD-10-CM | POA: Diagnosis not present

## 2021-02-01 DIAGNOSIS — K4020 Bilateral inguinal hernia, without obstruction or gangrene, not specified as recurrent: Secondary | ICD-10-CM | POA: Insufficient documentation

## 2021-02-01 HISTORY — PX: INGUINAL HERNIA REPAIR: SHX194

## 2021-02-01 HISTORY — DX: Cerebral infarction, unspecified: I63.9

## 2021-02-01 LAB — BASIC METABOLIC PANEL
Anion gap: 11 (ref 5–15)
BUN: 44 mg/dL — ABNORMAL HIGH (ref 6–20)
CO2: 27 mmol/L (ref 22–32)
Calcium: 8.6 mg/dL — ABNORMAL LOW (ref 8.9–10.3)
Chloride: 102 mmol/L (ref 98–111)
Creatinine, Ser: 6.93 mg/dL — ABNORMAL HIGH (ref 0.44–1.00)
GFR, Estimated: 7 mL/min — ABNORMAL LOW (ref >60)
Glucose, Bld: 90 mg/dL (ref 70–99)
Potassium: 4 mmol/L (ref 3.5–5.1)
Sodium: 140 mmol/L (ref 135–145)

## 2021-02-01 LAB — CBC
HCT: 34.2 % — ABNORMAL LOW (ref 36.0–46.0)
Hemoglobin: 11.1 g/dL — ABNORMAL LOW (ref 12.0–15.0)
MCH: 33 pg (ref 26.0–34.0)
MCHC: 32.5 g/dL (ref 30.0–36.0)
MCV: 101.8 fL — ABNORMAL HIGH (ref 80.0–100.0)
Platelets: 97 10*3/uL — ABNORMAL LOW (ref 150–400)
RBC: 3.36 MIL/uL — ABNORMAL LOW (ref 3.87–5.11)
RDW: 13.3 % (ref 11.5–15.5)
WBC: 4.9 10*3/uL (ref 4.0–10.5)
nRBC: 0 % (ref 0.0–0.2)

## 2021-02-01 SURGERY — REPAIR, HERNIA, INGUINAL, BILATERAL, LAPAROSCOPIC
Anesthesia: General | Site: Abdomen | Laterality: Bilateral

## 2021-02-01 MED ORDER — BUPIVACAINE LIPOSOME 1.3 % IJ SUSP
INTRAMUSCULAR | Status: DC | PRN
  Administered 2021-02-01: 20 mL

## 2021-02-01 MED ORDER — ENSURE PRE-SURGERY PO LIQD: 296.0000 mL | Freq: Once | ORAL | Status: DC

## 2021-02-01 MED ORDER — MIDAZOLAM HCL 2 MG/2ML IJ SOLN
INTRAMUSCULAR | Status: AC
  Filled 2021-02-01: qty 2

## 2021-02-01 MED ORDER — OXYCODONE HCL 5 MG PO TABS: 5.0000 mg | ORAL_TABLET | Freq: Four times a day (QID) | ORAL | 0 refills | Status: DC | PRN

## 2021-02-01 MED ORDER — ORAL CARE MOUTH RINSE: 15.0000 mL | Freq: Once | OROMUCOSAL | Status: AC

## 2021-02-01 MED ORDER — CISATRACURIUM BESYLATE (PF) 10 MG/5ML IV SOLN
INTRAVENOUS | Status: DC | PRN
  Administered 2021-02-01: 10 mg via INTRAVENOUS
  Administered 2021-02-01: 4 mg via INTRAVENOUS

## 2021-02-01 MED ORDER — PROPOFOL 500 MG/50ML IV EMUL
INTRAVENOUS | Status: AC
  Filled 2021-02-01: qty 50

## 2021-02-01 MED ORDER — CHLORHEXIDINE GLUCONATE 0.12 % MT SOLN
15.0000 mL | Freq: Once | OROMUCOSAL | Status: AC
  Administered 2021-02-01: 15 mL via OROMUCOSAL

## 2021-02-01 MED ORDER — DEXAMETHASONE SODIUM PHOSPHATE 4 MG/ML IJ SOLN
INTRAMUSCULAR | Status: DC | PRN
  Administered 2021-02-01: 5 mg via INTRAVENOUS

## 2021-02-01 MED ORDER — FENTANYL CITRATE (PF) 100 MCG/2ML IJ SOLN
25.0000 ug | INTRAMUSCULAR | Status: DC | PRN
  Administered 2021-02-01: 25 ug via INTRAVENOUS

## 2021-02-01 MED ORDER — LACTATED RINGERS IV SOLN: INTRAVENOUS | Status: DC

## 2021-02-01 MED ORDER — GLYCOPYRROLATE 0.2 MG/ML IJ SOLN
INTRAMUSCULAR | Status: AC
  Filled 2021-02-01: qty 2

## 2021-02-01 MED ORDER — NEOSTIGMINE METHYLSULFATE 10 MG/10ML IV SOLN
INTRAVENOUS | Status: DC | PRN
  Administered 2021-02-01: 3 mg via INTRAVENOUS

## 2021-02-01 MED ORDER — LIDOCAINE HCL 2 % IJ SOLN
INTRAMUSCULAR | Status: AC
  Filled 2021-02-01: qty 20

## 2021-02-01 MED ORDER — ONDANSETRON HCL 4 MG/2ML IJ SOLN
INTRAMUSCULAR | Status: DC | PRN
  Administered 2021-02-01: 4 mg via INTRAVENOUS

## 2021-02-01 MED ORDER — CISATRACURIUM BESYLATE 20 MG/10ML IV SOLN
INTRAVENOUS | Status: AC
  Filled 2021-02-01: qty 20

## 2021-02-01 MED ORDER — BUPIVACAINE-EPINEPHRINE (PF) 0.25% -1:200000 IJ SOLN
INTRAMUSCULAR | Status: DC | PRN
  Administered 2021-02-01: 30 mL

## 2021-02-01 MED ORDER — OXYCODONE HCL 5 MG/5ML PO SOLN: 5.0000 mg | Freq: Once | ORAL | Status: AC | PRN

## 2021-02-01 MED ORDER — MIDAZOLAM HCL 5 MG/5ML IJ SOLN
INTRAMUSCULAR | Status: DC | PRN
  Administered 2021-02-01 (×2): 1 mg via INTRAVENOUS

## 2021-02-01 MED ORDER — FENTANYL CITRATE (PF) 250 MCG/5ML IJ SOLN
INTRAMUSCULAR | Status: AC
  Filled 2021-02-01: qty 5

## 2021-02-01 MED ORDER — ROCURONIUM BROMIDE 10 MG/ML (PF) SYRINGE
PREFILLED_SYRINGE | INTRAVENOUS | Status: AC
  Filled 2021-02-01: qty 10

## 2021-02-01 MED ORDER — CHLORHEXIDINE GLUCONATE CLOTH 2 % EX PADS: 6.0000 | MEDICATED_PAD | Freq: Once | CUTANEOUS | Status: DC

## 2021-02-01 MED ORDER — LIP MEDEX EX OINT
TOPICAL_OINTMENT | CUTANEOUS | Status: AC
  Filled 2021-02-01: qty 7

## 2021-02-01 MED ORDER — FENTANYL CITRATE (PF) 100 MCG/2ML IJ SOLN
INTRAMUSCULAR | Status: AC
  Filled 2021-02-01: qty 2

## 2021-02-01 MED ORDER — SODIUM CHLORIDE 0.9 % IV SOLN: INTRAVENOUS | Status: DC

## 2021-02-01 MED ORDER — DEXAMETHASONE SODIUM PHOSPHATE 10 MG/ML IJ SOLN
INTRAMUSCULAR | Status: AC
  Filled 2021-02-01: qty 1

## 2021-02-01 MED ORDER — 0.9 % SODIUM CHLORIDE (POUR BTL) OPTIME
TOPICAL | Status: DC | PRN
  Administered 2021-02-01: 1000 mL

## 2021-02-01 MED ORDER — GLYCOPYRROLATE 0.2 MG/ML IJ SOLN
INTRAMUSCULAR | Status: DC | PRN
  Administered 2021-02-01: .1 mg via INTRAVENOUS
  Administered 2021-02-01: .4 mg via INTRAVENOUS

## 2021-02-01 MED ORDER — BUPIVACAINE-EPINEPHRINE (PF) 0.25% -1:200000 IJ SOLN
INTRAMUSCULAR | Status: AC
  Filled 2021-02-01: qty 30

## 2021-02-01 MED ORDER — NEOSTIGMINE METHYLSULFATE 3 MG/3ML IV SOSY
PREFILLED_SYRINGE | INTRAVENOUS | Status: AC
  Filled 2021-02-01: qty 3

## 2021-02-01 MED ORDER — OXYCODONE HCL 5 MG PO TABS
5.0000 mg | ORAL_TABLET | Freq: Once | ORAL | Status: AC | PRN
Start: 2021-02-01 — End: 2021-02-01
  Administered 2021-02-01: 5 mg via ORAL

## 2021-02-01 MED ORDER — OXYCODONE HCL 5 MG PO TABS
ORAL_TABLET | ORAL | Status: AC
  Filled 2021-02-01: qty 1

## 2021-02-01 MED ORDER — PROPOFOL 10 MG/ML IV BOLUS
INTRAVENOUS | Status: DC | PRN
  Administered 2021-02-01: 100 mg via INTRAVENOUS

## 2021-02-01 MED ORDER — LIDOCAINE 2% (20 MG/ML) 5 ML SYRINGE
INTRAMUSCULAR | Status: DC | PRN
  Administered 2021-02-01: 60 mg via INTRAVENOUS

## 2021-02-01 MED ORDER — ONDANSETRON HCL 4 MG/2ML IJ SOLN
INTRAMUSCULAR | Status: AC
  Filled 2021-02-01: qty 2

## 2021-02-01 MED ORDER — LIDOCAINE 2% (20 MG/ML) 5 ML SYRINGE
INTRAMUSCULAR | Status: AC
  Filled 2021-02-01: qty 5

## 2021-02-01 MED ORDER — FENTANYL CITRATE (PF) 100 MCG/2ML IJ SOLN
INTRAMUSCULAR | Status: DC | PRN
  Administered 2021-02-01 (×3): 50 ug via INTRAVENOUS
  Administered 2021-02-01: 100 ug via INTRAVENOUS

## 2021-02-01 MED ORDER — ACETAMINOPHEN 500 MG PO TABS
1000.0000 mg | ORAL_TABLET | ORAL | Status: AC
  Administered 2021-02-01: 1000 mg via ORAL
  Filled 2021-02-01: qty 2

## 2021-02-01 MED ORDER — CEFAZOLIN SODIUM-DEXTROSE 2-4 GM/100ML-% IV SOLN
2.0000 g | INTRAVENOUS | Status: AC
  Administered 2021-02-01: 2 g via INTRAVENOUS
  Filled 2021-02-01: qty 100

## 2021-02-01 MED ORDER — PROMETHAZINE HCL 25 MG/ML IJ SOLN: 6.2500 mg | INTRAMUSCULAR | Status: DC | PRN

## 2021-02-01 MED ORDER — CELECOXIB 200 MG PO CAPS
400.0000 mg | ORAL_CAPSULE | ORAL | Status: AC
  Administered 2021-02-01: 400 mg via ORAL
  Filled 2021-02-01: qty 2

## 2021-02-01 SURGICAL SUPPLY — 36 items
APPLIER CLIP 5 13 M/L LIGAMAX5 (MISCELLANEOUS)
BENZOIN TINCTURE PRP APPL 2/3 (GAUZE/BANDAGES/DRESSINGS) IMPLANT
BNDG ADH 1X3 SHEER STRL LF (GAUZE/BANDAGES/DRESSINGS) IMPLANT
CABLE HIGH FREQUENCY MONO STRZ (ELECTRODE) ×2 IMPLANT
CHLORAPREP W/TINT 26 (MISCELLANEOUS) ×2 IMPLANT
CLIP APPLIE 5 13 M/L LIGAMAX5 (MISCELLANEOUS) IMPLANT
COVER SURGICAL LIGHT HANDLE (MISCELLANEOUS) ×2 IMPLANT
DECANTER SPIKE VIAL GLASS SM (MISCELLANEOUS) IMPLANT
DERMABOND ADVANCED (GAUZE/BANDAGES/DRESSINGS)
DERMABOND ADVANCED .7 DNX12 (GAUZE/BANDAGES/DRESSINGS) IMPLANT
DEVICE SECURE STRAP 25 ABSORB (INSTRUMENTS) IMPLANT
DEVICE TROCAR PUNCTURE CLOSURE (ENDOMECHANICALS) ×2 IMPLANT
ELECT REM PT RETURN 15FT ADLT (MISCELLANEOUS) ×2 IMPLANT
GLOVE SURG POLYISO LF SZ7 (GLOVE) ×2 IMPLANT
GLOVE SURG UNDER POLY LF SZ7 (GLOVE) ×2 IMPLANT
GOWN STRL REUS W/TWL LRG LVL3 (GOWN DISPOSABLE) ×2 IMPLANT
GOWN STRL REUS W/TWL XL LVL3 (GOWN DISPOSABLE) ×4 IMPLANT
KIT BASIN OR (CUSTOM PROCEDURE TRAY) ×2 IMPLANT
KIT TURNOVER KIT A (KITS) ×2 IMPLANT
MESH 3DMAX 4X6 LT LRG (Mesh General) ×2 IMPLANT
MESH 3DMAX 4X6 RT LRG (Mesh General) ×2 IMPLANT
SCISSORS LAP 5X35 DISP (ENDOMECHANICALS) IMPLANT
SET IRRIG TUBING LAPAROSCOPIC (IRRIGATION / IRRIGATOR) IMPLANT
SET TUBE SMOKE EVAC HIGH FLOW (TUBING) ×2 IMPLANT
SHEARS HARMONIC ACE PLUS 36CM (ENDOMECHANICALS) ×2 IMPLANT
SLEEVE XCEL OPT CAN 5 100 (ENDOMECHANICALS) ×2 IMPLANT
STRIP CLOSURE SKIN 1/2X4 (GAUZE/BANDAGES/DRESSINGS) IMPLANT
SUT MNCRL AB 4-0 PS2 18 (SUTURE) ×2 IMPLANT
SUT VIC AB 3-0 SH 27 (SUTURE) ×1
SUT VIC AB 3-0 SH 27XBRD (SUTURE) ×1 IMPLANT
SUT VICRYL 0 UR6 27IN ABS (SUTURE) ×2 IMPLANT
TOWEL OR 17X26 10 PK STRL BLUE (TOWEL DISPOSABLE) ×2 IMPLANT
TRAY FOLEY MTR SLVR 16FR STAT (SET/KITS/TRAYS/PACK) IMPLANT
TRAY LAPAROSCOPIC (CUSTOM PROCEDURE TRAY) ×2 IMPLANT
TROCAR BLADELESS OPT 5 100 (ENDOMECHANICALS) ×2 IMPLANT
TROCAR XCEL 12X100 BLDLESS (ENDOMECHANICALS) ×2 IMPLANT

## 2021-02-01 NOTE — H&P (Signed)
Madeline Brown is an 45 y.o. female.   Chief Complaint: hernias HPI: 45 yo female with multiple medical problems with lower abdominal pain. She was found to have bilateral inguinal hernias and presents for repair.  Past Medical History:  Diagnosis Date   Anemia    Chronic kidney disease    Headache, unspecified 09/26/2017   History of blood transfusion 07/2020   Hypertension    Seizure ([place]) 02/2019   Shortness of breath 07/15/2018   01/16/21 not currently   Stroke [facility])    Mild was told after having a seizure    Past Surgical History:  Procedure Laterality Date   AV FISTULA PLACEMENT Left 06/24/2017   Procedure: CREATION OF LEFT ARM BRACHIALCEPHALIC  ARTERIOVENOUS (AV) FISTULA;  Surgeon: Rosetta Posner, MD;  Location: [place];  Service: Vascular;  Laterality: Left;   CESAREAN SECTION     x2   COMPLEX WOUND CLOSURE Left 07/30/2020   Procedure: COMPLEX WOUND CLOSURE;  Surgeon: Cherre Robins, MD;  Location: [place];  Service: Vascular;  Laterality: Left;   FISTULA SUPERFICIALIZATION Left 10/28/2017   Procedure: SUPERFICIALIZATION LEFT BRACHIOCEPHALIC ARTERIOVENOUS FISTULA;  Surgeon: Rosetta Posner, MD;  Location: [place];  Service: Vascular;  Laterality: Left;   INSERTION OF DIALYSIS CATHETER Right 06/24/2017   Procedure: INSERTION OF TUNNELED DIALYSIS CATHETER;  Surgeon: Rosetta Posner, MD;  Location: [place];  Service: Vascular;  Laterality: Right;   INSERTION OF DIALYSIS CATHETER Right 07/30/2020   Procedure: INSERTION OF DIALYSIS CATHETER;  Surgeon: Cherre Robins, MD;  [facility];  Service: Vascular;  Laterality: Right;   LIGATION OF COMPETING BRANCHES OF ARTERIOVENOUS FISTULA  10/28/2017   Procedure: LIGATION OF COMPETING BRANCHES OF ARTERIOVENOUS FISTULA x3;  Surgeon: Rosetta Posner, MD;  Location: [place];  Service: Vascular;;   REVISON OF ARTERIOVENOUS FISTULA Left 07/30/2020   Procedure: LEFT UPPER EXTREMITY ARTERIOVENOUS FISTULA REVISON;  Surgeon: Cherre Robins, MD;  [facility];   Service: Vascular;  Laterality: Left;  PERIPHERAL NERVE BLOCK    Family History  Problem Relation Age of Onset   Diabetes Father    Hypertension Father    Cancer Father    Heart disease Father    Aneurysm Mother    Seizures Mother    Social History:  reports that she has been smoking cigarettes. She has a 3.75 pack-year smoking history. She has never used smokeless tobacco. She reports that she does not currently use alcohol. She reports current drug use. Drug: Marijuana.  Allergies:  Allergies  Allergen Reactions   Visine [Tetrahydrozoline Hcl] Swelling    Eyes Swelling    Facility-Administered Medications Prior to Admission  Medication Dose Route Frequency Provider Last Rate Last Admin   sodium chloride flush (NS) 0.9 % injection 3 mL  3 mL Intravenous Q12H Cherre Robins, MD       sodium chloride flush (NS) 0.9 % injection 3 mL  3 mL Intravenous PRN Cherre Robins, MD       Medications Prior to Admission  Medication Sig Dispense Refill   acetaminophen (TYLENOL) 500 MG tablet Take 500-1,000 mg by mouth every 6 (six) hours as needed (for pain.).     amLODipine (NORVASC) 10 MG tablet Take 1 tablet (10 mg total) by mouth at bedtime. 30 tablet 0   B Complex-C-Folic Acid (DIALYVITE TABLET) TABS Take 1 tablet by mouth daily.     carvedilol (COREG) 25 MG tablet Take 1 tablet (25 mg total) by mouth 2 (  two) times daily with a meal. 60 tablet 0   diphenhydrAMINE (BENADRYL) 25 MG tablet Take 25 mg by mouth every 6 (six) hours as needed for itching or sleep.     doxercalciferol (HECTOROL) 0.5 MCG capsule Doxercalciferol (Hectorol)     hydrALAZINE (APRESOLINE) 100 MG tablet Take 1 tablet (100 mg total) by mouth 3 (three) times daily. 90 tablet 0   levETIRAcetam (KEPPRA) 1000 MG tablet Take 1 tablet (1,000 mg total) by mouth 2 (two) times daily. (Patient taking differently: Take 1,000 mg by mouth in the morning.) 60 tablet 1   Methoxy PEG-Epoetin Beta (MIRCERA IJ) Mircera     sevelamer  carbonate (RENVELA) 800 MG tablet Take 2 tablets (1,600 mg total) by mouth 3 (three) times daily with meals. (Patient taking differently: Take 1,600 mg by mouth See admin instructions. Take 2 tablets (1600 mg) by mouth 3 times daily with meals & take 1 tablet (800 mg) by mouth with snacks) 30 tablet 0   spironolactone (ALDACTONE) 50 MG tablet Take 1 tablet (50 mg total) by mouth daily. 30 tablet 0   azithromycin (ZITHROMAX) 250 MG tablet Take one tab daily (Patient not taking: Reported on 01/29/2021) 3 each 0   oxyCODONE-acetaminophen (PERCOCET/ROXICET) 5-325 MG tablet Take 1 tablet by mouth every 6 (six) hours as needed. (Patient not taking: Reported on 01/29/2021) 30 tablet 0   oxyCODONE-acetaminophen (PERCOCET/ROXICET) 5-325 MG tablet Take 1 tablet by mouth every 4 (four) hours as needed for moderate pain. (Patient not taking: Reported on 01/29/2021) 20 tablet 0    Results for orders placed or performed during the hospital encounter of 02/01/21 (from the past 48 hour(s))  CBC per protocol     Status: Abnormal   Collection Time: 02/01/21  7:30 AM  Result Value Ref Range   WBC 4.9 4.0 - 10.5 K/uL   RBC 3.36 (L) 3.87 - 5.11 MIL/uL   Hemoglobin 11.1 (L) 12.0 - 15.0 g/dL   HCT 34.2 (L) 36.0 - 46.0 %   MCV 101.8 (H) 80.0 - 100.0 fL   MCH 33.0 26.0 - 34.0 pg   MCHC 32.5 30.0 - 36.0 g/dL   RDW 13.3 11.5 - 15.5 %   Platelets 97 (L) 150 - 400 K/uL    Comment: Immature Platelet Fraction may be clinically indicated, consider ordering this additional test [id number]    nRBC 0.0 0.0 - 0.2 %    Comment: Performed at [facility], [place] [address] [place], [place] [address]  Basic metabolic panel     Status: Abnormal   Collection Time: 02/01/21  7:41 AM  Result Value Ref Range   Sodium 140 135 - 145 mmol/L   Potassium 4.0 3.5 - 5.1 mmol/L   Chloride 102 98 - 111 mmol/L   CO2 27 22 - 32 mmol/L   Glucose, Bld 90 70 - 99 mg/dL    Comment: Glucose reference range applies only to  samples taken after fasting for at least 8 hours.   BUN 44 (H) 6 - 20 mg/dL   Creatinine, Ser 6.93 (H) 0.44 - 1.00 mg/dL   Calcium 8.6 (L) 8.9 - 10.3 mg/dL   GFR, Estimated 7 (L) >60 mL/min    Comment: (NOTE) Calculated using the CKD-EPI Creatinine Equation (2021)    Anion gap 11 5 - 15    Comment: Performed at [facility], [place] [address], [place], [place] [address]   No results found.  Review of Systems  Constitutional:  Negative for chills  and fever.  HENT:  Negative for hearing loss.   Respiratory:  Negative for cough.   Cardiovascular:  Negative for chest pain and palpitations.  Gastrointestinal:  Negative for abdominal pain, nausea and vomiting.  Genitourinary:  Negative for dysuria and urgency.  Musculoskeletal:  Negative for myalgias and neck pain.  Skin:  Negative for rash.  Neurological:  Negative for dizziness and headaches.  Hematological:  Does not bruise/bleed easily.  Psychiatric/Behavioral:  Negative for suicidal ideas.    Blood pressure (!) 147/101, pulse 89, temperature 97.8 F (36.6 C), temperature source Oral, resp. rate 18, height 5' (1.524 m), weight 56.2 kg, last menstrual period 01/06/2018, SpO2 97 %, unknown if currently breastfeeding. Physical Exam Vitals reviewed.  Constitutional:      Appearance: She is well-developed.  HENT:     Head: Normocephalic and atraumatic.  Eyes:     Conjunctiva/sclera: Conjunctivae normal.     Pupils: Pupils are equal, round, and reactive to light.  Cardiovascular:     Rate and Rhythm: Normal rate and regular rhythm.  Pulmonary:     Effort: Pulmonary effort is normal.     Breath sounds: Normal breath sounds.  Abdominal:     General: Bowel sounds are normal. There is no distension.     Palpations: Abdomen is soft.     Tenderness: There is no abdominal tenderness.  Musculoskeletal:        General: Normal range of motion.     Cervical back: Normal range of motion and neck supple.  Skin:     General: Skin is warm and dry.  Neurological:     Mental Status: She is alert and oriented to person, place, and time.  Psychiatric:        Behavior: Behavior normal.     Assessment/Plan 45 yo female with bilateral inguinal hernias -lap bilateral inguinal hernia repair with mesh -outpatient procedure  Mickeal Skinner, MD 02/01/2021, 8:51 AM

## 2021-02-01 NOTE — Anesthesia Preprocedure Evaluation (Signed)
Anesthesia Evaluation  Patient identified by MRN, date of birth, ID band Patient awake    Reviewed: Allergy & Precautions, NPO status , Patient's Chart, lab work & pertinent test results  Airway Mallampati: II  TM Distance: >3 FB Neck ROM: Full    Dental no notable dental hx.    Pulmonary Current SmokerPatient did not abstain from smoking.,    Pulmonary exam normal breath sounds clear to auscultation       Cardiovascular hypertension, Pt. on medications and Pt. on home beta blockers Normal cardiovascular exam Rhythm:Regular Rate:Normal     Neuro/Psych  Headaches, Seizures -,  CVA, No Residual Symptoms negative psych ROS   GI/Hepatic negative GI ROS, Neg liver ROS,   Endo/Other  negative endocrine ROS  Renal/GU ESRF and DialysisRenal diseaseOn HD T, R, Sat     Musculoskeletal negative musculoskeletal ROS (+)   Abdominal   Peds  Hematology  (+) Blood dyscrasia, anemia , Thrombocytopenia    Anesthesia Other Findings BILATERAL INGUINAL HERNIAS  Reproductive/Obstetrics                             Anesthesia Physical Anesthesia Plan  ASA: 3  Anesthesia Plan: General   Post-op Pain Management:    Induction: Intravenous  PONV Risk Score and Plan: 2 and Ondansetron, Dexamethasone, Midazolam, Propofol infusion and Treatment may vary due to age or medical condition  Airway Management Planned: Oral ETT  Additional Equipment:   Intra-op Plan:   Post-operative Plan: Extubation in OR  Informed Consent: I have reviewed the patients History and Physical, chart, labs and discussed the procedure including the risks, benefits and alternatives for the proposed anesthesia with the patient or authorized representative who has indicated his/her understanding and acceptance.     Dental advisory given  Plan Discussed with: CRNA  Anesthesia Plan Comments:         Anesthesia Quick  Evaluation

## 2021-02-01 NOTE — Anesthesia Postprocedure Evaluation (Signed)
Anesthesia Post Note  Patient: Madeline Brown  Procedure(s) Performed: LAPAROSCOPIC BILATERAL INGUINAL HERNIA REPAIR WITH MESH (Bilateral: Abdomen)     Patient location during evaluation: PACU Anesthesia Type: General Level of consciousness: awake Pain management: pain level controlled Vital Signs Assessment: post-procedure vital signs reviewed and stable Respiratory status: spontaneous breathing, nonlabored ventilation, respiratory function stable and patient connected to nasal cannula oxygen Cardiovascular status: blood pressure returned to baseline and stable Postop Assessment: no apparent nausea or vomiting Anesthetic complications: no   No notable events documented.  Last Vitals:  Vitals:   02/01/21 1245 02/01/21 1330  BP: (!) 136/93 (!) 130/91  Pulse: 70 70  Resp: 10 12  Temp:  36.5 C  SpO2: 99% 93%    Last Pain:  Vitals:   02/01/21 1330  TempSrc:   PainSc: 5                  Tharon Kitch P Maiana Hennigan

## 2021-02-01 NOTE — Transfer of Care (Signed)
Immediate Anesthesia Transfer of Care Note  Patient: Madeline Brown  Procedure(s) Performed: LAPAROSCOPIC BILATERAL INGUINAL HERNIA REPAIR WITH MESH (Bilateral: Abdomen)  Patient Location: PACU  Anesthesia Type:General  Level of Consciousness: awake, alert , oriented and patient cooperative  Airway & Oxygen Therapy: Patient Spontanous Breathing and Patient connected to face mask  Post-op Assessment: Report given to RN and Post -op Vital signs reviewed and stable  Post vital signs: Reviewed and stable  Last Vitals:  Vitals Value Taken Time  BP 172/110 02/01/21 1107  Temp    Pulse 83 02/01/21 1108  Resp 23 02/01/21 1108  SpO2 100 % 02/01/21 1108  Vitals shown include unvalidated device data.  Last Pain:  Vitals:   02/01/21 0717  TempSrc:   PainSc: 0-No pain      Patients Stated Pain Goal: 3 (38/33/38 3291)  Complications: No notable events documented.

## 2021-02-01 NOTE — Anesthesia Procedure Notes (Signed)
Procedure Name: Intubation Date/Time: 02/01/2021 9:33 AM Performed by: Claudia Desanctis, CRNA Pre-anesthesia Checklist: Patient identified, Emergency Drugs available, Suction available and Patient being monitored Patient Re-evaluated:Patient Re-evaluated prior to induction Oxygen Delivery Method: Circle system utilized Preoxygenation: Pre-oxygenation with 100% oxygen Induction Type: IV induction Ventilation: Mask ventilation without difficulty Laryngoscope Size: Mac and 4 Grade View: Grade I Tube type: Oral Number of attempts: 1 Airway Equipment and Method: Stylet Placement Confirmation: ETT inserted through vocal cords under direct vision, positive ETCO2, breath sounds checked- equal and bilateral and CO2 detector Secured at: 22 cm Tube secured with: Tape Dental Injury: Teeth and Oropharynx as per pre-operative assessment  Comments: Performed by Loura Pardon

## 2021-02-01 NOTE — Op Note (Signed)
Preop diagnosis: bilateral inguinal hernia  Postop diagnosis: bilateral indirect inguinal hernia  Procedure: laparoscopic Bilateral inguinal hernia repair with mesh  Surgeon: Gurney Maxin, M.D.  Asst: [name], M.D. (resident)  Anesthesia: Gen.   Indications for procedure: Madeline Brown is a 45 y.o. female with symptoms of pain and enlarging Bilateral inguinal hernia(s). After discussing risks, alternatives and benefits she decided on laparoscopic repair and was brought to day surgery for repair.  Description of procedure: The patient was brought into the operative suite, placed supine. Anesthesia was administered with endotracheal tube. Patient was strapped in place. The patient was prepped and draped in the usual sterile fashion.  A small left subcostal incision was made. A 22mm trocar was used to gain access to the peritoneal cavity by optical entry technique. Pneumoperitoneum was applied with a high flow and low pressure. The laparoscope was reinserted to confirm position.   On initial visualization , there was a moderate sized left indirect inguinal hernia and a small right indirect inguinal hernia.  1 5 mm trocar was placed in the right upper quadrant. 1 12 mm trocar was placed in the infraumbilical space. Bilateral TAP blocks were placed with Exparel:Marcine mix. Next a flap of peritoneum was made on the right and left with harmonic scalpel. Next I began our dissection identifying the ASIS on the right laterally and then working back medially removing the filmy tissue and adhesions of the peritoneum to the abdominal wall. Hernia sac was completely dissected out of the canal. Round ligament was safely dissected away of the hernia sac.   Next I began our dissection identifying the ASIS on the left laterally and then working back medially removing the filmy tissue and adhesions of the peritoneum to the abdominal wall. Hernia sac was completely dissected out of the canal. Round  ligament was safely dissected away of the hernia sac.   There was a small tear in the peritoneum near the sac on each side which were both repaired with interrupted 3-0 vicryl  A large right 3D max mesh was inserted and tacked medially to the lacunar ligament. A large left 3D max mesh was inserted and tacked medialy to the lacunar ligament. The mesh was positioned flat and directly up against the direct and indirect areas. The peritoneal flaps were reapposed with tacks. The umbilical defect was closed with 0 vicryl by suture passer The CO2 was evacuated while watching to ensure the mesh did not migrate. All skin incisions were closed with 4-0 monocryl subcu stitch. The patient awoke from anesthesia and was brought to PACU in stable condition.  Findings: bilateral indirect inguinal hernia  Specimen: none  Blood loss: 20 ml  Local anesthesia: 50 ml Exparel:Marcaine mix  Complications: none  Implant: right and left large Bard 3D max mesh  Gurney Maxin, M.D. General, Bariatric, & Minimally Invasive Surgery [facility], [place] 10:54 AM 02/01/2021

## 2021-02-03 ENCOUNTER — Encounter (HOSPITAL_COMMUNITY): Payer: Self-pay | Admitting: General Surgery

## 2021-02-20 ENCOUNTER — Ambulatory Visit: Payer: Medicare HMO | Admitting: Adult Health

## 2021-02-20 NOTE — Progress Notes (Deleted)
[facility] [address] [place]. [place] [address] [phone]       STROKE FOLLOW UP NOTE  [name] Date of Birth:  1975-11-15 Medical Record Number:  [id number]   Reason for Referral: Left ICH and seizure follow up    CHIEF COMPLAINT:  No chief complaint on file.   HPI:   Today, 02/20/2021, Madeline Brown returns for overdue stroke and seizure follow-up.  Overall stable.  Denies new or reoccurring stroke/TIA symptoms.  Denies any seizure activity.  On Keppra although only taking 100mg  daily vs BID as ordered.  Compliant on atorvastatin ***.  Blood pressures today ***.  Continues to receive HD for ESRD.      History provided for reference purposes only Update 10/10/2019 JM: Madeline Brown returns for follow-up regarding left ICH with subsequent seizure activity in 02/2019 after multiple prior no-show visits.  She has been stable since prior visit without residual stroke deficits or new/reoccurring stroke/TIA symptoms.  Recommended repeating MRI for possible underlying ICH etiologies with order placed but not completed at this time. Continues on Keppra 1000 mg daily tolerating well with questionable breakthrough seizure 1 week ago with transient speech difficulty lasting approximately 5 to 10 minutes and then resolved after sitting with eyes closed taking deep breaths.  She does report increased stressful event prior and symptoms similar to prior seizure-like activity.  No prior breakthrough seizure.  Continues on atorvastatin for secondary stroke prevention.  Blood pressure today 152/102. She does not routinely monitor at home but again, plans on obtaining cuff to monitor at home. Monitors at HD and range 140-160/103-110 prior to HD and improve after HD.  Continues to receive HD and currently in the process of finding a living donor for kidney transplant.  No concerns at this time.  Initial visit 03/23/2019 JM: Madeline Brown is being seen today for hospital follow-up.   She has been stable since discharge without reoccurring or new stroke/TIA symptoms.  She has continued on Keppra 1000 mg daily without recurrent seizure activity.  She is questioning need of continuation of Keppra at this time.  She has continued on atorvastatin without side effects or myalgias.  She is questioning need of continuation statin therapy.  Blood pressure today elevated at 162/98, asymptomatic.  She has not taken her a.m. antihypertensives as she will usually take them around 10 AM (currently 8 AM) due to dialysis.  She does not currently monitor at home but is planning on obtaining cuff this Friday to monitor at home.  She does have follow-up with PCP on 04/02/2019.  Of note, she does report her mother passed away from a ruptured aneurysm and also had history of seizures.  No further concerns at this time.   Stroke admission 02/20/2019: Madeline Brown is a 45 y.o. female with history of ESRD s/p HD who presented to [facility] on 02/20/2019 after having 1 min episode of whole body shaking and frothing at the mouth once home after HD. She has no hx seizure. She was brought to the ED. EEG showed left parietal occipital sharp waves and left posterior quadrant activity and initiated Keppra 1000 mg daily.  CT head showed 2.1 m parietal mass with medium density rim and vasogenic edema.  MRI showed 20 x 13 mm left parietal mass with subacute hemorrhage and mild vasogenic edema estimated 8 to 43 weeks old likely secondary to uncontrolled HTN.  MRI showed torturous right ICA without vascular lesion and left VA stenosis/occlusion.  Carotid Dopplers unremarkable.  2D echo previously performed 10/2018 with normal EF and RA & LA dilated.  LDL 138.  A1c 4.3.  UDS positive for THC.  No antithrombotic therapy recommended due to hemorrhage.  Recommended repeat MRI w/wo outpatient to assess for underlying source of hemorrhage.  HTN stable and continued amlodipine, Coreg and spironolactone. Initiated atorvastatin 20 mg daily for  HLD management.  No prior history or evidence of DM.  Current tobacco user smoking cessation counseling provided.  Other stroke risk factors include EtOH use and THC use.  Discharged home with recommendations of home health PT.  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 No hemorrhage.     MRI  20x13 mm L parietal mass w/ subacute hemorrhage and mild vasogenic edema.  MRA  Tortuous R ICA. No vascular lesion. L VA stenosis/occlusion  Carotid Doppler  B ICA 1-39% stenosis, VAs antegrade  2D Echo 10/2018 EF >65%. RA & LA dilated. LDL 138 -initiated atorvastatin HgbA1c 4.3 UDS positive for THC SCDs for VTE prophylaxis No antithrombotic prior to admission, now on No antithrombotic given hemorrhage. Follow up at [facility] to consider antiplatelet at that time Therapy recommendations:  [facility] PT Disposition: home Repeat MRI with and without contrast in 1 month to look for underlying source of hemorrhage at outpt follow up  [place]  No hx sz Witnessed seizure at home post HD EEG 1) left parieto-occiptal sharp waves 2) left posterior quadrant slow activity On Keppra 1000 daily Continue keppra on d/c Follow up with [facility]  No driving unless 6 months free of seizure     ROS:   14 system review of systems performed and negative with exception of no concerns  PMH:  Past Medical History:  Diagnosis Date   Anemia    Chronic kidney disease    Headache, unspecified 09/26/2017   History of blood transfusion 07/2020   Hypertension    Seizure ([place]) 02/2019   Shortness of breath 07/15/2018   01/16/21 not currently   Stroke ([place])    Mild was told after having a seizure    PSH:  Past Surgical History:  Procedure Laterality Date   AV FISTULA PLACEMENT Left 06/24/2017   Procedure: CREATION OF LEFT ARM BRACHIALCEPHALIC  ARTERIOVENOUS (AV) FISTULA;  Surgeon: Rosetta Posner, MD;  Location: [facility];   Service: Vascular;  Laterality: Left;   CESAREAN SECTION     x2   COMPLEX WOUND CLOSURE Left 07/30/2020   Procedure: COMPLEX WOUND CLOSURE;  Surgeon: Cherre Robins, MD;  Location: [facility];  Service: Vascular;  Laterality: Left;   FISTULA SUPERFICIALIZATION Left 10/28/2017   Procedure: SUPERFICIALIZATION LEFT BRACHIOCEPHALIC ARTERIOVENOUS FISTULA;  Surgeon: Rosetta Posner, MD;  Location: [facility];  Service: Vascular;  Laterality: Left;   INGUINAL HERNIA REPAIR Bilateral 02/01/2021   Procedure: LAPAROSCOPIC BILATERAL INGUINAL HERNIA REPAIR WITH MESH;  Surgeon: Kinsinger, Arta Bruce, MD;  Location: [facility];  Service: General;  Laterality: Bilateral;   INSERTION OF DIALYSIS CATHETER Right 06/24/2017   Procedure: INSERTION OF TUNNELED DIALYSIS CATHETER;  Surgeon: Rosetta Posner, MD;  Location: [facility];  Service: Vascular;  Laterality: Right;   INSERTION OF DIALYSIS CATHETER Right 07/30/2020   Procedure: INSERTION OF DIALYSIS CATHETER;  Surgeon: Cherre Robins, MD;  Location: [facility];  Service: Vascular;  Laterality: Right;   LIGATION OF COMPETING BRANCHES OF ARTERIOVENOUS FISTULA  10/28/2017   Procedure: LIGATION OF  COMPETING BRANCHES OF ARTERIOVENOUS FISTULA x3;  Surgeon: Rosetta Posner, MD;  Location: [facility];  Service: Vascular;;   REVISON OF ARTERIOVENOUS FISTULA Left 07/30/2020   Procedure: LEFT UPPER EXTREMITY ARTERIOVENOUS FISTULA REVISON;  Surgeon: Cherre Robins, MD;  Location: [facility];  Service: Vascular;  Laterality: Left;  PERIPHERAL NERVE BLOCK    Social History:  Social History   Socioeconomic History   Marital status: Single    Spouse name: Not on file   Number of children: Not on file   Years of education: Not on file   Highest education level: Not on file  Occupational History   Not on file  Tobacco Use   Smoking status: Every Day    Packs/day: 0.25    Years: 15.00    Pack years: 3.75    Types: Cigarettes   Smokeless tobacco: Never  Vaping Use   Vaping Use: Never used  Substance and  Sexual Activity   Alcohol use: Not Currently    Comment: occassional beer   Drug use: Yes    Types: Marijuana   Sexual activity: Yes    Birth control/protection: None  Other Topics Concern   Not on file  Social History Narrative   Not on file   Social Determinants of Health   Financial Resource Strain: Not on file  Food Insecurity: Not on file  Transportation Needs: Not on file  Physical Activity: Not on file  Stress: Not on file  Social Connections: Not on file  Intimate Partner Violence: Not on file    Family History:  Family History  Problem Relation Age of Onset   Diabetes Father    Hypertension Father    Cancer Father    Heart disease Father    Aneurysm Mother    Seizures Mother     Medications:   Current Outpatient Medications on File Prior to Visit  Medication Sig Dispense Refill   acetaminophen (TYLENOL) 500 MG tablet Take 500-1,000 mg by mouth every 6 (six) hours as needed (for pain.).     amLODipine (NORVASC) 10 MG tablet Take 1 tablet (10 mg total) by mouth at bedtime. 30 tablet 0   B Complex-C-Folic Acid (DIALYVITE TABLET) TABS Take 1 tablet by mouth daily.     carvedilol (COREG) 25 MG tablet Take 1 tablet (25 mg total) by mouth 2 (two) times daily with a meal. 60 tablet 0   diphenhydrAMINE (BENADRYL) 25 MG tablet Take 25 mg by mouth every 6 (six) hours as needed for itching or sleep.     doxercalciferol (HECTOROL) 0.5 MCG capsule Doxercalciferol (Hectorol)     hydrALAZINE (APRESOLINE) 100 MG tablet Take 1 tablet (100 mg total) by mouth 3 (three) times daily. 90 tablet 0   levETIRAcetam (KEPPRA) 1000 MG tablet Take 1 tablet (1,000 mg total) by mouth 2 (two) times daily. (Patient taking differently: Take 1,000 mg by mouth in the morning.) 60 tablet 1   Methoxy PEG-Epoetin Beta (MIRCERA IJ) Mircera     oxyCODONE (OXY IR/ROXICODONE) 5 MG immediate release tablet Take 1 tablet (5 mg total) by mouth every 6 (six) hours as needed for severe pain. 20 tablet 0    sevelamer carbonate (RENVELA) 800 MG tablet Take 2 tablets (1,600 mg total) by mouth 3 (three) times daily with meals. (Patient taking differently: Take 1,600 mg by mouth See admin instructions. Take 2 tablets (1600 mg) by mouth 3 times daily with meals & take 1 tablet (800 mg) by mouth with snacks) 30 tablet 0  spironolactone (ALDACTONE) 50 MG tablet Take 1 tablet (50 mg total) by mouth daily. 30 tablet 0   Current Facility-Administered Medications on File Prior to Visit  Medication Dose Route Frequency Provider Last Rate Last Admin   sodium chloride flush (NS) 0.9 % injection 3 mL  3 mL Intravenous Q12H Cherre Robins, MD       sodium chloride flush (NS) 0.9 % injection 3 mL  3 mL Intravenous PRN Cherre Robins, MD        Allergies:   Allergies  Allergen Reactions   Visine [Tetrahydrozoline Hcl] Swelling    Eyes Swelling     Physical Exam  There were no vitals filed for this visit.  There is no height or weight on file to calculate BMI. No results found.   General: well developed, well nourished, very pleasant middle-aged African-American female, seated, in no evident distress Head: head normocephalic and atraumatic.   Neck: supple with no carotid or supraclavicular bruits; Cardiovascular: regular rate and rhythm, no murmurs; L arm fistula +bruit, +thrill Musculoskeletal: no deformity Skin:  no rash/petichiae Vascular:  Normal pulses all extremities   Neurologic Exam Mental Status: Awake and fully alert.  Fluent speech and language. Oriented to place and time. Recent and remote memory intact. Attention span, concentration and fund of knowledge appropriate. Mood and affect appropriate.  Cranial Nerves: Pupils equal, briskly reactive to light. Extraocular movements full without nystagmus. Visual fields full to confrontation. Hearing intact. Facial sensation intact. Face, tongue, palate moves normally and symmetrically.  Motor: Normal bulk and tone. Normal strength in all  tested extremity muscles. Sensory.: intact to touch , pinprick , position and vibratory sensation.  Coordination: Rapid alternating movements normal in all extremities. Finger-to-nose and heel-to-shin performed accurately bilaterally. Gait and Station: Arises from chair without difficulty. Stance is normal. Gait demonstrates normal stride length and balance Reflexes: 1+ and symmetric. Toes downgoing.          ASSESSMENT: Madeline Brown is a 45 y.o. 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 Vascular risk factors include HTN, HLD, tobacco use, EtOH use, THC use and ESRD on HD.  Has been stable from a neurological standpoint without residual deficits or reoccurring/new stroke/TIA symptoms.  Questionable breakthrough seizure 1 week prior after stressful event otherwise no additional seizure activity and ongoing compliance of Keppra 1000 mg daily.    PLAN:  ICH likely HTN etiology: Repeat MRI wo contrast due to end-stage renal function to assess for potential underlying etiology as recommended at hospital discharge.  Order previously placed but not completed for unknown reasons.  Avoid aspirin, aspirin-containing products and ibuprofen products due to [place].  No indication for antithrombotic as no prior history of stroke, CAD or stents.  Continue Lipitor 20 mg daily for secondary stroke prevention.  Advised refills will need to be provided by PCP.  Maintain strict control of hypertension with blood pressure goal below 130/90, diabetes with hemoglobin A1c goal below 6.5% and cholesterol with LDL cholesterol (bad cholesterol) goal below 70 mg/dL.  I also advised the patient to eat a healthy diet with plenty of whole grains, cereals, fruits and vegetables, exercise regularly with at least 30 minutes of continuous activity daily and maintain ideal body weight. Seizure,  post ICH: Continuation of Keppra 24hr tab 1000 mg daily - refill provided.  Discussion regarding avoidance of seizure provoking triggers or activities and is aware to  call office with any question of seizure activity or symptoms.  HTN: Baseline level per patient.  Highly encourage monitoring at home to ensure satisfactory levels and ongoing follow-up with PCP for monitoring management HLD: Continuation of atorvastatin and ongoing prescribing, monitoring and management through PCP    Follow up in 6 months or call earlier if needed    CC:  [facility] provider: Dr. Darden Dates, Malva Limes., FNP    I spent 26 minutes of face-to-face and non-face-to-face time with patient.  This included previsit chart review, lab review, study review, order entry, electronic health record documentation, patient education     Frann Rider, [facility]  [facility] [address] [place], [place] [address]  Phone [phone] Fax [phone] Note: This document was prepared with digital dictation and possible smart phrase technology. Any transcriptional errors that result from this process are unintentional.

## 2021-02-25 ENCOUNTER — Ambulatory Visit: Payer: Self-pay | Admitting: Adult Health

## 2021-03-22 ENCOUNTER — Telehealth: Payer: Self-pay | Admitting: Cardiovascular Disease

## 2021-03-22 NOTE — Telephone Encounter (Signed)
Spoke with pt and since Monday has noted chest heaviness SOB with walking 10 -12 feet feels exhausted  Also feels heart skipping around  pt also notes   this week after dialysis B/P elevated running 141/106 an 141/96 and pt  has noted stress this week as well Per pt symptoms resolves with rest Pt has appt  on 03/26/21 at 9:15 am

## 2021-03-22 NOTE — Telephone Encounter (Signed)
Patient c/o Palpitations:  High priority if patient c/o lightheadedness, shortness of breath, or chest pain  How long have you had palpitations/irregular HR/ Afib? Are you having the symptoms now? For about a week, no  Are you currently experiencing lightheadedness, SOB or CP? Chest heaviness, SOB on exertion   Do you have a history of afib (atrial fibrillation) or irregular heart rhythm? no  Have you checked your BP or HR? (document readings if available): yesterday 141/106, 141/96  Are you experiencing any other symptoms? No   Patient states she has been having palpitations and chest heaviness for about a week. She says she has also been getting SOB for the last 2 days when walking a short distance. She says her symptoms come and go and she is not currently having them but did have palpitations and chest heaviness this morning.  ?

## 2021-03-25 ENCOUNTER — Emergency Department (HOSPITAL_COMMUNITY): Payer: Medicare HMO

## 2021-03-25 ENCOUNTER — Inpatient Hospital Stay (HOSPITAL_COMMUNITY)
Admission: EM | Admit: 2021-03-25 | Discharge: 2021-03-27 | DRG: 640 | Disposition: A | Discharge status: Home / Self Care | Payer: Medicare HMO | Attending: Student in an Organized Health Care Education/Training Program | Admitting: Student in an Organized Health Care Education/Training Program

## 2021-03-25 ENCOUNTER — Other Ambulatory Visit: Payer: Self-pay

## 2021-03-25 ENCOUNTER — Encounter (HOSPITAL_COMMUNITY): Payer: Self-pay | Admitting: Internal Medicine

## 2021-03-25 DIAGNOSIS — N2581 Secondary hyperparathyroidism of renal origin: Secondary | ICD-10-CM | POA: Diagnosis present

## 2021-03-25 DIAGNOSIS — R0902 Hypoxemia: Secondary | ICD-10-CM | POA: Diagnosis not present

## 2021-03-25 DIAGNOSIS — Z833 Family history of diabetes mellitus: Secondary | ICD-10-CM

## 2021-03-25 DIAGNOSIS — J810 Acute pulmonary edema: Secondary | ICD-10-CM | POA: Diagnosis present

## 2021-03-25 DIAGNOSIS — R0602 Shortness of breath: Secondary | ICD-10-CM

## 2021-03-25 DIAGNOSIS — E8779 Other fluid overload: Secondary | ICD-10-CM

## 2021-03-25 DIAGNOSIS — Z8673 Personal history of transient ischemic attack (TIA), and cerebral infarction without residual deficits: Secondary | ICD-10-CM

## 2021-03-25 DIAGNOSIS — N186 End stage renal disease: Secondary | ICD-10-CM | POA: Diagnosis present

## 2021-03-25 DIAGNOSIS — J9601 Acute respiratory failure with hypoxia: Secondary | ICD-10-CM | POA: Diagnosis not present

## 2021-03-25 DIAGNOSIS — Z20822 Contact with and (suspected) exposure to covid-19: Secondary | ICD-10-CM | POA: Diagnosis present

## 2021-03-25 DIAGNOSIS — I120 Hypertensive chronic kidney disease with stage 5 chronic kidney disease or end stage renal disease: Secondary | ICD-10-CM | POA: Diagnosis present

## 2021-03-25 DIAGNOSIS — E8770 Fluid overload, unspecified: Principal | ICD-10-CM | POA: Diagnosis present

## 2021-03-25 DIAGNOSIS — Z992 Dependence on renal dialysis: Secondary | ICD-10-CM | POA: Diagnosis present

## 2021-03-25 DIAGNOSIS — D631 Anemia in chronic kidney disease: Secondary | ICD-10-CM | POA: Diagnosis present

## 2021-03-25 DIAGNOSIS — Z8249 Family history of ischemic heart disease and other diseases of the circulatory system: Secondary | ICD-10-CM

## 2021-03-25 DIAGNOSIS — F17210 Nicotine dependence, cigarettes, uncomplicated: Secondary | ICD-10-CM | POA: Diagnosis present

## 2021-03-25 DIAGNOSIS — E8889 Other specified metabolic disorders: Secondary | ICD-10-CM | POA: Diagnosis present

## 2021-03-25 DIAGNOSIS — G40909 Epilepsy, unspecified, not intractable, without status epilepticus: Secondary | ICD-10-CM | POA: Diagnosis present

## 2021-03-25 DIAGNOSIS — I10 Essential (primary) hypertension: Secondary | ICD-10-CM | POA: Diagnosis present

## 2021-03-25 DIAGNOSIS — Z888 Allergy status to other drugs, medicaments and biological substances status: Secondary | ICD-10-CM

## 2021-03-25 DIAGNOSIS — Z79899 Other long term (current) drug therapy: Secondary | ICD-10-CM

## 2021-03-25 DIAGNOSIS — D509 Iron deficiency anemia, unspecified: Secondary | ICD-10-CM | POA: Diagnosis present

## 2021-03-25 LAB — RETICULOCYTES
Immature Retic Fract: 18.5 % — ABNORMAL HIGH (ref 2.3–15.9)
RBC.: 2.63 MIL/uL — ABNORMAL LOW (ref 3.87–5.11)
Retic Count, Absolute: 143.3 10*3/uL (ref 19.0–186.0)
Retic Ct Pct: 5.5 % — ABNORMAL HIGH (ref 0.4–3.1)

## 2021-03-25 LAB — COMPREHENSIVE METABOLIC PANEL
ALT: 9 U/L (ref 0–44)
AST: 16 U/L (ref 15–41)
Albumin: 3.6 g/dL (ref 3.5–5.0)
Alkaline Phosphatase: 32 U/L — ABNORMAL LOW (ref 38–126)
Anion gap: 12 (ref 5–15)
BUN: 45 mg/dL — ABNORMAL HIGH (ref 6–20)
CO2: 26 mmol/L (ref 22–32)
Calcium: 9.6 mg/dL (ref 8.9–10.3)
Chloride: 100 mmol/L (ref 98–111)
Creatinine, Ser: 8.44 mg/dL — ABNORMAL HIGH (ref 0.44–1.00)
GFR, Estimated: 6 mL/min — ABNORMAL LOW (ref >60)
Glucose, Bld: 101 mg/dL — ABNORMAL HIGH (ref 70–99)
Potassium: 3.4 mmol/L — ABNORMAL LOW (ref 3.5–5.1)
Sodium: 138 mmol/L (ref 135–145)
Total Bilirubin: 1.6 mg/dL — ABNORMAL HIGH (ref 0.3–1.2)
Total Protein: 6.9 g/dL (ref 6.5–8.1)

## 2021-03-25 LAB — CBC WITH DIFFERENTIAL/PLATELET
Abs Immature Granulocytes: 0.02 10*3/uL (ref 0.00–0.07)
Basophils Absolute: 0 10*3/uL (ref 0.0–0.1)
Basophils Relative: 1 %
Eosinophils Absolute: 0.2 10*3/uL (ref 0.0–0.5)
Eosinophils Relative: 4 %
HCT: 26.2 % — ABNORMAL LOW (ref 36.0–46.0)
Hemoglobin: 8.5 g/dL — ABNORMAL LOW (ref 12.0–15.0)
Immature Granulocytes: 1 %
Lymphocytes Relative: 17 %
Lymphs Abs: 0.6 10*3/uL — ABNORMAL LOW (ref 0.7–4.0)
MCH: 34.3 pg — ABNORMAL HIGH (ref 26.0–34.0)
MCHC: 32.4 g/dL (ref 30.0–36.0)
MCV: 105.6 fL — ABNORMAL HIGH (ref 80.0–100.0)
Monocytes Absolute: 0.3 10*3/uL (ref 0.1–1.0)
Monocytes Relative: 8 %
Neutro Abs: 2.5 10*3/uL (ref 1.7–7.7)
Neutrophils Relative %: 69 %
Platelets: 73 10*3/uL — ABNORMAL LOW (ref 150–400)
RBC: 2.48 MIL/uL — ABNORMAL LOW (ref 3.87–5.11)
RDW: 15.9 % — ABNORMAL HIGH (ref 11.5–15.5)
WBC: 3.7 10*3/uL — ABNORMAL LOW (ref 4.0–10.5)
nRBC: 0 % (ref 0.0–0.2)

## 2021-03-25 LAB — IRON AND TIBC
Iron: 47 ug/dL (ref 28–170)
Saturation Ratios: 18 % (ref 10.4–31.8)
TIBC: 260 ug/dL (ref 250–450)
UIBC: 213 ug/dL

## 2021-03-25 LAB — VITAMIN B12: Vitamin B-12: 1100 pg/mL — ABNORMAL HIGH (ref 180–914)

## 2021-03-25 LAB — RESP PANEL BY RT-PCR (FLU A&B, COVID) ARPGX2
Influenza A by PCR: NEGATIVE
Influenza B by PCR: NEGATIVE
SARS Coronavirus 2 by RT PCR: NEGATIVE

## 2021-03-25 LAB — BRAIN NATRIURETIC PEPTIDE: B Natriuretic Peptide: 658.6 pg/mL — ABNORMAL HIGH (ref 0.0–100.0)

## 2021-03-25 LAB — FOLATE: Folate: 10.7 ng/mL (ref >5.9)

## 2021-03-25 LAB — FERRITIN: Ferritin: 1238 ng/mL — ABNORMAL HIGH (ref 11–307)

## 2021-03-25 MED ORDER — DIPHENHYDRAMINE HCL 25 MG PO CAPS: 25.0000 mg | ORAL_CAPSULE | Freq: Four times a day (QID) | ORAL | Status: DC | PRN

## 2021-03-25 MED ORDER — LEVETIRACETAM 500 MG PO TABS
1000.0000 mg | ORAL_TABLET | Freq: Every morning | ORAL | Status: DC
  Administered 2021-03-26 – 2021-03-27 (×2): 1000 mg via ORAL
  Filled 2021-03-25 (×2): qty 2

## 2021-03-25 MED ORDER — SEVELAMER CARBONATE 800 MG PO TABS
1600.0000 mg | ORAL_TABLET | Freq: Three times a day (TID) | ORAL | Status: DC
  Administered 2021-03-26 – 2021-03-27 (×4): 1600 mg via ORAL
  Filled 2021-03-25 (×6): qty 2

## 2021-03-25 MED ORDER — CARVEDILOL 25 MG PO TABS
25.0000 mg | ORAL_TABLET | Freq: Two times a day (BID) | ORAL | Status: DC
  Administered 2021-03-26 – 2021-03-27 (×2): 25 mg via ORAL
  Filled 2021-03-25: qty 1
  Filled 2021-03-25: qty 8
  Filled 2021-03-25: qty 1

## 2021-03-25 MED ORDER — SEVELAMER CARBONATE 800 MG PO TABS: 800.0000 mg | ORAL_TABLET | ORAL | Status: DC

## 2021-03-25 MED ORDER — AMLODIPINE BESYLATE 10 MG PO TABS
10.0000 mg | ORAL_TABLET | Freq: Every day | ORAL | Status: DC
  Administered 2021-03-25 – 2021-03-26 (×2): 10 mg via ORAL
  Filled 2021-03-25: qty 2
  Filled 2021-03-25: qty 1

## 2021-03-25 MED ORDER — ACETAMINOPHEN 325 MG PO TABS: 650.0000 mg | ORAL_TABLET | Freq: Four times a day (QID) | ORAL | Status: DC | PRN

## 2021-03-25 MED ORDER — SPIRONOLACTONE 25 MG PO TABS
50.0000 mg | ORAL_TABLET | Freq: Every day | ORAL | Status: DC
  Administered 2021-03-26 – 2021-03-27 (×2): 50 mg via ORAL
  Filled 2021-03-25 (×3): qty 2

## 2021-03-25 MED ORDER — HEPARIN SODIUM (PORCINE) 5000 UNIT/ML IJ SOLN
5000.0000 [IU] | Freq: Three times a day (TID) | INTRAMUSCULAR | Status: DC
  Administered 2021-03-25 – 2021-03-27 (×5): 5000 [IU] via SUBCUTANEOUS
  Filled 2021-03-25 (×5): qty 1

## 2021-03-25 MED ORDER — HYDRALAZINE HCL 50 MG PO TABS
100.0000 mg | ORAL_TABLET | Freq: Three times a day (TID) | ORAL | Status: DC
  Administered 2021-03-25 – 2021-03-27 (×4): 100 mg via ORAL
  Filled 2021-03-25 (×4): qty 2

## 2021-03-25 MED ORDER — SODIUM CHLORIDE 0.9% FLUSH
3.0000 mL | Freq: Two times a day (BID) | INTRAVENOUS | Status: DC
  Administered 2021-03-25 – 2021-03-27 (×4): 3 mL via INTRAVENOUS

## 2021-03-25 NOTE — ED Triage Notes (Signed)
Pt's last dialysis was Sat , almost to dry weight. No SOB while taking dialysis

## 2021-03-25 NOTE — ED Provider Notes (Signed)
Emergency Department Provider Note   I have reviewed the triage vital signs and the nursing notes.   HISTORY  Chief Complaint Shortness of Breath   HPI Madeline Brown is a 45 y.o. female with past medical history reviewed below including end-stage renal disease, anemia of chronic disease, hypertension, and prior seizure presents to the emergency department with exertional dyspnea worsening over the past week.  She is not having pain or tightness in the chest.  She is feeling short of breath mainly with exertion and has recorded some low oxygen saturations at home from time to time as low as the 70s.  She not having fevers, chills, upper respiratory infection symptoms.  She is not having vomiting or diarrhea.  No radiation of symptoms or other modifying factors.  Her last dialysis was Saturday.  During that session, she asked to be taken slightly below her dry weight, which was done, but she continues to feel symptoms.  She denies any bleeding in her bowel movements or vaginal bleeding.  She continues with her home medications.    Past Medical History:  Diagnosis Date   Anemia    Chronic kidney disease    Headache, unspecified 09/26/2017   History of blood transfusion 07/2020   Hypertension    Seizure ([place]) 02/2019   Shortness of breath 07/15/2018   01/16/21 not currently   Stroke [facility])    Mild was told after having a seizure    Patient Active Problem List   Diagnosis Date Noted   Fluid overload 65/68/1275   Complication of vascular dialysis catheter 09/17/2020   Right lower lobe pneumonia 08/07/2020   Other mechanical complication of surgically created arteriovenous fistula, subsequent encounter 07/25/2020   Fluid overload, unspecified 12/09/2019   Acute respiratory failure with hypoxia ([place]) 12/08/2019   Hypertensive urgency 12/07/2019   A-V fistula ([place]) 11/28/2019   Current smoker 11/28/2019   History of intracranial hemorrhage 11/28/2019   Transplanted organ and tissue  status, 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/15/2019   Intracerebral hemorrhage 02/21/2019   Intracranial hematoma 02/20/2019   Seizure ([place]) 02/19/2019   Pain, unspecified 08/23/2018   Shortness of breath 07/15/2018   Pruritus, unspecified 10/28/2017   Headache, unspecified 09/26/2017   Anemia in chronic kidney disease 06/30/2017   Coagulation defect, unspecified ([place]) 06/30/2017   Hypertensive chronic kidney disease with stage 1 through stage 4 chronic kidney disease, or unspecified chronic kidney disease 06/30/2017   Iron deficiency anemia, unspecified 06/30/2017   Encounter for fitting and adjustment of extracorporeal dialysis catheter ([place]) 06/30/2017   Secondary hyperparathyroidism of renal origin ([place]) 06/30/2017   ESRD (end stage renal disease) ([place]) 06/24/2017   Hypertension 03/23/2017   Anemia 02/12/2017    Past Surgical History:  Procedure Laterality Date   AV FISTULA PLACEMENT Left 06/24/2017   Procedure: CREATION OF LEFT ARM BRACHIALCEPHALIC  ARTERIOVENOUS (AV) FISTULA;  Surgeon: Rosetta Posner, MD;  Location: [place];  Service: Vascular;  Laterality: Left;   CESAREAN SECTION     x2   COMPLEX WOUND CLOSURE Left 07/30/2020   Procedure: COMPLEX WOUND CLOSURE;  Surgeon: Cherre Robins, MD;  Location: [place];  Service: Vascular;  Laterality: Left;   FISTULA SUPERFICIALIZATION Left 10/28/2017   Procedure: SUPERFICIALIZATION LEFT BRACHIOCEPHALIC ARTERIOVENOUS FISTULA;  Surgeon: Rosetta Posner, MD;  Location: [place];  Service: Vascular;  Laterality: Left;   INGUINAL HERNIA REPAIR Bilateral 02/01/2021   Procedure: LAPAROSCOPIC BILATERAL INGUINAL HERNIA REPAIR WITH MESH;  Surgeon: Kinsinger, Arta Bruce, MD;  Location: [facility];  Service:  General;  Laterality: Bilateral;   INSERTION OF DIALYSIS CATHETER Right 06/24/2017   Procedure: INSERTION OF TUNNELED DIALYSIS CATHETER;  Surgeon: Rosetta Posner, MD;  Location: [facility];  Service: Vascular;  Laterality: Right;   INSERTION OF DIALYSIS CATHETER Right 07/30/2020    Procedure: INSERTION OF DIALYSIS CATHETER;  Surgeon: Cherre Robins, MD;  Location: [facility];  Service: Vascular;  Laterality: Right;   LIGATION OF COMPETING BRANCHES OF ARTERIOVENOUS FISTULA  10/28/2017   Procedure: LIGATION OF COMPETING BRANCHES OF ARTERIOVENOUS FISTULA x3;  Surgeon: Rosetta Posner, MD;  Location: [facility];  Service: Vascular;;   REVISON OF ARTERIOVENOUS FISTULA Left 07/30/2020   Procedure: LEFT UPPER EXTREMITY ARTERIOVENOUS FISTULA REVISON;  Surgeon: Cherre Robins, MD;  Location: [facility];  Service: Vascular;  Laterality: Left;  PERIPHERAL NERVE BLOCK    Allergies Visine [tetrahydrozoline hcl]  Family History  Problem Relation Age of Onset   Diabetes Father    Hypertension Father    Cancer Father    Heart disease Father    Aneurysm Mother    Seizures Mother     Social History Social History   Tobacco Use   Smoking status: Every Day    Packs/day: 0.25    Years: 15.00    Pack years: 3.75    Types: Cigarettes   Smokeless tobacco: Never  Vaping Use   Vaping Use: Never used  Substance Use Topics   Alcohol use: Not Currently    Comment: occassional beer   Drug use: Yes    Types: Marijuana    Review of Systems  Constitutional: No fever/chills Eyes: No visual changes. ENT: No sore throat. Cardiovascular: Denies chest pain. Respiratory: Positive shortness of breath mainly with exertion.  Gastrointestinal: No abdominal pain.  No nausea, no vomiting.  No diarrhea.  No constipation. Genitourinary: Negative for dysuria. Musculoskeletal: Negative for back pain. Skin: Negative for rash. Neurological: Negative for headaches, focal weakness or numbness.  10-point ROS otherwise negative.  ____________________________________________   PHYSICAL EXAM:  VITAL SIGNS: ED Triage Vitals  Enc Vitals Group     BP 03/25/21 0737 (!) 159/99     Pulse Rate 03/25/21 0737 87     Resp 03/25/21 0737 17     Temp 03/25/21 0737 97.9 F (36.6 C)     Temp Source 03/25/21 0737  Oral     SpO2 03/25/21 0737 100 %     Weight 03/25/21 0733 121 lb 4.1 oz (55 kg)     Height 03/25/21 0733 5' (1.524 m)    Constitutional: Alert and oriented. Well appearing and in no acute distress. Eyes: Conjunctivae are normal.  Head: Atraumatic. Nose: No congestion/rhinnorhea. Mouth/Throat: Mucous membranes are moist.   Neck: No stridor.   Cardiovascular: Normal rate, regular rhythm. Good peripheral circulation. Grossly normal heart sounds.   Respiratory: Normal respiratory effort.  No retractions. Lungs CTAB. Gastrointestinal: Soft and nontender. No distention.  Musculoskeletal: No lower extremity tenderness nor edema. No gross deformities of extremities. Neurologic:  Normal speech and language. No gross focal neurologic deficits are appreciated.  Skin:  Skin is warm, dry and intact. No rash noted.   ____________________________________________   LABS (all labs ordered are listed, but only abnormal results are displayed)  Labs Reviewed  BRAIN NATRIURETIC PEPTIDE - Abnormal; Notable for the following components:      Result Value   B Natriuretic Peptide 658.6 (*)    All other components within normal limits  CBC WITH DIFFERENTIAL/PLATELET - Abnormal; Notable for the following components:  WBC 3.7 (*)    RBC 2.48 (*)    Hemoglobin 8.5 (*)    HCT 26.2 (*)    MCV 105.6 (*)    MCH 34.3 (*)    RDW 15.9 (*)    Platelets 73 (*)    Lymphs Abs 0.6 (*)    All other components within normal limits  COMPREHENSIVE METABOLIC PANEL - Abnormal; Notable for the following components:   Potassium 3.4 (*)    Glucose, Bld 101 (*)    BUN 45 (*)    Creatinine, Ser 8.44 (*)    Alkaline Phosphatase 32 (*)    Total Bilirubin 1.6 (*)    GFR, Estimated 6 (*)    All other components within normal limits  VITAMIN B12 - Abnormal; Notable for the following components:   Vitamin B-12 1,100 (*)    All other components within normal limits  FERRITIN - Abnormal; Notable for the following  components:   Ferritin 1,238 (*)    All other components within normal limits  RETICULOCYTES - Abnormal; Notable for the following components:   Retic Ct Pct 5.5 (*)    RBC. 2.63 (*)    Immature Retic Fract 18.5 (*)    All other components within normal limits  RESP PANEL BY RT-PCR (FLU A&B, COVID) ARPGX2  FOLATE  IRON AND TIBC   ____________________________________________  EKG   EKG Interpretation  Date/Time:  Monday March 25 2021 07:38:29 EDT Ventricular Rate:  85 PR Interval:  204 QRS Duration: 80 QT Interval:  368 QTC Calculation: 437 R Axis:   93 Text Interpretation: Normal sinus rhythm Left atrial enlargement Rightward axis Anterior infarct , age undetermined Abnormal ECG Confirmed by Nanda Quinton [phone]) on 03/25/2021 1:42:49 PM        ____________________________________________  RADIOLOGY  DG Chest Portable 1 View  Result Date: 03/25/2021 CLINICAL DATA:  SOB EXAM: PORTABLE CHEST 1 VIEW COMPARISON:  Multiple priors, most recent August 07, 2020. FINDINGS: Enlarged cardiac silhouette. Probable small bilateral pleural effusions. No visible pneumothorax. Streaky bibasilar opacities. Mild diffuse interstitial prominence. No acute osseous abnormality. IMPRESSION: 1. Cardiomegaly with small bilateral pleural effusions. Mild diffuse interstitial prominence, which could represent the sequela of recurrent bouts of CHF or mild interstitial edema. 2. Mild bibasilar opacities, most likely atelectasis. Pneumonia is not excluded. Electronically Signed   By: Margaretha Sheffield M.D.   On: 03/25/2021 12:49    ____________________________________________   PROCEDURES  Procedure(s) performed:   Procedures  CRITICAL CARE Performed by: Margette Fast Total critical care time: 35 minutes Critical care time was exclusive of separately billable procedures and treating other patients. Critical care was necessary to treat or prevent imminent or life-threatening  deterioration. Critical care was time spent personally by me on the following activities: development of treatment plan with patient and/or surrogate as well as nursing, discussions with consultants, evaluation of patient's response to treatment, examination of patient, obtaining history from patient or surrogate, ordering and performing treatments and interventions, ordering and review of laboratory studies, ordering and review of radiographic studies, pulse oximetry and re-evaluation of patient's condition.  Nanda Quinton, MD Emergency Medicine  ____________________________________________   INITIAL IMPRESSION / ASSESSMENT AND PLAN / ED COURSE  Pertinent labs & imaging results that were available during my care of the patient were reviewed by me and considered in my medical decision making (see chart for details).   Zentz to the emergency department with exertional dyspnea over the past week.  No chest pain, infection symptoms, or heart  palpitations.  Patient remains compliant with her dialysis.  Last session was Saturday.  She looks well but during the ED course developed hypoxemia as low as 82%.  She was placed on nasal cannula oxygen with improvement in saturations.   COVID and flu are negative.  Patient does have anemia.  This is described in prior medical encounters with known history of anemia of chronic disease/ESRD.  This may be contributing to some of her shortness of breath symptoms but does not meet threshold for transfusion as of yet.  Chest x-ray showing some possible mild edema.  Pneumonia not high on my differential.   Discussed patient's case with [facility] to request admission. Patient and family (if present) updated with plan. Care transferred to [facility] service.  I reviewed all nursing notes, vitals, pertinent old records, EKGs, labs, imaging (as available).  ____________________________________________  FINAL CLINICAL IMPRESSION(S) / ED DIAGNOSES  Final diagnoses:  Hypoxemia  SOB  (shortness of breath)    Note:  This document was prepared using Dragon voice recognition software and may include unintentional dictation errors.  Nanda Quinton, MD, [facility] Emergency Medicine    Jaimie Redditt, [facility], MD 03/25/21 [phone]

## 2021-03-25 NOTE — ED Notes (Signed)
This RN went in to check on the pt. Pt's oxygen saturation was 83% with a good wave form. This RN instructed pt to take a couple deep breaths through her nose and out her mouth. Pt's oxygen saturation was still only 88-89%. This RN placed pt on 2L [place]. Pt is now sating 96%. Will continue to monitor.

## 2021-03-25 NOTE — ED Triage Notes (Signed)
Pt. Stated, Ive had SOB off and on for about a week. No other symptoms

## 2021-03-25 NOTE — ED Notes (Addendum)
Pt alert, NAD, calm, interactive, resps e/u, speaking in clear complete sentences, VSS, skin W&D, denies pain, sob, or nausea. "Feel better, feel relatively normal". HD T, TH, S, last HD Saturday, access L upper arm unremarkable, +bruit/ thrill. Family at [facility].

## 2021-03-25 NOTE — H&P (Signed)
History and Physical    Madeline Brown RKY:[id number] DOB: December 27, 1975 DOA: 03/25/2021  PCP: Sonia Side., FNP  Patient coming from: Home  I have personally briefly reviewed patient's old medical records available.   Chief Complaint: Shortness of breath for 1 week  HPI: Madeline Brown is a 45 y.o. female with medical history significant of hypertension, ESRD on hemodialysis TTS schedule who presents to the emergency room with gradually progressive shortness of breath for about 1 week.  As per patient, she had no other changes in her medication regimen or dialysis regimen.  She has been going to dialysis as scheduled.  About 1 week she started noticing that every time she gets up and walks she gets short of breath.  Last Friday, she could not even go to the bathroom.  She was checking her oxygen level at home and it would read anywhere between 75-80 on mobility.  Denies any fever, cough or congestion.  She does have vague chest discomfort with shortness of breath but denies any chest pain.  Denies any weight gain or leg swelling.  Does have some level of orthopnea which is worse than usual.  She is very consistent on hemodialysis. Patient is active.  She recently had inguinal hernia repair but has been mobile since then.  Denies any leg pain, calf swelling or calf pain.  Today, she measured her oxygen that was 80 so came to the hospital.  She requested her dialysis provider on Saturday to take some extra fluid off but she is not sure it happened. When patient had left upper extremity fistula created, she became ventilator dependent and later on went home on oxygen but improved in 2 days and has not used any oxygen at home since then. ED Course: Hemodynamically stable.  Blood pressure stable.  Patient 82% on room air needing 2 L of oxygen.  Chest x-ray with bilateral congestion and small pleural effusions.  EKG is nonischemic.  Due to new oxygen requirement, was advised monitoring in the hospital.     Review of Systems: all systems are reviewed and pertinent positive as per HPI otherwise rest are negative.    Past Medical History:  Diagnosis Date   Anemia    Chronic kidney disease    Headache, unspecified 09/26/2017   History of blood transfusion 07/2020   Hypertension    Seizure ([place]) 02/2019   Shortness of breath 07/15/2018   01/16/21 not currently   Stroke [facility])    Mild was told after having a seizure    Past Surgical History:  Procedure Laterality Date   AV FISTULA PLACEMENT Left 06/24/2017   Procedure: CREATION OF LEFT ARM BRACHIALCEPHALIC  ARTERIOVENOUS (AV) FISTULA;  Surgeon: Rosetta Posner, MD;  Location: [place];  Service: Vascular;  Laterality: Left;   CESAREAN SECTION     x2   COMPLEX WOUND CLOSURE Left 07/30/2020   Procedure: COMPLEX WOUND CLOSURE;  Surgeon: Cherre Robins, MD;  Location: [place];  Service: Vascular;  Laterality: Left;   FISTULA SUPERFICIALIZATION Left 10/28/2017   Procedure: SUPERFICIALIZATION LEFT BRACHIOCEPHALIC ARTERIOVENOUS FISTULA;  Surgeon: Rosetta Posner, MD;  Location: [place];  Service: Vascular;  Laterality: Left;   INGUINAL HERNIA REPAIR Bilateral 02/01/2021   Procedure: LAPAROSCOPIC BILATERAL INGUINAL HERNIA REPAIR WITH MESH;  Surgeon: Kinsinger, Arta Bruce, MD;  [facility];  Service: General;  Laterality: Bilateral;   INSERTION OF DIALYSIS CATHETER Right 06/24/2017   Procedure: INSERTION OF TUNNELED DIALYSIS CATHETER;  Surgeon: Rosetta Posner, MD;  [facility];  Service: Vascular;  Laterality: Right;   INSERTION OF DIALYSIS CATHETER Right 07/30/2020   Procedure: INSERTION OF DIALYSIS CATHETER;  Surgeon: Cherre Robins, MD;  [facility];  Service: Vascular;  Laterality: Right;   LIGATION OF COMPETING BRANCHES OF ARTERIOVENOUS FISTULA  10/28/2017   Procedure: LIGATION OF COMPETING BRANCHES OF ARTERIOVENOUS FISTULA x3;  Surgeon: Rosetta Posner, MD;  Location: [place];  Service: Vascular;;   REVISON OF ARTERIOVENOUS FISTULA Left 07/30/2020    Procedure: LEFT UPPER EXTREMITY ARTERIOVENOUS FISTULA REVISON;  Surgeon: Cherre Robins, MD;  Location: [place];  Service: Vascular;  Laterality: Left;  PERIPHERAL NERVE BLOCK    Social history   reports that she has been smoking cigarettes. She has a 3.75 pack-year smoking history. She has never used smokeless tobacco. She reports that she does not currently use alcohol. She reports current drug use. Drug: Marijuana.  Allergies  Allergen Reactions   Visine [Tetrahydrozoline Hcl] Swelling    Eyes Swelling    Family History  Problem Relation Age of Onset   Diabetes Father    Hypertension Father    Cancer Father    Heart disease Father    Aneurysm Mother    Seizures Mother      Prior to Admission medications   Medication Sig Start Date End Date Taking? Authorizing Provider  acetaminophen (TYLENOL) 325 MG tablet Take 650 mg by mouth every 6 (six) hours as needed for mild pain or moderate pain (for dialysis treatment).   Yes [provider]  amLODipine (NORVASC) 10 MG tablet Take 1 tablet (10 mg total) by mouth at bedtime. 12/09/19 01/29/22 Yes Aslam, Loralyn Freshwater, MD  B Complex-C-Folic Acid (DIALYVITE TABLET) TABS Take 1 tablet by mouth daily. 09/27/19  Yes [provider]  carvedilol (COREG) 25 MG tablet Take 1 tablet (25 mg total) by mouth 2 (two) times daily with a meal. Patient taking differently: Take 25 mg by mouth 2 (two) times daily with a meal. T Th Sat 1100     2000 M W F Sun 0600   1800 12/09/19 01/29/22 Yes Aslam, Loralyn Freshwater, MD  Cinacalcet HCl (SENSIPAR PO) Take 1 capsule by mouth See admin instructions. Take at end of dialysis 12/13/20 12/12/21 Yes [provider]  diphenhydrAMINE (BENADRYL) 25 MG tablet Take 25 mg by mouth every 6 (six) hours as needed for itching or sleep (for dialysis days).   Yes [provider]  hydrALAZINE (APRESOLINE) 100 MG tablet Take 1 tablet (100 mg total) by mouth 3 (three) times daily. 12/09/19 01/30/23 Yes Aslam, Loralyn Freshwater, MD   sevelamer carbonate (RENVELA) 800 MG tablet Take 2 tablets (1,600 mg total) by mouth 3 (three) times daily with meals. Patient taking differently: Take 1,600 mg by mouth See admin instructions. Take 2 tablets (1600 mg) by mouth 3 times daily with meals & take 1 tablet (800 mg) by mouth with snacks 02/21/19  Yes Madalyn Rob, MD  spironolactone (ALDACTONE) 50 MG tablet Take 1 tablet (50 mg total) by mouth daily. 12/09/19  Yes Aslam, Loralyn Freshwater, MD  acetaminophen (TYLENOL) 500 MG tablet Take 500-1,000 mg by mouth every 6 (six) hours as needed (for pain.).    [provider]  doxercalciferol (HECTOROL) 0.5 MCG capsule Doxercalciferol (Hectorol) 08/07/20 08/06/21  [provider]  levETIRAcetam (KEPPRA) 1000 MG tablet Take 1 tablet (1,000 mg total) by mouth 2 (two) times daily. Patient taking differently: Take 1,000 mg by mouth in the morning. 12/07/20   Penumalli, Earlean Polka, MD  Methoxy PEG-Epoetin Beta (MIRCERA IJ) Mircera 08/14/20 08/13/21  [provider]  oxyCODONE (OXY IR/ROXICODONE) 5 MG immediate release tablet Take 1 tablet (5 mg total) by mouth every 6 (six) hours as needed for severe pain. Patient not taking: Reported on 03/25/2021 02/01/21   Kinsinger, Arta Bruce, MD    Physical Exam: Vitals:   03/25/21 1415 03/25/21 1430 03/25/21 1445 03/25/21 1500  BP: (!) 144/87 134/86 (!) 142/87   Pulse: 87 79 90 91  Resp: (!) 23 (!) 33 (!) 30 13  Temp:      TempSrc:      SpO2: 90% (!) 85% (!) 82% 96%  Weight:      Height:        Constitutional: NAD, calm, comfortable Vitals:   03/25/21 1415 03/25/21 1430 03/25/21 1445 03/25/21 1500  BP: (!) 144/87 134/86 (!) 142/87   Pulse: 87 79 90 91  Resp: (!) 23 (!) 33 (!) 30 13  Temp:      TempSrc:      SpO2: 90% (!) 85% (!) 82% 96%  Weight:      Height:       Eyes: PERRL, lids and conjunctivae normal ENMT: Mucous membranes are moist. Posterior pharynx clear of any exudate or lesions.Normal dentition.  Neck: normal, supple, no  masses, no thyromegaly Respiratory: clear to auscultation bilaterally, no wheezing, no crackles. Normal respiratory effort. No accessory muscle use.  Patient looks fairly stable on 2 L oxygen.  No added sounds. Cardiovascular: Regular rate and rhythm, no murmurs / rubs / gallops. No extremity edema. 2+ pedal pulses. No carotid bruits.  She has left upper extremity AV fistula with thrill. Abdomen: no tenderness, no masses palpated. No hepatosplenomegaly. Bowel sounds positive.  Musculoskeletal: no clubbing / cyanosis. No joint deformity upper and lower extremities. Good ROM, no contractures. Normal muscle tone.  Skin: no rashes, lesions, ulcers. No induration Neurologic: CN 2-12 grossly intact. Sensation intact, DTR normal. Strength 5/5 in all 4.  Psychiatric: Normal judgment and insight. Alert and oriented x 3. Normal mood.     Labs on Admission: I have personally reviewed following labs and imaging studies  CBC: Recent Labs  Lab 03/25/21 0742  WBC 3.7*  NEUTROABS 2.5  HGB 8.5*  HCT 26.2*  MCV 105.6*  PLT 73*   Basic Metabolic Panel: Recent Labs  Lab 03/25/21 1330  NA 138  K 3.4*  CL 100  CO2 26  GLUCOSE 101*  BUN 45*  CREATININE 8.44*  CALCIUM 9.6   GFR: Estimated Creatinine Clearance: 6.6 mL/min (A) (by C-G formula based on SCr of 8.44 mg/dL (H)). Liver Function Tests: Recent Labs  Lab 03/25/21 1330  AST 16  ALT 9  ALKPHOS 32*  BILITOT 1.6*  PROT 6.9  ALBUMIN 3.6   No results for input(s): LIPASE, AMYLASE in the last 168 hours. No results for input(s): AMMONIA in the last 168 hours. Coagulation Profile: No results for input(s): INR, PROTIME in the last 168 hours. Cardiac Enzymes: No results for input(s): CKTOTAL, CKMB, CKMBINDEX, TROPONINI in the last 168 hours. BNP (last 3 results) No results for input(s): PROBNP in the last 8760 hours. HbA1C: No results for input(s): HGBA1C in the last 72 hours. CBG: No results for input(s): GLUCAP in the last 168  hours. Lipid Profile: No results for input(s): CHOL, HDL, LDLCALC, TRIG, CHOLHDL, LDLDIRECT in the last 72 hours. Thyroid Function Tests: No results for input(s): TSH, T4TOTAL, FREET4, T3FREE, THYROIDAB in the last 72 hours. Anemia Panel: Recent Labs  03/25/21 1330  VITAMINB12 1,100*  FOLATE 10.7  FERRITIN 1,238*  TIBC 260  IRON 47  RETICCTPCT 5.5*   Urine analysis:    Component Value Date/Time   COLORURINE YELLOW 02/19/2019 1545   APPEARANCEUR CLOUDY (A) 02/19/2019 1545   LABSPEC 1.011 02/19/2019 1545   PHURINE 9.0 (H) 02/19/2019 1545   GLUCOSEU NEGATIVE 02/19/2019 1545   HGBUR NEGATIVE 02/19/2019 1545   [place] 02/19/2019 1545   KETONESUR NEGATIVE 02/19/2019 1545   PROTEINUR >=300 (A) 02/19/2019 1545   UROBILINOGEN 0.2 06/17/2016 1257   NITRITE NEGATIVE 02/19/2019 1545   LEUKOCYTESUR NEGATIVE 02/19/2019 1545    Radiological Exams on Admission: DG Chest Portable 1 View  Result Date: 03/25/2021 CLINICAL DATA:  SOB EXAM: PORTABLE CHEST 1 VIEW COMPARISON:  Multiple priors, most recent August 07, 2020. FINDINGS: Enlarged cardiac silhouette. Probable small bilateral pleural effusions. No visible pneumothorax. Streaky bibasilar opacities. Mild diffuse interstitial prominence. No acute osseous abnormality. IMPRESSION: 1. Cardiomegaly with small bilateral pleural effusions. Mild diffuse interstitial prominence, which could represent the sequela of recurrent bouts of CHF or mild interstitial edema. 2. Mild bibasilar opacities, most likely atelectasis. Pneumonia is not excluded. Electronically Signed   By: Margaretha Sheffield M.D.   On: 03/25/2021 12:49    EKG: Independently reviewed.  Sinus rhythm.  No acute ST-T wave changes.  Assessment/Plan Principal Problem:   Fluid overload Active Problems:   Hypertension   ESRD (end stage renal disease) (HCC)   Acute respiratory failure with hypoxia (HCC)   Iron deficiency anemia, unspecified     1.  Acute hypoxemic  respite failure secondary to suspected fluid overload from ineffective hemodialysis.  ESRD on hemodialysis. -Her symptomatology is probably due to ineffective fluid removal on dialysis.  Will discuss with nephrology about further fluid removal during her routine dialysis.  No indication for urgent dialysis today. -We will check 2D echocardiogram to assess her ejection fraction to rule out any new onset heart failure. -Check D-dimer, if significantly elevated, will order CT angiogram to rule out any pulmonary embolism. May need to go home on oxygen.  2.  ESRD on hemodialysis: ER physician notified nephrology for dialysis and fluid removal needs.  3.  Chronic iron deficiency anemia: Stable.  This may be contributing to her shortness of breath.  May need to be corrected with Procrit/IV iron.  Will defer to nephrology.  4.  Essential hypertension: Blood pressures fairly stable.  Resume home medications including beta-blockers and hydralazine.  She is also on Aldactone.  5.  Seizure disorder: Stable on Keppra.   DVT prophylaxis: Heparin subcu Code Status: Full code Family Communication: None Disposition Plan: Home when stable Consults called: Nephrology notified by ER Admission status: Telemetry.  Observation.   Barb Merino MD [facility] Pager [phone]

## 2021-03-26 ENCOUNTER — Observation Stay (HOSPITAL_COMMUNITY): Payer: Medicare HMO

## 2021-03-26 ENCOUNTER — Ambulatory Visit: Payer: Medicare HMO | Admitting: Cardiovascular Disease

## 2021-03-26 DIAGNOSIS — E8770 Fluid overload, unspecified: Secondary | ICD-10-CM | POA: Diagnosis present

## 2021-03-26 DIAGNOSIS — Z8673 Personal history of transient ischemic attack (TIA), and cerebral infarction without residual deficits: Secondary | ICD-10-CM | POA: Diagnosis not present

## 2021-03-26 DIAGNOSIS — R0602 Shortness of breath: Secondary | ICD-10-CM

## 2021-03-26 DIAGNOSIS — J9601 Acute respiratory failure with hypoxia: Secondary | ICD-10-CM | POA: Diagnosis present

## 2021-03-26 DIAGNOSIS — Z888 Allergy status to other drugs, medicaments and biological substances status: Secondary | ICD-10-CM | POA: Diagnosis not present

## 2021-03-26 DIAGNOSIS — D631 Anemia in chronic kidney disease: Secondary | ICD-10-CM | POA: Diagnosis present

## 2021-03-26 DIAGNOSIS — Z8249 Family history of ischemic heart disease and other diseases of the circulatory system: Secondary | ICD-10-CM | POA: Diagnosis not present

## 2021-03-26 DIAGNOSIS — N2581 Secondary hyperparathyroidism of renal origin: Secondary | ICD-10-CM | POA: Diagnosis present

## 2021-03-26 DIAGNOSIS — Z833 Family history of diabetes mellitus: Secondary | ICD-10-CM | POA: Diagnosis not present

## 2021-03-26 DIAGNOSIS — D509 Iron deficiency anemia, unspecified: Secondary | ICD-10-CM | POA: Diagnosis present

## 2021-03-26 DIAGNOSIS — Z79899 Other long term (current) drug therapy: Secondary | ICD-10-CM | POA: Diagnosis not present

## 2021-03-26 DIAGNOSIS — G40909 Epilepsy, unspecified, not intractable, without status epilepticus: Secondary | ICD-10-CM | POA: Diagnosis present

## 2021-03-26 DIAGNOSIS — I10 Essential (primary) hypertension: Secondary | ICD-10-CM | POA: Diagnosis not present

## 2021-03-26 DIAGNOSIS — F17210 Nicotine dependence, cigarettes, uncomplicated: Secondary | ICD-10-CM | POA: Diagnosis present

## 2021-03-26 DIAGNOSIS — E8889 Other specified metabolic disorders: Secondary | ICD-10-CM | POA: Diagnosis present

## 2021-03-26 DIAGNOSIS — Z992 Dependence on renal dialysis: Secondary | ICD-10-CM | POA: Diagnosis not present

## 2021-03-26 DIAGNOSIS — N186 End stage renal disease: Secondary | ICD-10-CM | POA: Diagnosis present

## 2021-03-26 DIAGNOSIS — Z20822 Contact with and (suspected) exposure to covid-19: Secondary | ICD-10-CM | POA: Diagnosis present

## 2021-03-26 DIAGNOSIS — I120 Hypertensive chronic kidney disease with stage 5 chronic kidney disease or end stage renal disease: Secondary | ICD-10-CM | POA: Diagnosis present

## 2021-03-26 DIAGNOSIS — R0902 Hypoxemia: Secondary | ICD-10-CM | POA: Diagnosis present

## 2021-03-26 LAB — CBC
HCT: 23.4 % — ABNORMAL LOW (ref 36.0–46.0)
Hemoglobin: 7.7 g/dL — ABNORMAL LOW (ref 12.0–15.0)
MCH: 33.6 pg (ref 26.0–34.0)
MCHC: 32.9 g/dL (ref 30.0–36.0)
MCV: 102.2 fL — ABNORMAL HIGH (ref 80.0–100.0)
Platelets: 75 10*3/uL — ABNORMAL LOW (ref 150–400)
RBC: 2.29 MIL/uL — ABNORMAL LOW (ref 3.87–5.11)
RDW: 15.3 % (ref 11.5–15.5)
WBC: 3.6 10*3/uL — ABNORMAL LOW (ref 4.0–10.5)
nRBC: 0 % (ref 0.0–0.2)

## 2021-03-26 LAB — ECHOCARDIOGRAM COMPLETE
Area-P 1/2: 3.21 cm2
Calc EF: 63.9 %
Height: 60 in
S' Lateral: 3 cm
Single Plane A2C EF: 59.4 %
Single Plane A4C EF: 63.5 %
Weight: 1940.05 oz

## 2021-03-26 LAB — RENAL FUNCTION PANEL
Albumin: 3.2 g/dL — ABNORMAL LOW (ref 3.5–5.0)
Anion gap: 12 (ref 5–15)
BUN: 58 mg/dL — ABNORMAL HIGH (ref 6–20)
CO2: 25 mmol/L (ref 22–32)
Calcium: 9.1 mg/dL (ref 8.9–10.3)
Chloride: 97 mmol/L — ABNORMAL LOW (ref 98–111)
Creatinine, Ser: 9.18 mg/dL — ABNORMAL HIGH (ref 0.44–1.00)
GFR, Estimated: 5 mL/min — ABNORMAL LOW (ref >60)
Glucose, Bld: 108 mg/dL — ABNORMAL HIGH (ref 70–99)
Phosphorus: 4.4 mg/dL (ref 2.5–4.6)
Potassium: 4 mmol/L (ref 3.5–5.1)
Sodium: 134 mmol/L — ABNORMAL LOW (ref 135–145)

## 2021-03-26 LAB — D-DIMER, QUANTITATIVE: D-Dimer, Quant: 1.16 ug/mL-FEU — ABNORMAL HIGH (ref 0.00–0.50)

## 2021-03-26 LAB — HEPATITIS B SURFACE ANTIGEN: Hepatitis B Surface Ag: NONREACTIVE

## 2021-03-26 LAB — HEPATITIS B SURFACE ANTIBODY,QUALITATIVE: Hep B S Ab: REACTIVE — AB

## 2021-03-26 MED ORDER — SODIUM CHLORIDE 0.9 % IV SOLN: 100.0000 mL | INTRAVENOUS | Status: DC | PRN

## 2021-03-26 MED ORDER — CINACALCET HCL 30 MG PO TABS
30.0000 mg | ORAL_TABLET | ORAL | Status: DC
  Administered 2021-03-26: 30 mg via ORAL
  Filled 2021-03-26: qty 1

## 2021-03-26 MED ORDER — CHLORHEXIDINE GLUCONATE CLOTH 2 % EX PADS
6.0000 | MEDICATED_PAD | Freq: Every day | CUTANEOUS | Status: DC
  Administered 2021-03-26: 6 via TOPICAL

## 2021-03-26 MED ORDER — LIDOCAINE-PRILOCAINE 2.5-2.5 % EX CREA
1.0000 "application " | TOPICAL_CREAM | CUTANEOUS | Status: DC | PRN
  Filled 2021-03-26: qty 5

## 2021-03-26 MED ORDER — LIDOCAINE HCL (PF) 1 % IJ SOLN
5.0000 mL | INTRAMUSCULAR | Status: DC | PRN
Start: 2021-03-26 — End: 2021-03-26
  Filled 2021-03-26: qty 5

## 2021-03-26 MED ORDER — ALTEPLASE 2 MG IJ SOLR: 2.0000 mg | Freq: Once | INTRAMUSCULAR | Status: DC | PRN

## 2021-03-26 MED ORDER — PENTAFLUOROPROP-TETRAFLUOROETH EX AERO: 1.0000 "application " | INHALATION_SPRAY | CUTANEOUS | Status: DC | PRN

## 2021-03-26 MED ORDER — HEPARIN SODIUM (PORCINE) 1000 UNIT/ML DIALYSIS: 1000.0000 [IU] | INTRAMUSCULAR | Status: DC | PRN

## 2021-03-26 NOTE — Care Management Obs Status (Signed)
[place] NOTIFICATION   Patient Details  Name: Madeline Brown MRN: [id number] Date of Birth: Mar 11, 1976   Medicare Observation Status Notification Given:  Yes    Cyndi Bender, RN 03/26/2021, 4:41 PM

## 2021-03-26 NOTE — Consult Note (Signed)
[facility] Renal Consultation Note    Indication for Consultation:  Management of ESRD/hemodialysis; anemia, hypertension/volume and secondary hyperparathyroidism  HPI: Madeline Brown is a 45 y.o. female with ESRD on HD TTS, HTN, seizure disorder who is admitted under observation status with progressive dyspnea. She was hypoxic on arrival to the ED and requiring 2L [place] to maintain O2 sats. CXR showed mild pulmonary edema. Labs Na 138, K 3.4, BUN 45 Cr 8.44, Hgb 8.5. She started feeling SOB Friday. She went to her scheduled dialysis on Saturday and her fluid goal was challenged and she left 0.7 kg below her dry weight. She has been complaint with dialysis and does believe she has lost weight recently. This morning she continues to endorse DOE. Plan for dialysis today.   Past Medical History:  Diagnosis Date   Anemia    Chronic kidney disease    Headache, unspecified 09/26/2017   History of blood transfusion 07/2020   Hypertension    Seizure ([place]) 02/2019   Shortness of breath 07/15/2018   01/16/21 not currently   Stroke [facility])    Mild was told after having a seizure   Past Surgical History:  Procedure Laterality Date   AV FISTULA PLACEMENT Left 06/24/2017   Procedure: CREATION OF LEFT ARM BRACHIALCEPHALIC  ARTERIOVENOUS (AV) FISTULA;  Surgeon: Rosetta Posner, MD;  Location: [place];  Service: Vascular;  Laterality: Left;   CESAREAN SECTION     x2   COMPLEX WOUND CLOSURE Left 07/30/2020   Procedure: COMPLEX WOUND CLOSURE;  Surgeon: Cherre Robins, MD;  Location: [place];  Service: Vascular;  Laterality: Left;   FISTULA SUPERFICIALIZATION Left 10/28/2017   Procedure: SUPERFICIALIZATION LEFT BRACHIOCEPHALIC ARTERIOVENOUS FISTULA;  Surgeon: Rosetta Posner, MD;  Location: [place];  Service: Vascular;  Laterality: Left;   INGUINAL HERNIA REPAIR Bilateral 02/01/2021   Procedure: LAPAROSCOPIC BILATERAL INGUINAL HERNIA REPAIR WITH MESH;  Surgeon: Kinsinger, Arta Bruce, MD;  [facility];   Service: General;  Laterality: Bilateral;   INSERTION OF DIALYSIS CATHETER Right 06/24/2017   Procedure: INSERTION OF TUNNELED DIALYSIS CATHETER;  Surgeon: Rosetta Posner, MD;  Location: [place];  Service: Vascular;  Laterality: Right;   INSERTION OF DIALYSIS CATHETER Right 07/30/2020   Procedure: INSERTION OF DIALYSIS CATHETER;  Surgeon: Cherre Robins, MD;  [facility];  Service: Vascular;  Laterality: Right;   LIGATION OF COMPETING BRANCHES OF ARTERIOVENOUS FISTULA  10/28/2017   Procedure: LIGATION OF COMPETING BRANCHES OF ARTERIOVENOUS FISTULA x3;  Surgeon: Rosetta Posner, MD;  [facility];  Service: Vascular;;   REVISON OF ARTERIOVENOUS FISTULA Left 07/30/2020   Procedure: LEFT UPPER EXTREMITY ARTERIOVENOUS FISTULA REVISON;  Surgeon: Cherre Robins, MD;  [facility];  Service: Vascular;  Laterality: Left;  PERIPHERAL NERVE BLOCK   Family History  Problem Relation Age of Onset   Diabetes Father    Hypertension Father    Cancer Father    Heart disease Father    Aneurysm Mother    Seizures Mother    Social History:  reports that she has been smoking cigarettes. She has a 3.75 pack-year smoking history. She has never used smokeless tobacco. She reports that she does not currently use alcohol. She reports current drug use. Drug: Marijuana. Allergies  Allergen Reactions   Visine [Tetrahydrozoline Hcl] Swelling    Eyes Swelling   Prior to Admission medications   Medication Sig Start Date End Date Taking? Authorizing Provider  acetaminophen (TYLENOL) 325 MG tablet Take 650 mg  by mouth every 6 (six) hours as needed for mild pain or moderate pain (for dialysis treatment).   Yes [provider]  amLODipine (NORVASC) 10 MG tablet Take 1 tablet (10 mg total) by mouth at bedtime. 12/09/19 01/29/22 Yes Aslam, Loralyn Freshwater, MD  B Complex-C-Folic Acid (DIALYVITE TABLET) TABS Take 1 tablet by mouth daily. 09/27/19  Yes [provider]  carvedilol (COREG) 25 MG tablet Take 1 tablet  (25 mg total) by mouth 2 (two) times daily with a meal. Patient taking differently: Take 25 mg by mouth 2 (two) times daily with a meal. Patient states they take on Tuesdays, Thursdays, and Saturdays at 1100 and 2000 Take on Sundays, Mondays, Wednesdays, and Fridays at 0630 and 1800 12/09/19 01/29/22 Yes Aslam, Sadia, MD  Cinacalcet HCl (SENSIPAR PO) Take 1 capsule by mouth See admin instructions. Patient unclear of dose, patient only receives at the end of dialysis. 12/13/20 12/12/21 Yes [provider]  diphenhydrAMINE (BENADRYL) 25 MG tablet Take 25 mg by mouth every 6 (six) hours as needed for itching or sleep (for dialysis treatment).   Yes [provider]  hydrALAZINE (APRESOLINE) 100 MG tablet Take 1 tablet (100 mg total) by mouth 3 (three) times daily. 12/09/19 01/30/23 Yes Aslam, Loralyn Freshwater, MD  sevelamer carbonate (RENVELA) 800 MG tablet Take 2 tablets (1,600 mg total) by mouth 3 (three) times daily with meals. Patient taking differently: Take 1,600 mg by mouth See admin instructions. Take 2 tablets (1600 mg) by mouth 3 times daily with meals & take 1 tablet (800 mg) by mouth with snacks 02/21/19  Yes Madalyn Rob, MD  spironolactone (ALDACTONE) 50 MG tablet Take 1 tablet (50 mg total) by mouth daily. 12/09/19  Yes Aslam, Loralyn Freshwater, MD  levETIRAcetam (KEPPRA) 1000 MG tablet Take 1 tablet (1,000 mg total) by mouth 2 (two) times daily. Patient taking differently: Take 1,000 mg by mouth in the morning. 12/07/20   Penumalli, Earlean Polka, MD  oxyCODONE (OXY IR/ROXICODONE) 5 MG immediate release tablet Take 1 tablet (5 mg total) by mouth every 6 (six) hours as needed for severe pain. Patient not taking: No sig reported 02/01/21   Kinsinger, Arta Bruce, MD   Current Facility-Administered Medications  Medication Dose Route Frequency Provider Last Rate Last Admin   acetaminophen (TYLENOL) tablet 650 mg  650 mg Oral Q6H PRN Barb Merino, MD       amLODipine (NORVASC) tablet 10 mg  10 mg Oral QHS  Barb Merino, MD   10 mg at 03/25/21 2219   carvedilol (COREG) tablet 25 mg  25 mg Oral BID WC Barb Merino, MD       Chlorhexidine Gluconate Cloth 2 % PADS 6 each  6 each Topical Q0600 Gean Quint, MD       diphenhydrAMINE (BENADRYL) capsule 25 mg  25 mg Oral Q6H PRN Barb Merino, MD       heparin injection 5,000 Units  5,000 Units Subcutaneous Q8H Barb Merino, MD   5,000 Units at 03/26/21 4665   hydrALAZINE (APRESOLINE) tablet 100 mg  100 mg Oral TID Barb Merino, MD   100 mg at 03/25/21 2219   levETIRAcetam (KEPPRA) tablet 1,000 mg  1,000 mg Oral q AM Barb Merino, MD       sevelamer carbonate (RENVELA) tablet 1,600 mg  1,600 mg Oral TID WC Barb Merino, MD       sevelamer carbonate (RENVELA) tablet 800 mg  800 mg Oral With snacks Barb Merino, MD       sodium  chloride flush (NS) 0.9 % injection 3 mL  3 mL Intravenous Q12H Barb Merino, MD   3 mL at 03/25/21 2226   spironolactone (ALDACTONE) tablet 50 mg  50 mg Oral Daily Barb Merino, MD         ROS: As per HPI otherwise negative.  Physical Exam: Vitals:   03/26/21 0030 03/26/21 0220 03/26/21 0712 03/26/21 0826  BP: 131/88 (!) 144/95 (!) 149/99 (!) 151/101  Pulse: 91 89 86 92  Resp: (!) 25 (!) 23 13 17   Temp:   97.9 F (36.6 C) 98.9 F (37.2 C)  TempSrc:   Oral Oral  SpO2: 97% 96% 99% 100%  Weight:      Height:         General: Well appearing, nad, on nasal O2  Head: NCAT sclera not icteric MMM Neck: Supple. No JVD appreciated  Lungs: CTA bilaterally without wheezes, rales, or rhonchi. Breathing is unlabored. Heart: RRR with S1 S2 Abdomen: soft NTND Lower extremities: No significant LE edema  Neuro: A & O  X 3. Moves all extremities spontaneously. Psych:  Responds to questions appropriately with a normal affect. Dialysis Access: LUE AVF +bruit   Labs: Basic Metabolic Panel: Recent Labs  Lab 03/25/21 1330  NA 138  K 3.4*  CL 100  CO2 26  GLUCOSE 101*  BUN 45*  CREATININE 8.44*  CALCIUM 9.6    Liver Function Tests: Recent Labs  Lab 03/25/21 1330  AST 16  ALT 9  ALKPHOS 32*  BILITOT 1.6*  PROT 6.9  ALBUMIN 3.6   No results for input(s): LIPASE, AMYLASE in the last 168 hours. No results for input(s): AMMONIA in the last 168 hours. CBC: Recent Labs  Lab 03/25/21 0742  WBC 3.7*  NEUTROABS 2.5  HGB 8.5*  HCT 26.2*  MCV 105.6*  PLT 73*   Cardiac Enzymes: No results for input(s): CKTOTAL, CKMB, CKMBINDEX, TROPONINI in the last 168 hours. CBG: No results for input(s): GLUCAP in the last 168 hours. Iron Studies:  Recent Labs    03/25/21 1330  IRON 47  TIBC 260  FERRITIN 1,238*   Studies/Results: DG Chest Portable 1 View  Result Date: 03/25/2021 CLINICAL DATA:  SOB EXAM: PORTABLE CHEST 1 VIEW COMPARISON:  Multiple priors, most recent August 07, 2020. FINDINGS: Enlarged cardiac silhouette. Probable small bilateral pleural effusions. No visible pneumothorax. Streaky bibasilar opacities. Mild diffuse interstitial prominence. No acute osseous abnormality. IMPRESSION: 1. Cardiomegaly with small bilateral pleural effusions. Mild diffuse interstitial prominence, which could represent the sequela of recurrent bouts of CHF or mild interstitial edema. 2. Mild bibasilar opacities, most likely atelectasis. Pneumonia is not excluded. Electronically Signed   By: Margaretha Sheffield M.D.   On: 03/25/2021 12:49    Dialysis Orders:  [place] [facility] 3h 17min 350/A1.5x  EDW 55.7 kg 2K/2Ca AVF No heparin  -Sensipar 30 TIW  -Venofer 50 q wk (last 10/4)  -Mircera 100 q 2 wks (last 9/29)    Assessment/Plan: Dyspnea - Progressive. Suspect this is due excess volume related to loss of body weight. Plan to lower her outpatient dry weight at discharge.  HD today to get volume down. UF goal 3-4L as tolerated.   ESRD -  HD TTS. HD on schedule today   Hypertension- Blood pressure elevated on admission. Should improve with UF on HD today. Follow   Anemia  - Hgb 8.5 on ESA/weekly Fe as outpatient.  Next ESA due 10/13.   Metabolic bone disease -  Continue home binders, Sensipar while  here   Nutrition - Renal diet/fluid restriction.   Lynnda Child PA-C [facility] 03/26/2021, 9:49 AM

## 2021-03-26 NOTE — Plan of Care (Signed)

## 2021-03-26 NOTE — TOC Initial Note (Signed)
Transition of Care [facility]) - Initial/Assessment Note    Patient Details  Name: Madeline Brown MRN: [id number] Date of Birth: 1976-04-15  Transition of Care [facility]) CM/SW Contact:    Verdell Carmine, RN Phone Number: 03/26/2021, 11:32 AM  Clinical Narrative:                  45 year old patient ESRD presented to the ED for [facility] exertional. She received dialysis on Saturday and went under her dry weight. BNP was in the 600's in the ED.  She states that she is taking her medications as directed.   CM renal navigator will follow patient for needs, recommendations, and transitions.   Expected Discharge Plan: Home/Self Care Barriers to Discharge: Continued Medical Work up   Patient Goals and CMS Choice        Expected Discharge Plan and Services Expected Discharge Plan: Home/Self Care       Living arrangements for the past 2 months: Apartment                                      Prior Living Arrangements/Services Living arrangements for the past 2 months: Apartment Lives with:: Significant Other Patient language and need for interpreter reviewed:: Yes        Need for Family Participation in Patient Care: Yes (Comment) Care giver support system in place?: Yes (comment)   Criminal Activity/Legal Involvement Pertinent to Current Situation/Hospitalization: No - Comment as needed  Activities of Daily Living      Permission Sought/Granted                  Emotional Assessment       Orientation: : Oriented to Self, Oriented to  Time, Oriented to Place, Oriented to Situation Alcohol / Substance Use: Not Applicable Psych Involvement: No (comment)  Admission diagnosis:  Hypoxemia [R09.02] SOB (shortness of breath) [R06.02] Fluid overload [E87.70] Patient Active Problem List   Diagnosis Date Noted   Fluid overload 50/02/3817   Complication of vascular dialysis catheter 09/17/2020   Right lower lobe pneumonia 08/07/2020   Other mechanical complication of  surgically created arteriovenous fistula, subsequent encounter 07/25/2020   Fluid overload, unspecified 12/09/2019   Acute respiratory failure with hypoxia ([place]) 12/08/2019   Hypertensive urgency 12/07/2019   A-V fistula ([place]) 11/28/2019   Current smoker 11/28/2019   History of intracranial hemorrhage 11/28/2019   Transplanted organ and tissue status, 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/15/2019   Intracerebral hemorrhage 02/21/2019   Intracranial hematoma 02/20/2019   Seizure ([place]) 02/19/2019   Pain, unspecified 08/23/2018   Shortness of breath 07/15/2018   Pruritus, unspecified 10/28/2017   Headache, unspecified 09/26/2017   Anemia in chronic kidney disease 06/30/2017   Coagulation defect, unspecified ([place]) 06/30/2017   Hypertensive chronic kidney disease with stage 1 through stage 4 chronic kidney disease, or unspecified chronic kidney disease 06/30/2017   Iron deficiency anemia, unspecified 06/30/2017   Encounter for fitting and adjustment of extracorporeal dialysis catheter ([place]) 06/30/2017   Secondary hyperparathyroidism of renal origin ([place]) 06/30/2017   ESRD (end stage renal disease) ([place]) 06/24/2017   Hypertension 03/23/2017   Anemia 02/12/2017   PCP:  Sonia Side., FNP Pharmacy:   [facility] [address] [place]), [place] - [address] [place] [address] Phone: [phone] Fax: [phone]  [facility] - [place], [place] - [address], [place] [address] Phone: [phone] Fax: [phone]  [name] [address] [place] [address] Phone: [phone] Fax: [phone]     Social Determinants of Health (SDOH) Interventions    Readmission Risk Interventions No flowsheet data found.

## 2021-03-26 NOTE — ED Notes (Signed)
ED TO INPATIENT HANDOFF REPORT  ED Nurse Name and Phone #: Baxter Flattery, RN [phone]  S Name/Age/Gender Madeline Brown 45 y.o. female Room/Bed: 037C/037C  Code Status   Code Status: Full Code  Home/SNF/Other Home Patient oriented to: self, place, time, and situation Is this baseline? Yes   Triage Complete: Triage complete  Chief Complaint Fluid overload [E87.70]  Triage Note Pt. Stated, Donnald Garre had SOB off and on for about a week. No other symptoms  Pt's last dialysis was Sat , almost to dry weight. No SOB while taking dialysis    Allergies Allergies  Allergen Reactions   Visine [Tetrahydrozoline Hcl] Swelling    Eyes Swelling    Level of Care/Admitting Diagnosis ED Disposition     ED Disposition  Admit   Condition  --   Comment  Hospital Area: [place] [[id number]]  Level of Care: Telemetry Medical [104]  May place patient in observation at [facility] or [place] if equivalent level of care is available:: No  Covid Evaluation: Asymptomatic Screening Protocol (No Symptoms)  Diagnosis: Fluid overload [440347]  Admitting Physician: Barb Merino [[id number]]  Attending Physician: Barb Merino [[id number]]          B Medical/Surgery History Past Medical History:  Diagnosis Date   Anemia    Chronic kidney disease    Headache, unspecified 09/26/2017   History of blood transfusion 07/2020   Hypertension    Seizure ([place]) 02/2019   Shortness of breath 07/15/2018   01/16/21 not currently   Stroke [facility])    Mild was told after having a seizure   Past Surgical History:  Procedure Laterality Date   AV FISTULA PLACEMENT Left 06/24/2017   Procedure: CREATION OF LEFT ARM [place] (AV) FISTULA;  Surgeon: Rosetta Posner, MD;  Location: [place];  Service: Vascular;  Laterality: Left;   CESAREAN SECTION     x2   COMPLEX WOUND CLOSURE Left 07/30/2020   Procedure: COMPLEX WOUND CLOSURE;  Surgeon: Cherre Robins, MD;  Location: [place];  Service:  Vascular;  Laterality: Left;   FISTULA SUPERFICIALIZATION Left 10/28/2017   Procedure: SUPERFICIALIZATION LEFT BRACHIOCEPHALIC ARTERIOVENOUS FISTULA;  Surgeon: Rosetta Posner, MD;  Location: [place];  Service: Vascular;  Laterality: Left;   INGUINAL HERNIA REPAIR Bilateral 02/01/2021   Procedure: LAPAROSCOPIC BILATERAL INGUINAL HERNIA REPAIR WITH MESH;  Surgeon: Kinsinger, Arta Bruce, MD;  Location: [facility];  Service: General;  Laterality: Bilateral;   INSERTION OF DIALYSIS CATHETER Right 06/24/2017   Procedure: INSERTION OF TUNNELED DIALYSIS CATHETER;  Surgeon: Rosetta Posner, MD;  Location: [place];  Service: Vascular;  Laterality: Right;   INSERTION OF DIALYSIS CATHETER Right 07/30/2020   Procedure: INSERTION OF DIALYSIS CATHETER;  Surgeon: Cherre Robins, MD;  Location: [place];  Service: Vascular;  Laterality: Right;   LIGATION OF COMPETING BRANCHES OF ARTERIOVENOUS FISTULA  10/28/2017   Procedure: LIGATION OF COMPETING BRANCHES OF ARTERIOVENOUS FISTULA x3;  Surgeon: Rosetta Posner, MD;  Location: [place];  Service: Vascular;;   REVISON OF ARTERIOVENOUS FISTULA Left 07/30/2020   Procedure: LEFT UPPER EXTREMITY ARTERIOVENOUS FISTULA REVISON;  Surgeon: Cherre Robins, MD;  Location[facility];  Service: Vascular;  Laterality: Left;  PERIPHERAL NERVE BLOCK     A IV Location/Drains/Wounds Patient Lines/Drains/Airways Status     Active Line/Drains/Airways     Name Placement date Placement time Site Days   Peripheral IV 03/25/21 20 G Right Antecubital 03/25/21  1325  Antecubital  1   Fistula /  Graft Left Upper arm Arteriovenous fistula 06/24/17  1411  Upper arm  1371   Hemodialysis Catheter Right Subclavian Double-lumen 06/24/17  1321  Subclavian  1371   Hemodialysis Catheter Right Internal jugular Double lumen Permanent (Tunneled) 07/30/20  1016  Internal jugular  239   Incision (Closed) 06/24/17 Arm Left 06/24/17  1423  -- 1371   Incision (Closed) 10/28/17 Arm Left 10/28/17  1101  -- 1245   Incision  (Closed) 07/30/20 Arm Left 07/30/20  0846  -- 239   Incision (Closed) 07/30/20 Chest Right 07/30/20  0846  -- 239   Incision (Closed) 07/30/20 Neck Right 07/30/20  0846  -- 239   Incision - 3 Ports Abdomen Right Umbilicus Left 54/62/70  1039  -- 53            Intake/Output Last 24 hours  Intake/Output Summary (Last 24 hours) at 03/26/2021 3500 Last data filed at 03/25/2021 2225 Gross per 24 hour  Intake 400 ml  Output --  Net 400 ml    Labs/Imaging Results for orders placed or performed during the hospital encounter of 03/25/21 (from the past 48 hour(s))  Brain natriuretic peptide     Status: Abnormal   Collection Time: 03/25/21  7:42 AM  Result Value Ref Range   B Natriuretic Peptide 658.6 (H) 0.0 - 100.0 pg/mL    Comment: Performed at [facility] [address], [place], [place] [address]  CBC WITH DIFFERENTIAL     Status: Abnormal   Collection Time: 03/25/21  7:42 AM  Result Value Ref Range   WBC 3.7 (L) 4.0 - 10.5 K/uL   RBC 2.48 (L) 3.87 - 5.11 MIL/uL   Hemoglobin 8.5 (L) 12.0 - 15.0 g/dL   HCT 26.2 (L) 36.0 - 46.0 %   MCV 105.6 (H) 80.0 - 100.0 fL   MCH 34.3 (H) 26.0 - 34.0 pg   MCHC 32.4 30.0 - 36.0 g/dL   RDW 15.9 (H) 11.5 - 15.5 %   Platelets 73 (L) 150 - 400 K/uL    Comment: Immature Platelet Fraction may be clinically indicated, consider ordering this additional test [id number] REPEATED TO VERIFY PLATELET COUNT CONFIRMED BY SMEAR    nRBC 0.0 0.0 - 0.2 %   Neutrophils Relative % 69 %   Neutro Abs 2.5 1.7 - 7.7 K/uL   Lymphocytes Relative 17 %   Lymphs Abs 0.6 (L) 0.7 - 4.0 K/uL   Monocytes Relative 8 %   Monocytes Absolute 0.3 0.1 - 1.0 K/uL   Eosinophils Relative 4 %   Eosinophils Absolute 0.2 0.0 - 0.5 K/uL   Basophils Relative 1 %   Basophils Absolute 0.0 0.0 - 0.1 K/uL   Immature Granulocytes 1 %   Abs Immature Granulocytes 0.02 0.00 - 0.07 K/uL    Comment: Performed at [facility], [place] [address], [place], [place] [address]   Resp Panel by RT-PCR (Flu A&B, Covid) Nasopharyngeal Swab     Status: None   Collection Time: 03/25/21 11:48 AM   Specimen: Nasopharyngeal Swab; Nasopharyngeal(NP) swabs in vial transport medium  Result Value Ref Range   SARS Coronavirus 2 by RT PCR NEGATIVE NEGATIVE    Comment: (NOTE) SARS-CoV-2 target nucleic acids are NOT DETECTED.  The SARS-CoV-2 RNA is generally detectable in upper respiratory specimens during the acute phase of infection. The lowest concentration of SARS-CoV-2 viral copies this assay can detect is 138 copies/mL. A negative result does not preclude SARS-Cov-2 infection and should not be used as  the sole basis for treatment or other patient management decisions. A negative result may occur with  improper specimen collection/handling, submission of specimen other than nasopharyngeal swab, presence of viral mutation(s) within the areas targeted by this assay, and inadequate number of viral copies(<138 copies/mL). A negative result must be combined with clinical observations, patient history, and epidemiological information. The expected result is Negative.  Fact Sheet for Patients:  EntrepreneurPulse.com.au  Fact Sheet for Healthcare Providers:  IncredibleEmployment.be  This test is no t yet approved or cleared by the [place] FDA and  has been authorized for detection and/or diagnosis of SARS-CoV-2 by FDA under an Emergency Use Authorization (EUA). This EUA will remain  in effect (meaning this test can be used) for the duration of the COVID-19 declaration under Section 564(b)(1) of the Act, 21 U.S.C.section 360bbb-3(b)(1), unless the authorization is terminated  or revoked sooner.       Influenza A by PCR NEGATIVE NEGATIVE   Influenza B by PCR NEGATIVE NEGATIVE    Comment: (NOTE) The Xpert Xpress SARS-CoV-2/FLU/RSV plus assay is intended as an aid in the diagnosis of influenza from Nasopharyngeal swab specimens  and should not be used as a sole basis for treatment. Nasal washings and aspirates are unacceptable for Xpert Xpress SARS-CoV-2/FLU/RSV testing.  Fact Sheet for Patients: EntrepreneurPulse.com.au  Fact Sheet for Healthcare Providers: IncredibleEmployment.be  This test is not yet approved or cleared by the [place] FDA and has been authorized for detection and/or diagnosis of SARS-CoV-2 by FDA under an Emergency Use Authorization (EUA). This EUA will remain in effect (meaning this test can be used) for the duration of the COVID-19 declaration under Section 564(b)(1) of the Act, 21 U.S.C. section 360bbb-3(b)(1), unless the authorization is terminated or revoked.  Performed at [facility], [place] [address], [place], [place] [address]   Comprehensive metabolic panel     Status: Abnormal   Collection Time: 03/25/21  1:30 PM  Result Value Ref Range   Sodium 138 135 - 145 mmol/L   Potassium 3.4 (L) 3.5 - 5.1 mmol/L   Chloride 100 98 - 111 mmol/L   CO2 26 22 - 32 mmol/L   Glucose, Bld 101 (H) 70 - 99 mg/dL    Comment: Glucose reference range applies only to samples taken after fasting for at least 8 hours.   BUN 45 (H) 6 - 20 mg/dL   Creatinine, Ser 8.44 (H) 0.44 - 1.00 mg/dL   Calcium 9.6 8.9 - 10.3 mg/dL   Total Protein 6.9 6.5 - 8.1 g/dL   Albumin 3.6 3.5 - 5.0 g/dL   AST 16 15 - 41 U/L   ALT 9 0 - 44 U/L   Alkaline Phosphatase 32 (L) 38 - 126 U/L   Total Bilirubin 1.6 (H) 0.3 - 1.2 mg/dL   GFR, Estimated 6 (L) >60 mL/min    Comment: (NOTE) Calculated using the CKD-EPI Creatinine Equation (2021)    Anion gap 12 5 - 15    Comment: Performed at [facility] [address] [place], [place] [address]  Vitamin B12     Status: Abnormal   Collection Time: 03/25/21  1:30 PM  Result Value Ref Range   Vitamin B-12 1,100 (H) 180 - 914 pg/mL    Comment: (NOTE) This assay is not validated for testing neonatal or myeloproliferative  syndrome specimens for Vitamin B12 levels. Performed at [facility], [place] [address], [place], [place] [address]   Folate  Status: None   Collection Time: 03/25/21  1:30 PM  Result Value Ref Range   Folate 10.7 >5.9 ng/mL    Comment: Performed at [facility], [place] [address] [place], [place] [address]  Iron and TIBC     Status: None   Collection Time: 03/25/21  1:30 PM  Result Value Ref Range   Iron 47 28 - 170 ug/dL   TIBC 260 250 - 450 ug/dL   Saturation Ratios 18 10.4 - 31.8 %   UIBC 213 ug/dL    Comment: Performed at [facility] [address], [place], [place] [address]  Ferritin     Status: Abnormal   Collection Time: 03/25/21  1:30 PM  Result Value Ref Range   Ferritin 1,238 (H) 11 - 307 ng/mL    Comment: Performed at [facility], [place] [address] [place], [place] [address]  Reticulocytes     Status: Abnormal   Collection Time: 03/25/21  1:30 PM  Result Value Ref Range   Retic Ct Pct 5.5 (H) 0.4 - 3.1 %   RBC. 2.63 (L) 3.87 - 5.11 MIL/uL   Retic Count, Absolute 143.3 19.0 - 186.0 K/uL   Immature Retic Fract 18.5 (H) 2.3 - 15.9 %    Comment: Performed at [facility] [address] [place], [place] [address]   DG Chest Portable 1 View  Result Date: 03/25/2021 CLINICAL DATA:  SOB EXAM: PORTABLE CHEST 1 VIEW COMPARISON:  Multiple priors, most recent August 07, 2020. FINDINGS: Enlarged cardiac silhouette. Probable small bilateral pleural effusions. No visible pneumothorax. Streaky bibasilar opacities. Mild diffuse interstitial prominence. No acute osseous abnormality. IMPRESSION: 1. Cardiomegaly with small bilateral pleural effusions. Mild diffuse interstitial prominence, which could represent the sequela of recurrent bouts of CHF or mild interstitial edema. 2. Mild bibasilar opacities, most likely atelectasis. Pneumonia is not excluded. Electronically Signed   By: Margaretha Sheffield M.D.   On: 03/25/2021 12:49    Pending Labs Unresulted  Labs (From admission, onward)     Start     Ordered   03/25/21 1612  D-dimer, quantitative  Once,   R        03/25/21 1611   Signed and Held  Renal function panel  Once,   R        Signed and Held   Signed and Held  CBC  Once,   R        Signed and Held   Signed and Held  Hepatitis B surface antigen  (New Admission Hemo Labs (Hepatitis B))  Once,   R        Signed and Held   Signed and Held  Hepatitis B surface antibody  (New Admission [facility] Labs (Hepatitis B))  Once,   R        Signed and Held   Signed and Held  Hepatitis B surface antibody,quantitative  (New Admission [facility] Labs (Hepatitis B))  Once,   R        Signed and Held            Vitals/Pain Today's Vitals   03/25/21 2345 03/26/21 0030 03/26/21 0220 03/26/21 0712  BP: 139/86 131/88 (!) 144/95 (!) 149/99  Pulse: 87 91 89 86  Resp: (!) 23 (!) 25 (!) 23 13  Temp:      TempSrc:      SpO2: 99% 97% 96% 99%  Weight:      Height:      PainSc:  Isolation Precautions No active isolations  Medications Medications  acetaminophen (TYLENOL) tablet 650 mg (has no administration in time range)  amLODipine (NORVASC) tablet 10 mg (10 mg Oral Given 03/25/21 2219)  carvedilol (COREG) tablet 25 mg (has no administration in time range)  hydrALAZINE (APRESOLINE) tablet 100 mg (100 mg Oral Given 03/25/21 2219)  spironolactone (ALDACTONE) tablet 50 mg (has no administration in time range)  sevelamer carbonate (RENVELA) tablet 1,600 mg (has no administration in time range)  levETIRAcetam (KEPPRA) tablet 1,000 mg (has no administration in time range)  diphenhydrAMINE (BENADRYL) capsule 25 mg (has no administration in time range)  heparin injection 5,000 Units (5,000 Units Subcutaneous Given 03/26/21 3151)  sodium chloride flush (NS) 0.9 % injection 3 mL (3 mLs Intravenous Given 03/25/21 2226)  sevelamer carbonate (RENVELA) tablet 800 mg (has no administration in time range)  Chlorhexidine Gluconate Cloth 2 % PADS 6 each (has no  administration in time range)    Mobility walks Low fall risk   Focused Assessments Pulmonary Assessment Handoff:  Lung sounds: Bilateral Breath Sounds: Clear O2 Device: Nasal Cannula O2 Flow Rate (L/min): 2 L/min     R Recommendations: See Admitting Provider Note  Report given to:   Additional Notes:

## 2021-03-26 NOTE — Progress Notes (Signed)
PROGRESS NOTE  Madeline Brown    DOB: 11-13-1975, 44 y.o.  BMW:[id number]  PCP: Sonia Side., FNP   Code Status: Full Code   DOA: 03/25/2021   LOS: 0  Brief Narrative of Current Hospitalization  Madeline Brown is a 45 y.o. female with a PMH significant for ESRD on HD TTS, HTN. They presented from home to the ED on 03/25/2021 with SOB x7 days. In the ED, it was found that they had hypoxia likely 2/2 volume overload based on exam and chest ray. They were treated with oxygen via [place].  Patient was admitted to medicine service for further workup and management of acute respiratory distress with hypoxemia as outlined in detail below.  03/26/21 -stable  Assessment & Plan  Principal Problem:   Fluid overload Active Problems:   Hypertension   ESRD (end stage renal disease) ([place])   Acute respiratory failure with hypoxia (HCC)   Iron deficiency anemia, unspecified  Acute hypoxemic respiratory failure likely 2/2 volume overload- on 2L this am via [place]. Comfortable.  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  Likely to improve with dialysis today. -Continue home medications including beta-blockers and hydralazine.  She is also on Aldactone.   Seizure disorder: Stable on Keppra.  DVT prophylaxis: heparin injection 5,000 Units Start: 03/25/21 2215   Diet:  Diet Orders (From admission, onward)     Start     Ordered   03/25/21 1613  Diet Heart Room service appropriate? Yes; Fluid consistency: Thin  Diet effective now       Question Answer Comment  Room service appropriate? Yes   Fluid consistency: Thin      03/25/21 1612            Subjective 03/26/21    Pt reports sleeping comfortable  Disposition Plan & Communication  Status is:  Observation  The patient will require care spanning > 2 midnights and should be moved to inpatient because: Inpatient level of care appropriate due to severity of illness  Dispo: The patient is from: Home              Anticipated d/c is to: Home              Patient currently is not medically stable to d/c.    Family Communication: none   Consults, Procedures, Significant Events  Consultants:  nephrology  Procedures/significant events:  HD  Antimicrobials:  Anti-infectives (From admission, onward)    None        Objective   Vitals:   03/25/21 2345 03/26/21 0030 03/26/21 0220 03/26/21 0712  BP: 139/86 131/88 (!) 144/95 (!) 149/99  Pulse: 87 91 89 86  Resp: (!) 23 (!) 25 (!) 23 13  Temp:    97.9 F (36.6 C)  TempSrc:    Oral  SpO2: 99% 97% 96% 99%  Weight:      Height:        Intake/Output Summary (Last 24 hours) at 03/26/2021 0809 Last data filed at 03/25/2021 2225 Gross per 24 hour  Intake 400 ml  Output --  Net 400 ml   Filed Weights   03/25/21 0733  Weight: 55 kg    Patient BMI: Body mass index is 23.68 kg/m.   Physical Exam: General: asleep, NAD  HEENT: atraumatic,moist mucus membranes Respiratory: normal respiratory effort. Cardiovascular: normal S1/S2,  RRR, no JVD, murmurs, rubs, gallops, quick capillary refill  Extremities: non-pitting LE edema, normal tone Skin: dry, intact, normal temperature, normal color, No rashes, lesions or ulcers  Labs   I have personally reviewed following labs and imaging studies Admission on 03/25/2021  Component Date Value Ref Range Status   B Natriuretic Peptide 03/25/2021 658.6 (A) 0.0 - 100.0 pg/mL Final   WBC 03/25/2021 3.7 (A) 4.0 - 10.5 K/uL Final   RBC 03/25/2021 2.48 (A) 3.87 - 5.11 MIL/uL Final   Hemoglobin 03/25/2021 8.5 (A) 12.0 - 15.0 g/dL Final   HCT 03/25/2021 26.2 (A) 36.0 - 46.0 % Final   MCV 03/25/2021 105.6 (A) 80.0 - 100.0 fL Final   MCH 03/25/2021 34.3 (A) 26.0 - 34.0 pg Final   MCHC  03/25/2021 32.4  30.0 - 36.0 g/dL Final   RDW 03/25/2021 15.9 (A) 11.5 - 15.5 % Final   Platelets 03/25/2021 73 (A) 150 - 400 K/uL Final   nRBC 03/25/2021 0.0  0.0 - 0.2 % Final   Neutrophils Relative % 03/25/2021 69  % Final   Neutro Abs 03/25/2021 2.5  1.7 - 7.7 K/uL Final   Lymphocytes Relative 03/25/2021 17  % Final   Lymphs Abs 03/25/2021 0.6 (A) 0.7 - 4.0 K/uL Final   Monocytes Relative 03/25/2021 8  % Final   Monocytes Absolute 03/25/2021 0.3  0.1 - 1.0 K/uL Final   Eosinophils Relative 03/25/2021 4  % Final   Eosinophils Absolute 03/25/2021 0.2  0.0 - 0.5 K/uL Final   Basophils Relative 03/25/2021 1  % Final   Basophils Absolute 03/25/2021 0.0  0.0 - 0.1 K/uL Final   Immature Granulocytes 03/25/2021 1  % Final   Abs Immature Granulocytes 03/25/2021 0.02  0.00 - 0.07 K/uL Final   Sodium 03/25/2021 138  135 - 145 mmol/L Final   Potassium 03/25/2021 3.4 (A) 3.5 - 5.1 mmol/L Final   Chloride 03/25/2021 100  98 - 111 mmol/L Final   CO2 03/25/2021 26  22 - 32 mmol/L Final   Glucose, Bld 03/25/2021 101 (A) 70 - 99 mg/dL Final   BUN 03/25/2021 45 (A) 6 - 20 mg/dL Final   Creatinine, Ser 03/25/2021 8.44 (A) 0.44 - 1.00 mg/dL Final   Calcium 03/25/2021 9.6  8.9 - 10.3 mg/dL Final   Total Protein 03/25/2021 6.9  6.5 - 8.1 g/dL Final   Albumin 03/25/2021 3.6  3.5 - 5.0 g/dL Final   AST 03/25/2021 16  15 - 41 U/L Final   ALT 03/25/2021 9  0 - 44 U/L Final   Alkaline Phosphatase 03/25/2021 32 (A) 38 - 126 U/L Final   Total Bilirubin 03/25/2021 1.6 (A) 0.3 - 1.2 mg/dL Final   GFR, Estimated 03/25/2021 6 (A) >60 mL/min Final   Anion gap 03/25/2021 12  5 - 15 Final   SARS Coronavirus 2 by RT PCR 03/25/2021 NEGATIVE  NEGATIVE Final   Influenza A by PCR 03/25/2021 NEGATIVE  NEGATIVE Final   Influenza B by PCR 03/25/2021 NEGATIVE  NEGATIVE Final   Vitamin B-12 03/25/2021 1,100 (A) 180 - 914 pg/mL Final   Folate 03/25/2021 10.7  >5.9 ng/mL Final   Iron 03/25/2021 47  28 - 170 ug/dL Final    TIBC 03/25/2021 260  250 - 450 ug/dL Final   Saturation Ratios 03/25/2021 18  10.4 - 31.8 % Final   UIBC 03/25/2021 213  ug/dL Final   Ferritin 03/25/2021 1,238 (A)  11 - 307 ng/mL Final   Retic Ct Pct 03/25/2021 5.5 (A) 0.4 - 3.1 % Final   RBC. 03/25/2021 2.63 (A) 3.87 - 5.11 MIL/uL Final   Retic Count, Absolute 03/25/2021 143.3  19.0 - 186.0 K/uL Final   Immature Retic Fract 03/25/2021 18.5 (A) 2.3 - 15.9 % Final    Imaging Studies  DG Chest Portable 1 View  Result Date: 03/25/2021 CLINICAL DATA:  SOB EXAM: PORTABLE CHEST 1 VIEW COMPARISON:  Multiple priors, most recent August 07, 2020. FINDINGS: Enlarged cardiac silhouette. Probable small bilateral pleural effusions. No visible pneumothorax. Streaky bibasilar opacities. Mild diffuse interstitial prominence. No acute osseous abnormality. IMPRESSION: 1. Cardiomegaly with small bilateral pleural effusions. Mild diffuse interstitial prominence, which could represent the sequela of recurrent bouts of CHF or mild interstitial edema. 2. Mild bibasilar opacities, most likely atelectasis. Pneumonia is not excluded. Electronically Signed   By: Margaretha Sheffield M.D.   On: 03/25/2021 12:49   Medications   Scheduled Meds:  amLODipine  10 mg Oral QHS   carvedilol  25 mg Oral BID WC   Chlorhexidine Gluconate Cloth  6 each Topical Q0600   heparin  5,000 Units Subcutaneous Q8H   hydrALAZINE  100 mg Oral TID   levETIRAcetam  1,000 mg Oral q AM   sevelamer carbonate  1,600 mg Oral TID WC   sevelamer carbonate  800 mg Oral With snacks   sodium chloride flush  3 mL Intravenous Q12H   sodium chloride flush  3 mL Intravenous Q12H   spironolactone  50 mg Oral Daily     LOS: 0 days   Time spent: >89min  Richarda Osmond, DO [facility] 03/26/2021, 8:09 AM   To contact the [facility] Attending or Consulting provider for this patient: Check the care team in [facility] for a) attending/consulting [facility] provider listed and b) the [facility] team listed Log into  [email or web address] and use [facility]'s universal password to access. If you do not have the password, please contact the hospital operator. Locate the [facility] provider you are looking for under [facility] and page to a number that you can be directly reached. If you still have difficulty reaching the provider, please page the [facility] (Director on Call) for the Hospitalists listed on amion for assistance.

## 2021-03-26 NOTE — Progress Notes (Signed)
  Echocardiogram 2D Echocardiogram has been performed.  [name] 03/26/2021, 2:33 PM

## 2021-03-27 DIAGNOSIS — R0602 Shortness of breath: Secondary | ICD-10-CM

## 2021-03-27 DIAGNOSIS — D508 Other iron deficiency anemias: Secondary | ICD-10-CM

## 2021-03-27 DIAGNOSIS — E8770 Fluid overload, unspecified: Principal | ICD-10-CM

## 2021-03-27 DIAGNOSIS — R0902 Hypoxemia: Secondary | ICD-10-CM

## 2021-03-27 LAB — HEPATITIS B SURFACE ANTIBODY, QUANTITATIVE: Hep B S AB Quant (Post): 322.4 m[IU]/mL (ref >9.9)

## 2021-03-27 MED ORDER — ONDANSETRON HCL 4 MG/2ML IJ SOLN
4.0000 mg | Freq: Four times a day (QID) | INTRAMUSCULAR | Status: DC | PRN
Start: 2021-03-27 — End: 2021-03-27
  Administered 2021-03-27: 4 mg via INTRAVENOUS
  Filled 2021-03-27: qty 2

## 2021-03-27 NOTE — Plan of Care (Signed)
?  Problem: Health Behavior/Discharge Planning: ?Goal: Ability to manage health-related needs will improve ?Outcome: Adequate for Discharge ?  ?Problem: Clinical Measurements: ?Goal: Ability to maintain clinical measurements within normal limits will improve ?Outcome: Adequate for Discharge ?Goal: Will remain free from infection ?Outcome: Adequate for Discharge ?Goal: Diagnostic test results will improve ?Outcome: Adequate for Discharge ?Goal: Respiratory complications will improve ?Outcome: Adequate for Discharge ?Goal: Cardiovascular complication will be avoided ?Outcome: Adequate for Discharge ?  ?

## 2021-03-27 NOTE — Plan of Care (Signed)
  Problem: Health Behavior/Discharge Planning: Goal: Ability to manage health-related needs will improve 03/27/2021 1050 by Sindy Messing, RN Outcome: Adequate for Discharge 03/27/2021 1050 by Sindy Messing, RN Outcome: Adequate for Discharge   Problem: Clinical Measurements: Goal: Ability to maintain clinical measurements within normal limits will improve 03/27/2021 1050 by Sindy Messing, RN Outcome: Adequate for Discharge 03/27/2021 1050 by Sindy Messing, RN Outcome: Adequate for Discharge Goal: Will remain free from infection 03/27/2021 1050 by Sindy Messing, RN Outcome: Adequate for Discharge 03/27/2021 1050 by Sindy Messing, RN Outcome: Adequate for Discharge Goal: Diagnostic test results will improve 03/27/2021 1050 by Sindy Messing, RN Outcome: Adequate for Discharge 03/27/2021 1050 by Sindy Messing, RN Outcome: Adequate for Discharge Goal: Respiratory complications will improve 03/27/2021 1050 by Sindy Messing, RN Outcome: Adequate for Discharge 03/27/2021 1050 by Sindy Messing, RN Outcome: Adequate for Discharge Goal: Cardiovascular complication will be avoided 03/27/2021 1050 by Sindy Messing, RN Outcome: Adequate for Discharge 03/27/2021 1050 by Sindy Messing, RN Outcome: Adequate for Discharge

## 2021-03-27 NOTE — Discharge Summary (Signed)
Physician Discharge Summary  Tacori Kvamme RKY:[id number] DOB: 14-Sep-1975 DOA: 03/25/2021  PCP: Sonia Side., FNP  Admit date: 03/25/2021 Discharge date: 03/27/2021  Admitted From: home Disposition:  home  Recommendations for Outpatient Follow-up:  Follow up with PCP within 1-2 weeks to monitor BP and respiratory status Follow up with nephrology for dialysis and anemia monitoring  Discharge Condition:stable CODE STATUS:full Diet recommendation: renal  Brief/Interim Summary: Pt presented with shortness of breath with hypoxia likely due to fluid overload. Patient remained stable and respiratory status restored back to baseline with dialysis and increased fluid removal.   Discharge Diagnoses:  Principal Problem:   Fluid overload Active Problems:   Hypertension   ESRD (end stage renal disease) (HCC)   Acute respiratory failure with hypoxia (HCC)   Iron deficiency anemia, unspecified    Allergies as of 03/27/2021       Reactions   Visine [tetrahydrozoline Hcl] Swelling   Eyes Swelling        Medication List     STOP taking these medications    DIALYVITE TABLET Tabs   diphenhydrAMINE 25 MG tablet Commonly known as: BENADRYL   oxyCODONE 5 MG immediate release tablet Commonly known as: Oxy IR/ROXICODONE       TAKE these medications    acetaminophen 325 MG tablet Commonly known as: TYLENOL Take 650 mg by mouth every 6 (six) hours as needed for mild pain or moderate pain (for dialysis treatment).   amLODipine 10 MG tablet Commonly known as: NORVASC Take 1 tablet (10 mg total) by mouth at bedtime.   carvedilol 25 MG tablet Commonly known as: COREG Take 1 tablet (25 mg total) by mouth 2 (two) times daily with a meal. What changed: additional instructions   hydrALAZINE 100 MG tablet Commonly known as: APRESOLINE Take 1 tablet (100 mg total) by mouth 3 (three) times daily.   levETIRAcetam 1000 MG tablet Commonly known as: KEPPRA Take 1 tablet (1,000  mg total) by mouth 2 (two) times daily. What changed: when to take this   SENSIPAR PO Take 1 capsule by mouth See admin instructions. Patient unclear of dose, patient only receives at the end of dialysis.   sevelamer carbonate 800 MG tablet Commonly known as: RENVELA Take 2 tablets (1,600 mg total) by mouth 3 (three) times daily with meals. What changed:  when to take this additional instructions   spironolactone 50 MG tablet Commonly known as: ALDACTONE Take 1 tablet (50 mg total) by mouth daily.        Allergies  Allergen Reactions   Visine [Tetrahydrozoline Hcl] Swelling    Eyes Swelling    Consultations: nephrology  Procedures/Studies: DG Chest Portable 1 View  Result Date: 03/25/2021 CLINICAL DATA:  SOB EXAM: PORTABLE CHEST 1 VIEW COMPARISON:  Multiple priors, most recent August 07, 2020. FINDINGS: Enlarged cardiac silhouette. Probable small bilateral pleural effusions. No visible pneumothorax. Streaky bibasilar opacities. Mild diffuse interstitial prominence. No acute osseous abnormality. IMPRESSION: 1. Cardiomegaly with small bilateral pleural effusions. Mild diffuse interstitial prominence, which could represent the sequela of recurrent bouts of CHF or mild interstitial edema. 2. Mild bibasilar opacities, most likely atelectasis. Pneumonia is not excluded. Electronically Signed   By: Margaretha Sheffield M.D.   On: 03/25/2021 12:49   ECHOCARDIOGRAM COMPLETE  Result Date: 03/26/2021    ECHOCARDIOGRAM REPORT   Patient Name:   Madeline Brown Date of Exam: 03/26/2021 Medical Rec #:  [id number]      Height:       60.0 in Accession #:  [id number]     Weight:       121.3 lb Date of Birth:  11/03/1975       BSA:          1.509 m Patient Age:    45 years       BP:           149/99 mmHg Patient Gender: F              HR:           86 bpm. Exam Location:  Inpatient Procedure: 2D Echo, 3D Echo, Cardiac Doppler and Color Doppler Indications:    R06.02 SOB  History:        Patient has  prior history of Echocardiogram examinations, most                 recent 12/08/2019. Signs/Symptoms:Dyspnea and Shortness of                 Breath; Risk Factors:Hypertension and Current Smoker. ESRD.                 Hypoxia.  Sonographer:    Roseanna Rainbow RDCS Referring Phys: [id number] [place]  1. Left ventricular ejection fraction, by estimation, is 60 to 65%. Left ventricular ejection fraction by 3D volume is 61 %. The left ventricle has normal function. The left ventricle has no regional wall motion abnormalities. There is severe concentric  left ventricular hypertrophy. Left ventricular diastolic parameters are consistent with Grade II diastolic dysfunction (pseudonormalization).  2. Right ventricular systolic function is normal. The right ventricular size is normal. Mildly increased right ventricular wall thickness. There is mildly elevated pulmonary artery systolic pressure. The estimated right ventricular systolic pressure is 48.1 mmHg.  3. Left atrial size was mildly dilated.  4. Right atrial size was moderately dilated.  5. The pericardial effusion is circumferential.  6. The mitral valve is normal in structure. No evidence of mitral valve regurgitation.  7. The aortic valve is tricuspid. Aortic valve regurgitation is not visualized.  8. The inferior vena cava is normal in size with greater than 50% respiratory variability, suggesting right atrial pressure of 3 mmHg. Comparison(s): A prior study was performed on 12/08/19. No significant change from prior study. FINDINGS  Left Ventricle: Left ventricular ejection fraction, by estimation, is 60 to 65%. Left ventricular ejection fraction by 3D volume is 61 %. The left ventricle has normal function. The left ventricle has no regional wall motion abnormalities. The left ventricular internal cavity size was normal in size. There is severe concentric left ventricular hypertrophy. Left ventricular diastolic parameters are consistent with Grade II  diastolic dysfunction (pseudonormalization). Right Ventricle: The right ventricular size is normal. Mildly increased right ventricular wall thickness. Right ventricular systolic function is normal. There is mildly elevated pulmonary artery systolic pressure. The tricuspid regurgitant velocity is 2.69 m/s, and with an assumed right atrial pressure of 8 mmHg, the estimated right ventricular systolic pressure is 85.6 mmHg. Left Atrium: Left atrial size was mildly dilated. Right Atrium: Right atrial size was moderately dilated. Pericardium: Trivial pericardial effusion is present. The pericardial effusion is circumferential. Mitral Valve: The mitral valve is normal in structure. No evidence of mitral valve regurgitation. Tricuspid Valve: The tricuspid valve is normal in structure. Tricuspid valve regurgitation is mild . No evidence of tricuspid stenosis. Aortic Valve: The aortic valve is tricuspid. Aortic valve regurgitation is not visualized. Pulmonic Valve: The pulmonic valve was normal in structure. Pulmonic valve regurgitation is mild.  No evidence of pulmonic stenosis. Aorta: The aortic root and ascending aorta are structurally normal, with no evidence of dilitation. Venous: The inferior vena cava is normal in size with greater than 50% respiratory variability, suggesting right atrial pressure of 3 mmHg. IAS/Shunts: The atrial septum is grossly normal.  LEFT VENTRICLE PLAX 2D LVIDd:         4.30 cm         Diastology LVIDs:         3.00 cm         LV e' medial:    5.66 cm/s LV PW:         1.70 cm         LV E/e' medial:  15.0 LV IVS:        1.60 cm         LV e' lateral:   6.64 cm/s LVOT diam:     2.20 cm         LV E/e' lateral: 12.8 LV SV:         124 LV SV Index:   82 LVOT Area:     3.80 cm        3D Volume EF                                LV 3D EF:    Left                                             ventricul LV Volumes (MOD)                            ar LV vol d, MOD    95.1 ml                    ejection  A2C:                                        fraction LV vol d, MOD    85.1 ml                    by 3D A4C:                                        volume is LV vol s, MOD    38.6 ml                    61 %. A2C: LV vol s, MOD    31.1 ml A4C:                           3D Volume EF: LV SV MOD A2C:   56.5 ml       3D EF:        61 % LV SV MOD A4C:   85.1 ml       LV EDV:       137 ml LV SV MOD BP:    61.1 ml       LV ESV:  53 ml                                LV SV:        83 ml RIGHT VENTRICLE             IVC RV S prime:     14.40 cm/s  IVC diam: 1.10 cm TAPSE (M-mode): 2.0 cm LEFT ATRIUM             Index        RIGHT ATRIUM           Index LA diam:        4.10 cm 2.72 cm/m   RA Area:     21.90 cm LA Vol (A2C):   37.1 ml 24.59 ml/m  RA Volume:   69.40 ml  45.99 ml/m LA Vol (A4C):   66.7 ml 44.20 ml/m LA Biplane Vol: 53.7 ml 35.59 ml/m  AORTIC VALVE              PULMONIC VALVE LVOT Vmax:   169.00 cm/s  PR End Diast Vel: 2.70 msec LVOT Vmean:  109.000 cm/s LVOT VTI:    0.327 m  AORTA Ao Root diam: 3.60 cm Ao Asc diam:  3.8 cm MITRAL VALVE                TRICUSPID VALVE MV Area (PHT): 3.21 cm     TR Peak grad:   28.9 mmHg MV Decel Time: 236 msec     TR Vmax:        269.00 cm/s MV E velocity: 84.80 cm/s MV A velocity: 118.00 cm/s  SHUNTS MV E/A ratio:  0.72         Systemic VTI:  0.33 m                             Systemic Diam: 2.20 cm Rudean Haskell MD Electronically signed by Rudean Haskell MD Signature Date/Time: 03/26/2021/4:11:49 PM    Final     Subjective: Patient feels back to baseline. She has no respiratory complaints. She feels ready to go home.  Discharge Exam: Vitals:   03/27/21 0010 03/27/21 0753  BP: (!) 140/92 125/85  Pulse: 82 67  Resp: 15 16  Temp: 98.1 F (36.7 C) 98 F (36.7 C)  SpO2: 99% 99%    General: Pt is alert, awake, not in acute distress Cardiovascular: RRR, S1/S2 +, no rubs, no gallops Respiratory: CTA bilaterally, no wheezing, no rhonchi Abdominal:  Soft, NT, ND, bowel sounds + Extremities: no edema, no cyanosis  Labs: Basic Metabolic Panel: Recent Labs  Lab 03/25/21 1330 03/26/21 1430  NA 138 134*  K 3.4* 4.0  CL 100 97*  CO2 26 25  GLUCOSE 101* 108*  BUN 45* 58*  CREATININE 8.44* 9.18*  CALCIUM 9.6 9.1  PHOS  --  4.4   CBC: Recent Labs  Lab 03/25/21 0742 03/26/21 1430  WBC 3.7* 3.6*  NEUTROABS 2.5  --   HGB 8.5* 7.7*  HCT 26.2* 23.4*  MCV 105.6* 102.2*  PLT 73* 75*    Microbiology Recent Results (from the past 240 hour(s))  Resp Panel by RT-PCR (Flu A&B, Covid) Nasopharyngeal Swab     Status: None   Collection Time: 03/25/21 11:48 AM   Specimen: Nasopharyngeal Swab; Nasopharyngeal(NP) swabs in vial transport medium  Result Value Ref Range Status   SARS  Coronavirus 2 by RT PCR NEGATIVE NEGATIVE Final    Comment: (NOTE) SARS-CoV-2 target nucleic acids are NOT DETECTED.  The SARS-CoV-2 RNA is generally detectable in upper respiratory specimens during the acute phase of infection. The lowest concentration of SARS-CoV-2 viral copies this assay can detect is 138 copies/mL. A negative result does not preclude SARS-Cov-2 infection and should not be used as the sole basis for treatment or other patient management decisions. A negative result may occur with  improper specimen collection/handling, submission of specimen other than nasopharyngeal swab, presence of viral mutation(s) within the areas targeted by this assay, and inadequate number of viral copies(<138 copies/mL). A negative result must be combined with clinical observations, patient history, and epidemiological information. The expected result is Negative.  Fact Sheet for Patients:  EntrepreneurPulse.com.au  Fact Sheet for Healthcare Providers:  IncredibleEmployment.be  This test is no t yet approved or cleared by the [place] FDA and  has been authorized for detection and/or diagnosis of SARS-CoV-2 by FDA  under an Emergency Use Authorization (EUA). This EUA will remain  in effect (meaning this test can be used) for the duration of the COVID-19 declaration under Section 564(b)(1) of the Act, 21 U.S.C.section 360bbb-3(b)(1), unless the authorization is terminated  or revoked sooner.       Influenza A by PCR NEGATIVE NEGATIVE Final   Influenza B by PCR NEGATIVE NEGATIVE Final    Comment: (NOTE) The Xpert Xpress SARS-CoV-2/FLU/RSV plus assay is intended as an aid in the diagnosis of influenza from Nasopharyngeal swab specimens and should not be used as a sole basis for treatment. Nasal washings and aspirates are unacceptable for Xpert Xpress SARS-CoV-2/FLU/RSV testing.  Fact Sheet for Patients: EntrepreneurPulse.com.au  Fact Sheet for Healthcare Providers: IncredibleEmployment.be  This test is not yet approved or cleared by the [place] FDA and has been authorized for detection and/or diagnosis of SARS-CoV-2 by FDA under an Emergency Use Authorization (EUA). This EUA will remain in effect (meaning this test can be used) for the duration of the COVID-19 declaration under Section 564(b)(1) of the Act, 21 U.S.C. section 360bbb-3(b)(1), unless the authorization is terminated or revoked.  Performed at [facility], [place] [address], [place], [place] [address]     Time coordinating discharge: Over 30 minutes  Richarda Osmond, MD  [facility] 03/27/2021, 8:20 AM Pager   If 7PM-7AM, please contact night-coverage [email or web address] Password [email or web address]

## 2021-03-27 NOTE — Progress Notes (Signed)
  [facility] Progress Note   Subjective:   Completed dialysis yesterday net UF 5.8L. Post wt not recorded.  Feels much better. SOB resolved. For discharge today   Objective Vitals:   03/26/21 1914 03/26/21 2016 03/27/21 0010 03/27/21 0753  BP: (!) 141/92 130/88 (!) 140/92 125/85  Pulse: 72 81 82 67  Resp: (!) 22 15 15 16   Temp:  98.3 F (36.8 C) 98.1 F (36.7 C) 98 F (36.7 C)  TempSrc:  Oral Oral Oral  SpO2:  100% 99% 99%  Weight:      Height:         Additional Objective Labs: Basic Metabolic Panel: Recent Labs  Lab 03/25/21 1330 03/26/21 1430  NA 138 134*  K 3.4* 4.0  CL 100 97*  CO2 26 25  GLUCOSE 101* 108*  BUN 45* 58*  CREATININE 8.44* 9.18*  CALCIUM 9.6 9.1  PHOS  --  4.4   CBC: Recent Labs  Lab 03/25/21 0742 03/26/21 1430  WBC 3.7* 3.6*  NEUTROABS 2.5  --   HGB 8.5* 7.7*  HCT 26.2* 23.4*  MCV 105.6* 102.2*  PLT 73* 75*   Blood Culture    Component Value Date/Time   SDES BLOOD RIGHT WRIST 08/07/2020 0044   SPECREQUEST  08/07/2020 0044    BOTTLES DRAWN AEROBIC AND ANAEROBIC Blood Culture adequate volume   CULT  08/07/2020 0044    NO GROWTH 5 DAYS Performed at [facility], [place] [address] [place], [place] [address]    REPTSTATUS 08/12/2020 FINAL 08/07/2020 0044     Physical Exam General: Well appearing, nad  Heart: RRR Lungs: Clear, bilaterally  Abdomen: soft non-tender  Extremities: No LE edema  Dialysis Access: LUE AVF +bruit   Medications:   amLODipine  10 mg Oral QHS   carvedilol  25 mg Oral BID WC   Chlorhexidine Gluconate Cloth  6 each Topical Q0600   cinacalcet  30 mg Oral Q T,Th,Sat-1800   heparin  5,000 Units Subcutaneous Q8H   hydrALAZINE  100 mg Oral TID   levETIRAcetam  1,000 mg Oral q AM   sevelamer carbonate  1,600 mg Oral TID WC   sevelamer carbonate  800 mg Oral With snacks   sodium chloride flush  3 mL Intravenous Q12H   spironolactone  50 mg Oral Daily    Dialysis Orders:  [facility]  3h 96min 350/A1.5x  EDW 55.7 kg 2K/2Ca AVF No heparin  -Sensipar 30 TIW  -Venofer 50 q wk (last 10/4)  -Mircera 100 q 2 wks (last 9/29)      Assessment/Plan: Dyspnea - Progressive. Suspect this is due excess volume related to loss of body weight. Plan to lower her outpatient dry weight at discharge.  net UF 5.8L with HD 10/11. New dry weight 52-53 kg   ESRD -  HD TTS. Continue on schedule Next HD 10/13   Hypertension- Blood pressure elevated on admission. Should improve with UF on HD today. Follow   Anemia  -  On ESA/weekly Fe as outpatient. Next ESA due 10/13.   Metabolic bone disease -  Continue home binders, Sensipar while here   Nutrition - Renal diet/fluid restriction.   Lynnda Child PA-C [facility] 03/27/2021,9:35 AM

## 2021-05-02 DIAGNOSIS — R0902 Hypoxemia: Secondary | ICD-10-CM

## 2021-05-29 ENCOUNTER — Ambulatory Visit: Payer: Medicare HMO | Admitting: Adult Health

## 2021-07-18 ENCOUNTER — Encounter: Payer: Self-pay | Admitting: Adult Health

## 2021-07-18 ENCOUNTER — Telehealth: Payer: Self-pay

## 2021-07-18 ENCOUNTER — Ambulatory Visit: Payer: Medicare HMO | Admitting: Adult Health

## 2021-07-18 NOTE — Telephone Encounter (Signed)
Per Janett Billow last NS note 10/29/20, "Patient no showed for todays scheduled appointment which was required for additional medication refills. This is the 2nd no show visit since her prior visit 1 year ago and had multiple no-shows in the past. As she is on seizure medication, I will allow 1 additional rescheduled visit but if she no shows again, she will have to be discharged from our office"  Pt has no showed today's appointment as well and needs to be discharged from our office.

## 2021-07-22 ENCOUNTER — Encounter: Payer: Self-pay | Admitting: Adult Health

## 2021-07-28 ENCOUNTER — Emergency Department (HOSPITAL_COMMUNITY): Payer: Medicare HMO

## 2021-07-28 ENCOUNTER — Observation Stay (HOSPITAL_COMMUNITY)
Admission: EM | Admit: 2021-07-28 | Discharge: 2021-07-29 | Disposition: A | Discharge status: Home / Self Care | Payer: Medicare HMO | Attending: Internal Medicine | Admitting: Internal Medicine

## 2021-07-28 ENCOUNTER — Other Ambulatory Visit: Payer: Self-pay

## 2021-07-28 ENCOUNTER — Encounter (HOSPITAL_COMMUNITY): Payer: Self-pay

## 2021-07-28 DIAGNOSIS — Z992 Dependence on renal dialysis: Secondary | ICD-10-CM | POA: Diagnosis present

## 2021-07-28 DIAGNOSIS — J810 Acute pulmonary edema: Secondary | ICD-10-CM

## 2021-07-28 DIAGNOSIS — I120 Hypertensive chronic kidney disease with stage 5 chronic kidney disease or end stage renal disease: Secondary | ICD-10-CM | POA: Diagnosis not present

## 2021-07-28 DIAGNOSIS — I10 Essential (primary) hypertension: Secondary | ICD-10-CM | POA: Diagnosis not present

## 2021-07-28 DIAGNOSIS — R569 Unspecified convulsions: Secondary | ICD-10-CM

## 2021-07-28 DIAGNOSIS — Z79899 Other long term (current) drug therapy: Secondary | ICD-10-CM | POA: Diagnosis not present

## 2021-07-28 DIAGNOSIS — J9601 Acute respiratory failure with hypoxia: Principal | ICD-10-CM | POA: Insufficient documentation

## 2021-07-28 DIAGNOSIS — N186 End stage renal disease: Secondary | ICD-10-CM | POA: Diagnosis not present

## 2021-07-28 DIAGNOSIS — E8779 Other fluid overload: Secondary | ICD-10-CM | POA: Diagnosis not present

## 2021-07-28 DIAGNOSIS — Z20822 Contact with and (suspected) exposure to covid-19: Secondary | ICD-10-CM | POA: Insufficient documentation

## 2021-07-28 DIAGNOSIS — R0902 Hypoxemia: Secondary | ICD-10-CM

## 2021-07-28 DIAGNOSIS — E8770 Fluid overload, unspecified: Secondary | ICD-10-CM | POA: Diagnosis present

## 2021-07-28 DIAGNOSIS — R0602 Shortness of breath: Secondary | ICD-10-CM | POA: Diagnosis present

## 2021-07-28 DIAGNOSIS — G40909 Epilepsy, unspecified, not intractable, without status epilepticus: Secondary | ICD-10-CM | POA: Diagnosis not present

## 2021-07-28 DIAGNOSIS — D631 Anemia in chronic kidney disease: Secondary | ICD-10-CM | POA: Diagnosis present

## 2021-07-28 DIAGNOSIS — D638 Anemia in other chronic diseases classified elsewhere: Secondary | ICD-10-CM | POA: Diagnosis present

## 2021-07-28 DIAGNOSIS — D61818 Other pancytopenia: Secondary | ICD-10-CM | POA: Diagnosis present

## 2021-07-28 DIAGNOSIS — N189 Chronic kidney disease, unspecified: Secondary | ICD-10-CM | POA: Diagnosis present

## 2021-07-28 DIAGNOSIS — J9621 Acute and chronic respiratory failure with hypoxia: Secondary | ICD-10-CM | POA: Diagnosis present

## 2021-07-28 LAB — CBC WITH DIFFERENTIAL/PLATELET
Abs Immature Granulocytes: 0.01 10*3/uL (ref 0.00–0.07)
Basophils Absolute: 0 10*3/uL (ref 0.0–0.1)
Basophils Relative: 1 %
Eosinophils Absolute: 0.1 10*3/uL (ref 0.0–0.5)
Eosinophils Relative: 3 %
HCT: 27.1 % — ABNORMAL LOW (ref 36.0–46.0)
Hemoglobin: 8.3 g/dL — ABNORMAL LOW (ref 12.0–15.0)
Immature Granulocytes: 0 %
Lymphocytes Relative: 20 %
Lymphs Abs: 0.8 10*3/uL (ref 0.7–4.0)
MCH: 33.9 pg (ref 26.0–34.0)
MCHC: 30.6 g/dL (ref 30.0–36.0)
MCV: 110.6 fL — ABNORMAL HIGH (ref 80.0–100.0)
Monocytes Absolute: 0.2 10*3/uL (ref 0.1–1.0)
Monocytes Relative: 6 %
Neutro Abs: 2.8 10*3/uL (ref 1.7–7.7)
Neutrophils Relative %: 70 %
Platelets: 62 10*3/uL — ABNORMAL LOW (ref 150–400)
RBC: 2.45 MIL/uL — ABNORMAL LOW (ref 3.87–5.11)
RDW: 20 % — ABNORMAL HIGH (ref 11.5–15.5)
WBC: 3.9 10*3/uL — ABNORMAL LOW (ref 4.0–10.5)
nRBC: 0 % (ref 0.0–0.2)

## 2021-07-28 LAB — COMPREHENSIVE METABOLIC PANEL
ALT: 11 U/L (ref 0–44)
AST: 15 U/L (ref 15–41)
Albumin: 3.6 g/dL (ref 3.5–5.0)
Alkaline Phosphatase: 39 U/L (ref 38–126)
Anion gap: 12 (ref 5–15)
BUN: 34 mg/dL — ABNORMAL HIGH (ref 6–20)
CO2: 30 mmol/L (ref 22–32)
Calcium: 9.6 mg/dL (ref 8.9–10.3)
Chloride: 96 mmol/L — ABNORMAL LOW (ref 98–111)
Creatinine, Ser: 7.24 mg/dL — ABNORMAL HIGH (ref 0.44–1.00)
GFR, Estimated: 7 mL/min — ABNORMAL LOW (ref >60)
Glucose, Bld: 111 mg/dL — ABNORMAL HIGH (ref 70–99)
Potassium: 3.7 mmol/L (ref 3.5–5.1)
Sodium: 138 mmol/L (ref 135–145)
Total Bilirubin: 1.8 mg/dL — ABNORMAL HIGH (ref 0.3–1.2)
Total Protein: 7 g/dL (ref 6.5–8.1)

## 2021-07-28 LAB — TROPONIN I (HIGH SENSITIVITY): Troponin I (High Sensitivity): 19 ng/L — ABNORMAL HIGH (ref <18)

## 2021-07-28 LAB — RESP PANEL BY RT-PCR (FLU A&B, COVID) ARPGX2
Influenza A by PCR: NEGATIVE
Influenza B by PCR: NEGATIVE
SARS Coronavirus 2 by RT PCR: NEGATIVE

## 2021-07-28 MED ORDER — CHLORHEXIDINE GLUCONATE CLOTH 2 % EX PADS: 6.0000 | MEDICATED_PAD | Freq: Every day | CUTANEOUS | Status: DC

## 2021-07-28 NOTE — ED Triage Notes (Signed)
Pt BIB [facility] from home c/o [facility] for the last 5-7 days. 85% RA, pt placed on 3L [place] with sats >95%. Goes to dialysis T, TH, S and have not missed any days. Recently diagnosed anemic.

## 2021-07-28 NOTE — ED Provider Notes (Signed)
[facility] EMERGENCY DEPARTMENT Provider Note   CSN: [id number] Arrival date & time: 07/28/21  2043     History  Chief Complaint  Patient presents with   Shortness of Breath    Madeline Brown is a 46 y.o. female.  Patient is a 46 year old female with a history of hypertension, seizures, end-stage renal disease on dialysis Tuesday Thursday and Saturday who is presenting today with complaints of shortness of breath.  Patient reports that she has been compliant with her dialysis and had her last dialysis treatment yesterday.  She reports that earlier this week she had an episode of feeling breathless and needed to use her home oxygen for a few hours but reported after waking up she felt better and she has been at work.  Today when she woke up she felt okay and went to work but around 1045 this morning she started feeling very short of breath and went home.  She reports she got on her oxygen for a while and felt better she then went to take a shower and started feeling much more short of breath and at that time called 911.  She has had a minor cough but denies any productive sputum or fever.  She has not noticed any weight gain and reports when she finished dialysis yesterday she was at her dry weight.  She denies any history of tobacco use or known lung disease and does not use inhalers at home.  She reported she has the oxygen at home because last year she had pneumonia and they sent her home with that but she does not use it regularly.  She does report that this week either on Tuesday or Thursday they told her that her hemoglobin was low and gave her a shot of something earlier this past week to help with her blood counts.  She has not received a blood transfusion.  She reports right now on 4 L of oxygen she feels like her breathing is okay but she did notice when she was laid down to move over and made her feel more short of breath.  Until now she has not had orthopnea and had  only noticed shortness of breath with exertion.  The history is provided by the patient.  Shortness of Breath     Home Medications Prior to Admission medications   Medication Sig Start Date End Date Taking? Authorizing Provider  acetaminophen (TYLENOL) 325 MG tablet Take 650 mg by mouth every 6 (six) hours as needed for mild pain or moderate pain (for dialysis treatment).    [provider]  amLODipine (NORVASC) 10 MG tablet Take 1 tablet (10 mg total) by mouth at bedtime. 12/09/19 01/29/22  Harvie Heck, MD  carvedilol (COREG) 25 MG tablet Take 1 tablet (25 mg total) by mouth 2 (two) times daily with a meal. Patient taking differently: Take 25 mg by mouth 2 (two) times daily with a meal. Patient states they take on Tuesdays, Thursdays, and Saturdays at 1100 and 2000 Take on Sundays, Mondays, Wednesdays, and Fridays at 0630 and 1800 12/09/19 01/29/22  Harvie Heck, MD  Cinacalcet HCl (SENSIPAR PO) Take 1 capsule by mouth See admin instructions. Patient unclear of dose, patient only receives at the end of dialysis. 12/13/20 12/12/21  [provider]  hydrALAZINE (APRESOLINE) 100 MG tablet Take 1 tablet (100 mg total) by mouth 3 (three) times daily. 12/09/19 01/30/23  Harvie Heck, MD  levETIRAcetam (KEPPRA) 1000 MG tablet Take 1 tablet (1,000 mg total)  by mouth 2 (two) times daily. Patient taking differently: Take 1,000 mg by mouth in the morning. 12/07/20   Penumalli, Earlean Polka, MD  sevelamer carbonate (RENVELA) 800 MG tablet Take 2 tablets (1,600 mg total) by mouth 3 (three) times daily with meals. Patient taking differently: Take 1,600 mg by mouth See admin instructions. Take 2 tablets (1600 mg) by mouth 3 times daily with meals & take 1 tablet (800 mg) by mouth with snacks 02/21/19   Madalyn Rob, MD  spironolactone (ALDACTONE) 50 MG tablet Take 1 tablet (50 mg total) by mouth daily. 12/09/19   Harvie Heck, MD      Allergies    Visine [tetrahydrozoline hcl]    Review of Systems    Review of Systems  Respiratory:  Positive for shortness of breath.    Physical Exam Updated Vital Signs BP (!) 149/100    Pulse 98    Temp 99.6 F (37.6 C) (Oral)    Resp (!) 23    Ht 5' (1.524 m)    Wt 54.5 kg    LMP 01/06/2018    SpO2 96%    BMI 23.47 kg/m  Physical Exam Vitals and nursing note reviewed.  Constitutional:      General: She is not in acute distress.    Appearance: She is well-developed.  HENT:     Head: Normocephalic and atraumatic.     Mouth/Throat:     Mouth: Mucous membranes are moist.  Eyes:     Conjunctiva/sclera: Conjunctivae normal.     Pupils: Pupils are equal, round, and reactive to light.  Cardiovascular:     Rate and Rhythm: Regular rhythm. Tachycardia present.     Heart sounds: Murmur heard.  Systolic murmur is present with a grade of 3/6.  Pulmonary:     Effort: Pulmonary effort is normal. No respiratory distress.     Breath sounds: Rales present. No wheezing.  Abdominal:     General: There is no distension.     Palpations: Abdomen is soft.     Tenderness: There is no abdominal tenderness. There is no right CVA tenderness, left CVA tenderness, guarding or rebound.  Musculoskeletal:        General: No tenderness. Normal range of motion.     Cervical back: Normal range of motion and neck supple.     Right lower leg: No edema.     Left lower leg: No edema.  Skin:    General: Skin is warm and dry.     Findings: No erythema or rash.  Neurological:     Mental Status: She is alert and oriented to person, place, and time. Mental status is at baseline.  Psychiatric:        Mood and Affect: Mood normal.        Behavior: Behavior normal.    ED Results / Procedures / Treatments   Labs (all labs ordered are listed, but only abnormal results are displayed) Labs Reviewed  CBC WITH DIFFERENTIAL/PLATELET - Abnormal; Notable for the following components:      Result Value   WBC 3.9 (*)    RBC 2.45 (*)    Hemoglobin 8.3 (*)    HCT 27.1 (*)    MCV  110.6 (*)    RDW 20.0 (*)    Platelets 62 (*)    All other components within normal limits  COMPREHENSIVE METABOLIC PANEL - Abnormal; Notable for the following components:   Chloride 96 (*)    Glucose, Bld 111 (*)  BUN 34 (*)    Creatinine, Ser 7.24 (*)    Total Bilirubin 1.8 (*)    GFR, Estimated 7 (*)    All other components within normal limits  TROPONIN I (HIGH SENSITIVITY) - Abnormal; Notable for the following components:   Troponin I (High Sensitivity) 19 (*)    All other components within normal limits  RESP PANEL BY RT-PCR (FLU A&B, COVID) [id number]    EKG EKG Interpretation  Date/Time:  Sunday July 28 2021 20:54:39 EST Ventricular Rate:  97 PR Interval:  229 QRS Duration: 83 QT Interval:  351 QTC Calculation: 446 R Axis:   76 Text Interpretation: Sinus tachycardia Ventricular premature complex Prolonged PR interval Left atrial enlargement Abnormal R-wave progression, early transition No significant change since last tracing Confirmed by Blanchie Dessert ([id number]) on 07/28/2021 9:16:06 PM  Radiology DG Chest Port 1 View  Result Date: 07/28/2021 CLINICAL DATA:  Increased shortness of breath and hypoxia. EXAM: PORTABLE CHEST 1 VIEW COMPARISON:  Portable chest 03/25/2021 FINDINGS: There are numerous overlying monitor wires. Moderate to severe cardiomegaly again noted with again seen central vascular distension and flow cephalization and mild interstitial edema in the bases. There are bilateral minimal pleural effusions which were noted previously as well as the interstitial edema. Today there is also increased patchy airspace disease in the right lower lung field concerning for pneumonia versus asymmetric edema. The remaining lungs are clear of focal opacities. There is a stable mediastinal configuration. Mild thoracic dextroscoliosis. IMPRESSION: 1. Cardiomegaly with perihilar vascular prominence and mild interstitial edema. This could be from CHF or fluid overload and is  similar to the last study as well as minimal pleural effusions. 2. Increased opacity in the right lower lung field concerning for superimposed pneumonia and / or asymmetric alveolar edema. Electronically Signed   By: Telford Nab M.D.   On: 07/28/2021 21:34    Procedures Procedures    Medications Ordered in ED Medications - No data to display  ED Course/ Medical Decision Making/ A&P                           Medical Decision Making Amount and/or Complexity of Data Reviewed Independent Historian: EMS External Data Reviewed: labs and notes. Labs: ordered. Decision-making details documented in ED Course. Radiology: ordered and independent interpretation performed. Decision-making details documented in ED Course. ECG/medicine tests: ordered and independent interpretation performed. Decision-making details documented in ED Course.  Risk Decision regarding hospitalization.   Patient is a 46 year old female with multiple medical problems and comorbidities presenting today with shortness of breath.  She was noted to be satting 85% on room air and was placed on 4 L.  Normally patient does not wear oxygen but does have some at home to use as needed.  She denies infectious symptoms and has no prior history of blood clots.  She does have end-stage renal disease and had her last dialysis treatment on Saturday and has been compliant with her dialysis and has not missed.  She today does have rales diffusely and concern for pneumonia versus fluid overload as well as anemia.  She currently denies feeling short of breath on 4 L sitting upright in bed and is in no acute distress at this time.  Patient is on continuous cardiac monitoring currently in a sinus rhythm with no dysrhythmia and oxygen saturation of 95%.  Labs and imaging are pending.  External medical records reviewed and patient currently seeing transplant physicians and having  lab work done to get her on the transplant list.  10:22 PM I  independently evaluated patient's EKG, x-ray and labs.  Patient's chest x-ray today with evidence of cardiomegaly and pulmonary edema.  Radiology reports possible pneumonia versus atypical alveolar edema.  Patient's EKG shows no acute changes.  Bedside ultrasound to evaluate for pericardial effusion did not show a large effusion.  Patient remains comfortable on 4 L of oxygen with sats at 98%.  CBC with persistent leukopenia of 3.9 and thrombocytopenia in the 60s, hemoglobin of 8.3 which is at baseline.  COVID and flu are negative.  CMP with normal potassium and otherwise findings consistent with end-stage renal disease.  Troponin mildly elevated at 19 and improved since prior tracing.  Spoke with Dr. Soyla Murphy with nephrology and feel that patient will need more dialysis as she is currently requiring oxygen for hypoxia.  He will plan on dialyzing her in the morning.  Will admit to the hospitalist service.  Patient meets admission criteria at this time.  Low suspicion for pneumonia at this time.  Findings discussed with the patient and she is comfortable with this plan.        Final Clinical Impression(s) / ED Diagnoses Final diagnoses:  Acute pulmonary edema ([place])  ESRD on dialysis [facility])  Hypoxia    Rx / DC Orders ED Discharge Orders     None         Blanchie Dessert, MD 07/28/21 2232

## 2021-07-28 NOTE — Consult Note (Signed)
Renal Service Consult Note [facility]  Madeline Brown 07/28/2021 Sol Blazing, MD Requesting Physician: Dr. Maryan Rued  Reason for Consult: ESRD pt w/ SOB, pulm edema HPI: The patient is a 46 y.o. year-old w/ hx of HTN, seizure d/o, ESRD on HD TTS who presented to ED tonight for SOB progressive over past few weeks. Compliant w/ HD and has not missed HD. Came off at dry weight yesterday at HD. Thinks she may need her dry wt lowered. In ED BP 170 /95, RR 20. CXR shows bilat mod severe edema. Has O2 at home from prior hospital stay that she has been using. Pt to be admitted. Asked to see for dialysis.   Pt seen in ED.  On HD x 4 yrs, has been using her old home O2 device for the past few weeks. States that in Jan she lost her appetite and may have lost some body wt. No CP, no fevers, no abd pain or n/v/d.    ROS - denies CP, no joint pain, no HA, no blurry vision, no rash, no diarrhea, no nausea/ vomiting, no dysuria, no difficulty voiding   Past Medical History  Past Medical History:  Diagnosis Date   Anemia    Chronic kidney disease    Headache, unspecified 09/26/2017   History of blood transfusion 07/2020   Hypertension    Seizure ([place]) 02/2019   Shortness of breath 07/15/2018   01/16/21 not currently   Stroke [facility])    Mild was told after having a seizure   Past Surgical History  Past Surgical History:  Procedure Laterality Date   AV FISTULA PLACEMENT Left 06/24/2017   Procedure: CREATION OF LEFT ARM BRACHIALCEPHALIC  ARTERIOVENOUS (AV) FISTULA;  Surgeon: Rosetta Posner, MD;  Location: [place];  Service: Vascular;  Laterality: Left;   CESAREAN SECTION     x2   COMPLEX WOUND CLOSURE Left 07/30/2020   Procedure: COMPLEX WOUND CLOSURE;  Surgeon: Cherre Robins, MD;  Location: [place];  Service: Vascular;  Laterality: Left;   FISTULA SUPERFICIALIZATION Left 10/28/2017   Procedure: SUPERFICIALIZATION LEFT BRACHIOCEPHALIC ARTERIOVENOUS FISTULA;  Surgeon: Rosetta Posner, MD;   Location: [place];  Service: Vascular;  Laterality: Left;   INGUINAL HERNIA REPAIR Bilateral 02/01/2021   Procedure: LAPAROSCOPIC BILATERAL INGUINAL HERNIA REPAIR WITH MESH;  Surgeon: Kinsinger, Arta Bruce, MD;  Location: [facility];  Service: General;  Laterality: Bilateral;   INSERTION OF DIALYSIS CATHETER Right 06/24/2017   Procedure: INSERTION OF TUNNELED DIALYSIS CATHETER;  Surgeon: Rosetta Posner, MD;  Location: [place];  Service: Vascular;  Laterality: Right;   INSERTION OF DIALYSIS CATHETER Right 07/30/2020   Procedure: INSERTION OF DIALYSIS CATHETER;  Surgeon: Cherre Robins, MD;  Location: [facility];  Service: Vascular;  Laterality: Right;   LIGATION OF COMPETING BRANCHES OF ARTERIOVENOUS FISTULA  10/28/2017   Procedure: LIGATION OF COMPETING BRANCHES OF ARTERIOVENOUS FISTULA x3;  Surgeon: Rosetta Posner, MD;  Location: [facility];  Service: Vascular;;   REVISON OF ARTERIOVENOUS FISTULA Left 07/30/2020   Procedure: LEFT UPPER EXTREMITY ARTERIOVENOUS FISTULA REVISON;  Surgeon: Cherre Robins, MD;  Location: [facility];  Service: Vascular;  Laterality: Left;  PERIPHERAL NERVE BLOCK   Family History  Family History  Problem Relation Age of Onset   Diabetes Father    Hypertension Father    Cancer Father    Heart disease Father    Aneurysm Mother    Seizures Mother    Social History  reports that she has  been smoking cigarettes. She has a 3.75 pack-year smoking history. She has never used smokeless tobacco. She reports that she does not currently use alcohol. She reports current drug use. Drug: Marijuana. Allergies  Allergies  Allergen Reactions   Visine [Tetrahydrozoline Hcl] Swelling    Eyes Swelling   Home medications Prior to Admission medications   Medication Sig Start Date End Date Taking? Authorizing Provider  acetaminophen (TYLENOL) 325 MG tablet Take 650 mg by mouth every 6 (six) hours as needed for mild pain or moderate pain (for dialysis treatment).    [provider]  amLODipine  (NORVASC) 10 MG tablet Take 1 tablet (10 mg total) by mouth at bedtime. 12/09/19 01/29/22  Harvie Heck, MD  carvedilol (COREG) 25 MG tablet Take 1 tablet (25 mg total) by mouth 2 (two) times daily with a meal. Patient taking differently: Take 25 mg by mouth 2 (two) times daily with a meal. Patient states they take on Tuesdays, Thursdays, and Saturdays at 1100 and 2000 Take on Sundays, Mondays, Wednesdays, and Fridays at 0630 and 1800 12/09/19 01/29/22  Harvie Heck, MD  Cinacalcet HCl (SENSIPAR PO) Take 1 capsule by mouth See admin instructions. Patient unclear of dose, patient only receives at the end of dialysis. 12/13/20 12/12/21  [provider]  hydrALAZINE (APRESOLINE) 100 MG tablet Take 1 tablet (100 mg total) by mouth 3 (three) times daily. 12/09/19 01/30/23  Harvie Heck, MD  levETIRAcetam (KEPPRA) 1000 MG tablet Take 1 tablet (1,000 mg total) by mouth 2 (two) times daily. Patient taking differently: Take 1,000 mg by mouth in the morning. 12/07/20   Penumalli, Earlean Polka, MD  sevelamer carbonate (RENVELA) 800 MG tablet Take 2 tablets (1,600 mg total) by mouth 3 (three) times daily with meals. Patient taking differently: Take 1,600 mg by mouth See admin instructions. Take 2 tablets (1600 mg) by mouth 3 times daily with meals & take 1 tablet (800 mg) by mouth with snacks 02/21/19   Madalyn Rob, MD  spironolactone (ALDACTONE) 50 MG tablet Take 1 tablet (50 mg total) by mouth daily. 12/09/19   Harvie Heck, MD     Vitals:   07/28/21 2100 07/28/21 2130 07/28/21 2200 07/28/21 2215  BP:  (!) 149/100 (!) 152/100 (!) 162/105  Pulse: 97 98 93 89  Resp: 18 (!) 23 (!) 26 (!) 35  Temp:      TempSrc:      SpO2: 95% 96% 93% 98%  Weight:      Height:       Exam Gen alert, no distress No rash, cyanosis or gangrene Sclera anicteric, throat clear  No jvd or bruits Chest occ scattered crackles bilat bases, no wheezing RRR no MRG Abd soft ntnd no mass or ascites +bs GU defer MS no joint effusions  or deformity Ext no LE or UE edema, no wounds or ulcers Neuro is alert, Ox 3 , nf   LUE AVF+ bruit      Home meds include - norvasc 10, coreg 25mg , sensipar, hydralzine 100 tid, keppra, renvela 2 ac tid, aldactone 50 qd, prns    Na 138  K 3.7  BUN 34  Cr 7.24 Alb 3.6  WBC 3.9  Hb 8.3  plt 62    OP HD: [facility]  3h 3min    350/1.5x   Hep none  (needs updating)   Assessment/ Plan: SOB / pulm edema - suspect vol overload from lean body wt loss in esrd pt. CXR w/ diffuse edema. Stable on [place]  O2 in ED.  Will plan for HD 1st shift in am tomorrow off schedule, lower dry wt as tolerated.  ESRD - usual HD is TTS.  HD tomorrow off schedule as above.  HTN - cont BP meds Seizure d/o - on keppra MBD ckd - cont meds Anemia ckd - Hb 8s Hx of CVA Thrombocytopenia - last plt count 75 in Oct 2022       Rob Wyland Rastetter  MD 07/28/2021, 10:46 PM  Recent Labs  Lab 07/28/21 2121  WBC 3.9*  HGB 8.3*   Recent Labs  Lab 07/28/21 2121  K 3.7  BUN 34*  CREATININE 7.24*  ALBUMIN 3.6  CALCIUM 9.6

## 2021-07-29 DIAGNOSIS — I10 Essential (primary) hypertension: Secondary | ICD-10-CM | POA: Diagnosis not present

## 2021-07-29 DIAGNOSIS — N186 End stage renal disease: Secondary | ICD-10-CM | POA: Diagnosis not present

## 2021-07-29 DIAGNOSIS — Z992 Dependence on renal dialysis: Secondary | ICD-10-CM

## 2021-07-29 DIAGNOSIS — D631 Anemia in chronic kidney disease: Secondary | ICD-10-CM

## 2021-07-29 DIAGNOSIS — J9621 Acute and chronic respiratory failure with hypoxia: Secondary | ICD-10-CM | POA: Diagnosis not present

## 2021-07-29 DIAGNOSIS — D61818 Other pancytopenia: Secondary | ICD-10-CM | POA: Diagnosis present

## 2021-07-29 DIAGNOSIS — R569 Unspecified convulsions: Secondary | ICD-10-CM

## 2021-07-29 DIAGNOSIS — J9601 Acute respiratory failure with hypoxia: Secondary | ICD-10-CM | POA: Diagnosis not present

## 2021-07-29 DIAGNOSIS — E8779 Other fluid overload: Secondary | ICD-10-CM

## 2021-07-29 DIAGNOSIS — E8770 Fluid overload, unspecified: Secondary | ICD-10-CM | POA: Diagnosis present

## 2021-07-29 LAB — BASIC METABOLIC PANEL
Anion gap: 11 (ref 5–15)
BUN: 39 mg/dL — ABNORMAL HIGH (ref 6–20)
CO2: 27 mmol/L (ref 22–32)
Calcium: 9.1 mg/dL (ref 8.9–10.3)
Chloride: 99 mmol/L (ref 98–111)
Creatinine, Ser: 7.46 mg/dL — ABNORMAL HIGH (ref 0.44–1.00)
GFR, Estimated: 6 mL/min — ABNORMAL LOW (ref >60)
Glucose, Bld: 113 mg/dL — ABNORMAL HIGH (ref 70–99)
Potassium: 3.7 mmol/L (ref 3.5–5.1)
Sodium: 137 mmol/L (ref 135–145)

## 2021-07-29 LAB — HEPATITIS B SURFACE ANTIBODY,QUALITATIVE: Hep B S Ab: REACTIVE — AB

## 2021-07-29 LAB — RENAL FUNCTION PANEL
Albumin: 3.4 g/dL — ABNORMAL LOW (ref 3.5–5.0)
Anion gap: 10 (ref 5–15)
BUN: 13 mg/dL (ref 6–20)
CO2: 31 mmol/L (ref 22–32)
Calcium: 9.2 mg/dL (ref 8.9–10.3)
Chloride: 98 mmol/L (ref 98–111)
Creatinine, Ser: 3.74 mg/dL — ABNORMAL HIGH (ref 0.44–1.00)
GFR, Estimated: 15 mL/min — ABNORMAL LOW (ref >60)
Glucose, Bld: 116 mg/dL — ABNORMAL HIGH (ref 70–99)
Phosphorus: 2.9 mg/dL (ref 2.5–4.6)
Potassium: 3.4 mmol/L — ABNORMAL LOW (ref 3.5–5.1)
Sodium: 139 mmol/L (ref 135–145)

## 2021-07-29 LAB — CBC WITH DIFFERENTIAL/PLATELET
Abs Immature Granulocytes: 0.01 10*3/uL (ref 0.00–0.07)
Basophils Absolute: 0 10*3/uL (ref 0.0–0.1)
Basophils Relative: 1 %
Eosinophils Absolute: 0.1 10*3/uL (ref 0.0–0.5)
Eosinophils Relative: 3 %
HCT: 24.1 % — ABNORMAL LOW (ref 36.0–46.0)
Hemoglobin: 7.5 g/dL — ABNORMAL LOW (ref 12.0–15.0)
Immature Granulocytes: 0 %
Lymphocytes Relative: 22 %
Lymphs Abs: 0.9 10*3/uL (ref 0.7–4.0)
MCH: 34.6 pg — ABNORMAL HIGH (ref 26.0–34.0)
MCHC: 31.1 g/dL (ref 30.0–36.0)
MCV: 111.1 fL — ABNORMAL HIGH (ref 80.0–100.0)
Monocytes Absolute: 0.3 10*3/uL (ref 0.1–1.0)
Monocytes Relative: 7 %
Neutro Abs: 2.8 10*3/uL (ref 1.7–7.7)
Neutrophils Relative %: 67 %
Platelets: 56 10*3/uL — ABNORMAL LOW (ref 150–400)
RBC: 2.17 MIL/uL — ABNORMAL LOW (ref 3.87–5.11)
RDW: 19.7 % — ABNORMAL HIGH (ref 11.5–15.5)
WBC: 4.1 10*3/uL (ref 4.0–10.5)
nRBC: 0 % (ref 0.0–0.2)

## 2021-07-29 LAB — TROPONIN I (HIGH SENSITIVITY): Troponin I (High Sensitivity): 20 ng/L — ABNORMAL HIGH (ref <18)

## 2021-07-29 LAB — HEPATITIS B SURFACE ANTIGEN: Hepatitis B Surface Ag: NONREACTIVE

## 2021-07-29 MED ORDER — LEVETIRACETAM 500 MG PO TABS
1000.0000 mg | ORAL_TABLET | Freq: Every day | ORAL | Status: DC
  Administered 2021-07-29: 1000 mg via ORAL
  Filled 2021-07-29: qty 2

## 2021-07-29 MED ORDER — AMLODIPINE BESYLATE 5 MG PO TABS
10.0000 mg | ORAL_TABLET | Freq: Every day | ORAL | Status: DC
  Administered 2021-07-29: 10 mg via ORAL
  Filled 2021-07-29: qty 2

## 2021-07-29 MED ORDER — HYDRALAZINE HCL 50 MG PO TABS
100.0000 mg | ORAL_TABLET | Freq: Three times a day (TID) | ORAL | Status: DC
  Administered 2021-07-29: 100 mg via ORAL
  Filled 2021-07-29 (×2): qty 2

## 2021-07-29 MED ORDER — SPIRONOLACTONE 25 MG PO TABS
50.0000 mg | ORAL_TABLET | Freq: Every day | ORAL | Status: DC
  Filled 2021-07-29: qty 2

## 2021-07-29 MED ORDER — CARVEDILOL 3.125 MG PO TABS: 25.0000 mg | ORAL_TABLET | Freq: Two times a day (BID) | ORAL | Status: DC

## 2021-07-29 MED ORDER — SEVELAMER CARBONATE 800 MG PO TABS: 1600.0000 mg | ORAL_TABLET | Freq: Three times a day (TID) | ORAL | Status: DC

## 2021-07-29 MED ORDER — ACETAMINOPHEN 325 MG PO TABS: 650.0000 mg | ORAL_TABLET | Freq: Four times a day (QID) | ORAL | Status: DC | PRN

## 2021-07-29 NOTE — Assessment & Plan Note (Addendum)
No evidence of seizure activity. The patient has some pancytopenia. She should follow up with her neurologist as outpatient as Keppra may be playing a role here.

## 2021-07-29 NOTE — Care Management CC44 (Signed)
Condition Code 44 Documentation Completed  Patient Details  Name: Madeline Brown MRN: [id number] Date of Birth: Sep 07, 1975   Condition Code 44 given:  Yes Patient signature on Condition Code 44 notice:  Yes Documentation of 2 MD's agreement:  Yes Code 44 added to claim:  Yes    Fuller Mandril, RN 07/29/2021, 3:58 PM

## 2021-07-29 NOTE — Progress Notes (Signed)
[facility] Progress Note    Assessment/ Plan:   Assessment/ Plan: SOB / pulm edema - suspect vol overload from lean body wt loss in esrd pt. CXR w/ diffuse edema. Extra HD 2/13 for UF ESRD - usual HD is TTS.  Next HD 2/14, back on TTS sched. Will need new EDW HTN - cont BP meds Seizure d/o - on keppra MBD ckd - cont meds Anemia ckd - Hb 8s, down to 7.5, last mircera dose as an outpatient was 151mcg on 2/4 Hx of CVA Thrombocytopenia - lplt count trending, per primary service   [facility] HD orders: [facility]  3h 48min    350/1.5x, 2k, 2cal, edw 54.5kg   Hep none  (needs updating). Mircera 181mcg q2weeks (last dose 2/4)   Gean Quint, MD [facility]  Subjective:   Patient seen and examined on HD. Tolerating tx, UFG 4L. Patient reports that her shortness of breath has improved. No other complaints   Objective:   BP (!) 149/91    Pulse 84    Temp 97.9 F (36.6 C) (Oral)    Resp (!) 22    Ht 5' (1.524 m)    Wt 54.5 kg    LMP 01/06/2018    SpO2 100%    BMI 23.47 kg/m  No intake or output data in the 24 hours ending 07/29/21 1223 Weight change:   Physical Exam: Gen:nad, comfortable appearing CVS: s1s2, rrr Resp:diminished air entry bibasilar, normal WOB YFV:CBSW, nt Ext:no sig edema Neuro: awake, alert Dialysis access: LUE AVF +b/t  Imaging: DG Chest Port 1 View  Result Date: 07/28/2021 CLINICAL DATA:  Increased shortness of breath and hypoxia. EXAM: PORTABLE CHEST 1 VIEW COMPARISON:  Portable chest 03/25/2021 FINDINGS: There are numerous overlying monitor wires. Moderate to severe cardiomegaly again noted with again seen central vascular distension and flow cephalization and mild interstitial edema in the bases. There are bilateral minimal pleural effusions which were noted previously as well as the interstitial edema. Today there is also increased patchy airspace disease in the right lower lung field concerning for pneumonia versus asymmetric edema. The  remaining lungs are clear of focal opacities. There is a stable mediastinal configuration. Mild thoracic dextroscoliosis. IMPRESSION: 1. Cardiomegaly with perihilar vascular prominence and mild interstitial edema. This could be from CHF or fluid overload and is similar to the last study as well as minimal pleural effusions. 2. Increased opacity in the right lower lung field concerning for superimposed pneumonia and / or asymmetric alveolar edema. Electronically Signed   By: Telford Nab M.D.   On: 07/28/2021 21:34    Labs: BMET Recent Labs  Lab 07/28/21 2121 07/29/21 0444  NA 138 137  K 3.7 3.7  CL 96* 99  CO2 30 27  GLUCOSE 111* 113*  BUN 34* 39*  CREATININE 7.24* 7.46*  CALCIUM 9.6 9.1   CBC Recent Labs  Lab 07/28/21 2121 07/29/21 0444  WBC 3.9* 4.1  NEUTROABS 2.8 2.8  HGB 8.3* 7.5*  HCT 27.1* 24.1*  MCV 110.6* 111.1*  PLT 62* 56*    Medications:     amLODipine  10 mg Oral QHS   carvedilol  25 mg Oral BID WC   Chlorhexidine Gluconate Cloth  6 each Topical Q0600   hydrALAZINE  100 mg Oral Q8H   levETIRAcetam  1,000 mg Oral Daily   sevelamer carbonate  1,600 mg Oral TID WC   spironolactone  50 mg Oral Daily      Gean Quint, MD  [facility] 07/29/2021, 12:23 PM

## 2021-07-29 NOTE — Assessment & Plan Note (Addendum)
Noted. The patient is on Mircera as outpatient. Her most recent dose was 100 mcg on 07/20/2021. Platelets have been low. Would avoid anticoagulants. Should be followed as outpatient. May need referral to hematology if continues. This may be due to [place]. Should also follow up with neurology for possible change in treatment for seizure disorder.

## 2021-07-29 NOTE — H&P (Addendum)
History and Physical    Patient: Madeline Brown WUJ:[id number] DOB: 11/30/1975 DOA: 07/28/2021 DOS: the patient was seen and examined on 07/29/2021 PCP: Sonia Side., FNP  Patient coming from: Home  Chief Complaint: Shortness of breath, palpitations Chief Complaint  Patient presents with   Shortness of Breath    HPI: Madeline Brown is a 46 y.o. female with medical history significant of ESRD on HD Tues/Thurs/Sat, anemia of CKD, seizure, CVA,and hypertension who presents with concerns of increasing shortness of breath and chest palpitation.  Her symptoms started about 3 days ago when she was noticing increasing shortness of breath.  She has as needed O2 from a previous hospitalization and slept with oxygen for tonight with some improvement in symptoms.  However yesterday when she went to work she felt anxious and had heart palpitation.  Today she was taking a shower and had a worsening dyspnea with hot steam and decided to call EMS.  She was found to be hypoxic down to 85% on room air by EMS.  She was placed on 4 L via nasal cannula in the ED with mild hypertension with BP of 160/105.  Has pancytopenia with WBC of 3.9, hemoglobin of 8.3, platelet of 62.  Creatinine is stable at 7.24 around her baseline.  No other significant electrolyte abnormalities.  Troponin of 19. Chest x-ray shows cardiomegaly and a bedside ultrasound was done by ED physician and no significant pericardial effusion was seen.  Nephrology was consulted and will take for unscheduled dialysis session tomorrow.  We will attempt to adjust her dry weight given recent weight loss.  Review of Systems: As mentioned in the history of present illness. All other systems reviewed and are negative. Past Medical History:  Diagnosis Date   Anemia    Chronic kidney disease    Headache, unspecified 09/26/2017   History of blood transfusion 07/2020   Hypertension    Seizure ([place]) 02/2019   Shortness of breath 07/15/2018    01/16/21 not currently   Stroke [facility])    Mild was told after having a seizure   Past Surgical History:  Procedure Laterality Date   AV FISTULA PLACEMENT Left 06/24/2017   Procedure: CREATION OF LEFT ARM BRACHIALCEPHALIC  ARTERIOVENOUS (AV) FISTULA;  Surgeon: Rosetta Posner, MD;  Location: [place];  Service: Vascular;  Laterality: Left;   CESAREAN SECTION     x2   COMPLEX WOUND CLOSURE Left 07/30/2020   Procedure: COMPLEX WOUND CLOSURE;  Surgeon: Cherre Robins, MD;  Location: [place];  Service: Vascular;  Laterality: Left;   FISTULA SUPERFICIALIZATION Left 10/28/2017   Procedure: SUPERFICIALIZATION LEFT BRACHIOCEPHALIC ARTERIOVENOUS FISTULA;  Surgeon: Rosetta Posner, MD;  Location: [place];  Service: Vascular;  Laterality: Left;   INGUINAL HERNIA REPAIR Bilateral 02/01/2021   Procedure: LAPAROSCOPIC BILATERAL INGUINAL HERNIA REPAIR WITH MESH;  Surgeon: Kinsinger, Arta Bruce, MD;  Location: [facility];  Service: General;  Laterality: Bilateral;   INSERTION OF DIALYSIS CATHETER Right 06/24/2017   Procedure: INSERTION OF TUNNELED DIALYSIS CATHETER;  Surgeon: Rosetta Posner, MD;  Location: [place];  Service: Vascular;  Laterality: Right;   INSERTION OF DIALYSIS CATHETER Right 07/30/2020   Procedure: INSERTION OF DIALYSIS CATHETER;  Surgeon: Cherre Robins, MD;  Location: [facility];  Service: Vascular;  Laterality: Right;   LIGATION OF COMPETING BRANCHES OF ARTERIOVENOUS FISTULA  10/28/2017   Procedure: LIGATION OF COMPETING BRANCHES OF ARTERIOVENOUS FISTULA x3;  Surgeon: Rosetta Posner, MD;  Location: [place];  Service: Vascular;;  REVISON OF ARTERIOVENOUS FISTULA Left 07/30/2020   Procedure: LEFT UPPER EXTREMITY ARTERIOVENOUS FISTULA REVISON;  Surgeon: Cherre Robins, MD;  Location: [place];  Service: Vascular;  Laterality: Left;  PERIPHERAL NERVE BLOCK   Social History:  reports that she has been smoking cigarettes. She has a 3.75 pack-year smoking history. She has never used smokeless tobacco. She reports that she does  not currently use alcohol. She reports current drug use. Drug: Marijuana.  Allergies  Allergen Reactions   Visine [Tetrahydrozoline Hcl] Swelling    Eyes Swelling    Family History  Problem Relation Age of Onset   Diabetes Father    Hypertension Father    Cancer Father    Heart disease Father    Aneurysm Mother    Seizures Mother     Prior to Admission medications   Medication Sig Start Date End Date Taking? Authorizing Provider  acetaminophen (TYLENOL) 325 MG tablet Take 650 mg by mouth every 6 (six) hours as needed for mild pain or moderate pain (for dialysis treatment).   Yes [provider]  amLODipine (NORVASC) 10 MG tablet Take 1 tablet (10 mg total) by mouth at bedtime. 12/09/19 01/29/22 Yes Aslam, Loralyn Freshwater, MD  carvedilol (COREG) 25 MG tablet Take 1 tablet (25 mg total) by mouth 2 (two) times daily with a meal. Patient taking differently: Take 25 mg by mouth 2 (two) times daily with a meal. Patient states they take on Tuesdays, Thursdays, and Saturdays at 1100 and 2000 Take on Sundays, Mondays, Wednesdays, and Fridays at 0630 and 1800 12/09/19 01/29/22 Yes Aslam, Sadia, MD  hydrALAZINE (APRESOLINE) 100 MG tablet Take 1 tablet (100 mg total) by mouth 3 (three) times daily. 12/09/19 01/30/23 Yes Aslam, Loralyn Freshwater, MD  levETIRAcetam (KEPPRA) 1000 MG tablet Take 1 tablet (1,000 mg total) by mouth 2 (two) times daily. Patient taking differently: Take 1,000 mg by mouth in the morning. 12/07/20  Yes Penumalli, Earlean Polka, MD  sevelamer carbonate (RENVELA) 800 MG tablet Take 2 tablets (1,600 mg total) by mouth 3 (three) times daily with meals. 02/21/19  Yes Madalyn Rob, MD  spironolactone (ALDACTONE) 50 MG tablet Take 1 tablet (50 mg total) by mouth daily. 12/09/19  Yes Aslam, Loralyn Freshwater, MD  Cinacalcet HCl (SENSIPAR PO) Take 1 capsule by mouth See admin instructions. Patient unclear of dose, patient only receives at the end of dialysis. Patient not taking: Reported on 07/28/2021 12/13/20 12/12/21   [provider]    Physical Exam: Vitals:   07/28/21 2130 07/28/21 2200 07/28/21 2215 07/28/21 2300  BP: (!) 149/100 (!) 152/100 (!) 162/105 (!) 161/100  Pulse: 98 93 89 95  Resp: (!) 23 (!) 26 (!) 35 20  Temp:      TempSrc:      SpO2: 96% 93% 98% 96%  Weight:      Height:       Constitutional: NAD, calm, comfortable, middle-age female appearing younger than her stated age lying approximately 45 degree incline in bed  eyes: lids and conjunctivae normal ENMT: Mucous membranes are moist.  Neck: normal, supple Respiratory: Diminished bibasilar lung sounds, no wheezing, no crackles. Normal respiratory effort. No accessory muscle use.  Cardiovascular: Regular rate and rhythm, no murmurs / rubs / gallops. No extremity edema.  Left upper extremity AV fistula Abdomen: no tenderness, Bowel sounds positive.  Musculoskeletal: no clubbing / cyanosis. No joint deformity upper and lower extremities. Good ROM, no contractures. Normal muscle tone.  Skin: no rashes, lesions, ulcers. No induration Neurologic: CN 2-12  grossly intact.  Strength 5/5 in all 4.  Psychiatric: Normal judgment and insight. Alert and oriented x 3. Normal mood.  Data Reviewed:  EKG on my review with sinus tachycardia, borderline right axis deviation, prolonged QRS.  Constitutional: NAD, calm, comfortable  Assessment and Plan:  Acute hypoxic respiratory failure secondary to fluid overload ESRD HD Tues/Thurs/Sat -No missed dialysis this week.   -Nephrology has been consulted will take for unscheduled hemodialysis tomorrow with adjustment to her dry weight since she has had some weight loss.  Pancytopenia Patient with chronic anemia like bleed due to CKD.  She also has progressive worsening thrombocytopenia in the past year.  Platelet of 62 on admit.  No signs of bleeding. -Check CBC with peripheral smear  Hypertension -Continue amlodipine, Coreg, hydralazine, spironolactone  Seizure Continue Keppra       Advance Care Planning:   Code Status: Prior Full  Consults: Nephrology  Family Communication: No family at bedside  Severity of Illness: The appropriate patient status for this patient is INPATIENT. Inpatient status is judged to be reasonable and necessary in order to provide the required intensity of service to ensure the patient's safety. The patient's presenting symptoms, physical exam findings, and initial radiographic and laboratory data in the context of their chronic comorbidities is felt to place them at high risk for further clinical deterioration. Furthermore, it is not anticipated that the patient will be medically stable for discharge from the hospital within 2 midnights of admission.   * I certify that at the point of admission it is my clinical judgment that the patient will require inpatient hospital care spanning beyond 2 midnights from the point of admission due to high intensity of service, high risk for further deterioration and high frequency of surveillance required.*  Author: Orene Desanctis, DO 07/29/2021 12:07 AM  For on call review [email or web address].

## 2021-07-29 NOTE — Assessment & Plan Note (Signed)
Blood pressures improved after dialysis. Dr. Candiss Norse has updated the patient's outpatient dialysis orders.  [facility] HD orders:[facility] 3h 3min 350/1.5x, 2k, 2cal, edw 54.5kg Hep none (needs updating). Mircera 136mcg q2weeks (last dose 2/4)  She has been cleared for discharge to home.

## 2021-07-29 NOTE — Assessment & Plan Note (Signed)
Pt has been evaluated by nephrology. She has undergone an extra dialysis today. Dr. Candiss Norse has updated the patient's outpatient dialysis orders: OP HD orders:[facility] 3h 62min 350/1.5x, 2k, 2cal, edw 54.5kg Hep none (needs updating). Mircera 156mcg q2weeks (last dose 2/4)  She has been cleared for discharge to home.

## 2021-07-29 NOTE — Progress Notes (Signed)
°   07/29/21 1603  TOC ED Mini Assessment  TOC Time spent with patient (minutes): 15  PING Used in TOC Assessment No  Admission or Readmission Diverted No  What brought you to the Emergency Department?  shortness of breath  Barriers to Discharge ED No Barriers  Means of departure Car   RNCM obtained permission to sign Condition Code 44.

## 2021-07-29 NOTE — Care Management Obs Status (Signed)
[facility] NOTIFICATION   Patient Details  Name: Madeline Brown MRN: [id number] Date of Birth: 1975-11-30   Medicare Observation Status Notification Given:  Yes    Fuller Mandril, RN 07/29/2021, 3:58 PM

## 2021-07-29 NOTE — Discharge Summary (Signed)
Physician Discharge Summary   Patient: Madeline Brown MRN: [id number] DOB: 15-Aug-1975  Admit date:     07/28/2021  Discharge date: 07/29/21  Discharge Physician: Karie Kirks   PCP: Sonia Side., FNP   Recommendations at discharge:    Discharge to home Resume [facility] dialysis schedule. Follow up with PCP in 7-10 days.  Discharge Diagnoses: Principal Problem:   Acute respiratory failure with hypoxia (HCC) Active Problems:   Hypertension   ESRD (end stage renal disease) (HCC)   Seizure (HCC)   Anemia in chronic kidney disease   Fluid overload, unspecified   Pancytopenia (HCC)   Volume overload  Resolved Problems:   * No resolved hospital problems. *   Hospital Course: Madeline Brown is a 46 y.o. female with medical history significant of ESRD on HD Tues/Thurs/Sat, anemia of CKD, seizure, CVA,and hypertension who presents with concerns of increasing shortness of breath and chest palpitation.   Her symptoms started about 3 days ago when she was noticing increasing shortness of breath.  She has as needed O2 from a previous hospitalization and slept with oxygen for tonight with some improvement in symptoms.  However yesterday when she went to work she felt anxious and had heart palpitation.  Today she was taking a shower and had a worsening dyspnea with hot steam and decided to call EMS.   She was found to be hypoxic down to 85% on room air by EMS.  She was placed on 4 L via nasal cannula in the ED with mild hypertension with BP of 160/105.  Has pancytopenia with WBC of 3.9, hemoglobin of 8.3, platelet of 62.  Creatinine is stable at 7.24 around her baseline.  No other significant electrolyte abnormalities.  Troponin of 19. Chest x-ray shows cardiomegaly and a bedside ultrasound was done by ED physician and no significant pericardial effusion was seen.   Nephrology was consulted. The patient underwent dialysis today in fair condition. She feels much better. Dr. Candiss Norse has revised her  OP HD orders: [facility]; 3h 73min    350/1.5x, 2k, 2cal, edw 54.5kg   Hep none  (needs updating). Mircera 177mcg q2weeks (last dose 2/4).  Assessment and Plan: Assessment and Plan: Acute on chronic respiratory failure with hypoxia ([place])- (present on admission) Due to pulmonary edema related to volume overload. The patient has undergone extra HD today. She is feeling much better. She is saturating 97% on 3L O2. She has home O2 at home from a previous admission. She is cleared for discharge to home be nephrology.  Volume overload- (present on admission) Pt has been evaluated by nephrology. She has undergone an extra dialysis today. Dr. Candiss Norse has updated the patient's outpatient dialysis orders: OP HD orders: [facility]  3h 3min    350/1.5x, 2k, 2cal, edw 54.5kg   Hep none  (needs updating). Mircera 178mcg q2weeks (last dose 2/4)  She has been cleared for discharge to home.  Pancytopenia ([place])- (present on admission) Noted. The patient is on Mircera as outpatient. Her most recent dose was 100 mcg on 07/20/2021. Platelets have been low. Would avoid anticoagulants. Should be followed as outpatient. May need referral to hematology if continues. This may be due to [place]. Should also follow up with neurology for possible change in treatment for seizure disorder.  Anemia in chronic kidney disease- (present on admission) Her hemoglobin is usually in the 8's. Today is is down to 7.5. She takes Mircera as outpatient. Her last dose was 180mcg on 2/4.   Seizure ([place])  No evidence of seizure activity. The patient has some pancytopenia. She should follow up with her neurologist as outpatient as Keppra may be playing a role here.  ESRD (end stage renal disease) ([place])- (present on admission) Pt has been evaluated by nephrology. She has undergone an extra dialysis today. Dr. Candiss Norse has updated the patient's outpatient dialysis orders: OP HD orders: [facility]  3h 40min    350/1.5x, 2k, 2cal, edw 54.5kg   Hep none   (needs updating). Mircera 190mcg q2weeks (last dose 2/4)  She has been cleared for discharge to home.  Hypertension- (present on admission) Blood pressures improved after dialysis. Dr. Candiss Norse has updated the patient's outpatient dialysis orders.  [facility] HD orders: [facility]  3h 3min    350/1.5x, 2k, 2cal, edw 54.5kg   Hep none  (needs updating). Mircera 124mcg q2weeks (last dose 2/4)  She has been cleared for discharge to home.   I have seen and examined this patient myself. I have spent 34 minutes in her evaluation and care.  Consultants: Nephrology Procedures performed: HD  Disposition: Home Diet recommendation:  Discharge Diet Orders (From admission, onward)     Start     Ordered   07/29/21 0000  Diet - low sodium heart healthy        07/29/21 1505           Cardiac diet  DISCHARGE MEDICATION: Allergies as of 07/29/2021       Reactions   Visine [tetrahydrozoline Hcl] Swelling   Eyes Swelling        Medication List     STOP taking these medications    SENSIPAR PO       TAKE these medications    acetaminophen 325 MG tablet Commonly known as: TYLENOL Take 650 mg by mouth every 6 (six) hours as needed for mild pain or moderate pain (for dialysis treatment).   amLODipine 10 MG tablet Commonly known as: NORVASC Take 1 tablet (10 mg total) by mouth at bedtime.   carvedilol 25 MG tablet Commonly known as: COREG Take 1 tablet (25 mg total) by mouth 2 (two) times daily with a meal. What changed: additional instructions   hydrALAZINE 100 MG tablet Commonly known as: APRESOLINE Take 1 tablet (100 mg total) by mouth 3 (three) times daily.   levETIRAcetam 1000 MG tablet Commonly known as: KEPPRA Take 1 tablet (1,000 mg total) by mouth 2 (two) times daily. What changed: when to take this   sevelamer carbonate 800 MG tablet Commonly known as: RENVELA Take 2 tablets (1,600 mg total) by mouth 3 (three) times daily with meals.   spironolactone 50 MG  tablet Commonly known as: ALDACTONE Take 1 tablet (50 mg total) by mouth daily.         Discharge Exam: Filed Weights   07/28/21 2052 07/29/21 1230  Weight: 54.5 kg 53.3 kg   Vitals:   07/29/21 1445 07/29/21 1515  BP: 126/81 (!) 138/94  Pulse: 87 90  Resp: 18 (!) 21  Temp:    SpO2: 97% 97%   Exam:  Constitutional:  The patient is awake, alert, and oriented x 3. No acute distress. Respiratory:  No increased work of breathing. No wheezes, rales, or rhonchi No tactile fremitus Cardiovascular:  Regular rate and rhythm No murmurs, ectopy, or gallups. No lateral PMI. No thrills. Abdomen:  Abdomen is soft, non-tender, non-distended No hernias, masses, or organomegaly Normoactive bowel sounds.  Musculoskeletal:  No cyanosis, clubbing, or edema Skin:  No rashes, lesions, ulcers palpation  of skin: no induration or nodules Neurologic:  CN 2-12 intact Sensation all 4 extremities intact Psychiatric:  Mental status Mood, affect appropriate Orientation to person, place, time  judgment and insight appear intact   Condition at discharge: fair  The results of significant diagnostics from this hospitalization (including imaging, microbiology, ancillary and laboratory) are listed below for reference.   Imaging Studies: DG Chest Port 1 View  Result Date: 07/28/2021 CLINICAL DATA:  Increased shortness of breath and hypoxia. EXAM: PORTABLE CHEST 1 VIEW COMPARISON:  Portable chest 03/25/2021 FINDINGS: There are numerous overlying monitor wires. Moderate to severe cardiomegaly again noted with again seen central vascular distension and flow cephalization and mild interstitial edema in the bases. There are bilateral minimal pleural effusions which were noted previously as well as the interstitial edema. Today there is also increased patchy airspace disease in the right lower lung field concerning for pneumonia versus asymmetric edema. The remaining lungs are clear of focal  opacities. There is a stable mediastinal configuration. Mild thoracic dextroscoliosis. IMPRESSION: 1. Cardiomegaly with perihilar vascular prominence and mild interstitial edema. This could be from CHF or fluid overload and is similar to the last study as well as minimal pleural effusions. 2. Increased opacity in the right lower lung field concerning for superimposed pneumonia and / or asymmetric alveolar edema. Electronically Signed   By: Telford Nab M.D.   On: 07/28/2021 21:34    Microbiology: Results for orders placed or performed during the hospital encounter of 07/28/21  Resp Panel by RT-PCR (Flu A&B, Covid) Nasopharyngeal Swab     Status: None   Collection Time: 07/28/21  9:08 PM   Specimen: Nasopharyngeal Swab; Nasopharyngeal(NP) swabs in vial transport medium  Result Value Ref Range Status   SARS Coronavirus 2 by RT PCR NEGATIVE NEGATIVE Final    Comment: (NOTE) SARS-CoV-2 target nucleic acids are NOT DETECTED.  The SARS-CoV-2 RNA is generally detectable in upper respiratory specimens during the acute phase of infection. The lowest concentration of SARS-CoV-2 viral copies this assay can detect is 138 copies/mL. A negative result does not preclude SARS-Cov-2 infection and should not be used as the sole basis for treatment or other patient management decisions. A negative result may occur with  improper specimen collection/handling, submission of specimen other than nasopharyngeal swab, presence of viral mutation(s) within the areas targeted by this assay, and inadequate number of viral copies(<138 copies/mL). A negative result must be combined with clinical observations, patient history, and epidemiological information. The expected result is Negative.  Fact Sheet for Patients:  EntrepreneurPulse.com.au  Fact Sheet for Healthcare Providers:  IncredibleEmployment.be  This test is no t yet approved or cleared by the [place] FDA and  has  been authorized for detection and/or diagnosis of SARS-CoV-2 by FDA under an Emergency Use Authorization (EUA). This EUA will remain  in effect (meaning this test can be used) for the duration of the COVID-19 declaration under Section 564(b)(1) of the Act, 21 U.S.C.section 360bbb-3(b)(1), unless the authorization is terminated  or revoked sooner.       Influenza A by PCR NEGATIVE NEGATIVE Final   Influenza B by PCR NEGATIVE NEGATIVE Final    Comment: (NOTE) The Xpert Xpress SARS-CoV-2/FLU/RSV plus assay is intended as an aid in the diagnosis of influenza from Nasopharyngeal swab specimens and should not be used as a sole basis for treatment. Nasal washings and aspirates are unacceptable for Xpert Xpress SARS-CoV-2/FLU/RSV testing.  Fact Sheet for Patients: EntrepreneurPulse.com.au  Fact Sheet for Healthcare Providers: IncredibleEmployment.be  This test is not yet approved or cleared by the [place] and has been authorized for detection and/or diagnosis of SARS-CoV-2 by FDA under an Emergency Use Authorization (EUA). This EUA will remain in effect (meaning this test can be used) for the duration of the COVID-19 declaration under Section 564(b)(1) of the Act, 21 U.S.C. section 360bbb-3(b)(1), unless the authorization is terminated or revoked.  Performed at [facility], [place] [address], [place], [place] [address]     Labs: CBC: Recent Labs  Lab 07/28/21 2121 07/29/21 0444  WBC 3.9* 4.1  NEUTROABS 2.8 2.8  HGB 8.3* 7.5*  HCT 27.1* 24.1*  MCV 110.6* 111.1*  PLT 62* 56*   Basic Metabolic Panel: Recent Labs  Lab 07/28/21 2121 07/29/21 0444  NA 138 137  K 3.7 3.7  CL 96* 99  CO2 30 27  GLUCOSE 111* 113*  BUN 34* 39*  CREATININE 7.24* 7.46*  CALCIUM 9.6 9.1   Liver Function Tests: Recent Labs  Lab 07/28/21 2121  AST 15  ALT 11  ALKPHOS 39  BILITOT 1.8*  PROT 7.0  ALBUMIN 3.6   CBG: No results for  input(s): GLUCAP in the last 168 hours.  Discharge time spent: greater than 30 minutes.  Signed: Anas Reister, DO [facility] 07/29/2021 4:10 PM.

## 2021-07-29 NOTE — ED Notes (Signed)
Patient alerts and oriented x4 and ambulatory with steady gait. Patient has taken all her belongings with her and denies missing anything upon leaving the ED.

## 2021-07-29 NOTE — Assessment & Plan Note (Signed)
Pt has been evaluated by nephrology. She has undergone an extra dialysis today. Dr. Candiss Norse has updated the patient's outpatient dialysis orders: OP HD orders:[facility] 3h 60min 350/1.5x, 2k, 2cal, edw 54.5kg Hep none (needs updating). Mircera 154mcg q2weeks (last dose 2/4)  She has been cleared for discharge to home.

## 2021-07-29 NOTE — ED Notes (Signed)
Breakfast orders Placed °

## 2021-07-29 NOTE — Assessment & Plan Note (Signed)
Due to pulmonary edema related to volume overload. The patient has undergone extra HD today. She is feeling much better. She is saturating 97% on 3L O2. She has home O2 at home from a previous admission. She is cleared for discharge to home be nephrology.

## 2021-07-29 NOTE — ED Notes (Signed)
Informed Consent for HD signed and at bedside.

## 2021-07-29 NOTE — Assessment & Plan Note (Signed)
Her hemoglobin is usually in the 8's. Today is is down to 7.5. She takes Mircera as outpatient. Her last dose was 186mcg on 2/4.

## 2021-07-30 LAB — PATHOLOGIST SMEAR REVIEW

## 2021-07-30 LAB — HEPATITIS B SURFACE ANTIBODY, QUANTITATIVE: Hep B S AB Quant (Post): 208.6 m[IU]/mL (ref >9.9)

## 2021-08-09 NOTE — Progress Notes (Unsigned)
Cardiology Office Note   Date:  08/09/2021   ID:  Madeline Brown, DOB 06/27/1975, MRN [id number]  PCP:  Sonia Side., FNP  Cardiologist:   Jenkins Rouge, MD   No chief complaint on file.     History of Present Illness: Madeline Brown is a 46 y.o. female who presents for post hospital f/u pericardial effusion. Seen by me in August 2018 for elevated troponin in setting of CRF and anemia. Seen 07/12/18 by Dr Debara Pickett. Patient ESRD now on dialysis , Smoker , Low Platelets Has been on minoxidil for HTN and TTE with small to moderate pericardial effusion.   TTE 07/12/18 EF 60-65% severe LVH Estimated PA pressure 54 mmHg  Small to moderate circumferential effusion no tamponade  She had high oxygen requirements CTA negative for PE  To f/u Dr Nelda Marseille auto immune studies negative Needs sleep study  She continues to have issues with arranging transportation to dialysis  She sees [facility] for her access which seems very large in LUE  F/U TTE 12/08/19 showed resolution of effusion with normal EF and no significant valve dx  Hospitalized 12/07/19 with HTN emergency ICH and seizures BP meds adjusted and started on Keppra She was non compliant with her meds and presented with pulmonary edema   She is being followed for transplant at [facility] Had stress test in October 2021 normal Has 5 kids two olderst 27,29 can help and she has better support system  I am still concerned about her huge fistula She does not seem to get good f/u with [facility] I think her shunt is too big and will lead to high output CHF  Had laparoscopic bilateral inguinal hernia repair with mesh 02/01/21   Admitted 2/12-2/13/23  for dyspnea and palpitations She was hypoxic with PLT 62 Hb 8.3 CXR just CE POS echo no effusion Felt better with dialysis She has home oxygen Using Mircera ( long acting erythropoietin    Past Medical History:  Diagnosis Date   Anemia    Chronic kidney disease    Headache, unspecified 09/26/2017    History of blood transfusion 07/2020   Hypertension    Seizure ([place]) 02/2019   Shortness of breath 07/15/2018   01/16/21 not currently   Stroke [facility])    Mild was told after having a seizure    Past Surgical History:  Procedure Laterality Date   AV FISTULA PLACEMENT Left 06/24/2017   Procedure: CREATION OF LEFT ARM BRACHIALCEPHALIC  ARTERIOVENOUS (AV) FISTULA;  Surgeon: Rosetta Posner, MD;  Location: [place];  Service: Vascular;  Laterality: Left;   CESAREAN SECTION     x2   COMPLEX WOUND CLOSURE Left 07/30/2020   Procedure: COMPLEX WOUND CLOSURE;  Surgeon: Cherre Robins, MD;  Location: [place];  Service: Vascular;  Laterality: Left;   FISTULA SUPERFICIALIZATION Left 10/28/2017   Procedure: SUPERFICIALIZATION LEFT BRACHIOCEPHALIC ARTERIOVENOUS FISTULA;  Surgeon: Rosetta Posner, MD;  Location: [facility];  Service: Vascular;  Laterality: Left;   INGUINAL HERNIA REPAIR Bilateral 02/01/2021   Procedure: LAPAROSCOPIC BILATERAL INGUINAL HERNIA REPAIR WITH MESH;  Surgeon: Kinsinger, Arta Bruce, MD;  Location: [facility];  Service: General;  Laterality: Bilateral;   INSERTION OF DIALYSIS CATHETER Right 06/24/2017   Procedure: INSERTION OF TUNNELED DIALYSIS CATHETER;  Surgeon: Rosetta Posner, MD;  Location: [facility];  Service: Vascular;  Laterality: Right;   INSERTION OF DIALYSIS CATHETER Right 07/30/2020   Procedure: INSERTION OF DIALYSIS CATHETER;  Surgeon: Cherre Robins, MD;  Location[facility];  Service: Vascular;  Laterality: Right;   LIGATION OF COMPETING BRANCHES OF ARTERIOVENOUS FISTULA  10/28/2017   Procedure: LIGATION OF COMPETING BRANCHES OF ARTERIOVENOUS FISTULA x3;  Surgeon: Rosetta Posner, MD;  Location: [facility];  Service: Vascular;;   REVISON OF ARTERIOVENOUS FISTULA Left 07/30/2020   Procedure: LEFT UPPER EXTREMITY ARTERIOVENOUS FISTULA REVISON;  Surgeon: Cherre Robins, MD;  Location[facility];  Service: Vascular;  Laterality: Left;  PERIPHERAL NERVE BLOCK     Current Outpatient Medications  Medication  Sig Dispense Refill   acetaminophen (TYLENOL) 325 MG tablet Take 650 mg by mouth every 6 (six) hours as needed for mild pain or moderate pain (for dialysis treatment).     amLODipine (NORVASC) 10 MG tablet Take 1 tablet (10 mg total) by mouth at bedtime. 30 tablet 0   carvedilol (COREG) 25 MG tablet Take 1 tablet (25 mg total) by mouth 2 (two) times daily with a meal. (Patient taking differently: Take 25 mg by mouth 2 (two) times daily with a meal. Patient states they take on Tuesdays, Thursdays, and Saturdays at 1100 and 2000 Take on Sundays, Mondays, Wednesdays, and Fridays at 0630 and 1800) 60 tablet 0   hydrALAZINE (APRESOLINE) 100 MG tablet Take 1 tablet (100 mg total) by mouth 3 (three) times daily. 90 tablet 0   levETIRAcetam (KEPPRA) 1000 MG tablet Take 1 tablet (1,000 mg total) by mouth 2 (two) times daily. (Patient taking differently: Take 1,000 mg by mouth in the morning.) 60 tablet 1   sevelamer carbonate (RENVELA) 800 MG tablet Take 2 tablets (1,600 mg total) by mouth 3 (three) times daily with meals. 30 tablet 0   spironolactone (ALDACTONE) 50 MG tablet Take 1 tablet (50 mg total) by mouth daily. 30 tablet 0   No current facility-administered medications for this visit.    Allergies:   Visine [tetrahydrozoline hcl]    Social History:  The patient  reports that she has been smoking cigarettes. She has a 3.75 pack-year smoking history. She has never used smokeless tobacco. She reports that she does not currently use alcohol. She reports current drug use. Drug: Marijuana.   Family History:  The patient's family history includes Aneurysm in her mother; Cancer in her father; Diabetes in her father; Heart disease in her father; Hypertension in her father; Seizures in her mother.    ROS:  Please see the history of present illness.   Otherwise, review of systems are positive for none.   All other systems are reviewed and negative.    PHYSICAL EXAM: VS:  LMP 01/06/2018  , BMI There is  no height or weight on file to calculate BMI. Affect appropriate Chronically ill black female  HEENT: normal Neck supple with no adenopathy JVP normal no bruits no thyromegaly Lungs clear with no wheezing and good diaphragmatic motion Heart:  S1/S2 continuous shunt murmur, no rub, gallop or click PMI normal Abdomen: benighn, BS positve, no tenderness, no AAA no bruit.  No HSM or HJR Distal pulses intact with no bruits No edema Neuro non-focal Skin warm and dry No muscular weakness LUE fistula is gigantic especially for her size loud thrills    EKG:  07/12/18 SR LAE LVH low voltage    Recent Labs: 03/25/2021: B Natriuretic Peptide 658.6 07/28/2021: ALT 11 07/29/2021: BUN 13; Creatinine, Ser 3.74; Hemoglobin 7.5; Platelets 56; Potassium 3.4; Sodium 139    Lipid Panel    Component Value Date/Time   CHOL 172 03/23/2019 0839  TRIG 55 03/23/2019 0839   HDL 93 03/23/2019 0839   CHOLHDL 1.8 03/23/2019 0839   CHOLHDL 2.7 02/21/2019 0425   VLDL 10 02/21/2019 0425   LDLCALC 68 03/23/2019 0839      Wt Readings from Last 3 Encounters:  07/29/21 117 lb 8.1 oz (53.3 kg)  03/26/21 126 lb 8.7 oz (57.4 kg)  02/01/21 123 lb 14.4 oz (56.2 kg)      Other studies Reviewed: Additional studies/ records that were reviewed today include: Notes from hospitalization January 2020 [facility] consult Note labs CTA, CXR and TTE .    ASSESSMENT AND PLAN:  1.  Pericardial effusion: likely related to uremia. Resolved on f/u echo 12/08/19  2.  HTN:  Well controlled.  Continue current medications and  low sodium [place] type diet.   3  CRF:  Continue dialysis has had issues with fistula Seems too large to me with large shunt Causing continuous murmur in heart f/ [facility] is not ideal Will refer her to our [facility] surgeons for another opinion but I fear that this fistula contributes and will cause High output CHF  4. Pulmonary HTN:  F/u pulmonary needs sleep study should be improved with lower volume   5. ICH: previous stable on Keppra for seizure prophylaxis    Current medicines are reviewed at length with the patient today.  The patient does not have concerns regarding medicines.  The following changes have been made:  no change  Labs/ tests ordered today include: None   No orders of the defined types were placed in this encounter.    Disposition:   FU with cardiology year Refer to [facility]     Signed, Jenkins Rouge, MD  08/09/2021 5:24 PM    [facility] [place], [place], [place]  [address] Phone: [phone]; Fax: [phone]

## 2021-08-13 ENCOUNTER — Ambulatory Visit: Payer: Medicare HMO | Admitting: Cardiovascular Disease

## 2021-08-17 ENCOUNTER — Other Ambulatory Visit: Payer: Self-pay

## 2021-08-17 ENCOUNTER — Inpatient Hospital Stay (HOSPITAL_COMMUNITY)
Admission: EM | Admit: 2021-08-17 | Discharge: 2021-08-20 | DRG: 291 | Disposition: A | Discharge status: Home / Self Care | Payer: Medicare HMO | Attending: Internal Medicine | Admitting: Internal Medicine

## 2021-08-17 ENCOUNTER — Encounter (HOSPITAL_COMMUNITY): Payer: Self-pay

## 2021-08-17 DIAGNOSIS — N2581 Secondary hyperparathyroidism of renal origin: Secondary | ICD-10-CM | POA: Diagnosis present

## 2021-08-17 DIAGNOSIS — J9621 Acute and chronic respiratory failure with hypoxia: Secondary | ICD-10-CM | POA: Diagnosis present

## 2021-08-17 DIAGNOSIS — Z79899 Other long term (current) drug therapy: Secondary | ICD-10-CM

## 2021-08-17 DIAGNOSIS — N186 End stage renal disease: Secondary | ICD-10-CM | POA: Diagnosis present

## 2021-08-17 DIAGNOSIS — Z833 Family history of diabetes mellitus: Secondary | ICD-10-CM

## 2021-08-17 DIAGNOSIS — J810 Acute pulmonary edema: Secondary | ICD-10-CM | POA: Diagnosis present

## 2021-08-17 DIAGNOSIS — F17210 Nicotine dependence, cigarettes, uncomplicated: Secondary | ICD-10-CM | POA: Diagnosis present

## 2021-08-17 DIAGNOSIS — J9600 Acute respiratory failure, unspecified whether with hypoxia or hypercapnia: Secondary | ICD-10-CM | POA: Diagnosis not present

## 2021-08-17 DIAGNOSIS — Z992 Dependence on renal dialysis: Secondary | ICD-10-CM | POA: Diagnosis present

## 2021-08-17 DIAGNOSIS — Z20822 Contact with and (suspected) exposure to covid-19: Secondary | ICD-10-CM | POA: Diagnosis present

## 2021-08-17 DIAGNOSIS — E8770 Fluid overload, unspecified: Secondary | ICD-10-CM | POA: Diagnosis present

## 2021-08-17 DIAGNOSIS — Z809 Family history of malignant neoplasm, unspecified: Secondary | ICD-10-CM

## 2021-08-17 DIAGNOSIS — G40909 Epilepsy, unspecified, not intractable, without status epilepticus: Secondary | ICD-10-CM

## 2021-08-17 DIAGNOSIS — I5033 Acute on chronic diastolic (congestive) heart failure: Secondary | ICD-10-CM

## 2021-08-17 DIAGNOSIS — E876 Hypokalemia: Secondary | ICD-10-CM

## 2021-08-17 DIAGNOSIS — I132 Hypertensive heart and chronic kidney disease with heart failure and with stage 5 chronic kidney disease, or end stage renal disease: Principal | ICD-10-CM | POA: Diagnosis present

## 2021-08-17 DIAGNOSIS — D696 Thrombocytopenia, unspecified: Secondary | ICD-10-CM | POA: Diagnosis present

## 2021-08-17 DIAGNOSIS — Z8249 Family history of ischemic heart disease and other diseases of the circulatory system: Secondary | ICD-10-CM

## 2021-08-17 DIAGNOSIS — Z91119 Patient's noncompliance with dietary regimen due to unspecified reason: Secondary | ICD-10-CM

## 2021-08-17 DIAGNOSIS — Z8673 Personal history of transient ischemic attack (TIA), and cerebral infarction without residual deficits: Secondary | ICD-10-CM

## 2021-08-17 DIAGNOSIS — D631 Anemia in chronic kidney disease: Secondary | ICD-10-CM | POA: Diagnosis present

## 2021-08-17 DIAGNOSIS — J9601 Acute respiratory failure with hypoxia: Secondary | ICD-10-CM | POA: Diagnosis present

## 2021-08-17 DIAGNOSIS — I10 Essential (primary) hypertension: Secondary | ICD-10-CM | POA: Diagnosis present

## 2021-08-17 DIAGNOSIS — Z888 Allergy status to other drugs, medicaments and biological substances status: Secondary | ICD-10-CM

## 2021-08-17 NOTE — ED Provider Notes (Signed)
?[place] DEPT ?[facility] Emergency Department ?Provider Note ?MRN:  [id number]  ?Arrival date & time: 08/18/21    ? ?Chief Complaint   ?Shortness of Breath ?  ?History of Present Illness   ?Madeline Brown is a 46 y.o. year-old female presents to the ED with chief complaint of SOB.  She reports that she had worsening shortness of breath tonight.  Has history of ESRD on HD Tuesday/Thursday/Saturday.  She states she was dialyzed this morning and felt well all day long until this evening when she became significantly short of breath.  She called for EMS and was noted to be 75% on room air.  She reports associated productive cough and left sided upper back pain.  She denies fevers or chills.  Denies any other associated symptoms. ? ?History provided by patient. ? ? ?Review of Systems  ?Pertinent review of systems noted in HPI.  ? ? ?Physical Exam  ? ?Vitals:  ? 08/18/21 0130 08/18/21 0200  ?BP: 106/72 119/84  ?Pulse: 89 88  ?Resp: 15 (!) 21  ?Temp:    ?SpO2: 97% 95%  ?  ?CONSTITUTIONAL:  well-appearing, NAD, on 6L [place] ?NEURO:  Alert and oriented x 3, CN 3-12 grossly intact ?EYES:  eyes equal and reactive ?ENT/NECK:  Supple, no stridor  ?CARDIO:  mildly tachycardic, regular rhythm, appears well-perfused  ?PULM:  Mildly increased WOB, able to speak in complete sentences, CTAB ?GI/GU:  non-distended,  ?MSK/SPINE:  No gross deformities, no edema, moves all extremities  ?SKIN:  no rash, atraumatic ? ? ?*Additional and/or pertinent findings included in MDM below ? ?Diagnostic and Interventional Summary  ? ? EKG Interpretation ? ?Date/Time:  Saturday August 17 2021 23:40:50 EST ?Ventricular Rate:  103 ?PR Interval:  154 ?QRS Duration: 90 ?QT Interval:  337 ?QTC Calculation: 442 ?R Axis:   59 ?Text Interpretation: Sinus tachycardia Biatrial enlargement Borderline T abnormalities, diffuse leads Confirmed by Ripley Fraise [id number]) on 08/17/2021 11:42:48 PM ?  ? ?  ? ?Labs Reviewed  ?CBC WITH DIFFERENTIAL/PLATELET -  Abnormal; Notable for the following components:  ?    Result Value  ? RBC 2.82 (*)   ? Hemoglobin 10.1 (*)   ? HCT 31.4 (*)   ? MCV 111.3 (*)   ? MCH 35.8 (*)   ? RDW 16.2 (*)   ? Platelets 74 (*)   ? Lymphs Abs 0.6 (*)   ? All other components within normal limits  ?BASIC METABOLIC PANEL - Abnormal; Notable for the following components:  ? Potassium 3.0 (*)   ? Chloride 91 (*)   ? CO2 34 (*)   ? Glucose, Bld 192 (*)   ? BUN 26 (*)   ? Creatinine, Ser 4.98 (*)   ? GFR, Estimated 10 (*)   ? All other components within normal limits  ?TROPONIN I (HIGH SENSITIVITY) - Abnormal; Notable for the following components:  ? Troponin I (High Sensitivity) 25 (*)   ? All other components within normal limits  ?RESP PANEL BY RT-PCR (FLU A&B, COVID) [id number]  ?BRAIN NATRIURETIC PEPTIDE  ?TROPONIN I (HIGH SENSITIVITY)  ?  ?DG Chest Port 1 View  ?Final Result  ?  ?  ?Medications - No data to display  ? ?Procedures  /  Critical Care ?Procedures ? ?ED Course and Medical Decision Making  ?I have reviewed the triage vital signs, the nursing notes, and pertinent available records from the EMR. ? ?Complexity of Problems Addressed: ?High Complexity: Acute illness/injury posing a threat to life or bodily  function, requiring emergent diagnostic workup, evaluation, and treatment as below. ?Comorbidities affecting this illness/injury include: ?ESRD, HTN ?Social Determinants Affecting Care: ? ? ? ?ED Course: ?After considering the following differential, fluid overload, pneumonia, COVID, flu, ACS, I ordered labs and imaging. ?I personally interpreted the labs which are notable for potassium of 3.0 ?I visualized the chest x-ray x-ray which is notable for mild edema and agree with the radiologist interpretation. ?EKG shows no acute ischemic changes ?I observed the patient while on the cardiac monitor and noted no significant dysrhythmias.. ? ?Clinical Course as of 08/18/21 0218  ?Sat Aug 17, 2021  ?2359 O2 sat 79% on arrival, currently on 6L nasal  canula and holding at greater than 95%.  Will wean as able.  Labs and imaging ordered. [RB]  ?  ?Clinical Course User Index ?[RB] Montine Circle, PA-C  ? ? ?Consultants: ?I discussed the case with Hospitalist, Dr. Sidney Ace, who is appreciated for admitting. ? ?Treatment and Plan: ?Admit for acute respiratory failure with hypoxemia ? ?Patient's exam and diagnostic results are concerning for respiratory failure.  Feel that patient will need admission to the hospital for further treatment and evaluation. ? ?Patient seen by and discussed with attending physician, Dr. Christy Gentles, who agrees with plan for admission. ? ?Final Clinical Impressions(s) / ED Diagnoses  ? ?  ICD-10-CM   ?1. Acute respiratory failure with hypoxemia (HCC)  J96.01   ?  ?  ?ED Discharge Orders   ? ? None  ? ?  ?  ? ? ?Discharge Instructions Discussed with and Provided to Patient:  ? ?Discharge Instructions   ?None ?  ? ?  ?Montine Circle, PA-C ?08/18/21 4270 ? ?  ?Ripley Fraise, MD ?08/18/21 [phone] ? ?

## 2021-08-17 NOTE — ED Triage Notes (Signed)
Arrived [facility] from home with c/o worsening shob beginning last night. Placed 2L [place] of home oxygen. Nonproductive cough x 1 week. Found at 75% ra. Placed on 15L NRB with improvement to 96%. Completed dialysis today (T, TH, S). 79% room air saturation upon arrival.  ?

## 2021-08-18 ENCOUNTER — Emergency Department (HOSPITAL_COMMUNITY): Payer: Medicare HMO

## 2021-08-18 DIAGNOSIS — Z8249 Family history of ischemic heart disease and other diseases of the circulatory system: Secondary | ICD-10-CM | POA: Diagnosis not present

## 2021-08-18 DIAGNOSIS — D696 Thrombocytopenia, unspecified: Secondary | ICD-10-CM | POA: Diagnosis present

## 2021-08-18 DIAGNOSIS — E876 Hypokalemia: Secondary | ICD-10-CM

## 2021-08-18 DIAGNOSIS — Z833 Family history of diabetes mellitus: Secondary | ICD-10-CM | POA: Diagnosis not present

## 2021-08-18 DIAGNOSIS — Z79899 Other long term (current) drug therapy: Secondary | ICD-10-CM | POA: Diagnosis not present

## 2021-08-18 DIAGNOSIS — E8770 Fluid overload, unspecified: Secondary | ICD-10-CM

## 2021-08-18 DIAGNOSIS — I132 Hypertensive heart and chronic kidney disease with heart failure and with stage 5 chronic kidney disease, or end stage renal disease: Secondary | ICD-10-CM | POA: Diagnosis present

## 2021-08-18 DIAGNOSIS — J9621 Acute and chronic respiratory failure with hypoxia: Secondary | ICD-10-CM

## 2021-08-18 DIAGNOSIS — Z888 Allergy status to other drugs, medicaments and biological substances status: Secondary | ICD-10-CM | POA: Diagnosis not present

## 2021-08-18 DIAGNOSIS — I5033 Acute on chronic diastolic (congestive) heart failure: Secondary | ICD-10-CM | POA: Diagnosis present

## 2021-08-18 DIAGNOSIS — I10 Essential (primary) hypertension: Secondary | ICD-10-CM

## 2021-08-18 DIAGNOSIS — J9601 Acute respiratory failure with hypoxia: Secondary | ICD-10-CM | POA: Diagnosis present

## 2021-08-18 DIAGNOSIS — Z91119 Patient's noncompliance with dietary regimen due to unspecified reason: Secondary | ICD-10-CM | POA: Diagnosis not present

## 2021-08-18 DIAGNOSIS — Z8673 Personal history of transient ischemic attack (TIA), and cerebral infarction without residual deficits: Secondary | ICD-10-CM | POA: Diagnosis not present

## 2021-08-18 DIAGNOSIS — N2581 Secondary hyperparathyroidism of renal origin: Secondary | ICD-10-CM | POA: Diagnosis present

## 2021-08-18 DIAGNOSIS — N186 End stage renal disease: Secondary | ICD-10-CM

## 2021-08-18 DIAGNOSIS — J9600 Acute respiratory failure, unspecified whether with hypoxia or hypercapnia: Secondary | ICD-10-CM | POA: Diagnosis present

## 2021-08-18 DIAGNOSIS — Z809 Family history of malignant neoplasm, unspecified: Secondary | ICD-10-CM | POA: Diagnosis not present

## 2021-08-18 DIAGNOSIS — F17210 Nicotine dependence, cigarettes, uncomplicated: Secondary | ICD-10-CM | POA: Diagnosis present

## 2021-08-18 DIAGNOSIS — G40909 Epilepsy, unspecified, not intractable, without status epilepticus: Secondary | ICD-10-CM | POA: Diagnosis present

## 2021-08-18 DIAGNOSIS — Z20822 Contact with and (suspected) exposure to covid-19: Secondary | ICD-10-CM | POA: Diagnosis present

## 2021-08-18 DIAGNOSIS — D631 Anemia in chronic kidney disease: Secondary | ICD-10-CM | POA: Diagnosis present

## 2021-08-18 DIAGNOSIS — Z992 Dependence on renal dialysis: Secondary | ICD-10-CM | POA: Diagnosis not present

## 2021-08-18 LAB — CBC
HCT: 27.8 % — ABNORMAL LOW (ref 36.0–46.0)
Hemoglobin: 8.8 g/dL — ABNORMAL LOW (ref 12.0–15.0)
MCH: 34.8 pg — ABNORMAL HIGH (ref 26.0–34.0)
MCHC: 31.7 g/dL (ref 30.0–36.0)
MCV: 109.9 fL — ABNORMAL HIGH (ref 80.0–100.0)
Platelets: 71 10*3/uL — ABNORMAL LOW (ref 150–400)
RBC: 2.53 MIL/uL — ABNORMAL LOW (ref 3.87–5.11)
RDW: 15.9 % — ABNORMAL HIGH (ref 11.5–15.5)
WBC: 5.3 10*3/uL (ref 4.0–10.5)
nRBC: 0 % (ref 0.0–0.2)

## 2021-08-18 LAB — RESP PANEL BY RT-PCR (FLU A&B, COVID) ARPGX2
Influenza A by PCR: NEGATIVE
Influenza B by PCR: NEGATIVE
SARS Coronavirus 2 by RT PCR: NEGATIVE

## 2021-08-18 LAB — CBC WITH DIFFERENTIAL/PLATELET
Abs Immature Granulocytes: 0.02 10*3/uL (ref 0.00–0.07)
Basophils Absolute: 0 10*3/uL (ref 0.0–0.1)
Basophils Relative: 1 %
Eosinophils Absolute: 0.1 10*3/uL (ref 0.0–0.5)
Eosinophils Relative: 2 %
HCT: 31.4 % — ABNORMAL LOW (ref 36.0–46.0)
Hemoglobin: 10.1 g/dL — ABNORMAL LOW (ref 12.0–15.0)
Immature Granulocytes: 0 %
Lymphocytes Relative: 12 %
Lymphs Abs: 0.6 10*3/uL — ABNORMAL LOW (ref 0.7–4.0)
MCH: 35.8 pg — ABNORMAL HIGH (ref 26.0–34.0)
MCHC: 32.2 g/dL (ref 30.0–36.0)
MCV: 111.3 fL — ABNORMAL HIGH (ref 80.0–100.0)
Monocytes Absolute: 0.3 10*3/uL (ref 0.1–1.0)
Monocytes Relative: 5 %
Neutro Abs: 3.9 10*3/uL (ref 1.7–7.7)
Neutrophils Relative %: 80 %
Platelets: 74 10*3/uL — ABNORMAL LOW (ref 150–400)
RBC: 2.82 MIL/uL — ABNORMAL LOW (ref 3.87–5.11)
RDW: 16.2 % — ABNORMAL HIGH (ref 11.5–15.5)
WBC: 4.9 10*3/uL (ref 4.0–10.5)
nRBC: 0 % (ref 0.0–0.2)

## 2021-08-18 LAB — BASIC METABOLIC PANEL
Anion gap: 11 (ref 5–15)
Anion gap: 11 (ref 5–15)
BUN: 26 mg/dL — ABNORMAL HIGH (ref 6–20)
BUN: 30 mg/dL — ABNORMAL HIGH (ref 6–20)
CO2: 34 mmol/L — ABNORMAL HIGH (ref 22–32)
CO2: 31 mmol/L (ref 22–32)
Calcium: 9.2 mg/dL (ref 8.9–10.3)
Calcium: 8.9 mg/dL (ref 8.9–10.3)
Chloride: 91 mmol/L — ABNORMAL LOW (ref 98–111)
Chloride: 95 mmol/L — ABNORMAL LOW (ref 98–111)
Creatinine, Ser: 4.98 mg/dL — ABNORMAL HIGH (ref 0.44–1.00)
Creatinine, Ser: 5.03 mg/dL — ABNORMAL HIGH (ref 0.44–1.00)
GFR, Estimated: 10 mL/min — ABNORMAL LOW (ref >60)
GFR, Estimated: 10 mL/min — ABNORMAL LOW (ref >60)
Glucose, Bld: 192 mg/dL — ABNORMAL HIGH (ref 70–99)
Glucose, Bld: 93 mg/dL (ref 70–99)
Potassium: 3 mmol/L — ABNORMAL LOW (ref 3.5–5.1)
Potassium: 3.5 mmol/L (ref 3.5–5.1)
Sodium: 136 mmol/L (ref 135–145)
Sodium: 137 mmol/L (ref 135–145)

## 2021-08-18 LAB — TROPONIN I (HIGH SENSITIVITY)
Troponin I (High Sensitivity): 25 ng/L — ABNORMAL HIGH (ref <18)
Troponin I (High Sensitivity): 27 ng/L — ABNORMAL HIGH (ref <18)

## 2021-08-18 LAB — MAGNESIUM: Magnesium: 2.4 mg/dL (ref 1.7–2.4)

## 2021-08-18 LAB — HIV ANTIBODY (ROUTINE TESTING W REFLEX): HIV Screen 4th Generation wRfx: NONREACTIVE

## 2021-08-18 LAB — BRAIN NATRIURETIC PEPTIDE: B Natriuretic Peptide: 551.4 pg/mL — ABNORMAL HIGH (ref 0.0–100.0)

## 2021-08-18 MED ORDER — MAGNESIUM HYDROXIDE 400 MG/5ML PO SUSP: 30.0000 mL | Freq: Every day | ORAL | Status: DC | PRN

## 2021-08-18 MED ORDER — CINACALCET HCL 30 MG PO TABS: 30.0000 mg | ORAL_TABLET | ORAL | Status: DC

## 2021-08-18 MED ORDER — SPIRONOLACTONE 25 MG PO TABS
50.0000 mg | ORAL_TABLET | Freq: Every day | ORAL | Status: DC
  Administered 2021-08-18 – 2021-08-20 (×3): 50 mg via ORAL
  Filled 2021-08-18 (×3): qty 2

## 2021-08-18 MED ORDER — AMLODIPINE BESYLATE 10 MG PO TABS
10.0000 mg | ORAL_TABLET | Freq: Every day | ORAL | Status: DC
Start: 2021-08-18 — End: 2021-08-20
  Administered 2021-08-19: 10 mg via ORAL
  Filled 2021-08-18 (×2): qty 1

## 2021-08-18 MED ORDER — DOXERCALCIFEROL 4 MCG/2ML IV SOLN: 4.0000 ug | INTRAVENOUS | Status: DC

## 2021-08-18 MED ORDER — SODIUM CHLORIDE 0.9 % IV SOLN
100.0000 mg | INTRAVENOUS | Status: DC
  Filled 2021-08-18: qty 5

## 2021-08-18 MED ORDER — ONDANSETRON HCL 4 MG/2ML IJ SOLN: 4.0000 mg | Freq: Four times a day (QID) | INTRAMUSCULAR | Status: DC | PRN

## 2021-08-18 MED ORDER — SEVELAMER CARBONATE 800 MG PO TABS
1600.0000 mg | ORAL_TABLET | Freq: Three times a day (TID) | ORAL | Status: DC
  Administered 2021-08-18 – 2021-08-20 (×7): 1600 mg via ORAL
  Filled 2021-08-18 (×8): qty 2

## 2021-08-18 MED ORDER — ONDANSETRON HCL 4 MG PO TABS: 4.0000 mg | ORAL_TABLET | Freq: Four times a day (QID) | ORAL | Status: DC | PRN

## 2021-08-18 MED ORDER — LEVETIRACETAM 500 MG PO TABS
1000.0000 mg | ORAL_TABLET | Freq: Every day | ORAL | Status: DC
  Administered 2021-08-18 – 2021-08-20 (×3): 1000 mg via ORAL
  Filled 2021-08-18 (×3): qty 2

## 2021-08-18 MED ORDER — TRAZODONE HCL 50 MG PO TABS
25.0000 mg | ORAL_TABLET | Freq: Every evening | ORAL | Status: DC | PRN
  Administered 2021-08-19: 25 mg via ORAL
  Filled 2021-08-18 (×2): qty 1

## 2021-08-18 MED ORDER — ACETAMINOPHEN 325 MG PO TABS
650.0000 mg | ORAL_TABLET | Freq: Four times a day (QID) | ORAL | Status: DC | PRN
  Administered 2021-08-19: 650 mg via ORAL
  Filled 2021-08-18: qty 2

## 2021-08-18 MED ORDER — CARVEDILOL 25 MG PO TABS
25.0000 mg | ORAL_TABLET | Freq: Two times a day (BID) | ORAL | Status: DC
  Administered 2021-08-18 – 2021-08-20 (×5): 25 mg via ORAL
  Filled 2021-08-18: qty 1
  Filled 2021-08-18: qty 8
  Filled 2021-08-18 (×3): qty 1

## 2021-08-18 MED ORDER — POTASSIUM CHLORIDE 20 MEQ PO PACK
40.0000 meq | PACK | Freq: Once | ORAL | Status: AC
  Administered 2021-08-18: 40 meq via ORAL
  Filled 2021-08-18: qty 2

## 2021-08-18 MED ORDER — RENA-VITE PO TABS
ORAL_TABLET | Freq: Every day | ORAL | Status: DC
  Administered 2021-08-18 – 2021-08-20 (×3): 1 via ORAL
  Filled 2021-08-18 (×3): qty 1

## 2021-08-18 MED ORDER — HYDRALAZINE HCL 25 MG PO TABS
100.0000 mg | ORAL_TABLET | Freq: Three times a day (TID) | ORAL | Status: DC
Start: 2021-08-18 — End: 2021-08-20
  Administered 2021-08-18 – 2021-08-20 (×6): 100 mg via ORAL
  Filled 2021-08-18 (×7): qty 4

## 2021-08-18 MED ORDER — HEPARIN SODIUM (PORCINE) 5000 UNIT/ML IJ SOLN
5000.0000 [IU] | Freq: Three times a day (TID) | INTRAMUSCULAR | Status: DC
  Administered 2021-08-18 – 2021-08-20 (×5): 5000 [IU] via SUBCUTANEOUS
  Filled 2021-08-18 (×5): qty 1

## 2021-08-18 MED ORDER — FUROSEMIDE 10 MG/ML IJ SOLN
60.0000 mg | Freq: Two times a day (BID) | INTRAMUSCULAR | Status: DC
  Administered 2021-08-18 – 2021-08-20 (×5): 60 mg via INTRAVENOUS
  Filled 2021-08-18 (×5): qty 6

## 2021-08-18 MED ORDER — ACETAMINOPHEN 650 MG RE SUPP: 650.0000 mg | Freq: Four times a day (QID) | RECTAL | Status: DC | PRN

## 2021-08-18 NOTE — Assessment & Plan Note (Addendum)
-   The patient's last 2D echo on 03/26/2021 revealed an EF of 60 to 65% with grade 2 diastolic dysfunction, mild left atrial dilatation and moderate right atrial dilatation. ?- We will diurese with IV Lasix mainly for pulmonary congestion and minimal diuresis. ?- Nephrology consult will be obtained for hemodialysis.  Contact was made by ED physician. ?

## 2021-08-18 NOTE — Progress Notes (Signed)
PROGRESS NOTE    Madeline Brown  MEQ:[id number] DOB: 1976/01/08 DOA: 08/17/2021 PCP: Sonia Side., FNP   Brief Narrative:  Per admitting MD: Madeline Brown is a 46 y.o. African-American female with medical history significant for ESRD on HD on TTS, hypertension, seizure disorder, CVA and anemia of chronic disease, who presented to the ER with acute onset of worsening dyspnea over the last 3 days with associated palpitations since yesterday.  She denies any nausea or vomiting or abdominal pain.  She had her last hemodialysis session yesterday.  No chest pain or cough or wheezing or dyspnea.  No fever or chills.  She makes little urine.  The patient Pulsoxymeter was 35% on room air per EMS.  She was initially placed on 100% nonrebreather that was later tapered down to 4 L by nasal cannula in the ER.  ED Course: When she came to the ER BP was 135/92 with a heart rate of 102 and respiratory rate of 18 and later 24 pulse currently was 91% on 6 L of O2 by nasal cannula.  Labs revealed a BUN of 26 and creatinine 4.98 with a potassium of 3 and chloride 91.  CO2 is 34 and blood glucose 192.  BNP was 551.4 and high-sensitivity troponin I was 25 and later was 27.  CBC showed anemia better than previous levels and thrombocytopenia at 74.  The follows antigens and COVID-19 PCR came back negative.   Assessment & Plan:   Active Problems:   Acute respiratory failure with hypoxia (HCC)   Fluid overload   Acute on chronic diastolic CHF (congestive heart failure) (HCC)   Hypokalemia   Hypertension   ESRD (end stage renal disease) ([place])   Seizure disorder ([place])   Acute respiratory failure with hypoxia ([place]) - The patient was admitted to a medical telemetry bed.secondary to fluid overload with end-stage renal disease. - Nephrology consult placed , discussed this AM, plan for HD - O2 protocol will be followed.   Fluid overload - As above, in the setting of end-stage renal disease on hemodialysis on  TTS. - Planned HD today fro volume removal   Acute on chronic diastolic CHF (congestive heart failure) ([place]) - The patient's last 2D echo on 03/26/2021 revealed an EF of 60 to 65% with grade 2 diastolic dysfunction, mild left atrial dilatation and moderate right atrial dilatation. - Anticipate reolution with HD   Hypokalemia - Recheck labs in AM after HD   Hypertension - We will continue Norvasc and Coreg as well as hydralazine.   ESRD (end stage renal disease) [facility]) - Nephrology consult for hemodialysis. - We will continue Renvela.   Seizure disorder ([place]) - We will continue Keppra.  DVT prophylaxis: SCD/Compression stockings  Code Status: Full    Code Status Orders  (From admission, onward)           Start     Ordered   08/18/21 0244  Full code  Continuous        08/18/21 0246           Code Status History     Date Active Date Inactive Code Status Order ID Comments User Context   07/29/2021 0024 07/29/2021 2226 Full Code [id number]  Orene Desanctis, DO ED   03/25/2021 2201 03/27/2021 1613 Full Code [id number]  Barb Merino, MD ED   08/07/2020 0320 08/09/2020 1714 Full Code [id number]  Etta Quill, DO ED   07/30/2020 1743 08/01/2020 1820 Full Code [id number]  Ulyses Amor,  PA-C Inpatient   12/07/2019 2251 12/09/2019 1825 Full Code [id number]  Madalyn Rob, MD ED   02/19/2019 1743 02/21/2019 2118 Full Code [id number]  Asencion Noble, MD ED   07/11/2018 0148 07/13/2018 1801 Full Code [id number]  Phillips Grout, MD ED   07/09/2018 0505 07/09/2018 1604 Full Code [id number]  Rise Patience, MD ED   06/19/2017 0253 06/27/2017 1823 Full Code [id number]  Germain Osgood, PA-C ED   02/13/2017 0807 02/19/2017 2005 Full Code [id number]  Jani Gravel, MD ED      Family Communication: with patient today  Disposition Plan: Patient not medically stable for discharge, is volume overloaded and will be undergoing hemodialysis today  Consults called:  Nephrology Admission status:  Inpatient   Consultants:  As above  Procedures:  DG Chest Port 1 View  Result Date: 08/18/2021 CLINICAL DATA:  Shortness of breath EXAM: PORTABLE CHEST 1 VIEW COMPARISON:  07/28/2021 FINDINGS: Heart is enlarged in size. Lungs are well aerated bilaterally. Persistent airspace opacity is noted in the right base similar to that seen on the prior exam. Mild changes of central edema are noted as well. Bony abnormality is seen. IMPRESSION: Overall stable appearance of the chest when compared with the prior exam with edema and right basilar airspace opacity. Electronically Signed   By: Inez Catalina M.D.   On: 08/18/2021 00:23   DG Chest Port 1 View  Result Date: 07/28/2021 CLINICAL DATA:  Increased shortness of breath and hypoxia. EXAM: PORTABLE CHEST 1 VIEW COMPARISON:  Portable chest 03/25/2021 FINDINGS: There are numerous overlying monitor wires. Moderate to severe cardiomegaly again noted with again seen central vascular distension and flow cephalization and mild interstitial edema in the bases. There are bilateral minimal pleural effusions which were noted previously as well as the interstitial edema. Today there is also increased patchy airspace disease in the right lower lung field concerning for pneumonia versus asymmetric edema. The remaining lungs are clear of focal opacities. There is a stable mediastinal configuration. Mild thoracic dextroscoliosis. IMPRESSION: 1. Cardiomegaly with perihilar vascular prominence and mild interstitial edema. This could be from CHF or fluid overload and is similar to the last study as well as minimal pleural effusions. 2. Increased opacity in the right lower lung field concerning for superimposed pneumonia and / or asymmetric alveolar edema. Electronically Signed   By: Telford Nab M.D.   On: 07/28/2021 21:34    Antimicrobials:  None   Subjective: Patient reported being short of breath although mildly improved from admission   Objective: Vitals:    08/18/21 1100 08/18/21 1200 08/18/21 1249 08/18/21 1319  BP: 121/89 128/89 128/89 (!) 143/95  Pulse: 82 80 80 87  Resp: (!) 21 19 (!) 22 15  Temp:   98.6 F (37 C) 98.2 F (36.8 C)  TempSrc:   Oral Oral  SpO2: 97% 94% 94% 93%  Weight:      Height:       No intake or output data in the 24 hours ending 08/18/21 1414 Filed Weights   08/17/21 2348  Weight: 52.6 kg    Examination:  General exam: Appears calm and comfortable  Respiratory system: Mild rales no rhonchi no wheezing Cardiovascular system: S1 & S2 heard, RRR. No JVD, murmurs, rubs, gallops or clicks. No pedal edema. Gastrointestinal system: Abdomen is nondistended, soft and nontender. No organomegaly or masses felt. Normal bowel sounds heard. Central nervous system: Alert and oriented. No focal neurological deficits. Extremities: Trace edema, warm well perfused Skin: No rashes,  lesions or ulcers Psychiatry: Judgement and insight appear normal. Mood & affect appropriate.     Data Reviewed: I have personally reviewed following labs and imaging studies  CBC: Recent Labs  Lab 08/17/21 2354 08/18/21 0335  WBC 4.9 5.3  NEUTROABS 3.9  --   HGB 10.1* 8.8*  HCT 31.4* 27.8*  MCV 111.3* 109.9*  PLT 74* 71*   Basic Metabolic Panel: Recent Labs  Lab 08/17/21 2354 08/18/21 0335  NA 136 137  K 3.0* 3.5  CL 91* 95*  CO2 34* 31  GLUCOSE 192* 93  BUN 26* 30*  CREATININE 4.98* 5.03*  CALCIUM 9.2 8.9  MG  --  2.4   GFR: Estimated Creatinine Clearance: 10.3 mL/min (A) (by C-G formula based on SCr of 5.03 mg/dL (H)). Liver Function Tests: No results for input(s): AST, ALT, ALKPHOS, BILITOT, PROT, ALBUMIN in the last 168 hours. No results for input(s): LIPASE, AMYLASE in the last 168 hours. No results for input(s): AMMONIA in the last 168 hours. Coagulation Profile: No results for input(s): INR, PROTIME in the last 168 hours. Cardiac Enzymes: No results for input(s): CKTOTAL, CKMB, CKMBINDEX, TROPONINI in the last  168 hours. BNP (last 3 results) No results for input(s): PROBNP in the last 8760 hours. HbA1C: No results for input(s): HGBA1C in the last 72 hours. CBG: No results for input(s): GLUCAP in the last 168 hours. Lipid Profile: No results for input(s): CHOL, HDL, LDLCALC, TRIG, CHOLHDL, LDLDIRECT in the last 72 hours. Thyroid Function Tests: No results for input(s): TSH, T4TOTAL, FREET4, T3FREE, THYROIDAB in the last 72 hours. Anemia Panel: No results for input(s): VITAMINB12, FOLATE, FERRITIN, TIBC, IRON, RETICCTPCT in the last 72 hours. Sepsis Labs: No results for input(s): PROCALCITON, LATICACIDVEN in the last 168 hours.  Recent Results (from the past 240 hour(s))  Resp Panel by RT-PCR (Flu A&B, Covid) Nasopharyngeal Swab     Status: None   Collection Time: 08/17/21 11:54 PM   Specimen: Nasopharyngeal Swab; Nasopharyngeal(NP) swabs in vial transport medium  Result Value Ref Range Status   SARS Coronavirus 2 by RT PCR NEGATIVE NEGATIVE Final    Comment: (NOTE) SARS-CoV-2 target nucleic acids are NOT DETECTED.  The SARS-CoV-2 RNA is generally detectable in upper respiratory specimens during the acute phase of infection. The lowest concentration of SARS-CoV-2 viral copies this assay can detect is 138 copies/mL. A negative result does not preclude SARS-Cov-2 infection and should not be used as the sole basis for treatment or other patient management decisions. A negative result may occur with  improper specimen collection/handling, submission of specimen other than nasopharyngeal swab, presence of viral mutation(s) within the areas targeted by this assay, and inadequate number of viral copies(<138 copies/mL). A negative result must be combined with clinical observations, patient history, and epidemiological information. The expected result is Negative.  Fact Sheet for Patients:  EntrepreneurPulse.com.au  Fact Sheet for Healthcare Providers:   IncredibleEmployment.be  This test is no t yet approved or cleared by the [place] FDA and  has been authorized for detection and/or diagnosis of SARS-CoV-2 by FDA under an Emergency Use Authorization (EUA). This EUA will remain  in effect (meaning this test can be used) for the duration of the COVID-19 declaration under Section 564(b)(1) of the Act, 21 U.S.C.section 360bbb-3(b)(1), unless the authorization is terminated  or revoked sooner.       Influenza A by PCR NEGATIVE NEGATIVE Final   Influenza B by PCR NEGATIVE NEGATIVE Final    Comment: (NOTE) The Xpert Xpress  SARS-CoV-2/FLU/RSV plus assay is intended as an aid in the diagnosis of influenza from Nasopharyngeal swab specimens and should not be used as a sole basis for treatment. Nasal washings and aspirates are unacceptable for Xpert Xpress SARS-CoV-2/FLU/RSV testing.  Fact Sheet for Patients: EntrepreneurPulse.com.au  Fact Sheet for Healthcare Providers: IncredibleEmployment.be  This test is not yet approved or cleared by the [place] FDA and has been authorized for detection and/or diagnosis of SARS-CoV-2 by FDA under an Emergency Use Authorization (EUA). This EUA will remain in effect (meaning this test can be used) for the duration of the COVID-19 declaration under Section 564(b)(1) of the Act, 21 U.S.C. section 360bbb-3(b)(1), unless the authorization is terminated or revoked.  Performed at [facility], [place] [address] [place], [place] [address]          Radiology Studies: DG Chest Port 1 View  Result Date: 08/18/2021 CLINICAL DATA:  Shortness of breath EXAM: PORTABLE CHEST 1 VIEW COMPARISON:  07/28/2021 FINDINGS: Heart is enlarged in size. Lungs are well aerated bilaterally. Persistent airspace opacity is noted in the right base similar to that seen on the prior exam. Mild changes of central edema are noted as well. Bony abnormality is  seen. IMPRESSION: Overall stable appearance of the chest when compared with the prior exam with edema and right basilar airspace opacity. Electronically Signed   By: Inez Catalina M.D.   On: 08/18/2021 00:23        Scheduled Meds:  amLODipine  10 mg Oral QHS   carvedilol  25 mg Oral BID WC   [START ON 08/20/2021] cinacalcet  30 mg Oral Q T,Th,Sa-HD   [START ON 08/20/2021] doxercalciferol  4 mcg Intravenous Q T,Th,Sa-HD   furosemide  60 mg Intravenous Q12H   heparin  5,000 Units Subcutaneous Q8H   hydrALAZINE  100 mg Oral TID   levETIRAcetam  1,000 mg Oral Daily   multivitamin   Oral Daily   sevelamer carbonate  1,600 mg Oral TID WC   spironolactone  50 mg Oral Daily   Continuous Infusions:  [START ON 08/20/2021] iron sucrose       LOS: 0 days    Time spent: Camino Tassajara, MD [facility]  If 7PM-7AM, please contact night-coverage  08/18/2021, 2:14 PM

## 2021-08-18 NOTE — TOC Initial Note (Signed)
Transition of Care (TOC) - Initial/Assessment Note  ? ? ?Patient Details  ?Name: Madeline Brown ?MRN: [id number] ?Date of Birth: 16-Mar-1976 ? ?Transition of Care (TOC) CM/SW Contact:    ?Carles Collet, RN ?Phone Number: ?08/18/2021, 1:46 PM ? ?Clinical Narrative:          ?Patient from home. ?HD TTS established prior to admission. ?TOC will follow for DC needs. ?May need assistance with transportation.         ?PCP Sonia Side., FNP ?Cardiology Josue Hector, MD ? ? ? ?Expected Discharge Plan: Home/Self Care ?Barriers to Discharge: Continued Medical Work up ? ? ?Patient Goals and CMS Choice ?  ?  ?  ? ?Expected Discharge Plan and Services ?Expected Discharge Plan: Home/Self Care ?  ?Discharge Planning Services: CM Consult ?  ?Living arrangements for the past 2 months: Apartment ?                ?  ?  ?  ?  ?  ?  ?  ?  ?  ?  ? ?Prior Living Arrangements/Services ?Living arrangements for the past 2 months: Apartment ?  ?  ?       ?  ?  ?  ?  ? ?Activities of Daily Living ?  ?  ? ?Permission Sought/Granted ?  ?  ?   ?   ?   ?   ? ?Emotional Assessment ?  ?  ?  ?  ?  ?  ? ?Admission diagnosis:  Acute respiratory failure ([place]) [J96.00] ?Acute respiratory failure with hypoxemia ([place]) [J96.01] ?Patient Active Problem List  ? Diagnosis Date Noted  ? Acute on chronic diastolic CHF (congestive heart failure) ([place]) 08/18/2021  ? Pancytopenia ([place]) 07/29/2021  ? Volume overload 07/29/2021  ? Hypoxemia   ? Fluid overload 03/25/2021  ? Complication of vascular dialysis catheter 09/17/2020  ? Right lower lobe pneumonia 08/07/2020  ? Other mechanical complication of surgically created arteriovenous fistula, subsequent encounter 07/25/2020  ? Fluid overload, unspecified 12/09/2019  ? Acute respiratory failure with hypoxia ([place]) 12/08/2019  ? Hypertensive urgency 12/07/2019  ? 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 Current smoker 11/28/2019  ? History of intracranial hemorrhage 11/28/2019  ? Transplanted organ and tissue status,  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/15/2019  ? Intracerebral hemorrhage 02/21/2019  ? Intracranial hematoma 02/20/2019  ? Seizure disorder ([place]) 02/19/2019  ? Pain, unspecified 08/23/2018  ? SOB (shortness of breath) 07/15/2018  ? Pruritus, unspecified 10/28/2017  ? Headache, unspecified 09/26/2017  ? Anemia in chronic kidney disease 06/30/2017  ? Coagulation defect, unspecified ([place]) 06/30/2017  ? 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 Iron deficiency anemia, unspecified 06/30/2017  ? Encounter for fitting and adjustment of extracorporeal dialysis catheter ([place]) 06/30/2017  ? Secondary hyperparathyroidism of renal origin ([place]) 06/30/2017  ? ESRD (end stage renal disease) ([place]) 06/24/2017  ? Hypertension 03/23/2017  ? Anemia 02/12/2017  ? Hypokalemia 02/12/2017  ? ?PCP:  Sonia Side., FNP ?Pharmacy:   ?[place] - [address] ?[address] ?[place] [address] ?Phone: [phone] Fax: [phone] ? ?[facility] - [place], [place] - [address] ?[address] ?[place] [address], [address] ?[place] [address] ?Phone: [phone] Fax: [phone] ? ?[name] ?[address] ?[place] [address] ?Phone: [phone] Fax: [phone] ? ? ? ? ?Social Determinants of  Health (SDOH) Interventions ?  ? ?Readmission Risk Interventions ?No flowsheet data found. ? ? ?

## 2021-08-18 NOTE — Assessment & Plan Note (Signed)
-   Nephrology consult for hemodialysis. ?- We will continue Renvela. ?

## 2021-08-18 NOTE — Assessment & Plan Note (Signed)
>>  ASSESSMENT AND PLAN FOR ACUTE RESPIRATORY FAILURE WITH HYPOXIA (HCC) WRITTEN ON 08/18/2021  4:59 AM BY MANSY, JAN A, MD  - The patient will be admitted to a medical telemetry bed. - This is clearly secondary to fluid overload with end-stage renal disease. - Nephrology consult will be obtained.  This can be called in AM. - We will place her on IV Lasix  mainly for pulmonary congestion more than diuresis. - O2 protocol will be followed.

## 2021-08-18 NOTE — Assessment & Plan Note (Addendum)
-   This is in the setting of end-stage renal disease on hemodialysis on TTS. ?- Management as above with IV Lasix and nephrology consult for HD. ?

## 2021-08-18 NOTE — Assessment & Plan Note (Addendum)
-   The patient will be admitted to a medical telemetry bed. ?- This is clearly secondary to fluid overload with end-stage renal disease. ?- Nephrology consult will be obtained.  This can be called in AM. ?- We will place her on IV Lasix mainly for pulmonary congestion more than diuresis. ?- O2 protocol will be followed. ?

## 2021-08-18 NOTE — Progress Notes (Addendum)
Transported to hemodialysis by transporter via bed . Report to dialysis nurse Obas. Unable to give bedtime medications till pt returns due to dialysis treatment.  [phone] consent for the dialysis will be obtained by the dialysis nurse Obas prior to dialysis. Dialysis nurse ok to obstain consent for pt dialysis treatment tonight. Nurse did not get consent prior to transport picking up patient and unaware it needed to be obtained.  ?

## 2021-08-18 NOTE — H&P (Addendum)
[place]   PATIENT NAME: Madeline Brown    MR#:  [id number]  DATE OF BIRTH:  [birth year]  DATE OF ADMISSION:  08/17/2021  PRIMARY CARE PHYSICIAN: Sonia Side., FNP   Patient is coming from: Home  REQUESTING/REFERRING PHYSICIAN: Montine Circle, PA-C   CHIEF COMPLAINT:   Chief Complaint  Patient presents with   Shortness of Breath  -Palpitations  HISTORY OF PRESENT ILLNESS:  Madeline Brown is a 46 y.o. African-American female with medical history significant for ESRD on HD on TTS, hypertension, seizure disorder, CVA and anemia of chronic disease, who presented to the ER with acute onset of worsening dyspnea over the last 3 days with associated palpitations since yesterday.  She denies any nausea or vomiting or abdominal pain.  She had her last hemodialysis session yesterday.  No chest pain or cough or wheezing or dyspnea.  No fever or chills.  She makes little urine.  The patient Pulsoxymeter was 35% on room air per EMS.  She was initially placed on 100% nonrebreather that was later tapered down to 4 L by nasal cannula in the ER.  ED Course: When she came to the ER BP was 135/92 with a heart rate of 102 and respiratory rate of 18 and later 24 pulse currently was 91% on 6 L of O2 by nasal cannula.  Labs revealed a BUN of 26 and creatinine 4.98 with a potassium of 3 and chloride 91.  CO2 is 34 and blood glucose 192.  BNP was 551.4 and high-sensitivity troponin I was 25 and later was 27.  CBC showed anemia better than previous levels and thrombocytopenia at 74.  The follows antigens and COVID-19 PCR came back negative.  EKG as reviewed by me : EKG showed sinus tachycardia with rate 103 with biatrial enlargement Imaging: Portable chest ray showed pulmonary edema and right basilar airspace opacity.  The patient will be admitted to a medical telemetry bed for further evaluation and management. PAST MEDICAL HISTORY:   Past Medical History:  Diagnosis Date   Anemia    Chronic  kidney disease    Headache, unspecified 09/26/2017   History of blood transfusion 07/2020   Hypertension    Seizure ([place]) 02/2019   Shortness of breath 07/15/2018   01/16/21 not currently   Stroke ([place])    Mild was told after having a seizure    PAST SURGICAL HISTORY:   Past Surgical History:  Procedure Laterality Date   AV FISTULA PLACEMENT Left 06/24/2017   Procedure: CREATION OF LEFT ARM BRACHIALCEPHALIC  ARTERIOVENOUS (AV) FISTULA;  Surgeon: Rosetta Posner, MD;  Location: [facility];  Service: Vascular;  Laterality: Left;   CESAREAN SECTION     x2   COMPLEX WOUND CLOSURE Left 07/30/2020   Procedure: COMPLEX WOUND CLOSURE;  Surgeon: Cherre Robins, MD;  Location: [facility];  Service: Vascular;  Laterality: Left;   FISTULA SUPERFICIALIZATION Left 10/28/2017   Procedure: SUPERFICIALIZATION LEFT BRACHIOCEPHALIC ARTERIOVENOUS FISTULA;  Surgeon: Rosetta Posner, MD;  Location: [facility];  Service: Vascular;  Laterality: Left;   INGUINAL HERNIA REPAIR Bilateral 02/01/2021   Procedure: LAPAROSCOPIC BILATERAL INGUINAL HERNIA REPAIR WITH MESH;  Surgeon: Kinsinger, Arta Bruce, MD;  Location: [facility];  Service: General;  Laterality: Bilateral;   INSERTION OF DIALYSIS CATHETER Right 06/24/2017   Procedure: INSERTION OF TUNNELED DIALYSIS CATHETER;  Surgeon: Rosetta Posner, MD;  Location: [facility];  Service: Vascular;  Laterality: Right;   INSERTION OF DIALYSIS CATHETER Right 07/30/2020  Procedure: INSERTION OF DIALYSIS CATHETER;  Surgeon: Cherre Robins, MD;  Location: [facility];  Service: Vascular;  Laterality: Right;   LIGATION OF COMPETING BRANCHES OF ARTERIOVENOUS FISTULA  10/28/2017   Procedure: LIGATION OF COMPETING BRANCHES OF ARTERIOVENOUS FISTULA x3;  Surgeon: Rosetta Posner, MD;  Location: [facility];  Service: Vascular;;   REVISON OF ARTERIOVENOUS FISTULA Left 07/30/2020   Procedure: LEFT UPPER EXTREMITY ARTERIOVENOUS FISTULA REVISON;  Surgeon: Cherre Robins, MD;  Location: [facility];  Service: Vascular;  Laterality:  Left;  PERIPHERAL NERVE BLOCK    SOCIAL HISTORY:   Social History   Tobacco Use   Smoking status: Every Day    Packs/day: 0.25    Years: 15.00    Pack years: 3.75    Types: Cigarettes   Smokeless tobacco: Never  Substance Use Topics   Alcohol use: Not Currently    Comment: occassional beer    FAMILY HISTORY:   Family History  Problem Relation Age of Onset   Diabetes Father    Hypertension Father    Cancer Father    Heart disease Father    Aneurysm Mother    Seizures Mother     DRUG ALLERGIES:   Allergies  Allergen Reactions   Visine [Tetrahydrozoline Hcl] Swelling    Eyes Swelling    REVIEW OF SYSTEMS:   ROS As per history of present illness. All pertinent systems were reviewed above. Constitutional, HEENT, cardiovascular, respiratory, GI, GU, musculoskeletal, neuro, psychiatric, endocrine, integumentary and hematologic systems were reviewed and are otherwise negative/unremarkable except for positive findings mentioned above in the HPI.   MEDICATIONS AT HOME:   Prior to Admission medications   Medication Sig Start Date End Date Taking? Authorizing Provider  acetaminophen (TYLENOL) 325 MG tablet Take 650 mg by mouth every 6 (six) hours as needed for mild pain or moderate pain (for dialysis treatment).   Yes [provider]  amLODipine (NORVASC) 10 MG tablet Take 1 tablet (10 mg total) by mouth at bedtime. 12/09/19 01/29/22 Yes Aslam, Loralyn Freshwater, MD  B Complex-C-Folic Acid (DIALYVITE PO) Take 1 tablet by mouth daily.   Yes [provider]  carvedilol (COREG) 25 MG tablet Take 1 tablet (25 mg total) by mouth 2 (two) times daily with a meal. Patient taking differently: Take 25 mg by mouth 2 (two) times daily with a meal. Patient states they take on Tuesdays, Thursdays, and Saturdays at 1100 and 2000 Take on Sundays, Mondays, Wednesdays, and Fridays at 0630 and 1800 12/09/19 01/29/22 Yes Aslam, Sadia, MD  hydrALAZINE (APRESOLINE) 100 MG tablet Take 1 tablet  (100 mg total) by mouth 3 (three) times daily. 12/09/19 01/30/23 Yes Aslam, Loralyn Freshwater, MD  levETIRAcetam (KEPPRA) 1000 MG tablet Take 1 tablet (1,000 mg total) by mouth 2 (two) times daily. Patient taking differently: Take 1,000 mg by mouth in the morning. 12/07/20  Yes Penumalli, Earlean Polka, MD  sevelamer carbonate (RENVELA) 800 MG tablet Take 2 tablets (1,600 mg total) by mouth 3 (three) times daily with meals. 02/21/19  Yes Madalyn Rob, MD  spironolactone (ALDACTONE) 50 MG tablet Take 1 tablet (50 mg total) by mouth daily. 12/09/19  Yes Aslam, Loralyn Freshwater, MD      VITAL SIGNS:  Blood pressure 124/86, pulse 89, temperature 99.6 F (37.6 C), temperature source Oral, resp. rate 16, height 5' (1.524 m), weight 52.6 kg, last menstrual period 01/06/2018, SpO2 95 %, unknown if currently breastfeeding.  PHYSICAL EXAMINATION:  Physical Exam  GENERAL:  46 y.o.-year-old African-American female patient lying in  the bed with no acute distress.  EYES: Pupils equal, round, reactive to light and accommodation. No scleral icterus. Extraocular muscles intact.  HEENT: Head atraumatic, normocephalic. Oropharynx and nasopharynx clear.  NECK:  Supple, no jugular venous distention. No thyroid enlargement, no tenderness.  LUNGS: Slightly diminished bibasilar breath sounds with bibasilar rales.  No use of accessory muscles of respiration.  CARDIOVASCULAR: Regular rate and rhythm, S1, S2 normal. No murmurs, rubs, or gallops.  ABDOMEN: Soft, nondistended, nontender. Bowel sounds present. No organomegaly or mass.  EXTREMITIES: No pedal edema, cyanosis, or clubbing.  NEUROLOGIC: Cranial nerves II through XII are intact. Muscle strength 5/5 in all extremities. Sensation intact. Gait not checked.  PSYCHIATRIC: The patient is alert and oriented x 3.  Normal affect and good eye contact. SKIN: No obvious rash, lesion, or ulcer.   LABORATORY PANEL:   CBC Recent Labs  Lab 08/18/21 0335  WBC 5.3  HGB 8.8*  HCT 27.8*  PLT 71*    ------------------------------------------------------------------------------------------------------------------  Chemistries  Recent Labs  Lab 08/18/21 0335  NA 137  K 3.5  CL 95*  CO2 31  GLUCOSE 93  BUN 30*  CREATININE 5.03*  CALCIUM 8.9  MG 2.4   ------------------------------------------------------------------------------------------------------------------  Cardiac Enzymes No results for input(s): TROPONINI in the last 168 hours. ------------------------------------------------------------------------------------------------------------------  RADIOLOGY:  DG Chest Port 1 View  Result Date: 08/18/2021 CLINICAL DATA:  Shortness of breath EXAM: PORTABLE CHEST 1 VIEW COMPARISON:  07/28/2021 FINDINGS: Heart is enlarged in size. Lungs are well aerated bilaterally. Persistent airspace opacity is noted in the right base similar to that seen on the prior exam. Mild changes of central edema are noted as well. Bony abnormality is seen. IMPRESSION: Overall stable appearance of the chest when compared with the prior exam with edema and right basilar airspace opacity. Electronically Signed   By: Inez Catalina M.D.   On: 08/18/2021 00:23      IMPRESSION AND PLAN:  Assessment and Plan: Acute respiratory failure with hypoxia [facility]) - The patient will be admitted to a medical telemetry bed. - This is clearly secondary to fluid overload with end-stage renal disease. - Nephrology consult will be obtained.  This can be called in AM. - We will place her on IV Lasix mainly for pulmonary congestion more than diuresis. - O2 protocol will be followed.  Fluid overload - This is in the setting of end-stage renal disease on hemodialysis on TTS. - Management as above with IV Lasix and nephrology consult for HD.  Acute on chronic diastolic CHF (congestive heart failure) ([place]) - The patient's last 2D echo on 03/26/2021 revealed an EF of 60 to 65% with grade 2 diastolic dysfunction, mild left  atrial dilatation and moderate right atrial dilatation. - We will diurese with IV Lasix mainly for pulmonary congestion and minimal diuresis. - Nephrology consult will be obtained for hemodialysis.  Contact was made by ED physician.  Hypokalemia - We will replace potassium and check magnesium level.  Hypertension - We will continue Norvasc and Coreg as well as hydralazine.  ESRD (end stage renal disease) [facility]) - Nephrology consult for hemodialysis. - We will continue Renvela.  Seizure disorder ([place]) - We will continue Keppra.       DVT prophylaxis: SCDs.  Medical prophylaxis currently contraindicated due to thrombocytopenia.  Advanced Care Planning:  Code Status: full code. Family Communication:  The plan of care was discussed in details with the patient (and family). I answered all questions. The patient agreed to proceed with the above  mentioned plan. Further management will depend upon hospital course. Disposition Plan: Back to previous home environment Consults called: Nephrology consult will be obtained. All the records are reviewed and case discussed with ED provider.  Status is: Inpatient  At the time of the admission, it appears that the appropriate admission status for this patient is inpatient.  This is judged to be reasonable and necessary in order to provide the required intensity of service to ensure the patient's safety given the presenting symptoms, physical exam findings and initial radiographic and laboratory data in the context of comorbid conditions.  The patient requires inpatient status due to high intensity of service, high risk of further deterioration and high frequency of surveillance required.  I certify that at the time of admission, it is my clinical judgment that the patient will require inpatient hospital care extending more than 2 midnights.                            Dispo: The patient is from: Home              Anticipated d/c is to: Home               Patient currently is not medically stable to d/c.              Difficult to place patient: No  Christel Mormon M.D on 08/18/2021 at 4:59 AM  [facility]   From 7 PM-7 AM, contact night-coverage [email or web address]  CC: Primary care physician; Sonia Side., FNP

## 2021-08-18 NOTE — Plan of Care (Signed)

## 2021-08-18 NOTE — Assessment & Plan Note (Deleted)
The patient will be admitted to a medical telemetry bed. ?- This is clearly secondary to fluid overload with end-stage renal disease. ?- Nephrology consult will be obtained. ?- We will place on IV Lasix mainly for pulmonary congestion. ?- O2 protocol will be followed. ?

## 2021-08-18 NOTE — Assessment & Plan Note (Signed)
-  We will replace potassium and check magnesium level. 

## 2021-08-18 NOTE — Consult Note (Signed)
[facility] Renal Consultation Note  Indication for Consultation:  Management of ESRD/hemodialysis; anemia, hypertension/volume and secondary hyperparathyroidism  HPI: Madeline Brown is a 46 y.o. female hx of ESRD on HD TTS (EAST last hd yest )HTN seizure disorder, CVA, anemia of chronic disease.    She reports after dialysis last night she was awoken with palpitations/SOB she called EMS, noted pulse ox35% on room air per EMS placed on rebreather mask, and AVR BP 135/92, HR 102, O2 sat 91% on 6 L nasal cannula , chest x-ray showed pulmonary edema right basilar airspace opacity.  EKG sinus tach no acute changes.  COVID was negative.  Admit K3.0  in the setting of recent dialysis yesterday.  Recheck 3.5 Hgb 8.8 WBC 5.3  Noted she has not missed dialysis this week, has been leaving below her dry weight.  Reports dry cough.  Denies fever chills nausea vomiting diarrhea or chest pain.      Past Medical History:  Diagnosis Date   Anemia    Chronic kidney disease    Headache, unspecified 09/26/2017   History of blood transfusion 07/2020   Hypertension    Seizure ([place]) 02/2019   Shortness of breath 07/15/2018   01/16/21 not currently   Stroke [facility])    Mild was told after having a seizure    Past Surgical History:  Procedure Laterality Date   AV FISTULA PLACEMENT Left 06/24/2017   Procedure: CREATION OF LEFT ARM BRACHIALCEPHALIC  ARTERIOVENOUS (AV) FISTULA;  Surgeon: Rosetta Posner, MD;  Location: [facility];  Service: Vascular;  Laterality: Left;   CESAREAN SECTION     x2   COMPLEX WOUND CLOSURE Left 07/30/2020   Procedure: COMPLEX WOUND CLOSURE;  Surgeon: Cherre Robins, MD;  Location: [place] [facility];  Service: Vascular;  Laterality: Left;   FISTULA SUPERFICIALIZATION Left 10/28/2017   Procedure: SUPERFICIALIZATION LEFT BRACHIOCEPHALIC ARTERIOVENOUS FISTULA;  Surgeon: Rosetta Posner, MD;  Location: [facility];  Service: Vascular;  Laterality: Left;   INGUINAL HERNIA REPAIR Bilateral 02/01/2021    Procedure: LAPAROSCOPIC BILATERAL INGUINAL HERNIA REPAIR WITH MESH;  Surgeon: Kinsinger, Arta Bruce, MD;  Location: [facility];  Service: General;  Laterality: Bilateral;   INSERTION OF DIALYSIS CATHETER Right 06/24/2017   Procedure: INSERTION OF TUNNELED DIALYSIS CATHETER;  Surgeon: Rosetta Posner, MD;  Location: [facility];  Service: Vascular;  Laterality: Right;   INSERTION OF DIALYSIS CATHETER Right 07/30/2020   Procedure: INSERTION OF DIALYSIS CATHETER;  Surgeon: Cherre Robins, MD;  [facility];  Service: Vascular;  Laterality: Right;   LIGATION OF COMPETING BRANCHES OF ARTERIOVENOUS FISTULA  10/28/2017   Procedure: LIGATION OF COMPETING BRANCHES OF ARTERIOVENOUS FISTULA x3;  Surgeon: Rosetta Posner, MD;  Location: [facility];  Service: Vascular;;   REVISON OF ARTERIOVENOUS FISTULA Left 07/30/2020   Procedure: LEFT UPPER EXTREMITY ARTERIOVENOUS FISTULA REVISON;  Surgeon: Cherre Robins, MD;  [facility];  Service: Vascular;  Laterality: Left;  PERIPHERAL NERVE BLOCK      Family History  Problem Relation Age of Onset   Diabetes Father    Hypertension Father    Cancer Father    Heart disease Father    Aneurysm Mother    Seizures Mother       reports that she has been smoking cigarettes. She has a 3.75 pack-year smoking history. She has never used smokeless tobacco. She reports that she does not currently use alcohol. She reports current drug use. Drug: Marijuana.   Allergies  Allergen Reactions  Visine [Tetrahydrozoline Hcl] Swelling    Eyes Swelling    Prior to Admission medications   Medication Sig Start Date End Date Taking? Authorizing Provider  acetaminophen (TYLENOL) 325 MG tablet Take 650 mg by mouth every 6 (six) hours as needed for mild pain or moderate pain (for dialysis treatment).   Yes [provider]  amLODipine (NORVASC) 10 MG tablet Take 1 tablet (10 mg total) by mouth at bedtime. 12/09/19 01/29/22 Yes Aslam, Loralyn Freshwater, MD  B Complex-C-Folic Acid (DIALYVITE PO) Take 1  tablet by mouth daily.   Yes [provider]  carvedilol (COREG) 25 MG tablet Take 1 tablet (25 mg total) by mouth 2 (two) times daily with a meal. Patient taking differently: Take 25 mg by mouth 2 (two) times daily with a meal. Patient states they take on Tuesdays, Thursdays, and Saturdays at 1100 and 2000 Take on Sundays, Mondays, Wednesdays, and Fridays at 0630 and 1800 12/09/19 01/29/22 Yes Aslam, Sadia, MD  hydrALAZINE (APRESOLINE) 100 MG tablet Take 1 tablet (100 mg total) by mouth 3 (three) times daily. 12/09/19 01/30/23 Yes Aslam, Loralyn Freshwater, MD  levETIRAcetam (KEPPRA) 1000 MG tablet Take 1 tablet (1,000 mg total) by mouth 2 (two) times daily. Patient taking differently: Take 1,000 mg by mouth in the morning. 12/07/20  Yes Penumalli, Earlean Polka, MD  sevelamer carbonate (RENVELA) 800 MG tablet Take 2 tablets (1,600 mg total) by mouth 3 (three) times daily with meals. 02/21/19  Yes Madalyn Rob, MD  spironolactone (ALDACTONE) 50 MG tablet Take 1 tablet (50 mg total) by mouth daily. 12/09/19  Yes Aslam, Loralyn Freshwater, MD      Results for orders placed or performed during the hospital encounter of 08/17/21 (from the past 48 hour(s))  Resp Panel by RT-PCR (Flu A&B, Covid) Nasopharyngeal Swab     Status: None   Collection Time: 08/17/21 11:54 PM   Specimen: Nasopharyngeal Swab; Nasopharyngeal(NP) swabs in vial transport medium  Result Value Ref Range   SARS Coronavirus 2 by RT PCR NEGATIVE NEGATIVE    Comment: (NOTE) SARS-CoV-2 target nucleic acids are NOT DETECTED.  The SARS-CoV-2 RNA is generally detectable in upper respiratory specimens during the acute phase of infection. The lowest concentration of SARS-CoV-2 viral copies this assay can detect is 138 copies/mL. A negative result does not preclude SARS-Cov-2 infection and should not be used as the sole basis for treatment or other patient management decisions. A negative result may occur with  improper specimen collection/handling, submission of  specimen other than nasopharyngeal swab, presence of viral mutation(s) within the areas targeted by this assay, and inadequate number of viral copies(<138 copies/mL). A negative result must be combined with clinical observations, patient history, and epidemiological information. The expected result is Negative.  Fact Sheet for Patients:  EntrepreneurPulse.com.au  Fact Sheet for Healthcare Providers:  IncredibleEmployment.be  This test is no t yet approved or cleared by the [place] FDA and  has been authorized for detection and/or diagnosis of SARS-CoV-2 by FDA under an Emergency Use Authorization (EUA). This EUA will remain  in effect (meaning this test can be used) for the duration of the COVID-19 declaration under Section 564(b)(1) of the Act, 21 U.S.C.section 360bbb-3(b)(1), unless the authorization is terminated  or revoked sooner.       Influenza A by PCR NEGATIVE NEGATIVE   Influenza B by PCR NEGATIVE NEGATIVE    Comment: (NOTE) The Xpert Xpress SARS-CoV-2/FLU/RSV plus assay is intended as an aid in the diagnosis of influenza from Nasopharyngeal swab specimens and  should not be used as a sole basis for treatment. Nasal washings and aspirates are unacceptable for Xpert Xpress SARS-CoV-2/FLU/RSV testing.  Fact Sheet for Patients: EntrepreneurPulse.com.au  Fact Sheet for Healthcare Providers: IncredibleEmployment.be  This test is not yet approved or cleared by the [place] FDA and has been authorized for detection and/or diagnosis of SARS-CoV-2 by FDA under an Emergency Use Authorization (EUA). This EUA will remain in effect (meaning this test can be used) for the duration of the COVID-19 declaration under Section 564(b)(1) of the Act, 21 U.S.C. section 360bbb-3(b)(1), unless the authorization is terminated or revoked.  Performed at [facility], [place] [address] [place],  [place] [address]   CBC with Differential/Platelet     Status: Abnormal   Collection Time: 08/17/21 11:54 PM  Result Value Ref Range   WBC 4.9 4.0 - 10.5 K/uL   RBC 2.82 (L) 3.87 - 5.11 MIL/uL   Hemoglobin 10.1 (L) 12.0 - 15.0 g/dL   HCT 31.4 (L) 36.0 - 46.0 %   MCV 111.3 (H) 80.0 - 100.0 fL   MCH 35.8 (H) 26.0 - 34.0 pg   MCHC 32.2 30.0 - 36.0 g/dL   RDW 16.2 (H) 11.5 - 15.5 %   Platelets 74 (L) 150 - 400 K/uL    Comment: Immature Platelet Fraction may be clinically indicated, consider ordering this additional test [id number] REPEATED TO VERIFY PLATELET COUNT CONFIRMED BY SMEAR    nRBC 0.0 0.0 - 0.2 %   Neutrophils Relative % 80 %   Neutro Abs 3.9 1.7 - 7.7 K/uL   Lymphocytes Relative 12 %   Lymphs Abs 0.6 (L) 0.7 - 4.0 K/uL   Monocytes Relative 5 %   Monocytes Absolute 0.3 0.1 - 1.0 K/uL   Eosinophils Relative 2 %   Eosinophils Absolute 0.1 0.0 - 0.5 K/uL   Basophils Relative 1 %   Basophils Absolute 0.0 0.0 - 0.1 K/uL   Immature Granulocytes 0 %   Abs Immature Granulocytes 0.02 0.00 - 0.07 K/uL    Comment: Performed at [facility], [place] [address] [place], [place] [address]  Basic metabolic panel     Status: Abnormal   Collection Time: 08/17/21 11:54 PM  Result Value Ref Range   Sodium 136 135 - 145 mmol/L   Potassium 3.0 (L) 3.5 - 5.1 mmol/L   Chloride 91 (L) 98 - 111 mmol/L   CO2 34 (H) 22 - 32 mmol/L   Glucose, Bld 192 (H) 70 - 99 mg/dL    Comment: Glucose reference range applies only to samples taken after fasting for at least 8 hours.   BUN 26 (H) 6 - 20 mg/dL   Creatinine, Ser 4.98 (H) 0.44 - 1.00 mg/dL   Calcium 9.2 8.9 - 10.3 mg/dL   GFR, Estimated 10 (L) >60 mL/min    Comment: (NOTE) Calculated using the CKD-EPI Creatinine Equation (2021)    Anion gap 11 5 - 15    Comment: Performed at [facility] [address] [place], [place] [address]  Troponin I (High Sensitivity)     Status: Abnormal   Collection Time: 08/17/21 11:54 PM  Result Value Ref  Range   Troponin I (High Sensitivity) 25 (H) <18 ng/L    Comment: (NOTE) Elevated high sensitivity troponin I (hsTnI) values and significant  changes across serial measurements may suggest ACS but many other  chronic and acute conditions are known to elevate hsTnI results.  Refer to the "Links" section for chest  pain algorithms and additional  guidance. Performed at [facility], [place] [address], [place], [place] [address]   Brain natriuretic peptide     Status: Abnormal   Collection Time: 08/17/21 11:54 PM  Result Value Ref Range   B Natriuretic Peptide 551.4 (H) 0.0 - 100.0 pg/mL    Comment: Performed at [facility] [address], [place], [place] [address]  Troponin I (High Sensitivity)     Status: Abnormal   Collection Time: 08/18/21  1:54 AM  Result Value Ref Range   Troponin I (High Sensitivity) 27 (H) <18 ng/L    Comment: (NOTE) Elevated high sensitivity troponin I (hsTnI) values and significant  changes across serial measurements may suggest ACS but many other  chronic and acute conditions are known to elevate hsTnI results.  Refer to the "Links" section for chest pain algorithms and additional  guidance. Performed at [facility], [place] [address] [place], [place] [address]   Basic metabolic panel     Status: Abnormal   Collection Time: 08/18/21  3:35 AM  Result Value Ref Range   Sodium 137 135 - 145 mmol/L   Potassium 3.5 3.5 - 5.1 mmol/L   Chloride 95 (L) 98 - 111 mmol/L   CO2 31 22 - 32 mmol/L   Glucose, Bld 93 70 - 99 mg/dL    Comment: Glucose reference range applies only to samples taken after fasting for at least 8 hours.   BUN 30 (H) 6 - 20 mg/dL   Creatinine, Ser 5.03 (H) 0.44 - 1.00 mg/dL   Calcium 8.9 8.9 - 10.3 mg/dL   GFR, Estimated 10 (L) >60 mL/min    Comment: (NOTE) Calculated using the CKD-EPI Creatinine Equation (2021)    Anion gap 11 5 - 15    Comment: Performed at [facility] [address] [place], [place]  [address]  CBC     Status: Abnormal   Collection Time: 08/18/21  3:35 AM  Result Value Ref Range   WBC 5.3 4.0 - 10.5 K/uL   RBC 2.53 (L) 3.87 - 5.11 MIL/uL   Hemoglobin 8.8 (L) 12.0 - 15.0 g/dL   HCT 27.8 (L) 36.0 - 46.0 %   MCV 109.9 (H) 80.0 - 100.0 fL   MCH 34.8 (H) 26.0 - 34.0 pg   MCHC 31.7 30.0 - 36.0 g/dL   RDW 15.9 (H) 11.5 - 15.5 %   Platelets 71 (L) 150 - 400 K/uL    Comment: Immature Platelet Fraction may be clinically indicated, consider ordering this additional test [id number] CONSISTENT WITH PREVIOUS RESULT REPEATED TO VERIFY    nRBC 0.0 0.0 - 0.2 %    Comment: Performed at [facility], [place] [address], [place], [place] [address]  Magnesium     Status: None   Collection Time: 08/18/21  3:35 AM  Result Value Ref Range   Magnesium 2.4 1.7 - 2.4 mg/dL    Comment: Performed at [facility] [address] [place], [place] [address]  HIV Antibody (routine testing w rflx)     Status: None   Collection Time: 08/18/21  4:42 AM  Result Value Ref Range   HIV Screen 4th Generation wRfx Non Reactive Non Reactive    Comment: Performed at [facility], [place] [address], [place], [place] [address]  .  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  Stable  Dialysis Orders: [facility] EDW 53.0 on 2.0K, 2.0 CA Left UA AVF No heparin, Sensipar 30 mg Q dialysis,  Hectorol 4 mics IV q. dialysis on  . Mircera 100 mcg last given 08/17/21 Venofer 100 mg x 5 more doses  Assessment/Plan Pulmonary edema/volume overload vitamin = HD today attempt UF as tolerated, needed lower dry weight at discharge need standing weights ESRD -HD TTS schedule dialysis today because of volume overload the next dialysis would be Tuesday 3/07 Hypertension/volume  -volume overload as noted in 1 , UF as tolerated on HD, continue BP meds amlodipine spironolactone and hydralazine, follow-up BP trend may be able to taper back meds if BP lower with UF  Anemia  -Hgb 8.8 continue ESA, last given 3/04 ,and Venofer TTS HD x5 more doses, follow-up Hgb trend Metabolic bone disease -vitamin D with dialysis TTS and Sensipar, binders with meals Nutrition -diet renal vitamin  Ernest Haber, PA-C [facility] [phone] 08/18/2021, 10:34 AM      20 mg x 5 more doses,

## 2021-08-18 NOTE — Assessment & Plan Note (Signed)
>>  ASSESSMENT AND PLAN FOR FLUID OVERLOAD WRITTEN ON 08/18/2021  4:46 AM BY MANSY, JAN A, MD  - This is in the setting of end-stage renal disease on hemodialysis on TTS. - Management as above with IV Lasix  and nephrology consult for HD.

## 2021-08-18 NOTE — Assessment & Plan Note (Signed)
-   We will continue Norvasc and Coreg as well as hydralazine. ?

## 2021-08-18 NOTE — Assessment & Plan Note (Signed)
-   We will continue Keppra. 

## 2021-08-19 DIAGNOSIS — J9601 Acute respiratory failure with hypoxia: Secondary | ICD-10-CM | POA: Diagnosis not present

## 2021-08-19 LAB — BASIC METABOLIC PANEL
Anion gap: 9 (ref 5–15)
Anion gap: 10 (ref 5–15)
BUN: 9 mg/dL (ref 6–20)
BUN: 17 mg/dL (ref 6–20)
CO2: 27 mmol/L (ref 22–32)
CO2: 29 mmol/L (ref 22–32)
Calcium: 8.4 mg/dL — ABNORMAL LOW (ref 8.9–10.3)
Calcium: 9.8 mg/dL (ref 8.9–10.3)
Chloride: 98 mmol/L (ref 98–111)
Chloride: 94 mmol/L — ABNORMAL LOW (ref 98–111)
Creatinine, Ser: 2.33 mg/dL — ABNORMAL HIGH (ref 0.44–1.00)
Creatinine, Ser: 4 mg/dL — ABNORMAL HIGH (ref 0.44–1.00)
GFR, Estimated: 26 mL/min — ABNORMAL LOW (ref >60)
GFR, Estimated: 13 mL/min — ABNORMAL LOW (ref >60)
Glucose, Bld: 82 mg/dL (ref 70–99)
Glucose, Bld: 116 mg/dL — ABNORMAL HIGH (ref 70–99)
Potassium: 4.1 mmol/L (ref 3.5–5.1)
Potassium: 4 mmol/L (ref 3.5–5.1)
Sodium: 134 mmol/L — ABNORMAL LOW (ref 135–145)
Sodium: 133 mmol/L — ABNORMAL LOW (ref 135–145)

## 2021-08-19 MED ORDER — HEPARIN SODIUM (PORCINE) 1000 UNIT/ML DIALYSIS: 1000.0000 [IU] | INTRAMUSCULAR | Status: DC | PRN

## 2021-08-19 MED ORDER — SODIUM CHLORIDE 0.9 % IV SOLN: 100.0000 mL | INTRAVENOUS | Status: DC | PRN

## 2021-08-19 MED ORDER — LIDOCAINE HCL (PF) 1 % IJ SOLN: 5.0000 mL | INTRAMUSCULAR | Status: DC | PRN

## 2021-08-19 MED ORDER — LIDOCAINE-PRILOCAINE 2.5-2.5 % EX CREA: 1.0000 "application " | TOPICAL_CREAM | CUTANEOUS | Status: DC | PRN

## 2021-08-19 MED ORDER — PENTAFLUOROPROP-TETRAFLUOROETH EX AERO: 1.0000 "application " | INHALATION_SPRAY | CUTANEOUS | Status: DC | PRN

## 2021-08-19 MED ORDER — CHLORHEXIDINE GLUCONATE CLOTH 2 % EX PADS: 6.0000 | MEDICATED_PAD | Freq: Every day | CUTANEOUS | Status: DC

## 2021-08-19 MED ORDER — ALTEPLASE 2 MG IJ SOLR: 2.0000 mg | Freq: Once | INTRAMUSCULAR | Status: DC | PRN

## 2021-08-19 NOTE — Progress Notes (Signed)
SATURATION QUALIFICATIONS: (This note is used to comply with regulatory documentation for home oxygen) ? ?Patient Saturations on Room Air at Rest = 96% ? ?Patient Saturations on Room Air while Ambulating = 88% ? ?Patient Saturations on 2 Liters of oxygen while Ambulating = 96% ? ?Please briefly explain why patient needs home oxygen: pt dropped bellow 88% while ambulating  ?

## 2021-08-19 NOTE — Progress Notes (Signed)
Heart Failure Navigator Progress Note ? ?Assessed for Heart & Vascular TOC clinic readiness.  ?Patient does not meet criteria due to ESRD on HD TTS.  ? ?Earnestine Leys, BSN, RN ?Heart Failure Nurse Navigator ?[phone] ? ? ?

## 2021-08-19 NOTE — Discharge Instructions (Signed)
Follow with Primary MD Sonia Side., FNP in 7 days  ? ?Get CBC, CMP, 2 view Chest X ray -  checked next visit within 1 week by Primary MD   ? ?Activity: As tolerated with Full fall precautions use walker/cane & assistance as needed ? ?Disposition Home  ? ?Diet: Renal and heart healthy diet with strict 1.5 L fluid restriction per day. ? ?Check your Weight same time everyday, if you gain over 2 pounds, or you develop in leg swelling, experience more shortness of breath or chest pain, call your Primary MD immediately. Follow Cardiac Low Salt Diet and 1.5 lit/day fluid restriction. ? ?Special Instructions: If you have smoked or chewed Tobacco  in the last 2 yrs please stop smoking, stop any regular Alcohol  and or any Recreational drug use. ? ?On your next visit with your primary care physician please Get Medicines reviewed and adjusted. ? ?Please request your Prim.MD to go over all Hospital Tests and Procedure/Radiological results at the follow up, please get all Hospital records sent to your Prim MD by signing hospital release before you go home. ? ?If you experience worsening of your admission symptoms, develop shortness of breath, life threatening emergency, suicidal or homicidal thoughts you must seek medical attention immediately by calling 911 or calling your MD immediately  if symptoms less severe. ? ?You Must read complete instructions/literature along with all the possible adverse reactions/side effects for all the Medicines you take and that have been prescribed to you. Take any new Medicines after you have completely understood and accpet all the possible adverse reactions/side effects.  ? ?  ?

## 2021-08-19 NOTE — Discharge Summary (Signed)
Madeline Brown GNF:[id number] DOB: 08-28-1975 DOA: 08/17/2021  PCP: Sonia Side., FNP  Admit date: 08/17/2021  Discharge date: 08/19/2021  Admitted From: Home   Disposition:  Home   Recommendations for Outpatient Follow-up:   Follow up with PCP in 1-2 weeks  PCP Please obtain BMP/CBC, 2 view CXR in 1week,  (see Discharge instructions)   PCP Please follow up on the following pending results:    Home Health: None   Equipment/Devices: None  Consultations: Nephrology, dietitian Discharge Condition: Stable    CODE STATUS: Full    Diet Recommendation: Heart Healthy Renal with 1.5 L fluid restriction per day   Chief Complaint  Patient presents with   Shortness of Breath     Brief history of present illness from the day of admission and additional interim summary    46 y.o. African-American female with medical history significant for ESRD on HD on TTS, hypertension, seizure disorder, CVA and anemia of chronic disease, who presented to the ER with acute onset of worsening dyspnea over the last 3 days , apparently she was eating pizza on a regular basis for 2 to 3 days prior to hospital visit, she was diagnosed with fluid overload underwent urgent dialysis for her symptoms.                                                                 Hospital Course     Acute hypoxic respiratory failure due to fluid overload caused by dietary noncompliance.  Patient counseled on dietary compliance, underwent HD for extra fluid removal, currently much better and symptom-free.  Continue TTS schedule with outpatient nephrology follow-up.  Dietitian requested to educate patient on dietary compliance.  2.  Acute on chronic diastolic CHF EF 84%.  As above.  Continue home cardiac medications unchanged.  3.  ESRD.  On TTS schedule.  Seen  by nephrology, of note she is chronically on Aldactone defer this to primary care physician and primary nephrologist  4.  History of seizures.  On Keppra continue unchanged.  5.  New combination of Norvasc, Coreg and hydralazine.    Discharge diagnosis     Active Problems:   Acute respiratory failure with hypoxia (HCC)   Fluid overload   Acute on chronic diastolic CHF (congestive heart failure) (HCC)   Hypokalemia   Hypertension   ESRD (end stage renal disease) ([place])   Seizure disorder [facility])    Discharge instructions    Discharge Instructions     Discharge instructions   Complete by: As directed    Follow with Primary MD Sonia Side., FNP in 7 days   Get CBC, CMP, 2 view Chest X ray -  checked next visit within 1 week by Primary MD    Activity: As tolerated with Full fall precautions  use walker/cane & assistance as needed  Disposition Home   Diet: Renal and heart healthy diet with strict 1.5 L fluid restriction per day.  Check your Weight same time everyday, if you gain over 2 pounds, or you develop in leg swelling, experience more shortness of breath or chest pain, call your Primary MD immediately. Follow Cardiac Low Salt Diet and 1.5 lit/day fluid restriction.  Special Instructions: If you have smoked or chewed Tobacco  in the last 2 yrs please stop smoking, stop any regular Alcohol  and or any Recreational drug use.  On your next visit with your primary care physician please Get Medicines reviewed and adjusted.  Please request your Prim.MD to go over all Hospital Tests and Procedure/Radiological results at the follow up, please get all Hospital records sent to your Prim MD by signing hospital release before you go home.  If you experience worsening of your admission symptoms, develop shortness of breath, life threatening emergency, suicidal or homicidal thoughts you must seek medical attention immediately by calling 911 or calling your MD immediately  if symptoms  less severe.  You Must read complete instructions/literature along with all the possible adverse reactions/side effects for all the Medicines you take and that have been prescribed to you. Take any new Medicines after you have completely understood and accpet all the possible adverse reactions/side effects.   Increase activity slowly   Complete by: As directed        Discharge Medications   Allergies as of 08/19/2021       Reactions   Visine [tetrahydrozoline Hcl] Swelling   Eyes Swelling        Medication List     TAKE these medications    acetaminophen 325 MG tablet Commonly known as: TYLENOL Take 650 mg by mouth every 6 (six) hours as needed for mild pain or moderate pain (for dialysis treatment).   amLODipine 10 MG tablet Commonly known as: NORVASC Take 1 tablet (10 mg total) by mouth at bedtime.   carvedilol 25 MG tablet Commonly known as: COREG Take 1 tablet (25 mg total) by mouth 2 (two) times daily with a meal. What changed: additional instructions   DIALYVITE PO Take 1 tablet by mouth daily.   hydrALAZINE 100 MG tablet Commonly known as: APRESOLINE Take 1 tablet (100 mg total) by mouth 3 (three) times daily.   levETIRAcetam 1000 MG tablet Commonly known as: KEPPRA Take 1 tablet (1,000 mg total) by mouth 2 (two) times daily. What changed: when to take this   sevelamer carbonate 800 MG tablet Commonly known as: RENVELA Take 2 tablets (1,600 mg total) by mouth 3 (three) times daily with meals.   spironolactone 50 MG tablet Commonly known as: ALDACTONE Take 1 tablet (50 mg total) by mouth daily.         Follow-up Information     Sonia Side., FNP. Schedule an appointment as soon as possible for a visit in 1 week(s).   Specialty: Family Medicine Contact information: [place] [address] [phone]         Josue Hector, MD .   Specialty: Cardiology Contact information: [phone] [place] [address] [place] [address] [phone]                 Major procedures and Radiology Reports - PLEASE review detailed and final reports thoroughly  -       DG Chest Port 1 View  Result Date: 08/18/2021 CLINICAL DATA:  Shortness of breath EXAM: PORTABLE CHEST 1 VIEW COMPARISON:  07/28/2021 FINDINGS: Heart is enlarged in size. Lungs are well aerated bilaterally. Persistent airspace opacity is noted in the right base similar to that seen on the prior exam. Mild changes of central edema are noted as well. Bony abnormality is seen. IMPRESSION: Overall stable appearance of the chest when compared with the prior exam with edema and right basilar airspace opacity. Electronically Signed   By: Inez Catalina M.D.   On: 08/18/2021 00:23   DG Chest Port 1 View  Result Date: 07/28/2021 CLINICAL DATA:  Increased shortness of breath and hypoxia. EXAM: PORTABLE CHEST 1 VIEW COMPARISON:  Portable chest 03/25/2021 FINDINGS: There are numerous overlying monitor wires. Moderate to severe cardiomegaly again noted with again seen central vascular distension and flow cephalization and mild interstitial edema in the bases. There are bilateral minimal pleural effusions which were noted previously as well as the interstitial edema. Today there is also increased patchy airspace disease in the right lower lung field concerning for pneumonia versus asymmetric edema. The remaining lungs are clear of focal opacities. There is a stable mediastinal configuration. Mild thoracic dextroscoliosis. IMPRESSION: 1. Cardiomegaly with perihilar vascular prominence and mild interstitial edema. This could be from CHF or fluid overload and is similar to the last study as well as minimal pleural effusions. 2. Increased opacity in the right lower lung field concerning for superimposed pneumonia and / or asymmetric alveolar edema. Electronically Signed   By: Telford Nab M.D.   On: 07/28/2021 21:34      Today   Subjective    Madeline Brown today has  no headache,no chest abdominal pain,no new weakness tingling or numbness, feels much better wants to go home today.     Objective   Blood pressure (!) 133/92, pulse 83, temperature 99.1 F (37.3 C), temperature source Oral, resp. rate 20, height 5' (1.524 m), weight 52.6 kg, last menstrual period 01/06/2018, SpO2 96 %, unknown if currently breastfeeding.   Intake/Output Summary (Last 24 hours) at 08/19/2021 0950 Last data filed at 08/19/2021 0152 Gross per 24 hour  Intake 240 ml  Output 2000 ml  Net -1760 ml    Exam  Awake Alert, No new F.N deficits,    [place].AT,PERRAL Supple Neck,   Symmetrical Chest wall movement, Good air movement bilaterally, CTAB RRR,No Gallops,   +ve B.Sounds, Abd Soft, Non tender,  No Cyanosis, Clubbing or edema    Data Review   CBC w Diff:  Lab Results  Component Value Date   WBC 5.3 08/18/2021   HGB 8.8 (L) 08/18/2021   HGB 10.5 (L) 02/25/2017   HCT 27.8 (L) 08/18/2021   HCT 35.1 02/25/2017   PLT 71 (L) 08/18/2021   PLT 314 02/25/2017   LYMPHOPCT 12 08/17/2021   MONOPCT 5 08/17/2021   EOSPCT 2 08/17/2021   BASOPCT 1 08/17/2021    CMP:  Lab Results  Component Value Date   NA 134 (L) 08/19/2021   NA 142 02/25/2017   K 4.1 08/19/2021   CL 98 08/19/2021   CO2 27 08/19/2021   BUN 9 08/19/2021   BUN 42 (H) 02/25/2017   CREATININE 2.33 (H) 08/19/2021   PROT 7.0 07/28/2021   PROT 6.5 02/25/2017   ALBUMIN 3.4 (L) 07/29/2021   ALBUMIN 3.7 02/25/2017   BILITOT 1.8 (H) 07/28/2021   BILITOT <0.2 02/25/2017   ALKPHOS 39 07/28/2021   AST 15 07/28/2021   ALT 11 07/28/2021  .   Total Time in  preparing paper work, data evaluation and todays exam - 29 minutes  Lala Lund M.D on 08/19/2021 at 9:50 AM  [facility]

## 2021-08-19 NOTE — Progress Notes (Addendum)
?[facility] ?Progress Note  ? ?Subjective:    ?Seen and examined patient at bedside. Noted patient's O2 saturation dropped to 80s with ambulation. Discharge cancelled for today. Plan for HD today off schedule. More likely will not need HD tomorrow. Plan also to adjust EDW at discharge. ? ?Objective ?Vitals:  ? 08/19/21 1326 08/19/21 1330 08/19/21 1400 08/19/21 1430  ?BP: 120/85 119/84 114/81 110/81  ?Pulse: 79 81 82 85  ?Resp: 20 20 (!) 24 (!) 22  ?Temp: 97.9 ?F (36.6 ?C)     ?TempSrc:      ?SpO2: 98% 97% 96% 98%  ?Weight:      ?Height:      ? ?Physical Exam ?General: Well-appearing; NAD ?HEENT: No JVD ?Heart: S1 and S2; No murmurs, gallops, or rubs ?Lungs: Clear anteriorly, on 2L O2 via [place] ?Abdomen: Soft and non-tender ?Extremities: No edema BLLE ?Dialysis Access: L AVF (+) B/T  ? ?Filed Weights  ? 08/17/21 2348 08/18/21 2152 08/19/21 0152  ?Weight: 52.6 kg 54.6 kg 52.6 kg  ? ? ?Intake/Output Summary (Last 24 hours) at 08/19/2021 1457 ?Last data filed at 08/19/2021 0152 ?Gross per 24 hour  ?Intake 240 ml  ?Output 2000 ml  ?Net -1760 ml  ? ? ?Additional Objective ?Labs: ?Basic Metabolic Panel: ?Recent Labs  ?Lab 08/17/21 ?2354 08/18/21 ?2951 08/19/21 ?0242  ?NA 136 137 134*  ?K 3.0* 3.5 4.1  ?CL 91* 95* 98  ?CO2 34* 31 27  ?GLUCOSE 192* 93 82  ?BUN 26* 30* 9  ?CREATININE 4.98* 5.03* 2.33*  ?CALCIUM 9.2 8.9 8.4*  ? ?Liver Function Tests: ?No results for input(s): AST, ALT, ALKPHOS, BILITOT, PROT, ALBUMIN in the last 168 hours. ?No results for input(s): LIPASE, AMYLASE in the last 168 hours. ?CBC: ?Recent Labs  ?Lab 08/17/21 ?2354 08/18/21 ?0335  ?WBC 4.9 5.3  ?NEUTROABS 3.9  --   ?HGB 10.1* 8.8*  ?HCT 31.4* 27.8*  ?MCV 111.3* 109.9*  ?PLT 74* 71*  ? ?Blood Culture ?   ?Component Value Date/Time  ? [place] BLOOD RIGHT WRIST 08/07/2020 0044  ? SPECREQUEST  08/07/2020 0044  ?  BOTTLES DRAWN AEROBIC AND ANAEROBIC Blood Culture adequate volume  ? CULT  08/07/2020 0044  ?  NO GROWTH 5 DAYS ?Performed at [facility], [place] [address], [place], [place] [id number] ?  ? REPTSTATUS 08/12/2020 FINAL 08/07/2020 0044  ? ? ?Cardiac Enzymes: ?No results for input(s): CKTOTAL, CKMB, CKMBINDEX, TROPONINI in the last 168 hours. ?CBG: ?No results for input(s): GLUCAP in the last 168 hours. ?Iron Studies: No results for input(s): IRON, TIBC, TRANSFERRIN, FERRITIN in the last 72 hours. ?Lab Results  ?Component Value Date  ? INR 1.14 06/24/2017  ? INR 1.07 06/19/2017  ? INR 1.07 02/13/2017  ? ?Studies/Results: ?DG Chest Port 1 View ? ?Result Date: 08/18/2021 ?CLINICAL DATA:  Shortness of breath EXAM: PORTABLE CHEST 1 VIEW COMPARISON:  07/28/2021 FINDINGS: Heart is enlarged in size. Lungs are well aerated bilaterally. Persistent airspace opacity is noted in the right base similar to that seen on the prior exam. Mild changes of central edema are noted as well. Bony abnormality is seen. IMPRESSION: Overall stable appearance of the chest when compared with the prior exam with edema and right basilar airspace opacity. Electronically Signed   By: Inez Catalina M.D.   On: 08/18/2021 00:23   ? ?Medications: ? sodium chloride    ? sodium chloride    ? [START ON 08/20/2021] iron sucrose    ? ? amLODipine  10 mg Oral QHS  ? carvedilol  25 mg Oral BID WC  ? [START ON 08/20/2021] Chlorhexidine Gluconate Cloth  6 each Topical Q0600  ? [START ON 08/20/2021] cinacalcet  30 mg Oral Q T,Th,Sa-HD  ? furosemide  60 mg Intravenous Q12H  ? heparin  5,000 Units Subcutaneous Q8H  ? hydrALAZINE  100 mg Oral TID  ? levETIRAcetam  1,000 mg Oral Daily  ? multivitamin   Oral Daily  ? sevelamer carbonate  1,600 mg Oral TID WC  ? spironolactone  50 mg Oral Daily  ? ? ?Dialysis Orders: ?[facility] EDW 53.0 on 2.0K, 2.0 CA ?Left UA AVF ?No heparin, Sensipar 30 mg Q dialysis, Hectorol 4 mics IV q. dialysis on  . ?Mircera 100 mcg last given 08/17/21 ?Venofer 100 mg x 5 more doses ?  ?Assessment/Plan: ?Pulmonary edema/volume overload vitamin: Patient de-sated today  to 51s with ambulation. Plan for HD off schedule. Plan to lower dry weight at discharge need standing weights ?ESRD -HD TTS schedule. HD today off schedule. More likely will not need HD tomorrow ?Hypertension/volume  -volume overload as noted in 1 , UF as tolerated on HD, continue BP meds amlodipine spironolactone and hydralazine, follow-up BP trend may be able to taper back meds if BP lower with UF  ?Anemia  -Hgb 8.8 continue ESA, last given 3/04 ,and Venofer TTS HD x5 more doses, follow-up Hgb trend ?Metabolic bone disease -vitamin D with dialysis TTS and Sensipar, binders with meals ?Nutrition -diet renal vitamin ?Dispo: Discharge cancelled d/t de-saturation with ambulation. Patient will receive HD today and more likely will not need HD tomorrow. Will assess patient/volume status in AM to determine whether she can go home. ? ?Tobie Poet, NP ?[facility] ?08/19/2021,2:57 PM ? LOS: 1 day  ?  ?

## 2021-08-19 NOTE — TOC Initial Note (Addendum)
Transition of Care (TOC) - Initial/Assessment Note  ? ? ?Patient Details  ?Name: Madeline Brown ?MRN: [id number] ?Date of Birth: 1975/11/28 ? ?Transition of Care (TOC) CM/SW Contact:    ?Cyndi Bender, RN ?Phone Number: ?08/19/2021, 12:10 PM ? ?Clinical Narrative:                 ?Spoke to patient regarding transition needs. ?Patient states she has home 02 with [employer] but the order is about to end. She is requesting new order for home 02.  ?Patient states she can pay for a taxi home at discharge.  ?TOC will continue to follow for needs.  ? ?[place] ordered. Notified Freda Munro with [employer] ? ?Expected Discharge Plan: Home/Self Care ?Barriers to Discharge: Continued Medical Work up ? ? ?Patient Goals and CMS Choice ?Patient states their goals for this hospitalization and ongoing recovery are:: go home ?  ?  ? ?Expected Discharge Plan and Services ?Expected Discharge Plan: Home/Self Care ?  ?Discharge Planning Services: CM Consult ?  ?Living arrangements for the past 2 months: Apartment ?Expected Discharge Date: 08/19/21               ?  ?  ?  ?  ?  ?  ?  ?  ?  ?  ? ?Prior Living Arrangements/Services ?Living arrangements for the past 2 months: Apartment ?  ?Patient language and need for interpreter reviewed:: Yes ?Do you feel safe going back to the place where you live?: Yes      ?Need for Family Participation in Patient Care: Yes (Comment) ?Care giver support system in place?: Yes (comment) ?Current home services:  (home [phone] w/ [employer]) ?Criminal Activity/Legal Involvement Pertinent to Current Situation/Hospitalization: No - Comment as needed ? ?Activities of Daily Living ?Home Assistive Devices/Equipment: None ?ADL Screening (condition at time of admission) ?Patient's cognitive ability adequate to safely complete daily activities?: Yes ?Is the patient deaf or have difficulty hearing?: No ?Does the patient have difficulty seeing, even when wearing glasses/contacts?: No ?Does the patient have difficulty concentrating,  remembering, or making decisions?: No ?Patient able to express need for assistance with ADLs?: Yes ?Does the patient have difficulty dressing or bathing?: Yes ?Independently performs ADLs?: Yes (appropriate for developmental age) ?Does the patient have difficulty walking or climbing stairs?: No ?Weakness of Legs: None ?Weakness of Arms/Hands: None ? ?Permission Sought/Granted ?  ?  ?   ?   ?   ?   ? ?Emotional Assessment ?Appearance:: Appears stated age ?Attitude/Demeanor/Rapport: Engaged ?Affect (typically observed): Accepting ?Orientation: : Oriented to Self, Oriented to Place, Oriented to  Time, Oriented to Situation ?Alcohol / Substance Use: Not Applicable ?Psych Involvement: No (comment) ? ?Admission diagnosis:  Acute respiratory failure ([place]) [J96.00] ?Acute respiratory failure with hypoxemia ([place]) [J96.01] ?Patient Active Problem List  ? Diagnosis Date Noted  ? Acute on chronic diastolic CHF (congestive heart failure) ([place]) 08/18/2021  ? Pancytopenia ([place]) 07/29/2021  ? Volume overload 07/29/2021  ? Hypoxemia   ? Fluid overload 03/25/2021  ? Complication of vascular dialysis catheter 09/17/2020  ? Right lower lobe pneumonia 08/07/2020  ? Other mechanical complication of surgically created arteriovenous fistula, subsequent encounter 07/25/2020  ? Fluid overload, unspecified 12/09/2019  ? Acute respiratory failure with hypoxia ([place]) 12/08/2019  ? Hypertensive urgency 12/07/2019  ? 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 Current smoker 11/28/2019  ? History of intracranial hemorrhage 11/28/2019  ? Transplanted organ and tissue status, 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/15/2019  ? Intracerebral hemorrhage 02/21/2019  ? Intracranial hematoma 02/20/2019  ?  Seizure disorder ([place]) 02/19/2019  ? Pain, unspecified 08/23/2018  ? SOB (shortness of breath) 07/15/2018  ? Pruritus, unspecified 10/28/2017  ? Headache, unspecified 09/26/2017  ? Anemia in chronic kidney disease 06/30/2017  ? Coagulation defect, unspecified ([place]) 06/30/2017  ?  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 Iron deficiency anemia, unspecified 06/30/2017  ? Encounter for fitting and adjustment of extracorporeal dialysis catheter ([place]) 06/30/2017  ? Secondary hyperparathyroidism of renal origin ([place]) 06/30/2017  ? ESRD (end stage renal disease) ([place]) 06/24/2017  ? Hypertension 03/23/2017  ? Anemia 02/12/2017  ? Hypokalemia 02/12/2017  ? ?PCP:  Sonia Side., FNP ?Pharmacy:   ?[facility] ([place] - [address] ?[address] ?[place] [address] ?Phone: [phone] Fax: [phone] ? ?[facility] [place], [place] - [address] ?[address] ?[address], [address] ?[place] [address] ?Phone: [phone] Fax: [phone] ? ?[name] ?[address] ?[place] [address] ?Phone: [phone] Fax: [phone] ? ? ? ? ?Social Determinants of Health (SDOH) Interventions ?  ? ?Readmission Risk Interventions ?No flowsheet data found. ? ? ?

## 2021-08-19 NOTE — Progress Notes (Signed)
Received report from hemodialysis nurse pt completed dialysis no complications and took off two liters , vs stable will be returning to room ?

## 2021-08-20 LAB — BASIC METABOLIC PANEL WITH GFR
Anion gap: 12 (ref 5–15)
BUN: 27 mg/dL — ABNORMAL HIGH (ref 6–20)
CO2: 25 mmol/L (ref 22–32)
Calcium: 10.3 mg/dL (ref 8.9–10.3)
Chloride: 97 mmol/L — ABNORMAL LOW (ref 98–111)
Creatinine, Ser: 4.48 mg/dL — ABNORMAL HIGH (ref 0.44–1.00)
GFR, Estimated: 12 mL/min — ABNORMAL LOW
Glucose, Bld: 79 mg/dL (ref 70–99)
Potassium: 4.3 mmol/L (ref 3.5–5.1)
Sodium: 134 mmol/L — ABNORMAL LOW (ref 135–145)

## 2021-08-20 NOTE — Plan of Care (Addendum)
Nutrition Education Note ? ?RD consulted for Renal Education. Provided "Nutrition for People on Dialysis" and "All About Protein for People on HD" from the Academy of Nutrition and Dietetics. Reviewed food groups and provided written recommended serving sizes specifically determined for patient's current nutritional status.  ? ?Explained why diet restrictions are needed and provided lists of foods to limit/avoid that are high potassium, sodium, and phosphorus. Strongly encouraged compliance of this diet. Encouraged to read the nutrition label.  ? ?Discussed importance of protein intake at each meal and snack. Provided examples of how to maximize protein intake throughout the day. Discussed need for fluid restriction with dialysis, importance of minimizing weight gain between HD treatments, and renal-friendly beverage options. ? ?Provided pt with "Heart Failure Nutrition Therapy" handout as well and discussed importance of limiting sodium and monitoring fluid intake.  ? ?Encouraged pt to discuss specific diet questions/concerns with RD at HD outpatient facility. Teach back method used. ? ?Expect good compliance. ? ?Pt reports that her appetite has been "on and off" for a while now; states that she will have an appetite for a few days and eat well, then will not have one for a few days and will only eat a few bites of meals. Pt reports that she recently discussed trying a protein shake from [facility] with her RD at dialysis and is going to try this on the days she does not eat much. Discussed with pt to try small frequent meals or protein bars to insure adequate intake.  ? ?Current diet order is Heart Healthy, no meal completions documented within EMR. Labs and medications reviewed.  ? ?No further nutrition interventions warranted at this time. RD contact information provided. If additional nutrition issues arise, please re-consult RD. ? ? ?Hermina Barters RD, LDN ?Clinical Dietitian ?See AMiON for contact information.   ? ? ?

## 2021-08-20 NOTE — Progress Notes (Signed)
Pt receives out-pt HD at [facility] on TTS. Contacted clinic and spoke to [place]. Clinic aware pt was d/c today and will resume on Thursday.  ? ?Melven Sartorius ?Renal Navigator ?[phone] ?

## 2021-08-20 NOTE — Progress Notes (Signed)
?[facility] ?Progress Note  ? ?Subjective:    ?Seen and examined patient at bedside. No complaints. Now on RA. O2 saturations stable. Denies SOB and CP. Tolerated yesterday's HD with net UF 1.8L. Spoke to [employer]. Plan for discharge today.  ? ?Objective ?Vitals:  ? 08/19/21 2329 08/20/21 0345 08/20/21 0725 08/20/21 1141  ?BP: 114/81 118/87 118/83 121/89  ?Pulse: 80 88 84 81  ?Resp: '15 16 15 17  '$ ?Temp: 98.3 ?F (36.8 ?C) 98.6 ?F (37 ?C) 98.1 ?F (36.7 ?C) 98.4 ?F (36.9 ?C)  ?TempSrc: Oral Oral Oral Oral  ?SpO2: 92% 91% 93% 94%  ?Weight:      ?Height:      ? ?Physical Exam ?General: Well-appearing; NAD ?HEENT: No JVD ?Heart: S1 and S2; No murmurs, gallops, or rubs ?Lungs: Clear throughout; on RA ?Abdomen: Soft and non-tender ?Extremities: No edema BLLE ?Dialysis Access: L AVF (+) B/T  ? ?Filed Weights  ? 08/19/21 0152 08/19/21 1710  ?Weight: 52.6 kg 51.2 kg  ? ? ?Intake/Output Summary (Last 24 hours) at 08/20/2021 1233 ?Last data filed at 08/19/2021 1657 ?Gross per 24 hour  ?Intake --  ?Output 1800 ml  ?Net -1800 ml  ? ? ?Additional Objective ?Labs: ?Basic Metabolic Panel: ?Recent Labs  ?Lab 08/19/21 ?0242 08/19/21 ?2145 08/20/21 ?0155  ?NA 134* 133* 134*  ?K 4.1 4.0 4.3  ?CL 98 94* 97*  ?CO2 '27 29 25  '$ ?GLUCOSE 82 116* 79  ?BUN 9 17 27*  ?CREATININE 2.33* 4.00* 4.48*  ?CALCIUM 8.4* 9.8 10.3  ? ?Liver Function Tests: ?No results for input(s): AST, ALT, ALKPHOS, BILITOT, PROT, ALBUMIN in the last 168 hours. ?No results for input(s): LIPASE, AMYLASE in the last 168 hours. ?CBC: ?Recent Labs  ?Lab 08/17/21 ?2354 08/18/21 ?0335  ?WBC 4.9 5.3  ?NEUTROABS 3.9  --   ?HGB 10.1* 8.8*  ?HCT 31.4* 27.8*  ?MCV 111.3* 109.9*  ?PLT 74* 71*  ? ?Blood Culture ?   ?Component Value Date/Time  ? [place] BLOOD RIGHT WRIST 08/07/2020 0044  ? SPECREQUEST  08/07/2020 0044  ?  BOTTLES DRAWN AEROBIC AND ANAEROBIC Blood Culture adequate volume  ? CULT  08/07/2020 0044  ?  NO GROWTH 5 DAYS ?Performed at [facility], [place] [address], [place], [place] [address] ?  ? REPTSTATUS 08/12/2020 FINAL 08/07/2020 0044  ? ? ?Cardiac Enzymes: ?No results for input(s): CKTOTAL, CKMB, CKMBINDEX, TROPONINI in the last 168 hours. ?CBG: ?No results for input(s): GLUCAP in the last 168 hours. ?Iron Studies: No results for input(s): IRON, TIBC, TRANSFERRIN, FERRITIN in the last 72 hours. ?Lab Results  ?Component Value Date  ? INR 1.14 06/24/2017  ? INR 1.07 06/19/2017  ? INR 1.07 02/13/2017  ? ?Studies/Results: ?No results found. ? ?Medications: ? iron sucrose    ? ? amLODipine  10 mg Oral QHS  ? carvedilol  25 mg Oral BID WC  ? Chlorhexidine Gluconate Cloth  6 each Topical Q0600  ? cinacalcet  30 mg Oral Q T,Th,Sa-HD  ? furosemide  60 mg Intravenous Q12H  ? heparin  5,000 Units Subcutaneous Q8H  ? hydrALAZINE  100 mg Oral TID  ? levETIRAcetam  1,000 mg Oral Daily  ? multivitamin   Oral Daily  ? sevelamer carbonate  1,600 mg Oral TID WC  ? spironolactone  50 mg Oral Daily  ? ? ?Dialysis Orders: ?[facility] T[facility] EDW 53.0 on 2.0K, 2.0 CA ?Left UA AVF ?No heparin, Sensipar 30 mg Q dialysis, Hectorol 4 mics IV  q. dialysis on  . ?Mircera 100 mcg last given 08/17/21 ?Venofer 100 mg x 5 more doses ? ?Assessment/Plan: ?Pulmonary edema/volume overload vitamin: Received HD yesterday. Feeling much better. Now on RA and denies SOB. Plan for dc today. Will lower dry weight at discharge. ?ESRD -HD TTS schedule. Tolerated yesterday HD with net UF 1.8L. Resume HD in outpatient. ?Hypertension/volume  -volume overload as noted in 1 , UF as tolerated on HD, continue BP meds amlodipine spironolactone and hydralazine, follow-up BP trend may be able to taper back meds if BP lower with UF  ?Anemia  -Hgb 8.8 continue ESA, last given 3/04 ,and Venofer TTS HD x5 more doses, follow-up Hgb trend ?Metabolic bone disease -vitamin D with dialysis TTS and Sensipar, binders with meals ?Nutrition -diet renal vitamin ?Dispo: Okay for discharge from renal standpoint. Can resume HD in  outpatient. Will lower EDW in outpatient. ? ?Tobie Poet, NP ?[facility] ?08/20/2021,12:33 PM ? LOS: 2 days  ?  ?

## 2021-08-20 NOTE — Plan of Care (Signed)

## 2021-08-20 NOTE — Discharge Summary (Signed)
Madeline Brown HUT:[id number] DOB: [birth year] DOA: 08/17/2021  PCP: Sonia Side., FNP  Admit date: 08/17/2021  Discharge date: 08/20/2021  Admitted From: Home   Disposition:  Home   Recommendations for Outpatient Follow-up:   Follow up with PCP in 1-2 weeks  PCP Please obtain BMP/CBC, 2 view CXR in 1week,  (see Discharge instructions)   PCP Please follow up on the following pending results:    Home Health: None   Equipment/Devices: None  Consultations: Nephrology, dietitian Discharge Condition: Stable    CODE STATUS: Full    Diet Recommendation: Heart Healthy Renal with 1.5 L fluid restriction per day   Chief Complaint  Patient presents with   Shortness of Breath     Brief history of present illness from the day of admission and additional interim summary    46 y.o. African-American female with medical history significant for ESRD on HD on TTS, hypertension, seizure disorder, CVA and anemia of chronic disease, who presented to the ER with acute onset of worsening dyspnea over the last 3 days , apparently she was eating pizza on a regular basis for 2 to 3 days prior to hospital visit, she was diagnosed with fluid overload underwent urgent dialysis for her symptoms, she required dialysis 2 days back-to-back, she was dialyzed yesterday 3/6 given some dyspnea and her EDW has been adjusted.                                                                 Hospital Course     Acute hypoxic respiratory failure due to fluid overload caused by dietary noncompliance.  Patient counseled on dietary compliance, underwent HD for extra fluid removal, she was dialyzed 2 days back-to-back, as discussed with renal there is no need for dialysis today, she is good to continue her dialysis next Thursday on TTS schedule.  He is  currently on room air.  2.  Acute on chronic diastolic CHF EF 65%.  As above.  Continue home cardiac medications unchanged.  3.  ESRD.  On TTS schedule.  Seen by nephrology, of note she is chronically on Aldactone defer this to primary care physician and primary nephrologist, volume management with HD, her EDW has been lowered.  4.  History of seizures.  On Keppra continue unchanged.  5.  New combination of Norvasc, Coreg and hydralazine.    Discharge diagnosis     Active Problems:   Acute respiratory failure with hypoxia (HCC)   Fluid overload   Acute on chronic diastolic CHF (congestive heart failure) (HCC)   Hypokalemia   Hypertension   ESRD (end stage renal disease) ([employer])   Seizure disorder ([place])    Discharge instructions    Discharge Instructions     Diet - low sodium heart healthy   Complete by: As  directed    Discharge instructions   Complete by: As directed    Follow with Primary MD Sonia Side., FNP in 7 days   Get CBC, CMP, 2 view Chest X ray -  checked next visit within 1 week by Primary MD    Activity: As tolerated with Full fall precautions use walker/cane & assistance as needed  Disposition Home   Diet: Renal and heart healthy diet with strict 1.5 L fluid restriction per day.  Check your Weight same time everyday, if you gain over 2 pounds, or you develop in leg swelling, experience more shortness of breath or chest pain, call your Primary MD immediately. Follow Cardiac Low Salt Diet and 1.5 lit/day fluid restriction.  Special Instructions: If you have smoked or chewed Tobacco  in the last 2 yrs please stop smoking, stop any regular Alcohol  and or any Recreational drug use.  On your next visit with your primary care physician please Get Medicines reviewed and adjusted.  Please request your Prim.MD to go over all Hospital Tests and Procedure/Radiological results at the follow up, please get all Hospital records sent to your Prim MD by signing  hospital release before you go home.  If you experience worsening of your admission symptoms, develop shortness of breath, life threatening emergency, suicidal or homicidal thoughts you must seek medical attention immediately by calling 911 or calling your MD immediately  if symptoms less severe.  You Must read complete instructions/literature along with all the possible adverse reactions/side effects for all the Medicines you take and that have been prescribed to you. Take any new Medicines after you have completely understood and accpet all the possible adverse reactions/side effects.   Increase activity slowly   Complete by: As directed    Increase activity slowly   Complete by: As directed        Discharge Medications   Allergies as of 08/20/2021       Reactions   Visine [tetrahydrozoline Hcl] Swelling   Eyes Swelling        Medication List     TAKE these medications    acetaminophen 325 MG tablet Commonly known as: TYLENOL Take 650 mg by mouth every 6 (six) hours as needed for mild pain or moderate pain (for dialysis treatment).   amLODipine 10 MG tablet Commonly known as: NORVASC Take 1 tablet (10 mg total) by mouth at bedtime.   carvedilol 25 MG tablet Commonly known as: COREG Take 1 tablet (25 mg total) by mouth 2 (two) times daily with a meal. What changed: additional instructions   DIALYVITE PO Take 1 tablet by mouth daily.   hydrALAZINE 100 MG tablet Commonly known as: APRESOLINE Take 1 tablet (100 mg total) by mouth 3 (three) times daily.   levETIRAcetam 1000 MG tablet Commonly known as: KEPPRA Take 1 tablet (1,000 mg total) by mouth 2 (two) times daily. What changed: when to take this   sevelamer carbonate 800 MG tablet Commonly known as: RENVELA Take 2 tablets (1,600 mg total) by mouth 3 (three) times daily with meals.   spironolactone 50 MG tablet Commonly known as: ALDACTONE Take 1 tablet (50 mg total) by mouth daily.                Durable Medical Equipment  (From admission, onward)           Start     Ordered   08/19/21 1555  For home use only DME oxygen  Once  Question Answer Comment  Length of Need 12 Months   Mode or (Route) Nasal cannula   Liters per Minute 2   Oxygen conserving device Yes   Oxygen delivery system Gas      08/19/21 1555             Follow-up Information     Sonia Side., FNP. Schedule an appointment as soon as possible for a visit in 1 week(s).   Specialty: Family Medicine Contact information: [place] [address] [phone]         Josue Hector, MD .   Specialty: Cardiology Contact information: [phone] [address] [place] [address] [place] [address] [phone]                 Major procedures and Radiology Reports - PLEASE review detailed and final reports thoroughly  -       DG Chest Port 1 View  Result Date: 08/18/2021 CLINICAL DATA:  Shortness of breath EXAM: PORTABLE CHEST 1 VIEW COMPARISON:  07/28/2021 FINDINGS: Heart is enlarged in size. Lungs are well aerated bilaterally. Persistent airspace opacity is noted in the right base similar to that seen on the prior exam. Mild changes of central edema are noted as well. Bony abnormality is seen. IMPRESSION: Overall stable appearance of the chest when compared with the prior exam with edema and right basilar airspace opacity. Electronically Signed   By: Inez Catalina M.D.   On: 08/18/2021 00:23   DG Chest Port 1 View  Result Date: 07/28/2021 CLINICAL DATA:  Increased shortness of breath and hypoxia. EXAM: PORTABLE CHEST 1 VIEW COMPARISON:  Portable chest 03/25/2021 FINDINGS: There are numerous overlying monitor wires. Moderate to severe cardiomegaly again noted with again seen central vascular distension and flow cephalization and mild interstitial edema in the bases. There are bilateral minimal pleural effusions which were noted previously as well as the interstitial edema. Today  there is also increased patchy airspace disease in the right lower lung field concerning for pneumonia versus asymmetric edema. The remaining lungs are clear of focal opacities. There is a stable mediastinal configuration. Mild thoracic dextroscoliosis. IMPRESSION: 1. Cardiomegaly with perihilar vascular prominence and mild interstitial edema. This could be from CHF or fluid overload and is similar to the last study as well as minimal pleural effusions. 2. Increased opacity in the right lower lung field concerning for superimposed pneumonia and / or asymmetric alveolar edema. Electronically Signed   By: Telford Nab M.D.   On: 07/28/2021 21:34      Today   Subjective    Madeline Brown today denies any chest pain, or shortness of breath, she does report some leg cramps, but no dyspnea.       Objective   Blood pressure 118/83, pulse 84, temperature 98.1 F (36.7 C), temperature source Oral, resp. rate 15, height 5' (1.524 m), weight 51.2 kg, last menstrual period 01/06/2018, SpO2 93 %, unknown if currently breastfeeding.   Intake/Output Summary (Last 24 hours) at 08/20/2021 1102 Last data filed at 08/19/2021 1657 Gross per 24 hour  Intake --  Output 1800 ml  Net -1800 ml    Exam  Awake Alert, No new F.N deficits,    [place].AT,PERRAL Supple Neck,   Symmetrical Chest wall movement, Good air movement bilaterally, CTAB RRR,No Gallops,   +ve B.Sounds, Abd Soft, Non tender,  No Cyanosis, Clubbing or edema    Data Review   CBC w Diff:  Lab Results  Component Value Date  WBC 5.3 08/18/2021   HGB 8.8 (L) 08/18/2021   HGB 10.5 (L) 02/25/2017   HCT 27.8 (L) 08/18/2021   HCT 35.1 02/25/2017   PLT 71 (L) 08/18/2021   PLT 314 02/25/2017   LYMPHOPCT 12 08/17/2021   MONOPCT 5 08/17/2021   EOSPCT 2 08/17/2021   BASOPCT 1 08/17/2021    CMP:  Lab Results  Component Value Date   NA 134 (L) 08/20/2021   NA 142 02/25/2017   K 4.3 08/20/2021   CL 97 (L) 08/20/2021   CO2 25 08/20/2021    BUN 27 (H) 08/20/2021   BUN 42 (H) 02/25/2017   CREATININE 4.48 (H) 08/20/2021   PROT 7.0 07/28/2021   PROT 6.5 02/25/2017   ALBUMIN 3.4 (L) 07/29/2021   ALBUMIN 3.7 02/25/2017   BILITOT 1.8 (H) 07/28/2021   BILITOT <0.2 02/25/2017   ALKPHOS 39 07/28/2021   AST 15 07/28/2021   ALT 11 07/28/2021  .     Phillips Climes M.D on 08/20/2021 at 11:02 AM  [facility]

## 2021-08-20 NOTE — Progress Notes (Signed)
Patient given discharge instructions.  She is calling a cab once she finishes eating her lunch. ?

## 2021-10-07 ENCOUNTER — Encounter: Payer: Self-pay | Admitting: Physician Assistant

## 2021-10-07 NOTE — Progress Notes (Deleted)
Cardiology Office Note    Date:  10/07/2021   ID:  Eduardo Wurth, DOB 07/31/1975, MRN [id number]  PCP:  Sonia Side., FNP  Cardiologist:  Jenkins Rouge, MD  Electrophysiologist:  None   Chief Complaint: ***  History of Present Illness:   Madeline Brown is a 46 y.o. female with history of anemia, ESRD on HD, HTN, thrombocytopenia, tobacco abuse, LVH, pulmonary HTN, chronic diastolic CHF, pericardial effusion, left parietal hematoma/seizure 02/2019 who presents for follow-up.   She previously followed with Dr. Johnsie Cancel with history of pericardial effusion he felt was due to uremia. She also has h/o pulmonary HTN. She has had issues with her dialysis fistula. She also has a history of pulmonary HTN. Last echo 03/2021 showed EF 60-65%, severe LVH, G1DD, mildly elevated PASP, mild LAE, moderate RAE, trivial pericardial effusion. Her last admission was in 08/2021 for acute respiratory failure felt due to fluid overload caused by dietary noncompliance requiring back to back HD.  Sleep study?  Chronic diastolic CHF with severe LVH Pericardial effusion Pulmonary HTN Anemia, thrombocytopenia   Labwork independently reviewed: [address] Na 134, K 4.3, Cr 4.48, Hgb 8.8, plt 71, AST/ALT ok, Tbili 1.8 03/2019 LDL 68  Cardiology Studies:   Studies reviewed are outlined and summarized above. Reports included below if pertinent.   2d echo 03/2021 IMPRESSIONS   1. Left ventricular ejection fraction, by estimation, is 60 to 65%. Left  ventricular ejection fraction by 3D volume is 61 %. The left ventricle has  normal function. The left ventricle has no regional wall motion abnormalities. There is severe concentric left ventricular hypertrophy. Left ventricular diastolic parameters are  consistent with Grade II diastolic dysfunction (pseudonormalization).   2. Right ventricular systolic function is normal. The right ventricular  size is normal. Mildly increased right ventricular wall thickness. There   is mildly elevated pulmonary artery systolic pressure. The estimated right  ventricular systolic pressure is 71.6 mmHg.   3. Left atrial size was mildly dilated.   4. Right atrial size was moderately dilated.   5. The pericardial effusion is circumferential.   6. The mitral valve is normal in structure. No evidence of mitral valve  regurgitation.   7. The aortic valve is tricuspid. Aortic valve regurgitation is not  visualized.   8. The inferior vena cava is normal in size with greater than 50%  respiratory variability, suggesting right atrial pressure of 3 mmHg.   Comparison(s): A prior study was performed on 12/08/19. No significant  change from prior study.   Carotid duplex 02/2019 Summary:  Right Carotid: Velocities in the right ICA are consistent with a 1-39%  stenosis. Left Carotid: Velocities in the left ICA are consistent with a 1-39%  stenosis. Vertebrals: Bilateral vertebral arteries demonstrate antegrade flow.  *See table(s) above for measurements and observations.     Past Medical History:  Diagnosis Date   Anemia    Chronic kidney disease    Headache, unspecified 09/26/2017   History of blood transfusion 07/2020   Hypertension    Seizure ([place]) 02/2019   Shortness of breath 07/15/2018   01/16/21 not currently   Stroke [facility])    Mild was told after having a seizure    Past Surgical History:  Procedure Laterality Date   AV FISTULA PLACEMENT Left 06/24/2017   Procedure: CREATION OF LEFT ARM BRACHIALCEPHALIC  ARTERIOVENOUS (AV) FISTULA;  Surgeon: Rosetta Posner, MD;  Location: [place];  Service: Vascular;  Laterality: Left;   CESAREAN SECTION  x2   COMPLEX WOUND CLOSURE Left 07/30/2020   Procedure: COMPLEX WOUND CLOSURE;  Surgeon: Cherre Robins, MD;  Location[facility] OR;  Service: Vascular;  Laterality: Left;   FISTULA SUPERFICIALIZATION Left 10/28/2017   Procedure: SUPERFICIALIZATION LEFT BRACHIOCEPHALIC ARTERIOVENOUS FISTULA;  Surgeon: Rosetta Posner, MD;  Location:  [facility] OR;  Service: Vascular;  Laterality: Left;   INGUINAL HERNIA REPAIR Bilateral 02/01/2021   Procedure: LAPAROSCOPIC BILATERAL INGUINAL HERNIA REPAIR WITH MESH;  Surgeon: Kinsinger, Arta Bruce, MD;  Location: [facility];  Service: General;  Laterality: Bilateral;   INSERTION OF DIALYSIS CATHETER Right 06/24/2017   Procedure: INSERTION OF TUNNELED DIALYSIS CATHETER;  Surgeon: Rosetta Posner, MD;  Location: [facility] OR;  Service: Vascular;  Laterality: Right;   INSERTION OF DIALYSIS CATHETER Right 07/30/2020   Procedure: INSERTION OF DIALYSIS CATHETER;  Surgeon: Cherre Robins, MD;  Location: [facility] OR;  Service: Vascular;  Laterality: Right;   LIGATION OF COMPETING BRANCHES OF ARTERIOVENOUS FISTULA  10/28/2017   Procedure: LIGATION OF COMPETING BRANCHES OF ARTERIOVENOUS FISTULA x3;  Surgeon: Rosetta Posner, MD;  Location: [facility] OR;  Service: Vascular;;   REVISON OF ARTERIOVENOUS FISTULA Left 07/30/2020   Procedure: LEFT UPPER EXTREMITY ARTERIOVENOUS FISTULA REVISON;  Surgeon: Cherre Robins, MD;  Location: [facility] OR;  Service: Vascular;  Laterality: Left;  PERIPHERAL NERVE BLOCK    Current Medications: No outpatient medications have been marked as taking for the 10/08/21 encounter (Appointment) with Charlie Pitter, PA-C.   ***   Allergies:   Visine [tetrahydrozoline hcl]   Social History   Socioeconomic History   Marital status: Single    Spouse name: Not on file   Number of children: Not on file   Years of education: Not on file   Highest education level: Not on file  Occupational History   Not on file  Tobacco Use   Smoking status: Every Day    Packs/day: 0.25    Years: 15.00    Pack years: 3.75    Types: Cigarettes   Smokeless tobacco: Never  Vaping Use   Vaping Use: Never used  Substance and Sexual Activity   Alcohol use: Not Currently    Comment: occassional beer   Drug use: Yes    Types: Marijuana   Sexual activity: Yes    Birth control/protection: None  Other Topics Concern   Not on file   Social History Narrative   Not on file   Social Determinants of Health   Financial Resource Strain: Not on file  Food Insecurity: Not on file  Transportation Needs: Not on file  Physical Activity: Not on file  Stress: Not on file  Social Connections: Not on file     Family History:  The patient's ***family history includes Aneurysm in her mother; Cancer in her father; Diabetes in her father; Heart disease in her father; Hypertension in her father; Seizures in her mother.  ROS:   Please see the history of present illness. Otherwise, review of systems is positive for ***.  All other systems are reviewed and otherwise negative.    EKG(s)/Additional Labs   EKG:  EKG is ordered today, personally reviewed, demonstrating ***  Recent Labs: 07/28/2021: ALT 11 08/17/2021: B Natriuretic Peptide 551.4 08/18/2021: Hemoglobin 8.8; Magnesium 2.4; Platelets 71 08/20/2021: BUN 27; Creatinine, Ser 4.48; Potassium 4.3; Sodium 134  Recent Lipid Panel    Component Value Date/Time   CHOL 172 03/23/2019 0839   TRIG 55 03/23/2019 0839   HDL 93 03/23/2019 0839  CHOLHDL 1.8 03/23/2019 0839   CHOLHDL 2.7 02/21/2019 0425   VLDL 10 02/21/2019 0425   LDLCALC 68 03/23/2019 0839    PHYSICAL EXAM:    VS:  LMP 01/06/2018   BMI: There is no height or weight on file to calculate BMI.  GEN: Well nourished, well developed female in no acute distress HEENT: normocephalic, atraumatic Neck: no JVD, carotid bruits, or masses Cardiac: ***RRR; no murmurs, rubs, or gallops, no edema  Respiratory:  clear to auscultation bilaterally, normal work of breathing GI: soft, nontender, nondistended, + BS MS: no deformity or atrophy Skin: warm and dry, no rash Neuro:  Alert and Oriented x 3, Strength and sensation are intact, follows commands Psych: euthymic mood, full affect  Wt Readings from Last 3 Encounters:  08/19/21 112 lb 14 oz (51.2 kg)  07/29/21 117 lb 8.1 oz (53.3 kg)  03/26/21 126 lb 8.7 oz (57.4 kg)      ASSESSMENT & PLAN:   ***     Disposition: F/u with ***   Medication Adjustments/Labs and Tests Ordered: Current medicines are reviewed at length with the patient today.  Concerns regarding medicines are outlined above. Medication changes, Labs and Tests ordered today are summarized above and listed in the Patient Instructions accessible in Encounters.   Signed, Charlie Pitter, PA-C  10/07/2021 1:07 PM    [place] Phone: [phone]; Fax: [phone]

## 2021-10-08 ENCOUNTER — Ambulatory Visit: Payer: Medicare HMO | Admitting: Physician Assistant

## 2021-10-08 DIAGNOSIS — D696 Thrombocytopenia, unspecified: Secondary | ICD-10-CM

## 2021-10-08 DIAGNOSIS — I272 Pulmonary hypertension, unspecified: Secondary | ICD-10-CM

## 2021-10-08 DIAGNOSIS — I3139 Other pericardial effusion (noninflammatory): Secondary | ICD-10-CM

## 2021-10-08 DIAGNOSIS — I5032 Chronic diastolic (congestive) heart failure: Secondary | ICD-10-CM

## 2021-10-08 DIAGNOSIS — D649 Anemia, unspecified: Secondary | ICD-10-CM

## 2021-11-11 NOTE — Progress Notes (Deleted)
Cardiology Office Note    Date:  11/11/2021   ID:  Madeline Brown, DOB 10/11/1975, MRN [id number]  PCP:  Sonia Side., FNP  Cardiologist:  Jenkins Rouge, MD  Electrophysiologist:  None   Chief Complaint: ***  History of Present Illness:   Madeline Brown is a 46 y.o. female with history of anemia, ESRD on HD, HTN, thrombocytopenia, tobacco abuse, LVH, pulmonary HTN, chronic diastolic CHF, pericardial effusion, left parietal hematoma/seizure 02/2019 who presents for follow-up.   She previously followed with Dr. Johnsie Cancel with history of pericardial effusion he felt was due to uremia. She also has h/o pulmonary HTN. She has had issues with her dialysis fistula. Last echo 03/2021 showed EF 60-65%, severe LVH, G1DD, mildly elevated PASP, mild LAE, moderate RAE, trivial pericardial effusion. Her last admission was in 08/2021 for acute respiratory failure felt due to fluid overload caused by dietary noncompliance requiring back to back HD.   Sleep study?   Chronic diastolic CHF with severe LVH  Pericardial effusion  Pulmonary HTN  Anemia, thrombocytopenia   Labwork independently reviewed: 08/2021 Na 134, K 4.3, Cr 4.48, Hgb 8.8, plt 71, AST/ALT ok, Tbili 1.8  03/2019 LDL 68     Cardiology Studies:   Studies reviewed are outlined and summarized above. Reports included below if pertinent.   2d echo 03/2021 IMPRESSIONS   1. Left ventricular ejection fraction, by estimation, is 60 to 65%. Left  ventricular ejection fraction by 3D volume is 61 %. The left ventricle has  normal function. The left ventricle has no regional wall motion abnormalities. There is severe concentric left ventricular hypertrophy. Left ventricular diastolic parameters are  consistent with Grade II diastolic dysfunction (pseudonormalization).   2. Right ventricular systolic function is normal. The right ventricular  size is normal. Mildly increased right ventricular wall thickness. There  is mildly elevated  pulmonary artery systolic pressure. The estimated right  ventricular systolic pressure is 86.7 mmHg.   3. Left atrial size was mildly dilated.   4. Right atrial size was moderately dilated.   5. The pericardial effusion is circumferential.   6. The mitral valve is normal in structure. No evidence of mitral valve  regurgitation.   7. The aortic valve is tricuspid. Aortic valve regurgitation is not  visualized.   8. The inferior vena cava is normal in size with greater than 50%  respiratory variability, suggesting right atrial pressure of 3 mmHg.   Comparison(s): A prior study was performed on 12/08/19. No significant  change from prior study.   Carotid duplex 02/2019  Summary:  Right Carotid: Velocities in the right ICA are consistent with a 1-39%  stenosis.  Left Carotid: Velocities in the left ICA are consistent with a 1-39%  stenosis.  Vertebrals: Bilateral vertebral arteries demonstrate antegrade flow.  *See table(s) above for measurements and observations.     Past Medical History:  Diagnosis Date   Anemia    ESRD on HD    Headache, unspecified 09/26/2017   History of blood transfusion 07/2020   Hypertension    Intracranial hemorrhage (HCC)    LVH (left ventricular hypertrophy)    Pericardial effusion    Pulmonary hypertension ([place])    Seizure ([place]) 02/2019   Shortness of breath 07/15/2018   01/16/21 not currently   Stroke [facility])    Mild was told after having a seizure   Thrombocytopenia [facility])     Past Surgical History:  Procedure Laterality Date   AV FISTULA PLACEMENT Left 06/24/2017  Procedure: CREATION OF LEFT ARM BRACHIALCEPHALIC  ARTERIOVENOUS (AV) FISTULA;  Surgeon: Rosetta Posner, MD;  [facility];  Service: Vascular;  Laterality: Left;   CESAREAN SECTION     x2   COMPLEX WOUND CLOSURE Left 07/30/2020   Procedure: COMPLEX WOUND CLOSURE;  Surgeon: Cherre Robins, MD;  Location: [facility] OR;  Service: Vascular;  Laterality: Left;   FISTULA SUPERFICIALIZATION Left  10/28/2017   Procedure: SUPERFICIALIZATION LEFT BRACHIOCEPHALIC ARTERIOVENOUS FISTULA;  Surgeon: Rosetta Posner, MD;  Location: [facility] OR;  Service: Vascular;  Laterality: Left;   INGUINAL HERNIA REPAIR Bilateral 02/01/2021   Procedure: LAPAROSCOPIC BILATERAL INGUINAL HERNIA REPAIR WITH MESH;  Surgeon: Kinsinger, Arta Bruce, MD;  Location: [facility];  Service: General;  Laterality: Bilateral;   INSERTION OF DIALYSIS CATHETER Right 06/24/2017   Procedure: INSERTION OF TUNNELED DIALYSIS CATHETER;  Surgeon: Rosetta Posner, MD;  [facility]: [facility] OR;  Service: Vascular;  Laterality: Right;   INSERTION OF DIALYSIS CATHETER Right 07/30/2020   Procedure: INSERTION OF DIALYSIS CATHETER;  Surgeon: Cherre Robins, MD;  [facility];  Service: Vascular;  Laterality: Right;   LIGATION OF COMPETING BRANCHES OF ARTERIOVENOUS FISTULA  10/28/2017   Procedure: LIGATION OF COMPETING BRANCHES OF ARTERIOVENOUS FISTULA x3;  Surgeon: Rosetta Posner, MD;  [facility]: [facility];  Service: Vascular;;   REVISON OF ARTERIOVENOUS FISTULA Left 07/30/2020   Procedure: LEFT UPPER EXTREMITY ARTERIOVENOUS FISTULA REVISON;  Surgeon: Cherre Robins, MD;  [facility];  Service: Vascular;  Laterality: Left;  PERIPHERAL NERVE BLOCK    Current Medications: No outpatient medications have been marked as taking for the 11/12/21 encounter (Appointment) with Charlie Pitter, PA-C.   ***   Allergies:   Visine [tetrahydrozoline hcl]   Social History   Socioeconomic History   Marital status: Single    Spouse name: Not on file   Number of children: Not on file   Years of education: Not on file   Highest education level: Not on file  Occupational History   Not on file  Tobacco Use   Smoking status: Every Day    Packs/day: 0.25    Years: 15.00    Pack years: 3.75    Types: Cigarettes   Smokeless tobacco: Never  Vaping Use   Vaping Use: Never used  Substance and Sexual Activity   Alcohol use: Not Currently    Comment: occassional beer   Drug  use: Yes    Types: Marijuana   Sexual activity: Yes    Birth control/protection: None  Other Topics Concern   Not on file  Social History Narrative   Not on file   Social Determinants of Health   Financial Resource Strain: Not on file  Food Insecurity: Not on file  Transportation Needs: Not on file  Physical Activity: Not on file  Stress: Not on file  Social Connections: Not on file     Family History:  The patient's ***family history includes Aneurysm in her mother; Cancer in her father; Diabetes in her father; Heart disease in her father; Hypertension in her father; Seizures in her mother.  ROS:   Please see the history of present illness. Otherwise, review of systems is positive for ***.  All other systems are reviewed and otherwise negative.    EKG(s)/Additional Labs   EKG:  EKG is ordered today, personally reviewed, demonstrating ***  Recent Labs: 07/28/2021: ALT 11 08/17/2021: B Natriuretic Peptide 551.4 08/18/2021: Hemoglobin 8.8; Magnesium 2.4; Platelets 71 08/20/2021: BUN 27; Creatinine, Ser 4.48;  Potassium 4.3; Sodium 134  Recent Lipid Panel    Component Value Date/Time   CHOL 172 03/23/2019 0839   TRIG 55 03/23/2019 0839   HDL 93 03/23/2019 0839   CHOLHDL 1.8 03/23/2019 0839   CHOLHDL 2.7 02/21/2019 0425   VLDL 10 02/21/2019 0425   LDLCALC 68 03/23/2019 0839    PHYSICAL EXAM:    VS:  LMP 01/06/2018   BMI: There is no height or weight on file to calculate BMI.  GEN: Well nourished, well developed female in no acute distress HEENT: normocephalic, atraumatic Neck: no JVD, carotid bruits, or masses Cardiac: ***RRR; no murmurs, rubs, or gallops, no edema  Respiratory:  clear to auscultation bilaterally, normal work of breathing GI: soft, nontender, nondistended, + BS MS: no deformity or atrophy Skin: warm and dry, no rash Neuro:  Alert and Oriented x 3, Strength and sensation are intact, follows commands Psych: euthymic mood, full affect  Wt Readings from  Last 3 Encounters:  08/19/21 112 lb 14 oz (51.2 kg)  07/29/21 117 lb 8.1 oz (53.3 kg)  03/26/21 126 lb 8.7 oz (57.4 kg)     ASSESSMENT & PLAN:   ***     Disposition: F/u with ***   Medication Adjustments/Labs and Tests Ordered: Current medicines are reviewed at length with the patient today.  Concerns regarding medicines are outlined above. Medication changes, Labs and Tests ordered today are summarized above and listed in the Patient Instructions accessible in Encounters.   Signed, Charlie Pitter, PA-C  11/11/2021 9:28 AM    [place] Phone: [phone]; Fax: [phone]

## 2021-11-12 ENCOUNTER — Ambulatory Visit: Payer: Medicare HMO | Admitting: Physician Assistant

## 2021-11-12 DIAGNOSIS — D649 Anemia, unspecified: Secondary | ICD-10-CM

## 2021-11-12 DIAGNOSIS — D696 Thrombocytopenia, unspecified: Secondary | ICD-10-CM

## 2021-11-12 DIAGNOSIS — I3139 Other pericardial effusion (noninflammatory): Secondary | ICD-10-CM

## 2021-11-12 DIAGNOSIS — I5032 Chronic diastolic (congestive) heart failure: Secondary | ICD-10-CM

## 2021-11-12 DIAGNOSIS — I517 Cardiomegaly: Secondary | ICD-10-CM

## 2021-11-12 DIAGNOSIS — I272 Pulmonary hypertension, unspecified: Secondary | ICD-10-CM

## 2021-12-06 ENCOUNTER — Ambulatory Visit: Payer: Medicare HMO | Admitting: Physician Assistant

## 2021-12-06 NOTE — Progress Notes (Deleted)
Cardiology Office Note:    Date:  12/06/2021   ID:  Madeline Brown, DOB Jul 04, 1975, MRN [id number]  PCP:  Sonia Side., FNP  [facility] Cardiologist:  Jenkins Rouge, MD  [place] Electrophysiologist:  None   Chief Complaint: Hospital follow-up  History of Present Illness:    Madeline Brown is a 46 y.o. female with a hx of hypertension, end-stage renal disease on hemodialysis, seizure disorder, stroke, pericardial effusion and pulmonary hypertension seen for hospital follow-up.  Echocardiogram in 2020 with LV function of 60 to 65%, LVH, PA pressure 54 mmHg.  Small to moderate circumferential effusion without tamponade.  From likely related to uremia.  This was resolved on follow-up echo 11/2019.  Last echocardiogram October 2022 with LV function of 60 to 65%, no regional wall motion abnormality, severe concentric LVH, grade 2 diastolic dysfunction, mildly elevated pulmonary artery systolic pressure at 36 mmHg.  Circumferential pericardial effusion.  Admitted 08/2021 for acute hypoxic respiratory failure secondary to volume overload caused by dietary noncompliance.  She required additional dialysis.  Past Medical History:  Diagnosis Date   Anemia    ESRD on HD    Headache, unspecified 09/26/2017   History of blood transfusion 07/2020   Hypertension    Intracranial hemorrhage (HCC)    LVH (left ventricular hypertrophy)    Pericardial effusion    Pulmonary hypertension ([place])    Seizure ([place]) 02/2019   Shortness of breath 07/15/2018   01/16/21 not currently   Stroke [facility])    Mild was told after having a seizure   Thrombocytopenia ([place])     Past Surgical History:  Procedure Laterality Date   AV FISTULA PLACEMENT Left 06/24/2017   Procedure: CREATION OF LEFT ARM BRACHIALCEPHALIC  ARTERIOVENOUS (AV) FISTULA;  Surgeon: Rosetta Posner, MD;  Location: [place];  Service: Vascular;  Laterality: Left;   CESAREAN SECTION     x2   COMPLEX WOUND CLOSURE Left 07/30/2020   Procedure:  COMPLEX WOUND CLOSURE;  Surgeon: Cherre Robins, MD;  Location: [place];  Service: Vascular;  Laterality: Left;   FISTULA SUPERFICIALIZATION Left 10/28/2017   Procedure: SUPERFICIALIZATION LEFT BRACHIOCEPHALIC ARTERIOVENOUS FISTULA;  Surgeon: Rosetta Posner, MD;  Location: [place];  Service: Vascular;  Laterality: Left;   INGUINAL HERNIA REPAIR Bilateral 02/01/2021   Procedure: LAPAROSCOPIC BILATERAL INGUINAL HERNIA REPAIR WITH MESH;  Surgeon: Kinsinger, Arta Bruce, MD;  [facility]: [facility];  Service: General;  Laterality: Bilateral;   INSERTION OF DIALYSIS CATHETER Right 06/24/2017   Procedure: INSERTION OF TUNNELED DIALYSIS CATHETER;  Surgeon: Rosetta Posner, MD;  Location: [place];  Service: Vascular;  Laterality: Right;   INSERTION OF DIALYSIS CATHETER Right 07/30/2020   Procedure: INSERTION OF DIALYSIS CATHETER;  Surgeon: Cherre Robins, MD;  Location: [place];  Service: Vascular;  Laterality: Right;   LIGATION OF COMPETING BRANCHES OF ARTERIOVENOUS FISTULA  10/28/2017   Procedure: LIGATION OF COMPETING BRANCHES OF ARTERIOVENOUS FISTULA x3;  Surgeon: Rosetta Posner, MD;  Location: [place];  Service: Vascular;;   REVISON OF ARTERIOVENOUS FISTULA Left 07/30/2020   Procedure: LEFT UPPER EXTREMITY ARTERIOVENOUS FISTULA REVISON;  Surgeon: Cherre Robins, MD;  [facility];  Service: Vascular;  Laterality: Left;  PERIPHERAL NERVE BLOCK    Current Medications: No outpatient medications have been marked as taking for the 12/06/21 encounter (Appointment) with Leanor Kail, [place].     Allergies:   Visine [tetrahydrozoline hcl]   Social History   Socioeconomic History   Marital status: Single  Spouse name: Not on file   Number of children: Not on file   Years of education: Not on file   Highest education level: Not on file  Occupational History   Not on file  Tobacco Use   Smoking status: Every Day    Packs/day: 0.25    Years: 15.00    Total pack years: 3.75    Types: Cigarettes   Smokeless  tobacco: Never  Vaping Use   Vaping Use: Never used  Substance and Sexual Activity   Alcohol use: Not Currently    Comment: occassional beer   Drug use: Yes    Types: Marijuana   Sexual activity: Yes    Birth control/protection: None  Other Topics Concern   Not on file  Social History Narrative   Not on file   Social Determinants of Health   Financial Resource Strain: Not on file  Food Insecurity: Not on file  Transportation Needs: Not on file  Physical Activity: Not on file  Stress: Not on file  Social Connections: Not on file     Family History: The patient's family history includes Aneurysm in her mother; Cancer in her father; Diabetes in her father; Heart disease in her father; Hypertension in her father; Seizures in her mother.  ***  ROS:   Please see the history of present illness.    All other systems reviewed and are negative. ***  EKGs/Labs/Other Studies Reviewed:    The following studies were reviewed today:  Echo 03/2021 IMPRESSIONS   1. Left ventricular ejection fraction, by estimation, is 60 to 65%. Left  ventricular ejection fraction by 3D volume is 61 %. The left ventricle has  normal function. The left ventricle has no regional wall motion  abnormalities. There is severe concentric   left ventricular hypertrophy. Left ventricular diastolic parameters are  consistent with Grade II diastolic dysfunction (pseudonormalization).   2. Right ventricular systolic function is normal. The right ventricular  size is normal. Mildly increased right ventricular wall thickness. There  is mildly elevated pulmonary artery systolic pressure. The estimated right  ventricular systolic pressure is  00.1 mmHg.   3. Left atrial size was mildly dilated.   4. Right atrial size was moderately dilated.   5. The pericardial effusion is circumferential.   6. The mitral valve is normal in structure. No evidence of mitral valve  regurgitation.   7. The aortic valve is  tricuspid. Aortic valve regurgitation is not  visualized.   8. The inferior vena cava is normal in size with greater than 50%  respiratory variability, suggesting right atrial pressure of 3 mmHg.   Comparison(s): A prior study was performed on 12/08/19. No significant  change from prior study.   EKG:  EKG is *** ordered today.  The ekg ordered today demonstrates ***  Recent Labs: 07/28/2021: ALT 11 08/17/2021: B Natriuretic Peptide 551.4 08/18/2021: Hemoglobin 8.8; Magnesium 2.4; Platelets 71 08/20/2021: BUN 27; Creatinine, Ser 4.48; Potassium 4.3; Sodium 134  Recent Lipid Panel    Component Value Date/Time   CHOL 172 03/23/2019 0839   TRIG 55 03/23/2019 0839   HDL 93 03/23/2019 0839   CHOLHDL 1.8 03/23/2019 0839   CHOLHDL 2.7 02/21/2019 0425   VLDL 10 02/21/2019 0425   LDLCALC 68 03/23/2019 0839     Risk Assessment/Calculations:   {Does this patient have ATRIAL FIBRILLATION?:[id number]}   Physical Exam:    VS:  LMP 12/14/2017     Wt Readings from Last 3 Encounters:  08/19/21 112 lb  14 oz (51.2 kg)  07/29/21 117 lb 8.1 oz (53.3 kg)  03/26/21 126 lb 8.7 oz (57.4 kg)     GEN: *** Well nourished, well developed in no acute distress HEENT: Normal NECK: No JVD; No carotid bruits LYMPHATICS: No lymphadenopathy CARDIAC: ***RRR, no murmurs, rubs, gallops RESPIRATORY:  Clear to auscultation without rales, wheezing or rhonchi  ABDOMEN: Soft, non-tender, non-distended MUSCULOSKELETAL:  No edema; No deformity  SKIN: Warm and dry NEUROLOGIC:  Alert and oriented x 3 PSYCHIATRIC:  Normal affect   ASSESSMENT AND PLAN:    ***  2. ***  Medication Adjustments/Labs and Tests Ordered: Current medicines are reviewed at length with the patient today.  Concerns regarding medicines are outlined above.  No orders of the defined types were placed in this encounter.  No orders of the defined types were placed in this encounter.   There are no Patient Instructions on file for this  visit.   Jarrett Soho, [place]  12/06/2021 9:43 AM    [facility]

## 2021-12-31 ENCOUNTER — Emergency Department (HOSPITAL_COMMUNITY)
Admission: EM | Admit: 2021-12-31 | Discharge: 2021-12-31 | Disposition: A | Discharge status: Home / Self Care | Payer: Medicare HMO | Attending: Emergency Medicine | Admitting: Emergency Medicine

## 2021-12-31 ENCOUNTER — Encounter (HOSPITAL_COMMUNITY): Payer: Self-pay

## 2021-12-31 ENCOUNTER — Other Ambulatory Visit: Payer: Self-pay

## 2021-12-31 ENCOUNTER — Emergency Department (HOSPITAL_COMMUNITY): Payer: Medicare HMO

## 2021-12-31 DIAGNOSIS — J9601 Acute respiratory failure with hypoxia: Secondary | ICD-10-CM | POA: Diagnosis not present

## 2021-12-31 DIAGNOSIS — D696 Thrombocytopenia, unspecified: Secondary | ICD-10-CM | POA: Diagnosis not present

## 2021-12-31 DIAGNOSIS — Z992 Dependence on renal dialysis: Secondary | ICD-10-CM | POA: Diagnosis not present

## 2021-12-31 DIAGNOSIS — N186 End stage renal disease: Secondary | ICD-10-CM | POA: Diagnosis not present

## 2021-12-31 DIAGNOSIS — Z79899 Other long term (current) drug therapy: Secondary | ICD-10-CM | POA: Insufficient documentation

## 2021-12-31 DIAGNOSIS — D631 Anemia in chronic kidney disease: Secondary | ICD-10-CM | POA: Diagnosis not present

## 2021-12-31 DIAGNOSIS — R0602 Shortness of breath: Secondary | ICD-10-CM | POA: Diagnosis present

## 2021-12-31 DIAGNOSIS — I120 Hypertensive chronic kidney disease with stage 5 chronic kidney disease or end stage renal disease: Secondary | ICD-10-CM | POA: Insufficient documentation

## 2021-12-31 DIAGNOSIS — N189 Chronic kidney disease, unspecified: Secondary | ICD-10-CM

## 2021-12-31 LAB — CBC WITH DIFFERENTIAL/PLATELET
Abs Immature Granulocytes: 0.02 10*3/uL (ref 0.00–0.07)
Basophils Absolute: 0 10*3/uL (ref 0.0–0.1)
Basophils Relative: 1 %
Eosinophils Absolute: 0.2 10*3/uL (ref 0.0–0.5)
Eosinophils Relative: 3 %
HCT: 31.3 % — ABNORMAL LOW (ref 36.0–46.0)
Hemoglobin: 10.4 g/dL — ABNORMAL LOW (ref 12.0–15.0)
Immature Granulocytes: 0 %
Lymphocytes Relative: 11 %
Lymphs Abs: 0.7 10*3/uL (ref 0.7–4.0)
MCH: 34.3 pg — ABNORMAL HIGH (ref 26.0–34.0)
MCHC: 33.2 g/dL (ref 30.0–36.0)
MCV: 103.3 fL — ABNORMAL HIGH (ref 80.0–100.0)
Monocytes Absolute: 0.3 10*3/uL (ref 0.1–1.0)
Monocytes Relative: 5 %
Neutro Abs: 4.8 10*3/uL (ref 1.7–7.7)
Neutrophils Relative %: 80 %
Platelets: 104 10*3/uL — ABNORMAL LOW (ref 150–400)
RBC: 3.03 MIL/uL — ABNORMAL LOW (ref 3.87–5.11)
RDW: 14.5 % (ref 11.5–15.5)
WBC: 5.9 10*3/uL (ref 4.0–10.5)
nRBC: 0 % (ref 0.0–0.2)

## 2021-12-31 LAB — HEPATITIS B SURFACE ANTIGEN
Hepatitis B Surface Ag: NONREACTIVE
Hepatitis B Surface Ag: NONREACTIVE

## 2021-12-31 LAB — BASIC METABOLIC PANEL
Anion gap: 13 (ref 5–15)
BUN: 70 mg/dL — ABNORMAL HIGH (ref 6–20)
CO2: 27 mmol/L (ref 22–32)
Calcium: 10.3 mg/dL (ref 8.9–10.3)
Chloride: 97 mmol/L — ABNORMAL LOW (ref 98–111)
Creatinine, Ser: 9.7 mg/dL — ABNORMAL HIGH (ref 0.44–1.00)
GFR, Estimated: 5 mL/min — ABNORMAL LOW (ref >60)
Glucose, Bld: 127 mg/dL — ABNORMAL HIGH (ref 70–99)
Potassium: 3.7 mmol/L (ref 3.5–5.1)
Sodium: 137 mmol/L (ref 135–145)

## 2021-12-31 LAB — HEPATITIS B SURFACE ANTIBODY,QUALITATIVE: Hep B S Ab: REACTIVE — AB

## 2021-12-31 LAB — HEPATITIS B CORE ANTIBODY, TOTAL: Hep B Core Total Ab: REACTIVE — AB

## 2021-12-31 LAB — HEPATITIS C ANTIBODY: HCV Ab: NONREACTIVE

## 2021-12-31 MED ORDER — PENTAFLUOROPROP-TETRAFLUOROETH EX AERO
1.0000 | INHALATION_SPRAY | CUTANEOUS | Status: DC | PRN
  Filled 2021-12-31: qty 116

## 2021-12-31 MED ORDER — HEPARIN SODIUM (PORCINE) 1000 UNIT/ML DIALYSIS
1000.0000 [IU] | INTRAMUSCULAR | Status: DC | PRN
  Filled 2021-12-31: qty 1

## 2021-12-31 MED ORDER — LIDOCAINE-PRILOCAINE 2.5-2.5 % EX CREA
1.0000 | TOPICAL_CREAM | CUTANEOUS | Status: DC | PRN
  Filled 2021-12-31: qty 5

## 2021-12-31 MED ORDER — CHLORHEXIDINE GLUCONATE CLOTH 2 % EX PADS: 6.0000 | MEDICATED_PAD | Freq: Every day | CUTANEOUS | Status: DC

## 2021-12-31 NOTE — Progress Notes (Signed)
Patient ID: Madeline Brown, female   DOB: 1976-04-03, 46 y.o.   MRN: [id number]  Consulted by Dr. Roxanne Mins for management of volume overload in a patient who has ESRD on HD (TTS @ [facility]). She is on supplemental oxygen but not needing NIPPV. Felt unstable to DC to her OP HD unit.  I will order for hemodialysis here at the hospital with the plan to transfer [name] back to the ER after HD for re-evaluation to determine if she can be safely discharged home or meets additional criteria for admission.  HD unit informed and advised to dialyze her "First shift".  Elmarie Shiley MD [facility]. Office # [phone] Pager # [phone] 6:16 AM

## 2021-12-31 NOTE — Procedures (Signed)
I was present at this dialysis session. I have reviewed the session itself and made appropriate changes.  Patient here with concerns for volume overload.  Already feeling better halfway through dialysis.  Will return to the emergency department after dialysis to reassess respiratory status.  If improved she is stable for discharge from nephrology standpoint.  Filed Weights   12/31/21 0627 12/31/21 0800  Weight: 53.5 kg 55.3 kg    Recent Labs  Lab 12/31/21 0241  NA 137  K 3.7  CL 97*  CO2 27  GLUCOSE 127*  BUN 70*  CREATININE 9.70*  CALCIUM 10.3    Recent Labs  Lab 12/31/21 0241  WBC 5.9  NEUTROABS 4.8  HGB 10.4*  HCT 31.3*  MCV 103.3*  PLT 104*    Scheduled Meds:  Chlorhexidine Gluconate Cloth  6 each Topical Q0600   Continuous Infusions: PRN Meds:.heparin, lidocaine-prilocaine, pentafluoroprop-tetrafluoroeth   Santiago Bumpers,  MD 12/31/2021, 11:02 AM

## 2021-12-31 NOTE — ED Notes (Signed)
Pt ambulatory to the bathroom without difficulty.

## 2021-12-31 NOTE — Progress Notes (Addendum)
Received patient in bed, alert and oriented, oxygen 2 liters nasal cannula. Informed consent signed and in chart.  Time tx completed: 4 hours  HD treatment completed. Patient tolerated well. Stopped UF for 10 minutes at pt request d/t abdominal and leg cramping. Fistula/Graft without signs and symptoms of complications. Patient transported back to the room, alert and orient and in no acute distress. Report given to bedside RN. Pt transferred without oxygen at pt request commenting "I feel fine without it".   Total UF removed: 3.2 liters   Medication given: none  Post HD VS: BP 120/91 MAP 101 HR 100 RR 22 Temp 98.2 oral  Post HD weight: 51.6 kg

## 2021-12-31 NOTE — ED Triage Notes (Signed)
BIB [facility] from home c/o SOB. Room air o2 sat 80% placed pt on NRB 12lpm brought her up to 100%. Lung sounds are rales through out. Dialysis pt on Tues, Thurs, Sat. Fistula in lt arm restriction. Has been going to treatment. Has had this happen before and it was her needing an extra tx.

## 2021-12-31 NOTE — ED Notes (Signed)
Pt changed from NRB to [place] 4lpm at this time

## 2021-12-31 NOTE — ED Notes (Signed)
Decreased O2 to 2lpm via [place] at this time. Pt had no issue with [place] 4lpm prior to decreasing

## 2021-12-31 NOTE — ED Provider Notes (Signed)
[facility] EMERGENCY DEPARTMENT Provider Note   CSN: [id number] Arrival date & time: 12/31/21  0102     History  Chief Complaint  Patient presents with   Shortness of Breath    Madeline Brown is a 46 y.o. female.  The history is provided by the patient and the EMS personnel.  Shortness of Breath She has history of hypertension, end-stage renal disease on hemodialysis, seizure disorder and comes in because of shortness of breath which started at 12:30 AM.  She states when she would lay down to go to sleep, she noted shortness of breath.  She denies chest pain, heaviness, tightness, pressure.  Dyspnea was improved after EMS arrived and put her on oxygen.  EMS reported initial oxygen saturation of 80% which improved to 100% with nonrebreather mask.  Patient is scheduled for dialysis at 6 AM.  She does admit to having 4 pieces of pizza during the day today.   Home Medications Prior to Admission medications   Medication Sig Start Date End Date Taking? Authorizing Provider  acetaminophen (TYLENOL) 325 MG tablet Take 650 mg by mouth every 6 (six) hours as needed for mild pain or moderate pain (for dialysis treatment).    [provider]  amLODipine (NORVASC) 10 MG tablet Take 1 tablet (10 mg total) by mouth at bedtime. 12/09/19 01/29/22  Harvie Heck, MD  B Complex-C-Folic Acid (DIALYVITE PO) Take 1 tablet by mouth daily.    [provider]  carvedilol (COREG) 25 MG tablet Take 1 tablet (25 mg total) by mouth 2 (two) times daily with a meal. Patient taking differently: Take 25 mg by mouth 2 (two) times daily with a meal. Patient states they take on Tuesdays, Thursdays, and Saturdays at 1100 and 2000 Take on Sundays, Mondays, Wednesdays, and Fridays at 0630 and 1800 12/09/19 01/29/22  Harvie Heck, MD  hydrALAZINE (APRESOLINE) 100 MG tablet Take 1 tablet (100 mg total) by mouth 3 (three) times daily. 12/09/19 01/30/23  Harvie Heck, MD  levETIRAcetam (KEPPRA)  1000 MG tablet Take 1 tablet (1,000 mg total) by mouth 2 (two) times daily. Patient taking differently: Take 1,000 mg by mouth in the morning. 12/07/20   Penumalli, Earlean Polka, MD  sevelamer carbonate (RENVELA) 800 MG tablet Take 2 tablets (1,600 mg total) by mouth 3 (three) times daily with meals. 02/21/19   Madalyn Rob, MD  spironolactone (ALDACTONE) 50 MG tablet Take 1 tablet (50 mg total) by mouth daily. 12/09/19   Harvie Heck, MD      Allergies    Visine [tetrahydrozoline hcl]    Review of Systems   Review of Systems  Respiratory:  Positive for shortness of breath.   All other systems reviewed and are negative.   Physical Exam Updated Vital Signs BP (!) 164/103 (BP Location: Right Arm)   Pulse (!) 106   Temp 98.8 F (37.1 C) (Oral)   Resp (!) 28   LMP 12/14/2017   SpO2 98%  Physical Exam Vitals and nursing note reviewed.   46 year old female, resting comfortably and in no acute distress. Vital signs are significant for elevated heart rate, respiratory rate, blood pressure. Oxygen saturation is 98%, which is normal. Head is normocephalic and atraumatic. PERRLA, EOMI. Oropharynx is clear. Neck is nontender and supple without adenopathy.  JVD is present at 90 degrees. Back is nontender and there is no CVA tenderness.  There is trace presacral edema. Lungs are have bibasilar rales going about one third of the way up.  Chest is nontender. Heart has regular rate and rhythm without murmur. Abdomen is soft, flat, nontender without masses or hepatosplenomegaly and peristalsis is normoactive. Extremities have no cyanosis or edema, full range of motion is present.  AV fistula is present in the left upper arm with thrill present. Skin is warm and dry without rash. Neurologic: Mental status is normal, cranial nerves are intact, moves all extremities equally.  ED Results / Procedures / Treatments   Labs (all labs ordered are listed, but only abnormal results are displayed) Labs Reviewed   BASIC METABOLIC PANEL - Abnormal; Notable for the following components:      Result Value   Chloride 97 (*)    Glucose, Bld 127 (*)    BUN 70 (*)    Creatinine, Ser 9.70 (*)    GFR, Estimated 5 (*)    All other components within normal limits  CBC WITH DIFFERENTIAL/PLATELET - Abnormal; Notable for the following components:   RBC 3.03 (*)    Hemoglobin 10.4 (*)    HCT 31.3 (*)    MCV 103.3 (*)    MCH 34.3 (*)    Platelets 104 (*)    All other components within normal limits    EKG EKG Interpretation  Date/Time:  Tuesday December 31 2021 02:32:08 EDT Ventricular Rate:  105 PR Interval:  295 QRS Duration: 74 QT Interval:  325 QTC Calculation: 430 R Axis:   86 Text Interpretation: Sinus tachycardia Prolonged PR interval Left atrial enlargement Artifact in lead(s) I II aVR aVL aVF When compared with ECG of 08/17/2021, T wave abnormality has improved Confirmed by Delora Fuel ([id number]) on 12/31/2021 2:35:10 AM  Radiology DG Chest Port 1 View  Result Date: 12/31/2021 CLINICAL DATA:  Dyspnea EXAM: PORTABLE CHEST 1 VIEW COMPARISON:  08/18/2021 FINDINGS: Multifocal pulmonary infiltrates have developed within the mid and lower lung zones bilaterally, more focal at the right lung base, in keeping with acute infection or aspiration. Small left pleural effusion is suspected. No pneumothorax. Cardiac size is mildly enlarged, unchanged. Pulmonary vascularity is normal. No acute bone abnormality. IMPRESSION: Interval development of multifocal pulmonary infiltrates, in keeping with acute infection or aspiration. Electronically Signed   By: Fidela Salisbury M.D.   On: 12/31/2021 03:31    Procedures Procedures  Cardiac monitor shows sinus tachycardia, per my interpretation.  Medications Ordered in ED Medications - No data to display  ED Course/ Medical Decision Making/ A&P                           Medical Decision Making Amount and/or Complexity of Data Reviewed Labs: ordered. Radiology:  ordered.   Shortness of breath which seems to be pulmonary edema from fluid overload based on exam.  Pulmonary embolism, pneumonia, pleural effusion, pneumothorax.  I have reviewed and interpreted the ECG, and my interpretation is sinus tachycardia with first-degree AV block and left atrial enlargement.  When compared with prior ECG, prior T wave abnormality has resolved.  I have ordered laboratory tests of CBC and basic metabolic panel to rule out hyperkalemia.  I have ordered a chest x-ray.  Old records are reviewed, and she had been admitted on 08/17/2021 for acute hypoxic respiratory failure due to fluid overload related to dietary noncompliance and needed emergent dialysis.  X-ray shows multifocal pulmonary infiltrates which radiologist feels represents infection or aspiration.  I have independently viewed the images and agree that there are multifocal infiltrates, but feel the most likely cause is  asymmetric pulmonary edema.  I have reviewed and interpreted the laboratory tests and my interpretation is elevated BUN and creatinine secondary to known renal failure, normal potassium, mildly elevated glucose, mild anemia which is improved over baseline, moderate thrombocytopenia which is improved over recent values.  Case is discussed with Dr. Posey Pronto, on-call for nephrology, who will arrange for urgent dialysis.  Final Clinical Impression(s) / ED Diagnoses Final diagnoses:  Acute hypoxemic respiratory failure ([place])  End-stage renal disease on hemodialysis ([place])  Anemia associated with chronic renal failure  Thrombocytopenia [facility])    Rx / DC Orders ED Discharge Orders     None         Delora Fuel, MD 25/52/58 [phone]

## 2021-12-31 NOTE — Progress Notes (Addendum)
Per chart, pt receives out-pt HD at [facility] on [address]. Contacted clinic and awaiting response. Will assist as needed.   Melven Sartorius Renal Navigator [phone]  Addendum at 11:47 am: Pt arrives at 6:20 am for 6:40 am chair time.  Addendum at 3:22 pm: Pt d/c to home today. Contacted [facility] to advise clinic of pt's d/c and that pt should resume care on Thursday.

## 2022-01-01 ENCOUNTER — Ambulatory Visit (INDEPENDENT_AMBULATORY_CARE_PROVIDER_SITE_OTHER): Payer: Medicare HMO | Admitting: Podiatry

## 2022-01-01 DIAGNOSIS — Z91199 Patient's noncompliance with other medical treatment and regimen due to unspecified reason: Secondary | ICD-10-CM

## 2022-01-01 LAB — HEPATITIS B SURFACE ANTIBODY, QUANTITATIVE
Hep B S AB Quant (Post): 280.1 m[IU]/mL (ref >9.9)
Hep B S AB Quant (Post): 297.3 m[IU]/mL (ref >9.9)

## 2022-01-01 LAB — HEPATITIS B E ANTIBODY: Hep B E Ab: POSITIVE — AB

## 2022-01-01 NOTE — Progress Notes (Signed)
No show

## 2022-01-02 LAB — HEPATITIS B CORE ANTIBODY, IGM: Hep B C IgM: UNDETERMINED — AB

## 2022-01-20 ENCOUNTER — Ambulatory Visit: Payer: Medicare HMO | Admitting: Physician Assistant

## 2022-01-26 NOTE — Progress Notes (Deleted)
Office Visit    Patient Name: Madeline Brown Date of Encounter: 01/26/2022  Primary Care Provider:  Sonia Side., FNP Primary Cardiologist:  Jenkins Rouge, MD Primary Electrophysiologist: None  Chief Complaint    Madeline Brown is a 46 y.o. female with PMH of HTN, CVA, ESRD on HD, anemia, tobacco abuse, pulmonary HTN who presents today for evaluation of hypertension.  Past Medical History    Past Medical History:  Diagnosis Date   Anemia    ESRD on HD    Headache, unspecified 09/26/2017   History of blood transfusion 07/2020   Hypertension    Intracranial hemorrhage (HCC)    LVH (left ventricular hypertrophy)    Pericardial effusion    Pulmonary hypertension ([place])    Seizure ([place]) 02/2019   Shortness of breath 07/15/2018   01/16/21 not currently   [facility]roke [facility])    Mild was told after having a seizure   Thrombocytopenia ([place])    Past Surgical History:  Procedure Laterality Date   AV FISTULA PLACEMENT Left 06/24/2017   Procedure: CREATION OF LEFT ARM BRACHIALCEPHALIC  ARTERIOVENOUS (AV) FISTULA;  Surgeon: Rosetta Posner, MD;  Location: [place];  Service: Vascular;  Laterality: Left;   CESAREAN SECTION     x2   COMPLEX WOUND CLOSURE Left 07/30/2020   Procedure: COMPLEX WOUND CLOSURE;  Surgeon: Cherre Robins, MD;  Location: [place];  Service: Vascular;  Laterality: Left;   FISTULA SUPERFICIALIZATION Left 10/28/2017   Procedure: SUPERFICIALIZATION LEFT BRACHIOCEPHALIC ARTERIOVENOUS FISTULA;  Surgeon: Rosetta Posner, MD;  Location: [place];  Service: Vascular;  Laterality: Left;   INGUINAL HERNIA REPAIR Bilateral 02/01/2021   Procedure: LAPAROSCOPIC BILATERAL INGUINAL HERNIA REPAIR WITH MESH;  Surgeon: Kinsinger, Arta Bruce, MD;  Location: [facility];  Service: General;  Laterality: Bilateral;   INSERTION OF DIALYSIS CATHETER Right 06/24/2017   Procedure: INSERTION OF TUNNELED DIALYSIS CATHETER;  Surgeon: Rosetta Posner, MD;  Location: [place];  Service: Vascular;  Laterality: Right;    INSERTION OF DIALYSIS CATHETER Right 07/30/2020   Procedure: INSERTION OF DIALYSIS CATHETER;  Surgeon: Cherre Robins, MD;  Location: [place];  Service: Vascular;  Laterality: Right;   LIGATION OF COMPETING BRANCHES OF ARTERIOVENOUS FISTULA  10/28/2017   Procedure: LIGATION OF COMPETING BRANCHES OF ARTERIOVENOUS FISTULA x3;  Surgeon: Rosetta Posner, MD;  Location: [place];  Service: Vascular;;   REVISON OF ARTERIOVENOUS FISTULA Left 07/30/2020   Procedure: LEFT UPPER EXTREMITY ARTERIOVENOUS FISTULA REVISON;  Surgeon: Cherre Robins, MD;  Location: [facility];  Service: Vascular;  Laterality: Left;  PERIPHERAL NERVE BLOCK    Allergies  Allergies  Allergen Reactions   Visine [Tetrahydrozoline Hcl] Swelling    Eyes Swelling    History of Present Illness    Madeline Brown is a 46 year old female with the above-mentioned past medical history who presents today for posthospital follow-up.  She was initially seen in 2018 for abnormal troponins and chest pain during hospitalization for AKI.  She did describe some radiating epigastric pain that was alleviated with sitting and supine position.  She had mild dyspnea that lasted for a few moments.  EKG was noted to be sinus tach with no acute changes.  She was noted to be hypertensive and patient was transferred to [facility] for further management.  2D echo was completed showing small to moderate pericardial effusion with LVH noted, EF of 50-55%.  She was evaluated by cardiology who felt that effusion was caused by renal failure.  She  was seen in follow-up and 2D echo was repeated withEF 60-65% severe LVH,Estimated PA pressure 54 mmHg small to moderate circumferential effusion no tamponade.  She was hospitalized on 11/2019 with HTN emergency ICH and seizures BP meds adjusted and started on Keppra She was non compliant with her meds and presented with pulmonary edema.  She was dialyzed and hydralazine was increased to 100 mg. Repeat MRI without significant interval change,  no acute hemorrhage and patient was advised to continue Keppra.  She was admitted 08/06/2020 following revision to fistula.  She was found to have volume overload and low-grade fever with tachycardia.  She was dialyzed and treated for acute respiratory failure with hypoxia.  She was then seen on 03/2021 in the ED for dyspnea and chest pain.  She was found to be fluid overloaded and patient improved with dialysis and discharged.  2D echo was completed with EF of 60 to 65%, LVH, and mild elevated PA systolic pressure at 09.8 mmHg, mildly dilated RA and mildly dilated LA with no valvular abnormalities.  She was most recently seen in the ED on 12/2021 for shortness of breath that occurs with lying flat.  She presented via EMS with sats of 80%.  Patient did admit to sodium indiscretion with pizza earlier that day.  ECG with sinus tach and first-degree AV block with LAE.  Since last being seen in the office patient reports***.  Patient denies chest pain, palpitations, dyspnea, PND, orthopnea, nausea, vomiting, dizziness, syncope, edema, weight gain, or early satiety.     ***Notes: She was last seen January 2022 -Last 2D echo was 03/2021  -Last ischemic evaluation was ESRD Home Medications    Current Outpatient Medications  Medication Sig Dispense Refill   acetaminophen (TYLENOL) 325 MG tablet Take 650 mg by mouth every 6 (six) hours as needed for mild pain or moderate pain (for dialysis treatment).     amLODipine (NORVASC) 10 MG tablet Take 1 tablet (10 mg total) by mouth at bedtime. 30 tablet 0   B Complex-C-Folic Acid (DIALYVITE PO) Take 1 tablet by mouth daily.     carvedilol (COREG) 25 MG tablet Take 1 tablet (25 mg total) by mouth 2 (two) times daily with a meal. (Patient taking differently: Take 25 mg by mouth 2 (two) times daily with a meal. Patient states they take on Tuesdays, Thursdays, and Saturdays at 1100 and 2000 Take on Sundays, Mondays, Wednesdays, and Fridays at 0630 and 1800) 60 tablet 0    hydrALAZINE (APRESOLINE) 100 MG tablet Take 1 tablet (100 mg total) by mouth 3 (three) times daily. 90 tablet 0   levETIRAcetam (KEPPRA) 1000 MG tablet Take 1 tablet (1,000 mg total) by mouth 2 (two) times daily. (Patient taking differently: Take 1,000 mg by mouth in the morning.) 60 tablet 1   sevelamer carbonate (RENVELA) 800 MG tablet Take 2 tablets (1,600 mg total) by mouth 3 (three) times daily with meals. 30 tablet 0   spironolactone (ALDACTONE) 50 MG tablet Take 1 tablet (50 mg total) by mouth daily. 30 tablet 0   No current facility-administered medications for this visit.     Review of Systems  Please see the history of present illness.    (+)*** (+)***  All other systems reviewed and are otherwise negative except as noted above.  Physical Exam    Wt Readings from Last 3 Encounters:  12/31/21 113 lb 12.1 oz (51.6 kg)  08/19/21 112 lb 14 oz (51.2 kg)  07/29/21 117 lb 8.1 oz (53.3  kg)   FX:TKWIO were no vitals filed for this visit.,There is no height or weight on file to calculate BMI.  Constitutional:      Appearance: Healthy appearance. Not in distress.  Neck:     Vascular: JVD normal.  Pulmonary:     Effort: Pulmonary effort is normal.     Breath sounds: No wheezing. No rales. Diminished in the bases Cardiovascular:     Normal rate. Regular rhythm. Normal S1. Normal S2.      Murmurs: There is no murmur.  Edema:    Peripheral edema absent.  Abdominal:     Palpations: Abdomen is soft non tender. There is no hepatomegaly.  Skin:    General: Skin is warm and dry.  Neurological:     General: No focal deficit present.     Mental Status: Alert and oriented to person, place and time.     Cranial Nerves: Cranial nerves are intact.  EKG/LABS/Other Studies Reviewed    ECG personally reviewed by me today - ***  Risk Assessment/Calculations:   {Does this patient have ATRIAL FIBRILLATION?[phone]}        Lab Results  Component Value Date   WBC 5.9 12/31/2021    HGB 10.4 (L) 12/31/2021   HCT 31.3 (L) 12/31/2021   MCV 103.3 (H) 12/31/2021   PLT 104 (L) 12/31/2021   Lab Results  Component Value Date   CREATININE 9.70 (H) 12/31/2021   BUN 70 (H) 12/31/2021   NA 137 12/31/2021   K 3.7 12/31/2021   CL 97 (L) 12/31/2021   CO2 27 12/31/2021   Lab Results  Component Value Date   ALT 11 07/28/2021   AST 15 07/28/2021   ALKPHOS 39 07/28/2021   BILITOT 1.8 (H) 07/28/2021   Lab Results  Component Value Date   CHOL 172 03/23/2019   HDL 93 03/23/2019   LDLCALC 68 03/23/2019   TRIG 55 03/23/2019   CHOLHDL 1.8 03/23/2019    Lab Results  Component Value Date   HGBA1C 4.3 (L) 02/21/2019    Assessment & Plan    1.  Pulmonary HTN  2.  HTN  3.  ESRD  4.  History of ICH      Disposition: Follow-up with Jenkins Rouge, MD or APP in *** months {Are you ordering a CV Procedure (e.g. stress test, cath, DCCV, TEE, etc)?   Press F2        :[id number]}   Medication Adjustments/Labs and Tests Ordered: Current medicines are reviewed at length with the patient today.  Concerns regarding medicines are outlined above.   Signed, Mable Fill, Marissa Nestle, NP 01/26/2022, 2:34 PM [place]

## 2022-01-29 ENCOUNTER — Ambulatory Visit: Payer: Medicare HMO | Admitting: Nurse Practitioner

## 2022-01-31 NOTE — Progress Notes (Deleted)
Office Visit    Patient Name: Madeline Brown Date of Encounter: 01/31/2022  Primary Care Provider:  Sonia Side., FNP Primary Cardiologist:  Jenkins Rouge, MD Primary Electrophysiologist: None  Chief Complaint    Madeline Brown is a 46 y.o. female with PMH of HTN, CVA, ESRD on HD, anemia, tobacco abuse, pulmonary HTN, COPD, who presents today for evaluation of hypertension.  Past Medical History    Past Medical History:  Diagnosis Date   Anemia    ESRD on HD    Headache, unspecified 09/26/2017   History of blood transfusion 07/2020   Hypertension    Intracranial hemorrhage (HCC)    LVH (left ventricular hypertrophy)    Pericardial effusion    Pulmonary hypertension ([place])    Seizure ([place]) 02/2019   Shortness of breath 07/15/2018   01/16/21 not currently   Stroke [facility])    Mild was told after having a seizure   Thrombocytopenia ([place])    Past Surgical History:  Procedure Laterality Date   AV FISTULA PLACEMENT Left 06/24/2017   Procedure: CREATION OF LEFT ARM BRACHIALCEPHALIC  ARTERIOVENOUS (AV) FISTULA;  Surgeon: Rosetta Posner, MD;  Location: [place];  Service: Vascular;  Laterality: Left;   CESAREAN SECTION     x2   COMPLEX WOUND CLOSURE Left 07/30/2020   Procedure: COMPLEX WOUND CLOSURE;  Surgeon: Cherre Robins, MD;  Location: [place];  Service: Vascular;  Laterality: Left;   FISTULA SUPERFICIALIZATION Left 10/28/2017   Procedure: SUPERFICIALIZATION LEFT BRACHIOCEPHALIC ARTERIOVENOUS FISTULA;  Surgeon: Rosetta Posner, MD;  Location: [place];  Service: Vascular;  Laterality: Left;   INGUINAL HERNIA REPAIR Bilateral 02/01/2021   Procedure: LAPAROSCOPIC BILATERAL INGUINAL HERNIA REPAIR WITH MESH;  Surgeon: Kinsinger, Arta Bruce, MD;  [facility];  Service: General;  Laterality: Bilateral;   INSERTION OF DIALYSIS CATHETER Right 06/24/2017   Procedure: INSERTION OF TUNNELED DIALYSIS CATHETER;  Surgeon: Rosetta Posner, MD;  Location: [place];  Service: Vascular;  Laterality:  Right;   INSERTION OF DIALYSIS CATHETER Right 07/30/2020   Procedure: INSERTION OF DIALYSIS CATHETER;  Surgeon: Cherre Robins, MD;  Location: [place];  Service: Vascular;  Laterality: Right;   LIGATION OF COMPETING BRANCHES OF ARTERIOVENOUS FISTULA  10/28/2017   Procedure: LIGATION OF COMPETING BRANCHES OF ARTERIOVENOUS FISTULA x3;  Surgeon: Rosetta Posner, MD;  Location: [place];  Service: Vascular;;   REVISON OF ARTERIOVENOUS FISTULA Left 07/30/2020   Procedure: LEFT UPPER EXTREMITY ARTERIOVENOUS FISTULA REVISON;  Surgeon: Cherre Robins, MD;  [facility];  Service: Vascular;  Laterality: Left;  PERIPHERAL NERVE BLOCK    Allergies  Allergies  Allergen Reactions   Visine [Tetrahydrozoline Hcl] Swelling    Eyes Swelling    History of Present Illness    Madeline Brown is a 46 year old female with the above-mentioned past medical history who presents today for posthospital follow-up.  She was initially seen in 2018 for abnormal troponins and chest pain during hospitalization for AKI.  She did describe some radiating epigastric pain that was alleviated with sitting and supine position.  She had mild dyspnea that lasted for a few moments.  EKG was noted to be sinus tach with no acute changes.  She was noted to be hypertensive and patient was transferred to [facility] for further management.  2D echo was completed showing small to moderate pericardial effusion with LVH noted, EF of 50-55%.  She was evaluated by cardiology who felt that effusion was caused by renal failure.  She was seen in follow-up and 2D echo was repeated withEF 60-65% severe LVH,Estimated PA pressure 54 mmHg small to moderate circumferential effusion no tamponade.   She was hospitalized on 11/2019 with HTN emergency ICH and seizures BP meds adjusted and started on Keppra She was non compliant with her meds and presented with pulmonary edema.  She was dialyzed and hydralazine was increased to 100 mg. Repeat MRI without significant  interval change, no acute hemorrhage and patient was advised to continue Keppra.  She was admitted 08/06/2020 following revision to fistula.  She was found to have volume overload and low-grade fever with tachycardia.  She was dialyzed and treated for acute respiratory failure with hypoxia.  She was then seen on 03/2021 in the ED for dyspnea and chest pain.  She was found to be fluid overloaded and patient improved with dialysis and discharged.  2D echo was completed with EF of 60 to 65%, LVH, and mild elevated PA systolic pressure at 46.5 mmHg, mildly dilated RA and mildly dilated LA with no valvular abnormalities.  She was most recently seen in the ED on 12/2021 for shortness of breath that occurs with lying flat.  She presented via EMS with sats of 80%.  Patient did admit to sodium indiscretion with pizza earlier that day.  ECG with sinus tach and first-degree AV block with LAE.  She was admitted on 01/2022 with continued shortness of breath and was found to have exacerbation of COPD treated with antibiotics.  Patient had missed HD on Thursday and was dialyzed on 8/20.   Since last being seen in the office patient reports***.  Patient denies chest pain, palpitations, dyspnea, PND, orthopnea, nausea, vomiting, dizziness, syncope, edema, weight gain, or early satiety.         ***Notes: She was last seen January 2022 -Last 2D echo was 01/2022 with no significant change from previous echo -Recent admission for shortness of breath Home Medications    Current Outpatient Medications  Medication Sig Dispense Refill   acetaminophen (TYLENOL) 325 MG tablet Take 650 mg by mouth every 6 (six) hours as needed for mild pain or moderate pain (for dialysis treatment).     amLODipine (NORVASC) 10 MG tablet Take 1 tablet (10 mg total) by mouth at bedtime. 30 tablet 0   B Complex-C-Folic Acid (DIALYVITE PO) Take 1 tablet by mouth daily.     carvedilol (COREG) 25 MG tablet Take 1 tablet (25 mg total) by mouth 2 (two)  times daily with a meal. (Patient taking differently: Take 25 mg by mouth 2 (two) times daily with a meal. Patient states they take on Tuesdays, Thursdays, and Saturdays at 1100 and 2000 Take on Sundays, Mondays, Wednesdays, and Fridays at 0630 and 1800) 60 tablet 0   hydrALAZINE (APRESOLINE) 100 MG tablet Take 1 tablet (100 mg total) by mouth 3 (three) times daily. 90 tablet 0   levETIRAcetam (KEPPRA) 1000 MG tablet Take 1 tablet (1,000 mg total) by mouth 2 (two) times daily. (Patient taking differently: Take 1,000 mg by mouth in the morning.) 60 tablet 1   sevelamer carbonate (RENVELA) 800 MG tablet Take 2 tablets (1,600 mg total) by mouth 3 (three) times daily with meals. 30 tablet 0   spironolactone (ALDACTONE) 50 MG tablet Take 1 tablet (50 mg total) by mouth daily. 30 tablet 0   No current facility-administered medications for this visit.     Review of Systems  Please see the history of present illness.    (+)*** (+)***  All other systems reviewed and are otherwise negative except as noted above.  Physical Exam    Wt Readings from Last 3 Encounters:  12/31/21 113 lb 12.1 oz (51.6 kg)  08/19/21 112 lb 14 oz (51.2 kg)  07/29/21 117 lb 8.1 oz (53.3 kg)   KG:YJEHU were no vitals filed for this visit.,There is no height or weight on file to calculate BMI.  Constitutional:      Appearance: Healthy appearance. Not in distress.  Neck:     Vascular: JVD normal.  Pulmonary:     Effort: Pulmonary effort is normal.     Breath sounds: No wheezing. No rales. Diminished in the bases Cardiovascular:     Normal rate. Regular rhythm. Normal S1. Normal S2.      Murmurs: There is no murmur.  Edema:    Peripheral edema absent.  Abdominal:     Palpations: Abdomen is soft non tender. There is no hepatomegaly.  Skin:    General: Skin is warm and dry.  Neurological:     General: No focal deficit present.     Mental Status: Alert and oriented to person, place and time.     Cranial Nerves:  Cranial nerves are intact.  EKG/LABS/Other Studies Reviewed    ECG personally reviewed by me today - ***  Risk Assessment/Calculations:   {Does this patient have ATRIAL FIBRILLATION?:[phone]}        Lab Results  Component Value Date   WBC 5.9 12/31/2021   HGB 10.4 (L) 12/31/2021   HCT 31.3 (L) 12/31/2021   MCV 103.3 (H) 12/31/2021   PLT 104 (L) 12/31/2021   Lab Results  Component Value Date   CREATININE 9.70 (H) 12/31/2021   BUN 70 (H) 12/31/2021   NA 137 12/31/2021   K 3.7 12/31/2021   CL 97 (L) 12/31/2021   CO2 27 12/31/2021   Lab Results  Component Value Date   ALT 11 07/28/2021   AST 15 07/28/2021   ALKPHOS 39 07/28/2021   BILITOT 1.8 (H) 07/28/2021   Lab Results  Component Value Date   CHOL 172 03/23/2019   HDL 93 03/23/2019   LDLCALC 68 03/23/2019   TRIG 55 03/23/2019   CHOLHDL 1.8 03/23/2019    Lab Results  Component Value Date   HGBA1C 4.3 (L) 02/21/2019    Assessment & Plan    1.  Pulmonary HTN:   2.  HTN:   3.  ESRD:   4.  History of ICH:  5.  Tobacco abuse:        Disposition: Follow-up with Jenkins Rouge, MD or APP in *** months {Are you ordering a CV Procedure (e.g. stress test, cath, DCCV, TEE, etc)?   Press F2        :[id number]}   Medication Adjustments/Labs and Tests Ordered: Current medicines are reviewed at length with the patient today.  Concerns regarding medicines are outlined above.   Signed, Mable Fill, Marissa Nestle, NP 01/31/2022, 1:41 PM [place]

## 2022-02-02 ENCOUNTER — Encounter (HOSPITAL_COMMUNITY): Payer: Self-pay

## 2022-02-02 ENCOUNTER — Inpatient Hospital Stay (HOSPITAL_COMMUNITY)
Admission: EM | Admit: 2022-02-02 | Discharge: 2022-02-04 | DRG: 193 | Disposition: A | Discharge status: Home / Self Care | Payer: Medicare HMO | Attending: Family Medicine | Admitting: Family Medicine

## 2022-02-02 ENCOUNTER — Emergency Department (HOSPITAL_COMMUNITY): Payer: Medicare HMO

## 2022-02-02 ENCOUNTER — Other Ambulatory Visit: Payer: Self-pay

## 2022-02-02 DIAGNOSIS — Z8249 Family history of ischemic heart disease and other diseases of the circulatory system: Secondary | ICD-10-CM | POA: Diagnosis not present

## 2022-02-02 DIAGNOSIS — F17210 Nicotine dependence, cigarettes, uncomplicated: Secondary | ICD-10-CM | POA: Diagnosis present

## 2022-02-02 DIAGNOSIS — Z9981 Dependence on supplemental oxygen: Secondary | ICD-10-CM

## 2022-02-02 DIAGNOSIS — J189 Pneumonia, unspecified organism: Principal | ICD-10-CM

## 2022-02-02 DIAGNOSIS — Z820 Family history of epilepsy and other diseases of the nervous system: Secondary | ICD-10-CM | POA: Diagnosis not present

## 2022-02-02 DIAGNOSIS — J9621 Acute and chronic respiratory failure with hypoxia: Secondary | ICD-10-CM | POA: Diagnosis present

## 2022-02-02 DIAGNOSIS — Z780 Asymptomatic menopausal state: Secondary | ICD-10-CM | POA: Diagnosis not present

## 2022-02-02 DIAGNOSIS — G40909 Epilepsy, unspecified, not intractable, without status epilepticus: Secondary | ICD-10-CM | POA: Diagnosis present

## 2022-02-02 DIAGNOSIS — I10 Essential (primary) hypertension: Secondary | ICD-10-CM | POA: Diagnosis not present

## 2022-02-02 DIAGNOSIS — I132 Hypertensive heart and chronic kidney disease with heart failure and with stage 5 chronic kidney disease, or end stage renal disease: Secondary | ICD-10-CM | POA: Diagnosis present

## 2022-02-02 DIAGNOSIS — Z833 Family history of diabetes mellitus: Secondary | ICD-10-CM

## 2022-02-02 DIAGNOSIS — M898X9 Other specified disorders of bone, unspecified site: Secondary | ICD-10-CM | POA: Diagnosis present

## 2022-02-02 DIAGNOSIS — D638 Anemia in other chronic diseases classified elsewhere: Secondary | ICD-10-CM

## 2022-02-02 DIAGNOSIS — E871 Hypo-osmolality and hyponatremia: Secondary | ICD-10-CM | POA: Diagnosis not present

## 2022-02-02 DIAGNOSIS — I5033 Acute on chronic diastolic (congestive) heart failure: Secondary | ICD-10-CM | POA: Diagnosis present

## 2022-02-02 DIAGNOSIS — R0602 Shortness of breath: Principal | ICD-10-CM

## 2022-02-02 DIAGNOSIS — J9601 Acute respiratory failure with hypoxia: Secondary | ICD-10-CM | POA: Diagnosis not present

## 2022-02-02 DIAGNOSIS — Z992 Dependence on renal dialysis: Secondary | ICD-10-CM | POA: Diagnosis present

## 2022-02-02 DIAGNOSIS — N186 End stage renal disease: Secondary | ICD-10-CM | POA: Diagnosis present

## 2022-02-02 DIAGNOSIS — Z91158 Patient's noncompliance with renal dialysis for other reason: Secondary | ICD-10-CM | POA: Diagnosis not present

## 2022-02-02 DIAGNOSIS — Z8673 Personal history of transient ischemic attack (TIA), and cerebral infarction without residual deficits: Secondary | ICD-10-CM | POA: Diagnosis not present

## 2022-02-02 DIAGNOSIS — D631 Anemia in chronic kidney disease: Secondary | ICD-10-CM

## 2022-02-02 DIAGNOSIS — Z79899 Other long term (current) drug therapy: Secondary | ICD-10-CM

## 2022-02-02 DIAGNOSIS — Z20822 Contact with and (suspected) exposure to covid-19: Secondary | ICD-10-CM | POA: Diagnosis present

## 2022-02-02 DIAGNOSIS — R0609 Other forms of dyspnea: Secondary | ICD-10-CM | POA: Diagnosis not present

## 2022-02-02 DIAGNOSIS — I272 Pulmonary hypertension, unspecified: Secondary | ICD-10-CM | POA: Diagnosis present

## 2022-02-02 LAB — COMPREHENSIVE METABOLIC PANEL
ALT: 10 U/L (ref 0–44)
AST: 15 U/L (ref 15–41)
Albumin: 3.4 g/dL — ABNORMAL LOW (ref 3.5–5.0)
Alkaline Phosphatase: 35 U/L — ABNORMAL LOW (ref 38–126)
Anion gap: 13 (ref 5–15)
BUN: 46 mg/dL — ABNORMAL HIGH (ref 6–20)
CO2: 32 mmol/L (ref 22–32)
Calcium: 9.6 mg/dL (ref 8.9–10.3)
Chloride: 92 mmol/L — ABNORMAL LOW (ref 98–111)
Creatinine, Ser: 7.87 mg/dL — ABNORMAL HIGH (ref 0.44–1.00)
GFR, Estimated: 6 mL/min — ABNORMAL LOW (ref >60)
Glucose, Bld: 102 mg/dL — ABNORMAL HIGH (ref 70–99)
Potassium: 3.5 mmol/L (ref 3.5–5.1)
Sodium: 137 mmol/L (ref 135–145)
Total Bilirubin: 1.3 mg/dL — ABNORMAL HIGH (ref 0.3–1.2)
Total Protein: 7 g/dL (ref 6.5–8.1)

## 2022-02-02 LAB — CBC WITH DIFFERENTIAL/PLATELET
Abs Immature Granulocytes: 0.02 10*3/uL (ref 0.00–0.07)
Basophils Absolute: 0 10*3/uL (ref 0.0–0.1)
Basophils Relative: 1 %
Eosinophils Absolute: 0.1 10*3/uL (ref 0.0–0.5)
Eosinophils Relative: 2 %
HCT: 28.4 % — ABNORMAL LOW (ref 36.0–46.0)
Hemoglobin: 9.1 g/dL — ABNORMAL LOW (ref 12.0–15.0)
Immature Granulocytes: 1 %
Lymphocytes Relative: 14 %
Lymphs Abs: 0.6 10*3/uL — ABNORMAL LOW (ref 0.7–4.0)
MCH: 32.9 pg (ref 26.0–34.0)
MCHC: 32 g/dL (ref 30.0–36.0)
MCV: 102.5 fL — ABNORMAL HIGH (ref 80.0–100.0)
Monocytes Absolute: 0.3 10*3/uL (ref 0.1–1.0)
Monocytes Relative: 6 %
Neutro Abs: 3.4 10*3/uL (ref 1.7–7.7)
Neutrophils Relative %: 76 %
Platelets: 151 10*3/uL (ref 150–400)
RBC: 2.77 MIL/uL — ABNORMAL LOW (ref 3.87–5.11)
RDW: 15.7 % — ABNORMAL HIGH (ref 11.5–15.5)
WBC: 4.4 10*3/uL (ref 4.0–10.5)
nRBC: 0.5 % — ABNORMAL HIGH (ref 0.0–0.2)

## 2022-02-02 LAB — HEPATITIS C ANTIBODY: HCV Ab: NONREACTIVE

## 2022-02-02 LAB — RESP PANEL BY RT-PCR (FLU A&B, COVID) ARPGX2
Influenza A by PCR: NEGATIVE
Influenza B by PCR: NEGATIVE
SARS Coronavirus 2 by RT PCR: NEGATIVE

## 2022-02-02 LAB — BRAIN NATRIURETIC PEPTIDE: B Natriuretic Peptide: 344.6 pg/mL — ABNORMAL HIGH (ref 0.0–100.0)

## 2022-02-02 LAB — TROPONIN I (HIGH SENSITIVITY)
Troponin I (High Sensitivity): 19 ng/L — ABNORMAL HIGH (ref <18)
Troponin I (High Sensitivity): 25 ng/L — ABNORMAL HIGH (ref <18)

## 2022-02-02 LAB — HEPATITIS B SURFACE ANTIBODY,QUALITATIVE: Hep B S Ab: REACTIVE — AB

## 2022-02-02 LAB — HEPATITIS B CORE ANTIBODY, TOTAL: Hep B Core Total Ab: REACTIVE — AB

## 2022-02-02 LAB — HEPATITIS B SURFACE ANTIGEN: Hepatitis B Surface Ag: NONREACTIVE

## 2022-02-02 LAB — I-STAT BETA HCG BLOOD, ED (MC, WL, AP ONLY): I-stat hCG, quantitative: <5 m[IU]/mL (ref <5)

## 2022-02-02 MED ORDER — SEVELAMER CARBONATE 800 MG PO TABS
1600.0000 mg | ORAL_TABLET | Freq: Three times a day (TID) | ORAL | Status: DC
  Administered 2022-02-02 – 2022-02-04 (×4): 1600 mg via ORAL
  Filled 2022-02-02 (×5): qty 2

## 2022-02-02 MED ORDER — SPIRONOLACTONE 25 MG PO TABS
50.0000 mg | ORAL_TABLET | Freq: Every day | ORAL | Status: DC
  Administered 2022-02-03: 50 mg via ORAL
  Filled 2022-02-02: qty 2

## 2022-02-02 MED ORDER — AMLODIPINE BESYLATE 10 MG PO TABS
10.0000 mg | ORAL_TABLET | Freq: Every day | ORAL | Status: DC
  Administered 2022-02-02 – 2022-02-03 (×2): 10 mg via ORAL
  Filled 2022-02-02: qty 2
  Filled 2022-02-02: qty 1
  Filled 2022-02-02: qty 2
  Filled 2022-02-02: qty 1

## 2022-02-02 MED ORDER — IOHEXOL 350 MG/ML SOLN
70.0000 mL | Freq: Once | INTRAVENOUS | Status: AC | PRN
  Administered 2022-02-02: 70 mL via INTRAVENOUS

## 2022-02-02 MED ORDER — LEVETIRACETAM 500 MG PO TABS
1000.0000 mg | ORAL_TABLET | Freq: Every day | ORAL | Status: DC
  Administered 2022-02-03: 1000 mg via ORAL
  Filled 2022-02-02 (×2): qty 2

## 2022-02-02 MED ORDER — HYDRALAZINE HCL 50 MG PO TABS
100.0000 mg | ORAL_TABLET | Freq: Three times a day (TID) | ORAL | Status: DC
  Administered 2022-02-02 – 2022-02-03 (×4): 100 mg via ORAL
  Filled 2022-02-02 (×5): qty 2

## 2022-02-02 MED ORDER — AZITHROMYCIN 500 MG PO TABS
250.0000 mg | ORAL_TABLET | Freq: Every day | ORAL | Status: DC
  Administered 2022-02-03: 250 mg via ORAL
  Filled 2022-02-02: qty 1

## 2022-02-02 MED ORDER — RENA-VITE PO TABS
1.0000 | ORAL_TABLET | Freq: Every day | ORAL | Status: DC
  Administered 2022-02-03: 1 via ORAL
  Filled 2022-02-02 (×3): qty 1

## 2022-02-02 MED ORDER — SODIUM CHLORIDE 0.9 % IV SOLN
1.0000 g | Freq: Once | INTRAVENOUS | Status: AC
  Administered 2022-02-02: 1 g via INTRAVENOUS
  Filled 2022-02-02: qty 10

## 2022-02-02 MED ORDER — CHLORHEXIDINE GLUCONATE CLOTH 2 % EX PADS
6.0000 | MEDICATED_PAD | Freq: Every day | CUTANEOUS | Status: DC
  Administered 2022-02-04: 6 via TOPICAL

## 2022-02-02 MED ORDER — CARVEDILOL 25 MG PO TABS
25.0000 mg | ORAL_TABLET | Freq: Two times a day (BID) | ORAL | Status: DC
  Administered 2022-02-02 – 2022-02-03 (×2): 25 mg via ORAL
  Filled 2022-02-02: qty 1
  Filled 2022-02-02: qty 2
  Filled 2022-02-02: qty 1

## 2022-02-02 MED ORDER — HEPARIN SODIUM (PORCINE) 5000 UNIT/ML IJ SOLN: 5000.0000 [IU] | Freq: Three times a day (TID) | INTRAMUSCULAR | Status: DC

## 2022-02-02 MED ORDER — ALBUTEROL SULFATE (2.5 MG/3ML) 0.083% IN NEBU
2.5000 mg | INHALATION_SOLUTION | Freq: Once | RESPIRATORY_TRACT | Status: AC
Start: 2022-02-02 — End: 2022-02-02
  Administered 2022-02-02: 2.5 mg via RESPIRATORY_TRACT
  Filled 2022-02-02: qty 3

## 2022-02-02 MED ORDER — SODIUM CHLORIDE 0.9 % IV SOLN
500.0000 mg | Freq: Once | INTRAVENOUS | Status: AC
  Administered 2022-02-02: 500 mg via INTRAVENOUS
  Filled 2022-02-02: qty 5

## 2022-02-02 MED ORDER — METHYLPREDNISOLONE SODIUM SUCC 125 MG IJ SOLR
125.0000 mg | Freq: Once | INTRAMUSCULAR | Status: AC
  Administered 2022-02-02: 125 mg via INTRAVENOUS
  Filled 2022-02-02: qty 2

## 2022-02-02 MED ORDER — NICOTINE 7 MG/24HR TD PT24: 7.0000 mg | MEDICATED_PATCH | Freq: Once | TRANSDERMAL | Status: DC | PRN

## 2022-02-02 NOTE — Assessment & Plan Note (Addendum)
She normally gets dialysis Tuesday, Thursday, Saturday.  She missed her dialysis on Thursday but was able to get it yesterday.  No evidence of volume overload, as above. - Nephrology consulted, appreciate recommendations - Continue dialysis schedule while inpatient.  Anticipate maintaining her normal schedule. - Continue Dialyvite tablets and Renvela with meals

## 2022-02-02 NOTE — Assessment & Plan Note (Signed)
>>  ASSESSMENT AND PLAN FOR ACUTE RESPIRATORY FAILURE WITH HYPOXIA (HCC) WRITTEN ON 02/04/2022 11:07 AM BY MILLER, EMILY, DO  O2 stable on 4 L O2.  Chest x-ray showed a chronic and progressive right middle/lower lung field consolidation concerning for pneumonia.  Previous x-ray in March/2023 showed smaller right lung consolidation.  Reassured by lack of fever and WBC WNL.  Moderate smoking history, 3 to 4 cigarettes/day. - Consult to pulm, appreciate recommendations - Pulmonary prescribed guaifenesin . - Repeat chest x-ray pending. - Continue O2, wean as tolerated - Continue azithromycin  (8/20 - 8/24) and ceftriaxone  (8/20 - 8/22)

## 2022-02-02 NOTE — Assessment & Plan Note (Addendum)
Suspect atypical right middle to lower lobe lobe pneumonia.  Breathing is stable on 4 L of O2.  Patient afebrile, white blood cell count within normal limits.  Viral respiratory panel negative. -Continue azithromycin 8/20 and ceftriaxone 8/20

## 2022-02-02 NOTE — Consult Note (Signed)
Renal Service Consult Note [facility]  Hendy Brindle 02/02/2022 Sol Blazing, MD Requesting Physician: Dr. Andria Frames   Reason for Consult: ESRD pt w/ SOB, cough HPI: The patient is a 46 y.o. year-old w/ hx of anemia, ESRD on HD, HTN, pHTN, seizure d/o who presented to ED w/ c/o SOB for last 4 days, requiring 2L O2 ath ome and 4L [place] in ED. In ED CXR showed a large area of dense consolidation of the R lower and mid fields, no sig edema. Pt was admitted and started on IV abx. We are asked to see for ESRD.    Pt seen in ED.  States she missed HD on Thursday last week because she was watching her grandkids and they didn't have any food so she had to tend to that issue. Started HD in Jan 2019. L arm AVF working good.    ROS - denies CP, no joint pain, no HA, no blurry vision, no rash, no diarrhea, no nausea/ vomiting, no dysuria, no difficulty voiding   Past Medical History  Past Medical History:  Diagnosis Date   Anemia    ESRD on HD    Headache, unspecified 09/26/2017   History of blood transfusion 07/2020   Hypertension    Intracranial hemorrhage (HCC)    LVH (left ventricular hypertrophy)    Pericardial effusion    Pulmonary hypertension ([place])    Seizure ([place]) 02/2019   Shortness of breath 07/15/2018   01/16/21 not currently   Stroke [facility])    Mild was told after having a seizure   Thrombocytopenia ([place])    Past Surgical History  Past Surgical History:  Procedure Laterality Date   AV FISTULA PLACEMENT Left 06/24/2017   Procedure: CREATION OF LEFT ARM BRACHIALCEPHALIC  ARTERIOVENOUS (AV) FISTULA;  Surgeon: Rosetta Posner, MD;  Location: [place];  Service: Vascular;  Laterality: Left;   CESAREAN SECTION     x2   COMPLEX WOUND CLOSURE Left 07/30/2020   Procedure: COMPLEX WOUND CLOSURE;  Surgeon: Cherre Robins, MD;  Location: [place];  Service: Vascular;  Laterality: Left;   FISTULA SUPERFICIALIZATION Left 10/28/2017   Procedure: SUPERFICIALIZATION LEFT BRACHIOCEPHALIC  ARTERIOVENOUS FISTULA;  Surgeon: Rosetta Posner, MD;  Location: [place];  Service: Vascular;  Laterality: Left;   INGUINAL HERNIA REPAIR Bilateral 02/01/2021   Procedure: LAPAROSCOPIC BILATERAL INGUINAL HERNIA REPAIR WITH MESH;  Surgeon: Kinsinger, Arta Bruce, MD;  [facility];  Service: General;  Laterality: Bilateral;   INSERTION OF DIALYSIS CATHETER Right 06/24/2017   Procedure: INSERTION OF TUNNELED DIALYSIS CATHETER;  Surgeon: Rosetta Posner, MD;  [facility];  Service: Vascular;  Laterality: Right;   INSERTION OF DIALYSIS CATHETER Right 07/30/2020   Procedure: INSERTION OF DIALYSIS CATHETER;  Surgeon: Cherre Robins, MD;  [facility];  Service: Vascular;  Laterality: Right;   LIGATION OF COMPETING BRANCHES OF ARTERIOVENOUS FISTULA  10/28/2017   Procedure: LIGATION OF COMPETING BRANCHES OF ARTERIOVENOUS FISTULA x3;  Surgeon: Rosetta Posner, MD;  [facility];  Service: Vascular;;   REVISON OF ARTERIOVENOUS FISTULA Left 07/30/2020   Procedure: LEFT UPPER EXTREMITY ARTERIOVENOUS FISTULA REVISON;  Surgeon: Cherre Robins, MD;  [facility];  Service: Vascular;  Laterality: Left;  PERIPHERAL NERVE BLOCK   Family History  Family History  Problem Relation Age of Onset   Diabetes Father    Hypertension Father    Cancer Father    Heart disease Father    Aneurysm Mother  Seizures Mother    Social History  reports that she has been smoking cigarettes. She has a 3.75 pack-year smoking history. She has never used smokeless tobacco. She reports that she does not currently use alcohol. She reports current drug use. Drug: Marijuana. Allergies  Allergies  Allergen Reactions   Visine [Tetrahydrozoline Hcl] Swelling    Eyes Swelling   Home medications Prior to Admission medications   Medication Sig Start Date End Date Taking? Authorizing Provider  acetaminophen (TYLENOL) 325 MG tablet Take 650 mg by mouth every 6 (six) hours as needed for mild pain or moderate pain (for dialysis  treatment).    [provider]  amLODipine (NORVASC) 10 MG tablet Take 1 tablet (10 mg total) by mouth at bedtime. 12/09/19 01/29/22  Harvie Heck, MD  B Complex-C-Folic Acid (DIALYVITE PO) Take 1 tablet by mouth daily.    [provider]  carvedilol (COREG) 25 MG tablet Take 1 tablet (25 mg total) by mouth 2 (two) times daily with a meal. Patient taking differently: Take 25 mg by mouth 2 (two) times daily with a meal. Patient states they take on Tuesdays, Thursdays, and Saturdays at 1100 and 2000 Take on Sundays, Mondays, Wednesdays, and Fridays at 0630 and 1800 12/09/19 01/29/22  Harvie Heck, MD  hydrALAZINE (APRESOLINE) 100 MG tablet Take 1 tablet (100 mg total) by mouth 3 (three) times daily. 12/09/19 01/30/23  Harvie Heck, MD  levETIRAcetam (KEPPRA) 1000 MG tablet Take 1 tablet (1,000 mg total) by mouth 2 (two) times daily. Patient taking differently: Take 1,000 mg by mouth in the morning. 12/07/20   Penumalli, Earlean Polka, MD  sevelamer carbonate (RENVELA) 800 MG tablet Take 2 tablets (1,600 mg total) by mouth 3 (three) times daily with meals. 02/21/19   Madalyn Rob, MD  spironolactone (ALDACTONE) 50 MG tablet Take 1 tablet (50 mg total) by mouth daily. 12/09/19   Harvie Heck, MD     Vitals:   02/02/22 1430 02/02/22 1621 02/02/22 1621 02/02/22 1748  BP: (!) 134/94  (!) 141/93 129/88  Pulse: 90  95   Resp: (!) 21  (!) 22   Temp:  99.5 F (37.5 C)    TempSrc:  Oral    SpO2: 94%  92%   Weight:      Height:       Exam Gen alert, no distress No rash, cyanosis or gangrene Sclera anicteric, throat clear  No jvd or bruits Chest R clear, L basilar faint rales,  no bronchial BS or e to a changes RRR no MRG Abd soft ntnd no mass or ascites +bs GU defer MS no joint effusions or deformity Ext no LE or UE edema, no wounds or ulcers Neuro is alert, Ox 3 , nf    LUA AVF+ bruit   Home meds include - amlodipine 10, carvedilol 25 bid, hydralazine 100 tid, levetiracetam, sevelamer  carbonate 2 ac tid, spironolactone 50 qd, prns/ vits/ supps   OP HD: [facility] 3h 12mn 350/1.5   51.5kg  2/2 bath  Hep none  LUA AVF - last hep B labs: 12/31/21, +immune - mircera 100 q 2, last 8/15, due 8/29 - last HD 8/19, post wt 51.0kg, has been coming off very close to dry wt - just completed IV Fe load on 8/19    Na 137  K 3.5  CO2 32 BUN 46  creat 7.8  Ca 9.6  Alb 3.4  LFT's okay   BNP 345, trop 19 then 25, WBC 4K Hb  9.1        In ED BPs 135/ 90 range, HR 90-100, RR 20- 27, temp 99.5     CXR - IMPRESSION: Persistent consolidative opacity in the right mid and lower lung progressive since 08/18/2021. While this may be related to chronic, unresolving pneumonia, long persistence raises the question of underlying mass lesion. Consider CT chest with contrast to further evaluate.  Assessment/ Plan: SOB - CT showing combination of GG changes suggestive of edema, and posterior consolidation c/w PNA.  Started on IV abx per primary team. Will plan extra HD tomorrow and try to get volume down as tolerates.  ESRD - on HD TTS.  Missed 1 HD last week. Plan extra HD as tol tomorrow.  HTN/ vol - continues on home BP meds x 3 here, will write hold orders to avoid dropping BP's too low. Not grossly edematous on exam, but imaging suggesting some combination of pulm edema and infection so plan is as above Anemia esrd - Hb 9.1 here, next esa is due on 8/29 as above. Just completed IV Fe load at OP center. Follow.  MBD ckd - CCa in range, will add on phos. Cont renvela as binder. Not on vdra.      Kelly Splinter  MD 02/02/2022, 6:10 PM Recent Labs  Lab 02/02/22 1144  HGB 9.1*  ALBUMIN 3.4*  CALCIUM 9.6  CREATININE 7.87*  K 3.5

## 2022-02-02 NOTE — ED Triage Notes (Signed)
PT BIB [facility] from work c/o [facility] that started while she was there. Pt is a dialysis pt and missed Thursday but was seen yesterday and had a full treatment. Pt checked her O2 at work and it was in the 50's on RA. EMS placed pt on non-rebreather.

## 2022-02-02 NOTE — Assessment & Plan Note (Addendum)
O2 stable on 4 L O2.  Chest x-ray showed right middle/lower lung field consolidation concerning for pneumonia.  Reassured by lack of fever and WBC WNL.  CT chest angiogram ruled out PE.  Less concern for volume overload given that she is at her dry weight, has no evidence of fluid overload on exam, and BNP is below recent baseline.  Status post albuterol and Solu-Medrol x1 in the ED.  Less concern for COPD at this time given no wheeze, modest smoking history, no history of COPD. - Admit to MedSurg, Dr. Andria Frames attending - Continue O2, wean as tolerated - Continue azithromycin and ceftriaxone, de-escalate as able - Consider pulm involvement as may need bronch given duration of opacity on CXR - Recheck CBC and BMP in the morning - Do not see an indication to continue steroids at this time

## 2022-02-02 NOTE — Hospital Course (Addendum)
Madeline Brown is a 46 y.o. female presenting to the ED with 4 days of shortness of breath requiring 2 L of oxygen at home.  Past medical history significant for longstanding hypertension, ESRD on hemodialysis Tuesday Thursday Saturday, history of seizures, history of intracranial hemorrhage, and HFpEF.  Her hospital course is outlined below:  Acute Hypoxemic respiratory failure  Acute Pulmonary Edema:  Patient presented to the ED with increased O2 requirement and SOB ongoing 3-4 days. CXR showed progression of right lung consolidation previously seen March/2023. CT chest ruled out PE and showed bilateral alveolar airspaces opacities concerning for CHF, and bilateral atelectasis. Her respiratory status improved after HD and she was weaned to room air prior to  discharge. Pulmonology consulted and recommended follow up chest CT in 6-8 weeks.    Acute on Chronic diastolic CHF Patient on admission was under her dry weight. She had no signs of volume overload on exam or indicated by labs. Continued her home medications.   ESRD Normally gets dialysis Tuesday, Thursday, Saturday.  She missed dialysis on Thursday was able to get it 8/19.  Dialysis was continued and patient and nephrology was consulted.

## 2022-02-02 NOTE — Assessment & Plan Note (Signed)
BP well controlled on home blood pressure medications.  Patient reports good adherence to home meds. -Continue home Coreg, amlodipine, hydralazine

## 2022-02-02 NOTE — Progress Notes (Signed)
FMTS Brief Progress Note  S: Patient denies chest pain, shortness of breath. Denies n/v/d. She did not have any questions for the team.  O: General: well-appearing and in no acute distress HEENT: normocephalic and atraumatic Cardiovascular: regular rate Respiratory: CTAB posteriorly, normal respiratory effort, and on 4L [place]. Gastrointestinal: non-tender and non-distended Extremities: moving all extremities spontaneously Neuro: following commands and no focal neurological deficits   BP 132/89   Pulse 88   Temp 99.4 F (37.4 C)   Resp (!) 22   Ht 5' (1.524 m)   Wt 112 lb 7 oz (51 kg)   LMP 12/14/2017   SpO2 93%   BMI 21.96 kg/m     A/P: Patient is doing well. No evidence of respiratory distress. Will continue antibiotic treatment and assessment of respiratory status, wean oxygen as tolerated.  - Orders reviewed. Labs for AM ordered, which was adjusted as needed.   Camelia Phenes, MD 02/02/2022, 9:39 PM PGY-1, [place] Night Resident  Please page [phone] with questions.

## 2022-02-02 NOTE — ED Provider Notes (Addendum)
[facility] EMERGENCY DEPARTMENT Provider Note   CSN: [id number] Arrival date & time: 02/02/22  1020     History  Chief Complaint  Patient presents with   Shortness of Breath    Madeline Brown is a 46 y.o. female.   Shortness of Breath Associated symptoms: cough   Patient presents for shortness of breath.  Symptoms started approximately 4 days ago.  She has oxygen at home that she has been prescribed in the past to be taken as needed.  She did start utilizing this 2 days ago.  Today, she was at work.  Shortness of breath worsened.  EMS was called.  EMS noted hypoxia with SPO2 in the 70s on room air.  She was placed on a nonrebreather.  Blood pressure is moderately elevated in the range of 150s over 100s.  Patient's medical history includes HTN, ESRD, seizures, ICH, CHF.  She undergoes dialysis on T, TH, and SA.  She missed Thursday dialysis but did go yesterday.  She denies any areas of pain with her shortness of breath.  She has had recent cough.     Home Medications Prior to Admission medications   Medication Sig Start Date End Date Taking? Authorizing Provider  acetaminophen (TYLENOL) 325 MG tablet Take 650 mg by mouth every 6 (six) hours as needed for mild pain or moderate pain (for dialysis treatment).    [provider]  amLODipine (NORVASC) 10 MG tablet Take 1 tablet (10 mg total) by mouth at bedtime. 12/09/19 01/29/22  Harvie Heck, MD  B Complex-C-Folic Acid (DIALYVITE PO) Take 1 tablet by mouth daily.    [provider]  carvedilol (COREG) 25 MG tablet Take 1 tablet (25 mg total) by mouth 2 (two) times daily with a meal. Patient taking differently: Take 25 mg by mouth 2 (two) times daily with a meal. Patient states they take on Tuesdays, Thursdays, and Saturdays at 1100 and 2000 Take on Sundays, Mondays, Wednesdays, and Fridays at 0630 and 1800 12/09/19 01/29/22  Harvie Heck, MD  hydrALAZINE (APRESOLINE) 100 MG tablet Take 1 tablet (100 mg  total) by mouth 3 (three) times daily. 12/09/19 01/30/23  Harvie Heck, MD  levETIRAcetam (KEPPRA) 1000 MG tablet Take 1 tablet (1,000 mg total) by mouth 2 (two) times daily. Patient taking differently: Take 1,000 mg by mouth in the morning. 12/07/20   Penumalli, Earlean Polka, MD  sevelamer carbonate (RENVELA) 800 MG tablet Take 2 tablets (1,600 mg total) by mouth 3 (three) times daily with meals. 02/21/19   Madalyn Rob, MD  spironolactone (ALDACTONE) 50 MG tablet Take 1 tablet (50 mg total) by mouth daily. 12/09/19   Harvie Heck, MD      Allergies    Visine [tetrahydrozoline hcl]    Review of Systems   Review of Systems  Respiratory:  Positive for cough and shortness of breath.   All other systems reviewed and are negative.   Physical Exam Updated Vital Signs BP (!) 141/93 (BP Location: Right Arm)   Pulse 95   Temp 99.5 F (37.5 C) (Oral)   Resp (!) 22   Ht 5' (1.524 m)   Wt 51 kg   LMP 12/14/2017   SpO2 92%   BMI 21.96 kg/m  Physical Exam Vitals and nursing note reviewed.  Constitutional:      General: She is not in acute distress.    Appearance: She is well-developed. She is not toxic-appearing or diaphoretic.  HENT:     Head: Normocephalic and atraumatic.  Mouth/Throat:     Mouth: Mucous membranes are moist.     Pharynx: Oropharynx is clear.  Eyes:     Extraocular Movements: Extraocular movements intact.     Conjunctiva/sclera: Conjunctivae normal.  Cardiovascular:     Rate and Rhythm: Normal rate and regular rhythm.     Heart sounds: No murmur heard. Pulmonary:     Effort: Pulmonary effort is normal. No respiratory distress.     Breath sounds: Wheezing and rales present.  Chest:     Chest wall: No tenderness.  Abdominal:     Palpations: Abdomen is soft.     Tenderness: There is no abdominal tenderness.  Musculoskeletal:        General: No swelling.     Cervical back: Normal range of motion and neck supple.     Right lower leg: No edema.     Left lower leg: No  edema.  Skin:    General: Skin is warm and dry.     Capillary Refill: Capillary refill takes less than 2 seconds.     Coloration: Skin is not cyanotic or pale.  Neurological:     General: No focal deficit present.     Mental Status: She is alert and oriented to person, place, and time.  Psychiatric:        Mood and Affect: Mood normal.        Behavior: Behavior normal.     ED Results / Procedures / Treatments   Labs (all labs ordered are listed, but only abnormal results are displayed) Labs Reviewed  COMPREHENSIVE METABOLIC PANEL - Abnormal; Notable for the following components:      Result Value   Chloride 92 (*)    Glucose, Bld 102 (*)    BUN 46 (*)    Creatinine, Ser 7.87 (*)    Albumin 3.4 (*)    Alkaline Phosphatase 35 (*)    Total Bilirubin 1.3 (*)    GFR, Estimated 6 (*)    All other components within normal limits  CBC WITH DIFFERENTIAL/PLATELET - Abnormal; Notable for the following components:   RBC 2.77 (*)    Hemoglobin 9.1 (*)    HCT 28.4 (*)    MCV 102.5 (*)    RDW 15.7 (*)    nRBC 0.5 (*)    Lymphs Abs 0.6 (*)    All other components within normal limits  BRAIN NATRIURETIC PEPTIDE - Abnormal; Notable for the following components:   B Natriuretic Peptide 344.6 (*)    All other components within normal limits  TROPONIN I (HIGH SENSITIVITY) - Abnormal; Notable for the following components:   Troponin I (High Sensitivity) 19 (*)    All other components within normal limits  TROPONIN I (HIGH SENSITIVITY) - Abnormal; Notable for the following components:   Troponin I (High Sensitivity) 25 (*)    All other components within normal limits  RESP PANEL BY RT-PCR (FLU A&B, COVID) ARPGX2  I-STAT BETA HCG BLOOD, ED ([facility], [facility], [facility] ONLY)    EKG EKG Interpretation  Date/Time:  Sunday February 02 2022 11:02:04 EDT Ventricular Rate:  100 PR Interval:  220 QRS Duration: 76 QT Interval:  354 QTC Calculation: 457 R Axis:   60 Text Interpretation: Sinus tachycardia  Prolonged PR interval LAE, consider biatrial enlargement Confirmed by Godfrey Pick [phone]) on 02/02/2022 3:41:44 PM  Radiology CT Angio Chest PE W and/or Wo Contrast  Result Date: 02/02/2022 CLINICAL DATA:  Shortness of breath. EXAM: CT ANGIOGRAPHY CHEST WITH CONTRAST TECHNIQUE: Multidetector  CT imaging of the chest was performed using the standard protocol during bolus administration of intravenous contrast. Multiplanar CT image reconstructions and MIPs were obtained to evaluate the vascular anatomy. RADIATION DOSE REDUCTION: This exam was performed according to the departmental dose-optimization program which includes automated exposure control, adjustment of the mA and/or kV according to patient size and/or use of iterative reconstruction technique. CONTRAST:  26m OMNIPAQUE IOHEXOL 350 MG/ML SOLN COMPARISON:  12/07/2019 FINDINGS: Cardiovascular: Satisfactory opacification of the pulmonary arteries to the segmental level. No evidence of pulmonary embolism. Stable cardiomegaly. Coronary artery atherosclerosis. Mild thoracic aortic atherosclerosis. No pericardial effusion. Mediastinum/Nodes: No enlarged mediastinal, hilar, or axillary lymph nodes. Thyroid gland, trachea, and esophagus demonstrate no significant findings. Lungs/Pleura: Bilateral reticulonodular interstitial disease with patchy ground-glass opacities. Bilateral lower lobe dependent airspace disease likely reflecting atelectasis. No pleural effusion or pneumothorax. Upper Abdomen: No acute abnormality. Musculoskeletal: No chest wall abnormality. No acute or significant osseous findings. Review of the MIP images confirms the above findings. IMPRESSION: 1. No pulmonary embolism. 2. Cardiomegaly with bilateral interstitial thickening and alveolar airspace opacities concerning for CHF versus less likely atypical viral pneumonia. 3. Bilateral lower lobe dependent airspace disease likely reflecting atelectasis. 4. Aortic Atherosclerosis (ICD10-I70.0).  Coronary artery atherosclerosis. Electronically Signed   By: HKathreen DevoidM.D.   On: 02/02/2022 15:18   DG Chest Port 1 View  Result Date: 02/02/2022 CLINICAL DATA:  Shortness of breath and dyspnea. EXAM: PORTABLE CHEST 1 VIEW COMPARISON:  12/31/2021 FINDINGS: Persistent consolidative opacity in the right mid and lower lung. There is some minimal residual retrocardiac opacity. No substantial pleural effusion. Cardiopericardial silhouette is at upper limits of normal for size. The visualized bony structures of the thorax are unremarkable. Telemetry leads overlie the chest. IMPRESSION: Persistent consolidative opacity in the right mid and lower lung progressive since 08/18/2021. While this may be related to chronic/unresolving pneumonia, long persistence raises the question of underlying mass lesion. Consider CT chest with contrast to further evaluate. Electronically Signed   By: EMisty StanleyM.D.   On: 02/02/2022 11:00    Procedures Procedures    Medications Ordered in ED Medications  cefTRIAXone (ROCEPHIN) 1 g in sodium chloride 0.9 % 100 mL IVPB (1 g Intravenous New Bag/Given 02/02/22 1619)  azithromycin (ZITHROMAX) 500 mg in sodium chloride 0.9 % 250 mL IVPB (has no administration in time range)  hydrALAZINE (APRESOLINE) tablet 100 mg (has no administration in time range)  DIALYVITE TABLET TABS (has no administration in time range)  amLODipine (NORVASC) tablet 10 mg (has no administration in time range)  sevelamer carbonate (RENVELA) tablet 1,600 mg (has no administration in time range)  spironolactone (ALDACTONE) tablet 50 mg (has no administration in time range)  levETIRAcetam (KEPPRA) tablet 1,000 mg (has no administration in time range)  carvedilol (COREG) tablet 25 mg (has no administration in time range)  albuterol (PROVENTIL) (2.5 MG/3ML) 0.083% nebulizer solution 2.5 mg (2.5 mg Nebulization Given 02/02/22 1132)  iohexol (OMNIPAQUE) 350 MG/ML injection 70 mL (70 mLs Intravenous Contrast  Given 02/02/22 1455)  methylPREDNISolone sodium succinate (SOLU-MEDROL) 125 mg/2 mL injection 125 mg (125 mg Intravenous Given 02/02/22 1612)    ED Course/ Medical Decision Making/ A&P                           Medical Decision Making Amount and/or Complexity of Data Reviewed Labs: ordered. Radiology: ordered.  Risk Prescription drug management. Decision regarding hospitalization.   This patient presents to the ED  for concern of shortness of breath, this involves an extensive number of treatment options, and is a complaint that carries with it a high risk of complications and morbidity.  The differential diagnosis includes pulmonary edema, pneumonia, PE, reactive airway disease, allergic reaction, noxious stimuli, ACS, valvular disorder, pericardial effusion, anemia   Co morbidities that complicate the patient evaluation  HTN, ESRD, seizures, ICH, CHF   Additional history obtained:  Additional history obtained from EMS External records from outside source obtained and reviewed including EMR   Lab Tests:  I Ordered, and personally interpreted labs.  The pertinent results include: Baseline anemia, no leukocytosis, baseline elevation in BNP, baseline elevation in troponin, normal electrolytes, elevated creatinine and BUN consistent with ESRD.   Imaging Studies ordered:  I ordered imaging studies including chest x-ray, CTA chest I independently visualized and interpreted imaging which showed bilateral interstitial thickening and airspace opacities consistent with CHF and/for infection I agree with the radiologist interpretation   Cardiac Monitoring: / EKG:  The patient was maintained on a cardiac monitor.  I personally viewed and interpreted the cardiac monitored which showed an underlying rhythm of: Sinus rhythm   Consultations Obtained:  I requested consultation with the nephrologist, Dr. Melvia Heaps,  and discussed lab and imaging findings as well as pertinent plan - they  recommend: He will schedule the patient for inpatient dialysis.   Problem List / ED Course / Critical interventions / Medication management  Patient is a 46 year old female presenting for shortness of breath.  This has been worsening over the past 4 days and became severe today, prior to arrival.  When EMS arrived on scene, patient had SPO2 in the low 70s on room air.  She was placed on nonrebreather.  On arrival in the ED, patient has mildly increased work of breathing.  She is able to speak in complete sentences.  On lung auscultation, she does have evidence of crackles and wheezing.  She has no prior diagnosis of reactive airway disease.  She was given nebulized breathing treatment and did have improvement in her shortness of breath.  She was able to maintain normal SPO2 on 4 L of supplemental oxygen.  Diagnostic work-up was initiated.  Patient's lab work shows baseline elevations in BNP and troponin.  She underwent x-ray of chest which did show an area of opacity in the right mid and lower lobe.  She underwent CTA of chest to assess for possible PE as well as to further characterize areas of consolidations.  On CTA chest, there were multifocal areas of interstitial thickening and airspace opacities.  Given these findings, I suspect that patient's shortness of breath and hypoxia are multifactorial.  Given her smoking history, recent productive cough, and wheezing on arrival, patient was treated for reactive airway disease exacerbation with Solu-Medrol and antibiotics.  I spoke with neurologist on-call, Dr. Melvia Heaps, who will schedule the patient for dialysis for relief of fluid overload component of her shortness of breath.  Given that she still has a 4 L oxygen requirement, patient was admitted to family medicine for further management. I ordered medication including albuterol for shortness of breath and wheezing; Solu-Medrol and antibiotics for empiric treatment of COPD exacerbation and/or atypical  pneumonia Reevaluation of the patient after these medicines showed that the patient improved I have reviewed the patients home medicines and have made adjustments as needed   Social Determinants of Health:  Has access to outpatient care  CRITICAL CARE Performed by: Godfrey Pick   Total critical care time: 59  minutes  Critical care time was exclusive of separately billable procedures and treating other patients.  Critical care was necessary to treat or prevent imminent or life-threatening deterioration.  Critical care was time spent personally by me on the following activities: development of treatment plan with patient and/or surrogate as well as nursing, discussions with consultants, evaluation of patient's response to treatment, examination of patient, obtaining history from patient or surrogate, ordering and performing treatments and interventions, ordering and review of laboratory studies, ordering and review of radiographic studies, pulse oximetry and re-evaluation of patient's condition.        Final Clinical Impression(s) / ED Diagnoses Final diagnoses:  SOB (shortness of breath)  Acute respiratory failure with hypoxia [facility])    Rx / DC Orders ED Discharge Orders     None         Godfrey Pick, MD 02/02/22 1647    Godfrey Pick, MD 02/02/22 [phone]

## 2022-02-02 NOTE — Assessment & Plan Note (Addendum)
Echo 6 months ago had a normal EF, with severe concentric left ventricular hypertrophy with grade 2 diastolic dysfunction.  BNP today lower than during her recent admissions. -Consider repeating echo outpatient

## 2022-02-02 NOTE — H&P (Cosign Needed Addendum)
Hospital Admission History and Physical Service Pager: [phone]  Patient name: Madeline Brown Medical record number: [id number] Date of Birth: 07-28-1975 Age: 46 y.o. Gender: female  Primary Care Provider: Sonia Side., FNP Consultants: Nephrology  Code Status: Full Preferred Emergency Contact: Fredirick Lathe (cousin) [phone]  Chief Complaint: SOB  Assessment and Plan: Madeline Brown is a 46 y.o. female presenting with shortness of breath ongoing for 4 days requiring 2 L of oxygen at home, now up to 4L [place] in the ED. Differential for this patient's presentation of this includes pneumonia, COPD, CHF exacerbation. XR/CTA findings are highly suggestive of pneumonia.  Patient less likely to be a COPD exacerbation due to x-ray showing consolidation in the right lobe, no wheeze on exam, and no history of COPD.  CHF also lower on the differential due to her being below her normal dry weight, no apparent fluid overload on physical exam and BNP below her baseline.  Her past medical history is significant for longstanding hypertension, ESRD on HD Tuesday Thursday Saturday, history of seizures, history of intracranial hemorrhage, HFpEF.  Acute respiratory failure with hypoxia (HCC) O2 stable on 4 L O2.  Chest x-ray showed right middle/lower lung field consolidation concerning for pneumonia.  Reassured by lack of fever and WBC WNL.  CT chest angiogram ruled out PE.  Less concern for volume overload given that she is at her dry weight, has no evidence of fluid overload on exam, and BNP is below recent baseline.  Status post albuterol and Solu-Medrol x1 in the ED.  Less concern for COPD at this time given no wheeze, modest smoking history, no history of COPD. - Admit to MedSurg, Dr. Andria Frames attending - Continue O2, wean as tolerated - Continue azithromycin and ceftriaxone, de-escalate as able - Consider pulm involvement as may need bronch given duration of opacity on CXR - Recheck CBC and BMP in  the morning - Do not see an indication to continue steroids at this time  ESRD (end stage renal disease) ([place]) She normally gets dialysis Tuesday, Thursday, Saturday.  She missed her dialysis on Thursday but was able to get it yesterday.  No evidence of volume overload, as above. - Nephrology consulted, appreciate recommendations - Continue dialysis schedule while inpatient.  Anticipate maintaining her normal schedule. - Continue Dialyvite tablets and Renvela with meals  Acute on chronic diastolic CHF (congestive heart failure) (HCC) Echo 6 months ago had a normal EF, with severe concentric left ventricular hypertrophy with grade 2 diastolic dysfunction.  BNP today lower than during her recent admissions. -Consider repeating echo outpatient   Hypertension BP reasonably well controlled today.  120s-140s/80s-90s.  Reports good adherence to home meds. -Continue home Coreg, amlodipine, hydralazine  Anemia of chronic disease Hgb 9.1. Around baseline.    Chronic, Stable Conditions Hx of Seizures- continue home keppra   FEN/GI: Renal diet VTE Prophylaxis: Heparin  Disposition: inpatient   History of Present Illness:  Madeline Brown is a 46 y.o. female presenting with shortness of breath ongoing for 4 days requiring 2 L of oxygen at home.  She went to work this morning and reportedly her oxygen was in the 27s.  She called EMS, her O2 sats were in the 70s, per EMS.   She normally gets dialysis TThS, missed Thursday due to feeling sick but attended on Saturday.   On Thursday started being SOB and has been using 2L [place] at home since. O2 sats at home on Thursday were in the low 80s.  Came up to mid-90s on O2. EDW is 51.5kg. Had about 2.6L off during dialysis on Saturday.  She has had a cough over the same period. Her daughter at home likewise has a cough. Doesn't think the cough has been particularly productive, but does feel that the cough has been helpful in "clearing" her chest. No  fever/chills.   In the ED, CTA ruled out PE, her breathing stabilized on 4 L O2, EKG showed sinus tachycardia with possible atrial enlargement.  EDP thought patient might have new onset COPD versus pneumonia and treated with albuterol, Solu-Medrol, and antibiotics.   Review Of Systems: Per HPI with the following additions: Negative for fever, abdominal pain, diarrhea, vomiting, nausea, headache, blurry vision.  Pertinent Past Medical History: ESRD, hypertension, seizure disorder on Keppra  Remainder reviewed in history tab.   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  Pertinent Social History: Tobacco use: Yes-- quit 2 days ago. 4-5 cigs/day.  Alcohol use: None Other Substance use: Occasional marijuana Lives with   Pertinent Family History: Mother with history of epilepsy.   Remainder reviewed in history tab.   Important Outpatient Medications: At home she takes Tylenol, Norvasc 10 mg, Coreg 25 mg, hydralazine 100 mg, 1000 mg Keppra twice daily, 50 mg Aldactone, 800 mg Renvela 3 times daily.  Remainder reviewed in medication history.   Objective: BP 129/88   Pulse 95   Temp 99.5 F (37.5 C) (Oral)   Resp (!) 22   Ht 5' (1.524 m)   Wt 51 kg   LMP 12/14/2017   SpO2 92%   BMI 21.96 kg/m  Exam: General: chronically but not acutely ill-appearing, no acute distress Cardiovascular: Regular rate, rhythm, transmitted fistula sounds Respiratory: Decreased breath sounds on the right, no crackles bilaterally, no wheezes. Normal WOB on 4L Gastrointestinal: Soft, nontender MSK: No peripheral edema  Labs:  CBC BMET  Recent Labs  Lab 02/02/22 1144  WBC 4.4  HGB 9.1*  HCT 28.4*  PLT 151   Recent Labs  Lab 02/02/22 1144  NA 137  K 3.5  CL 92*  CO2 32  BUN 46*  CREATININE 7.87*  GLUCOSE 102*  CALCIUM 9.6    Viral respiratory panel negative  BNP 344.6, decreased from baseline. Troponins  negative  EKG: Regular rate, regular rhythm, normal axis, no ST elevations  Imaging Studies Performed:  Imaging Study CXR: Right middle lobe consolidation, cardiomegaly, sharp intercostal angles  Impression from Radiologist: Persistent consolidative opacity in the right mid and lower lung progressive since 08-18-2021.  While this may be related to chronic/unresolving pneumonia, lung persistent raise a question of underlying mass lesion.  CT chest with contrast to further evaluate.  CT chest: Lung consolidation on the right with free fluid bilaterally, does not appear to be clearly defined mass.  Pression from radiologist:  No pulmonary embolism.   Cardiomegaly bilateral interstitial thickening and alveolar airspace opacities concerning for CHF versus less likely atypical viral pneumonia.   Bilateral lower lobe dependent airspace disease likely reflecting atelectasis.   Aortic atherosclerosis.   Coronary artery atherosclerosis.    Darci Current DO 02/02/2022, 6:42 PM PGY-1, [place] Intern pager: [phone], text pages welcome Secure chat group [place]     I have evaluated this patient along with Dr. Sabra Heck and reviewed the above note, making necessary revisions.  Pearla Dubonnet, MD 02/02/2022, 6:42 PM PGY-2, [place]

## 2022-02-02 NOTE — Assessment & Plan Note (Signed)
Hgb 9.1. Around baseline.

## 2022-02-03 ENCOUNTER — Encounter (HOSPITAL_COMMUNITY): Payer: Self-pay | Admitting: Student

## 2022-02-03 DIAGNOSIS — J189 Pneumonia, unspecified organism: Secondary | ICD-10-CM

## 2022-02-03 DIAGNOSIS — I5033 Acute on chronic diastolic (congestive) heart failure: Secondary | ICD-10-CM

## 2022-02-03 DIAGNOSIS — N186 End stage renal disease: Secondary | ICD-10-CM

## 2022-02-03 DIAGNOSIS — D638 Anemia in other chronic diseases classified elsewhere: Secondary | ICD-10-CM | POA: Diagnosis not present

## 2022-02-03 DIAGNOSIS — J9601 Acute respiratory failure with hypoxia: Secondary | ICD-10-CM

## 2022-02-03 DIAGNOSIS — I10 Essential (primary) hypertension: Secondary | ICD-10-CM | POA: Diagnosis not present

## 2022-02-03 LAB — CBC
HCT: 24.8 % — ABNORMAL LOW (ref 36.0–46.0)
Hemoglobin: 8.2 g/dL — ABNORMAL LOW (ref 12.0–15.0)
MCH: 33.2 pg (ref 26.0–34.0)
MCHC: 33.1 g/dL (ref 30.0–36.0)
MCV: 100.4 fL — ABNORMAL HIGH (ref 80.0–100.0)
Platelets: 141 10*3/uL — ABNORMAL LOW (ref 150–400)
RBC: 2.47 MIL/uL — ABNORMAL LOW (ref 3.87–5.11)
RDW: 15.6 % — ABNORMAL HIGH (ref 11.5–15.5)
WBC: 5.1 10*3/uL (ref 4.0–10.5)
nRBC: 0 % (ref 0.0–0.2)

## 2022-02-03 LAB — MRSA NEXT GEN BY PCR, NASAL: MRSA by PCR Next Gen: NOT DETECTED

## 2022-02-03 LAB — BASIC METABOLIC PANEL
Anion gap: 10 (ref 5–15)
BUN: 69 mg/dL — ABNORMAL HIGH (ref 6–20)
CO2: 30 mmol/L (ref 22–32)
Calcium: 9.7 mg/dL (ref 8.9–10.3)
Chloride: 93 mmol/L — ABNORMAL LOW (ref 98–111)
Creatinine, Ser: 9.89 mg/dL — ABNORMAL HIGH (ref 0.44–1.00)
GFR, Estimated: 5 mL/min — ABNORMAL LOW (ref >60)
Glucose, Bld: 139 mg/dL — ABNORMAL HIGH (ref 70–99)
Potassium: 4.4 mmol/L (ref 3.5–5.1)
Sodium: 133 mmol/L — ABNORMAL LOW (ref 135–145)

## 2022-02-03 LAB — MAGNESIUM: Magnesium: 2.7 mg/dL — ABNORMAL HIGH (ref 1.7–2.4)

## 2022-02-03 MED ORDER — SODIUM CHLORIDE 0.9 % IV SOLN
1.0000 g | INTRAVENOUS | Status: DC
  Administered 2022-02-03: 1 g via INTRAVENOUS
  Filled 2022-02-03 (×2): qty 10

## 2022-02-03 MED ORDER — CHLORHEXIDINE GLUCONATE CLOTH 2 % EX PADS
6.0000 | MEDICATED_PAD | Freq: Every day | CUTANEOUS | Status: DC
  Administered 2022-02-04: 6 via TOPICAL

## 2022-02-03 MED ORDER — GUAIFENESIN ER 600 MG PO TB12
600.0000 mg | ORAL_TABLET | Freq: Two times a day (BID) | ORAL | Status: DC
  Administered 2022-02-03 (×2): 600 mg via ORAL
  Filled 2022-02-03 (×3): qty 1

## 2022-02-03 NOTE — Progress Notes (Signed)
Daily Progress Note Intern Pager: [phone]  Patient name: Madeline Brown Medical record number: [id number] Date of birth: July 18, 1975 Age: 46 y.o. Gender: female  Primary Care Provider: Sonia Side., FNP Consultants: Nephrology, Pulmonology  Code Status: Full   Pt Overview and Major Events to Date:  - Admitted to [facility]MTS 8/21  Assessment and Plan:  Madeline Brown is a 46 y.o. female presenting with SOB requiring 4 L of O2 and right chronic lung consolidation.  Past medical history is significant for longstanding hypertension, ESRD on hemodialysis, history of seizures on Keppra, history of intracranial hemorrhage, HFpEF.  Acute respiratory failure with hypoxia (HCC) O2 stable on 4 L O2.  Chest x-ray showed a chronic and progressive right middle/lower lung field consolidation concerning for pneumonia.  Previous x-ray in March/2023 showed smaller right lung consolidation.  Reassured by lack of fever and WBC WNL.  Moderate smoking history, 3 to 4 cigarettes/day. - Consult to pulm, appreciate recommendations - Pulmonary prescribed guaifenesin.  They recommended trending her fever, white blood cell count, CXR. - Centering around for cultures and biopsies - Continue O2, wean as tolerated - Continue azithromycin (8/20 - ) and ceftriaxone (8/20 - )  ESRD (end stage renal disease) ([place]) She normally gets dialysis Tuesday, Thursday, Saturday.  She missed her dialysis on Thursday but was able to get it yesterday.  No evidence of volume overload, as above. - Nephrology consulted, appreciate recommendations - Continue dialysis schedule while inpatient.  Anticipate maintaining her normal schedule. - Continue Dialyvite tablets and Renvela with meals  Acute on chronic diastolic CHF (congestive heart failure) (HCC) Echo 6 months ago had a normal EF, with severe concentric left ventricular hypertrophy with grade 2 diastolic dysfunction.  BNP today lower than during her recent  admissions. -Consider repeating echo outpatient   Hypertension BP well controlled on home blood pressure medications.  Patient reports good adherence to home meds. -Continue home Coreg, amlodipine, hydralazine  Anemia of chronic disease Hemoglobin dropped from 9.1 > 8.2 this morning.  Appears to be around her baseline.  - Recheck hemoglobin tomorrow. - Transfuse <7   Hyponatremia Sodium this morning 133. - Recheck BMP tomorrow    FEN/GI: Renal diet PPx: Mechanical prophylaxis due to Padua score of 1 Dispo:Home in 2-3 days. Barriers include pending medical work-up.   Subjective:  Patient is resting comfortably in dialysis.  No acute events overnight.  Denies new onset shortness of breath, chest pain, increased work of breathing.  Objective: Temp:  [97.9 F (36.6 C)-99.5 F (37.5 C)] 97.9 F (36.6 C) (08/21 1151) Pulse Rate:  [81-95] 90 (08/21 1151) Resp:  [14-28] 20 (08/21 1151) BP: (105-141)/(76-106) 105/76 (08/21 1151) SpO2:  [92 %-100 %] 96 % (08/21 1151) FiO2 (%):  [97 %-98 %] 97 % (08/21 0906) Weight:  [50.6 kg-52.7 kg] 50.6 kg (08/21 1205) Physical Exam: General: Chronically ill-appearing, no acute distress Cardiovascular: Regular rate and rhythm, murmur appreciated on exam Respiratory: Normal breath sounds on the left, right lung no crackles, minimally decreased breath sounds, improved exam from admission.  No increased work of breathing. Abdomen: Soft, nontender Extremities: No peripheral edema  Laboratory: Most recent CBC Lab Results  Component Value Date   WBC 5.1 02/03/2022   HGB 8.2 (L) 02/03/2022   HCT 24.8 (L) 02/03/2022   MCV 100.4 (H) 02/03/2022   PLT 141 (L) 02/03/2022   Most recent BMP    Latest Ref Rng & Units 02/03/2022    3:21 AM  BMP  Glucose  70 - 99 mg/dL 139   BUN 6 - 20 mg/dL 69   Creatinine 0.44 - 1.00 mg/dL 9.89   Sodium 135 - 145 mmol/L 133   Potassium 3.5 - 5.1 mmol/L 4.4   Chloride 98 - 111 mmol/L 93   CO2 22 - 32 mmol/L 30    Calcium 8.9 - 10.3 mg/dL 9.7    Sodium dropped 133, follow-up evening BMP Hemoglobin dropped 9.1-8.2  Imaging/Diagnostic Tests: No new tests  Darci Current, DO 02/03/2022, 12:08 PM  PGY-1, [place] Intern pager: [phone], text pages welcome Secure chat group [place]

## 2022-02-03 NOTE — ED Notes (Signed)
Pt ambulated to bathroom without assistance with steady gait

## 2022-02-03 NOTE — Assessment & Plan Note (Signed)
Sodium this morning 133. - Recheck BMP tomorrow

## 2022-02-03 NOTE — Progress Notes (Signed)
[facility] Progress Note   Subjective:   Seen on HD. Says she is feeling better already, SOB is improved. Denies CP, dizziness, nausea. On O2 2L.   Objective Vitals:   02/03/22 0400 02/03/22 0700 02/03/22 0727 02/03/22 0745  BP: 118/80 135/81  (!) 136/106  Pulse: 81 83  92  Resp: 20 (!) 25  15  Temp:   98.3 F (36.8 C) 98.1 F (36.7 C)  TempSrc:   Oral Oral  SpO2: 100% 100%  96%  Weight:      Height:       Physical Exam General: WDWN female, alert and in NAD Heart: RRR, no murmurs, rubs or gallops Lungs: Decreased breath sounds R lower lobe, no wheezing, rhonchi or rales auscultated Abdomen: Soft, non-distended, +BS Extremities: No edema b/l lower extremities Dialysis Access: LUE AVF accessed  Additional Objective Labs: Basic Metabolic Panel: Recent Labs  Lab 02/02/22 1144 02/03/22 0321  NA 137 133*  K 3.5 4.4  CL 92* 93*  CO2 32 30  GLUCOSE 102* 139*  BUN 46* 69*  CREATININE 7.87* 9.89*  CALCIUM 9.6 9.7   Liver Function Tests: Recent Labs  Lab 02/02/22 1144  AST 15  ALT 10  ALKPHOS 35*  BILITOT 1.3*  PROT 7.0  ALBUMIN 3.4*   No results for input(s): "LIPASE", "AMYLASE" in the last 168 hours. CBC: Recent Labs  Lab 02/02/22 1144 02/03/22 0321  WBC 4.4 5.1  NEUTROABS 3.4  --   HGB 9.1* 8.2*  HCT 28.4* 24.8*  MCV 102.5* 100.4*  PLT 151 141*   Blood Culture    Component Value Date/Time   SDES BLOOD RIGHT WRIST 08/07/2020 0044   SPECREQUEST  08/07/2020 0044    BOTTLES DRAWN AEROBIC AND ANAEROBIC Blood Culture adequate volume   CULT  08/07/2020 0044    NO GROWTH 5 DAYS Performed at [facility], [place] [address] [place], [place] [address]    REPTSTATUS 08/12/2020 FINAL 08/07/2020 0044    Cardiac Enzymes: No results for input(s): "CKTOTAL", "CKMB", "CKMBINDEX", "TROPONINI" in the last 168 hours. CBG: No results for input(s): "GLUCAP" in the last 168 hours. Iron Studies: No results for input(s): "IRON", "TIBC",  "TRANSFERRIN", "FERRITIN" in the last 72 hours. '@lablastinr3'$ @ Studies/Results: CT Angio Chest PE W and/or Wo Contrast  Result Date: 02/02/2022 CLINICAL DATA:  Shortness of breath. EXAM: CT ANGIOGRAPHY CHEST WITH CONTRAST TECHNIQUE: Multidetector CT imaging of the chest was performed using the standard protocol during bolus administration of intravenous contrast. Multiplanar CT image reconstructions and MIPs were obtained to evaluate the vascular anatomy. RADIATION DOSE REDUCTION: This exam was performed according to the departmental dose-optimization program which includes automated exposure control, adjustment of the mA and/or kV according to patient size and/or use of iterative reconstruction technique. CONTRAST:  57m OMNIPAQUE IOHEXOL 350 MG/ML SOLN COMPARISON:  12/07/2019 FINDINGS: Cardiovascular: Satisfactory opacification of the pulmonary arteries to the segmental level. No evidence of pulmonary embolism. Stable cardiomegaly. Coronary artery atherosclerosis. Mild thoracic aortic atherosclerosis. No pericardial effusion. Mediastinum/Nodes: No enlarged mediastinal, hilar, or axillary lymph nodes. Thyroid gland, trachea, and esophagus demonstrate no significant findings. Lungs/Pleura: Bilateral reticulonodular interstitial disease with patchy ground-glass opacities. Bilateral lower lobe dependent airspace disease likely reflecting atelectasis. No pleural effusion or pneumothorax. Upper Abdomen: No acute abnormality. Musculoskeletal: No chest wall abnormality. No acute or significant osseous findings. Review of the MIP images confirms the above findings. IMPRESSION: 1. No pulmonary embolism. 2. Cardiomegaly with bilateral interstitial thickening and alveolar airspace opacities concerning for CHF  versus less likely atypical viral pneumonia. 3. Bilateral lower lobe dependent airspace disease likely reflecting atelectasis. 4. Aortic Atherosclerosis (ICD10-I70.0). Coronary artery atherosclerosis. Electronically  Signed   By: Kathreen Devoid M.D.   On: 02/02/2022 15:18   DG Chest Port 1 View  Result Date: 02/02/2022 CLINICAL DATA:  Shortness of breath and dyspnea. EXAM: PORTABLE CHEST 1 VIEW COMPARISON:  12/31/2021 FINDINGS: Persistent consolidative opacity in the right mid and lower lung. There is some minimal residual retrocardiac opacity. No substantial pleural effusion. Cardiopericardial silhouette is at upper limits of normal for size. The visualized bony structures of the thorax are unremarkable. Telemetry leads overlie the chest. IMPRESSION: Persistent consolidative opacity in the right mid and lower lung progressive since 08/18/2021. While this may be related to chronic/unresolving pneumonia, long persistence raises the question of underlying mass lesion. Consider CT chest with contrast to further evaluate. Electronically Signed   By: Misty Stanley M.D.   On: 02/02/2022 11:00   Medications:   amLODipine  10 mg Oral QHS   azithromycin  250 mg Oral Daily   carvedilol  25 mg Oral BID WC   Chlorhexidine Gluconate Cloth  6 each Topical Q0600   hydrALAZINE  100 mg Oral TID   levETIRAcetam  1,000 mg Oral Daily   multivitamin  1 tablet Oral QHS   sevelamer carbonate  1,600 mg Oral TID WC   spironolactone  50 mg Oral Daily    Outpatient Dialysis Orders: [facility] 3h 79mn 350/1.5   51.5kg  2/2 bath  Hep none  LUA AVF - last hep B labs: 12/31/21, +immune - mircera 100 q 2, last 8/15, due 8/29 - last HD 8/19, post wt 51.0kg, has been coming off very close to dry wt - just completed IV Fe load on 8/19    Assessment/Plan: SOB - CT showing combination of GG changes suggestive of edema, and posterior consolidation c/w PNA.  Started on IV abx per primary team. Extra HD today for volume removal.  ESRD - on HD TTS.  Missed 1 HD last week. Extra HD today, resume TTS schedule tomorrow.  HTN/ vol - BP controlled, continue home meds. Not grossly edematous on exam, but imaging suggesting some combination of pulm  edema and infection so plan is as above Anemia esrd - Hb 8.2 here, next esa is due on 8/29 as above. Just completed IV Fe load at OP center. Follow.  MBD ckd - CCa in range, will add on phos. Cont renvela as binder. Not on vdra.  SAnice Paganini PA-C 02/03/2022, 8:17 AM  [facility] Pager: [phone]

## 2022-02-03 NOTE — Progress Notes (Signed)
NAME:  Madeline Brown, MRN:  [id number], DOB:  May 22, 1976, LOS: 1 ADMISSION DATE:  02/02/2022, CONSULTATION DATE:  02/03/2022 REFERRING MD:  [facility], CHIEF COMPLAINT:  Progressive consolidation right mid to lower lobe   History of Present Illness:  46 year old current every day smoker on home O2 only as needed with PMH significant for ESRD on HD T, Th, Sat, HTN, pHTN, anemia  and seizure disorder,  who presented to ED 02/03/2020 with 4 day history of worsening shortness of breath requiring 4 L oxygen in the ED. Initial CXR in the ED showed   a large area of dense consolidation of the R lower and mid fields, Progressive since 08/2021.  Subsequent CTA showed no PE, and cardiomegaly with bilateral interstitial thickening and alveolar airspace opacities concerning for CHF versus less likely atypical viral pneumonia. In the ED she had low grade fever, but WBC 4.4 and 5.1.  Patient denies fever, chills, cough, + sputum production that she swallows so unaware of color or if any blood,  but states it is thick. No sick contact, no  history of autoimmune disorders or family history that she is aware of .  Pt was admitted by family medicine and started on IV abx . They have asked Pulmonary to see as a non -urgent Pulmonary consult to assist with care. Of note HGB did drop from 9.1 to 8.2 overnight . She is being followed by renal who have planned to HD today 8/21.   Started smoking at 87, states she has only smoked about 1/2 ppd. So pack year Hx is 13.5. States she has had weight loss of about 35 pounds since 2019 when she started HD. She attributes this weight loss to starting HD.   Pertinent  Medical History   Past Medical History:  Diagnosis Date   Anemia    ESRD on HD    Headache, unspecified 09/26/2017   History of blood transfusion 07/2020   Hypertension    Intracranial hemorrhage (HCC)    LVH (left ventricular hypertrophy)    Pericardial effusion    Pulmonary hypertension (HCC)     Seizure ([place]) 02/2019   Shortness of breath 07/15/2018   01/16/21 not currently   Stroke [facility])    Mild was told after having a seizure   Thrombocytopenia ([place])      Significant Hospital Events: Including procedures, antibiotic start and stop dates in addition to other pertinent events   02/02/2022 Admission  8/21 HD and Pulm consult  Interim History / Subjective:  States she is feeling better today. Currently receiving HD. Oxygen has been weaned to 3 L [place]. She denies any fever, but states she has been going through early menopause and that she has night sweats due to that.  HGB drop overnight from 9.1 to 8.2,  WBC 5.1, platelets 141, ? Hemodilutional Na 133 from 137, K 4.4, CO2 30, BUN 69, Creatinine 9.89 8/20 labs >> Hep B reactive, Troponin 25 , BNP 344.6   Objective   Blood pressure 135/81, pulse 83, temperature 98.3 F (36.8 C), temperature source Oral, resp. rate (!) 25, height 5' (1.524 m), weight 51 kg, last menstrual period 12/14/2017, SpO2 100 %, unknown if currently breastfeeding.       No intake or output data in the 24 hours ending 02/03/22 0748 Filed Weights   02/02/22 1034  Weight: 51 kg    Examination: General: Awake and alert, receiving HD vis L AVF, pleasant , in no distress HENT: NCAT,  No LAD Lungs: Bilateral chest excursion , Clear upper lobes, diminished per bases bilaterally, oxygen per [place] at 3 L  Cardiovascular: S1, S2, RRR, No RMG Abdomen: Sofy, NT, ND, BS +, Body mass index is 22.69 kg/m.  Extremities: No obvious deformities noted , trace edema , warm to touch, L AVF with bruit and thrill noted Neuro: Awake and alert, oriented x 3, MAE x 4, apprioriate GU:  Not assessed  Resolved Hospital Problem list     Assessment & Plan:  Acute on Chronic Hypoxemia 2/2 Progressive Consolidation right middle and lower lobes. Atelectasis Pulmonary HTN >> Last echo 10/22 showed mildly elevated Plan  Continue Antibiotics per primary team  Trend fever curve and  WBC Titrate oxygen for sats 90-95% given Pulmonary HTN Aggressive Pulmonary Toilet with IS and flutter valve  Guaifenesin as mucolytic PT/OT OOB or up in chair Consider bronch for cultures and biopsy ( if  needed ) Trend CXR  Sputum for Cx , AFB and Fungal Trend and replete Mag Consider Echo HD as ordered per renal  Check Mag and replete if low   HTN Plan  Per Primary Team  Trend troponin until clears   ESRD 2/2 HTN Missed HD 8/17 Plan Followed by renal For HD 8/21  Seizure Disorder Plan Continue Keppra  Per Primary Team   Anemia of chronic disease 2/2 CKD Plan Per Primary Team  Trend CBC and Transfuse for HGB < 7  Current Every Day Smoker  Smokes 1/2 PPD since 19 ( 13.5 pack year smoking history) Plan Counseled on Smoking cessation  Please ensure she knows she can call 1-800-QUIT NOW for free nicotine patches , gum or mints to help her quit.   Magdalen Spatz, MSN, AGACNP-BC [place] for personal pager PCCM on call pager [phone]  02/03/2022. 9:16 AM    Best Practice (right click and "Reselect all SmartList Selections" daily)   Diet/type: Regular consistency (see orders) Renal carb modified DVT prophylaxis: SCD GI prophylaxis: N/A Lines: N/A Foley:  N/A Code Status:  full code Last date of multidisciplinary goals of care discussion [Pt. Updates at bedside 02/03/2022]  Labs   CBC: Recent Labs  Lab 02/02/22 1144 02/03/22 0321  WBC 4.4 5.1  NEUTROABS 3.4  --   HGB 9.1* 8.2*  HCT 28.4* 24.8*  MCV 102.5* 100.4*  PLT 151 141*    Basic Metabolic Panel: Recent Labs  Lab 02/02/22 1144 02/03/22 0321  NA 137 133*  K 3.5 4.4  CL 92* 93*  CO2 32 30  GLUCOSE 102* 139*  BUN 46* 69*  CREATININE 7.87* 9.89*  CALCIUM 9.6 9.7   GFR: Estimated Creatinine Clearance: 5.2 mL/min (A) (by C-G formula based on SCr of 9.89 mg/dL (H)). Recent Labs  Lab 02/02/22 1144 02/03/22 0321  WBC 4.4 5.1    Liver  Function Tests: Recent Labs  Lab 02/02/22 1144  AST 15  ALT 10  ALKPHOS 35*  BILITOT 1.3*  PROT 7.0  ALBUMIN 3.4*   No results for input(s): "LIPASE", "AMYLASE" in the last 168 hours. No results for input(s): "AMMONIA" in the last 168 hours.  ABG    Component Value Date/Time   TCO2 27 07/30/2020 0822     Coagulation Profile: No results for input(s): "INR", "PROTIME" in the last 168 hours.  Cardiac Enzymes: No results for input(s): "CKTOTAL", "CKMB", "CKMBINDEX", "TROPONINI" in the last 168 hours.  HbA1C: Hgb A1c MFr Bld  Date/Time Value Ref Range Status  02/21/2019  04:25 AM 4.3 (L) 4.8 - 5.6 % Final    Comment:    (NOTE) Pre diabetes:          5.7%-6.4% Diabetes:              >6.4% Glycemic control for   <7.0% adults with diabetes   02/13/2017 08:27 AM 4.6 (L) 4.8 - 5.6 % Final    Comment:    (NOTE) Pre diabetes:          5.7%-6.4% Diabetes:              >6.4% Glycemic control for   <7.0% adults with diabetes     CBG: No results for input(s): "GLUCAP" in the last 168 hours.  Review of Systems:   + Shortness of breath, Coughs up thick secretions, but swallows them , denies fever , but has night sweats she attributes to early menopause. She denies getting choked on food.  Weight loss of 35 pounds since starting HD in 2019 that she attributes to  starting HD.   Past Medical History:  She,  has a past medical history of Anemia, ESRD on HD, Headache, unspecified (09/26/2017), History of blood transfusion (07/2020), Hypertension, Intracranial hemorrhage ([place]), LVH (left ventricular hypertrophy), Pericardial effusion, Pulmonary hypertension ([place]), Seizure ([place]) (02/2019), Shortness of breath (07/15/2018), Stroke ([place]), and Thrombocytopenia ([place]).   Surgical History:   Past Surgical History:  Procedure Laterality Date   AV FISTULA PLACEMENT Left 06/24/2017   Procedure: CREATION OF LEFT ARM BRACHIALCEPHALIC  ARTERIOVENOUS (AV) FISTULA;  Surgeon: Rosetta Posner, MD;   Location: [facility];  Service: Vascular;  Laterality: Left;   CESAREAN SECTION     x2   COMPLEX WOUND CLOSURE Left 07/30/2020   Procedure: COMPLEX WOUND CLOSURE;  Surgeon: Cherre Robins, MD;  Location: [facility];  Service: Vascular;  Laterality: Left;   FISTULA SUPERFICIALIZATION Left 10/28/2017   Procedure: SUPERFICIALIZATION LEFT BRACHIOCEPHALIC ARTERIOVENOUS FISTULA;  Surgeon: Rosetta Posner, MD;  Location: [facility];  Service: Vascular;  Laterality: Left;   INGUINAL HERNIA REPAIR Bilateral 02/01/2021   Procedure: LAPAROSCOPIC BILATERAL INGUINAL HERNIA REPAIR WITH MESH;  Surgeon: Kinsinger, Arta Bruce, MD;  Location: [facility];  Service: General;  Laterality: Bilateral;   INSERTION OF DIALYSIS CATHETER Right 06/24/2017   Procedure: INSERTION OF TUNNELED DIALYSIS CATHETER;  Surgeon: Rosetta Posner, MD;  Location: [facility];  Service: Vascular;  Laterality: Right;   INSERTION OF DIALYSIS CATHETER Right 07/30/2020   Procedure: INSERTION OF DIALYSIS CATHETER;  Surgeon: Cherre Robins, MD;  Location: [facility];  Service: Vascular;  Laterality: Right;   LIGATION OF COMPETING BRANCHES OF ARTERIOVENOUS FISTULA  10/28/2017   Procedure: LIGATION OF COMPETING BRANCHES OF ARTERIOVENOUS FISTULA x3;  Surgeon: Rosetta Posner, MD;  Location: [facility];  Service: Vascular;;   REVISON OF ARTERIOVENOUS FISTULA Left 07/30/2020   Procedure: LEFT UPPER EXTREMITY ARTERIOVENOUS FISTULA REVISON;  Surgeon: Cherre Robins, MD;  Location: [facility];  Service: Vascular;  Laterality: Left;  PERIPHERAL NERVE BLOCK     Social History:   reports that she has been smoking cigarettes. She has a 3.75 pack-year smoking history. She has never used smokeless tobacco. She reports that she does not currently use alcohol. She reports current drug use. Drug: Marijuana.   Family History:  Her family history includes Aneurysm in her mother; Cancer in her father; Diabetes in her father; Heart disease in her father; Hypertension in her father; Seizures in her mother.    Allergies Allergies  Allergen Reactions  Visine [Tetrahydrozoline Hcl] Swelling    Eyes Swelling     Home Medications  Prior to Admission medications   Medication Sig Start Date End Date Taking? Authorizing Provider  acetaminophen (TYLENOL) 325 MG tablet Take 650 mg by mouth every 6 (six) hours as needed for mild pain or moderate pain (for dialysis treatment).    [provider]  amLODipine (NORVASC) 10 MG tablet Take 1 tablet (10 mg total) by mouth at bedtime. 12/09/19 01/29/22  Harvie Heck, MD  B Complex-C-Folic Acid (DIALYVITE PO) Take 1 tablet by mouth daily.    [provider]  carvedilol (COREG) 25 MG tablet Take 1 tablet (25 mg total) by mouth 2 (two) times daily with a meal. Patient taking differently: Take 25 mg by mouth 2 (two) times daily with a meal. Patient states they take on Tuesdays, Thursdays, and Saturdays at 1100 and 2000 Take on Sundays, Mondays, Wednesdays, and Fridays at 0630 and 1800 12/09/19 01/29/22  Harvie Heck, MD  hydrALAZINE (APRESOLINE) 100 MG tablet Take 1 tablet (100 mg total) by mouth 3 (three) times daily. 12/09/19 01/30/23  Harvie Heck, MD  levETIRAcetam (KEPPRA) 1000 MG tablet Take 1 tablet (1,000 mg total) by mouth 2 (two) times daily. Patient taking differently: Take 1,000 mg by mouth in the morning. 12/07/20   Penumalli, Earlean Polka, MD  sevelamer carbonate (RENVELA) 800 MG tablet Take 2 tablets (1,600 mg total) by mouth 3 (three) times daily with meals. 02/21/19   Madalyn Rob, MD  spironolactone (ALDACTONE) 50 MG tablet Take 1 tablet (50 mg total) by mouth daily. 12/09/19   Harvie Heck, MD     Pulmonary APP Time 45 minutes    Magdalen Spatz, MSN, AGACNP-BC [place] for personal pager PCCM on call pager [phone]  02/03/2022 9:19 AM

## 2022-02-03 NOTE — Progress Notes (Signed)
New Admission Note:   Arrival Method: stretcher  Mental Orientation: alert and oriented  Telemetry: Assessment: Completed Skin: intact  UA:UEBVP hand  Pain: 0/10  Tubes: none  Safety Measures: Safety Fall Prevention Plan has been given, discussed and signed Admission: Completed [facility] Orientation: Patient has been orientated to the room, unit and staff.  Family: none   Orders have been reviewed and implemented. Will continue to monitor the patient. Call light has been placed within reach and bed alarm has been activated.   Mairi Stagliano RN [facility] Phone: [phone]

## 2022-02-04 ENCOUNTER — Other Ambulatory Visit (HOSPITAL_COMMUNITY): Payer: Self-pay

## 2022-02-04 ENCOUNTER — Telehealth: Payer: Self-pay | Admitting: Pulmonary Disease

## 2022-02-04 ENCOUNTER — Inpatient Hospital Stay (HOSPITAL_COMMUNITY): Payer: Medicare HMO

## 2022-02-04 DIAGNOSIS — D638 Anemia in other chronic diseases classified elsewhere: Secondary | ICD-10-CM | POA: Diagnosis not present

## 2022-02-04 DIAGNOSIS — J189 Pneumonia, unspecified organism: Principal | ICD-10-CM

## 2022-02-04 DIAGNOSIS — N186 End stage renal disease: Secondary | ICD-10-CM

## 2022-02-04 DIAGNOSIS — I5033 Acute on chronic diastolic (congestive) heart failure: Secondary | ICD-10-CM

## 2022-02-04 DIAGNOSIS — R0609 Other forms of dyspnea: Secondary | ICD-10-CM | POA: Diagnosis not present

## 2022-02-04 DIAGNOSIS — J9601 Acute respiratory failure with hypoxia: Secondary | ICD-10-CM | POA: Diagnosis not present

## 2022-02-04 LAB — RENAL FUNCTION PANEL
Albumin: 3.1 g/dL — ABNORMAL LOW (ref 3.5–5.0)
Anion gap: 14 (ref 5–15)
BUN: 63 mg/dL — ABNORMAL HIGH (ref 6–20)
CO2: 28 mmol/L (ref 22–32)
Calcium: 9.4 mg/dL (ref 8.9–10.3)
Chloride: 95 mmol/L — ABNORMAL LOW (ref 98–111)
Creatinine, Ser: 7.31 mg/dL — ABNORMAL HIGH (ref 0.44–1.00)
GFR, Estimated: 7 mL/min — ABNORMAL LOW (ref >60)
Glucose, Bld: 105 mg/dL — ABNORMAL HIGH (ref 70–99)
Phosphorus: 6.5 mg/dL — ABNORMAL HIGH (ref 2.5–4.6)
Potassium: 4.1 mmol/L (ref 3.5–5.1)
Sodium: 137 mmol/L (ref 135–145)

## 2022-02-04 LAB — ECHOCARDIOGRAM COMPLETE
AR max vel: 2.14 cm2
AV Area VTI: 2.53 cm2
AV Area mean vel: 2.03 cm2
AV Mean grad: 7 mmHg
AV Peak grad: 12.3 mmHg
Ao pk vel: 1.75 m/s
Area-P 1/2: 4.49 cm2
Height: 60 in
S' Lateral: 2.6 cm
Weight: 1787.2 oz

## 2022-02-04 LAB — CBC
HCT: 25.4 % — ABNORMAL LOW (ref 36.0–46.0)
Hemoglobin: 8.2 g/dL — ABNORMAL LOW (ref 12.0–15.0)
MCH: 32.3 pg (ref 26.0–34.0)
MCHC: 32.3 g/dL (ref 30.0–36.0)
MCV: 100 fL (ref 80.0–100.0)
Platelets: 150 10*3/uL (ref 150–400)
RBC: 2.54 MIL/uL — ABNORMAL LOW (ref 3.87–5.11)
RDW: 15.9 % — ABNORMAL HIGH (ref 11.5–15.5)
WBC: 7 10*3/uL (ref 4.0–10.5)
nRBC: 0.3 % — ABNORMAL HIGH (ref 0.0–0.2)

## 2022-02-04 LAB — HEPATITIS B SURFACE ANTIBODY, QUANTITATIVE: Hep B S AB Quant (Post): 365.3 m[IU]/mL (ref >9.9)

## 2022-02-04 LAB — MAGNESIUM: Magnesium: 2.3 mg/dL (ref 1.7–2.4)

## 2022-02-04 MED ORDER — CARVEDILOL 25 MG PO TABS: 25.0000 mg | ORAL_TABLET | ORAL | Status: DC

## 2022-02-04 MED ORDER — AZITHROMYCIN 250 MG PO TABS
250.0000 mg | ORAL_TABLET | Freq: Every day | ORAL | 0 refills | Status: AC
  Filled 2022-02-04: qty 3, 3d supply, fill #0

## 2022-02-04 NOTE — TOC Initial Note (Signed)
Transition of Care [facility]) - Initial/Assessment Note    Patient Details  Name: Madeline Brown MRN: [id number] Date of Birth: 1976/03/03  Transition of Care [facility]) CM/SW Contact:    Pollie Friar, RN Phone Number: 02/04/2022, 11:47 AM  Clinical Narrative:                 Pt is from home with minor children.  She denies any issues with home medications and denies issues with transportation. She has home oxygen through [employer] that she uses PRN. TOC following for dc needs.   Expected Discharge Plan: Home/Self Care Barriers to Discharge: Continued Medical Work up   Patient Goals and CMS Choice        Expected Discharge Plan and Services Expected Discharge Plan: Home/Self Care   Discharge Planning Services: CM Consult   Living arrangements for the past 2 months: Apartment                                      Prior Living Arrangements/Services Living arrangements for the past 2 months: Apartment Lives with:: Minor Children Patient language and need for interpreter reviewed:: Yes Do you feel safe going back to the place where you live?: Yes          Current home services: DME (oxygen with [employer]) Criminal Activity/Legal Involvement Pertinent to Current Situation/Hospitalization: No - Comment as needed  Activities of Daily Living Home Assistive Devices/Equipment: Blood pressure cuff, Oxygen ADL Screening (condition at time of admission) Patient's cognitive ability adequate to safely complete daily activities?: Yes Is the patient deaf or have difficulty hearing?: No Does the patient have difficulty seeing, even when wearing glasses/contacts?: No Does the patient have difficulty concentrating, remembering, or making decisions?: No Patient able to express need for assistance with ADLs?: Yes Does the patient have difficulty dressing or bathing?: No Independently performs ADLs?: Yes (appropriate for developmental age) Does the patient have difficulty walking  or climbing stairs?: No Weakness of Legs: None Weakness of Arms/Hands: None  Permission Sought/Granted                  Emotional Assessment Appearance:: Appears stated age Attitude/Demeanor/Rapport: Engaged Affect (typically observed): Accepting Orientation: : Oriented to Self, Oriented to Place, Oriented to  Time, Oriented to Situation   Psych Involvement: No (comment)  Admission diagnosis:  Pneumonia [J18.9] SOB (shortness of breath) [R06.02] Acute respiratory failure with hypoxia ([place]) [J96.01] Patient Active Problem List   Diagnosis Date Noted   Pneumonia due to infectious organism    Acute on chronic diastolic CHF (congestive heart failure) ([place]) 08/18/2021   Pancytopenia ([place]) 07/29/2021   Volume overload 07/29/2021   Hypoxemia    Fluid overload 46/65/9935   Complication of vascular dialysis catheter 09/17/2020   Other mechanical complication of surgically created arteriovenous fistula, subsequent encounter 07/25/2020   Fluid overload, unspecified 12/09/2019   Acute respiratory failure with hypoxia ([place]) 12/08/2019   Hypertensive urgency 12/07/2019   A-V fistula ([place]) 11/28/2019   Current smoker 11/28/2019   History of intracranial hemorrhage 11/28/2019   Transplanted organ and tissue status, 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/15/2019   Intracerebral hemorrhage 02/21/2019   Intracranial hematoma ([place]) 02/20/2019   Seizure disorder ([place]) 02/19/2019   Pain, unspecified 08/23/2018   SOB (shortness of breath) 07/15/2018   Pruritus, unspecified 10/28/2017   Headache, unspecified 09/26/2017   Anemia of chronic disease 06/30/2017   Coagulation defect, unspecified ([place]) 06/30/2017   Hypertensive  chronic kidney disease with stage 1 through stage 4 chronic kidney disease, or unspecified chronic kidney disease 06/30/2017   Iron deficiency anemia, unspecified 06/30/2017   Encounter for fitting and adjustment of extracorporeal dialysis catheter ([place]) 06/30/2017   Secondary  hyperparathyroidism of renal origin ([place]) 06/30/2017   ESRD (end stage renal disease) ([place]) 06/24/2017   Hypertension 03/23/2017   Anemia 02/12/2017   Hypokalemia 02/12/2017   Hyponatremia 02/12/2017   PCP:  Sonia Side., FNP Pharmacy:   [place] [place] - [address] [place]) [place] [address] Phone: [phone] Fax: [place] [address] [place] [address] Phone: [phone] Fax: [phone]     Social Determinants of Health (SDOH) Interventions    Readmission Risk Interventions     No data to display

## 2022-02-04 NOTE — Progress Notes (Signed)
[facility] Progress Note   Subjective:   2.8L UF with HD yesterday, says she had some cramping  at end of HD. Denies SOB, CP, dizziness and nausea today. On RA. CXR improved after dialysis.   Objective Vitals:   02/03/22 1730 02/03/22 2126 02/04/22 0445 02/04/22 0958  BP: 123/88 101/67 110/77 123/84  Pulse: 87 83 79 81  Resp: '17 18 18 18  '$ Temp: 98.1 F (36.7 C) 98.5 F (36.9 C)  98.5 F (36.9 C)  TempSrc:    Oral  SpO2: 98% 94% 97% 98%  Weight:      Height:       Physical Exam General: Well appearing female, alert and in NAD Heart: RRR, no murmurs, rubs or gallops Lungs: CTA bilaterally without wheezing, rhonchi or rales Abdomen: Soft, non-tender, non-distended, +BS Extremities: No edema b/l lower extremities Dialysis Access: LUE AVF + bruit  Additional Objective Labs: Basic Metabolic Panel: Recent Labs  Lab 02/02/22 1144 02/03/22 0321 02/04/22 0446  NA 137 133* 137  K 3.5 4.4 4.1  CL 92* 93* 95*  CO2 32 30 28  GLUCOSE 102* 139* 105*  BUN 46* 69* 63*  CREATININE 7.87* 9.89* 7.31*  CALCIUM 9.6 9.7 9.4  PHOS  --   --  6.5*   Liver Function Tests: Recent Labs  Lab 02/02/22 1144 02/04/22 0446  AST 15  --   ALT 10  --   ALKPHOS 35*  --   BILITOT 1.3*  --   PROT 7.0  --   ALBUMIN 3.4* 3.1*   No results for input(s): "LIPASE", "AMYLASE" in the last 168 hours. CBC: Recent Labs  Lab 02/02/22 1144 02/03/22 0321 02/04/22 0446  WBC 4.4 5.1 7.0  NEUTROABS 3.4  --   --   HGB 9.1* 8.2* 8.2*  HCT 28.4* 24.8* 25.4*  MCV 102.5* 100.4* 100.0  PLT 151 141* 150   Blood Culture    Component Value Date/Time   SDES BLOOD RIGHT WRIST 08/07/2020 0044   SPECREQUEST  08/07/2020 0044    BOTTLES DRAWN AEROBIC AND ANAEROBIC Blood Culture adequate volume   CULT  08/07/2020 0044    NO GROWTH 5 DAYS Performed at [facility], [place] [address], [place], [place] [address]    REPTSTATUS 08/12/2020 FINAL 08/07/2020 0044    Cardiac Enzymes: No  results for input(s): "CKTOTAL", "CKMB", "CKMBINDEX", "TROPONINI" in the last 168 hours. CBG: No results for input(s): "GLUCAP" in the last 168 hours. Iron Studies: No results for input(s): "IRON", "TIBC", "TRANSFERRIN", "FERRITIN" in the last 72 hours. '@lablastinr3'$ @ Studies/Results: DG Chest 2 View  Result Date: 02/04/2022 CLINICAL DATA:  Shortness of breath EXAM: CHEST - 2 VIEW COMPARISON:  Previous studies including the examination of 02/02/2022 FINDINGS: Transverse diameter of heart is increased. There is interval decrease in pulmonary vascular congestion. There is interval clearing of alveolar densities in right mid and right lower lung fields suggesting resolution of asymmetric pulmonary edema. There are no new focal infiltrates. Small linear densities in medial lower lung fields suggest subsegmental atelectasis. There is no significant pleural effusion or pneumothorax. IMPRESSION: Cardiomegaly. There is interval decrease in pulmonary vascular congestion. There is interval clearing of alveolar densities in right mid and right lower lung fields suggesting interval resolution of asymmetric pulmonary edema. Small linear densities in the medial lower lung fields suggest subsegmental atelectasis. Electronically Signed   By: Elmer Picker M.D.   On: 02/04/2022 09:35   CT Angio Chest PE W and/or Wo Contrast  Result  Date: 02/02/2022 CLINICAL DATA:  Shortness of breath. EXAM: CT ANGIOGRAPHY CHEST WITH CONTRAST TECHNIQUE: Multidetector CT imaging of the chest was performed using the standard protocol during bolus administration of intravenous contrast. Multiplanar CT image reconstructions and MIPs were obtained to evaluate the vascular anatomy. RADIATION DOSE REDUCTION: This exam was performed according to the departmental dose-optimization program which includes automated exposure control, adjustment of the mA and/or kV according to patient size and/or use of iterative reconstruction technique.  CONTRAST:  45m OMNIPAQUE IOHEXOL 350 MG/ML SOLN COMPARISON:  12/07/2019 FINDINGS: Cardiovascular: Satisfactory opacification of the pulmonary arteries to the segmental level. No evidence of pulmonary embolism. Stable cardiomegaly. Coronary artery atherosclerosis. Mild thoracic aortic atherosclerosis. No pericardial effusion. Mediastinum/Nodes: No enlarged mediastinal, hilar, or axillary lymph nodes. Thyroid gland, trachea, and esophagus demonstrate no significant findings. Lungs/Pleura: Bilateral reticulonodular interstitial disease with patchy ground-glass opacities. Bilateral lower lobe dependent airspace disease likely reflecting atelectasis. No pleural effusion or pneumothorax. Upper Abdomen: No acute abnormality. Musculoskeletal: No chest wall abnormality. No acute or significant osseous findings. Review of the MIP images confirms the above findings. IMPRESSION: 1. No pulmonary embolism. 2. Cardiomegaly with bilateral interstitial thickening and alveolar airspace opacities concerning for CHF versus less likely atypical viral pneumonia. 3. Bilateral lower lobe dependent airspace disease likely reflecting atelectasis. 4. Aortic Atherosclerosis (ICD10-I70.0). Coronary artery atherosclerosis. Electronically Signed   By: HKathreen DevoidM.D.   On: 02/02/2022 15:18   Medications:  cefTRIAXone (ROCEPHIN)  IV 1 g (02/03/22 1617)    amLODipine  10 mg Oral QHS   azithromycin  250 mg Oral Daily   [START ON 02/05/2022] carvedilol  25 mg Oral 2 times per day on Sun Mon Wed Fri   And   carvedilol  25 mg Oral Once per day on Tue Thu Sat   Chlorhexidine Gluconate Cloth  6 each Topical Q0600   Chlorhexidine Gluconate Cloth  6 each Topical Q0600   guaiFENesin  600 mg Oral BID   hydrALAZINE  100 mg Oral TID   levETIRAcetam  1,000 mg Oral Daily   multivitamin  1 tablet Oral QHS   sevelamer carbonate  1,600 mg Oral TID WC   spironolactone  50 mg Oral Daily    Dialysis Orders: [facility] 3h 441m 350/1.5   51.5kg   2/2 bath  Hep none  LUA AVF - last hep B labs: 12/31/21, +immune - mircera 100 q 2, last 8/15, due 8/29 - last HD 8/19, post wt 51.0kg, has been coming off very close to dry wt - just completed IV Fe load on 8/19  Assessment/Plan: SOB - CT showing combination of GG changes suggestive of edema, and posterior consolidation c/w PNA.  Started on IV abx per primary team. Extra HD Monday for volume removal with significant improvement in symptoms and chest x-ray. Was most likely volume overloaded, plan to decrease her EDW at discharge.  ESRD - on HD TTS.  Missed 1 HD last week. Extra HD Monday, resume TTS schedule today HTN/ vol - BP controlled, continue home meds. Not grossly edematous on exam, but imaging suggesting some combination of pulm edema and infection so plan is as above Anemia esrd - Hb 8.2 here, next esa is due on 8/29 as above. Just completed IV Fe load at OP center. Follow.  MBD ckd - CCa in range, phos slightly elevated. Cont renvela as binder. Not on vdra.  SaAnice PaganiniPA-C 02/04/2022, 10:55 AM  [facility] Pager: [phone]

## 2022-02-04 NOTE — Discharge Instructions (Signed)
Dear [name],  Thank you for letting us participate in your care. You were hospitalized for worsening respiratory status and diagnosed with acute pulmonary edema and respiratory failure. You were treated with IV antibiotics and we continued your Hemodialysis.   POST-HOSPITAL & CARE INSTRUCTIONS Antibiotics: you will be discharged home on one antibiotic. Continue taking this until the pills run out.  Breathing difficulties: you will need to follow up pulmonary outpatient and will need to have a repeat CT scan of your chest in 3 months. If you feel short of breath at home you are able to use your home oxygen.  Continue your great work of not smoking. If you need help quitting please let us know.  Continue your dialysis on scheduled days.  We did not make any changes to your outpatient medications. Continue everything you've been doing at home.  Go to your follow up appointments (listed below)   DOCTOR'S APPOINTMENT   Future Appointments  Date Time Provider [place]  02/05/2022  9:30 AM Marylu Lund., NP [facility]     Take care and be well!  [facility]  [place], [place] [address] [phone]

## 2022-02-04 NOTE — Plan of Care (Signed)
Successfully wean off from oxygen. 90% r.a sat.Asymptomatic .Will implement IS.

## 2022-02-04 NOTE — Discharge Summary (Addendum)
[facility] Discharge Summary  Patient name: Madeline Brown Medical record number: [id number] Date of birth: 09/03/1975 Age: 46 y.o. Gender: female Date of Admission: 02/02/2022  Date of Discharge: 02/04/22  Admitting Physician: Darci Current, DO  Primary Care Provider: Sonia Side., FNP Consultants: Pulmonology, Nephrology   Indication for Hospitalization: Acute respiratory failure 2/2 to acute on chronic CHF.   Brief Hospital Course:  Madeline Brown is a 46 y.o. female presenting to the ED with 45 days of shortness of breath requiring 2 L of oxygen at home.  Past medical history significant for longstanding hypertension, ESRD on hemodialysis Tuesday Thursday Saturday, history of seizures, history of intracranial hemorrhage, and HFpEF.  Her hospital course is outlined below:  Acute Hypoxemic respiratory failure  Acute Pulmonary Edema:  Patient presented to the ED with increased O2 requirement and SOB ongoing 3-4 days. CXR showed progression of right lung consolidation previously seen March/2023. CT chest ruled out PE and showed bilateral alveolar airspaces opacities concerning for CHF, and bilateral atelectasis. Her respiratory status improved after HD and she was weaned to room air prior to  discharge. Respiratory failure was thought to be multifactorial with potential contributing factors including lower airway infection and volume overload in the setting of missed HD. Pulmonology consulted and recommended follow up chest CT in 6-8 weeks.  She was discharged with instructions to complete a course of oral antibiotics for pneumonia.   Acute on Chronic diastolic CHF Patient on admission was under her dry weight. She had no signs of volume overload on exam or indicated by labs. Continued her home medications.   ESRD Normally gets dialysis Tuesday, Thursday, Saturday.  She had missed a dialysis session in the week leading up to admission which was thought to  contribute to her presentation. She received an off-schedule dialysis session on hospital day 2.  Dialysis was continued during admission per nephrology.   Discharge Diagnoses/Problem List:  Principal Problem for Admission: Acute respiratory failure secondary to acute on chronic CHF Other Problems addressed during stay:  ESRD, dialysis continued Acute on chronic diastolic CHF  Disposition: Home  Discharge Condition: Stable  Discharge Exam:  Vitals:   02/04/22 0445 02/04/22 0958  BP: 110/77 123/84  Pulse: 79 81  Resp: 18 18  Temp:  98.5 F (36.9 C)  SpO2: 97% 98%   Gen: Chronically ill-appearing, no acute distress Cardio: Regular rate, regular rhythm, no murmurs appreciated on exam Pulm: Clear with minimal crackles at lung bases bilaterally.  No increased work of breathing Abdominal: Soft and nontender Extremities: No peripheral edema  Issues for Follow Up:  Repeat chest CT in 3 months per pulm. Pulmonology also plans to follow-up with patient as an outpatient due to concern for possible pulmonary hypertension on prior echocardiograms. Unfortunately, tricuspid regurgitation signal was inadequate for assessing PA pressure on echo this admission.  Follow-up with smoking cessation efforts. Patient currently only smokes 3-4 cigarettes/day. We are hopeful that cessation is possible as she expresses motivation.   Significant Procedures: None  Significant Labs and Imaging:  Recent Labs  Lab 02/03/22 0321 02/04/22 0446  WBC 5.1 7.0  HGB 8.2* 8.2*  HCT 24.8* 25.4*  PLT 141* 150   Recent Labs  Lab 02/03/22 0321 02/03/22 0914 02/04/22 0446  NA 133*  --  137  K 4.4  --  4.1  CL 93*  --  95*  CO2 30  --  28  GLUCOSE 139*  --  105*  BUN 69*  --  63*  CREATININE 9.89*  --  7.31*  CALCIUM 9.7  --  9.4  MG  --  2.7* 2.3  PHOS  --   --  6.5*  ALBUMIN  --   --  3.1*    CXR: 02/02/2022 -Persistent consolidative opacity in the right mid and lower lung progressive since  08/18/2021  CTA with contrast: 02/02/2022 -No pulmonary embolism. -Cardiomegaly with bilateral interstitial thickening and alveolar airspace opacities concerning for CHF - Bilateral lower lobe dependent airspace disease is likely atelectasis  CXR: 02/04/2022 -Cardiomegaly - Interval disease and pulmonary vascular congestion - Interval clearing of alveolar densities in right mid and lower lung field suggestion of interval resolution of asymmetric pulmonary edema - Small linear densities in the medial lower lung fields chest tube subsegmental atelectasis  Echo: 02/04/2022 -LVEF 60 to 65% - No wall motion abnormalities - Severe left ventricular hypertrophy, unchanged from last echo - Tricuspid regurgitation signal is an adequate for assessing PA pressure  Results/Tests Pending at Time of Discharge: none   Discharge Medications:  Allergies as of 02/04/2022       Reactions   Visine [tetrahydrozoline Hcl] Swelling, Other (See Comments)   Eyes Swell        Medication List     TAKE these medications    acetaminophen 500 MG tablet Commonly known as: TYLENOL Take 500-1,000 mg by mouth every 6 (six) hours as needed for mild pain or headache.   amLODipine 10 MG tablet Commonly known as: NORVASC Take 1 tablet (10 mg total) by mouth at bedtime.   azithromycin 250 MG tablet Commonly known as: ZITHROMAX Take 1 tablet (250 mg total) by mouth daily for 3 days.   carvedilol 25 MG tablet Commonly known as: COREG Take 1 tablet (25 mg total) by mouth 2 (two) times daily with a meal. What changed:  when to take this additional instructions   DIALYVITE PO Take 1 tablet by mouth daily with breakfast.   hydrALAZINE 100 MG tablet Commonly known as: APRESOLINE Take 1 tablet (100 mg total) by mouth 3 (three) times daily.   levETIRAcetam 1000 MG tablet Commonly known as: KEPPRA Take 1 tablet (1,000 mg total) by mouth 2 (two) times daily. What changed: when to take this   sevelamer  carbonate 800 MG tablet Commonly known as: RENVELA Take 2 tablets (1,600 mg total) by mouth 3 (three) times daily with meals. What changed:  how much to take when to take this additional instructions   spironolactone 50 MG tablet Commonly known as: ALDACTONE Take 1 tablet (50 mg total) by mouth daily.        Discharge Instructions: Please refer to Patient Instructions section of EMR for full details.  Patient was counseled important signs and symptoms that should prompt return to medical care, changes in medications, dietary instructions, activity restrictions, and follow up appointments.   Follow-Up Appointments: 02/05/2022 '@9'$ :73 AM [place] office  Darci Current, [place] 02/04/2022, 2:26 PM PGY-1, [facility]    I have evaluated this patient along with Dr. Sabra Heck and reviewed the above note, making necessary revisions.  Pearla Dubonnet, MD 02/04/2022, 3:17 PM PGY-2, [place]

## 2022-02-04 NOTE — Progress Notes (Signed)
   NAME:  Madeline Brown, MRN:  [id number], DOB:  11-25-1975, LOS: 2 ADMISSION DATE:  02/02/2022, CONSULTATION DATE:  8/21 REFERRING MD:  [facility], CHIEF COMPLAINT:  Dyspnea   History of Present Illness:  46 y/o female smoker on HD for ESRD and multiple other medical problems presented on 8/20 with dyspnea, new oxygen requirement, new consolidation RML and RLL.  PCCM consulted for further management.   Pertinent  Medical History  ESRD on HD Hypertension ICH LVH Pulmonary hypertension Seizure disorder [place] smoker, > 1/2 ppd since age 68  Significant Hospital Events: Including procedures, antibiotic start and stop dates in addition to other pertinent events   8/20 CT angiogram chest > upper lobe predominant centrilobular ggo, interlobular septal thickening and bibasilar atelectasis R>L  Interim History / Subjective:  Feels better Had fluid removed with HD last night: 2.8 L, today 2.0 liters planned Off oxygen for 2 hours  Objective   Blood pressure 110/77, pulse 79, temperature 98.5 F (36.9 C), resp. rate 18, height 5' (1.524 m), weight 50.7 kg, last menstrual period 12/14/2017, SpO2 97 %, unknown if currently breastfeeding.    FiO2 (%):  [97 %] 97 %   Intake/Output Summary (Last 24 hours) at 02/04/2022 3154 Last data filed at 02/03/2022 2126 Gross per 24 hour  Intake 100 ml  Output 2.8 ml  Net 97.2 ml   Filed Weights   02/03/22 0819 02/03/22 1205 02/03/22 1320  Weight: 52.7 kg 50.6 kg 50.7 kg    Examination: General:  walking around room on room air HENT: NCAT OP clear PULM: CTA B, normal effort CV: RRR, no mgr GI: BS+, soft, nontender MSK: normal bulk and tone Neuro: awake, alert, no distress, MAEW  8/22 CXR personally reviewed> infiltrates resolved   Resolved Hospital Problem list    Assessment & Plan:  Acute hypoxemic respiratory failure  Acute pulmonary edema ESRD Cigarette smoker Pulmonary hypertension > 10/22 TTE> LVEF 60-65%, LVH,  LAE, RVSP 37, RA dilated, Mitral valve normal Bilateral pulmonary atelectasis  Discussion: Infiltrates have resolved with volume removal so most likely all we are dealing with is pulmonary edema  Plan: Encouraged to quit smoking Would plan on repeating CT chest in 3 months to ensure the atelectasis has resolved compared to the admission CT chest as she is high risk for malignancy given her smoking history. HD per renal Will arrange outpatient f/u with pulmonary to ensure infiltrates have resolved  PCCM will sign off, call if questions Best Practice (right click and "Reselect all SmartList Selections" daily)   Per primary   Roselie Awkward, MD [place] [facility] Pager: [phone] Cell: [phone] After 7:00 pm call [place]  [phone]

## 2022-02-04 NOTE — Progress Notes (Signed)
Daily Progress Note Intern Pager: [phone]  Patient name: Madeline Brown Medical record number: [id number] Date of birth: [birth year] Age: 46 y.o. Gender: female  Primary Care Provider: Sonia Side., FNP Consultants: Nephrology, pulmonology Code Status: Full code  Pt Overview and Major Events to Date:  -Admitted to [facility] 8/21  Assessment and Plan:  Madeline Brown is a 46 y.o. female presenting with SOB requiring 4 L of O2 and right chronic lung consolidation.  Past medical history is significant for longstanding hypertension, ESRD on hemodialysis, history of seizures on Keppra, history of intracranial hemorrhage, HFpEF.  Patient stable for discharge after dialysis today.   Acute respiratory failure with hypoxia (HCC) O2 stable on 4 L O2.  Chest x-ray showed a chronic and progressive right middle/lower lung field consolidation concerning for pneumonia.  Previous x-ray in March/2023 showed smaller right lung consolidation.  Reassured by lack of fever and WBC WNL.  Moderate smoking history, 3 to 4 cigarettes/day. - Consult to pulm, appreciate recommendations - Pulmonary prescribed guaifenesin. - Repeat chest x-ray pending. - Continue O2, wean as tolerated - Continue azithromycin (8/20 - 8/24) and ceftriaxone (8/20 - 8/22)  ESRD (end stage renal disease) ([place]) She normally gets dialysis Tuesday, Thursday, Saturday.  She missed her dialysis on Thursday but was able to get it yesterday.  No evidence of volume overload, as above. - Nephrology consulted, appreciate recommendations - Continue dialysis schedule while inpatient.  Anticipate maintaining her normal schedule. - Continue Dialyvite tablets and Renvela with meals  Acute on chronic diastolic CHF (congestive heart failure) (HCC) Echo 6 months ago had a normal EF, with severe concentric left ventricular hypertrophy with grade 2 diastolic dysfunction.  BNP today lower than during her recent admissions. -Consider repeating echo  outpatient   Hypertension BP well controlled on home blood pressure medications.  Patient reports good adherence to home meds. -Continue home Coreg, amlodipine, hydralazine  Anemia of chronic disease Hemoglobin stable at 8.2 this morning.  Appears to be around her baseline.  - Transfuse <7   Hyponatremia Sodium corrected after dialysis   FEN/GI: Renal diet PPx: Mechanical prophylaxis due to Padua score 1 Dispo:Home . Barriers include respiratory status improvement.   Subjective:  Patient resting comfortably this morning.  No acute events overnight.  She has no new concerns.  She says her breathing has improved.  Objective: Temp:  [97.9 F (36.6 C)-98.5 F (36.9 C)] 98.5 F (36.9 C) (08/22 0958) Pulse Rate:  [79-90] 81 (08/22 0958) Resp:  [17-20] 18 (08/22 0958) BP: (101-129)/(67-91) 123/84 (08/22 0958) SpO2:  [94 %-98 %] 98 % (08/22 0958) Weight:  [50.6 kg-50.7 kg] 50.7 kg (08/21 1320) Physical Exam: General: Chronically ill-appearing, no acute distress Cardiovascular: Regular rate and rhythm, murmur appreciated Respiratory: No increased work of breathing, lung sounds clear, minimal decreased lung sounds at the bases of lungs, improved from yesterdays exam Abdomen: Nontender, nondistended Extremities: Peripheral edema  Laboratory: Most recent CBC Lab Results  Component Value Date   WBC 7.0 02/04/2022   HGB 8.2 (L) 02/04/2022   HCT 25.4 (L) 02/04/2022   MCV 100.0 02/04/2022   PLT 150 02/04/2022   Most recent BMP    Latest Ref Rng & Units 02/04/2022    4:46 AM  BMP  Glucose 70 - 99 mg/dL 105   BUN 6 - 20 mg/dL 63   Creatinine 0.44 - 1.00 mg/dL 7.31   Sodium 135 - 145 mmol/L 137   Potassium 3.5 - 5.1 mmol/L 4.1  Chloride 98 - 111 mmol/L 95   CO2 22 - 32 mmol/L 28   Calcium 8.9 - 10.3 mg/dL 9.4    Imaging/Diagnostic Tests: No new test.  Darci Current, DO 02/04/2022, 11:07 AM  PGY-1, [place] Intern pager: [phone], text pages  welcome Secure chat group [place]

## 2022-02-04 NOTE — Progress Notes (Signed)
Received patient in bed to unit.  Alert and oriented.  Informed consent signed and in chart.   Treatment initiated: 1615 Treatment completed: 1915  Patient tolerated well.  Transported back to the room  Alert, without acute distress.  Hand-off given to patient's nurse.   Access used: AVF Access issues: none  Total UF removed: 2500 ml Medication(s) given: none Post HD VS: 114/82 p 84 R 20 Post HD weight: 49.9kg   [facility]

## 2022-02-04 NOTE — Telephone Encounter (Signed)
Please arrange hospital follow up for pulmonary infiltrates, cigarette smoker with Daundre Biel in 3 months

## 2022-02-04 NOTE — Progress Notes (Signed)
Patient was given AVS paperwork, educated and denies having any questions concerning discharge. Patient stated she has received prescription from RN on previous shift for Azithromycin. Patient IV was removed and patient was escorted to the discharge area and taken home by a family friend. Patient stated she did not want to take any of her night time medications, even after been offered to take them before discharge. Patient stated she will take them when she gets home instead.

## 2022-02-05 ENCOUNTER — Ambulatory Visit: Payer: Medicare HMO | Admitting: Nurse Practitioner

## 2022-02-05 ENCOUNTER — Telehealth: Payer: Self-pay | Admitting: Physician Assistant

## 2022-02-05 NOTE — Telephone Encounter (Signed)
Transition of care contact from inpatient facility  Date of discharge: 02/04/22 Date of contact: 02/05/22 Method: Phone Spoke to: Patient  Patient contacted to discuss transition of care from recent inpatient hospitalization. Patient was admitted to [facility] with discharge diagnosis of pulmonary edema.   Medication changes were reviewed. Requests refill of spironolactone, will send rx.  Patient will follow up with his/her outpatient HD unit on: 02/06/22  Anice Paganini, PA-C 02/05/2022, 1:12 PM  [facility] Pager: [phone].

## 2022-02-05 NOTE — Progress Notes (Deleted)
Office Visit    Patient Name: Madeline Brown Date of Encounter: 02/05/2022  Primary Care Provider:  Sonia Side., FNP Primary Cardiologist:  Jenkins Rouge, MD Primary Electrophysiologist: None  Chief Complaint    Madeline Brown is a 46 y.o. female with PMH of HTN, CVA, ESRD on HD, anemia, tobacco abuse, pulmonary HTN, COPD, who presents today for evaluation of hypertension.  Past Medical History    Past Medical History:  Diagnosis Date   Anemia    ESRD on HD    Headache, unspecified 09/26/2017   History of blood transfusion 07/2020   Hypertension    Intracranial hemorrhage (HCC)    LVH (left ventricular hypertrophy)    Pericardial effusion    Pulmonary hypertension ([place])    Seizure ([place]) 02/2019   Shortness of breath 07/15/2018   01/16/21 not currently   Stroke [facility])    Mild was told after having a seizure   Thrombocytopenia ([place])    Past Surgical History:  Procedure Laterality Date   AV FISTULA PLACEMENT Left 06/24/2017   Procedure: CREATION OF LEFT ARM BRACHIALCEPHALIC  ARTERIOVENOUS (AV) FISTULA;  Surgeon: Rosetta Posner, MD;  Location: [place];  Service: Vascular;  Laterality: Left;   CESAREAN SECTION     x2   COMPLEX WOUND CLOSURE Left 07/30/2020   Procedure: COMPLEX WOUND CLOSURE;  Surgeon: Cherre Robins, MD;  Location: [place];  Service: Vascular;  Laterality: Left;   FISTULA SUPERFICIALIZATION Left 10/28/2017   Procedure: SUPERFICIALIZATION LEFT BRACHIOCEPHALIC ARTERIOVENOUS FISTULA;  Surgeon: Rosetta Posner, MD;  Location: [place];  Service: Vascular;  Laterality: Left;   INGUINAL HERNIA REPAIR Bilateral 02/01/2021   Procedure: LAPAROSCOPIC BILATERAL INGUINAL HERNIA REPAIR WITH MESH;  Surgeon: Kinsinger, Arta Bruce, MD;  Location: [facility];  Service: General;  Laterality: Bilateral;   INSERTION OF DIALYSIS CATHETER Right 06/24/2017   Procedure: INSERTION OF TUNNELED DIALYSIS CATHETER;  Surgeon: Rosetta Posner, MD;  Location: [place];  Service: Vascular;  Laterality:  Right;   INSERTION OF DIALYSIS CATHETER Right 07/30/2020   Procedure: INSERTION OF DIALYSIS CATHETER;  Surgeon: Cherre Robins, MD;  Location: [place];  Service: Vascular;  Laterality: Right;   LIGATION OF COMPETING BRANCHES OF ARTERIOVENOUS FISTULA  10/28/2017   Procedure: LIGATION OF COMPETING BRANCHES OF ARTERIOVENOUS FISTULA x3;  Surgeon: Rosetta Posner, MD;  Location: [place];  Service: Vascular;;   REVISON OF ARTERIOVENOUS FISTULA Left 07/30/2020   Procedure: LEFT UPPER EXTREMITY ARTERIOVENOUS FISTULA REVISON;  Surgeon: Cherre Robins, MD;  Location: [facility];  Service: Vascular;  Laterality: Left;  PERIPHERAL NERVE BLOCK    Allergies  Allergies  Allergen Reactions   Visine [Tetrahydrozoline Hcl] Swelling and Other (See Comments)    Eyes Swell    History of Present Illness    Madeline Brown is a 46 year old female with the above-mentioned past medical history who presents today for posthospital follow-up.  She was initially seen in 2018 for abnormal troponins and chest pain during hospitalization for AKI.  She did describe some radiating epigastric pain that was alleviated with sitting and supine position.  She had mild dyspnea that lasted for a few moments.  EKG was noted to be sinus tach with no acute changes.  She was noted to be hypertensive and patient was transferred to [facility] for further management.  2D echo was completed showing small to moderate pericardial effusion with LVH noted, EF of 50-55%.  She was evaluated by cardiology who felt that effusion was caused  by renal failure.  She was seen in follow-up and 2D echo was repeated withEF 60-65% severe LVH,Estimated PA pressure 54 mmHg small to moderate circumferential effusion no tamponade.   She was hospitalized on 11/2019 with HTN emergency ICH and seizures BP meds adjusted and started on Keppra She was non compliant with her meds and presented with pulmonary edema.  She was dialyzed and hydralazine was increased to 100 mg. Repeat MRI  without significant interval change, no acute hemorrhage and patient was advised to continue Keppra.  She was admitted 08/06/2020 following revision to fistula.  She was found to have volume overload and low-grade fever with tachycardia.  She was dialyzed and treated for acute respiratory failure with hypoxia.  She was then seen on 03/2021 in the ED for dyspnea and chest pain.  She was found to be fluid overloaded and patient improved with dialysis and discharged.  2D echo was completed with EF of 60 to 65%, LVH, and mild elevated PA systolic pressure at 70.0 mmHg, mildly dilated RA and mildly dilated LA with no valvular abnormalities.  She was most recently seen in the ED on 12/2021 for shortness of breath that occurs with lying flat.  She presented via EMS with sats of 80%.  Patient did admit to sodium indiscretion with pizza earlier that day.  ECG with sinus tach and first-degree AV block with LAE.  She was admitted on 01/2022 with continued shortness of breath and was found to have exacerbation of COPD treated with antibiotics.  Patient had missed HD on Thursday and was dialyzed on 8/20.   Since last being seen in the office patient reports***.  Patient denies chest pain, palpitations, dyspnea, PND, orthopnea, nausea, vomiting, dizziness, syncope, edema, weight gain, or early satiety.         ***Notes: She was last seen January 2022 -Last 2D echo was 01/2022 with no significant change from previous echo -Recent admission for shortness of breath  Home Medications    Current Outpatient Medications  Medication Sig Dispense Refill   acetaminophen (TYLENOL) 500 MG tablet Take 500-1,000 mg by mouth every 6 (six) hours as needed for mild pain or headache.     amLODipine (NORVASC) 10 MG tablet Take 1 tablet (10 mg total) by mouth at bedtime. 30 tablet 0   azithromycin (ZITHROMAX) 250 MG tablet Take 1 tablet (250 mg total) by mouth daily for 3 days. 3 tablet 0   B Complex-C-Folic Acid (DIALYVITE PO) Take 1  tablet by mouth daily with breakfast.     carvedilol (COREG) 25 MG tablet Take 1 tablet (25 mg total) by mouth 2 (two) times daily with a meal. (Patient taking differently: Take 25 mg by mouth See admin instructions. Take 25 mg by mouth at 6:30 AM and 6:00 PM on Sun/Mon/Wed/Fri and 25 mg at 11:00 AM and 8:00 PM on Tues/Thurs/Sat) 60 tablet 0   hydrALAZINE (APRESOLINE) 100 MG tablet Take 1 tablet (100 mg total) by mouth 3 (three) times daily. 90 tablet 0   levETIRAcetam (KEPPRA) 1000 MG tablet Take 1 tablet (1,000 mg total) by mouth 2 (two) times daily. (Patient taking differently: Take 1,000 mg by mouth in the morning.) 60 tablet 1   sevelamer carbonate (RENVELA) 800 MG tablet Take 2 tablets (1,600 mg total) by mouth 3 (three) times daily with meals. (Patient taking differently: Take 800-2,400 mg by mouth See admin instructions. Take 1,600-2,400 mg by mouth three times a day with meals and 800 mg with each snack) 30 tablet  0   spironolactone (ALDACTONE) 50 MG tablet Take 1 tablet (50 mg total) by mouth daily. 30 tablet 0   No current facility-administered medications for this visit.     Review of Systems  Please see the history of present illness.    (+)*** (+)***  All other systems reviewed and are otherwise negative except as noted above.  Physical Exam    Wt Readings from Last 3 Encounters:  02/04/22 110 lb 0.2 oz (49.9 kg)  12/31/21 113 lb 12.1 oz (51.6 kg)  08/19/21 112 lb 14 oz (51.2 kg)   SJ:GGEZM were no vitals filed for this visit.,There is no height or weight on file to calculate BMI.  Constitutional:      Appearance: Healthy appearance. Not in distress.  Neck:     Vascular: JVD normal.  Pulmonary:     Effort: Pulmonary effort is normal.     Breath sounds: No wheezing. No rales. Diminished in the bases Cardiovascular:     Normal rate. Regular rhythm. Normal S1. Normal S2.      Murmurs: There is no murmur.  Edema:    Peripheral edema absent.  Abdominal:     Palpations:  Abdomen is soft non tender. There is no hepatomegaly.  Skin:    General: Skin is warm and dry.  Neurological:     General: No focal deficit present.     Mental Status: Alert and oriented to person, place and time.     Cranial Nerves: Cranial nerves are intact.  EKG/LABS/Other Studies Reviewed    ECG personally reviewed by me today - ***  Risk Assessment/Calculations:   {Does this patient have ATRIAL FIBRILLATION?:[phone]}        Lab Results  Component Value Date   WBC 7.0 02/04/2022   HGB 8.2 (L) 02/04/2022   HCT 25.4 (L) 02/04/2022   MCV 100.0 02/04/2022   PLT 150 02/04/2022   Lab Results  Component Value Date   CREATININE 7.31 (H) 02/04/2022   BUN 63 (H) 02/04/2022   NA 137 02/04/2022   K 4.1 02/04/2022   CL 95 (L) 02/04/2022   CO2 28 02/04/2022   Lab Results  Component Value Date   ALT 10 02/02/2022   AST 15 02/02/2022   ALKPHOS 35 (L) 02/02/2022   BILITOT 1.3 (H) 02/02/2022   Lab Results  Component Value Date   CHOL 172 03/23/2019   HDL 93 03/23/2019   LDLCALC 68 03/23/2019   TRIG 55 03/23/2019   CHOLHDL 1.8 03/23/2019    Lab Results  Component Value Date   HGBA1C 4.3 (L) 02/21/2019    Assessment & Plan    1.  HFpEF/pulmonary HTN: -2D echo completed 01/2022 with EF of 60-65% no RWMA, moderately dilated LA/RA, PA pressure of 54 -Continue amlodipine 10 mg, carvedilol 25 mg twice daily, spironolactone 50 mg -Volume managed by dialysis   2.  HTN: -Blood pressure today was*** -Continue hydralazine 100 mg 3 times daily, carvedilol 25 mg twice daily, Norvasc 10 mg, and spironolactone 50 mg   3.  ESRD: - ESRD on hemodialysis Tuesday Thursday Saturday continue per nephrology -Patient recently missed dialysis session and was admitted with volume overload on 02/02/2022     4.  History of ICH: -Blood pressure today was*** -   5.  Tobacco abuse: -Patient reports***        Disposition: Follow-up with Jenkins Rouge, MD or APP in *** months {Are  you ordering a CV Procedure (e.g. stress test, cath, DCCV, TEE, etc)?  Press F2        :[id number]}   Medication Adjustments/Labs and Tests Ordered: Current medicines are reviewed at length with the patient today.  Concerns regarding medicines are outlined above.   Signed, Mable Fill, Marissa Nestle, NP 02/05/2022, 12:58 PM [place]

## 2022-02-05 NOTE — Progress Notes (Signed)
Late Entry Note: Pt was d/c to home last evening .Contacted [facility] to advise clinic of pt's d/c and that pt should resume care on Thursday.   Melven Sartorius Renal Navigator [phone]

## 2022-02-07 ENCOUNTER — Ambulatory Visit: Payer: Medicare HMO | Admitting: Nurse Practitioner

## 2022-02-09 NOTE — Progress Notes (Deleted)
Cardiology Office Note:    Date:  02/09/2022   ID:  [name], DOB 12-15-1975, MRN [id number]  PCP:  Sonia Side., FNP   [facility] Providers Cardiologist:  Jenkins Rouge, MD { Click to update primary MD,subspecialty MD or APP then REFRESH:1}    Referring MD: Sonia Side., FNP   Chief Complaint: ***  History of Present Illness:    Madeline Brown is a *** 46 y.o. female with a hx of cardial effusion, ESRD on HD T/TH/SA, HTN, seizures, anemia, ICH, and tobacco abuse.   He was last seen in our office on 07/12/2020 by Dr. Johnsie Cancel.  Recent pericardial effusion, likely related to uremia had resolved on follow-up echocardiogram 12/08/2019.  He felt that her fistula seemed to large with shunting that caused continuous murmur.  He referred her to [facility] for another option for concern that the fistula would cause high output CHF.  She underwent revision of left upper extremity AV fistula secondary to enlargement and possible cardiac compromise on 07/26/2020.  Multiple admissions since that time.  History recent admission 8/20-8/22/23 for acute hypoxic respiratory failure, acute pulmonary edema. She presented with 4 days of SOB requiring 2L of oxygen at home.  XR showed progression of right lung consolidation previously seen March 2023.  CT chest ruled out PE and showed bilateral alveolar airspace opacities concerning for CHF and bilateral atelectasis.  Her respiratory status improved after HD.  Recommendation from pulmonology to repeat chest CT in 6 to 8 weeks and to complete a course of oral antibiotics for pneumonia.  At discharge, she was at her dry weight.   Past Medical History:  Diagnosis Date   Anemia    ESRD on HD    Headache, unspecified 09/26/2017   History of blood transfusion 07/2020   Hypertension    Intracranial hemorrhage (HCC)    LVH (left ventricular hypertrophy)    Pericardial effusion    Pulmonary hypertension ([place])    Seizure ([place]) 02/2019   Shortness of  breath 07/15/2018   01/16/21 not currently   Stroke [facility])    Mild was told after having a seizure   Thrombocytopenia ([place])     Past Surgical History:  Procedure Laterality Date   AV FISTULA PLACEMENT Left 06/24/2017   Procedure: CREATION OF LEFT ARM BRACHIALCEPHALIC  ARTERIOVENOUS (AV) FISTULA;  Surgeon: Rosetta Posner, MD;  Location: [place];  Service: Vascular;  Laterality: Left;   CESAREAN SECTION     x2   COMPLEX WOUND CLOSURE Left 07/30/2020   Procedure: COMPLEX WOUND CLOSURE;  Surgeon: Cherre Robins, MD;  Location: [place];  Service: Vascular;  Laterality: Left;   FISTULA SUPERFICIALIZATION Left 10/28/2017   Procedure: SUPERFICIALIZATION LEFT BRACHIOCEPHALIC ARTERIOVENOUS FISTULA;  Surgeon: Rosetta Posner, MD;  Location: [place];  Service: Vascular;  Laterality: Left;   INGUINAL HERNIA REPAIR Bilateral 02/01/2021   Procedure: LAPAROSCOPIC BILATERAL INGUINAL HERNIA REPAIR WITH MESH;  Surgeon: Kinsinger, Arta Bruce, MD;  [facility];  Service: General;  Laterality: Bilateral;   INSERTION OF DIALYSIS CATHETER Right 06/24/2017   Procedure: INSERTION OF TUNNELED DIALYSIS CATHETER;  Surgeon: Rosetta Posner, MD;  Location: [place];  Service: Vascular;  Laterality: Right;   INSERTION OF DIALYSIS CATHETER Right 07/30/2020   Procedure: INSERTION OF DIALYSIS CATHETER;  Surgeon: Cherre Robins, MD;  Location: [place];  Service: Vascular;  Laterality: Right;   LIGATION OF COMPETING BRANCHES OF ARTERIOVENOUS FISTULA  10/28/2017   Procedure: LIGATION OF COMPETING BRANCHES  OF ARTERIOVENOUS FISTULA x3;  Surgeon: Rosetta Posner, MD;  Location: [place];  Service: Vascular;;   REVISON OF ARTERIOVENOUS FISTULA Left 07/30/2020   Procedure: LEFT UPPER EXTREMITY ARTERIOVENOUS FISTULA REVISON;  Surgeon: Cherre Robins, MD;  Location: [place];  Service: Vascular;  Laterality: Left;  PERIPHERAL NERVE BLOCK    Current Medications: No outpatient medications have been marked as taking for the 02/12/22 encounter (Appointment)  with Ann Maki Lanice Schwab, NP.     Allergies:   Visine [tetrahydrozoline hcl]   Social History   Socioeconomic History   Marital status: Single    Spouse name: Not on file   Number of children: Not on file   Years of education: Not on file   Highest education level: Not on file  Occupational History   Not on file  Tobacco Use   Smoking status: Every Day    Packs/day: 0.50    Years: 27.00    Total pack years: 13.50    Types: Cigarettes    Start date: 30   Smokeless tobacco: Never  Vaping Use   Vaping Use: Never used  Substance and Sexual Activity   Alcohol use: Not Currently    Comment: occassional beer   Drug use: Yes    Types: Marijuana   Sexual activity: Yes    Birth control/protection: None  Other Topics Concern   Not on file  Social History Narrative   Not on file   Social Determinants of Health   Financial Resource Strain: Not on file  Food Insecurity: Not on file  Transportation Needs: Not on file  Physical Activity: Not on file  Stress: Not on file  Social Connections: Not on file     Family History: The patient's ***family history includes Aneurysm in her mother; Cancer in her father; Diabetes in her father; Heart disease in her father; Hypertension in her father; Seizures in her mother.  ROS:   Please see the history of present illness.    *** All other systems reviewed and are negative.  Labs/Other Studies Reviewed:    The following studies were reviewed today:  Echo 02/04/22  1. Left ventricular ejection fraction, by estimation, is 60 to 65%. The  left ventricle has normal function. The left ventricle has no regional  wall motion abnormalities. There is severe left ventricular hypertrophy.  Left ventricular diastolic parameters   are indeterminate.   2. Right ventricular systolic function is normal. The right ventricular  size is normal. Tricuspid regurgitation signal is inadequate for assessing  PA pressure.   3. Left atrial size was  moderately dilated.   4. Right atrial size was moderately dilated.   5. The mitral valve is normal in structure. No evidence of mitral valve  regurgitation.   6. The aortic valve was not well visualized. Aortic valve regurgitation  is not visualized.   7. The inferior vena cava is normal in size with greater than 50%  respiratory variability, suggesting right atrial pressure of 3 mmHg.   Comparison(s): No significant change from prior study.    Recent Labs: 02/02/2022: ALT 10; B Natriuretic Peptide 344.6 02/04/2022: BUN 63; Creatinine, Ser 7.31; Hemoglobin 8.2; Magnesium 2.3; Platelets 150; Potassium 4.1; Sodium 137  Recent Lipid Panel    Component Value Date/Time   CHOL 172 03/23/2019 0839   TRIG 55 03/23/2019 0839   HDL 93 03/23/2019 0839   CHOLHDL 1.8 03/23/2019 0839   CHOLHDL 2.7 02/21/2019 0425   VLDL 10 02/21/2019 0425   LDLCALC 68  03/23/2019 0839     Risk Assessment/Calculations:   {Does this patient have ATRIAL FIBRILLATION?[phone]}       Physical Exam:    VS:  LMP 12/14/2017     Wt Readings from Last 3 Encounters:  02/04/22 110 lb 0.2 oz (49.9 kg)  12/31/21 113 lb 12.1 oz (51.6 kg)  08/19/21 112 lb 14 oz (51.2 kg)     GEN: *** Well nourished, well developed in no acute distress HEENT: Normal NECK: No JVD; No carotid bruits CARDIAC: ***RRR, no murmurs, rubs, gallops RESPIRATORY:  Clear to auscultation without rales, wheezing or rhonchi  ABDOMEN: Soft, non-tender, non-distended MUSCULOSKELETAL:  No edema; No deformity. *** pedal pulses, ***bilaterally SKIN: Warm and dry NEUROLOGIC:  Alert and oriented x 3 PSYCHIATRIC:  Normal affect   EKG:  EKG is *** ordered today.  The ekg ordered today demonstrates ***  No BP recorded.  {Refresh Note OR Click here to enter BP  :1}***    Diagnoses:    No diagnosis found. Assessment and Plan:     HFpEF: ESRD: Hypertension  {Are you ordering a CV Procedure (e.g. stress test, cath, DCCV, TEE, etc)?   Press  F2        :[id number]}   Disposition:  Medication Adjustments/Labs and Tests Ordered: Current medicines are reviewed at length with the patient today.  Concerns regarding medicines are outlined above.  No orders of the defined types were placed in this encounter.  No orders of the defined types were placed in this encounter.   There are no Patient Instructions on file for this visit.   Signed, Emmaline Life, NP  02/09/2022 12:53 PM    [place]

## 2022-02-12 ENCOUNTER — Ambulatory Visit: Payer: Medicare HMO | Admitting: Nurse Practitioner

## 2022-03-07 IMAGING — DX DG CHEST 1V PORT SAME DAY
1 series · 1 of 1 positions shown · non-contrast
Comparison: 12/07/2019 radiograph and CT chest.

CLINICAL DATA: Postop.

EXAM:
PORTABLE CHEST 1 VIEW

[chest ap]
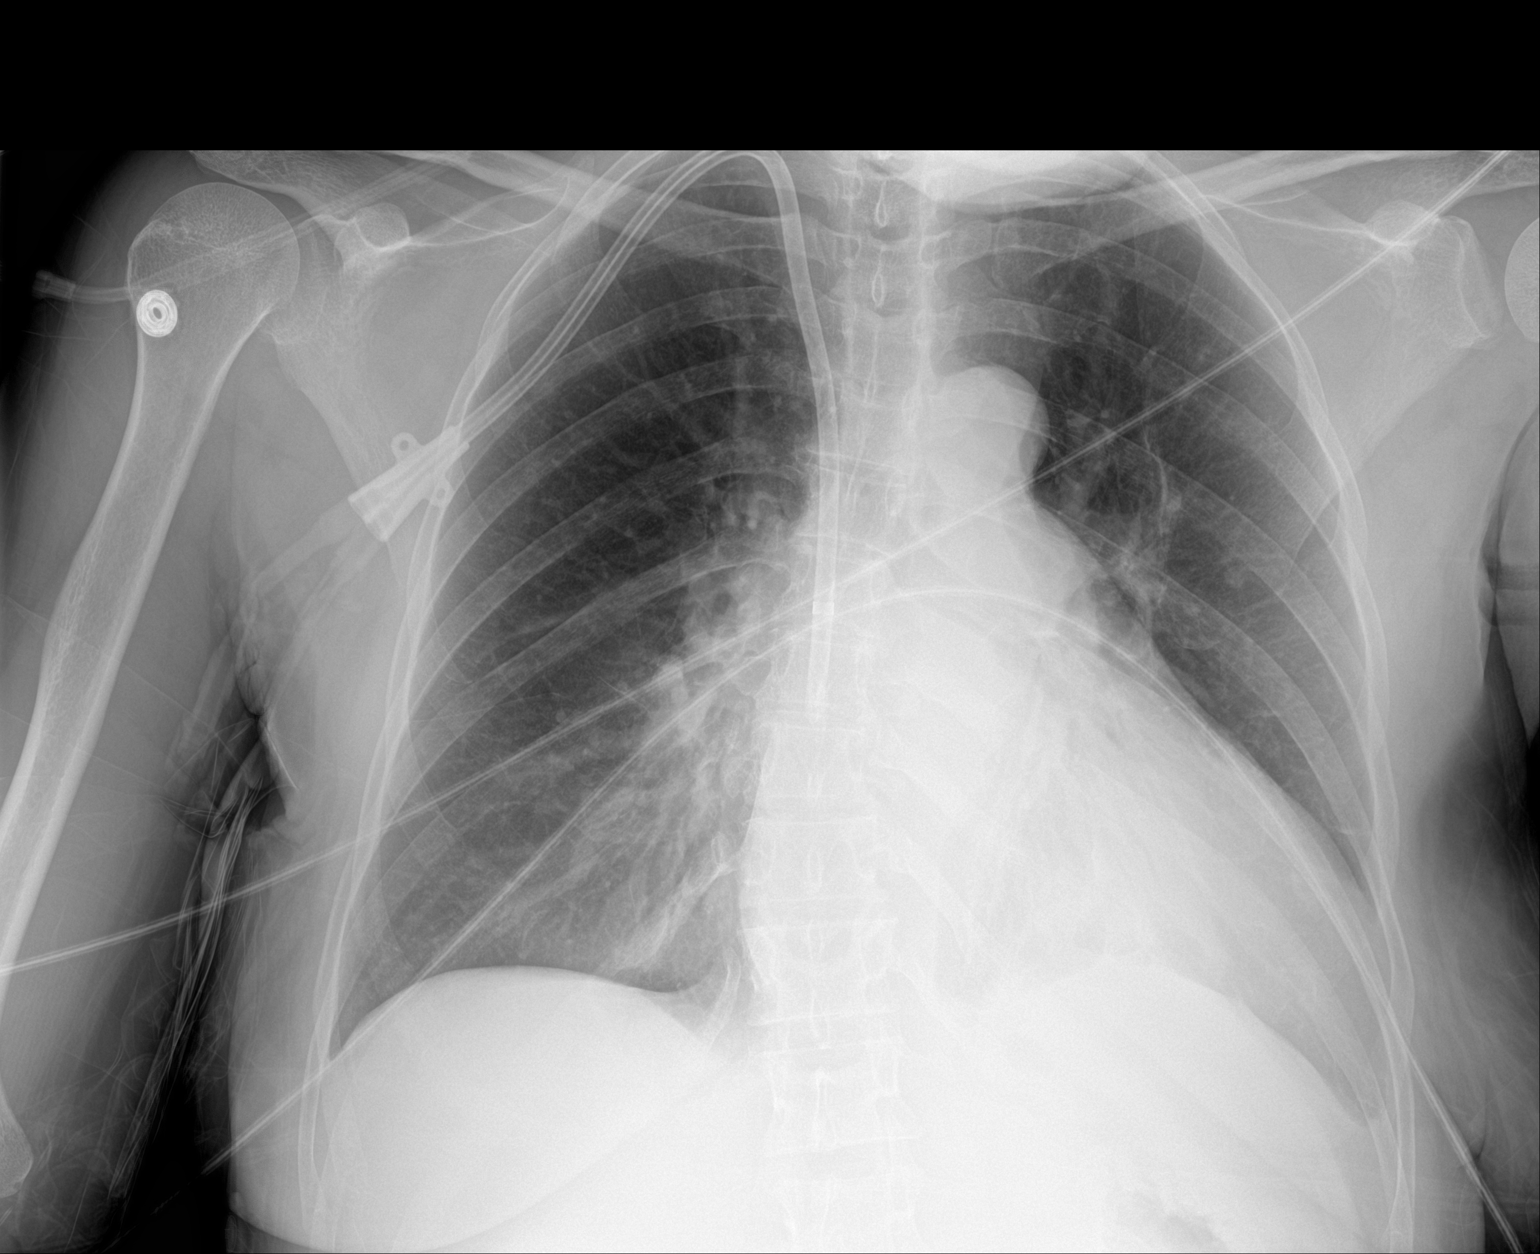

[1 of 1 positions shown; findings below may reference images not displayed]

FINDINGS: Right IJ dialysis catheter tip is in the low SVC or at the SVC RA
junction. Pneumothorax. Streaky densities are seen in the medial
lung bases. Lungs are otherwise clear. Tiny bilateral pleural
effusions.
IMPRESSION: 1. Right IJ dialysis catheter placement without pneumothorax.
2. Volume loss in the medial aspects of both lower lobes. Difficult
to exclude pneumonia.
3. Tiny bilateral pleural effusions.

## 2022-03-12 ENCOUNTER — Ambulatory Visit: Payer: Medicare HMO | Admitting: Physician Assistant

## 2022-03-12 NOTE — Progress Notes (Deleted)
Cardiology Office Note:    Date:  03/12/2022   ID:  [name], DOB 03-Apr-1976, MRN [id number]  PCP:  Sonia Side., [place] Providers Cardiologist:  Jenkins Rouge, MD { Click to update primary MD,subspecialty MD or APP then REFRESH:1}    Referring MD: Sonia Side., FNP   No chief complaint on file. ***  History of Present Illness:    Madeline Brown is a 46 y.o. female with a hx of ***   Coronary artery atherosclerosis, aortic atherosclerosis HFpEF HTN ESRD on HD T/Th/SAT, AV fistula Hx of Seizures Hx of intracranial hemorrhage HFpEF Secondary hyperparathyroidism of renal origin Tobacco abuse IDA, Anemia of Chronic Disease Chronic oxygen therapy, 2L baseline  Initially seen by Dr. Johnsie Cancel in August 2018 for elevated troponin in the setting of chronic renal failure and anemia.  Previous echocardiogram revealed small to moderate pericardial effusion without tamponade, EF 60 to 65%, severe LVH, estimated PA pressure 54 mmHg in January 2020.  Repeat echocardiogram in 2021 showed resolution of effusion with normal EF, no significant valvular disease. Previous history of high oxygen requirements, CTA was negative for PE. Previously saw Dr. Nelda Marseille and autoimmune studies were negative.  In 2021 she was hospitalized with hypertensive emergency ICH and seizures, BP meds adjusted, started on Keppra.  At that time was noncompliant with her medications and presented with pulmonary edema.  Currently being followed for [facility] at [facility]. Previous stress test in October 2021 was negative.  Most recently, presented to the hospital on 02/02/22 with shortness of breath that was ongoing for 4 days, required 2 L of oxygen at home.  Chest x-ray showed progression of right lung consolidation that was previously in in March 2023.  CT of the chest ruled out PE, but showed bilateral alveolar airspaces opacities concerning for CHF as well as bilateral atelectasis.   After hemodialysis her respiratory status improved and was weaned to room air before discharge.  Respiratory failure etiology was thought to be multifactorial, including volume overload in the setting of missed hemodialysis in the week leading up to admission, and lower airway infection.  Pulmonology consulted and recommended follow-up CT of the chest in 6 to 8 weeks.  She was told to complete a course of oral antibiotics for pneumonia at discharge.  Had no signs of volume overload on exam or indicated by labs during hospitalization.  Echo in the hospital revealed LVEF 60 to 65%, no wall motion abnormalities, severe LVH that was seen from previous echo, tricuspid regurg signal is not adequate for assessing PA pressure.  It was plan she will follow-up with pulmonology as outpatient due to concern for possible pulmonary hypertension on previous echocardiograms, was unable to be assessed from this recent echocardiogram.  Admitted to smoking 3 to 4 cigarettes/day during hospitalization.  Today she presents for follow-up.  HFpEF Coronary artery atherosclerosis, aortic atherosclerosis Hypertension End-stage renal disease Respiratory distress Tobacco abuse Past Medical History:  Diagnosis Date   Anemia    ESRD on HD    Headache, unspecified 09/26/2017   History of blood transfusion 07/2020   Hypertension    Intracranial hemorrhage (HCC)    LVH (left ventricular hypertrophy)    Pericardial effusion    Pulmonary hypertension ([place])    Seizure ([place]) 02/2019   Shortness of breath 07/15/2018   01/16/21 not currently   Stroke [facility])    Mild was told after having a seizure   Thrombocytopenia ([place])  Past Surgical History:  Procedure Laterality Date   AV FISTULA PLACEMENT Left 06/24/2017   Procedure: CREATION OF LEFT ARM BRACHIALCEPHALIC  ARTERIOVENOUS (AV) FISTULA;  Surgeon: Rosetta Posner, MD;  Location: [place];  Service: Vascular;  Laterality: Left;   CESAREAN SECTION     x2   COMPLEX WOUND CLOSURE  Left 07/30/2020   Procedure: COMPLEX WOUND CLOSURE;  Surgeon: Cherre Robins, MD;  Location: [place];  Service: Vascular;  Laterality: Left;   FISTULA SUPERFICIALIZATION Left 10/28/2017   Procedure: SUPERFICIALIZATION LEFT BRACHIOCEPHALIC ARTERIOVENOUS FISTULA;  Surgeon: Rosetta Posner, MD;  Location: [place];  Service: Vascular;  Laterality: Left;   INGUINAL HERNIA REPAIR Bilateral 02/01/2021   Procedure: LAPAROSCOPIC BILATERAL INGUINAL HERNIA REPAIR WITH MESH;  Surgeon: Kinsinger, Arta Bruce, MD;  [facility];  Service: General;  Laterality: Bilateral;   INSERTION OF DIALYSIS CATHETER Right 06/24/2017   Procedure: INSERTION OF TUNNELED DIALYSIS CATHETER;  Surgeon: Rosetta Posner, MD;  Location: [place];  Service: Vascular;  Laterality: Right;   INSERTION OF DIALYSIS CATHETER Right 07/30/2020   Procedure: INSERTION OF DIALYSIS CATHETER;  Surgeon: Cherre Robins, MD;  Location: [place];  Service: Vascular;  Laterality: Right;   LIGATION OF COMPETING BRANCHES OF ARTERIOVENOUS FISTULA  10/28/2017   Procedure: LIGATION OF COMPETING BRANCHES OF ARTERIOVENOUS FISTULA x3;  Surgeon: Rosetta Posner, MD;  Location: [place];  Service: Vascular;;   REVISON OF ARTERIOVENOUS FISTULA Left 07/30/2020   Procedure: LEFT UPPER EXTREMITY ARTERIOVENOUS FISTULA REVISON;  Surgeon: Cherre Robins, MD;  Location: [place];  Service: Vascular;  Laterality: Left;  PERIPHERAL NERVE BLOCK    Current Medications: No outpatient medications have been marked as taking for the 03/12/22 encounter (Appointment) with Richardson Dopp T, PA-C.     Allergies:   Visine [tetrahydrozoline hcl]   Social History   Socioeconomic History   Marital status: Single    Spouse name: Not on file   Number of children: Not on file   Years of education: Not on file   Highest education level: Not on file  Occupational History   Not on file  Tobacco Use   Smoking status: Every Day    Packs/day: 0.50    Years: 27.00    Total pack years: 13.50    Types:  Cigarettes    Start date: 50   Smokeless tobacco: Never  Vaping Use   Vaping Use: Never used  Substance and Sexual Activity   Alcohol use: Not Currently    Comment: occassional beer   Drug use: Yes    Types: Marijuana   Sexual activity: Yes    Birth control/protection: None  Other Topics Concern   Not on file  Social History Narrative   Not on file   Social Determinants of Health   Financial Resource Strain: Not on file  Food Insecurity: Not on file  Transportation Needs: Not on file  Physical Activity: Not on file  Stress: Not on file  Social Connections: Not on file     Family History: The patient's ***family history includes Aneurysm in her mother; Cancer in her father; Diabetes in her father; Heart disease in her father; Hypertension in her father; Seizures in her mother.  ROS:   Please see the history of present illness.    *** All other systems reviewed and are negative.  EKGs/Labs/Other Studies Reviewed:    The following studies were reviewed today: ***  EKG:  EKG is *** ordered today.  The ekg ordered  today demonstrates ***   2D echocardiogram on February 04, 2022:  1. Left ventricular ejection fraction, by estimation, is 60 to 65%. The  left ventricle has normal function. The left ventricle has no regional  wall motion abnormalities. There is severe left ventricular hypertrophy.  Left ventricular diastolic parameters   are indeterminate.   2. Right ventricular systolic function is normal. The right ventricular  size is normal. Tricuspid regurgitation signal is inadequate for assessing  PA pressure.   3. Left atrial size was moderately dilated.   4. Right atrial size was moderately dilated.   5. The mitral valve is normal in structure. No evidence of mitral valve  regurgitation.   6. The aortic valve was not well visualized. Aortic valve regurgitation  is not visualized.   7. The inferior vena cava is normal in size with greater than 50%  respiratory  variability, suggesting right atrial pressure of 3 mmHg.   Comparison(s): No significant change from prior study.   Recent Labs: 02/02/2022: ALT 10; B Natriuretic Peptide 344.6 02/04/2022: BUN 63; Creatinine, Ser 7.31; Hemoglobin 8.2; Magnesium 2.3; Platelets 150; Potassium 4.1; Sodium 137  Recent Lipid Panel    Component Value Date/Time   CHOL 172 03/23/2019 0839   TRIG 55 03/23/2019 0839   HDL 93 03/23/2019 0839   CHOLHDL 1.8 03/23/2019 0839   CHOLHDL 2.7 02/21/2019 0425   VLDL 10 02/21/2019 0425   LDLCALC 68 03/23/2019 0839     Risk Assessment/Calculations:   {Does this patient have ATRIAL FIBRILLATION?[phone]}  No BP recorded.  {Refresh Note OR Click here to enter BP  :1}***         Physical Exam:    VS:  LMP 12/14/2017     Wt Readings from Last 3 Encounters:  02/04/22 110 lb 0.2 oz (49.9 kg)  12/31/21 113 lb 12.1 oz (51.6 kg)  08/19/21 112 lb 14 oz (51.2 kg)     GEN: *** Well nourished, well developed in no acute distress HEENT: Normal NECK: No JVD; No carotid bruits LYMPHATICS: No lymphadenopathy CARDIAC: ***RRR, no murmurs, rubs, gallops RESPIRATORY:  Clear to auscultation without rales, wheezing or rhonchi  ABDOMEN: Soft, non-tender, non-distended MUSCULOSKELETAL:  No edema; No deformity  SKIN: Warm and dry NEUROLOGIC:  Alert and oriented x 3 PSYCHIATRIC:  Normal affect   ASSESSMENT:    No diagnosis found. PLAN:    In order of problems listed above:  ***      {Are you ordering a CV Procedure (e.g. stress test, cath, DCCV, TEE, etc)?   Press F2        :[id number]}    Medication Adjustments/Labs and Tests Ordered: Current medicines are reviewed at length with the patient today.  Concerns regarding medicines are outlined above.  No orders of the defined types were placed in this encounter.  No orders of the defined types were placed in this encounter.   There are no Patient Instructions on file for this visit.   Signed, Finis Bud, NP   03/12/2022 5:59 AM    [place]

## 2022-03-20 NOTE — Progress Notes (Deleted)
Cardiology Office Note:    Date:  03/20/2022   ID:  [name], DOB [birth year], MRN [id number]  PCP:  Sonia Side., [place] Providers Cardiologist:  Jenkins Rouge, MD { Click to update primary MD,subspecialty MD or APP then REFRESH:1}    Referring MD: Sonia Side., FNP   Chief Complaint:  No chief complaint on file. {Click here for Visit Info    :1}    History of Present Illness:   Madeline Brown is a 46 y.o. female with longstanding hypertension, ESRD on hemodialysis , history of seizures, history of intracranial hemorrhage, and HFpEF, tobacco abuse, COPD on home  O2.    Patient last saw Dr. Johnsie Cancel 06/2020 post hosp and had a pericardial effusion which resolved on f/u echo.  Recommended sleep study.  Patient hospitalized in Aug with resp failure and diastolic CHF resolved with dialysis. Had an ongoing pulm opacity and f/u with pulm. Echo normal LVEF severe LVH unchanged.    Past Medical History:  Diagnosis Date   Anemia    ESRD on HD    Headache, unspecified 09/26/2017   History of blood transfusion 07/2020   Hypertension    Intracranial hemorrhage (HCC)    LVH (left ventricular hypertrophy)    Pericardial effusion    Pulmonary hypertension ([place])    Seizure ([place]) 02/2019   Shortness of breath 07/15/2018   01/16/21 not currently   Stroke [facility])    Mild was told after having a seizure   Thrombocytopenia ([place])    Current Medications: No outpatient medications have been marked as taking for the 03/26/22 encounter (Appointment) with Imogene Burn, PA-C.    Allergies:   Visine [tetrahydrozoline hcl]   Social History   Tobacco Use   Smoking status: Every Day    Packs/day: 0.50    Years: 27.00    Total pack years: 13.50    Types: Cigarettes    Start date: 1996   Smokeless tobacco: Never  Vaping Use   Vaping Use: Never used  Substance Use Topics   Alcohol use: Not Currently    Comment: occassional beer   Drug use: Yes    Types:  Marijuana    Family Hx: The patient's family history includes Aneurysm in her mother; Cancer in her father; Diabetes in her father; Heart disease in her father; Hypertension in her father; Seizures in her mother.  ROS     Physical Exam:    VS:  LMP 12/14/2017     Wt Readings from Last 3 Encounters:  02/04/22 110 lb 0.2 oz (49.9 kg)  12/31/21 113 lb 12.1 oz (51.6 kg)  08/19/21 112 lb 14 oz (51.2 kg)    Physical Exam  GEN: Well nourished, well developed, in no acute distress  HEENT: normal  Neck: no JVD, carotid bruits, or masses Cardiac:RRR; no murmurs, rubs, or gallops  Respiratory:  clear to auscultation bilaterally, normal work of breathing GI: soft, nontender, nondistended, + BS Ext: without cyanosis, clubbing, or edema, Good distal pulses bilaterally MS: no deformity or atrophy  Skin: warm and dry, no rash Neuro:  Alert and Oriented x 3, Strength and sensation are intact Psych: euthymic mood, full affect        EKGs/Labs/Other Test Reviewed:    EKG:  EKG is *** ordered today.  The ekg ordered today demonstrates ***  Recent Labs: 02/02/2022: ALT 10; B Natriuretic Peptide 344.6 02/04/2022: BUN 63; Creatinine, Ser 7.31; Hemoglobin 8.2; Magnesium 2.3; Platelets 150; Potassium  4.1; Sodium 137   Recent Lipid Panel No results for input(s): "CHOL", "TRIG", "HDL", "VLDL", "LDLCALC", "LDLDIRECT" in the last 8760 hours.   Prior CV Studies: {Select studies to display:[id number]}     Echo: 02/04/2022 -LVEF 60 to 65% - No wall motion abnormalities - Severe left ventricular hypertrophy, unchanged from last echo - Tricuspid regurgitation signal is an adequate for assessing PA pressure  CXR: 02/02/2022 -Persistent consolidative opacity in the right mid and lower lung progressive since 08/18/2021   CTA with contrast: 02/02/2022 -No pulmonary embolism. -Cardiomegaly with bilateral interstitial thickening and alveolar airspace opacities concerning for CHF - Bilateral lower lobe  dependent airspace disease is likely atelectasis     Risk Assessment/Calculations/Metrics:   {Does this patient have ATRIAL FIBRILLATION?:[phone]}     No BP recorded.  {Refresh Note OR Click here to enter BP  :1}***    ASSESSMENT & PLAN:   No problem-specific Assessment & Plan notes found for this encounter.   HTN  HFpEF managed with HD-echo 01/2022 normal LVEF, severe LVH  ESRD on HD  Chronic hypoxemia  on home O2  Tobacco abuse  History of intracranial hemorrhage  History of seizure disorder      {Are you ordering a CV Procedure (e.g. stress test, cath, DCCV, TEE, etc)?   Press F2        :[id number]}   Dispo:  No follow-ups on file.   Medication Adjustments/Labs and Tests Ordered: Current medicines are reviewed at length with the patient today.  Concerns regarding medicines are outlined above.  Tests Ordered: No orders of the defined types were placed in this encounter.  Medication Changes: No orders of the defined types were placed in this encounter.  Signed, Ermalinda Barrios, PA-C  03/20/2022 7:25 AM    [address], [place], [place]  [address] Phone: [phone]; Fax: [phone]

## 2022-03-26 ENCOUNTER — Ambulatory Visit: Payer: Medicare HMO | Admitting: Physician Assistant

## 2022-03-26 DIAGNOSIS — N186 End stage renal disease: Secondary | ICD-10-CM

## 2022-03-26 DIAGNOSIS — I10 Essential (primary) hypertension: Secondary | ICD-10-CM

## 2022-03-26 DIAGNOSIS — I5030 Unspecified diastolic (congestive) heart failure: Secondary | ICD-10-CM

## 2022-04-04 ENCOUNTER — Inpatient Hospital Stay: Payer: Medicare HMO | Admitting: Pulmonary Disease

## 2022-04-04 NOTE — Progress Notes (Deleted)
Synopsis: First seen by [facility] in August 2023 when she was hospitalized for acute respiratory failure with hypoxemia.  Initially thought to have community-acquired pneumonia, however had little features of this and after volume removal with multiple sessions of dialysis had immediate relief of dyspnea and hypoxemia.  Imaging findings during that hospitalization showed right middle lobe and right lower lobe infiltrate.  Heavy smoking history.  Subjective:   PATIENT ID: Madeline Brown GENDER: female DOB: 12-30-1975, MRN: [id number]   HPI  No chief complaint on file.   *** Record review: No medical records available for review since her February 04, 2022 Hospital discharge.  Summary of that hospital visit above.  Past Medical History:  Diagnosis Date   Anemia    ESRD on HD    Headache, unspecified 09/26/2017   History of blood transfusion 07/2020   Hypertension    Intracranial hemorrhage (HCC)    LVH (left ventricular hypertrophy)    Pericardial effusion    Pulmonary hypertension (HCC)    Seizure ([place]) 02/2019   Shortness of breath 07/15/2018   01/16/21 not currently   Stroke ([place])    Mild was told after having a seizure   Thrombocytopenia ([place])      Family History  Problem Relation Age of Onset   Diabetes Father    Hypertension Father    Cancer Father    Heart disease Father    Aneurysm Mother    Seizures Mother      Social History   Socioeconomic History   Marital status: Single    Spouse name: Not on file   Number of children: Not on file   Years of education: Not on file   Highest education level: Not on file  Occupational History   Not on file  Tobacco Use   Smoking status: Every Day    Packs/day: 0.50    Years: 27.00    Total pack years: 13.50    Types: Cigarettes    Start date: 46   Smokeless tobacco: Never  Vaping Use   Vaping Use: Never used  Substance and Sexual Activity   Alcohol use: Not Currently    Comment: occassional beer    Drug use: Yes    Types: Marijuana   Sexual activity: Yes    Birth control/protection: None  Other Topics Concern   Not on file  Social History Narrative   Not on file   Social Determinants of Health   Financial Resource Strain: Not on file  Food Insecurity: Not on file  Transportation Needs: Not on file  Physical Activity: Not on file  Stress: Not on file  Social Connections: Not on file  Intimate Partner Violence: Not on file     Allergies  Allergen Reactions   Visine [Tetrahydrozoline Hcl] Swelling and Other (See Comments)    Eyes Swell     Outpatient Medications Prior to Visit  Medication Sig Dispense Refill   acetaminophen (TYLENOL) 500 MG tablet Take 500-1,000 mg by mouth every 6 (six) hours as needed for mild pain or headache.     amLODipine (NORVASC) 10 MG tablet Take 1 tablet (10 mg total) by mouth at bedtime. 30 tablet 0   B Complex-C-Folic Acid (DIALYVITE PO) Take 1 tablet by mouth daily with breakfast.     carvedilol (COREG) 25 MG tablet Take 1 tablet (25 mg total) by mouth 2 (two) times daily with a meal. (Patient taking differently: Take 25 mg by mouth See admin instructions. Take 25 mg by  mouth at 6:30 AM and 6:00 PM on Sun/Mon/Wed/Fri and 25 mg at 11:00 AM and 8:00 PM on Tues/Thurs/Sat) 60 tablet 0   hydrALAZINE (APRESOLINE) 100 MG tablet Take 1 tablet (100 mg total) by mouth 3 (three) times daily. 90 tablet 0   levETIRAcetam (KEPPRA) 1000 MG tablet Take 1 tablet (1,000 mg total) by mouth 2 (two) times daily. (Patient taking differently: Take 1,000 mg by mouth in the morning.) 60 tablet 1   sevelamer carbonate (RENVELA) 800 MG tablet Take 2 tablets (1,600 mg total) by mouth 3 (three) times daily with meals. (Patient taking differently: Take 800-2,400 mg by mouth See admin instructions. Take 1,600-2,400 mg by mouth three times a day with meals and 800 mg with each snack) 30 tablet 0   spironolactone (ALDACTONE) 50 MG tablet Take 1 tablet (50 mg total) by mouth daily.  30 tablet 0   No facility-administered medications prior to visit.    ROS    Objective:  Physical Exam   There were no vitals filed for this visit.  ***  CBC    Component Value Date/Time   WBC 7.0 02/04/2022 0446   RBC 2.54 (L) 02/04/2022 0446   HGB 8.2 (L) 02/04/2022 0446   HGB 10.5 (L) 02/25/2017 1103   HCT 25.4 (L) 02/04/2022 0446   HCT 35.1 02/25/2017 1103   PLT 150 02/04/2022 0446   PLT 314 02/25/2017 1103   MCV 100.0 02/04/2022 0446   MCV 82 02/25/2017 1103   MCH 32.3 02/04/2022 0446   MCHC 32.3 02/04/2022 0446   RDW 15.9 (H) 02/04/2022 0446   RDW 24.9 (H) 02/25/2017 1103   LYMPHSABS 0.6 (L) 02/02/2022 1144   LYMPHSABS 1.0 02/25/2017 1103   MONOABS 0.3 02/02/2022 1144   EOSABS 0.1 02/02/2022 1144   EOSABS 0.1 02/25/2017 1103   BASOSABS 0.0 02/02/2022 1144   BASOSABS 0.0 02/25/2017 1103     Chest imaging: August 2023 CT angiogram chest images independently reviewed showing interlobular septal thickening, groundglass opacification worse in the dependent areas, patchy, throughout most of her lung.  Dense consolidation in the bases of both lungs with trace intrafissural fluid.  Cardiomegaly.  PFT:  Labs:  Path:  Echo:  Heart Catheterization:       Assessment & Plan:   No diagnosis found.  Discussion: ***  Immunizations: Immunization History  Administered Date(s) Administered   Influenza, Quadrivalent, Recombinant, Inj, Pf 03/15/2019   Influenza,inj,Quad PF,6+ Mos 04/14/2020   PFIZER(Purple Top)SARS-COV-2 Vaccination 09/01/2019, 09/22/2019, 03/22/2020   Pneumococcal Conjugate-13 10/13/2017   Pneumococcal Polysaccharide-23 09/14/2018     Current Outpatient Medications:    acetaminophen (TYLENOL) 500 MG tablet, Take 500-1,000 mg by mouth every 6 (six) hours as needed for mild pain or headache., Disp: , Rfl:    amLODipine (NORVASC) 10 MG tablet, Take 1 tablet (10 mg total) by mouth at bedtime., Disp: 30 tablet, Rfl: 0   B Complex-C-Folic  Acid (DIALYVITE PO), Take 1 tablet by mouth daily with breakfast., Disp: , Rfl:    carvedilol (COREG) 25 MG tablet, Take 1 tablet (25 mg total) by mouth 2 (two) times daily with a meal. (Patient taking differently: Take 25 mg by mouth See admin instructions. Take 25 mg by mouth at 6:30 AM and 6:00 PM on Sun/Mon/Wed/Fri and 25 mg at 11:00 AM and 8:00 PM on Tues/Thurs/Sat), Disp: 60 tablet, Rfl: 0   hydrALAZINE (APRESOLINE) 100 MG tablet, Take 1 tablet (100 mg total) by mouth 3 (three) times daily., Disp: 90 tablet, Rfl: 0  levETIRAcetam (KEPPRA) 1000 MG tablet, Take 1 tablet (1,000 mg total) by mouth 2 (two) times daily. (Patient taking differently: Take 1,000 mg by mouth in the morning.), Disp: 60 tablet, Rfl: 1   sevelamer carbonate (RENVELA) 800 MG tablet, Take 2 tablets (1,600 mg total) by mouth 3 (three) times daily with meals. (Patient taking differently: Take 800-2,400 mg by mouth See admin instructions. Take 1,600-2,400 mg by mouth three times a day with meals and 800 mg with each snack), Disp: 30 tablet, Rfl: 0   spironolactone (ALDACTONE) 50 MG tablet, Take 1 tablet (50 mg total) by mouth daily., Disp: 30 tablet, Rfl: 0

## 2022-04-16 ENCOUNTER — Ambulatory Visit: Payer: Medicare HMO | Admitting: Physician Assistant

## 2022-04-21 ENCOUNTER — Inpatient Hospital Stay: Payer: Medicare HMO | Admitting: Pulmonary Disease

## 2022-04-21 NOTE — Progress Notes (Deleted)
Synopsis: First seen by [facility] in August 2023 when she was hospitalized for acute respiratory failure with hypoxemia.  Initially thought to have community-acquired pneumonia, however had little features of this and after volume removal with multiple sessions of dialysis had immediate relief of dyspnea and hypoxemia.  Imaging findings during that hospitalization showed right middle lobe and right lower lobe infiltrate.  Heavy smoking history.  Subjective:   PATIENT ID: Madeline Brown GENDER: female DOB: 07-Feb-1976, MRN: [id number]   HPI  No chief complaint on file.  Needs repeat CT, Arlyce Harman? *** Record review: No medical records available for review since her February 04, 2022 Hospital discharge.  Summary of that hospital visit above.  Past Medical History:  Diagnosis Date   Anemia    ESRD on HD    Headache, unspecified 09/26/2017   History of blood transfusion 07/2020   Hypertension    Intracranial hemorrhage (HCC)    LVH (left ventricular hypertrophy)    Pericardial effusion    Pulmonary hypertension (HCC)    Seizure ([place]) 02/2019   Shortness of breath 07/15/2018   01/16/21 not currently   Stroke ([place])    Mild was told after having a seizure   Thrombocytopenia ([place])      Family History  Problem Relation Age of Onset   Diabetes Father    Hypertension Father    Cancer Father    Heart disease Father    Aneurysm Mother    Seizures Mother      Social History   Socioeconomic History   Marital status: Single    Spouse name: Not on file   Number of children: Not on file   Years of education: Not on file   Highest education level: Not on file  Occupational History   Not on file  Tobacco Use   Smoking status: Every Day    Packs/day: 0.50    Years: 27.00    Total pack years: 13.50    Types: Cigarettes    Start date: 10   Smokeless tobacco: Never  Vaping Use   Vaping Use: Never used  Substance and Sexual Activity   Alcohol use: Not Currently    Comment:  occassional beer   Drug use: Yes    Types: Marijuana   Sexual activity: Yes    Birth control/protection: None  Other Topics Concern   Not on file  Social History Narrative   Not on file   Social Determinants of Health   Financial Resource Strain: Not on file  Food Insecurity: Not on file  Transportation Needs: Not on file  Physical Activity: Not on file  Stress: Not on file  Social Connections: Not on file  Intimate Partner Violence: Not on file     Allergies  Allergen Reactions   Visine [Tetrahydrozoline Hcl] Swelling and Other (See Comments)    Eyes Swell     Outpatient Medications Prior to Visit  Medication Sig Dispense Refill   acetaminophen (TYLENOL) 500 MG tablet Take 500-1,000 mg by mouth every 6 (six) hours as needed for mild pain or headache.     amLODipine (NORVASC) 10 MG tablet Take 1 tablet (10 mg total) by mouth at bedtime. 30 tablet 0   B Complex-C-Folic Acid (DIALYVITE PO) Take 1 tablet by mouth daily with breakfast.     carvedilol (COREG) 25 MG tablet Take 1 tablet (25 mg total) by mouth 2 (two) times daily with a meal. (Patient taking differently: Take 25 mg by mouth See admin instructions. Take  25 mg by mouth at 6:30 AM and 6:00 PM on Sun/Mon/Wed/Fri and 25 mg at 11:00 AM and 8:00 PM on Tues/Thurs/Sat) 60 tablet 0   hydrALAZINE (APRESOLINE) 100 MG tablet Take 1 tablet (100 mg total) by mouth 3 (three) times daily. 90 tablet 0   levETIRAcetam (KEPPRA) 1000 MG tablet Take 1 tablet (1,000 mg total) by mouth 2 (two) times daily. (Patient taking differently: Take 1,000 mg by mouth in the morning.) 60 tablet 1   sevelamer carbonate (RENVELA) 800 MG tablet Take 2 tablets (1,600 mg total) by mouth 3 (three) times daily with meals. (Patient taking differently: Take 800-2,400 mg by mouth See admin instructions. Take 1,600-2,400 mg by mouth three times a day with meals and 800 mg with each snack) 30 tablet 0   spironolactone (ALDACTONE) 50 MG tablet Take 1 tablet (50 mg  total) by mouth daily. 30 tablet 0   No facility-administered medications prior to visit.    ROS    Objective:  Physical Exam   There were no vitals filed for this visit.  ***  CBC    Component Value Date/Time   WBC 7.0 02/04/2022 0446   RBC 2.54 (L) 02/04/2022 0446   HGB 8.2 (L) 02/04/2022 0446   HGB 10.5 (L) 02/25/2017 1103   HCT 25.4 (L) 02/04/2022 0446   HCT 35.1 02/25/2017 1103   PLT 150 02/04/2022 0446   PLT 314 02/25/2017 1103   MCV 100.0 02/04/2022 0446   MCV 82 02/25/2017 1103   MCH 32.3 02/04/2022 0446   MCHC 32.3 02/04/2022 0446   RDW 15.9 (H) 02/04/2022 0446   RDW 24.9 (H) 02/25/2017 1103   LYMPHSABS 0.6 (L) 02/02/2022 1144   LYMPHSABS 1.0 02/25/2017 1103   MONOABS 0.3 02/02/2022 1144   EOSABS 0.1 02/02/2022 1144   EOSABS 0.1 02/25/2017 1103   BASOSABS 0.0 02/02/2022 1144   BASOSABS 0.0 02/25/2017 1103     Chest imaging: August 2023 CT angiogram chest images independently reviewed showing interlobular septal thickening, groundglass opacification worse in the dependent areas, patchy, throughout most of her lung.  Dense consolidation in the bases of both lungs with trace intrafissural fluid.  Cardiomegaly.  PFT:  Labs:  Path:  Echo:  Heart Catheterization:       Assessment & Plan:   No diagnosis found.  Discussion: ***  Immunizations: Immunization History  Administered Date(s) Administered   Influenza, Quadrivalent, Recombinant, Inj, Pf 03/15/2019   Influenza,inj,Quad PF,6+ Mos 04/14/2020   PFIZER(Purple Top)SARS-COV-2 Vaccination 09/01/2019, 09/22/2019, 03/22/2020   Pneumococcal Conjugate-13 10/13/2017   Pneumococcal Polysaccharide-23 09/14/2018     Current Outpatient Medications:    acetaminophen (TYLENOL) 500 MG tablet, Take 500-1,000 mg by mouth every 6 (six) hours as needed for mild pain or headache., Disp: , Rfl:    amLODipine (NORVASC) 10 MG tablet, Take 1 tablet (10 mg total) by mouth at bedtime., Disp: 30 tablet, Rfl:  0   B Complex-C-Folic Acid (DIALYVITE PO), Take 1 tablet by mouth daily with breakfast., Disp: , Rfl:    carvedilol (COREG) 25 MG tablet, Take 1 tablet (25 mg total) by mouth 2 (two) times daily with a meal. (Patient taking differently: Take 25 mg by mouth See admin instructions. Take 25 mg by mouth at 6:30 AM and 6:00 PM on Sun/Mon/Wed/Fri and 25 mg at 11:00 AM and 8:00 PM on Tues/Thurs/Sat), Disp: 60 tablet, Rfl: 0   hydrALAZINE (APRESOLINE) 100 MG tablet, Take 1 tablet (100 mg total) by mouth 3 (three) times daily., Disp: 90  tablet, Rfl: 0   levETIRAcetam (KEPPRA) 1000 MG tablet, Take 1 tablet (1,000 mg total) by mouth 2 (two) times daily. (Patient taking differently: Take 1,000 mg by mouth in the morning.), Disp: 60 tablet, Rfl: 1   sevelamer carbonate (RENVELA) 800 MG tablet, Take 2 tablets (1,600 mg total) by mouth 3 (three) times daily with meals. (Patient taking differently: Take 800-2,400 mg by mouth See admin instructions. Take 1,600-2,400 mg by mouth three times a day with meals and 800 mg with each snack), Disp: 30 tablet, Rfl: 0   spironolactone (ALDACTONE) 50 MG tablet, Take 1 tablet (50 mg total) by mouth daily., Disp: 30 tablet, Rfl: 0

## 2022-04-29 NOTE — Progress Notes (Incomplete)
Cardiology Office Note   Date:  04/29/2022   ID:  Madeline Brown, DOB [birth year], MRN [id number]  PCP:  Sonia Side., FNP  Cardiologist:   Jenkins Rouge, MD   No chief complaint on file.     History of Present Illness:  46 y.o. 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 On oxygen at home Significant HTN.  Prior history of seizures and ICH. Last hospital d/c 02/04/22 CTA negative for PE with some CHF and atelectasis.    TTE 02/04/22 EF 60-65% severe LVH Normal RV Moderate bi atrial enlargement Normal AV/MV  Labs with Hct 25.4 , PLT 150  K 4.1 Cr 7.31    ***     Past Medical History:  Diagnosis Date   Anemia    ESRD on HD    Headache, unspecified 09/26/2017   History of blood transfusion 07/2020   Hypertension    Intracranial hemorrhage (HCC)    LVH (left ventricular hypertrophy)    Pericardial effusion    Pulmonary hypertension ([place])    Seizure ([place]) 02/2019   Shortness of breath 07/15/2018   01/16/21 not currently   Stroke [facility])    Mild was told after having a seizure   Thrombocytopenia ([place])     Past Surgical History:  Procedure Laterality Date   AV FISTULA PLACEMENT Left 06/24/2017   Procedure: CREATION OF LEFT ARM BRACHIALCEPHALIC  ARTERIOVENOUS (AV) FISTULA;  Surgeon: Rosetta Posner, MD;  Location: [facility];  Service: Vascular;  Laterality: Left;   CESAREAN SECTION     x2   COMPLEX WOUND CLOSURE Left 07/30/2020   Procedure: COMPLEX WOUND CLOSURE;  Surgeon: Cherre Robins, MD;  Location: [facility];  Service: Vascular;  Laterality: Left;   FISTULA SUPERFICIALIZATION Left 10/28/2017   Procedure: SUPERFICIALIZATION LEFT BRACHIOCEPHALIC ARTERIOVENOUS FISTULA;  Surgeon: Rosetta Posner, MD;  Location: [facility];  Service: Vascular;  Laterality: Left;   INGUINAL HERNIA REPAIR Bilateral 02/01/2021   Procedure:  LAPAROSCOPIC BILATERAL INGUINAL HERNIA REPAIR WITH MESH;  Surgeon: Kinsinger, Arta Bruce, MD;  Location: [facility];  Service: General;  Laterality: Bilateral;   INSERTION OF DIALYSIS CATHETER Right 06/24/2017   Procedure: INSERTION OF TUNNELED DIALYSIS CATHETER;  Surgeon: Rosetta Posner, MD;  Location: [facility];  Service: Vascular;  Laterality: Right;   INSERTION OF DIALYSIS CATHETER Right 07/30/2020   Procedure: INSERTION OF DIALYSIS CATHETER;  Surgeon: Cherre Robins, MD;  Location: [facility] [place];  Service: Vascular;  Laterality: Right;   LIGATION OF COMPETING BRANCHES OF ARTERIOVENOUS FISTULA  10/28/2017   Procedure: LIGATION OF COMPETING BRANCHES OF ARTERIOVENOUS FISTULA x3;  Surgeon: Rosetta Posner, MD;  Location: [facility] [place];  Service: Vascular;;   REVISON OF ARTERIOVENOUS FISTULA Left 07/30/2020   Procedure: LEFT UPPER EXTREMITY ARTERIOVENOUS FISTULA REVISON;  Surgeon: Cherre Robins, MD;  Location: [facility];  Service: Vascular;  Laterality: Left;  PERIPHERAL NERVE BLOCK     Current Outpatient Medications  Medication Sig Dispense Refill   acetaminophen (TYLENOL) 500 MG tablet Take 500-1,000 mg by mouth every 6 (six) hours as needed for mild pain or headache.     amLODipine (NORVASC) 10 MG tablet Take 1 tablet (10 mg total) by mouth at bedtime. 30 tablet 0   B Complex-C-Folic Acid (DIALYVITE PO) Take 1 tablet by mouth daily with breakfast.     carvedilol (  COREG) 25 MG tablet Take 1 tablet (25 mg total) by mouth 2 (two) times daily with a meal. (Patient taking differently: Take 25 mg by mouth See admin instructions. Take 25 mg by mouth at 6:30 AM and 6:00 PM on Sun/Mon/Wed/Fri and 25 mg at 11:00 AM and 8:00 PM on Tues/Thurs/Sat) 60 tablet 0   hydrALAZINE (APRESOLINE) 100 MG tablet Take 1 tablet (100 mg total) by mouth 3 (three) times daily. 90 tablet 0   levETIRAcetam (KEPPRA) 1000 MG tablet Take 1 tablet (1,000 mg total) by mouth 2 (two) times daily. (Patient taking differently: Take 1,000 mg by mouth in the  morning.) 60 tablet 1   sevelamer carbonate (RENVELA) 800 MG tablet Take 2 tablets (1,600 mg total) by mouth 3 (three) times daily with meals. (Patient taking differently: Take 800-2,400 mg by mouth See admin instructions. Take 1,600-2,400 mg by mouth three times a day with meals and 800 mg with each snack) 30 tablet 0   spironolactone (ALDACTONE) 50 MG tablet Take 1 tablet (50 mg total) by mouth daily. 30 tablet 0   No current facility-administered medications for this visit.    Allergies:   Visine [tetrahydrozoline hcl]    Social History:  The patient  reports that she has been smoking cigarettes. She started smoking about 27 years ago. She has a 13.50 pack-year smoking history. She has never used smokeless tobacco. She reports that she does not currently use alcohol. She reports current drug use. Drug: Marijuana.   Family History:  The patient's family history includes Aneurysm in her mother; Cancer in her father; Diabetes in her father; Heart disease in her father; Hypertension in her father; Seizures in her mother.    ROS:  Please see the history of present illness.   Otherwise, review of systems are positive for none.   All other systems are reviewed and negative.    PHYSICAL EXAM: VS:  LMP 12/14/2017  , BMI There is no height or weight on file to calculate BMI. Affect appropriate Chronically ill black female  HEENT: normal Neck supple with no adenopathy JVP normal no bruits no thyromegaly Lungs clear with no wheezing and good diaphragmatic motion Heart:  S1/S2 continuous shunt murmur, no rub, gallop or click PMI normal Abdomen: benighn, BS positve, no tenderness, no AAA no bruit.  No HSM or HJR Distal pulses intact with no bruits No edema Neuro non-focal Skin warm and dry No muscular weakness LUE fistula is gigantic especially for her size loud thrills    EKG:  07/12/18 SR LAE LVH low voltage    Recent Labs: 02/02/2022: ALT 10; B Natriuretic Peptide 344.6 02/04/2022:  BUN 63; Creatinine, Ser 7.31; Hemoglobin 8.2; Magnesium 2.3; Platelets 150; Potassium 4.1; Sodium 137    Lipid Panel    Component Value Date/Time   CHOL 172 03/23/2019 0839   TRIG 55 03/23/2019 0839   HDL 93 03/23/2019 0839   CHOLHDL 1.8 03/23/2019 0839   CHOLHDL 2.7 02/21/2019 0425   VLDL 10 02/21/2019 0425   LDLCALC 68 03/23/2019 0839      Wt Readings from Last 3 Encounters:  02/04/22 110 lb 0.2 oz (49.9 kg)  12/31/21 113 lb 12.1 oz (51.6 kg)  08/19/21 112 lb 14 oz (51.2 kg)      Other studies Reviewed: TTE August 2023  Labs and chest CT D/C summary 02/04/22     ASSESSMENT AND PLAN:  HFpEF:  related to severe LVH, CRF and missed dialysis Prior large CO from fistula plicated. Volume  control with dialysis EF normal by TTE August 2023 CRF:  continue dialysis LUE fistula Revised for high output x 2 most recently by Dr Stanford Breed 07/30/20  On dialysis since 2019 Anemia  Aranasp per nephrology Seizures:  quiescent on Keppra  HTN:  continue norvasc, coreg, aldactone and hydralazine    Current medicines are reviewed at length with the patient today.  The patient does not have concerns regarding medicines.  The following changes have been made:  no change  Labs/ tests ordered today include: None   No orders of the defined types were placed in this encounter.    Disposition:   FU with cardiology year Close f/u with [facility] for access and nephrology     Signed, Jenkins Rouge, MD  04/29/2022 2:25 PM    [address], [place], [place]  [address] Phone: [phone]; Fax: [phone]

## 2022-04-30 ENCOUNTER — Ambulatory Visit: Payer: Medicare HMO | Admitting: Cardiovascular Disease

## 2022-05-07 ENCOUNTER — Inpatient Hospital Stay: Payer: Medicare HMO | Admitting: Pulmonary Disease

## 2022-05-07 NOTE — Progress Notes (Deleted)
Synopsis: First seen by [facility] in August 2023 when she was hospitalized for acute respiratory failure with hypoxemia.  Initially thought to have community-acquired pneumonia, however had little features of this and after volume removal with multiple sessions of dialysis had immediate relief of dyspnea and hypoxemia.  Imaging findings during that hospitalization showed right middle lobe and right lower lobe infiltrate.  Heavy smoking history.  Subjective:   PATIENT ID: Madeline Brown GENDER: female DOB: 01-20-1976, MRN: [id number]   HPI  No chief complaint on file.  Needs repeat CT, Arlyce Harman? *** Record review: No medical records available for review since her February 04, 2022 Hospital discharge.  Summary of that hospital visit above.  Past Medical History:  Diagnosis Date   Anemia    ESRD on HD    Headache, unspecified 09/26/2017   History of blood transfusion 07/2020   Hypertension    Intracranial hemorrhage (HCC)    LVH (left ventricular hypertrophy)    Pericardial effusion    Pulmonary hypertension (HCC)    Seizure ([place]) 02/2019   Shortness of breath 07/15/2018   01/16/21 not currently   Stroke ([place])    Mild was told after having a seizure   Thrombocytopenia ([place])      Family History  Problem Relation Age of Onset   Diabetes Father    Hypertension Father    Cancer Father    Heart disease Father    Aneurysm Mother    Seizures Mother      Social History   Socioeconomic History   Marital status: Single    Spouse name: Not on file   Number of children: Not on file   Years of education: Not on file   Highest education level: Not on file  Occupational History   Not on file  Tobacco Use   Smoking status: Every Day    Packs/day: 0.50    Years: 27.00    Total pack years: 13.50    Types: Cigarettes    Start date: 22   Smokeless tobacco: Never  Vaping Use   Vaping Use: Never used  Substance and Sexual Activity   Alcohol use: Not Currently    Comment:  occassional beer   Drug use: Yes    Types: Marijuana   Sexual activity: Yes    Birth control/protection: None  Other Topics Concern   Not on file  Social History Narrative   Not on file   Social Determinants of Health   Financial Resource Strain: Not on file  Food Insecurity: Not on file  Transportation Needs: Not on file  Physical Activity: Not on file  Stress: Not on file  Social Connections: Not on file  Intimate Partner Violence: Not on file     Allergies  Allergen Reactions   Visine [Tetrahydrozoline Hcl] Swelling and Other (See Comments)    Eyes Swell     Outpatient Medications Prior to Visit  Medication Sig Dispense Refill   acetaminophen (TYLENOL) 500 MG tablet Take 500-1,000 mg by mouth every 6 (six) hours as needed for mild pain or headache.     amLODipine (NORVASC) 10 MG tablet Take 1 tablet (10 mg total) by mouth at bedtime. 30 tablet 0   B Complex-C-Folic Acid (DIALYVITE PO) Take 1 tablet by mouth daily with breakfast.     carvedilol (COREG) 25 MG tablet Take 1 tablet (25 mg total) by mouth 2 (two) times daily with a meal. (Patient taking differently: Take 25 mg by mouth See admin instructions. Take  25 mg by mouth at 6:30 AM and 6:00 PM on Sun/Mon/Wed/Fri and 25 mg at 11:00 AM and 8:00 PM on Tues/Thurs/Sat) 60 tablet 0   hydrALAZINE (APRESOLINE) 100 MG tablet Take 1 tablet (100 mg total) by mouth 3 (three) times daily. 90 tablet 0   levETIRAcetam (KEPPRA) 1000 MG tablet Take 1 tablet (1,000 mg total) by mouth 2 (two) times daily. (Patient taking differently: Take 1,000 mg by mouth in the morning.) 60 tablet 1   sevelamer carbonate (RENVELA) 800 MG tablet Take 2 tablets (1,600 mg total) by mouth 3 (three) times daily with meals. (Patient taking differently: Take 800-2,400 mg by mouth See admin instructions. Take 1,600-2,400 mg by mouth three times a day with meals and 800 mg with each snack) 30 tablet 0   spironolactone (ALDACTONE) 50 MG tablet Take 1 tablet (50 mg  total) by mouth daily. 30 tablet 0   No facility-administered medications prior to visit.    ROS    Objective:  Physical Exam   There were no vitals filed for this visit.  ***  CBC    Component Value Date/Time   WBC 7.0 02/04/2022 0446   RBC 2.54 (L) 02/04/2022 0446   HGB 8.2 (L) 02/04/2022 0446   HGB 10.5 (L) 02/25/2017 1103   HCT 25.4 (L) 02/04/2022 0446   HCT 35.1 02/25/2017 1103   PLT 150 02/04/2022 0446   PLT 314 02/25/2017 1103   MCV 100.0 02/04/2022 0446   MCV 82 02/25/2017 1103   MCH 32.3 02/04/2022 0446   MCHC 32.3 02/04/2022 0446   RDW 15.9 (H) 02/04/2022 0446   RDW 24.9 (H) 02/25/2017 1103   LYMPHSABS 0.6 (L) 02/02/2022 1144   LYMPHSABS 1.0 02/25/2017 1103   MONOABS 0.3 02/02/2022 1144   EOSABS 0.1 02/02/2022 1144   EOSABS 0.1 02/25/2017 1103   BASOSABS 0.0 02/02/2022 1144   BASOSABS 0.0 02/25/2017 1103     Chest imaging: August 2023 CT angiogram chest images independently reviewed showing interlobular septal thickening, groundglass opacification worse in the dependent areas, patchy, throughout most of her lung.  Dense consolidation in the bases of both lungs with trace intrafissural fluid.  Cardiomegaly.  PFT:  Labs:  Path:  Echo:  Heart Catheterization:       Assessment & Plan:   No diagnosis found.  Discussion: ***  Immunizations: Immunization History  Administered Date(s) Administered   Influenza, Quadrivalent, Recombinant, Inj, Pf 03/15/2019   Influenza,inj,Quad PF,6+ Mos 04/14/2020   PFIZER(Purple Top)SARS-COV-2 Vaccination 09/01/2019, 09/22/2019, 03/22/2020   Pneumococcal Conjugate-13 10/13/2017   Pneumococcal Polysaccharide-23 09/14/2018     Current Outpatient Medications:    acetaminophen (TYLENOL) 500 MG tablet, Take 500-1,000 mg by mouth every 6 (six) hours as needed for mild pain or headache., Disp: , Rfl:    amLODipine (NORVASC) 10 MG tablet, Take 1 tablet (10 mg total) by mouth at bedtime., Disp: 30 tablet, Rfl:  0   B Complex-C-Folic Acid (DIALYVITE PO), Take 1 tablet by mouth daily with breakfast., Disp: , Rfl:    carvedilol (COREG) 25 MG tablet, Take 1 tablet (25 mg total) by mouth 2 (two) times daily with a meal. (Patient taking differently: Take 25 mg by mouth See admin instructions. Take 25 mg by mouth at 6:30 AM and 6:00 PM on Sun/Mon/Wed/Fri and 25 mg at 11:00 AM and 8:00 PM on Tues/Thurs/Sat), Disp: 60 tablet, Rfl: 0   hydrALAZINE (APRESOLINE) 100 MG tablet, Take 1 tablet (100 mg total) by mouth 3 (three) times daily., Disp: 90  tablet, Rfl: 0   levETIRAcetam (KEPPRA) 1000 MG tablet, Take 1 tablet (1,000 mg total) by mouth 2 (two) times daily. (Patient taking differently: Take 1,000 mg by mouth in the morning.), Disp: 60 tablet, Rfl: 1   sevelamer carbonate (RENVELA) 800 MG tablet, Take 2 tablets (1,600 mg total) by mouth 3 (three) times daily with meals. (Patient taking differently: Take 800-2,400 mg by mouth See admin instructions. Take 1,600-2,400 mg by mouth three times a day with meals and 800 mg with each snack), Disp: 30 tablet, Rfl: 0   spironolactone (ALDACTONE) 50 MG tablet, Take 1 tablet (50 mg total) by mouth daily., Disp: 30 tablet, Rfl: 0

## 2022-06-07 ENCOUNTER — Ambulatory Visit (HOSPITAL_COMMUNITY)
Admission: EM | Admit: 2022-06-07 | Discharge: 2022-06-07 | Disposition: A | Discharge status: Home / Self Care | Payer: Medicare HMO | Attending: Emergency Medicine | Admitting: Emergency Medicine

## 2022-06-07 ENCOUNTER — Ambulatory Visit (INDEPENDENT_AMBULATORY_CARE_PROVIDER_SITE_OTHER): Payer: Medicare HMO

## 2022-06-07 ENCOUNTER — Inpatient Hospital Stay (HOSPITAL_COMMUNITY)
Admission: EM | Admit: 2022-06-07 | Discharge: 2022-06-10 | DRG: 291 | Disposition: A | Discharge status: Home / Self Care | Payer: Medicare HMO | Source: Ambulatory Visit | Attending: Family Medicine | Admitting: Family Medicine

## 2022-06-07 ENCOUNTER — Encounter (HOSPITAL_COMMUNITY): Payer: Self-pay | Admitting: Emergency Medicine

## 2022-06-07 DIAGNOSIS — Z833 Family history of diabetes mellitus: Secondary | ICD-10-CM | POA: Diagnosis not present

## 2022-06-07 DIAGNOSIS — Z79899 Other long term (current) drug therapy: Secondary | ICD-10-CM | POA: Diagnosis not present

## 2022-06-07 DIAGNOSIS — M898X9 Other specified disorders of bone, unspecified site: Secondary | ICD-10-CM | POA: Diagnosis present

## 2022-06-07 DIAGNOSIS — J9601 Acute respiratory failure with hypoxia: Secondary | ICD-10-CM | POA: Diagnosis present

## 2022-06-07 DIAGNOSIS — G40909 Epilepsy, unspecified, not intractable, without status epilepticus: Secondary | ICD-10-CM

## 2022-06-07 DIAGNOSIS — J810 Acute pulmonary edema: Secondary | ICD-10-CM

## 2022-06-07 DIAGNOSIS — I10 Essential (primary) hypertension: Secondary | ICD-10-CM | POA: Diagnosis present

## 2022-06-07 DIAGNOSIS — N186 End stage renal disease: Secondary | ICD-10-CM | POA: Diagnosis present

## 2022-06-07 DIAGNOSIS — R0602 Shortness of breath: Secondary | ICD-10-CM | POA: Diagnosis present

## 2022-06-07 DIAGNOSIS — Z992 Dependence on renal dialysis: Secondary | ICD-10-CM | POA: Diagnosis present

## 2022-06-07 DIAGNOSIS — I272 Pulmonary hypertension, unspecified: Secondary | ICD-10-CM | POA: Diagnosis present

## 2022-06-07 DIAGNOSIS — F17210 Nicotine dependence, cigarettes, uncomplicated: Secondary | ICD-10-CM | POA: Diagnosis present

## 2022-06-07 DIAGNOSIS — J90 Pleural effusion, not elsewhere classified: Secondary | ICD-10-CM | POA: Diagnosis not present

## 2022-06-07 DIAGNOSIS — I132 Hypertensive heart and chronic kidney disease with heart failure and with stage 5 chronic kidney disease, or end stage renal disease: Principal | ICD-10-CM | POA: Diagnosis present

## 2022-06-07 DIAGNOSIS — Z8249 Family history of ischemic heart disease and other diseases of the circulatory system: Secondary | ICD-10-CM | POA: Diagnosis not present

## 2022-06-07 DIAGNOSIS — N2581 Secondary hyperparathyroidism of renal origin: Secondary | ICD-10-CM | POA: Diagnosis present

## 2022-06-07 DIAGNOSIS — D631 Anemia in chronic kidney disease: Secondary | ICD-10-CM | POA: Diagnosis present

## 2022-06-07 DIAGNOSIS — D638 Anemia in other chronic diseases classified elsewhere: Secondary | ICD-10-CM | POA: Diagnosis not present

## 2022-06-07 DIAGNOSIS — D696 Thrombocytopenia, unspecified: Secondary | ICD-10-CM | POA: Diagnosis present

## 2022-06-07 DIAGNOSIS — I5033 Acute on chronic diastolic (congestive) heart failure: Secondary | ICD-10-CM | POA: Diagnosis present

## 2022-06-07 DIAGNOSIS — Z1152 Encounter for screening for COVID-19: Secondary | ICD-10-CM

## 2022-06-07 DIAGNOSIS — R221 Localized swelling, mass and lump, neck: Secondary | ICD-10-CM | POA: Diagnosis not present

## 2022-06-07 DIAGNOSIS — Z8673 Personal history of transient ischemic attack (TIA), and cerebral infarction without residual deficits: Secondary | ICD-10-CM

## 2022-06-07 DIAGNOSIS — E876 Hypokalemia: Secondary | ICD-10-CM | POA: Diagnosis present

## 2022-06-07 DIAGNOSIS — I509 Heart failure, unspecified: Secondary | ICD-10-CM | POA: Insufficient documentation

## 2022-06-07 LAB — I-STAT CHEM 8, ED
BUN: 22 mg/dL — ABNORMAL HIGH (ref 6–20)
Calcium, Ion: 0.95 mmol/L — ABNORMAL LOW (ref 1.15–1.40)
Chloride: 94 mmol/L — ABNORMAL LOW (ref 98–111)
Creatinine, Ser: 4.7 mg/dL — ABNORMAL HIGH (ref 0.44–1.00)
Glucose, Bld: 83 mg/dL (ref 70–99)
HCT: 25 % — ABNORMAL LOW (ref 36.0–46.0)
Hemoglobin: 8.5 g/dL — ABNORMAL LOW (ref 12.0–15.0)
Potassium: 3.1 mmol/L — ABNORMAL LOW (ref 3.5–5.1)
Sodium: 137 mmol/L (ref 135–145)
TCO2: 36 mmol/L — ABNORMAL HIGH (ref 22–32)

## 2022-06-07 LAB — COMPREHENSIVE METABOLIC PANEL
ALT: 10 U/L (ref 0–44)
AST: 16 U/L (ref 15–41)
Albumin: 3 g/dL — ABNORMAL LOW (ref 3.5–5.0)
Alkaline Phosphatase: 30 U/L — ABNORMAL LOW (ref 38–126)
Anion gap: 12 (ref 5–15)
BUN: 20 mg/dL (ref 6–20)
CO2: 31 mmol/L (ref 22–32)
Calcium: 8.3 mg/dL — ABNORMAL LOW (ref 8.9–10.3)
Chloride: 96 mmol/L — ABNORMAL LOW (ref 98–111)
Creatinine, Ser: 4.56 mg/dL — ABNORMAL HIGH (ref 0.44–1.00)
GFR, Estimated: 11 mL/min — ABNORMAL LOW (ref >60)
Glucose, Bld: 87 mg/dL (ref 70–99)
Potassium: 3.1 mmol/L — ABNORMAL LOW (ref 3.5–5.1)
Sodium: 139 mmol/L (ref 135–145)
Total Bilirubin: 1.7 mg/dL — ABNORMAL HIGH (ref 0.3–1.2)
Total Protein: 6.1 g/dL — ABNORMAL LOW (ref 6.5–8.1)

## 2022-06-07 LAB — CBC WITH DIFFERENTIAL/PLATELET
Abs Immature Granulocytes: 0.01 10*3/uL (ref 0.00–0.07)
Basophils Absolute: 0 10*3/uL (ref 0.0–0.1)
Basophils Relative: 1 %
Eosinophils Absolute: 0.1 10*3/uL (ref 0.0–0.5)
Eosinophils Relative: 2 %
HCT: 23.4 % — ABNORMAL LOW (ref 36.0–46.0)
Hemoglobin: 7.6 g/dL — ABNORMAL LOW (ref 12.0–15.0)
Immature Granulocytes: 0 %
Lymphocytes Relative: 15 %
Lymphs Abs: 0.7 10*3/uL (ref 0.7–4.0)
MCH: 33.2 pg (ref 26.0–34.0)
MCHC: 32.5 g/dL (ref 30.0–36.0)
MCV: 102.2 fL — ABNORMAL HIGH (ref 80.0–100.0)
Monocytes Absolute: 0.3 10*3/uL (ref 0.1–1.0)
Monocytes Relative: 6 %
Neutro Abs: 3.5 10*3/uL (ref 1.7–7.7)
Neutrophils Relative %: 76 %
Platelets: 63 10*3/uL — ABNORMAL LOW (ref 150–400)
RBC: 2.29 MIL/uL — ABNORMAL LOW (ref 3.87–5.11)
RDW: 18.3 % — ABNORMAL HIGH (ref 11.5–15.5)
WBC: 4.6 10*3/uL (ref 4.0–10.5)
nRBC: 0 % (ref 0.0–0.2)

## 2022-06-07 LAB — RESP PANEL BY RT-PCR (RSV, FLU A&B, COVID)  RVPGX2
Influenza A by PCR: NEGATIVE
Influenza B by PCR: NEGATIVE
Resp Syncytial Virus by PCR: NEGATIVE
SARS Coronavirus 2 by RT PCR: NEGATIVE

## 2022-06-07 LAB — I-STAT BETA HCG BLOOD, ED (MC, WL, AP ONLY): I-stat hCG, quantitative: <5 m[IU]/mL (ref <5)

## 2022-06-07 MED ORDER — CARVEDILOL 25 MG PO TABS
25.0000 mg | ORAL_TABLET | Freq: Two times a day (BID) | ORAL | Status: DC
  Administered 2022-06-07 – 2022-06-09 (×5): 25 mg via ORAL
  Filled 2022-06-07: qty 2
  Filled 2022-06-07 (×3): qty 1
  Filled 2022-06-07: qty 2

## 2022-06-07 MED ORDER — FUROSEMIDE 10 MG/ML IJ SOLN
40.0000 mg | Freq: Once | INTRAMUSCULAR | Status: AC
  Administered 2022-06-07: 40 mg via INTRAVENOUS
  Filled 2022-06-07: qty 4

## 2022-06-07 MED ORDER — AMLODIPINE BESYLATE 10 MG PO TABS
10.0000 mg | ORAL_TABLET | Freq: Every day | ORAL | Status: DC
  Administered 2022-06-07 – 2022-06-09 (×3): 10 mg via ORAL
  Filled 2022-06-07: qty 2
  Filled 2022-06-07 (×2): qty 1

## 2022-06-07 MED ORDER — POTASSIUM CHLORIDE CRYS ER 20 MEQ PO TBCR
40.0000 meq | EXTENDED_RELEASE_TABLET | Freq: Two times a day (BID) | ORAL | Status: AC
  Administered 2022-06-07 – 2022-06-08 (×2): 40 meq via ORAL
  Filled 2022-06-07 (×2): qty 2

## 2022-06-07 MED ORDER — LEVETIRACETAM 500 MG PO TABS
1000.0000 mg | ORAL_TABLET | Freq: Every morning | ORAL | Status: DC
  Administered 2022-06-08 – 2022-06-10 (×3): 1000 mg via ORAL
  Filled 2022-06-07 (×3): qty 2

## 2022-06-07 MED ORDER — SPIRONOLACTONE 25 MG PO TABS
50.0000 mg | ORAL_TABLET | Freq: Every day | ORAL | Status: DC
  Administered 2022-06-08 – 2022-06-09 (×2): 50 mg via ORAL
  Filled 2022-06-07 (×2): qty 2

## 2022-06-07 MED ORDER — HYDRALAZINE HCL 50 MG PO TABS: 100.0000 mg | ORAL_TABLET | Freq: Three times a day (TID) | ORAL | Status: DC

## 2022-06-07 MED ORDER — HEPARIN SODIUM (PORCINE) 5000 UNIT/ML IJ SOLN
5000.0000 [IU] | Freq: Three times a day (TID) | INTRAMUSCULAR | Status: DC
  Administered 2022-06-07 – 2022-06-10 (×7): 5000 [IU] via SUBCUTANEOUS
  Filled 2022-06-07 (×7): qty 1

## 2022-06-07 MED ORDER — HYDRALAZINE HCL 50 MG PO TABS
100.0000 mg | ORAL_TABLET | Freq: Three times a day (TID) | ORAL | Status: DC
  Administered 2022-06-08 – 2022-06-09 (×5): 100 mg via ORAL
  Filled 2022-06-07: qty 4
  Filled 2022-06-07 (×4): qty 2

## 2022-06-07 MED ORDER — SEVELAMER CARBONATE 800 MG PO TABS
800.0000 mg | ORAL_TABLET | ORAL | Status: DC
  Administered 2022-06-08: 800 mg via ORAL
  Filled 2022-06-07: qty 1

## 2022-06-07 MED ORDER — CINACALCET HCL 30 MG PO TABS
30.0000 mg | ORAL_TABLET | Freq: Every day | ORAL | Status: DC
  Administered 2022-06-08 – 2022-06-09 (×2): 30 mg via ORAL
  Filled 2022-06-07 (×3): qty 1

## 2022-06-07 MED ORDER — SEVELAMER CARBONATE 800 MG PO TABS
1600.0000 mg | ORAL_TABLET | Freq: Three times a day (TID) | ORAL | Status: DC
  Administered 2022-06-08: 2400 mg via ORAL
  Administered 2022-06-08 – 2022-06-10 (×4): 1600 mg via ORAL
  Filled 2022-06-07 (×3): qty 2
  Filled 2022-06-07: qty 3
  Filled 2022-06-07 (×3): qty 2

## 2022-06-07 NOTE — ED Notes (Signed)
Patient is being discharged from the Urgent Care and sent to the Emergency Department via [place] . Per Vincente Liberty NP, patient is in need of higher level of care due to hypoxia, pulmonary edema. Patient is aware and verbalizes understanding of plan of care.  Vitals:   06/07/22 1626 06/07/22 1731  BP: 124/81   Pulse: (!) 106   Resp: (!) 24   Temp: 100.3 F (37.9 C)   SpO2: (!) 60% 97%

## 2022-06-07 NOTE — Assessment & Plan Note (Signed)
--  replete with oral potassium ?

## 2022-06-07 NOTE — ED Notes (Signed)
Placed IV per verbal order from NP [facility] pulling in now

## 2022-06-07 NOTE — ED Triage Notes (Addendum)
Recently exposed to covid, long past medical history. O2 sat 60% with good O2 pleth waveform in triage. Reports mild central chest pressure. Was dialyzed earlier today without problem

## 2022-06-07 NOTE — ED Provider Notes (Addendum)
Patient presents for evaluation of shortness of breath at rest for 2 days.  Denies chest pain or tightness, wheezing, cough, nasal or chest congestion.  Known exposure to COVID.  History of end-stage renal disease, completed dialysis today without complication.  Uses 1 to 2 L of oxygen at as needed at home, endorses has been using primarily at nighttime to help her sleep.  Has taken all daily medication as prescribed.  Patient desaturating into the 60s to 70s on room air, initially attempted 1 to 2 L of oxygen but only maintained in the 80s, titrated placed on 6 L of oxygen before able to maintain greater than 90, chest x-ray completed showing pulmonary edema and effusions, discussed with patient and therefore she has been sent to the nearest emergency department for further evaluation and management, to be transported by [facility], has fever of 100.3 in triage with associated mild tachycardia at 106, patient does not appear to be in any signs of distress nor toxic appearing.  Accompanied by family.  IV placed by nursing staff.   Hans Eden, NP 06/07/22 1715    Hans Eden, NP 06/07/22 1736

## 2022-06-07 NOTE — H&P (Signed)
History and Physical    Patient: Madeline Brown QRF:[id number] DOB: 1975-08-25 DOA: 06/07/2022 DOS: the patient was seen and examined on 06/07/2022 PCP: Sonia Side., FNP  Patient coming from: Home  Chief Complaint:  Chief Complaint  Patient presents with   Shortness of Breath   HPI: Madeline Brown is a 46 y.o. female with medical history significant of ESRD T/Th/S,  HFpEF, HTN, seizure who presents with increasing shortness of breath.   Patient reports shortness of breath for 2 days. Has dry cough but thinks it is due to ongoing tobacco use of about 5 cigarettes daily. Just finished dialysis earlier today without issues. She presented originally to urgent care and had oxygen desaturation to 60-70s and placed on 6L. CXR shows pulmonary edema and effusions. Temperature noted to be 100.26F. She was then sent to ED.   In ED, HR 90s, normotension on 6L via [place] WBC of 4.6, Hgb of 7.6 with baseline 8-9  Hypokalemia with K of 3.1, creatinine of 4.7   Negative flu/COVID/RSV.   She was given IV '40mg'$  Lasix as she still makes urine. Hospitalist consulted for admission.   Review of Systems: As mentioned in the history of present illness. All other systems reviewed and are negative. Past Medical History:  Diagnosis Date   Anemia    ESRD on HD    Headache, unspecified 09/26/2017   History of blood transfusion 07/2020   Hypertension    Intracranial hemorrhage (HCC)    LVH (left ventricular hypertrophy)    Pericardial effusion    Pulmonary hypertension ([place])    Seizure ([place]) 02/2019   Shortness of breath 07/15/2018   01/16/21 not currently   Stroke [facility])    Mild was told after having a seizure   Thrombocytopenia ([place])    Past Surgical History:  Procedure Laterality Date   AV FISTULA PLACEMENT Left 06/24/2017   Procedure: CREATION OF LEFT ARM BRACHIALCEPHALIC  ARTERIOVENOUS (AV) FISTULA;  Surgeon: Rosetta Posner, MD;  Location: [place];  Service: Vascular;  Laterality: Left;   CESAREAN  SECTION     x2   COMPLEX WOUND CLOSURE Left 07/30/2020   Procedure: COMPLEX WOUND CLOSURE;  Surgeon: Cherre Robins, MD;  Location: [place] [facility];  Service: Vascular;  Laterality: Left;   FISTULA SUPERFICIALIZATION Left 10/28/2017   Procedure: SUPERFICIALIZATION LEFT BRACHIOCEPHALIC ARTERIOVENOUS FISTULA;  Surgeon: Rosetta Posner, MD;  Location: [place];  Service: Vascular;  Laterality: Left;   INGUINAL HERNIA REPAIR Bilateral 02/01/2021   Procedure: LAPAROSCOPIC BILATERAL INGUINAL HERNIA REPAIR WITH MESH;  Surgeon: Kinsinger, Arta Bruce, MD;  Location: [facility];  Service: General;  Laterality: Bilateral;   INSERTION OF DIALYSIS CATHETER Right 06/24/2017   Procedure: INSERTION OF TUNNELED DIALYSIS CATHETER;  Surgeon: Rosetta Posner, MD;  Location: [facility];  Service: Vascular;  Laterality: Right;   INSERTION OF DIALYSIS CATHETER Right 07/30/2020   Procedure: INSERTION OF DIALYSIS CATHETER;  Surgeon: Cherre Robins, MD;  Location: [place];  Service: Vascular;  Laterality: Right;   LIGATION OF COMPETING BRANCHES OF ARTERIOVENOUS FISTULA  10/28/2017   Procedure: LIGATION OF COMPETING BRANCHES OF ARTERIOVENOUS FISTULA x3;  Surgeon: Rosetta Posner, MD;  Location: [place];  Service: Vascular;;   REVISON OF ARTERIOVENOUS FISTULA Left 07/30/2020   Procedure: LEFT UPPER EXTREMITY ARTERIOVENOUS FISTULA REVISON;  Surgeon: Cherre Robins, MD;  Location: [place];  Service: Vascular;  Laterality: Left;  PERIPHERAL NERVE BLOCK   Social History:  reports that she has been smoking cigarettes. She started  smoking about 27 years ago. She has a 13.50 pack-year smoking history. She has never used smokeless tobacco. She reports that she does not currently use alcohol. She reports current drug use. Drug: Marijuana.  Allergies  Allergen Reactions   Visine [Tetrahydrozoline Hcl] Swelling and Other (See Comments)    Eyes Swell    Family History  Problem Relation Age of Onset   Diabetes Father    Hypertension Father    Cancer Father     Heart disease Father    Aneurysm Mother    Seizures Mother     Prior to Admission medications   Medication Sig Start Date End Date Taking? Authorizing Provider  acetaminophen (TYLENOL) 500 MG tablet Take 500-1,000 mg by mouth every 6 (six) hours as needed for mild pain or headache.    [provider]  amLODipine (NORVASC) 10 MG tablet Take 1 tablet (10 mg total) by mouth at bedtime. 12/09/19 02/03/22  Harvie Heck, MD  B Complex-C-Folic Acid (DIALYVITE PO) Take 1 tablet by mouth daily with breakfast.    [provider]  carvedilol (COREG) 25 MG tablet Take 1 tablet (25 mg total) by mouth 2 (two) times daily with a meal. Patient taking differently: Take 25 mg by mouth See admin instructions. Take 25 mg by mouth at 6:30 AM and 6:00 PM on Sun/Mon/Wed/Fri and 25 mg at 11:00 AM and 8:00 PM on Tues/Thurs/Sat 12/09/19 02/03/22  Harvie Heck, MD  hydrALAZINE (APRESOLINE) 100 MG tablet Take 1 tablet (100 mg total) by mouth 3 (three) times daily. 12/09/19 01/30/23  Harvie Heck, MD  levETIRAcetam (KEPPRA) 1000 MG tablet Take 1 tablet (1,000 mg total) by mouth 2 (two) times daily. Patient taking differently: Take 1,000 mg by mouth in the morning. 12/07/20   Penumalli, Earlean Polka, MD  sevelamer carbonate (RENVELA) 800 MG tablet Take 2 tablets (1,600 mg total) by mouth 3 (three) times daily with meals. Patient taking differently: Take 800-2,400 mg by mouth See admin instructions. Take 1,600-2,400 mg by mouth three times a day with meals and 800 mg with each snack 02/21/19   Lyndal Pulley, MD  spironolactone (ALDACTONE) 50 MG tablet Take 1 tablet (50 mg total) by mouth daily. 12/09/19   Harvie Heck, MD    Physical Exam: Vitals:   06/07/22 1745 06/07/22 1825 06/07/22 1930 06/07/22 2045  BP:   112/82 120/80  Pulse:   93 92  Resp:   20 17  SpO2: 96% 96% 93% 95%   Constitutional: NAD, calm, comfortable, well-appearing young female sitting upright at the edge of the bed Eyes: lids and conjunctivae  normal ENMT: Mucous membranes are moist.  Neck: normal, supple Respiratory: clear to auscultation bilaterally, no wheezing, no crackles. Normal respiratory effort. No accessory muscle use.  Cardiovascular: Regular rate and rhythm, no murmurs / rubs / gallops. No extremity edema. Abdomen: Soft, no tenderness,  Bowel sounds positive.  Musculoskeletal: no clubbing / cyanosis. No joint deformity upper and lower extremities. Good ROM, no contractures. Normal muscle tone.  Skin: no rashes, lesions, ulcers.  Neurologic: CN 2-12 grossly intact. Strength 5/5 in all 4.  Psychiatric: Normal judgment and insight. Alert and oriented x 3. Normal mood. Data Reviewed:  See HPI  Assessment and Plan: Acute respiratory failure with hypoxia ([place]) -secondary to fluid overload in the setting of ESRD on HD and acute on chronic diastolic HF -requiring 6L on presentation -she was given IV Lasix '40mg'$  by EDP. Will need unscheduled HD session tomorrow -temperature is elevated at 100.63F,  however negative flu/COVID/RSV. Low threshold to initiate empiric antibiotics if she has true fever. Follow fever trend. - wean O2 as tolerated with goal of >92%  ESRD (end stage renal disease) ([place]) -T/Th/Sat. Last session today (Sat) -need nephrology consult tomorrow for unscheduled HD session tomorrow due to fluid overload  Acute on chronic diastolic CHF (congestive heart failure) ([place]) - given IV Lasix '40mg'$  by EDP. Will need unscheduled HD session tomorrow -continue home spirolactone  Hypokalemia -replete with oral potassium  Hypertension -normotensive -continue home Coreg, amlodipine and hydralazine  Seizure disorder ([place]) -continue Keppra daily  Anemia of chronic disease -baseline 8-9 with presenting Hgb of 7.6. Likely some heme-dilution from overload.      Advance Care Planning:   Code Status: Full Code   Consults: needs nephrology in the morning  Family Communication: none at bedside  Severity of  Illness: The appropriate patient status for this patient is INPATIENT. Inpatient status is judged to be reasonable and necessary in order to provide the required intensity of service to ensure the patient's safety. The patient's presenting symptoms, physical exam findings, and initial radiographic and laboratory data in the context of their chronic comorbidities is felt to place them at high risk for further clinical deterioration. Furthermore, it is not anticipated that the patient will be medically stable for discharge from the hospital within 2 midnights of admission.   * I certify that at the point of admission it is my clinical judgment that the patient will require inpatient hospital care spanning beyond 2 midnights from the point of admission due to high intensity of service, high risk for further deterioration and high frequency of surveillance required.*  Author: Orene Desanctis, DO 06/07/2022 9:42 PM  For on call review [email or web address].

## 2022-06-07 NOTE — Assessment & Plan Note (Signed)
-  continue Keppra daily

## 2022-06-07 NOTE — Assessment & Plan Note (Signed)
-  T/Th/Sat. Last session today (Sat) -need nephrology consult tomorrow for unscheduled HD session tomorrow due to fluid overload

## 2022-06-07 NOTE — Assessment & Plan Note (Addendum)
-   given IV Lasix '40mg'$  by EDP. Will need unscheduled HD session tomorrow -continue home spirolactone

## 2022-06-07 NOTE — ED Notes (Signed)
[facility] made aware of transport  Penelope Coop made aware patient coming to ED.

## 2022-06-07 NOTE — ED Notes (Signed)
Placed on O2, provider in room

## 2022-06-07 NOTE — Discharge Instructions (Signed)
Please go to the nearest emergency department for further evaluation and management of your shortness of breath, here in office you have been unable to maintain your O2 saturation and we have had to apply oxygen  Chest x-ray shows that there is fluid within the lung cavity and this will need further evaluation and higher level of care to manage

## 2022-06-07 NOTE — ED Triage Notes (Signed)
[facility] for SOB after dialysis earlier today. Pt reports recent Covid exposure. At [facility] pt SaO2 '@70'$ % not on 6lpm  and 96%. Pt is A&Ox4, 20G right wrist.

## 2022-06-07 NOTE — Assessment & Plan Note (Addendum)
-  secondary to fluid overload in the setting of ESRD on HD and acute on chronic diastolic HF -requiring 6L on presentation -she was given IV Lasix '40mg'$  by EDP. Will need unscheduled HD session tomorrow -temperature is elevated at 100.44F, however negative flu/COVID/RSV. Low threshold to initiate empiric antibiotics if she has true fever. Follow fever trend. - wean O2 as tolerated with goal of >92%

## 2022-06-07 NOTE — Assessment & Plan Note (Signed)
-  normotensive -continue home Coreg, amlodipine and hydralazine

## 2022-06-07 NOTE — ED Provider Notes (Cosign Needed Addendum)
[facility] EMERGENCY DEPARTMENT Provider Note   CSN: [id number] Arrival date & time: 06/07/22  1747     History  Chief Complaint  Patient presents with   Shortness of Breath    Madeline Brown is a 46 y.o. female, history of ESRD, pericardial effusion, who presents to the ED secondary to shortness of breath that is worse on exertion, for the last 2 days.  She states that she has become more progressive short of breath, and feels like she cannot breathe.  Notes that she was at dialysis today completed her dialysis session, and has been compliant with dialysis, on Tuesday, Thursday, Saturday sessions.  States that she was requiring 6 L of O2, 70% on room air, so she was put on 6 L, and bumped up to 96%.  Went to urgent care, and was found to diffuse pulmonary edema, and trace bilateral pleural effusions.  Told to come to the ER for further evaluation.  She has had to be hospitalized for this before.  Denies any chest pain, nausea, vomiting, bilateral leg swelling.  Does make urine.     Home Medications Prior to Admission medications   Medication Sig Start Date End Date Taking? Authorizing Provider  acetaminophen (TYLENOL) 500 MG tablet Take 500-1,000 mg by mouth every 6 (six) hours as needed for mild pain or headache.    [provider]  amLODipine (NORVASC) 10 MG tablet Take 1 tablet (10 mg total) by mouth at bedtime. 12/09/19 02/03/22  Harvie Heck, MD  B Complex-C-Folic Acid (DIALYVITE PO) Take 1 tablet by mouth daily with breakfast.    [provider]  carvedilol (COREG) 25 MG tablet Take 1 tablet (25 mg total) by mouth 2 (two) times daily with a meal. Patient taking differently: Take 25 mg by mouth See admin instructions. Take 25 mg by mouth at 6:30 AM and 6:00 PM on Sun/Mon/Wed/Fri and 25 mg at 11:00 AM and 8:00 PM on Tues/Thurs/Sat 12/09/19 02/03/22  Harvie Heck, MD  hydrALAZINE (APRESOLINE) 100 MG tablet Take 1 tablet (100 mg total) by mouth 3  (three) times daily. 12/09/19 01/30/23  Harvie Heck, MD  levETIRAcetam (KEPPRA) 1000 MG tablet Take 1 tablet (1,000 mg total) by mouth 2 (two) times daily. Patient taking differently: Take 1,000 mg by mouth in the morning. 12/07/20   Penumalli, Earlean Polka, MD  sevelamer carbonate (RENVELA) 800 MG tablet Take 2 tablets (1,600 mg total) by mouth 3 (three) times daily with meals. Patient taking differently: Take 800-2,400 mg by mouth See admin instructions. Take 1,600-2,400 mg by mouth three times a day with meals and 800 mg with each snack 02/21/19   Lyndal Pulley, MD  spironolactone (ALDACTONE) 50 MG tablet Take 1 tablet (50 mg total) by mouth daily. 12/09/19   Harvie Heck, MD      Allergies    Visine [tetrahydrozoline hcl]    Review of Systems   Review of Systems  Respiratory:  Positive for shortness of breath.   Cardiovascular:  Negative for chest pain.    Physical Exam Updated Vital Signs BP 120/80   Pulse 92   Resp 17   LMP 12/14/2017   SpO2 95%  Physical Exam Vitals and nursing note reviewed.  Constitutional:      General: She is not in acute distress.    Appearance: She is well-developed.  HENT:     Head: Normocephalic and atraumatic.  Eyes:     Conjunctiva/sclera: Conjunctivae normal.  Cardiovascular:     Rate  and Rhythm: Normal rate and regular rhythm.     Heart sounds: No murmur heard. Pulmonary:     Effort: Pulmonary effort is normal. No respiratory distress.     Comments: +crackles diffusely in right lobe, crackles in left lower lobe, 6L O2 Abdominal:     Palpations: Abdomen is soft.     Tenderness: There is no abdominal tenderness.  Musculoskeletal:        General: No swelling.     Cervical back: Neck supple.  Skin:    General: Skin is warm and dry.     Capillary Refill: Capillary refill takes less than 2 seconds.  Neurological:     Mental Status: She is alert.  Psychiatric:        Mood and Affect: Mood normal.     ED Results / Procedures / Treatments    Labs (all labs ordered are listed, but only abnormal results are displayed) Labs Reviewed  COMPREHENSIVE METABOLIC PANEL - Abnormal; Notable for the following components:      Result Value   Potassium 3.1 (*)    Chloride 96 (*)    Creatinine, Ser 4.56 (*)    Calcium 8.3 (*)    Total Protein 6.1 (*)    Albumin 3.0 (*)    Alkaline Phosphatase 30 (*)    Total Bilirubin 1.7 (*)    GFR, Estimated 11 (*)    All other components within normal limits  CBC WITH DIFFERENTIAL/PLATELET - Abnormal; Notable for the following components:   RBC 2.29 (*)    Hemoglobin 7.6 (*)    HCT 23.4 (*)    MCV 102.2 (*)    RDW 18.3 (*)    Platelets 63 (*)    All other components within normal limits  I-STAT CHEM 8, ED - Abnormal; Notable for the following components:   Potassium 3.1 (*)    Chloride 94 (*)    BUN 22 (*)    Creatinine, Ser 4.70 (*)    Calcium, Ion 0.95 (*)    TCO2 36 (*)    Hemoglobin 8.5 (*)    HCT 25.0 (*)    All other components within normal limits  RESP PANEL BY RT-PCR (RSV, FLU A&B, COVID)  [id number]  I-STAT BETA HCG BLOOD, ED ([facility], [facility], [facility] ONLY)    EKG EKG Interpretation  Date/Time:  Saturday June 07 2022 18:06:44 EST Ventricular Rate:  96 PR Interval:  204 QRS Duration: 83 QT Interval:  390 QTC Calculation: 493 R Axis:   77 Text Interpretation: Sinus rhythm Borderline prolonged PR interval Biatrial enlargement Borderline prolonged QT interval No significant change was found Confirmed by Ezequiel Essex [phone]) on 06/07/2022 6:14:40 PM  Radiology DG Chest 2 View  Result Date: 06/07/2022 CLINICAL DATA:  Shortness of breath EXAM: CHEST - 2 VIEW COMPARISON:  Chest x-ray dated February 04, 2022 FINDINGS: Unchanged cardiomegaly enlarged main pulmonary arteries. Diffuse interstitial opacities, worsened when compared with the prior exam. New trace bilateral pleural effusions. No evidence of pneumothorax. IMPRESSION: 1. Cardiomegaly and pulmonary edema. 2. Trace bilateral  pleural effusions. Electronically Signed   By: Yetta Glassman M.D.   On: 06/07/2022 16:55    Procedures Procedures    Medications Ordered in ED Medications  furosemide (LASIX) injection 40 mg (40 mg Intravenous Given 06/07/22 1835)    ED Course/ Medical Decision Making/ A&P                           Medical  Decision Making Patient is a 46 year old female, history of ESRD, CHF, here for shortness of breath, that is been going on for the last 2 days.  Reports that it is exertional.  Is requiring 6 L of O2.  She did have a chest x-ray done outpatient, at urgent care, showed trace bilateral pleural effusions, and diffuse pulmonary edema.  We obtain EKG, CBC, CMP, for further evaluation.  She does make urine as well, so we will give her IV Lasix.  Additionally COVID/flu given shortness of breath.  Amount and/or Complexity of Data Reviewed Labs: ordered.    Details: No acute findings.  Hemoglobin 7.6 likely secondary to kidney function ECG/medicine tests: ordered.    Details: EKG sinus rhythm. Discussion of management or test interpretation with external provider(s): Discussed with patient, she is requiring 6 L of O2, was given IV Lasix, with little improvement, she does have diffuse pulmonary edema, she will require further dialysis, and of possible IV Lasix.  Admitted to Dr. Flossie Buffy for further evaluation, and treatment.    Risk Prescription drug management. Decision regarding hospitalization.   Final Clinical Impression(s) / ED Diagnoses Final diagnoses:  Acute pulmonary edema [facility])    Rx / DC Orders ED Discharge Orders     None         Tanyah Debruyne, Si Gaul, PA 06/07/22 2107    Osvaldo Shipper, [place] 06/07/22 2108    Ezequiel Essex, MD 06/08/22 0107

## 2022-06-07 NOTE — Assessment & Plan Note (Signed)
-  baseline 8-9 with presenting Hgb of 7.6. Likely some heme-dilution from overload.

## 2022-06-07 NOTE — Assessment & Plan Note (Signed)
>>  ASSESSMENT AND PLAN FOR ACUTE RESPIRATORY FAILURE WITH HYPOXIA (HCC) WRITTEN ON 06/07/2022  9:40 PM BY TU, CHING T, DO  -secondary to fluid overload in the setting of ESRD on HD and acute on chronic diastolic HF -requiring 6L on presentation -she was given IV Lasix  40mg  by EDP. Will need unscheduled HD session tomorrow -temperature is elevated at 100.44F, however negative flu/COVID/RSV. Low threshold to initiate empiric antibiotics if she has true fever. Follow fever trend. - wean O2 as tolerated with goal of >92%

## 2022-06-07 NOTE — ED Notes (Signed)
After pt arrived, was placed on non-rebreather, O2 came up to 100%. NP evaluated patient, placed on nasal cannula at 1 L and sent for chest x-ray. On return from radiology, O2 sat at 64% on 1 L O2. Titrated O2 until patient's sat was 90%, now patient on 6L O2. NP notified. Patient appears comfortable at rest.

## 2022-06-08 ENCOUNTER — Encounter (HOSPITAL_COMMUNITY): Payer: Self-pay | Admitting: Family Medicine

## 2022-06-08 ENCOUNTER — Other Ambulatory Visit: Payer: Self-pay

## 2022-06-08 DIAGNOSIS — N186 End stage renal disease: Secondary | ICD-10-CM | POA: Diagnosis not present

## 2022-06-08 DIAGNOSIS — J9601 Acute respiratory failure with hypoxia: Secondary | ICD-10-CM | POA: Diagnosis not present

## 2022-06-08 DIAGNOSIS — I5033 Acute on chronic diastolic (congestive) heart failure: Secondary | ICD-10-CM | POA: Diagnosis not present

## 2022-06-08 LAB — RENAL FUNCTION PANEL
Albumin: 3 g/dL — ABNORMAL LOW (ref 3.5–5.0)
Anion gap: 11 (ref 5–15)
BUN: 45 mg/dL — ABNORMAL HIGH (ref 6–20)
CO2: 30 mmol/L (ref 22–32)
Calcium: 8.7 mg/dL — ABNORMAL LOW (ref 8.9–10.3)
Chloride: 94 mmol/L — ABNORMAL LOW (ref 98–111)
Creatinine, Ser: 6.79 mg/dL — ABNORMAL HIGH (ref 0.44–1.00)
GFR, Estimated: 7 mL/min — ABNORMAL LOW (ref >60)
Glucose, Bld: 91 mg/dL (ref 70–99)
Phosphorus: 4.2 mg/dL (ref 2.5–4.6)
Potassium: 4.6 mmol/L (ref 3.5–5.1)
Sodium: 135 mmol/L (ref 135–145)

## 2022-06-08 LAB — BASIC METABOLIC PANEL
Anion gap: 12 (ref 5–15)
BUN: 33 mg/dL — ABNORMAL HIGH (ref 6–20)
CO2: 31 mmol/L (ref 22–32)
Calcium: 8.4 mg/dL — ABNORMAL LOW (ref 8.9–10.3)
Chloride: 95 mmol/L — ABNORMAL LOW (ref 98–111)
Creatinine, Ser: 5.85 mg/dL — ABNORMAL HIGH (ref 0.44–1.00)
GFR, Estimated: 9 mL/min — ABNORMAL LOW (ref >60)
Glucose, Bld: 101 mg/dL — ABNORMAL HIGH (ref 70–99)
Potassium: 3.9 mmol/L (ref 3.5–5.1)
Sodium: 138 mmol/L (ref 135–145)

## 2022-06-08 LAB — CBC
HCT: 22.3 % — ABNORMAL LOW (ref 36.0–46.0)
Hemoglobin: 7 g/dL — ABNORMAL LOW (ref 12.0–15.0)
MCH: 32.4 pg (ref 26.0–34.0)
MCHC: 31.4 g/dL (ref 30.0–36.0)
MCV: 103.2 fL — ABNORMAL HIGH (ref 80.0–100.0)
Platelets: 59 10*3/uL — ABNORMAL LOW (ref 150–400)
RBC: 2.16 MIL/uL — ABNORMAL LOW (ref 3.87–5.11)
RDW: 18.3 % — ABNORMAL HIGH (ref 11.5–15.5)
WBC: 4.9 10*3/uL (ref 4.0–10.5)
nRBC: 0 % (ref 0.0–0.2)

## 2022-06-08 LAB — HEPATITIS B SURFACE ANTIGEN: Hepatitis B Surface Ag: NONREACTIVE

## 2022-06-08 MED ORDER — DARBEPOETIN ALFA 200 MCG/0.4ML IJ SOSY
200.0000 ug | PREFILLED_SYRINGE | Freq: Once | INTRAMUSCULAR | Status: DC
  Filled 2022-06-08: qty 0.4

## 2022-06-08 MED ORDER — DOXERCALCIFEROL 0.5 MCG PO CAPS
2.0000 ug | ORAL_CAPSULE | ORAL | Status: DC
  Administered 2022-06-08: 2 ug via ORAL
  Filled 2022-06-08: qty 4

## 2022-06-08 MED ORDER — SODIUM CHLORIDE 0.9% IV SOLUTION: Freq: Once | INTRAVENOUS | Status: DC

## 2022-06-08 MED ORDER — CHLORHEXIDINE GLUCONATE CLOTH 2 % EX PADS
6.0000 | MEDICATED_PAD | Freq: Every day | CUTANEOUS | Status: DC
  Administered 2022-06-09 – 2022-06-10 (×2): 6 via TOPICAL

## 2022-06-08 NOTE — ED Notes (Signed)
RN attempted to give report but RN name listed is not pulling up. 3E NS stated to give her a second to fix.

## 2022-06-08 NOTE — ED Notes (Signed)
RN adjusted pt Danielson to 6L d/t pt O2 88-89 with steady pleth. Pt extremities are warm to touch and VSS.

## 2022-06-08 NOTE — ED Notes (Signed)
RN called lab to add Hepatitis B

## 2022-06-08 NOTE — ED Notes (Signed)
RN applied Hi-Flo nasal canula pt O2 93%%

## 2022-06-08 NOTE — ED Notes (Signed)
RN noticed pt O2 73% from nursing station. Upon entering room pt had remove nasal canula d/t nostrils feeling dry. Pt requested nonrebreather because she feels her nose will start bleeding d/t dryness. RN placed nonrebreather at 15L.   RN contacted RT to get Hi-flo canula or humidifier flow started.

## 2022-06-08 NOTE — Progress Notes (Signed)
Received patient in bed to unit.  Alert and oriented.  Informed consent signed and in chart.   Treatment initiated: 8138 Treatment completed: 1915  Patient tolerated well. Given 1 unit blood. No reaction symptoms noted  Transported back to the room  Alert, without acute distress.  Hand-off given to patient's nurse.   Access used: AVF Access issues: None  Total UF removed: 2.8L Medication(s) given: None Post HD VS: 115/75,88,24,98.0 Post HD weight: 50.7kg   [facility]

## 2022-06-08 NOTE — ED Notes (Signed)
Pt visitor is placing pt belongings in two bags with labels.

## 2022-06-08 NOTE — Progress Notes (Cosign Needed Addendum)
[facility] Renal Consultation Note    Indication for Consultation:  Management of ESRD/hemodialysis; anemia, hypertension/volume and secondary hyperparathyroidism PCP: Dustin Folks FNP Nephrologist: Dr. Joylene Grapes  HPI: Madeline Brown is a 46 y.o. female with ESRD on hemodialysis T, Th,S a [facility]. PMH: HTN, CVA, HFpEF, Seizure disorder, anemia, SHPT. Last HD 06/07/2022. She ran full treatment, left 0.3 kg under EDW, has been under EDW last 2 treatments. No missed treatments this past week.   Seen in ED with her husband present. She tells me that she went to Urgent Care with C/O SOB, DOE. CXR showed pulmonary edema, small bilateral pleural effusions. She was sent to ED for further evaluation She denies orthopnea. Says her EDW hasn't been lowered in quite some time and she says she thinks she needs EDW lowered. Seen in ED. O2 sats 95% on 6 L/M [place]. Says she was SOB walking to bathroom in ED on portable o2. She has been admitted for acute hypoxic respiratory failure. Will have HD today off schedule, lower volume as tolerated.   Of note: Last OP HGB 9.5 06/05/2022. Noted to be 7.0 here. Discussed with primary. She has not mentioned dark or tarry stools, acute blood loss. Will transfuse on HD today if possible.    Past Medical History:  Diagnosis Date   Anemia    ESRD on HD    Headache, unspecified 09/26/2017   History of blood transfusion 07/2020   Hypertension    Intracranial hemorrhage (HCC)    LVH (left ventricular hypertrophy)    Pericardial effusion    Pulmonary hypertension ([place])    Seizure ([place]) 02/2019   Shortness of breath 07/15/2018   01/16/21 not currently   Stroke [facility])    Mild was told after having a seizure   Thrombocytopenia ([place])    Past Surgical History:  Procedure Laterality Date   AV FISTULA PLACEMENT Left 06/24/2017   Procedure: CREATION OF LEFT ARM BRACHIALCEPHALIC  ARTERIOVENOUS (AV) FISTULA;  Surgeon: Rosetta Posner, MD;  Location: [facility];  Service: Vascular;  Laterality: Left;   CESAREAN SECTION     x2   COMPLEX WOUND CLOSURE Left 07/30/2020   Procedure: COMPLEX WOUND CLOSURE;  Surgeon: Cherre Robins, MD;  Location: [facility];  Service: Vascular;  Laterality: Left;   FISTULA SUPERFICIALIZATION Left 10/28/2017   Procedure: SUPERFICIALIZATION LEFT BRACHIOCEPHALIC ARTERIOVENOUS FISTULA;  Surgeon: Rosetta Posner, MD;  Location: [facility];  Service: Vascular;  Laterality: Left;   INGUINAL HERNIA REPAIR Bilateral 02/01/2021   Procedure: LAPAROSCOPIC BILATERAL INGUINAL HERNIA REPAIR WITH MESH;  Surgeon: Kinsinger, Arta Bruce, MD;  Location: [facility];  Service: General;  Laterality: Bilateral;   INSERTION OF DIALYSIS CATHETER Right 06/24/2017   Procedure: INSERTION OF TUNNELED DIALYSIS CATHETER;  Surgeon: Rosetta Posner, MD;  Location: [facility];  Service: Vascular;  Laterality: Right;   INSERTION OF DIALYSIS CATHETER Right 07/30/2020   Procedure: INSERTION OF DIALYSIS CATHETER;  Surgeon: Cherre Robins, MD;  Location[facility];  Service: Vascular;  Laterality: Right;   LIGATION OF COMPETING BRANCHES OF ARTERIOVENOUS FISTULA  10/28/2017   Procedure: LIGATION OF COMPETING BRANCHES OF ARTERIOVENOUS FISTULA x3;  Surgeon: Rosetta Posner, MD;  Location: [facility];  Service: Vascular;;   REVISON OF ARTERIOVENOUS FISTULA Left 07/30/2020   Procedure: LEFT UPPER EXTREMITY ARTERIOVENOUS FISTULA REVISON;  Surgeon: Cherre Robins, MD;  Location[facility];  Service: Vascular;  Laterality: Left;  PERIPHERAL NERVE BLOCK   Family History  Problem Relation Age  of Onset   Diabetes Father    Hypertension Father    Cancer Father    Heart disease Father    Aneurysm Mother    Seizures Mother    Social History:  reports that she has been smoking cigarettes. She started smoking about 27 years ago. She has a 13.50 pack-year smoking history. She has never used smokeless tobacco. She reports that she does not currently use alcohol. She reports current drug use. Drug:  Marijuana. Allergies  Allergen Reactions   Visine [Tetrahydrozoline Hcl] Swelling and Other (See Comments)    Eyes Swell   Prior to Admission medications   Medication Sig Start Date End Date Taking? Authorizing Provider  acetaminophen (TYLENOL) 500 MG tablet Take 500-1,000 mg by mouth every 6 (six) hours as needed for mild pain or headache.   Yes [provider]  amLODipine (NORVASC) 10 MG tablet Take 1 tablet (10 mg total) by mouth at bedtime. 12/09/19 06/08/23 Yes Aslam, Loralyn Freshwater, MD  B Complex-C-Folic Acid (DIALYVITE PO) Take 1 tablet by mouth daily with breakfast.   Yes [provider]  carvedilol (COREG) 25 MG tablet Take 1 tablet (25 mg total) by mouth 2 (two) times daily with a meal. Patient taking differently: Take 25 mg by mouth See admin instructions. Take 25 mg by mouth at 6:30 AM and 6:00 PM on Sun/Mon/Wed/Fri and 25 mg at 11:00 AM and 8:00 PM on Tues/Thurs/Sat 12/09/19 06/08/23 Yes Aslam, Sadia, MD  cinacalcet (SENSIPAR) 30 MG tablet Take 30 mg by mouth daily.   Yes [provider]  hydrALAZINE (APRESOLINE) 100 MG tablet Take 1 tablet (100 mg total) by mouth 3 (three) times daily. 12/09/19 01/30/23 Yes Aslam, Loralyn Freshwater, MD  levETIRAcetam (KEPPRA) 1000 MG tablet Take 1 tablet (1,000 mg total) by mouth 2 (two) times daily. Patient taking differently: Take 1,000 mg by mouth in the morning. 12/07/20  Yes Penumalli, Earlean Polka, MD  sevelamer carbonate (RENVELA) 800 MG tablet Take 2 tablets (1,600 mg total) by mouth 3 (three) times daily with meals. Patient taking differently: Take 800-2,400 mg by mouth See admin instructions. Take 1,600-2,400 mg by mouth three times a day with meals and 800 mg with each snack 02/21/19  Yes Lyndal Pulley, MD  spironolactone (ALDACTONE) 50 MG tablet Take 1 tablet (50 mg total) by mouth daily. 12/09/19  Yes Harvie Heck, MD   Current Facility-Administered Medications  Medication Dose Route Frequency Provider Last Rate Last Admin   amLODipine  (NORVASC) tablet 10 mg  10 mg Oral QHS Tu, Ching T, DO   10 mg at 06/07/22 2222   carvedilol (COREG) tablet 25 mg  25 mg Oral BID Tu, Ching T, DO   25 mg at 06/08/22 0922   [START ON 06/09/2022] Chlorhexidine Gluconate Cloth 2 % PADS 6 each  6 each Topical Q0600 Valentina Gu, NP       cinacalcet [facility]) tablet 30 mg  30 mg Oral Q breakfast Tu, Ching T, DO   30 mg at 06/08/22 0841   doxercalciferol (HECTOROL) capsule 2 mcg  2 mcg Oral Q T,Th,Sat-1800 Valentina Gu, NP       heparin injection 5,000 Units  5,000 Units Subcutaneous Q8H Tu, Ching T, DO   5,000 Units at 06/08/22 9326   hydrALAZINE (APRESOLINE) tablet 100 mg  100 mg Oral TID Tu, Ching T, DO   100 mg at 06/08/22 7124   levETIRAcetam (KEPPRA) tablet 1,000 mg  1,000 mg Oral q AM Tu, Ching T, DO  1,000 mg at 06/08/22 9924   sevelamer carbonate (RENVELA) tablet 1,600-2,400 mg  1,600-2,400 mg Oral TID with meals Tu, Ching T, DO   1,600 mg at 06/08/22 2683   sevelamer carbonate (RENVELA) tablet 800 mg  800 mg Oral With snacks Tu, Ching T, DO       spironolactone (ALDACTONE) tablet 50 mg  50 mg Oral Daily Tu, Ching T, DO   50 mg at 06/08/22 4196   Current Outpatient Medications  Medication Sig Dispense Refill   acetaminophen (TYLENOL) 500 MG tablet Take 500-1,000 mg by mouth every 6 (six) hours as needed for mild pain or headache.     amLODipine (NORVASC) 10 MG tablet Take 1 tablet (10 mg total) by mouth at bedtime. 30 tablet 0   B Complex-C-Folic Acid (DIALYVITE PO) Take 1 tablet by mouth daily with breakfast.     carvedilol (COREG) 25 MG tablet Take 1 tablet (25 mg total) by mouth 2 (two) times daily with a meal. (Patient taking differently: Take 25 mg by mouth See admin instructions. Take 25 mg by mouth at 6:30 AM and 6:00 PM on Sun/Mon/Wed/Fri and 25 mg at 11:00 AM and 8:00 PM on Tues/Thurs/Sat) 60 tablet 0   cinacalcet (SENSIPAR) 30 MG tablet Take 30 mg by mouth daily.     hydrALAZINE (APRESOLINE) 100 MG tablet Take 1  tablet (100 mg total) by mouth 3 (three) times daily. 90 tablet 0   levETIRAcetam (KEPPRA) 1000 MG tablet Take 1 tablet (1,000 mg total) by mouth 2 (two) times daily. (Patient taking differently: Take 1,000 mg by mouth in the morning.) 60 tablet 1   sevelamer carbonate (RENVELA) 800 MG tablet Take 2 tablets (1,600 mg total) by mouth 3 (three) times daily with meals. (Patient taking differently: Take 800-2,400 mg by mouth See admin instructions. Take 1,600-2,400 mg by mouth three times a day with meals and 800 mg with each snack) 30 tablet 0   spironolactone (ALDACTONE) 50 MG tablet Take 1 tablet (50 mg total) by mouth daily. 30 tablet 0   Labs: Basic Metabolic Panel: Recent Labs  Lab 06/07/22 1812 06/07/22 1939 06/08/22 0217  NA 139 137 138  K 3.1* 3.1* 3.9  CL 96* 94* 95*  CO2 31  --  31  GLUCOSE 87 83 101*  BUN 20 22* 33*  CREATININE 4.56* 4.70* 5.85*  CALCIUM 8.3*  --  8.4*   Liver Function Tests: Recent Labs  Lab 06/07/22 1812  AST 16  ALT 10  ALKPHOS 30*  BILITOT 1.7*  PROT 6.1*  ALBUMIN 3.0*   No results for input(s): "LIPASE", "AMYLASE" in the last 168 hours. No results for input(s): "AMMONIA" in the last 168 hours. CBC: Recent Labs  Lab 06/07/22 1812 06/07/22 1939 06/08/22 0217  WBC 4.6  --  4.9  NEUTROABS 3.5  --   --   HGB 7.6* 8.5* 7.0*  HCT 23.4* 25.0* 22.3*  MCV 102.2*  --  103.2*  PLT 63*  --  59*   Cardiac Enzymes: No results for input(s): "CKTOTAL", "CKMB", "CKMBINDEX", "TROPONINI" in the last 168 hours. CBG: No results for input(s): "GLUCAP" in the last 168 hours. Iron Studies: No results for input(s): "IRON", "TIBC", "TRANSFERRIN", "FERRITIN" in the last 72 hours. Studies/Results: DG Chest 2 View  Result Date: 06/07/2022 CLINICAL DATA:  Shortness of breath EXAM: CHEST - 2 VIEW COMPARISON:  Chest x-ray dated February 04, 2022 FINDINGS: Unchanged cardiomegaly enlarged main pulmonary arteries. Diffuse interstitial opacities, worsened when compared  with  the prior exam. New trace bilateral pleural effusions. No evidence of pneumothorax. IMPRESSION: 1. Cardiomegaly and pulmonary edema. 2. Trace bilateral pleural effusions. Electronically Signed   By: Yetta Glassman M.D.   On: 06/07/2022 16:55    ROS: As per HPI otherwise negative.   Physical Exam: Vitals:   06/08/22 1058 06/08/22 1100 06/08/22 1107 06/08/22 1130  BP:  124/83  119/89  Pulse: 94 91 89 88  Resp: '13 19 20 16  '$ Temp:      TempSrc:      SpO2: 92% 99% 96% 93%  Weight:      Height:         General: Well developed, well nourished, in no acute distress. Head: Normocephalic, atraumatic, sclera non-icteric, mucus membranes are moist Neck: Supple. JVD not elevated. Lungs: Bilateral breath sounds with few bibasilar crackles. No WOB.  Heart: S1,S2 No M/R/G. SR on monitor rate 90s Abdomen: Soft, NT. NABS M-S:  Strength and tone appear normal for age. Lower extremities: No LE edema Neuro: Alert and oriented X 3. Moves all extremities spontaneously. Psych:  Responds to questions appropriately with a normal affect. Dialysis Access: L AVF hugely aneurysmal +T/B  Dialysis Orders: Center: [place] 3:45 hrs 180NRe 350/Autoflow 1.5 51.5 kg 2.0 K/2.0 Ca AVF  - No heparin  - Mircera 225 mcg IV q 2 weeks (last dose 05/27/2022 due 06/10/2022) - Venofer 50 mg IV weekly (last dose 06/05/2022) - Hectorol 2 mcg IV TIW  Assessment/Plan:  Acute hypoxic respiratory failure: HD off schedule today. She is not overtly volume overloaded by exam but CXR shows pulmonary edema/small bilateral pleural effusions present. Lower volume as tolerated. HGB 7.0. Transfuse 1 unit PRBCS today.   Hypokalemia: Rec'd a total of Kdur 80 MEQ per primary. Recheck K+ prior to HD.   ESRD -  T,Th,S Next HD 06/10/2022.   Hypertension/volume  - BP controlled. Continue home antihypertensive meds. Volume as noted in #1  Anemia  - HGB 7.0. HGB 9.5 Tsat 20% 06/05/2022 at OP center. Denies ABL. No tarry/bloody stools  reported. Transfuse 1 unit PRBCS today. Start Fe load. ESA due 06/10/22.   Metabolic bone disease -  Continue sensipar, renvela binders, VDRA  Nutrition - Renal diet. Protein supplements.    Rita H. Owens Shark, NP-C 06/08/2022, 12:58 PM  [employer] [phone]  I have seen and examined this patient and agree with plan and assessment in the above note with renal recommendations/intervention highlighted.  Pt reports having increasing DOE and SOB over the past week and thought that her dry weight needed to be decreased but didn't say anything, now with hypoxia.  Plan for another session of HD and UF as tolerated with new, lower edw. [place] John A Robinette Esters,MD 06/09/2022 12:07 AM

## 2022-06-08 NOTE — Progress Notes (Addendum)
[facility] PROGRESS NOTE  Madeline Brown (DOB: 1975-12-22) [id number] PCP: Sonia Side., FNP  Brief Narrative: Madeline Brown is a 46 y.o. female with a history of ESRD (HD TTS), HFpEF, HTN, tobacco use, seizure disorder who presented to the ED on 06/07/2022 with shortness of breath having been found to be hypoxemic at urgent care. CXR revealed pulmonary edema and pleural effusions. In the ED, placed on 6L O2, given IV lasix and admitted with plans for hemodialysis.  Subjective: Suspects that her EDW should be lowered. Has nonproductive cough preceding symptoms, doesn't have subjective fever or chills.   Objective: BP 124/83   Pulse 89   Temp 98.6 F (37 C) (Oral)   Resp 20   Ht 5' (1.524 m)   Wt 51.4 kg   LMP 12/14/2017   SpO2 96%   BMI 22.13 kg/m   Gen: No distress with HFNC in nose Pulm: Crackles at bases no wheezing, nonlabored  CV: RRR, no MRG.  GI: Soft, NT, ND, +BS  Neuro: Alert and oriented. No new focal deficits. Ext: Warm, no deformities. +thrill LUE AVF Skin: No rashes, lesions or ulcers on visualized skin   Assessment & Plan: Acute hypoxic respiratory failure: CXR consistent with pulmonary edema, pleural effusions/acute on chronic HFpEF as the cause, has had similar episodes in the past, though hasn't gone way above EDW or endorsed any new salt loading, etc. Had 100.26F at urgent care (normal since without intervention), no leukocytosis or lobar infiltrate on CXR.  - Nephrology, Dr. Hollie Salk, consulted for off-schedule HD today.  - If does not resolve with dialysis, would need to pursue further work up.  Acute on chronic HFpEF:  - Manage volume with HD as above  ESRD:  - Extra HD today, appreciate nephrology assistance. TTS usual schedule.   Anemia of ESRD:  - Hgb is at 7, certainly contributing to dyspnea. Will get type & screen, may get blood with HD. - ESA per nephrology. Monitor for bleeding  Hypokalemia: Resolved, supplemented, will not repeat  supplement.   HTN:  - Continue norvasc, coreg, hydralazine  Thrombocytopenia: Chronic.   Seizure disorder: Quiescent currently - Continue keppra   Patrecia Pour, MD [facility] [email or web address] 06/08/2022, 11:21 AM

## 2022-06-08 NOTE — ED Notes (Signed)
Pt was wondering on the status for HD tx. RN informed pt waiting for consult to nephrology to make the decisions on her tx

## 2022-06-08 NOTE — Progress Notes (Signed)
Blood admin completed, VS stable. No symptoms of reaction. Will continue to monitor and give report to receiving floor.

## 2022-06-09 ENCOUNTER — Inpatient Hospital Stay (HOSPITAL_COMMUNITY): Payer: Medicare HMO

## 2022-06-09 DIAGNOSIS — J9601 Acute respiratory failure with hypoxia: Secondary | ICD-10-CM | POA: Diagnosis not present

## 2022-06-09 DIAGNOSIS — N186 End stage renal disease: Secondary | ICD-10-CM | POA: Diagnosis not present

## 2022-06-09 DIAGNOSIS — I5033 Acute on chronic diastolic (congestive) heart failure: Secondary | ICD-10-CM | POA: Diagnosis not present

## 2022-06-09 LAB — CBC
HCT: 26.7 % — ABNORMAL LOW (ref 36.0–46.0)
Hemoglobin: 8.9 g/dL — ABNORMAL LOW (ref 12.0–15.0)
MCH: 32.6 pg (ref 26.0–34.0)
MCHC: 33.3 g/dL (ref 30.0–36.0)
MCV: 97.8 fL (ref 80.0–100.0)
Platelets: 68 10*3/uL — ABNORMAL LOW (ref 150–400)
RBC: 2.73 MIL/uL — ABNORMAL LOW (ref 3.87–5.11)
RDW: 18.6 % — ABNORMAL HIGH (ref 11.5–15.5)
WBC: 6 10*3/uL (ref 4.0–10.5)
nRBC: 0 % (ref 0.0–0.2)

## 2022-06-09 LAB — BPAM RBC
Blood Product Expiration Date: 202401102359
ISSUE DATE / TIME: 202312241721
Unit Type and Rh: 7300

## 2022-06-09 LAB — TYPE AND SCREEN
ABO/RH(D): B POS
Antibody Screen: NEGATIVE
Unit division: 0

## 2022-06-09 MED ORDER — DARBEPOETIN ALFA 200 MCG/0.4ML IJ SOSY
200.0000 ug | PREFILLED_SYRINGE | Freq: Once | INTRAMUSCULAR | Status: DC
  Filled 2022-06-09: qty 0.4

## 2022-06-09 MED ORDER — ACETAMINOPHEN 325 MG PO TABS
650.0000 mg | ORAL_TABLET | Freq: Four times a day (QID) | ORAL | Status: DC | PRN
  Administered 2022-06-09: 650 mg via ORAL
  Filled 2022-06-09: qty 2

## 2022-06-09 MED ORDER — METHYLPREDNISOLONE SODIUM SUCC 125 MG IJ SOLR
125.0000 mg | Freq: Once | INTRAMUSCULAR | Status: AC
  Administered 2022-06-09: 125 mg via INTRAVENOUS
  Filled 2022-06-09: qty 2

## 2022-06-09 MED ORDER — DIPHENHYDRAMINE HCL 50 MG/ML IJ SOLN
25.0000 mg | Freq: Once | INTRAMUSCULAR | Status: AC
  Administered 2022-06-09: 25 mg via INTRAVENOUS
  Filled 2022-06-09: qty 1

## 2022-06-09 MED ORDER — CHLORHEXIDINE GLUCONATE CLOTH 2 % EX PADS
6.0000 | MEDICATED_PAD | Freq: Every day | CUTANEOUS | Status: DC
  Administered 2022-06-10: 6 via TOPICAL

## 2022-06-09 MED ORDER — IOHEXOL 350 MG/ML SOLN
75.0000 mL | Freq: Once | INTRAVENOUS | Status: AC | PRN
  Administered 2022-06-09: 75 mL via INTRAVENOUS

## 2022-06-09 NOTE — Progress Notes (Signed)
Overnight event  Notified by RN that patient's neck is swollen after eating dinner.  Patient immediately seen and examined at bedside.  Resting comfortably, speaking clearly in full sentences.  No respiratory distress.  Noted to have swelling of the submandibular region bilaterally.  No swelling of tongue or drooling.  I am easily able to visualize her oropharynx.  Lungs clear to auscultation, no wheezing.  Heart rate 95, blood pressure 124/82.  She has been on supplemental oxygen throughout day shift and weaned down to 1 L and currently satting 100%.  No signs of anaphylaxis. Dinner tray at bedside had seasoned chicken, rice, and broccoli with cheese sauce.  Patient told me she has consumed these food items in the past except never tried chicken with this type of seasoning.  Unknown what type of ingredients are in the chicken seasoning.  States she started eating dinner around 6:45 PM and after a few bites of the chicken noticed that her neck started swelling.  She denies any difficulty breathing, chest pain, or dizziness.  Denies difficulty swallowing. I have placed orders for Solu-Medrol 125 mg x 1 and IV Benadryl 25 mg.  Advised RN to monitor very closely for signs of anaphylaxis or airway compromise and notify me immediately if there is any concern.

## 2022-06-09 NOTE — Plan of Care (Signed)
  Problem: Activity: Goal: Risk for activity intolerance will decrease Outcome: Completed/Met   Problem: Nutrition: Goal: Adequate nutrition will be maintained Outcome: Completed/Met   Problem: Coping: Goal: Level of anxiety will decrease Outcome: Completed/Met   Problem: Elimination: Goal: Will not experience complications related to urinary retention Outcome: Completed/Met   Problem: Pain Managment: Goal: General experience of comfort will improve Outcome: Completed/Met   Problem: Safety: Goal: Ability to remain free from injury will improve Outcome: Completed/Met   Problem: Skin Integrity: Goal: Risk for impaired skin integrity will decrease Outcome: Completed/Met

## 2022-06-09 NOTE — Plan of Care (Signed)
Pt had BM today. CT angio performed. Pt schedule for dialysis tomorrow.  Problem: Education: Goal: Knowledge of General Education information will improve Description: Including pain rating scale, medication(s)/side effects and non-pharmacologic comfort measures Outcome: Progressing   Problem: Health Behavior/Discharge Planning: Goal: Ability to manage health-related needs will improve Outcome: Progressing   Problem: Clinical Measurements: Goal: Ability to maintain clinical measurements within normal limits will improve Outcome: Progressing Goal: Will remain free from infection Outcome: Progressing Goal: Diagnostic test results will improve Outcome: Progressing Goal: Respiratory complications will improve Outcome: Progressing Goal: Cardiovascular complication will be avoided Outcome: Progressing   Problem: Elimination: Goal: Will not experience complications related to bowel motility Outcome: Progressing

## 2022-06-09 NOTE — Progress Notes (Signed)
[facility] PROGRESS NOTE  Madeline Brown (DOB: 01/21/1976) [id number] PCP: Sonia Side., FNP  Brief Narrative: Madeline Brown is a 46 y.o. female with a history of ESRD (HD TTS), HFpEF, HTN, tobacco use, seizure disorder who presented to the ED on 06/07/2022 with shortness of breath having been found to be hypoxemic at urgent care. CXR revealed pulmonary edema and pleural effusions. In the ED, placed on 6L O2, given IV lasix and admitted with plans for hemodialysis. HD 12/24 with some improvement in dyspnea though remains hypoxic. CTA chest ordered.   Subjective: Feels just generally unwell. Having low grade temp this morning. Denies urinary symptoms, no new cough, no rashes/wounds/joint aches. No sore throat, rhinorrhea/URI symptoms. Still short of breath even at rest.   Objective: BP 125/88   Pulse 92   Temp 98.5 F (36.9 C) (Oral)   Resp 18   Ht 5' (1.524 m)   Wt 50.7 kg   LMP 12/14/2017   SpO2 94%   BMI 21.83 kg/m   Gen: No distress  Pulm: Crackles at bases, no rhonchi or wheezing, tachypneic   CV: Regular borderline tachycardia, no edema + thrill LUE AVF GI: Soft, NT, ND, +BS  Neuro: Alert and oriented. No new focal deficits. Ext: Warm, no deformities Skin: No rashes, lesions or ulcers on visualized skin   Assessment & Plan: Acute hypoxic respiratory failure: CXR consistent with pulmonary edema, pleural effusions/acute on chronic HFpEF as the cause, has had similar episodes in the past, though hasn't gone way above EDW or endorsed any new salt loading, etc. Had 100.73F at urgent care (normal since without intervention), no leukocytosis or lobar infiltrate on CXR.  - Continues to have hypoxia. Low grade temperature, not technically febrile, but concern for pneumonia needs ruled out. Still with tachycardia and hypoxia. R/o PE. CTA chest, pt ESRD, ordered. Continue efforts at volume removal if tolerated.   Acute on chronic HFpEF:  - Manage volume with HD as  above  ESRD:  - Extra HD today, appreciate nephrology assistance. TTS usual schedule. Got extra HD 12/24. May need additional Tx.   Anemia of ESRD:  - Hgb up with transfusion as anticipated.  - ESA per nephrology. Monitor for bleeding  Hypokalemia: Resolved, supplemented, will not repeat supplement.   HTN:  - Continue norvasc, coreg, hydralazine  Thrombocytopenia: Chronic. Stable.    Seizure disorder: Quiescent currently - Continue keppra   Patrecia Pour, MD [facility] [email or web address] 06/09/2022, 9:29 AM

## 2022-06-09 NOTE — Progress Notes (Signed)
[facility] Progress Note  Subjective: seen in room, now on 2L [place], was on 8L [place]. 2.8 L off w/ HD yest.   Vitals:   06/09/22 0730 06/09/22 0845 06/09/22 0851 06/09/22 1200  BP: 125/88   117/81  Pulse: 92   91  Resp: 18   17  Temp: 98.5 F (36.9 C)   99 F (37.2 C)  TempSrc: Oral   Oral  SpO2: 97% (!) 83% 94% 94%  Weight:      Height:        Exam:  alert, nad   no jvd  Chest cta bilat  Cor reg no RG  Abd soft ntnd no ascites   Ext no LE edema   Alert, NF, ox3    L AVF hugely aneurysmal +T/B   OP HD: [facility] [place] 3h 85mn  350/1.5   51.5 kg     2/2 bath  L AVF  Hep none - Mircera 225 mcg IV q 2 weeks (last dose 05/27/2022 due 06/10/2022) - Venofer 50 mg IV weekly (last dose 06/05/2022) - Hectorol 2 mcg IV TIW   Assessment/Plan:  Acute hypoxic respiratory failure/ pulm edema: CXR shows pulmonary edema/small bilateral pleural effusions. 2.8 L off w/ HD Sunday. Pt states has lost appetite the last month. Cont to lower dry wt w/ HD tomorrow am, wean O2 as possible.   Hypokalemia: Rec'd a total of Kdur 80 MEQ per primary.   ESRD - TTS HD. Next HD 12/26, lower vol further.   Hypertension/volume  - BP controlled. Continue home antihypertensive meds. Volume as noted in #1  Anemia  - HGB 7.0. HGB 9.5 Tsat 20% 06/05/2022 at OP center. Denies ABL. No tarry/bloody stools reported. Transfused 1 unit PRBCS.  ESA due 12/26.   Metabolic bone disease -  Continue sensipar, renvela binders, VDRA  Nutrition - Renal diet. Protein supplements.     Rob Nikya Busler 06/09/2022, 12:02 PM   Recent Labs  Lab 06/07/22 1812 06/07/22 1939 06/08/22 0217 06/08/22 1347 06/08/22 2208  HGB 7.6*   < > 7.0*  --  8.9*  ALBUMIN 3.0*  --   --  3.0*  --   CALCIUM 8.3*  --  8.4* 8.7*  --   PHOS  --   --   --  4.2  --   CREATININE 4.56*   < > 5.85* 6.79*  --   K 3.1*   < > 3.9 4.6  --    < > = values in this interval not displayed.   No results for input(s): "IRON", "TIBC", "FERRITIN" in the  last 168 hours. Inpatient medications:  sodium chloride   Intravenous Once   amLODipine  10 mg Oral QHS   carvedilol  25 mg Oral BID   Chlorhexidine Gluconate Cloth  6 each Topical Q0600   cinacalcet  30 mg Oral Q breakfast   [START ON 06/10/2022] darbepoetin (ARANESP) injection - DIALYSIS  200 mcg Subcutaneous Once   doxercalciferol  2 mcg Oral Q T,Th,Sat-1800   heparin  5,000 Units Subcutaneous Q8H   hydrALAZINE  100 mg Oral TID   levETIRAcetam  1,000 mg Oral q AM   sevelamer carbonate  1,600-2,400 mg Oral TID with meals   sevelamer carbonate  800 mg Oral With snacks   spironolactone  50 mg Oral Daily    acetaminophen

## 2022-06-09 NOTE — Progress Notes (Signed)
New throat/face swelling noticed in pt. No pain, swallowing, or breathing issues noted. Provider Rathore notified and came to bedside. See new orders.

## 2022-06-10 DIAGNOSIS — J9601 Acute respiratory failure with hypoxia: Secondary | ICD-10-CM | POA: Diagnosis not present

## 2022-06-10 LAB — RENAL FUNCTION PANEL
Albumin: 3.3 g/dL — ABNORMAL LOW (ref 3.5–5.0)
Anion gap: 13 (ref 5–15)
BUN: 55 mg/dL — ABNORMAL HIGH (ref 6–20)
CO2: 27 mmol/L (ref 22–32)
Calcium: 9.6 mg/dL (ref 8.9–10.3)
Chloride: 92 mmol/L — ABNORMAL LOW (ref 98–111)
Creatinine, Ser: 7.06 mg/dL — ABNORMAL HIGH (ref 0.44–1.00)
GFR, Estimated: 7 mL/min — ABNORMAL LOW (ref >60)
Glucose, Bld: 120 mg/dL — ABNORMAL HIGH (ref 70–99)
Phosphorus: 4.2 mg/dL (ref 2.5–4.6)
Potassium: 5.1 mmol/L (ref 3.5–5.1)
Sodium: 132 mmol/L — ABNORMAL LOW (ref 135–145)

## 2022-06-10 LAB — CBC
HCT: 28.3 % — ABNORMAL LOW (ref 36.0–46.0)
Hemoglobin: 9.4 g/dL — ABNORMAL LOW (ref 12.0–15.0)
MCH: 32.4 pg (ref 26.0–34.0)
MCHC: 33.2 g/dL (ref 30.0–36.0)
MCV: 97.6 fL (ref 80.0–100.0)
Platelets: 100 10*3/uL — ABNORMAL LOW (ref 150–400)
RBC: 2.9 MIL/uL — ABNORMAL LOW (ref 3.87–5.11)
RDW: 17.8 % — ABNORMAL HIGH (ref 11.5–15.5)
WBC: 6.1 10*3/uL (ref 4.0–10.5)
nRBC: 0 % (ref 0.0–0.2)

## 2022-06-10 LAB — HEPATITIS B SURFACE ANTIBODY, QUANTITATIVE: Hep B S AB Quant (Post): 292.7 m[IU]/mL (ref >9.9)

## 2022-06-10 MED ORDER — LIDOCAINE-PRILOCAINE 2.5-2.5 % EX CREA: 1.0000 | TOPICAL_CREAM | CUTANEOUS | Status: DC | PRN

## 2022-06-10 MED ORDER — NEPRO/CARBSTEADY PO LIQD: 237.0000 mL | ORAL | Status: DC | PRN

## 2022-06-10 MED ORDER — SODIUM CHLORIDE 0.9 % IV SOLN
125.0000 mg | INTRAVENOUS | Status: DC
  Filled 2022-06-10: qty 10

## 2022-06-10 MED ORDER — PENTAFLUOROPROP-TETRAFLUOROETH EX AERO: 1.0000 | INHALATION_SPRAY | CUTANEOUS | Status: DC | PRN

## 2022-06-10 MED ORDER — LIDOCAINE HCL (PF) 1 % IJ SOLN: 5.0000 mL | INTRAMUSCULAR | Status: DC | PRN

## 2022-06-10 MED ORDER — DARBEPOETIN ALFA 100 MCG/0.5ML IJ SOSY
100.0000 ug | PREFILLED_SYRINGE | Freq: Once | INTRAMUSCULAR | Status: AC
  Administered 2022-06-10: 100 ug via SUBCUTANEOUS
  Filled 2022-06-10: qty 0.5

## 2022-06-10 NOTE — Discharge Summary (Signed)
Physician Discharge Summary   Patient: Madeline Brown MRN: [id number] DOB: 07-08-1975  Admit date:     06/07/2022  Discharge date: 06/10/22  Discharge Physician: Patrecia Pour   PCP: Sonia Side., FNP   Recommendations at discharge:   Follow up with nephrology, routine HD to resume Thursday (got HD 12/26 prior to discharge). EDW lowered during hospitalization with improved respiratory status, hypoxia resolution.   Discharge Diagnoses: Active Problems:   Acute respiratory failure with hypoxia (HCC)   ESRD (end stage renal disease) (HCC)   Hypokalemia   Acute on chronic diastolic CHF (congestive heart failure) (HCC)   Hypertension   Seizure disorder ([place])   Anemia of chronic disease   Hospital Course: Madeline Brown is a 46 y.o. female with a history of ESRD (HD TTS), HFpEF, HTN, tobacco use, seizure disorder who presented to the ED on 06/07/2022 with shortness of breath having been found to be hypoxemic at urgent care. CXR revealed pulmonary edema and pleural effusions. In the ED, placed on 6L O2, given IV lasix and admitted with plans for hemodialysis. HD 12/24 with some improvement in dyspnea though remained hypoxemic. CTA chest showed no pneumonia or PE, though continued pulmonary edema. HD repeated 12/26 with resolution of hypoxemia. Pt stable to discharge.   Assessment and Plan: Acute hypoxic respiratory failure: CXR consistent with pulmonary edema, pleural effusions/acute on chronic HFpEF as the cause. - Continued to have hypoxia which resolved with HD 12/26.     Acute on chronic HFpEF:  - Manage volume with HD as above   ESRD:  - Appreciate nephrology assistance. TTS usual schedule. Got extra HD 12/24, again on 12/26. Weight at DC 108.47lbs (49.2kg)   Anemia of ESRD:  - Hgb up with transfusion as anticipated. IV iron was to be provided but not given due to pt urgency to leave. Suggest giving this at HD. - ESA per nephrology. Monitor for bleeding   Hypokalemia:  Resolved, supplemented, will not repeat supplement.    HTN:  - Continue norvasc, coreg, hydralazine   Thrombocytopenia: Chronic. Stable.     Seizure disorder: Quiescent currently - Continue keppra   Throat swelling: external bilateral neck swelling noted after eating seasoned chicken that resolved. No evidence of airway involvement at any time, given steroids and benadryl. Urged to avoid those ingredients.  Consultants: Nephrology Procedures performed: HD  Disposition: Home Diet recommendation:  Renal diet DISCHARGE MEDICATION: Allergies as of 06/10/2022       Reactions   Visine [tetrahydrozoline Hcl] Swelling, Other (See Comments)   Eyes Swell        Medication List     TAKE these medications    acetaminophen 500 MG tablet Commonly known as: TYLENOL Take 500-1,000 mg by mouth every 6 (six) hours as needed for mild pain or headache.   amLODipine 10 MG tablet Commonly known as: NORVASC Take 1 tablet (10 mg total) by mouth at bedtime.   carvedilol 25 MG tablet Commonly known as: COREG Take 1 tablet (25 mg total) by mouth 2 (two) times daily with a meal. What changed:  when to take this additional instructions   cinacalcet 30 MG tablet Commonly known as: SENSIPAR Take 30 mg by mouth daily.   DIALYVITE PO Take 1 tablet by mouth daily with breakfast.   hydrALAZINE 100 MG tablet Commonly known as: APRESOLINE Take 1 tablet (100 mg total) by mouth 3 (three) times daily.   levETIRAcetam 1000 MG tablet Commonly known as: KEPPRA Take 1 tablet (1,000 mg  total) by mouth 2 (two) times daily. What changed: when to take this   sevelamer carbonate 800 MG tablet Commonly known as: RENVELA Take 2 tablets (1,600 mg total) by mouth 3 (three) times daily with meals. What changed:  how much to take when to take this additional instructions   spironolactone 50 MG tablet Commonly known as: ALDACTONE Take 1 tablet (50 mg total) by mouth daily.        Follow-up  Information     Sonia Side., FNP Follow up.   Specialty: Family Medicine Contact information: [place] [id number] [phone]                Discharge Exam: Danley Danker Weights   06/09/22 0415 06/10/22 0512 06/10/22 0746  Weight: 50.7 kg 51.1 kg 50.3 kg  BP 101/74   Pulse (!) 105   Temp 98.5 F (36.9 C) (Oral)   Resp (!) 26   Ht 5' (1.524 m)   Wt 49.2 kg   LMP 12/14/2017   SpO2 93%   BMI 21.18 kg/m   Nontoxic, well-appearing, pleasant female in no distress receiving HD Clear, nonlabored RRR, no edema +BS, NT  Condition at discharge: stable  The results of significant diagnostics from this hospitalization (including imaging, microbiology, ancillary and laboratory) are listed below for reference.   Imaging Studies: CT Angio Chest Pulmonary Embolism (PE) W or WO Contrast  Result Date: 06/09/2022 CLINICAL DATA:  Pulmonary embolism suspected, high probability EXAM: CT ANGIOGRAPHY CHEST WITH CONTRAST TECHNIQUE: Multidetector CT imaging of the chest was performed using the standard protocol during bolus administration of intravenous contrast. Multiplanar CT image reconstructions and MIPs were obtained to evaluate the vascular anatomy. RADIATION DOSE REDUCTION: This exam was performed according to the departmental dose-optimization program which includes automated exposure control, adjustment of the mA and/or kV according to patient size and/or use of iterative reconstruction technique. CONTRAST:  65m OMNIPAQUE IOHEXOL 350 MG/ML SOLN COMPARISON:  02/02/2022 FINDINGS: Cardiovascular: Satisfactory opacification of the pulmonary arteries to the segmental level. No evidence of pulmonary embolism. Enlarged heart size. No pericardial effusion. Atheromatous calcification of the aorta and coronaries. Large left upper extremity vessels likely related to upper extremity dialysis shunt. Mediastinum/Nodes: No acute finding Lungs/Pleura: Streaky opacity in the lower lungs.  Diffuse ground-glass density with centrilobular ground-glass nodules. Mild interlobular septal thickening at the bases. Upper Abdomen: No acute finding. Partially covered polycystic kidneys. Musculoskeletal: No acute finding Review of the MIP images confirms the above findings. IMPRESSION: 1. Negative for pulmonary embolism. 2. Cardiomegaly and generalized airspace disease favoring edema over pneumonia. Electronically Signed   By: JJorje GuildM.D.   On: 06/09/2022 08:31   DG Chest 2 View  Result Date: 06/07/2022 CLINICAL DATA:  Shortness of breath EXAM: CHEST - 2 VIEW COMPARISON:  Chest x-ray dated February 04, 2022 FINDINGS: Unchanged cardiomegaly enlarged main pulmonary arteries. Diffuse interstitial opacities, worsened when compared with the prior exam. New trace bilateral pleural effusions. No evidence of pneumothorax. IMPRESSION: 1. Cardiomegaly and pulmonary edema. 2. Trace bilateral pleural effusions. Electronically Signed   By: LYetta GlassmanM.D.   On: 06/07/2022 16:55    Microbiology: Results for orders placed or performed during the hospital encounter of 06/07/22  Resp panel by RT-PCR (RSV, Flu A&B, Covid) Anterior Nasal Swab     Status: None   Collection Time: 06/07/22  6:40 PM   Specimen: Anterior Nasal Swab  Result Value Ref Range Status   SARS Coronavirus 2 by RT PCR NEGATIVE  NEGATIVE Final    Comment: (NOTE) SARS-CoV-2 target nucleic acids are NOT DETECTED.  The SARS-CoV-2 RNA is generally detectable in upper respiratory specimens during the acute phase of infection. The lowest concentration of SARS-CoV-2 viral copies this assay can detect is 138 copies/mL. A negative result does not preclude SARS-Cov-2 infection and should not be used as the sole basis for treatment or other patient management decisions. A negative result may occur with  improper specimen collection/handling, submission of specimen other than nasopharyngeal swab, presence of viral mutation(s) within  the areas targeted by this assay, and inadequate number of viral copies(<138 copies/mL). A negative result must be combined with clinical observations, patient history, and epidemiological information. The expected result is Negative.  Fact Sheet for Patients:  EntrepreneurPulse.com.au  Fact Sheet for Healthcare Providers:  IncredibleEmployment.be  This test is no t yet approved or cleared by the [place] FDA and  has been authorized for detection and/or diagnosis of SARS-CoV-2 by FDA under an Emergency Use Authorization (EUA). This EUA will remain  in effect (meaning this test can be used) for the duration of the COVID-19 declaration under Section 564(b)(1) of the Act, 21 U.S.C.section 360bbb-3(b)(1), unless the authorization is terminated  or revoked sooner.       Influenza A by PCR NEGATIVE NEGATIVE Final   Influenza B by PCR NEGATIVE NEGATIVE Final    Comment: (NOTE) The Xpert Xpress SARS-CoV-2/FLU/RSV plus assay is intended as an aid in the diagnosis of influenza from Nasopharyngeal swab specimens and should not be used as a sole basis for treatment. Nasal washings and aspirates are unacceptable for Xpert Xpress SARS-CoV-2/FLU/RSV testing.  Fact Sheet for Patients: EntrepreneurPulse.com.au  Fact Sheet for Healthcare Providers: IncredibleEmployment.be  This test is not yet approved or cleared by the [place] FDA and has been authorized for detection and/or diagnosis of SARS-CoV-2 by FDA under an Emergency Use Authorization (EUA). This EUA will remain in effect (meaning this test can be used) for the duration of the COVID-19 declaration under Section 564(b)(1) of the Act, 21 U.S.C. section 360bbb-3(b)(1), unless the authorization is terminated or revoked.     Resp Syncytial Virus by PCR NEGATIVE NEGATIVE Final    Comment: (NOTE) Fact Sheet for  Patients: EntrepreneurPulse.com.au  Fact Sheet for Healthcare Providers: IncredibleEmployment.be  This test is not yet approved or cleared by the [place] FDA and has been authorized for detection and/or diagnosis of SARS-CoV-2 by FDA under an Emergency Use Authorization (EUA). This EUA will remain in effect (meaning this test can be used) for the duration of the COVID-19 declaration under Section 564(b)(1) of the Act, 21 U.S.C. section 360bbb-3(b)(1), unless the authorization is terminated or revoked.  Performed at [facility], [place] [address], [place], [place] [address]     Labs: CBC: Recent Labs  Lab 06/07/22 1812 06/07/22 1939 06/08/22 0217 06/08/22 2208 06/10/22 0056  WBC 4.6  --  4.9 6.0 6.1  NEUTROABS 3.5  --   --   --   --   HGB 7.6* 8.5* 7.0* 8.9* 9.4*  HCT 23.4* 25.0* 22.3* 26.7* 28.3*  MCV 102.2*  --  103.2* 97.8 97.6  PLT 63*  --  59* 68* 497*   Basic Metabolic Panel: Recent Labs  Lab 06/07/22 1812 06/07/22 1939 06/08/22 0217 06/08/22 1347 06/10/22 0056  NA 139 137 138 135 132*  K 3.1* 3.1* 3.9 4.6 5.1  CL 96* 94* 95* 94* 92*  CO2 31  --  '31 30 27  '$ GLUCOSE  87 83 101* 91 120*  BUN 20 22* 33* 45* 55*  CREATININE 4.56* 4.70* 5.85* 6.79* 7.06*  CALCIUM 8.3*  --  8.4* 8.7* 9.6  PHOS  --   --   --  4.2 4.2   Liver Function Tests: Recent Labs  Lab 06/07/22 1812 06/08/22 1347 06/10/22 0056  AST 16  --   --   ALT 10  --   --   ALKPHOS 30*  --   --   BILITOT 1.7*  --   --   PROT 6.1*  --   --   ALBUMIN 3.0* 3.0* 3.3*   CBG: No results for input(s): "GLUCAP" in the last 168 hours.  Discharge time spent: greater than 30 minutes.  Signed: Patrecia Pour, MD [facility] 06/10/2022

## 2022-06-10 NOTE — Progress Notes (Signed)
D/C order noted. Contacted [facility] to advise clinic of pt's d/c today and that pt should resume care on Thursday.   Melven Sartorius Renal Navigator [phone]

## 2022-06-10 NOTE — Progress Notes (Signed)
[facility] Progress Note   Subjective: Seen on HD. Still with O2 requirement. Attempting UF 2.5 today.   Objective Vitals:   06/10/22 0746 06/10/22 0756 06/10/22 0829 06/10/22 0900  BP: 117/77 117/82 116/80 106/76  Pulse: 90 88 93 100  Resp: (!) 30 (!) 25 (!) 22 (!) 23  Temp: 98.3 F (36.8 C)     TempSrc:      SpO2: 90% 92% 92% 92%  Weight: 50.3 kg     Height:       General: Well developed, well nourished, in no acute distress. Head: Normocephalic, atraumatic, sclera non-icteric, mucus membranes are moist Neck: Supple. JVD not elevated. Lungs: Bilateral breath sounds with few bibasilar crackles. No WOB.  Heart: S1,S2 No M/R/G. SR/ST on monitor rate 90s-110s Abdomen: Soft, NT. NABS M-S:  Strength and tone appear normal for age. Lower extremities: No LE edema Neuro: Alert and oriented X 3. Moves all extremities spontaneously. Psych:  Responds to questions appropriately with a normal affect. Dialysis Access: L AVF hugely aneurysmal +T/B   Additional Objective Labs: Basic Metabolic Panel: Recent Labs  Lab 06/08/22 0217 06/08/22 1347 06/10/22 0056  NA 138 135 132*  K 3.9 4.6 5.1  CL 95* 94* 92*  CO2 '31 30 27  '$ GLUCOSE 101* 91 120*  BUN 33* 45* 55*  CREATININE 5.85* 6.79* 7.06*  CALCIUM 8.4* 8.7* 9.6  PHOS  --  4.2 4.2   Liver Function Tests: Recent Labs  Lab 06/07/22 1812 06/08/22 1347 06/10/22 0056  AST 16  --   --   ALT 10  --   --   ALKPHOS 30*  --   --   BILITOT 1.7*  --   --   PROT 6.1*  --   --   ALBUMIN 3.0* 3.0* 3.3*   No results for input(s): "LIPASE", "AMYLASE" in the last 168 hours. CBC: Recent Labs  Lab 06/07/22 1812 06/07/22 1939 06/08/22 0217 06/08/22 2208 06/10/22 0056  WBC 4.6  --  4.9 6.0 6.1  NEUTROABS 3.5  --   --   --   --   HGB 7.6*   < > 7.0* 8.9* 9.4*  HCT 23.4*   < > 22.3* 26.7* 28.3*  MCV 102.2*  --  103.2* 97.8 97.6  PLT 63*  --  59* 68* 100*   < > = values in this interval not displayed.   Blood  Culture    Component Value Date/Time   SDES BLOOD RIGHT WRIST 08/07/2020 0044   SPECREQUEST  08/07/2020 0044    BOTTLES DRAWN AEROBIC AND ANAEROBIC Blood Culture adequate volume   CULT  08/07/2020 0044    NO GROWTH 5 DAYS Performed at [facility], [employer] [address] [place], [place] [address]    REPTSTATUS 08/12/2020 FINAL 08/07/2020 0044    Cardiac Enzymes: No results for input(s): "CKTOTAL", "CKMB", "CKMBINDEX", "TROPONINI" in the last 168 hours. CBG: No results for input(s): "GLUCAP" in the last 168 hours. Iron Studies: No results for input(s): "IRON", "TIBC", "TRANSFERRIN", "FERRITIN" in the last 72 hours. '@lablastinr3'$ @ Studies/Results: CT Angio Chest Pulmonary Embolism (PE) W or WO Contrast  Result Date: 06/09/2022 CLINICAL DATA:  Pulmonary embolism suspected, high probability EXAM: CT ANGIOGRAPHY CHEST WITH CONTRAST TECHNIQUE: Multidetector CT imaging of the chest was performed using the standard protocol during bolus administration of intravenous contrast. Multiplanar CT image reconstructions and MIPs were obtained to evaluate the vascular anatomy. RADIATION DOSE REDUCTION: This exam was performed according to the departmental dose-optimization program  which includes automated exposure control, adjustment of the mA and/or kV according to patient size and/or use of iterative reconstruction technique. CONTRAST:  92m OMNIPAQUE IOHEXOL 350 MG/ML SOLN COMPARISON:  02/02/2022 FINDINGS: Cardiovascular: Satisfactory opacification of the pulmonary arteries to the segmental level. No evidence of pulmonary embolism. Enlarged heart size. No pericardial effusion. Atheromatous calcification of the aorta and coronaries. Large left upper extremity vessels likely related to upper extremity dialysis shunt. Mediastinum/Nodes: No acute finding Lungs/Pleura: Streaky opacity in the lower lungs. Diffuse ground-glass density with centrilobular ground-glass nodules. Mild interlobular septal thickening at  the bases. Upper Abdomen: No acute finding. Partially covered polycystic kidneys. Musculoskeletal: No acute finding Review of the MIP images confirms the above findings. IMPRESSION: 1. Negative for pulmonary embolism. 2. Cardiomegaly and generalized airspace disease favoring edema over pneumonia. Electronically Signed   By: JJorje GuildM.D.   On: 06/09/2022 08:31   Medications:   sodium chloride   Intravenous Once   amLODipine  10 mg Oral QHS   carvedilol  25 mg Oral BID   Chlorhexidine Gluconate Cloth  6 each Topical Q0600   Chlorhexidine Gluconate Cloth  6 each Topical Q0600   cinacalcet  30 mg Oral Q breakfast   darbepoetin (ARANESP) injection - DIALYSIS  200 mcg Subcutaneous Once   doxercalciferol  2 mcg Oral Q T,Th,Sat-1800   heparin  5,000 Units Subcutaneous Q8H   hydrALAZINE  100 mg Oral TID   levETIRAcetam  1,000 mg Oral q AM   sevelamer carbonate  1,600-2,400 mg Oral TID with meals   sevelamer carbonate  800 mg Oral With snacks   spironolactone  50 mg Oral Daily     Dialysis Orders: Center: [place] 3:45 hrs 180NRe 350/Autoflow 1.5 51.5 kg 2.0 K/2.0 Ca AVF  - No heparin  - Mircera 225 mcg IV q 2 weeks (last dose 05/27/2022 due 06/10/2022) - Venofer 50 mg IV weekly (last dose 06/05/2022) - Hectorol 2 mcg IV TIW   Assessment/Plan:  Acute hypoxic respiratory failure: CXR shows pulmonary edema/small bilateral pleural effusions present. HD off schedule 06/08/2022. HD today on schedule. Arrived under OP EDW 50.3 kg.   Hypokalemia: Rec'd a total of Kdur 80 MEQ per primary. Recheck K+ prior to HD. Now K+ elevated. Will manage. Use 2.0 K bath.   ESRD -  T,Th,S Next HD 06/10/2022.   Hypertension/volume  - BP controlled. Continue home antihypertensive meds. Volume as noted in #1  Anemia  - HGB 7.0. HGB 9.5 Tsat 20% 06/05/2022 at OP center. Transfused 1 unit PRBCS 06/08/2022. Start Fe load. ESA due 06/10/22. Give Aranesp 100 mcg SQ.   Metabolic bone disease -  Continue sensipar,  renvela binders, VDRA  Nutrition - Renal diet. Protein supplements.   Keriann Rankin H. Ellijah Leffel NP-C 06/10/2022, 9:28 AM  [phone]

## 2022-06-10 NOTE — Progress Notes (Signed)
Received patient in bed ,before the start of HD treatment.Awake ,alert and oriented  x 4 ,with oxygen at 1 lpm/[place].No Sob noted.Vitals stable.  Access used :Left upper arm fistula that works well during treatment ,aneurism noted.  Duration of treatment :3 hours.  Medicine given : None  Fluid removed : 2,000 cc  Hemodialysis treatment issue :Unable to meet the fluid prescribed goal of 3.5 liters and increasing heart rate thus  2 liters was removed.          Ferric gluconate not yet in the unit.  Hands off to the patient's nurse.

## 2022-06-10 NOTE — Plan of Care (Signed)
  Problem: Education: Goal: Knowledge of General Education information will improve Description: Including pain rating scale, medication(s)/side effects and non-pharmacologic comfort measures Outcome: Progressing   Problem: Health Behavior/Discharge Planning: Goal: Ability to manage health-related needs will improve Outcome: Progressing   Problem: Clinical Measurements: Goal: Ability to maintain clinical measurements within normal limits will improve Outcome: Progressing Goal: Will remain free from infection Outcome: Progressing Goal: Diagnostic test results will improve Outcome: Progressing Goal: Respiratory complications will improve Outcome: Progressing Goal: Cardiovascular complication will be avoided Outcome: Progressing   Problem: Elimination: Goal: Will not experience complications related to bowel motility Outcome: Progressing

## 2022-06-26 NOTE — H&P (View-Only) (Signed)
Cardiology Office Note   Date:  07/04/2022   ID:  [name], DOB 1976/06/08, MRN [id number]  PCP:  Sonia Side., FNP  Cardiologist:   Jenkins Rouge, MD   No chief complaint on file.     History of Present Illness: Madeline Brown is a 47 y.o. female who presents for post hospital f/u D/c 06/10/22 with volume overload and CHF with hypoxia. Previously seen for pericardial effusion from uremia/minoxidil  that has cleared on subsequent TTE;s She has CRF on dialysis with large LUE fistula   Hospitalized 12/07/19 with HTN emergency ICH and seizures BP meds adjusted and started on Keppra She was non compliant with her meds and presented with pulmonary edema   She is being followed for transplant at [facility] Had stress test in October that was fine  Has 5 kids can help and she has better support system  Most recent hospitalization 12/23-26/23 CHF with volume overload CTA chest no pneumonia or PE Improved with more dialysis D/c weight around 108 lbs had transfusion for anemia and left before any iv iron given Hct as low as 22.3 BNP was only 344   Echo 02/04/22 EF 60-65% severe LVH moderate bi atrial enlargement no significant valve dx   Discussed doing right heart cath to measure CO Discussed with renal I am concerned with high output CHF due to enormous fistula She has a loud continuous flow murmur on exam  Risks of right heart cath including bleeding discussed Willing to proceed Will arrange next week on non dialysis day    Past Medical History:  Diagnosis Date   Anemia    ESRD on HD    Headache, unspecified 09/26/2017   History of blood transfusion 07/2020   Hypertension    Intracranial hemorrhage (HCC)    LVH (left ventricular hypertrophy)    Pericardial effusion    Pulmonary hypertension ([place])    Seizure ([place]) 02/2019   Shortness of breath 07/15/2018   01/16/21 not currently   Stroke ([place])    Mild was told after having a seizure   Thrombocytopenia ([place])     Past  Surgical History:  Procedure Laterality Date   AV FISTULA PLACEMENT Left 06/24/2017   Procedure: CREATION OF LEFT ARM BRACHIALCEPHALIC  ARTERIOVENOUS (AV) FISTULA;  Surgeon: Rosetta Posner, MD;  Location: [place];  Service: Vascular;  Laterality: Left;   CESAREAN SECTION     x2   COMPLEX WOUND CLOSURE Left 07/30/2020   Procedure: COMPLEX WOUND CLOSURE;  Surgeon: Cherre Robins, MD;  Location: [place];  Service: Vascular;  Laterality: Left;   FISTULA SUPERFICIALIZATION Left 10/28/2017   Procedure: SUPERFICIALIZATION LEFT BRACHIOCEPHALIC ARTERIOVENOUS FISTULA;  Surgeon: Rosetta Posner, MD;  Location: [place];  Service: Vascular;  Laterality: Left;   INGUINAL HERNIA REPAIR Bilateral 02/01/2021   Procedure: LAPAROSCOPIC BILATERAL INGUINAL HERNIA REPAIR WITH MESH;  Surgeon: Kinsinger, Arta Bruce, MD;  Location: [facility];  Service: General;  Laterality: Bilateral;   INSERTION OF DIALYSIS CATHETER Right 06/24/2017   Procedure: INSERTION OF TUNNELED DIALYSIS CATHETER;  Surgeon: Rosetta Posner, MD;  Location: [facility];  Service: Vascular;  Laterality: Right;   INSERTION OF DIALYSIS CATHETER Right 07/30/2020   Procedure: INSERTION OF DIALYSIS CATHETER;  Surgeon: Cherre Robins, MD;  Location: [facility];  Service: Vascular;  Laterality: Right;   LIGATION OF COMPETING BRANCHES OF ARTERIOVENOUS FISTULA  10/28/2017   Procedure: LIGATION OF COMPETING BRANCHES OF ARTERIOVENOUS FISTULA x3;  Surgeon: Rosetta Posner, MD;  Location: [facility];  Service: Vascular;;   REVISON OF ARTERIOVENOUS FISTULA Left 07/30/2020   Procedure: LEFT UPPER EXTREMITY ARTERIOVENOUS FISTULA REVISON;  Surgeon: Cherre Robins, MD;  Location: [facility];  Service: Vascular;  Laterality: Left;  PERIPHERAL NERVE BLOCK     Current Outpatient Medications  Medication Sig Dispense Refill   acetaminophen (TYLENOL) 500 MG tablet Take 500-1,000 mg by mouth every 6 (six) hours as needed for mild pain or headache.     amLODipine (NORVASC) 10 MG tablet Take 1 tablet (10 mg  total) by mouth at bedtime. 30 tablet 0   B Complex-C-Folic Acid (DIALYVITE PO) Take 1 tablet by mouth daily with breakfast.     carvedilol (COREG) 25 MG tablet Take 1 tablet (25 mg total) by mouth 2 (two) times daily with a meal. (Patient taking differently: Take 25 mg by mouth See admin instructions. Take 25 mg by mouth at 6:30 AM and 6:00 PM on Sun/Mon/Wed/Fri and 25 mg at 11:00 AM and 8:00 PM on Tues/Thurs/Sat) 60 tablet 0   cinacalcet (SENSIPAR) 30 MG tablet Take 30 mg by mouth daily.     hydrALAZINE (APRESOLINE) 100 MG tablet Take 1 tablet (100 mg total) by mouth 3 (three) times daily. 90 tablet 0   levETIRAcetam (KEPPRA) 1000 MG tablet Take 1 tablet (1,000 mg total) by mouth 2 (two) times daily. (Patient taking differently: Take 1,000 mg by mouth in the morning.) 60 tablet 1   sevelamer carbonate (RENVELA) 800 MG tablet Take 2 tablets (1,600 mg total) by mouth 3 (three) times daily with meals. (Patient taking differently: Take 800-2,400 mg by mouth See admin instructions. Take 1,600-2,400 mg by mouth three times a day with meals and 800 mg with each snack) 30 tablet 0   spironolactone (ALDACTONE) 50 MG tablet Take 1 tablet (50 mg total) by mouth daily. 30 tablet 0   No current facility-administered medications for this visit.    Allergies:   Visine [tetrahydrozoline hcl]    Social History:  The patient  reports that she has been smoking cigarettes. She started smoking about 28 years ago. She has a 13.50 pack-year smoking history. She has never used smokeless tobacco. She reports that she does not currently use alcohol. She reports current drug use. Drug: Marijuana.   Family History:  The patient's family history includes Aneurysm in her mother; Cancer in her father; Diabetes in her father; Heart disease in her father; Hypertension in her father; Seizures in her mother.    ROS:  Please see the history of present illness.   Otherwise, review of systems are positive for none.   All other  systems are reviewed and negative.    PHYSICAL EXAM: VS:  BP 124/60   Pulse 94   Ht 5' (1.524 m)   Wt 109 lb 12.8 oz (49.8 kg)   LMP 12/14/2017   SpO2 (!) 88%   BMI 21.44 kg/m  , BMI Body mass index is 21.44 kg/m. Affect appropriate Chronically ill black female  HEENT: normal Neck supple with no adenopathy JVP normal no bruits no thyromegaly Lungs clear with no wheezing and good diaphragmatic motion Heart:  S1/S2 continuous shunt murmur, no rub, gallop or click PMI normal Abdomen: benighn, BS positve, no tenderness, no AAA no bruit.  No HSM or HJR Distal pulses intact with no bruits No edema Neuro non-focal Skin warm and dry No muscular weakness LUE fistula is gigantic especially for her size loud thrills    EKG:  07/12/18 SR LAE  LVH low voltage    Recent Labs: 02/02/2022: B Natriuretic Peptide 344.6 02/04/2022: Magnesium 2.3 06/07/2022: ALT 10 06/10/2022: BUN 55; Creatinine, Ser 7.06; Hemoglobin 9.4; Platelets 100; Potassium 5.1; Sodium 132    Lipid Panel    Component Value Date/Time   CHOL 172 03/23/2019 0839   TRIG 55 03/23/2019 0839   HDL 93 03/23/2019 0839   CHOLHDL 1.8 03/23/2019 0839   CHOLHDL 2.7 02/21/2019 0425   VLDL 10 02/21/2019 0425   LDLCALC 68 03/23/2019 0839      Wt Readings from Last 3 Encounters:  07/04/22 109 lb 12.8 oz (49.8 kg)  06/10/22 108 lb 7.5 oz (49.2 kg)  02/04/22 110 lb 0.2 oz (49.9 kg)      Other studies Reviewed: Additional studies/ records that were reviewed today include: Notes from hospitalization January 2020 [facility] consult Note labs CTA, CXR and TTE .    ASSESSMENT AND PLAN:  1.  Pericardial effusion: likely related to uremia/minoxidil . Resolved on f/u echo 12/08/19 and 02/04/22  2.  HTN:  Well controlled.  Continue current medications and  low sodium Dash type diet.   3  CRF:  Continue dialysis EDW lowered after recent hospitalization ? Still about high output CHF from gigantic fistula. Anemia with need for iv  iron and transfused in hospital Will proceed with right heart cath to measure cardiac output and confirm risk of high output CHF I believe nephrology ordering similar flow study on venous portion of fistula via duplex Hopefully we can help her with graft revision if documented and needed  4. Pulmonary HTN:  F/u pulmonary needs sleep study should be improved with lower volume  5. ICH: previous stable on Keppra for seizure prophylaxis    Current medicines are reviewed at length with the patient today.  The patient does not have concerns regarding medicines.  The following changes have been made:  no change  Labs/ tests ordered today include: Pre cath  Orders written labs done   No orders of the defined types were placed in this encounter.    Disposition:   FU with cardiology year      Signed, Jenkins Rouge, MD  07/04/2022 9:18 AM    [facility] [address], [place], [place]  [address] Phone: [phone]; Fax: [phone]

## 2022-06-26 NOTE — Progress Notes (Signed)
Cardiology Office Note   Date:  07/04/2022   ID:  [name], DOB 11/30/1975, MRN [id number]  PCP:  Sonia Side., FNP  Cardiologist:   Jenkins Rouge, MD   No chief complaint on file.     History of Present Illness: Madeline Brown is a 47 y.o. female who presents for post hospital f/u D/c 06/10/22 with volume overload and CHF with hypoxia. Previously seen for pericardial effusion from uremia/minoxidil  that has cleared on subsequent TTE;s She has CRF on dialysis with large LUE fistula   Hospitalized 12/07/19 with HTN emergency ICH and seizures BP meds adjusted and started on Keppra She was non compliant with her meds and presented with pulmonary edema   She is being followed for transplant at [facility] Had stress test in October that was fine  Has 5 kids can help and she has better support system  Most recent hospitalization 12/23-26/23 CHF with volume overload CTA chest no pneumonia or PE Improved with more dialysis D/c weight around 108 lbs had transfusion for anemia and left before any iv iron given Hct as low as 22.3 BNP was only 344   Echo 02/04/22 EF 60-65% severe LVH moderate bi atrial enlargement no significant valve dx   Discussed doing right heart cath to measure CO Discussed with renal I am concerned with high output CHF due to enormous fistula She has a loud continuous flow murmur on exam  Risks of right heart cath including bleeding discussed Willing to proceed Will arrange next week on non dialysis day    Past Medical History:  Diagnosis Date   Anemia    ESRD on HD    Headache, unspecified 09/26/2017   History of blood transfusion 07/2020   Hypertension    Intracranial hemorrhage (HCC)    LVH (left ventricular hypertrophy)    Pericardial effusion    Pulmonary hypertension ([place])    Seizure ([place]) 02/2019   Shortness of breath 07/15/2018   01/16/21 not currently   Stroke ([place])    Mild was told after having a seizure   Thrombocytopenia ([place])     Past  Surgical History:  Procedure Laterality Date   AV FISTULA PLACEMENT Left 06/24/2017   Procedure: CREATION OF LEFT ARM BRACHIALCEPHALIC  ARTERIOVENOUS (AV) FISTULA;  Surgeon: Rosetta Posner, MD;  Location: [place];  Service: Vascular;  Laterality: Left;   CESAREAN SECTION     x2   COMPLEX WOUND CLOSURE Left 07/30/2020   Procedure: COMPLEX WOUND CLOSURE;  Surgeon: Cherre Robins, MD;  Location: [place];  Service: Vascular;  Laterality: Left;   FISTULA SUPERFICIALIZATION Left 10/28/2017   Procedure: SUPERFICIALIZATION LEFT BRACHIOCEPHALIC ARTERIOVENOUS FISTULA;  Surgeon: Rosetta Posner, MD;  Location: [place];  Service: Vascular;  Laterality: Left;   INGUINAL HERNIA REPAIR Bilateral 02/01/2021   Procedure: LAPAROSCOPIC BILATERAL INGUINAL HERNIA REPAIR WITH MESH;  Surgeon: Kinsinger, Arta Bruce, MD;  [facility];  Service: General;  Laterality: Bilateral;   INSERTION OF DIALYSIS CATHETER Right 06/24/2017   Procedure: INSERTION OF TUNNELED DIALYSIS CATHETER;  Surgeon: Rosetta Posner, MD;  [facility];  Service: Vascular;  Laterality: Right;   INSERTION OF DIALYSIS CATHETER Right 07/30/2020   Procedure: INSERTION OF DIALYSIS CATHETER;  Surgeon: Cherre Robins, MD;  [facility];  Service: Vascular;  Laterality: Right;   LIGATION OF COMPETING BRANCHES OF ARTERIOVENOUS FISTULA  10/28/2017   Procedure: LIGATION OF COMPETING BRANCHES OF ARTERIOVENOUS FISTULA x3;  Surgeon: Rosetta Posner, MD;  [facility];  Service: Vascular;;   REVISON OF ARTERIOVENOUS FISTULA Left 07/30/2020   Procedure: LEFT UPPER EXTREMITY ARTERIOVENOUS FISTULA REVISON;  Surgeon: Cherre Robins, MD;  [facility];  Service: Vascular;  Laterality: Left;  PERIPHERAL NERVE BLOCK     Current Outpatient Medications  Medication Sig Dispense Refill   acetaminophen (TYLENOL) 500 MG tablet Take 500-1,000 mg by mouth every 6 (six) hours as needed for mild pain or headache.     amLODipine (NORVASC) 10 MG tablet Take 1 tablet (10 mg  total) by mouth at bedtime. 30 tablet 0   B Complex-C-Folic Acid (DIALYVITE PO) Take 1 tablet by mouth daily with breakfast.     carvedilol (COREG) 25 MG tablet Take 1 tablet (25 mg total) by mouth 2 (two) times daily with a meal. (Patient taking differently: Take 25 mg by mouth See admin instructions. Take 25 mg by mouth at 6:30 AM and 6:00 PM on Sun/Mon/Wed/Fri and 25 mg at 11:00 AM and 8:00 PM on Tues/Thurs/Sat) 60 tablet 0   cinacalcet (SENSIPAR) 30 MG tablet Take 30 mg by mouth daily.     hydrALAZINE (APRESOLINE) 100 MG tablet Take 1 tablet (100 mg total) by mouth 3 (three) times daily. 90 tablet 0   levETIRAcetam (KEPPRA) 1000 MG tablet Take 1 tablet (1,000 mg total) by mouth 2 (two) times daily. (Patient taking differently: Take 1,000 mg by mouth in the morning.) 60 tablet 1   sevelamer carbonate (RENVELA) 800 MG tablet Take 2 tablets (1,600 mg total) by mouth 3 (three) times daily with meals. (Patient taking differently: Take 800-2,400 mg by mouth See admin instructions. Take 1,600-2,400 mg by mouth three times a day with meals and 800 mg with each snack) 30 tablet 0   spironolactone (ALDACTONE) 50 MG tablet Take 1 tablet (50 mg total) by mouth daily. 30 tablet 0   No current facility-administered medications for this visit.    Allergies:   Visine [tetrahydrozoline hcl]    Social History:  The patient  reports that she has been smoking cigarettes. She started smoking about 28 years ago. She has a 13.50 pack-year smoking history. She has never used smokeless tobacco. She reports that she does not currently use alcohol. She reports current drug use. Drug: Marijuana.   Family History:  The patient's family history includes Aneurysm in her mother; Cancer in her father; Diabetes in her father; Heart disease in her father; Hypertension in her father; Seizures in her mother.    ROS:  Please see the history of present illness.   Otherwise, review of systems are positive for none.   All other  systems are reviewed and negative.    PHYSICAL EXAM: VS:  BP 124/60   Pulse 94   Ht 5' (1.524 m)   Wt 109 lb 12.8 oz (49.8 kg)   LMP 12/14/2017   SpO2 (!) 88%   BMI 21.44 kg/m  , BMI Body mass index is 21.44 kg/m. Affect appropriate Chronically ill black female  HEENT: normal Neck supple with no adenopathy JVP normal no bruits no thyromegaly Lungs clear with no wheezing and good diaphragmatic motion Heart:  S1/S2 continuous shunt murmur, no rub, gallop or click PMI normal Abdomen: benighn, BS positve, no tenderness, no AAA no bruit.  No HSM or HJR Distal pulses intact with no bruits No edema Neuro non-focal Skin warm and dry No muscular weakness LUE fistula is gigantic especially for her size loud thrills    EKG:  07/12/18 SR LAE  LVH low voltage    Recent Labs: 02/02/2022: B Natriuretic Peptide 344.6 02/04/2022: Magnesium 2.3 06/07/2022: ALT 10 06/10/2022: BUN 55; Creatinine, Ser 7.06; Hemoglobin 9.4; Platelets 100; Potassium 5.1; Sodium 132    Lipid Panel    Component Value Date/Time   CHOL 172 03/23/2019 0839   TRIG 55 03/23/2019 0839   HDL 93 03/23/2019 0839   CHOLHDL 1.8 03/23/2019 0839   CHOLHDL 2.7 02/21/2019 0425   VLDL 10 02/21/2019 0425   LDLCALC 68 03/23/2019 0839      Wt Readings from Last 3 Encounters:  07/04/22 109 lb 12.8 oz (49.8 kg)  06/10/22 108 lb 7.5 oz (49.2 kg)  02/04/22 110 lb 0.2 oz (49.9 kg)      Other studies Reviewed: Additional studies/ records that were reviewed today include: Notes from hospitalization January 2020 [facility] consult Note labs CTA, CXR and TTE .    ASSESSMENT AND PLAN:  1.  Pericardial effusion: likely related to uremia/minoxidil . Resolved on f/u echo 12/08/19 and 02/04/22  2.  HTN:  Well controlled.  Continue current medications and  low sodium Dash type diet.   3  CRF:  Continue dialysis EDW lowered after recent hospitalization ? Still about high output CHF from gigantic fistula. Anemia with need for iv  iron and transfused in hospital Will proceed with right heart cath to measure cardiac output and confirm risk of high output CHF I believe nephrology ordering similar flow study on venous portion of fistula via duplex Hopefully we can help her with graft revision if documented and needed  4. Pulmonary HTN:  F/u pulmonary needs sleep study should be improved with lower volume  5. ICH: previous stable on Keppra for seizure prophylaxis    Current medicines are reviewed at length with the patient today.  The patient does not have concerns regarding medicines.  The following changes have been made:  no change  Labs/ tests ordered today include: Pre cath  Orders written labs done   No orders of the defined types were placed in this encounter.    Disposition:   FU with cardiology year      Signed, Jenkins Rouge, MD  07/04/2022 9:18 AM    [facility] [place], [place], [place]  [address] Phone: [phone]; Fax: [phone]

## 2022-07-02 ENCOUNTER — Other Ambulatory Visit: Payer: Self-pay | Admitting: *Deleted

## 2022-07-02 DIAGNOSIS — N186 End stage renal disease: Secondary | ICD-10-CM

## 2022-07-04 ENCOUNTER — Encounter: Payer: Self-pay | Admitting: Cardiovascular Disease

## 2022-07-04 ENCOUNTER — Other Ambulatory Visit: Payer: Self-pay | Admitting: Cardiovascular Disease

## 2022-07-04 ENCOUNTER — Ambulatory Visit: Payer: Medicare HMO | Attending: Physician Assistant | Admitting: Cardiovascular Disease

## 2022-07-04 VITALS — BP 124/60 | HR 94 | Ht 60.0 in | Wt 109.8 lb

## 2022-07-04 DIAGNOSIS — I272 Pulmonary hypertension, unspecified: Secondary | ICD-10-CM

## 2022-07-04 DIAGNOSIS — I3139 Other pericardial effusion (noninflammatory): Secondary | ICD-10-CM

## 2022-07-04 DIAGNOSIS — I5033 Acute on chronic diastolic (congestive) heart failure: Secondary | ICD-10-CM

## 2022-07-04 DIAGNOSIS — I10 Essential (primary) hypertension: Secondary | ICD-10-CM

## 2022-07-04 DIAGNOSIS — I5030 Unspecified diastolic (congestive) heart failure: Secondary | ICD-10-CM

## 2022-07-04 LAB — CBC WITH DIFFERENTIAL/PLATELET

## 2022-07-04 MED ORDER — SODIUM CHLORIDE 0.9% FLUSH: 3.0000 mL | Freq: Two times a day (BID) | INTRAVENOUS | Status: DC

## 2022-07-04 NOTE — Patient Instructions (Signed)
Medication Instructions:  Your physician recommends that you continue on your current medications as directed. Please refer to the Current Medication list given to you today.  *If you need a refill on your cardiac medications before your next appointment, please call your pharmacy*  Lab Work: Your physician recommends that you have lab work today- BMET and CBC  If you have labs (blood work) drawn today and your tests are completely normal, you will receive your results only by: MyChart Message (if you have MyChart) OR A paper copy in the mail If you have any lab test that is abnormal or we need to change your treatment, we will call you to review the results.  Testing/Procedures: Your physician has requested that you have a cardiac catheterization. Cardiac catheterization is used to diagnose and/or treat various heart conditions. Doctors may recommend this procedure for a number of different reasons. The most common reason is to evaluate chest pain. Chest pain can be a symptom of coronary artery disease (CAD), and cardiac catheterization can show whether plaque is narrowing or blocking your heart's arteries. This procedure is also used to evaluate the valves, as well as measure the blood flow and oxygen levels in different parts of your heart. For further information please visit [email or web address]. Please follow instruction sheet, as given.  Follow-Up: At [facility], you and your health needs are our priority.  As part of our continuing mission to provide you with exceptional heart care, we have created designated Provider Care Teams.  These Care Teams include your primary Cardiologist (physician) and Advanced Practice Providers (APPs -  Physician Assistants and Nurse Practitioners) who all work together to provide you with the care you need, when you need it.  We recommend signing up for the patient portal called "MyChart".  Sign up information is provided on this After Visit  Summary.  MyChart is used to connect with patients for Virtual Visits (Telemedicine).  Patients are able to view lab/test results, encounter notes, upcoming appointments, etc.  Non-urgent messages can be sent to your provider as well.   To learn more about what you can do with MyChart, go to [email or web address].    Your next appointment:   3 month(s)  Provider:   Jenkins Rouge, MD     Other Instructions  Madeline Brown A DEPT OF [place] A DEPT OF [facility] [place], [place] [id number] [place] [address] Dept: [phone] Loc: [phone]  Madeline Brown  07/04/2022  You are scheduled for a Cardiac Catheterization on Wednesday, January 24 with Dr. Larae Brown.  1. Please arrive at the [facility] (Main Entrance A) at [facility]: [address] [place], [place] [address] at 8:00 AM (This time is two hours before your procedure to ensure your preparation). Free valet parking service is available.   Special note: Every effort is made to have your procedure done on time. Please understand that emergencies sometimes delay scheduled procedures.  2. Diet: Do not eat solid foods after midnight.  The patient may have clear liquids until 5am upon the day of the procedure.  3. Labs: You will need to have blood drawn on today- BMET and CBC  4. Medication instructions in preparation for your procedure:   Contrast Allergy: No  On the morning of your procedure, take your Aspirin 81 mg and any morning medicines NOT listed above.  You may use sips of water.  5.  Plan for one night stay--bring personal belongings. 6. Bring a current list of your medications and current insurance cards. 7. You MUST have a responsible person to drive you home. 8. Someone MUST be with you the first 24 hours after you arrive home or your discharge will be delayed. 9. Please wear clothes that are  easy to get on and off and wear slip-on shoes.  Thank you for allowing [place] to care for you!   -- [facility]

## 2022-07-05 LAB — BASIC METABOLIC PANEL
BUN/Creatinine Ratio: 6 — ABNORMAL LOW (ref 9–23)
BUN: 35 mg/dL — ABNORMAL HIGH (ref 6–24)
CO2: 29 mmol/L (ref 20–29)
Calcium: 9.6 mg/dL (ref 8.7–10.2)
Chloride: 95 mmol/L — ABNORMAL LOW (ref 96–106)
Creatinine, Ser: 6.21 mg/dL — ABNORMAL HIGH (ref 0.57–1.00)
Glucose: 108 mg/dL — ABNORMAL HIGH (ref 70–99)
Potassium: 3.8 mmol/L (ref 3.5–5.2)
Sodium: 140 mmol/L (ref 134–144)
eGFR: 8 mL/min/{1.73_m2} — ABNORMAL LOW (ref >59)

## 2022-07-05 LAB — CBC WITH DIFFERENTIAL/PLATELET
Basophils Absolute: 0 10*3/uL (ref 0.0–0.2)
Basos: 1 %
EOS (ABSOLUTE): 0.1 10*3/uL (ref 0.0–0.4)
Eos: 4 %
Hematocrit: 27.8 % — ABNORMAL LOW (ref 34.0–46.6)
Hemoglobin: 9.2 g/dL — ABNORMAL LOW (ref 11.1–15.9)
Immature Grans (Abs): 0 10*3/uL (ref 0.0–0.1)
Immature Granulocytes: 0 %
Lymphocytes Absolute: 0.8 10*3/uL (ref 0.7–3.1)
Lymphs: 29 %
MCH: 31.3 pg (ref 26.6–33.0)
MCHC: 33.1 g/dL (ref 31.5–35.7)
MCV: 95 fL (ref 79–97)
Monocytes Absolute: 0.1 10*3/uL (ref 0.1–0.9)
Monocytes: 4 %
Neutrophils Absolute: 1.7 10*3/uL (ref 1.4–7.0)
Neutrophils: 62 %
Platelets: 110 10*3/uL — ABNORMAL LOW (ref 150–450)
RBC: 2.94 x10E6/uL — ABNORMAL LOW (ref 3.77–5.28)
RDW: 14.5 % (ref 11.7–15.4)
WBC: 2.8 10*3/uL — ABNORMAL LOW (ref 3.4–10.8)

## 2022-07-08 ENCOUNTER — Telehealth: Payer: Self-pay | Admitting: *Deleted

## 2022-07-08 NOTE — Progress Notes (Deleted)
[facility] [place] HISTORY AND PHYSICAL   History of Present Illness:  Patient is a 47 y.o. year old female who presents for placement of a permanent hemodialysis access. The patient is {handedness:315516} .  The patient {Is/is not:9024} currently on hemodialysis.  The cause of renal failure is thought to be secondary to ***.  Other chronic medical problems include ***.  Past Medical History:  Diagnosis Date   Anemia    ESRD on HD    Headache, unspecified 09/26/2017   History of blood transfusion 07/2020   Hypertension    Intracranial hemorrhage (HCC)    LVH (left ventricular hypertrophy)    Pericardial effusion    Pulmonary hypertension (HCC)    Seizure ([place]) 02/2019   Shortness of breath 07/15/2018   01/16/21 not currently   Stroke [facility])    Mild was told after having a seizure   Thrombocytopenia ([place])     Past Surgical History:  Procedure Laterality Date   AV FISTULA PLACEMENT Left 06/24/2017   Procedure: CREATION OF LEFT ARM BRACHIALCEPHALIC  ARTERIOVENOUS (AV) FISTULA;  Surgeon: Rosetta Posner, MD;  Location: [place];  Service: Vascular;  Laterality: Left;   CESAREAN SECTION     x2   COMPLEX WOUND CLOSURE Left 07/30/2020   Procedure: COMPLEX WOUND CLOSURE;  Surgeon: Cherre Robins, MD;  Location: [place];  Service: Vascular;  Laterality: Left;   FISTULA SUPERFICIALIZATION Left 10/28/2017   Procedure: SUPERFICIALIZATION LEFT BRACHIOCEPHALIC ARTERIOVENOUS FISTULA;  Surgeon: Rosetta Posner, MD;  Location: [place];  Service: Vascular;  Laterality: Left;   INGUINAL HERNIA REPAIR Bilateral 02/01/2021   Procedure: LAPAROSCOPIC BILATERAL INGUINAL HERNIA REPAIR WITH MESH;  Surgeon: Kinsinger, Arta Bruce, MD;  [facility];  Service: General;  Laterality: Bilateral;   INSERTION OF DIALYSIS CATHETER Right 06/24/2017   Procedure: INSERTION OF TUNNELED DIALYSIS CATHETER;  Surgeon: Rosetta Posner, MD;  [facility];  Service: Vascular;  Laterality: Right;   INSERTION OF  DIALYSIS CATHETER Right 07/30/2020   Procedure: INSERTION OF DIALYSIS CATHETER;  Surgeon: Cherre Robins, MD;  Location: [place];  Service: Vascular;  Laterality: Right;   LIGATION OF COMPETING BRANCHES OF ARTERIOVENOUS FISTULA  10/28/2017   Procedure: LIGATION OF COMPETING BRANCHES OF ARTERIOVENOUS FISTULA x3;  Surgeon: Rosetta Posner, MD;  [facility];  Service: Vascular;;   REVISON OF ARTERIOVENOUS FISTULA Left 07/30/2020   Procedure: LEFT UPPER EXTREMITY ARTERIOVENOUS FISTULA REVISON;  Surgeon: Cherre Robins, MD;  [facility];  Service: Vascular;  Laterality: Left;  PERIPHERAL NERVE BLOCK     Social History Social History   Tobacco Use   Smoking status: Every Day    Packs/day: 0.50    Years: 27.00    Total pack years: 13.50    Types: Cigarettes    Start date: 1996   Smokeless tobacco: Never  Vaping Use   Vaping Use: Never used  Substance Use Topics   Alcohol use: Not Currently    Comment: occassional beer   Drug use: Yes    Types: Marijuana    Family History Family History  Problem Relation Age of Onset   Diabetes Father    Hypertension Father    Cancer Father    Heart disease Father    Aneurysm Mother    Seizures Mother     Allergies  Allergies  Allergen Reactions   Visine [Tetrahydrozoline Hcl] Swelling and Other (See Comments)    Eyes Swell     Current Outpatient  Medications  Medication Sig Dispense Refill   acetaminophen (TYLENOL) 500 MG tablet Take 500-1,000 mg by mouth every 6 (six) hours as needed for mild pain or headache.     amLODipine (NORVASC) 10 MG tablet Take 1 tablet (10 mg total) by mouth at bedtime. 30 tablet 0   B Complex-C-Folic Acid (DIALYVITE PO) Take 1 tablet by mouth daily with breakfast.     carvedilol (COREG) 25 MG tablet Take 1 tablet (25 mg total) by mouth 2 (two) times daily with a meal. (Patient taking differently: Take 25 mg by mouth 2 (two) times daily with a meal. Take 25 mg by mouth at 6:30 AM and 6:00 PM on  Sun/Mon/Wed/Fri and 25 mg at 11:00 AM and 8:00 PM on Tues/Thurs/Sat) 60 tablet 0   cinacalcet (SENSIPAR) 30 MG tablet Take 30 mg by mouth daily with breakfast.     diphenhydrAMINE HCl (BENADRYL PO) Take 2 tablets by mouth Every Tuesday,Thursday,and Saturday with dialysis. As needed     hydrALAZINE (APRESOLINE) 100 MG tablet Take 1 tablet (100 mg total) by mouth 3 (three) times daily. 90 tablet 0   levETIRAcetam (KEPPRA) 1000 MG tablet Take 1 tablet (1,000 mg total) by mouth 2 (two) times daily. (Patient taking differently: Take 1,000 mg by mouth daily.) 60 tablet 1   sevelamer carbonate (RENVELA) 800 MG tablet Take 2 tablets (1,600 mg total) by mouth 3 (three) times daily with meals. (Patient taking differently: Take 800-2,400 mg by mouth See admin instructions. Take 1,600-2,400 mg by mouth three times a day with meals and 800 mg with each snack) 30 tablet 0   spironolactone (ALDACTONE) 50 MG tablet Take 1 tablet (50 mg total) by mouth daily. 30 tablet 0   Current Facility-Administered Medications  Medication Dose Route Frequency Provider Last Rate Last Admin   sodium chloride flush (NS) 0.9 % injection 3 mL  3 mL Intravenous Q12H Josue Hector, MD        ROS:   General:  No weight loss, Fever, chills  HEENT: No recent headaches, no nasal bleeding, no visual changes, no sore throat  Neurologic: No dizziness, blackouts, seizures. No recent symptoms of stroke or mini- stroke. No recent episodes of slurred speech, or temporary blindness.  Cardiac: No recent episodes of chest pain/pressure, no shortness of breath at rest.  No shortness of breath with exertion.  Denies history of atrial fibrillation or irregular heartbeat  Vascular: No history of rest pain in feet.  No history of claudication.  No history of non-healing ulcer, No history of DVT   Pulmonary: No home oxygen, no productive cough, no hemoptysis,  No asthma or wheezing  Musculoskeletal:  '[ ]'$  Arthritis, '[ ]'$  Low back pain,  '[ ]'$   Joint pain  Hematologic:No history of hypercoagulable state.  No history of easy bleeding.  No history of anemia  Gastrointestinal: No hematochezia or melena,  No gastroesophageal reflux, no trouble swallowing  Urinary: '[ ]'$  chronic Kidney disease, '[ ]'$  on HD - '[ ]'$  MWF or '[ ]'$  TTHS, '[ ]'$  Burning with urination, '[ ]'$  Frequent urination, '[ ]'$  Difficulty urinating;   Skin: No rashes  Psychological: No history of anxiety,  No history of depression   Physical Examination  There were no vitals filed for this visit.  There is no height or weight on file to calculate BMI.  General:  Alert and oriented, no acute distress HEENT: Normal Neck: No bruit or JVD Pulmonary: Clear to auscultation bilaterally Cardiac: Regular Rate and Rhythm  without murmur Gastrointestinal: Soft, non-tender, non-distended, no mass, no scars Skin: No rash Extremity Pulses:  2+ radial, brachial pulses bilaterally Musculoskeletal: No deformity or edema  Neurologic: Upper and lower extremity motor 5/5 and symmetric  DATA:    ASSESSMENT:    PLAN:  Ruta Hinds, MD [facility] [place] Office: [phone] Pager: [phone]

## 2022-07-08 NOTE — Telephone Encounter (Signed)
Right Heart Cath scheduled at [facility] for: Wednesday July 09, 2022 10 AM Arrival time and place: [facility] Main Entrance A at: 8 AM  Nothing to eat after midnight prior to procedure, clear liquids until 5 AM day of procedure.  Medication instructions: -Hold:  Spironolactone-AM of procedure -Except hold medications usual morning medications can be taken with sips of water.  Confirmed patient has responsible adult to drive home post procedure and be with patient first 24 hours after arriving home.  Patient reports no new symptoms concerning for COVID-19 in the past 10 days.  Reviewed procedure instructions with patient.   Confirmed with patient dialysis schedule is Tu-Th-Sat.

## 2022-07-09 ENCOUNTER — Ambulatory Visit: Payer: Medicare HMO

## 2022-07-09 ENCOUNTER — Ambulatory Visit (HOSPITAL_COMMUNITY)
Admission: RE | Admit: 2022-07-09 | Discharge: 2022-07-09 | Disposition: A | Discharge status: Home / Self Care | Payer: Medicare HMO | Source: Ambulatory Visit | Attending: Interventional Cardiology | Admitting: Interventional Cardiology

## 2022-07-09 ENCOUNTER — Encounter (HOSPITAL_COMMUNITY)
Admission: RE | Disposition: A | Discharge status: Home / Self Care | Payer: Self-pay | Source: Ambulatory Visit | Attending: Interventional Cardiology

## 2022-07-09 ENCOUNTER — Ambulatory Visit (HOSPITAL_COMMUNITY): Payer: Medicare HMO

## 2022-07-09 DIAGNOSIS — N186 End stage renal disease: Secondary | ICD-10-CM | POA: Diagnosis not present

## 2022-07-09 DIAGNOSIS — I509 Heart failure, unspecified: Secondary | ICD-10-CM | POA: Diagnosis not present

## 2022-07-09 DIAGNOSIS — I272 Pulmonary hypertension, unspecified: Secondary | ICD-10-CM | POA: Diagnosis not present

## 2022-07-09 DIAGNOSIS — D631 Anemia in chronic kidney disease: Secondary | ICD-10-CM | POA: Insufficient documentation

## 2022-07-09 DIAGNOSIS — Z992 Dependence on renal dialysis: Secondary | ICD-10-CM | POA: Diagnosis not present

## 2022-07-09 DIAGNOSIS — I132 Hypertensive heart and chronic kidney disease with heart failure and with stage 5 chronic kidney disease, or end stage renal disease: Secondary | ICD-10-CM | POA: Insufficient documentation

## 2022-07-09 DIAGNOSIS — F17210 Nicotine dependence, cigarettes, uncomplicated: Secondary | ICD-10-CM | POA: Diagnosis not present

## 2022-07-09 DIAGNOSIS — I5030 Unspecified diastolic (congestive) heart failure: Secondary | ICD-10-CM

## 2022-07-09 HISTORY — PX: RIGHT HEART CATH: CATH118263

## 2022-07-09 LAB — POCT I-STAT EG7
Acid-Base Excess: 6 mmol/L — ABNORMAL HIGH (ref 0.0–2.0)
Acid-Base Excess: 7 mmol/L — ABNORMAL HIGH (ref 0.0–2.0)
Acid-Base Excess: 7 mmol/L — ABNORMAL HIGH (ref 0.0–2.0)
Acid-Base Excess: 8 mmol/L — ABNORMAL HIGH (ref 0.0–2.0)
Bicarbonate: 30.6 mmol/L — ABNORMAL HIGH (ref 20.0–28.0)
Bicarbonate: 30.9 mmol/L — ABNORMAL HIGH (ref 20.0–28.0)
Bicarbonate: 31.6 mmol/L — ABNORMAL HIGH (ref 20.0–28.0)
Bicarbonate: 32.8 mmol/L — ABNORMAL HIGH (ref 20.0–28.0)
Calcium, Ion: 1.1 mmol/L — ABNORMAL LOW (ref 1.15–1.40)
Calcium, Ion: 1.11 mmol/L — ABNORMAL LOW (ref 1.15–1.40)
Calcium, Ion: 1.12 mmol/L — ABNORMAL LOW (ref 1.15–1.40)
Calcium, Ion: 1.13 mmol/L — ABNORMAL LOW (ref 1.15–1.40)
HCT: 26 % — ABNORMAL LOW (ref 36.0–46.0)
HCT: 26 % — ABNORMAL LOW (ref 36.0–46.0)
HCT: 27 % — ABNORMAL LOW (ref 36.0–46.0)
HCT: 27 % — ABNORMAL LOW (ref 36.0–46.0)
Hemoglobin: 8.8 g/dL — ABNORMAL LOW (ref 12.0–15.0)
Hemoglobin: 8.8 g/dL — ABNORMAL LOW (ref 12.0–15.0)
Hemoglobin: 9.2 g/dL — ABNORMAL LOW (ref 12.0–15.0)
Hemoglobin: 9.2 g/dL — ABNORMAL LOW (ref 12.0–15.0)
O2 Saturation: 73 %
O2 Saturation: 76 %
O2 Saturation: 80 %
O2 Saturation: 81 %
Potassium: 3.3 mmol/L — ABNORMAL LOW (ref 3.5–5.1)
Potassium: 3.3 mmol/L — ABNORMAL LOW (ref 3.5–5.1)
Potassium: 3.4 mmol/L — ABNORMAL LOW (ref 3.5–5.1)
Potassium: 3.4 mmol/L — ABNORMAL LOW (ref 3.5–5.1)
Sodium: 138 mmol/L (ref 135–145)
Sodium: 138 mmol/L (ref 135–145)
Sodium: 139 mmol/L (ref 135–145)
Sodium: 139 mmol/L (ref 135–145)
TCO2: 32 mmol/L (ref 22–32)
TCO2: 32 mmol/L (ref 22–32)
TCO2: 33 mmol/L — ABNORMAL HIGH (ref 22–32)
TCO2: 34 mmol/L — ABNORMAL HIGH (ref 22–32)
pCO2, Ven: 41.9 mmHg — ABNORMAL LOW (ref 44–60)
pCO2, Ven: 42.4 mmHg — ABNORMAL LOW (ref 44–60)
pCO2, Ven: 43.3 mmHg — ABNORMAL LOW (ref 44–60)
pCO2, Ven: 43.7 mmHg — ABNORMAL LOW (ref 44–60)
pH, Ven: 7.471 — ABNORMAL HIGH (ref 7.25–7.43)
pH, Ven: 7.471 — ABNORMAL HIGH (ref 7.25–7.43)
pH, Ven: 7.472 — ABNORMAL HIGH (ref 7.25–7.43)
pH, Ven: 7.483 — ABNORMAL HIGH (ref 7.25–7.43)
pO2, Ven: 36 mmHg (ref 32–45)
pO2, Ven: 38 mmHg (ref 32–45)
pO2, Ven: 41 mmHg (ref 32–45)
pO2, Ven: 43 mmHg (ref 32–45)

## 2022-07-09 SURGERY — RIGHT HEART CATH

## 2022-07-09 MED ORDER — HEPARIN SODIUM (PORCINE) 1000 UNIT/ML IJ SOLN
INTRAMUSCULAR | Status: AC
  Filled 2022-07-09: qty 10

## 2022-07-09 MED ORDER — MIDAZOLAM HCL 2 MG/2ML IJ SOLN
INTRAMUSCULAR | Status: AC
  Filled 2022-07-09: qty 2

## 2022-07-09 MED ORDER — MIDAZOLAM HCL 2 MG/2ML IJ SOLN
INTRAMUSCULAR | Status: DC | PRN
  Administered 2022-07-09: 1 mg via INTRAVENOUS

## 2022-07-09 MED ORDER — LIDOCAINE HCL (PF) 1 % IJ SOLN
INTRAMUSCULAR | Status: AC
  Filled 2022-07-09: qty 30

## 2022-07-09 MED ORDER — HEPARIN (PORCINE) IN NACL 1000-0.9 UT/500ML-% IV SOLN
INTRAVENOUS | Status: DC | PRN
  Administered 2022-07-09: 500 mL

## 2022-07-09 MED ORDER — ASPIRIN 81 MG PO CHEW: 81.0000 mg | CHEWABLE_TABLET | ORAL | Status: DC

## 2022-07-09 MED ORDER — SODIUM CHLORIDE 0.9 % IV SOLN: INTRAVENOUS | Status: DC

## 2022-07-09 MED ORDER — FENTANYL CITRATE (PF) 100 MCG/2ML IJ SOLN
INTRAMUSCULAR | Status: AC
  Filled 2022-07-09: qty 2

## 2022-07-09 MED ORDER — HEPARIN SODIUM (PORCINE) 1000 UNIT/ML IJ SOLN
INTRAMUSCULAR | Status: DC | PRN
  Administered 2022-07-09: 3000 [IU] via INTRAVENOUS

## 2022-07-09 MED ORDER — HEPARIN (PORCINE) IN NACL 1000-0.9 UT/500ML-% IV SOLN
INTRAVENOUS | Status: AC
  Filled 2022-07-09: qty 1000

## 2022-07-09 MED ORDER — SODIUM CHLORIDE 0.9 % IV SOLN: 250.0000 mL | INTRAVENOUS | Status: DC | PRN

## 2022-07-09 MED ORDER — LIDOCAINE HCL (PF) 1 % IJ SOLN
INTRAMUSCULAR | Status: DC | PRN
  Administered 2022-07-09: 2 mL

## 2022-07-09 MED ORDER — SODIUM CHLORIDE 0.9% FLUSH: 3.0000 mL | INTRAVENOUS | Status: DC | PRN

## 2022-07-09 MED ORDER — FENTANYL CITRATE (PF) 100 MCG/2ML IJ SOLN
INTRAMUSCULAR | Status: DC | PRN
  Administered 2022-07-09: 25 ug via INTRAVENOUS

## 2022-07-09 SURGICAL SUPPLY — 4 items
CATH SWAN GANZ 7F STRAIGHT (CATHETERS) IMPLANT
GLIDESHEATH SLENDER 7FR .021G (SHEATH) IMPLANT
PACK CARDIAC CATHETERIZATION (CUSTOM PROCEDURE TRAY) IMPLANT
WIRE EMERALD 3MM-J .025X260CM (WIRE) IMPLANT

## 2022-07-09 NOTE — Interval H&P Note (Signed)
History and Physical Interval Note:  07/09/2022 9:42 AM  Madeline Brown  has presented today for surgery, with the diagnosis of hf.  The various methods of treatment have been discussed with the patient and family. After consideration of risks, benefits and other options for treatment, the patient has consented to  Procedure(s): RIGHT HEART CATH (N/A) as a surgical intervention.  The patient's history has been reviewed, patient examined, no change in status, stable for surgery.  I have reviewed the patient's chart and labs.  Questions were answered to the patient's satisfaction.     Yamilette Garretson

## 2022-07-10 ENCOUNTER — Encounter (HOSPITAL_COMMUNITY): Payer: Self-pay | Admitting: Cardiology

## 2022-07-10 LAB — POCT I-STAT EG7
Acid-Base Excess: 7 mmol/L — ABNORMAL HIGH (ref 0.0–2.0)
Acid-Base Excess: 7 mmol/L — ABNORMAL HIGH (ref 0.0–2.0)
Acid-Base Excess: 8 mmol/L — ABNORMAL HIGH (ref 0.0–2.0)
Bicarbonate: 31.7 mmol/L — ABNORMAL HIGH (ref 20.0–28.0)
Bicarbonate: 32 mmol/L — ABNORMAL HIGH (ref 20.0–28.0)
Bicarbonate: 32.3 mmol/L — ABNORMAL HIGH (ref 20.0–28.0)
Calcium, Ion: 1.07 mmol/L — ABNORMAL LOW (ref 1.15–1.40)
Calcium, Ion: 1.12 mmol/L — ABNORMAL LOW (ref 1.15–1.40)
Calcium, Ion: 1.15 mmol/L (ref 1.15–1.40)
HCT: 26 % — ABNORMAL LOW (ref 36.0–46.0)
HCT: 27 % — ABNORMAL LOW (ref 36.0–46.0)
HCT: 34 % — ABNORMAL LOW (ref 36.0–46.0)
Hemoglobin: 11.6 g/dL — ABNORMAL LOW (ref 12.0–15.0)
Hemoglobin: 8.8 g/dL — ABNORMAL LOW (ref 12.0–15.0)
Hemoglobin: 9.2 g/dL — ABNORMAL LOW (ref 12.0–15.0)
O2 Saturation: 50 %
O2 Saturation: 73 %
O2 Saturation: 75 %
Potassium: 3.3 mmol/L — ABNORMAL LOW (ref 3.5–5.1)
Potassium: 3.3 mmol/L — ABNORMAL LOW (ref 3.5–5.1)
Potassium: 3.4 mmol/L — ABNORMAL LOW (ref 3.5–5.1)
Sodium: 139 mmol/L (ref 135–145)
Sodium: 139 mmol/L (ref 135–145)
Sodium: 140 mmol/L (ref 135–145)
TCO2: 33 mmol/L — ABNORMAL HIGH (ref 22–32)
TCO2: 33 mmol/L — ABNORMAL HIGH (ref 22–32)
TCO2: 34 mmol/L — ABNORMAL HIGH (ref 22–32)
pCO2, Ven: 44.3 mmHg (ref 44–60)
pCO2, Ven: 44.6 mmHg (ref 44–60)
pCO2, Ven: 44.9 mmHg (ref 44–60)
pH, Ven: 7.463 — ABNORMAL HIGH (ref 7.25–7.43)
pH, Ven: 7.464 — ABNORMAL HIGH (ref 7.25–7.43)
pH, Ven: 7.465 — ABNORMAL HIGH (ref 7.25–7.43)
pO2, Ven: 26 mmHg — CL (ref 32–45)
pO2, Ven: 37 mmHg (ref 32–45)
pO2, Ven: 38 mmHg (ref 32–45)

## 2022-07-16 ENCOUNTER — Ambulatory Visit (HOSPITAL_COMMUNITY): Payer: Medicare HMO | Attending: Physician Assistant

## 2022-07-16 ENCOUNTER — Ambulatory Visit: Payer: Medicare HMO

## 2022-07-25 ENCOUNTER — Ambulatory Visit: Payer: Medicare HMO

## 2022-07-25 ENCOUNTER — Ambulatory Visit (HOSPITAL_COMMUNITY): Payer: Medicare HMO

## 2022-08-01 ENCOUNTER — Other Ambulatory Visit: Payer: Self-pay

## 2022-08-01 ENCOUNTER — Ambulatory Visit (HOSPITAL_COMMUNITY)
Admission: RE | Admit: 2022-08-01 | Discharge: 2022-08-01 | Disposition: A | Discharge status: Home / Self Care | Payer: Medicare HMO | Source: Ambulatory Visit | Attending: Physician Assistant | Admitting: Physician Assistant

## 2022-08-01 ENCOUNTER — Ambulatory Visit (INDEPENDENT_AMBULATORY_CARE_PROVIDER_SITE_OTHER): Payer: Medicare HMO | Admitting: Physician Assistant

## 2022-08-01 ENCOUNTER — Encounter: Payer: Self-pay | Admitting: Vascular Surgery

## 2022-08-01 VITALS — BP 121/82 | HR 100 | Temp 98.0°F | Resp 20 | Ht 60.0 in | Wt 107.8 lb

## 2022-08-01 DIAGNOSIS — N186 End stage renal disease: Secondary | ICD-10-CM | POA: Diagnosis present

## 2022-08-01 DIAGNOSIS — T82898A Other specified complication of vascular prosthetic devices, implants and grafts, initial encounter: Secondary | ICD-10-CM

## 2022-08-01 DIAGNOSIS — Z992 Dependence on renal dialysis: Secondary | ICD-10-CM

## 2022-08-01 NOTE — Progress Notes (Signed)
Established Dialysis Access   History of Present Illness   Madeline Brown is a 47 y.o. ([birth year]) female who presents for reevaluation of left brachiocephalic fistula.  This fistula was first created in January 2019 and superficial lysed in May 2019 by Dr. Donnetta Hutching.  She required extensive excision and plication of the fistula and placement of right IJ [facility] in February 2022 by Dr. Stanford Breed.  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  She completely healed from this procedure and her right IJ [facility] was removed in June 2022.   She has now been referred back to our office for similar issues with the fistula.  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   She has been dialyzing fine through her fistula on Tuesdays, Thursdays, and Saturdays.  Since her fistula has gotten bigger and CHF has worsened, she has been dealing with more shortness of breath with activity.  She denies any steal symptoms in the left hand.  Current Outpatient Medications  Medication Sig Dispense Refill   acetaminophen (TYLENOL) 500 MG tablet Take 500-1,000 mg by mouth every 6 (six) hours as needed for mild pain or headache.     amLODipine (NORVASC) 10 MG tablet Take 1 tablet (10 mg total) by mouth at bedtime. 30 tablet 0   B Complex-C-Folic Acid (DIALYVITE PO) Take 1 tablet by mouth daily with breakfast.     carvedilol (COREG) 25 MG tablet Take 1 tablet (25 mg total) by mouth 2 (two) times daily with a meal. (Patient taking differently: Take 25 mg by mouth 2 (two) times daily with a meal. Take 25 mg by mouth at 6:30 AM and 6:00 PM on Sun/Mon/Wed/Fri and 25 mg at 11:00 AM and 8:00 PM on Tues/Thurs/Sat) 60 tablet 0   cinacalcet (SENSIPAR) 30 MG tablet Take 30 mg by mouth daily with breakfast.     diphenhydrAMINE HCl (BENADRYL PO) Take 2 tablets by mouth Every  Tuesday,Thursday,and Saturday with dialysis. As needed     hydrALAZINE (APRESOLINE) 100 MG tablet Take 1 tablet (100 mg total) by mouth 3 (three) times daily. 90 tablet 0   levETIRAcetam (KEPPRA) 1000 MG tablet Take 1 tablet (1,000 mg total) by mouth 2 (two) times daily. (Patient taking differently: Take 1,000 mg by mouth daily.) 60 tablet 1   sevelamer carbonate (RENVELA) 800 MG tablet Take 2 tablets (1,600 mg total) by mouth 3 (three) times daily with meals. (Patient taking differently: Take 800-2,400 mg by mouth See admin instructions. Take 1,600-2,400 mg by mouth three times a day with meals and 800 mg with each snack) 30 tablet 0   spironolactone (ALDACTONE) 50 MG tablet Take 1 tablet (50 mg total) by mouth daily. 30 tablet 0   Current Facility-Administered Medications  Medication Dose Route Frequency Provider Last Rate Last Admin   sodium chloride flush (NS) 0.9 % injection 3 mL  3 mL Intravenous Q12H Josue Hector, MD        REVIEW OF SYSTEMS (negative unless checked):   Cardiac:  []$  Chest pain or chest pressure? [x]$  Shortness of breath upon activity? []$  Shortness of breath when lying flat? []$  Irregular heart rhythm?  Vascular:  []$  Pain in calf, thigh, or hip brought on by walking? []$  Pain in feet at night that wakes you up from your sleep? []$  Blood clot in your veins? []$  Leg  swelling?  Pulmonary:  []$  Oxygen at home? []$  Productive cough? []$  Wheezing?  Neurologic:  []$  Sudden weakness in arms or legs? []$  Sudden numbness in arms or legs? []$  Sudden onset of difficult speaking or slurred speech? []$  Temporary loss of vision in one eye? []$  Problems with dizziness?  Gastrointestinal:  []$  Blood in stool? []$  Vomited blood?  Genitourinary:  []$  Burning when urinating? []$  Blood in urine?  Psychiatric:  []$  Major depression  Hematologic:  []$  Bleeding problems? []$  Problems with blood clotting?  Dermatologic:  []$  Rashes or ulcers?  Constitutional:  []$  Fever or  chills?  Ear/Nose/Throat:  []$  Change in hearing? []$  Nose bleeds? []$  Sore throat?  Musculoskeletal:  []$  Back pain? []$  Joint pain? []$  Muscle pain?   Physical Examination   Vitals:   08/01/22 0851  BP: 121/82  Pulse: 100  Resp: 20  Temp: 98 F (36.7 C)  TempSrc: Temporal  SpO2: 96%  Weight: 107 lb 12.8 oz (48.9 kg)  Height: 5' (1.524 m)   Body mass index is 21.05 kg/m.  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  Fistula has a palpable thrill with some pulsatility Musculoskeletal: no muscle wasting or atrophy  Neurologic: A&O X 3;  No focal weakness or paresthesias are detected Psychiatric:  The pt has Normal affect.   Non-invasive Vascular Imaging   Left Arm Access Duplex  (08/01/2022):  +--------------------+----------+-----------------+--------+  AVF                PSV (cm/s)Flow Vol (mL/min)Comments  +--------------------+----------+-----------------+--------+  Native artery inflow   184          5304                 +--------------------+----------+-----------------+--------+  AVF Anastomosis        198                               +--------------------+----------+-----------------+--------+     +------------+----------+-------------+----------+----------+  OUTFLOW VEINPSV (cm/s)Diameter (cm)Depth (cm) Describe   +------------+----------+-------------+----------+----------+  Prox UA         64        2.48        0.26   aneurysmal  +------------+----------+-------------+----------+----------+  Mid UA          49        2.52        0.28   aneurysmal  +------------+----------+-------------+----------+----------+   Dist UA         51        2.47        0.29   aneurysmal  +------------+----------+-------------+----------+----------+  AC Fossa       158        2.99        0.23   aneurysmal  +------------+----------+-------------+----------+----------+        Summary:  Arteriovenous fistula-Aneurysmal dilatation noted.  Arteriovenous graft-Velocities of less than 100cm/s noted.  Volume flow of 5304 mL/min noted in the inflow artery.     Medical Decision Making   Madeline Brown is a 47 y.o. 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  This  fistula required extensive revision with plication in 123456 with Dr. Stanford Breed because it was severely aneurysmal with high output of 4 L/min, causing strain on the heart.  She healed this revision in June 2022 and resumed dialysis through it without issue.  She returns again with the same issues.  Her fistula is severely aneurysmal throughout with flow of 5 L/min.  She has been dealing with likely high output CHF and volume overload, due to the high flow rate of this fistula She has not been having any steal symptoms.  She has a palpable left radial pulse I explained to the patient she will need revision with plication and placement of TDC catheter in the next few weeks with Dr. Stanford Breed.  There is some likelihood that this fistula may fail even with revision.  Hopefully after revision this will improve her cardiac status   Vicente Serene PA-C [facility] [place] Office: [facility] MD: Virl Cagey

## 2022-08-01 NOTE — H&P (View-Only) (Signed)
Established Dialysis Access   History of Present Illness   Madeline Brown is a 47 y.o. (11/02/1975) female who presents for reevaluation of left brachiocephalic fistula.  This fistula was first created in January 2019 and superficial lysed in May 2019 by Dr. Donnetta Hutching.  She required extensive excision and plication of the fistula and placement of right IJ [facility] in February 2022 by Dr. Stanford Breed.  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  She completely healed from this procedure and her right IJ [facility] was removed in June 2022.   She has now been referred back to our office for similar issues with the fistula.  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   She has been dialyzing fine through her fistula on Tuesdays, Thursdays, and Saturdays.  Since her fistula has gotten bigger and CHF has worsened, she has been dealing with more shortness of breath with activity.  She denies any steal symptoms in the left hand.  Current Outpatient Medications  Medication Sig Dispense Refill   acetaminophen (TYLENOL) 500 MG tablet Take 500-1,000 mg by mouth every 6 (six) hours as needed for mild pain or headache.     amLODipine (NORVASC) 10 MG tablet Take 1 tablet (10 mg total) by mouth at bedtime. 30 tablet 0   B Complex-C-Folic Acid (DIALYVITE PO) Take 1 tablet by mouth daily with breakfast.     carvedilol (COREG) 25 MG tablet Take 1 tablet (25 mg total) by mouth 2 (two) times daily with a meal. (Patient taking differently: Take 25 mg by mouth 2 (two) times daily with a meal. Take 25 mg by mouth at 6:30 AM and 6:00 PM on Sun/Mon/Wed/Fri and 25 mg at 11:00 AM and 8:00 PM on Tues/Thurs/Sat) 60 tablet 0   cinacalcet (SENSIPAR) 30 MG tablet Take 30 mg by mouth daily with breakfast.     diphenhydrAMINE HCl (BENADRYL PO) Take 2 tablets by mouth Every  Tuesday,Thursday,and Saturday with dialysis. As needed     hydrALAZINE (APRESOLINE) 100 MG tablet Take 1 tablet (100 mg total) by mouth 3 (three) times daily. 90 tablet 0   levETIRAcetam (KEPPRA) 1000 MG tablet Take 1 tablet (1,000 mg total) by mouth 2 (two) times daily. (Patient taking differently: Take 1,000 mg by mouth daily.) 60 tablet 1   sevelamer carbonate (RENVELA) 800 MG tablet Take 2 tablets (1,600 mg total) by mouth 3 (three) times daily with meals. (Patient taking differently: Take 800-2,400 mg by mouth See admin instructions. Take 1,600-2,400 mg by mouth three times a day with meals and 800 mg with each snack) 30 tablet 0   spironolactone (ALDACTONE) 50 MG tablet Take 1 tablet (50 mg total) by mouth daily. 30 tablet 0   Current Facility-Administered Medications  Medication Dose Route Frequency Provider Last Rate Last Admin   sodium chloride flush (NS) 0.9 % injection 3 mL  3 mL Intravenous Q12H Josue Hector, MD        REVIEW OF SYSTEMS (negative unless checked):   Cardiac:  []$  Chest pain or chest pressure? [x]$  Shortness of breath upon activity? []$  Shortness of breath when lying flat? []$  Irregular heart rhythm?  Vascular:  []$  Pain in calf, thigh, or hip brought on by walking? []$  Pain in feet at night that wakes you up from your sleep? []$  Blood clot in your veins? []$  Leg  swelling?  Pulmonary:  []$  Oxygen at home? []$  Productive cough? []$  Wheezing?  Neurologic:  []$  Sudden weakness in arms or legs? []$  Sudden numbness in arms or legs? []$  Sudden onset of difficult speaking or slurred speech? []$  Temporary loss of vision in one eye? []$  Problems with dizziness?  Gastrointestinal:  []$  Blood in stool? []$  Vomited blood?  Genitourinary:  []$  Burning when urinating? []$  Blood in urine?  Psychiatric:  []$  Major depression  Hematologic:  []$  Bleeding problems? []$  Problems with blood clotting?  Dermatologic:  []$  Rashes or ulcers?  Constitutional:  []$  Fever or  chills?  Ear/Nose/Throat:  []$  Change in hearing? []$  Nose bleeds? []$  Sore throat?  Musculoskeletal:  []$  Back pain? []$  Joint pain? []$  Muscle pain?   Physical Examination   Vitals:   08/01/22 0851  BP: 121/82  Pulse: 100  Resp: 20  Temp: 98 F (36.7 C)  TempSrc: Temporal  SpO2: 96%  Weight: 107 lb 12.8 oz (48.9 kg)  Height: 5' (1.524 m)   Body mass index is 21.05 kg/m.  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  Fistula has a palpable thrill with some pulsatility Musculoskeletal: no muscle wasting or atrophy  Neurologic: A&O X 3;  No focal weakness or paresthesias are detected Psychiatric:  The pt has Normal affect.   Non-invasive Vascular Imaging   Left Arm Access Duplex  (08/01/2022):  +--------------------+----------+-----------------+--------+  AVF                PSV (cm/s)Flow Vol (mL/min)Comments  +--------------------+----------+-----------------+--------+  Native artery inflow   184          5304                 +--------------------+----------+-----------------+--------+  AVF Anastomosis        198                               +--------------------+----------+-----------------+--------+     +------------+----------+-------------+----------+----------+  OUTFLOW VEINPSV (cm/s)Diameter (cm)Depth (cm) Describe   +------------+----------+-------------+----------+----------+  Prox UA         64        2.48        0.26   aneurysmal  +------------+----------+-------------+----------+----------+  Mid UA          49        2.52        0.28   aneurysmal  +------------+----------+-------------+----------+----------+   Dist UA         51        2.47        0.29   aneurysmal  +------------+----------+-------------+----------+----------+  AC Fossa       158        2.99        0.23   aneurysmal  +------------+----------+-------------+----------+----------+        Summary:  Arteriovenous fistula-Aneurysmal dilatation noted.  Arteriovenous graft-Velocities of less than 100cm/s noted.  Volume flow of 5304 mL/min noted in the inflow artery.     Medical Decision Making   Shalu Jankowski is a 47 y.o. 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  This  fistula required extensive revision with plication in 123456 with Dr. Stanford Breed because it was severely aneurysmal with high output of 4 L/min, causing strain on the heart.  She healed this revision in June 2022 and resumed dialysis through it without issue.  She returns again with the same issues.  Her fistula is severely aneurysmal throughout with flow of 5 L/min.  She has been dealing with likely high output CHF and volume overload, due to the high flow rate of this fistula She has not been having any steal symptoms.  She has a palpable left radial pulse I explained to the patient she will need revision with plication and placement of TDC catheter in the next few weeks with Dr. Stanford Breed.  There is some likelihood that this fistula may fail even with revision.  Hopefully after revision this will improve her cardiac status   Vicente Serene PA-C [facility] [place] Office: [facility] MD: Virl Cagey

## 2022-08-05 ENCOUNTER — Encounter (HOSPITAL_COMMUNITY): Payer: Self-pay | Admitting: Vascular Surgery

## 2022-08-05 NOTE — Progress Notes (Signed)
PCP - Dustin Folks, Brooke Bonito., FNP Cardiologist - Dr Jenkins Rouge  CT Chest x-ray - 06/09/22 EKG - 06/07/22 Stress Test - 04/11/20 CE ECHO - 02/04/22 Cardiac Cath - 07/09/22  ICD Pacemaker/Loop - n/a  Sleep Study -  n/a CPAP - none  Diabetes - n/a  NPO  Anesthesia review: Yes  STOP now taking any Aspirin (unless otherwise instructed by your surgeon), Aleve, Naproxen, Ibuprofen, Motrin, Advil, Goody's, BC's, all herbal medications, fish oil, and all vitamins.   Coronavirus Screening Do you have any of the following symptoms:  Cough yes/no: No Fever (>100.110F)  yes/no: No Runny nose yes/no: No Sore throat yes/no: No Difficulty breathing/shortness of breath  Yes  Have you traveled in the last 14 days and where? yes/no: No  Patient verbalized understanding of instructions that were given via phone.

## 2022-08-05 NOTE — Anesthesia Preprocedure Evaluation (Addendum)
Anesthesia Evaluation  Patient identified by MRN, date of birth, ID band Patient awake    Reviewed: Allergy & Precautions, NPO status , Patient's Chart, lab work & pertinent test results  Airway Mallampati: I  TM Distance: >3 FB Neck ROM: Full    Dental no notable dental hx. (+) Teeth Intact, Dental Advisory Given   Pulmonary Current Smoker   Pulmonary exam normal breath sounds clear to auscultation       Cardiovascular hypertension, +CHF  Normal cardiovascular exam Rhythm:Regular Rate:Normal  RHC 07/09/22: IMPRESSION: 1. Low pre and post capillary filling pressures.  2. Moderately elevated PA mean and PVR  3. Hemodynamics consistent with high output heart failure (CI >4, CO>8).  4. After inflating BP cuff to 100-124mHg there was a reduction in PA pressures. There was a mild reduction in cardiac output from a cardiac index of 5.9 L/min/m2 to 5.3 L/min/m2. Step up in SVC saturation &RA saturation are consistent with high output from AVF.    Echo 02/04/22: IMPRESSIONS  1. Left ventricular ejection fraction, by estimation, is 60 to 65%. The  left ventricle has normal function. The left ventricle has no regional  wall motion abnormalities. There is severe left ventricular hypertrophy.  Left ventricular diastolic parameters  are indeterminate.  2. Right ventricular systolic function is normal. The right ventricular  size is normal. Tricuspid regurgitation signal is inadequate for assessing  PA pressure.  3. Left atrial size was moderately dilated.  4. Right atrial size was moderately dilated.  5. The mitral valve is normal in structure. No evidence of mitral valve  regurgitation.  6. The aortic valve was not well visualized. Aortic valve regurgitation  is not visualized.  7. The inferior vena cava is normal in size with greater than 50%  respiratory variability, suggesting right atrial pressure of 3 mmHg.  -  Comparison(s): No significant change from prior study.     Neuro/Psych  Headaches, Seizures - (in 2020), Well Controlled,  CVA, No Residual Symptoms    GI/Hepatic negative GI ROS, Neg liver ROS,,,  Endo/Other    Renal/GU ESRF and DialysisRenal disease     Musculoskeletal   Abdominal   Peds  Hematology   Anesthesia Other Findings All: visine  Reproductive/Obstetrics                             Anesthesia Physical Anesthesia Plan  ASA: 4  Anesthesia Plan: MAC   Post-op Pain Management: Regional block* and Minimal or no pain anticipated   Induction:   PONV Risk Score and Plan: 2 and Propofol infusion and Treatment may vary due to age or medical condition  Airway Management Planned: Natural Airway and Nasal Cannula  Additional Equipment: None  Intra-op Plan:   Post-operative Plan:   Informed Consent: I have reviewed the patients History and Physical, chart, labs and discussed the procedure including the risks, benefits and alternatives for the proposed anesthesia with the patient or authorized representative who has indicated his/her understanding and acceptance.     Dental advisory given  Plan Discussed with: CRNA, Anesthesiologist and Surgeon  Anesthesia Plan Comments: (PAT note written 08/05/2022 by AMyra Gianotti PA-C.   )       Anesthesia Quick Evaluation

## 2022-08-05 NOTE — Progress Notes (Signed)
Anesthesia Chart Review: Madeline Brown   Case: [id number] Date/Time: 08/06/22 0942   Procedures:      LEFT UPPER EXTREMITY FISTULA REVISION WITH PLICATION (Left)     INSERTION OF TUNNELED DIALYSIS CATHETER   Anesthesia type: Monitor Anesthesia Care   Pre-op diagnosis: ESRD   Location: [facility] OR ROOM 16 / [place]   Surgeons: Cherre Robins, MD       DISCUSSION: Patient is a 47 year old female scheduled for the above procedure. 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  Cardiologist is Dr. Johnsie Cancel, last visit 07/04/22. 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 S/p RHC 07/09/22. Step up in SVC saturation & RA saturation are consistent with high output from AVF and AVF revision needed  Echo 02/04/22 EF 60-65% severe LVH moderate bi atrial enlargement no significant valve disease, no pericardial effusion.     VS: LMP 12/14/2017  BP Readings from Last 3 Encounters:  08/01/22 121/82  07/09/22 (!) 147/76  07/04/22 124/60   Pulse Readings from Last 3 Encounters:  08/01/22 100  07/09/22 84  07/04/22 94     PROVIDERS: Sonia Side., FNP is PCP    LABS: Most recent lab result in [facility] include: Lab Results  Component Value Date   WBC 2.8 (L) 07/04/2022   HGB 11.6 (L) 07/09/2022   HCT 34.0 (L) 07/09/2022   PLT 110 (L) 07/04/2022   GLUCOSE 108 (H) 07/04/2022   NA 140 07/09/2022   K 3.3 (L) 07/09/2022   CL 95 (L) 07/04/2022   CREATININE 6.21 (H) 07/04/2022   BUN 35 (H) 07/04/2022   CO2 29 07/04/2022      IMAGES: CTA Chest 06/09/22: IMPRESSION: 1. Negative for pulmonary embolism. 2. Cardiomegaly and generalized airspace disease favoring edema over pneumonia.  CXR 06/07/22: IMPRESSION: 1. Cardiomegaly and pulmonary edema. 2. Trace bilateral pleural effusions.   EKG: 06/07/22: Sinus rhythm Borderline prolonged PR interval Biatrial enlargement Borderline prolonged QT interval No significant change was found Confirmed by Ezequiel Essex [phone]) on 06/07/2022 6:14:40 PM   CV: RHC 07/09/22: HEMODYNAMICS: RA:                  2 mmHg (mean) RV:                  59/1-5 mmHg PA:                  61/22 mmHg (37 mean) PCWP:            5 mmHg (mean)   TPG                 32  mmHg                                            PVR                 3.7 [place] Units  PAPi                >  10                            Estimated Fick CO/CI   8.5 L/min, 5.9 L/min/m2         Thermodilution CO/CI  8.5 L/min, 5.9 L/min/m2   Patient was given heparin 3000U and blood pressure cuff was placed on AVF at 100-175mHg for 1 minute. Her blood pressure during this time was 143/84    PA: 51/20 (30 mean) Estimated Fick CI: 5.5 L/min/m2 Thermodilution CO/CI 7.6 L/min, 5.29 L/min/m2   O2 sats:  PA 74% IVC: 50% RA:  80% SVC: 81% AO: 92%   IMPRESSION: Low pre and post capillary filling pressures.  Moderately elevated PA mean and PVR  Hemodynamics consistent with high output heart failure (CI >4, CO>8).  After inflating BP cuff to 100-1258mg there was a reduction in PA pressures. There was a mild reduction in cardiac output from a cardiac index of 5.9 L/min/m2 to 5.3 L/min/m2. Step up in SVC saturation & RA saturation are consistent with high output from AVF.     Echo 02/04/22: IMPRESSIONS   1. Left ventricular ejection fraction, by estimation, is 60 to 65%. The  left ventricle has normal function. The left ventricle has no regional  wall motion abnormalities. There is severe left ventricular  hypertrophy.  Left ventricular diastolic parameters   are indeterminate.   2. Right ventricular systolic function is normal. The right ventricular  size is normal. Tricuspid regurgitation signal is inadequate for assessing  PA pressure.   3. Left atrial size was moderately dilated.   4. Right atrial size was moderately dilated.   5. The mitral valve is normal in structure. No evidence of mitral valve  regurgitation.   6. The aortic valve was not well visualized. Aortic valve regurgitation  is not visualized.   7. The inferior vena cava is normal in size with greater than 50%  respiratory variability, suggesting right atrial pressure of 3 mmHg.  - Comparison(s): No significant change from prior study.    [place] 02/21/19: Summary:  - Right Carotid: Velocities in the right ICA are consistent with a 1-39%  stenosis.  - Left Carotid: Velocities in the left ICA are consistent with a 1-39%  stenosis.  - Vertebrals: Bilateral vertebral arteries demonstrate antegrade flow.    Past Medical History:  Diagnosis Date   Anemia    ESRD on HD    Headache, unspecified 09/26/2017   History of blood transfusion 07/2020   Hypertension    Intracranial hemorrhage (HCC)    LVH (left ventricular hypertrophy)    Pericardial effusion    Pulmonary hypertension (HCC)    [place]   Shortness of breath 07/15/2018   01/16/21 not currently   [facility]   Mild was told after having a seizure   [place]ocyt[place] ([place]    Past Surgical History:  Procedure Laterality Date   AV FISTULA PLACEMENT Left 06/24/2017   Procedure: CREATION OF LEFT ARM BRACHIALCEPHALIC  ARTERIOVENOUS (AV) FISTULA;  Surgeon: EaRosetta PosnerMD;  Location: [facility] OR;  Service: Vascular;  Laterality: Left;   CESAREAN SECTION     x2   COMPLEX WOUND CLOSURE Left 07/30/2020   Procedure: COMPLEX WOUND CLOSURE;  Surgeon: HaCherre RobinsMD;  [facility];  Service: Vascular;  Laterality: Left;   FISTULA  SUPERFICIALIZATION Left 10/28/2017   Procedure: SUPERFICIALIZATION LEFT BRACHIOCEPHALIC ARTERIOVENOUS FISTULA;  Surgeon: EaRosetta PosnerMD;  Location: [facility];  Service: Vascular;  Laterality: Left;   INGUINAL HERNIA REPAIR Bilateral 02/01/2021   Procedure: LAPAROSCOPIC BILATERAL INGUINAL HERNIA REPAIR WITH MESH;  Surgeon: Kinsinger, Arta Bruce, MD;  Location: [facility];  Service: General;  Laterality: Bilateral;   INSERTION OF DIALYSIS CATHETER Right 06/24/2017   Procedure: INSERTION OF TUNNELED DIALYSIS CATHETER;  Surgeon: Rosetta Posner, MD;  Location: [place];  Service: Vascular;  Laterality: Right;   INSERTION OF DIALYSIS CATHETER Right 07/30/2020   Procedure: INSERTION OF DIALYSIS CATHETER;  Surgeon: Cherre Robins, MD;  Location: [place];  Service: Vascular;  Laterality: Right;   LIGATION OF COMPETING BRANCHES OF ARTERIOVENOUS FISTULA  10/28/2017   Procedure: LIGATION OF COMPETING BRANCHES OF ARTERIOVENOUS FISTULA x3;  Surgeon: Rosetta Posner, MD;  Location: [place];  Service: Vascular;;   REVISON OF ARTERIOVENOUS FISTULA Left 07/30/2020   Procedure: LEFT UPPER EXTREMITY ARTERIOVENOUS FISTULA REVISON;  Surgeon: Cherre Robins, MD;  Location: [place];  Service: Vascular;  Laterality: Left;  PERIPHERAL NERVE BLOCK   RIGHT HEART CATH N/A 07/09/2022   Procedure: RIGHT HEART CATH;  Surgeon: Hebert Soho, DO;  Location: [facility];  Service: Cardiovascular;  Laterality: N/A;    MEDICATIONS:  sodium chloride flush (NS) 0.9 % injection 3 mL    acetaminophen (TYLENOL) 500 MG tablet   amLODipine (NORVASC) 10 MG tablet   aspirin EC 81 MG tablet   B Complex-C-Folic Acid (DIALYVITE TABLET) TABS   carvedilol (COREG) 25 MG tablet   cinacalcet (SENSIPAR) 30 MG tablet   diphenhydrAMINE HCl (BENADRYL PO)   hydrALAZINE (APRESOLINE) 100 MG tablet   levETIRAcetam (KEPPRA) 1000 MG tablet   OXYGEN   sevelamer carbonate (RENVELA) 800 MG tablet   spironolactone (ALDACTONE) 50 MG tablet    Myra Gianotti,  PA-C Surgical Short Stay/Anesthesiology [facility] Phone [phone] [facility] Phone [phone] 08/05/2022 1:17 PM

## 2022-08-06 ENCOUNTER — Encounter (HOSPITAL_COMMUNITY): Payer: Self-pay | Admitting: Vascular Surgery

## 2022-08-06 ENCOUNTER — Other Ambulatory Visit: Payer: Self-pay

## 2022-08-06 ENCOUNTER — Inpatient Hospital Stay (HOSPITAL_COMMUNITY)
Admission: AD | Admit: 2022-08-06 | Discharge: 2022-08-08 | DRG: 252 | Disposition: A | Discharge status: Home / Self Care | Payer: Medicare HMO | Attending: Internal Medicine | Admitting: Internal Medicine

## 2022-08-06 ENCOUNTER — Ambulatory Visit (HOSPITAL_COMMUNITY): Payer: Medicare HMO

## 2022-08-06 ENCOUNTER — Inpatient Hospital Stay (HOSPITAL_COMMUNITY): Payer: Medicare HMO

## 2022-08-06 ENCOUNTER — Encounter (HOSPITAL_COMMUNITY)
Admission: AD | Disposition: A | Discharge status: Home / Self Care | Payer: Self-pay | Source: Home / Self Care | Attending: Internal Medicine

## 2022-08-06 ENCOUNTER — Ambulatory Visit (HOSPITAL_BASED_OUTPATIENT_CLINIC_OR_DEPARTMENT_OTHER): Payer: Medicare HMO | Admitting: Vascular Surgery

## 2022-08-06 ENCOUNTER — Ambulatory Visit (HOSPITAL_COMMUNITY): Payer: Medicare HMO | Admitting: Vascular Surgery

## 2022-08-06 DIAGNOSIS — D631 Anemia in chronic kidney disease: Secondary | ICD-10-CM | POA: Diagnosis present

## 2022-08-06 DIAGNOSIS — I721 Aneurysm of artery of upper extremity: Secondary | ICD-10-CM | POA: Diagnosis present

## 2022-08-06 DIAGNOSIS — Y838 Other surgical procedures as the cause of abnormal reaction of the patient, or of later complication, without mention of misadventure at the time of the procedure: Secondary | ICD-10-CM | POA: Diagnosis present

## 2022-08-06 DIAGNOSIS — T82898A Other specified complication of vascular prosthetic devices, implants and grafts, initial encounter: Secondary | ICD-10-CM | POA: Diagnosis not present

## 2022-08-06 DIAGNOSIS — Z992 Dependence on renal dialysis: Secondary | ICD-10-CM

## 2022-08-06 DIAGNOSIS — Z8673 Personal history of transient ischemic attack (TIA), and cerebral infarction without residual deficits: Secondary | ICD-10-CM

## 2022-08-06 DIAGNOSIS — R0902 Hypoxemia: Secondary | ICD-10-CM | POA: Diagnosis present

## 2022-08-06 DIAGNOSIS — I509 Heart failure, unspecified: Secondary | ICD-10-CM

## 2022-08-06 DIAGNOSIS — I130 Hypertensive heart and chronic kidney disease with heart failure and stage 1 through stage 4 chronic kidney disease, or unspecified chronic kidney disease: Secondary | ICD-10-CM

## 2022-08-06 DIAGNOSIS — F17210 Nicotine dependence, cigarettes, uncomplicated: Secondary | ICD-10-CM | POA: Diagnosis present

## 2022-08-06 DIAGNOSIS — I5033 Acute on chronic diastolic (congestive) heart failure: Secondary | ICD-10-CM | POA: Diagnosis present

## 2022-08-06 DIAGNOSIS — I272 Pulmonary hypertension, unspecified: Secondary | ICD-10-CM | POA: Diagnosis present

## 2022-08-06 DIAGNOSIS — N186 End stage renal disease: Secondary | ICD-10-CM | POA: Diagnosis present

## 2022-08-06 DIAGNOSIS — F329 Major depressive disorder, single episode, unspecified: Secondary | ICD-10-CM | POA: Diagnosis present

## 2022-08-06 DIAGNOSIS — Z833 Family history of diabetes mellitus: Secondary | ICD-10-CM | POA: Diagnosis not present

## 2022-08-06 DIAGNOSIS — Z8249 Family history of ischemic heart disease and other diseases of the circulatory system: Secondary | ICD-10-CM

## 2022-08-06 DIAGNOSIS — D62 Acute posthemorrhagic anemia: Secondary | ICD-10-CM | POA: Diagnosis not present

## 2022-08-06 DIAGNOSIS — I132 Hypertensive heart and chronic kidney disease with heart failure and with stage 5 chronic kidney disease, or end stage renal disease: Secondary | ICD-10-CM | POA: Diagnosis present

## 2022-08-06 DIAGNOSIS — R569 Unspecified convulsions: Secondary | ICD-10-CM | POA: Diagnosis present

## 2022-08-06 DIAGNOSIS — T82510A Breakdown (mechanical) of surgically created arteriovenous fistula, initial encounter: Secondary | ICD-10-CM | POA: Diagnosis present

## 2022-08-06 DIAGNOSIS — I5083 High output heart failure: Secondary | ICD-10-CM | POA: Diagnosis present

## 2022-08-06 DIAGNOSIS — N250 Renal osteodystrophy: Secondary | ICD-10-CM | POA: Diagnosis present

## 2022-08-06 DIAGNOSIS — Z79899 Other long term (current) drug therapy: Secondary | ICD-10-CM | POA: Diagnosis not present

## 2022-08-06 DIAGNOSIS — Z7982 Long term (current) use of aspirin: Secondary | ICD-10-CM | POA: Diagnosis not present

## 2022-08-06 HISTORY — PX: INSERTION OF DIALYSIS CATHETER: SHX1324

## 2022-08-06 HISTORY — DX: Pneumonia, unspecified organism: J18.9

## 2022-08-06 HISTORY — DX: Other psychoactive substance abuse, uncomplicated: F19.10

## 2022-08-06 HISTORY — PX: REVISON OF ARTERIOVENOUS FISTULA: SHX6074

## 2022-08-06 LAB — BLOOD GAS, ARTERIAL
Acid-Base Excess: 4.7 mmol/L — ABNORMAL HIGH (ref 0.0–2.0)
Bicarbonate: 30.9 mmol/L — ABNORMAL HIGH (ref 20.0–28.0)
O2 Saturation: 82.9 %
Patient temperature: 37
pCO2 arterial: 51 mmHg — ABNORMAL HIGH (ref 32–48)
pH, Arterial: 7.39 (ref 7.35–7.45)
pO2, Arterial: 57 mmHg — ABNORMAL LOW (ref 83–108)

## 2022-08-06 LAB — POCT I-STAT, CHEM 8
BUN: 40 mg/dL — ABNORMAL HIGH (ref 6–20)
Calcium, Ion: 1.16 mmol/L (ref 1.15–1.40)
Chloride: 96 mmol/L — ABNORMAL LOW (ref 98–111)
Creatinine, Ser: 6.7 mg/dL — ABNORMAL HIGH (ref 0.44–1.00)
Glucose, Bld: 115 mg/dL — ABNORMAL HIGH (ref 70–99)
HCT: 24 % — ABNORMAL LOW (ref 36.0–46.0)
Hemoglobin: 8.2 g/dL — ABNORMAL LOW (ref 12.0–15.0)
Potassium: 3.6 mmol/L (ref 3.5–5.1)
Sodium: 137 mmol/L (ref 135–145)
TCO2: 29 mmol/L (ref 22–32)

## 2022-08-06 LAB — HEPATITIS B SURFACE ANTIGEN: Hepatitis B Surface Ag: NONREACTIVE

## 2022-08-06 SURGERY — REVISON OF ARTERIOVENOUS FISTULA
Anesthesia: Monitor Anesthesia Care | Site: Chest | Laterality: Right

## 2022-08-06 MED ORDER — HYDROMORPHONE HCL 1 MG/ML IJ SOLN: 0.5000 mg | INTRAMUSCULAR | Status: DC | PRN

## 2022-08-06 MED ORDER — ACETAMINOPHEN 325 MG PO TABS: 650.0000 mg | ORAL_TABLET | Freq: Four times a day (QID) | ORAL | Status: DC | PRN

## 2022-08-06 MED ORDER — MIDAZOLAM HCL 2 MG/2ML IJ SOLN
INTRAMUSCULAR | Status: DC | PRN
  Administered 2022-08-06: 2 mg via INTRAVENOUS

## 2022-08-06 MED ORDER — LIDOCAINE-EPINEPHRINE (PF) 1 %-1:200000 IJ SOLN
INTRAMUSCULAR | Status: AC
  Filled 2022-08-06: qty 30

## 2022-08-06 MED ORDER — HEPARIN SODIUM (PORCINE) 1000 UNIT/ML IJ SOLN
INTRAMUSCULAR | Status: AC
  Filled 2022-08-06: qty 10

## 2022-08-06 MED ORDER — DEXAMETHASONE SODIUM PHOSPHATE 10 MG/ML IJ SOLN
INTRAMUSCULAR | Status: AC
  Filled 2022-08-06: qty 2

## 2022-08-06 MED ORDER — FENTANYL CITRATE (PF) 100 MCG/2ML IJ SOLN
25.0000 ug | INTRAMUSCULAR | Status: DC | PRN
  Administered 2022-08-06: 50 ug via INTRAVENOUS
  Administered 2022-08-06 (×2): 25 ug via INTRAVENOUS

## 2022-08-06 MED ORDER — LIDOCAINE 2% (20 MG/ML) 5 ML SYRINGE
INTRAMUSCULAR | Status: DC | PRN
  Administered 2022-08-06: 40 mg via INTRAVENOUS

## 2022-08-06 MED ORDER — ONDANSETRON HCL 4 MG/2ML IJ SOLN
INTRAMUSCULAR | Status: DC | PRN
  Administered 2022-08-06: 4 mg via INTRAVENOUS

## 2022-08-06 MED ORDER — DEXAMETHASONE SODIUM PHOSPHATE 10 MG/ML IJ SOLN
INTRAMUSCULAR | Status: DC | PRN
  Administered 2022-08-06: 4 mg via INTRAVENOUS

## 2022-08-06 MED ORDER — HYDRALAZINE HCL 50 MG PO TABS
100.0000 mg | ORAL_TABLET | Freq: Three times a day (TID) | ORAL | Status: DC
  Administered 2022-08-06 – 2022-08-07 (×2): 100 mg via ORAL
  Filled 2022-08-06 (×5): qty 2

## 2022-08-06 MED ORDER — ONDANSETRON HCL 4 MG/2ML IJ SOLN
INTRAMUSCULAR | Status: AC
  Filled 2022-08-06: qty 4

## 2022-08-06 MED ORDER — HEPARIN 6000 UNIT IRRIGATION SOLUTION
Status: AC
  Filled 2022-08-06: qty 500

## 2022-08-06 MED ORDER — SUCCINYLCHOLINE CHLORIDE 200 MG/10ML IV SOSY
PREFILLED_SYRINGE | INTRAVENOUS | Status: AC
  Filled 2022-08-06: qty 10

## 2022-08-06 MED ORDER — CHLORHEXIDINE GLUCONATE CLOTH 2 % EX PADS
6.0000 | MEDICATED_PAD | Freq: Every day | CUTANEOUS | Status: DC
  Administered 2022-08-07: 6 via TOPICAL

## 2022-08-06 MED ORDER — FENTANYL CITRATE (PF) 100 MCG/2ML IJ SOLN
INTRAMUSCULAR | Status: AC
  Filled 2022-08-06: qty 2

## 2022-08-06 MED ORDER — HYDRALAZINE HCL 20 MG/ML IJ SOLN: 10.0000 mg | Freq: Four times a day (QID) | INTRAMUSCULAR | Status: DC | PRN

## 2022-08-06 MED ORDER — FUROSEMIDE 10 MG/ML IJ SOLN
40.0000 mg | Freq: Once | INTRAMUSCULAR | Status: AC
  Administered 2022-08-06: 40 mg via INTRAVENOUS
  Filled 2022-08-06: qty 4

## 2022-08-06 MED ORDER — OXYCODONE-ACETAMINOPHEN 5-325 MG PO TABS: 1.0000 | ORAL_TABLET | Freq: Four times a day (QID) | ORAL | 0 refills | Status: DC | PRN

## 2022-08-06 MED ORDER — SENNOSIDES-DOCUSATE SODIUM 8.6-50 MG PO TABS: 1.0000 | ORAL_TABLET | Freq: Every evening | ORAL | Status: DC | PRN

## 2022-08-06 MED ORDER — MIDAZOLAM HCL 2 MG/2ML IJ SOLN
INTRAMUSCULAR | Status: AC
  Filled 2022-08-06: qty 2

## 2022-08-06 MED ORDER — LIDOCAINE 2% (20 MG/ML) 5 ML SYRINGE
INTRAMUSCULAR | Status: AC
  Filled 2022-08-06: qty 10

## 2022-08-06 MED ORDER — SPIRONOLACTONE 25 MG PO TABS
50.0000 mg | ORAL_TABLET | Freq: Every day | ORAL | Status: DC
  Administered 2022-08-06 – 2022-08-08 (×3): 50 mg via ORAL
  Filled 2022-08-06 (×3): qty 2

## 2022-08-06 MED ORDER — CARVEDILOL 25 MG PO TABS: 25.0000 mg | ORAL_TABLET | Freq: Two times a day (BID) | ORAL | Status: DC

## 2022-08-06 MED ORDER — ROCURONIUM BROMIDE 10 MG/ML (PF) SYRINGE
PREFILLED_SYRINGE | INTRAVENOUS | Status: AC
  Filled 2022-08-06: qty 10

## 2022-08-06 MED ORDER — 0.9 % SODIUM CHLORIDE (POUR BTL) OPTIME
TOPICAL | Status: DC | PRN
  Administered 2022-08-06: 1000 mL

## 2022-08-06 MED ORDER — OXYCODONE HCL 5 MG PO TABS
5.0000 mg | ORAL_TABLET | ORAL | Status: DC | PRN
  Administered 2022-08-06 – 2022-08-08 (×6): 10 mg via ORAL
  Filled 2022-08-06 (×6): qty 2

## 2022-08-06 MED ORDER — LIDOCAINE-EPINEPHRINE (PF) 1 %-1:200000 IJ SOLN
INTRAMUSCULAR | Status: DC | PRN
  Administered 2022-08-06: 25 mL

## 2022-08-06 MED ORDER — ACETAMINOPHEN 10 MG/ML IV SOLN
INTRAVENOUS | Status: AC
  Filled 2022-08-06: qty 100

## 2022-08-06 MED ORDER — SODIUM CHLORIDE 0.9 % IV SOLN: INTRAVENOUS | Status: DC

## 2022-08-06 MED ORDER — HEPARIN SODIUM (PORCINE) 1000 UNIT/ML IJ SOLN
INTRAMUSCULAR | Status: DC | PRN
  Administered 2022-08-06: 3200 [IU]

## 2022-08-06 MED ORDER — AMLODIPINE BESYLATE 10 MG PO TABS
10.0000 mg | ORAL_TABLET | Freq: Every day | ORAL | Status: DC
  Administered 2022-08-06 – 2022-08-07 (×2): 10 mg via ORAL
  Filled 2022-08-06 (×2): qty 1

## 2022-08-06 MED ORDER — CHLORHEXIDINE GLUCONATE 4 % EX LIQD: 60.0000 mL | Freq: Once | CUTANEOUS | Status: DC

## 2022-08-06 MED ORDER — HEPARIN 6000 UNIT IRRIGATION SOLUTION
Status: DC | PRN
  Administered 2022-08-06: 1

## 2022-08-06 MED ORDER — CARVEDILOL 25 MG PO TABS
25.0000 mg | ORAL_TABLET | Freq: Two times a day (BID) | ORAL | Status: DC
  Administered 2022-08-07 (×2): 25 mg via ORAL
  Filled 2022-08-06 (×2): qty 1

## 2022-08-06 MED ORDER — ACETAMINOPHEN 325 MG PO TABS
650.0000 mg | ORAL_TABLET | Freq: Four times a day (QID) | ORAL | Status: DC
  Administered 2022-08-07 – 2022-08-08 (×7): 650 mg via ORAL
  Filled 2022-08-06 (×7): qty 2

## 2022-08-06 MED ORDER — ACETAMINOPHEN 650 MG RE SUPP: 650.0000 mg | Freq: Four times a day (QID) | RECTAL | Status: DC | PRN

## 2022-08-06 MED ORDER — PROPOFOL 10 MG/ML IV BOLUS
INTRAVENOUS | Status: AC
  Filled 2022-08-06: qty 20

## 2022-08-06 MED ORDER — PROPOFOL 500 MG/50ML IV EMUL
INTRAVENOUS | Status: DC | PRN
  Administered 2022-08-06: 75 ug/kg/min via INTRAVENOUS

## 2022-08-06 MED ORDER — HEPARIN SODIUM (PORCINE) 5000 UNIT/ML IJ SOLN
5000.0000 [IU] | Freq: Three times a day (TID) | INTRAMUSCULAR | Status: DC
  Administered 2022-08-06 – 2022-08-08 (×4): 5000 [IU] via SUBCUTANEOUS
  Filled 2022-08-06 (×6): qty 1

## 2022-08-06 MED ORDER — PROPOFOL 10 MG/ML IV BOLUS
INTRAVENOUS | Status: DC | PRN
  Administered 2022-08-06: 20 mg via INTRAVENOUS
  Administered 2022-08-06 (×3): 30 mg via INTRAVENOUS

## 2022-08-06 MED ORDER — LEVETIRACETAM 500 MG PO TABS
1000.0000 mg | ORAL_TABLET | Freq: Two times a day (BID) | ORAL | Status: DC
  Filled 2022-08-06: qty 2

## 2022-08-06 MED ORDER — CHLORHEXIDINE GLUCONATE 0.12 % MT SOLN
OROMUCOSAL | Status: AC
  Administered 2022-08-06: 15 mL
  Filled 2022-08-06: qty 15

## 2022-08-06 MED ORDER — FENTANYL CITRATE (PF) 250 MCG/5ML IJ SOLN
INTRAMUSCULAR | Status: DC | PRN
  Administered 2022-08-06 (×3): 25 ug via INTRAVENOUS

## 2022-08-06 MED ORDER — ALBUTEROL SULFATE (2.5 MG/3ML) 0.083% IN NEBU
2.5000 mg | INHALATION_SOLUTION | Freq: Four times a day (QID) | RESPIRATORY_TRACT | Status: DC
  Filled 2022-08-06 (×3): qty 3

## 2022-08-06 MED ORDER — PAPAVERINE HCL 30 MG/ML IJ SOLN
INTRAMUSCULAR | Status: AC
  Filled 2022-08-06: qty 2

## 2022-08-06 MED ORDER — FENTANYL CITRATE (PF) 250 MCG/5ML IJ SOLN
INTRAMUSCULAR | Status: AC
  Filled 2022-08-06: qty 5

## 2022-08-06 MED ORDER — ONDANSETRON HCL 4 MG/2ML IJ SOLN: 4.0000 mg | Freq: Once | INTRAMUSCULAR | Status: DC | PRN

## 2022-08-06 MED ORDER — CEFAZOLIN SODIUM-DEXTROSE 2-4 GM/100ML-% IV SOLN
2.0000 g | INTRAVENOUS | Status: AC
  Administered 2022-08-06: 2 g via INTRAVENOUS
  Filled 2022-08-06: qty 100

## 2022-08-06 MED ORDER — ACETAMINOPHEN 10 MG/ML IV SOLN
650.0000 mg | Freq: Once | INTRAVENOUS | Status: DC | PRN
  Administered 2022-08-06: 650 mg via INTRAVENOUS

## 2022-08-06 SURGICAL SUPPLY — 60 items
ARMBAND PINK RESTRICT EXTREMIT (MISCELLANEOUS) ×2 IMPLANT
BAG COUNTER SPONGE SURGICOUNT (BAG) ×2 IMPLANT
BAG DECANTER FOR FLEXI CONT (MISCELLANEOUS) ×2 IMPLANT
BENZOIN TINCTURE PRP APPL 2/3 (GAUZE/BANDAGES/DRESSINGS) ×2 IMPLANT
BIOPATCH RED 1 DISK 7.0 (GAUZE/BANDAGES/DRESSINGS) ×2 IMPLANT
BNDG COHESIVE 4X5 TAN STRL LF (GAUZE/BANDAGES/DRESSINGS) IMPLANT
BNDG GAUZE DERMACEA FLUFF 4 (GAUZE/BANDAGES/DRESSINGS) IMPLANT
CANISTER SUCT 3000ML PPV (MISCELLANEOUS) ×2 IMPLANT
CANNULA VESSEL 3MM 2 BLNT TIP (CANNULA) ×2 IMPLANT
CATH PALINDROME-P 19CM W/VT (CATHETERS) IMPLANT
CATH PALINDROME-P 23CM W/VT (CATHETERS) IMPLANT
CATH PALINDROME-P 28CM W/VT (CATHETERS) IMPLANT
CHLORAPREP W/TINT 26 (MISCELLANEOUS) ×2 IMPLANT
COVER PROBE W GEL 5X96 (DRAPES) ×2 IMPLANT
COVER SURGICAL LIGHT HANDLE (MISCELLANEOUS) ×2 IMPLANT
DERMABOND ADVANCED .7 DNX12 (GAUZE/BANDAGES/DRESSINGS) IMPLANT
DRAPE C-ARM 42X72 X-RAY (DRAPES) ×2 IMPLANT
DRAPE CHEST BREAST 15X10 FENES (DRAPES) ×2 IMPLANT
ELECT REM PT RETURN 9FT ADLT (ELECTROSURGICAL) ×2
ELECTRODE REM PT RTRN 9FT ADLT (ELECTROSURGICAL) ×2 IMPLANT
GAUZE 4X4 16PLY ~~LOC~~+RFID DBL (SPONGE) ×2 IMPLANT
GAUZE SPONGE 4X4 12PLY STRL (GAUZE/BANDAGES/DRESSINGS) ×2 IMPLANT
GLOVE BIO SURGEON STRL SZ8 (GLOVE) ×2 IMPLANT
GOWN STRL REUS W/ TWL LRG LVL3 (GOWN DISPOSABLE) ×4 IMPLANT
GOWN STRL REUS W/ TWL XL LVL3 (GOWN DISPOSABLE) ×2 IMPLANT
GOWN STRL REUS W/TWL LRG LVL3 (GOWN DISPOSABLE) ×4
GOWN STRL REUS W/TWL XL LVL3 (GOWN DISPOSABLE) ×2
INSERT FOGARTY SM (MISCELLANEOUS) IMPLANT
KIT BASIN OR (CUSTOM PROCEDURE TRAY) ×2 IMPLANT
KIT PALINDROME-P 55CM (CATHETERS) IMPLANT
KIT TURNOVER KIT B (KITS) ×2 IMPLANT
NDL 18GX1X1/2 (RX/OR ONLY) (NEEDLE) ×2 IMPLANT
NDL HYPO 25GX1X1/2 BEV (NEEDLE) ×2 IMPLANT
NEEDLE 18GX1X1/2 (RX/OR ONLY) (NEEDLE) ×2 IMPLANT
NEEDLE HYPO 25GX1X1/2 BEV (NEEDLE) ×2 IMPLANT
NS IRRIG 1000ML POUR BTL (IV SOLUTION) ×2 IMPLANT
PACK BASIC III (CUSTOM PROCEDURE TRAY) ×2
PACK CV ACCESS (CUSTOM PROCEDURE TRAY) ×2 IMPLANT
PACK SRG BSC III STRL LF ECLPS (CUSTOM PROCEDURE TRAY) ×2 IMPLANT
PAD ARMBOARD 7.5X6 YLW CONV (MISCELLANEOUS) ×4 IMPLANT
SET MICROPUNCTURE 5F STIFF (MISCELLANEOUS) ×2 IMPLANT
SLING ARM FOAM STRAP LRG (SOFTGOODS) IMPLANT
SLING ARM FOAM STRAP MED (SOFTGOODS) IMPLANT
SOAP 2 % CHG 4 OZ (WOUND CARE) ×2 IMPLANT
STRIP CLOSURE SKIN 1/2X4 (GAUZE/BANDAGES/DRESSINGS) ×2 IMPLANT
SUT ETHILON 3 0 PS 1 (SUTURE) ×2 IMPLANT
SUT MNCRL AB 4-0 PS2 18 (SUTURE) ×2 IMPLANT
SUT PROLENE 5 0 C 1 24 (SUTURE) IMPLANT
SUT PROLENE 6 0 BV (SUTURE) ×2 IMPLANT
SUT VIC AB 3-0 SH 27 (SUTURE) ×2
SUT VIC AB 3-0 SH 27X BRD (SUTURE) ×2 IMPLANT
SYR 10ML LL (SYRINGE) ×2 IMPLANT
SYR 20ML LL LF (SYRINGE) ×4 IMPLANT
SYR 3ML LL SCALE MARK (SYRINGE) ×2 IMPLANT
SYR 5ML LL (SYRINGE) ×2 IMPLANT
SYR CONTROL 10ML LL (SYRINGE) ×2 IMPLANT
TOWEL GREEN STERILE (TOWEL DISPOSABLE) ×2 IMPLANT
TOWEL GREEN STERILE FF (TOWEL DISPOSABLE) ×4 IMPLANT
UNDERPAD 30X36 HEAVY ABSORB (UNDERPADS AND DIAPERS) ×2 IMPLANT
WATER STERILE IRR 1000ML POUR (IV SOLUTION) ×2 IMPLANT

## 2022-08-06 NOTE — Anesthesia Postprocedure Evaluation (Signed)
Anesthesia Post Note  Patient: Madeline Brown  Procedure(s) Performed: LEFT UPPER EXTREMITY FISTULA LIGATION (Left: Arm Upper) INSERTION OF TUNNELED DIALYSIS CATHETER (Right: Chest)     Patient location during evaluation: PACU Anesthesia Type: MAC Level of consciousness: awake and alert Pain management: pain level controlled Vital Signs Assessment: post-procedure vital signs reviewed and stable Respiratory status: spontaneous breathing, nonlabored ventilation, respiratory function stable and patient connected to nasal cannula oxygen Cardiovascular status: stable and blood pressure returned to baseline Postop Assessment: no apparent nausea or vomiting Anesthetic complications: no  No notable events documented.  Last Vitals:  Vitals:   08/06/22 1515 08/06/22 1526  BP: 117/83   Pulse: 76 74  Resp: 20 14  Temp:    SpO2: (!) 89% 94%    Last Pain:  Vitals:   08/06/22 1526  TempSrc:   PainSc: 7                  Barnet Glasgow

## 2022-08-06 NOTE — Op Note (Signed)
DATE OF SERVICE: 08/06/2022  PATIENT:  Madeline Brown  47 y.o. female  PRE-OPERATIVE DIAGNOSIS:  ESRD; high flow AVF and HF.  POST-OPERATIVE DIAGNOSIS:  Same  PROCEDURE:   1) placement of right internal jugular tunneled dialysis catheter (19cm) 2) ligation of left arm arteriovenous fistula  SURGEON:  Surgeon(s) and Role:    * Cherre Robins, MD - Primary  ASSISTANT: Leontine Locket, PA-C  An experienced assistant was required given the complexity of this procedure and the standard of surgical care. My assistant helped with exposure through counter tension, suctioning, ligation and retraction to better visualize the surgical field.  My assistant expedited sewing during the case by following my sutures. Wherever I use the term "we" in the report, my assistant actively helped me with that portion of the procedure.  ANESTHESIA:   local and MAC  EBL: minimal  BLOOD ADMINISTERED:none  DRAINS: none   LOCAL MEDICATIONS USED:  LIDOCAINE   SPECIMEN:  none  COUNTS: confirmed correct.  TOURNIQUET:  none  PATIENT DISPOSITION:  PACU - hemodynamically stable.   Delay start of Pharmacological VTE agent (>24hrs) due to surgical blood loss or risk of bleeding: no  INDICATION FOR PROCEDURE: Madeline Brown is a 47 y.o. female with ESRD and HF. She has a high flow fistula which I previously plicated to help reduce the flow and offload her heart. This has recurred. Recent cardiac catheterization demonstrated high output heart failure and improvement in hemodynamics with occlusion of fistula. After careful discussion of risks, benefits, and alternatives the patient was offered ligation of the fistula and placement of tunneled dialysis catheter. The patient understood and wished to proceed.  OPERATIVE FINDINGS: unremarkable TDC. Fistula ligated proximally and distally to avoid thrombophlebitis. Palpable pulse in wrist at completion.  DESCRIPTION OF PROCEDURE: After identification of the patient in  the pre-operative holding area, the patient was transferred to the operating room. The patient was positioned supine on the operating room table. Anesthesia was induced. The neck and left arm were prepped and draped in standard fashion. A surgical pause was performed confirming correct patient, procedure, and operative location.  Using ultrasound guidance the right internal jugular vein was accessed with micropuncture technique.  Through the micropuncture sheath a floppy J-wire was advanced into the superior vena cava.  A small incision was made around the skin access point.  The access point was serially dilated under direct fluoroscopic guidance.  A peel-away sheath was introduced into the superior vena cava under fluoroscopic guidance.  A counterincision was made in the chest under the clavicle.  A 23 cm tunnel dialysis catheter was then tunneled under the skin, over the clavicle into the incision in the neck.  The tunneling device was removed and the catheter fed through the peel-away sheath into the superior vena cava.  The peel-away sheath was removed and the catheter gently pulled back.  Adequate position was confirmed with x-ray.  The catheter was tested and found to flush and draw back well.  Catheter was heparin locked.  Caps were applied.  Catheter was sutured to the skin.  The neck incision was closed with 4-0 Monocryl.  Longitudinal incision was made near the fistula anastomosis. The proximal fistula was skeletonized. A Gregory clamp was applied to the proximal fistula inflow. Through a separate incision the distal fistula at the shoulder was skeletonized. A gregory clamp was applied to the distal fistula outflow. The fistula was transected proximally. The fistula was oversewn in two layers using 5-O prolene. Hemostasis was achieved.  The fistula was transected distally. The fistula was oversewn in two layers using 5-O prolene. All blood was expressed from the intervening segment. Bandages were  applied.   Upon completion of the case instrument and sharps counts were confirmed correct. The patient was transferred to the PACU in good condition. I was present for all portions of the procedure.  Yevonne Aline. Stanford Breed, MD [facility] Phone Number: [phone] 08/06/2022 3:05 PM

## 2022-08-06 NOTE — H&P (Signed)
[facility] History and Physical  Madeline Brown [id number] DOB: 24-Jun-1975 DOA: 08/06/2022 PCP: Sonia Side., FNP  Admitted from: home Chief Complaint: post procedure dyspnea  History of Present Illness: Madeline Brown is a 47 y.o. female with PMH significant for ESRD-HD-TTS, diastolic CHF, HTN, pulmonary hypertension, history of IVH. 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  She underwent extensive excision and plication of the fistula and placement of right IJ [facility] in February 2022.  Right IJ TDC was also removed in June 2022. Lately, patient was having similar issues with her fistula again.  She had severe aneurysmal dilation of the fistula leading to high-output CHF.  She was recently hospitalized 12/23-12/26 for shortness of breath, hypoxia.  Chest x-ray showed pulm edema which improved with dialysis.  Today, patient presented for elective ligation of the left arm AV fistula..  She also had right IJ tunneled dialysis catheter placed. Postprocedure in the recovery, patient was short of and required up to 10 L of oxygen.  It was gradually weaned down to 3 L/min. Blood gas was obtained which showed pH of 7.39, pCO2 51, pO2 57, bicarb 31 Chest x-ray was obtained which showed cardiomegaly and bilateral lung opacities consistent with edema. Hospitalist service was consulted for overnight observation and management. Last dialysis was yesterday 2/20.  At the time of my evaluation, patient was propped up in PACU bed.  On 3 L oxygen nasal cannula.  Heart rate in 100s.  Not in distress.  Taking her dinner. No family at bedside. Patient confirmed he still makes urine.   Review of Systems:  All systems were reviewed and were negative unless otherwise mentioned in the HPI   Past medical history: Past Medical History:  Diagnosis Date   Anemia    ESRD on HD    T-TH-S dialysis    Headache, unspecified 09/26/2017   History of blood transfusion 07/2020   Hypertension    Intracranial hemorrhage (HCC)    LVH (left ventricular hypertrophy)    Pericardial effusion    Pneumonia    Pulmonary hypertension ([place])    Seizure ([place]) 02/2019   last one was in 02/2019   Shortness of breath 07/15/2018   on 3L via [place] prn   Stroke ([place])    Mild was told after having a seizure   Substance abuse ([place])    Marijuana   Thrombocytopenia ([place])     Past surgical history: Past Surgical History:  Procedure Laterality Date   AV FISTULA PLACEMENT Left 06/24/2017   Procedure: CREATION OF LEFT ARM BRACHIALCEPHALIC  ARTERIOVENOUS (AV) FISTULA;  Surgeon: Rosetta Posner, MD;  Location: [place];  Service: Vascular;  Laterality: Left;   CESAREAN SECTION     x2   COMPLEX WOUND CLOSURE Left 07/30/2020   Procedure: COMPLEX WOUND CLOSURE;  Surgeon: Cherre Robins, MD;  Location: [place];  Service: Vascular;  Laterality: Left;   FISTULA SUPERFICIALIZATION Left 10/28/2017   Procedure: SUPERFICIALIZATION LEFT BRACHIOCEPHALIC ARTERIOVENOUS FISTULA;  Surgeon: Rosetta Posner, MD;  Location: [place];  Service: Vascular;  Laterality: Left;   INGUINAL HERNIA REPAIR Bilateral 02/01/2021   Procedure: LAPAROSCOPIC BILATERAL INGUINAL HERNIA REPAIR WITH MESH;  Surgeon: Kinsinger, Arta Bruce, MD;  Location: [facility];  Service: General;  Laterality: Bilateral;   INSERTION OF DIALYSIS CATHETER Right 06/24/2017   Procedure: INSERTION OF TUNNELED DIALYSIS CATHETER;  Surgeon: Rosetta Posner, MD;  Location: [place];  Service: Vascular;  Laterality: Right;   INSERTION OF DIALYSIS CATHETER Right 07/30/2020   Procedure: INSERTION OF DIALYSIS CATHETER;  Surgeon: Cherre Robins, MD;  [facility];  Service: Vascular;  Laterality: Right;   LIGATION OF COMPETING BRANCHES OF ARTERIOVENOUS FISTULA  10/28/2017   Procedure: LIGATION OF COMPETING BRANCHES OF ARTERIOVENOUS FISTULA x3;  Surgeon: Rosetta Posner, MD;  Location: [place];  Service:  Vascular;;   REVISON OF ARTERIOVENOUS FISTULA Left 07/30/2020   Procedure: LEFT UPPER EXTREMITY ARTERIOVENOUS FISTULA REVISON;  Surgeon: Cherre Robins, MD;  Location: [place];  Service: Vascular;  Laterality: Left;  PERIPHERAL NERVE BLOCK   RIGHT HEART CATH N/A 07/09/2022   Procedure: RIGHT HEART CATH;  Surgeon: Hebert Soho, DO;  Location: [place] [facility];  Service: Cardiovascular;  Laterality: N/A;    Social History:  reports that she has been smoking cigarettes. She started smoking about 28 years ago. She has a 13.50 pack-year smoking history. She has been exposed to tobacco smoke. She has never used smokeless tobacco. She reports that she does not currently use alcohol. She reports current drug use. Drug: Marijuana.  Allergies:  Allergies  Allergen Reactions   Visine [Tetrahydrozoline Hcl] Swelling and Other (See Comments)    Eyes Swell   Visine [tetrahydrozoline hcl]   Family history:  Family History  Problem Relation Age of Onset   Diabetes Father    Hypertension Father    Cancer Father    Heart disease Father    Aneurysm Mother    Seizures Mother      Home Meds: Prior to Admission medications   Medication Sig Start Date End Date Taking? Authorizing Provider  acetaminophen (TYLENOL) 500 MG tablet Take 500-1,000 mg by mouth every 6 (six) hours as needed for mild pain or headache.   Yes [provider]  amLODipine (NORVASC) 10 MG tablet Take 1 tablet (10 mg total) by mouth at bedtime. 12/09/19 06/08/23 Yes Aslam, Loralyn Freshwater, MD  aspirin EC 81 MG tablet Take 81 mg by mouth daily as needed (chest pain). Swallow whole.   Yes [provider]  B Complex-C-Folic Acid (DIALYVITE TABLET) TABS Take 1 tablet by mouth daily. 07/29/22  Yes [provider]  cinacalcet (SENSIPAR) 30 MG tablet Take 30 mg by mouth daily with breakfast. 02/06/22  Yes [provider]  diphenhydrAMINE HCl (BENADRYL PO) Take 2 tablets by mouth Every Tuesday,Thursday,and  Saturday with dialysis. As needed   Yes [provider]  hydrALAZINE (APRESOLINE) 100 MG tablet Take 1 tablet (100 mg total) by mouth 3 (three) times daily. 12/09/19 01/30/23 Yes Aslam, Loralyn Freshwater, MD  levETIRAcetam (KEPPRA) 1000 MG tablet Take 1 tablet (1,000 mg total) by mouth 2 (two) times daily. Patient taking differently: Take 1,000 mg by mouth in the morning. 12/07/20  Yes Penumalli, Earlean Polka, MD  oxyCODONE-acetaminophen (PERCOCET) 5-325 MG tablet Take 1 tablet by mouth every 6 (six) hours as needed for severe pain. 08/06/22  Yes Rhyne, Samantha J, PA-C  OXYGEN Inhale 3 L into the lungs continuous.   Yes [provider]  sevelamer carbonate (RENVELA) 800 MG tablet Take 2 tablets (1,600 mg total) by mouth 3 (three) times daily with meals. Patient taking differently: Take 1,600 mg by mouth See admin instructions. Take 1,600 mg by mouth three times a day with meals and 800 mg with each snack 02/21/19  Yes Lyndal Pulley, MD  spironolactone (ALDACTONE) 50 MG tablet Take 1 tablet (50 mg total) by mouth daily. 12/09/19  Yes Harvie Heck, MD  carvedilol (COREG) 25 MG tablet Take 1 tablet (25 mg total) by mouth 2 (two) times daily with a meal. Take 25 mg by mouth at 6:30 AM and 6:00 PM on Sun/Mon/Wed/Fri and 25 mg at 11:00 AM and 8:00 PM on Tues/Thurs/Sat 08/06/22 09/05/22  Gabriel Earing, PA-C    Physical Exam: Vitals:   08/06/22 1645 08/06/22 1700 08/06/22 1715 08/06/22 1730  BP: 120/85 117/86 107/76 (!) 123/92  Pulse: 78 83 89 91  Resp: 19 15 17 $ (!) 23  Temp:      TempSrc:      SpO2: 93% 90% (!) 81% (!) 82%  Weight:      Height:       Wt Readings from Last 3 Encounters:  08/06/22 48.6 kg  08/01/22 48.9 kg  07/09/22 49 kg   Body mass index is 20.93 kg/m.  General exam: Pleasant, middle-aged African-American female.  Not in physical distress Skin: No rashes, lesions or ulcers. HEENT: Atraumatic, normocephalic, no obvious bleeding Lungs: Bilateral crackles up to mid lung CVS:  Regular rhythm, mildly tachycardic, no murmur  GI/Abd soft, nontender, nondistended, bowel sound present CNS: Alert, awake, oriented x 3 Psychiatry: Mood appropriate Extremities: No pedal edema, no calf tenderness   ------------------------------------------------------------------------------------------------------ Assessment/Plan: Principal Problem:   ESRD (end stage renal disease) ([place])  Acute respiratory with hypoxia Acute exacerbation of diastolic CHF Volume overload status ESRD-HD-TTS Patient became short of breath after the ureteral ligation today. Chest x-ray with pulm edema No fever to suggest infectious etiology. Last dialysis was yesterday 2/20 Last echo from August 2023 with EF 60 to 65%, severe LVH Patient still makes urine.  Will give 1 dose of IV Lasix 40 mg Consult nephrology. Initially required 10 L oxygen.  Currently on 3 L.. Wean down as tolerated.  Essential hypertension PTA on carvedilol 25 mg twice daily, amlodipine 10 mg daily, hydralazine 100 mg 3 times daily, Aldactone 50 mg daily, Resume all   History of IVH PTA on aspirin 81 mg daily, Keppra 1000 mg twice daily, Resume Keppra.  Keep aspirin on hold for now.   Mobility: Encourage ambulation  Goals of care   Code Status: Full Code    DVT prophylaxis:  heparin injection 5,000 Units Start: 08/06/22 2200   Antimicrobials: None Fluid: None Consultants: Nephrology, vascular surgery Family Communication: None  Dispo: The patient is from: Home              Anticipated d/c is to: Home hopefully tomorrow after dialysis  Diet: Diet Order             Diet Heart Room service appropriate? Yes; Fluid consistency: Thin  Diet effective now                    ------------------------------------------------------------------------------------- Severity of Illness: The appropriate patient status for this patient is OBSERVATION. Observation status is judged to be reasonable and necessary in  order to provide the required intensity of service to ensure the patient's safety. The patient's presenting symptoms, physical exam findings, and initial radiographic and laboratory data in the context of their medical condition is felt to place them at decreased risk for further clinical deterioration. Furthermore, it is anticipated that the patient will be medically stable for discharge from the hospital within 2 midnights of admission.  -------------------------------------------------------------------------------------  Labs on Admission:   CBC: Recent Labs  Lab 08/06/22 0828  HGB 8.2*  HCT 24.0*    Basic Metabolic Panel: Recent Labs  Lab 08/06/22 0828  NA  137  K 3.6  CL 96*  GLUCOSE 115*  BUN 40*  CREATININE 6.70*    Liver Function Tests: No results for input(s): "AST", "ALT", "ALKPHOS", "BILITOT", "PROT", "ALBUMIN" in the last 168 hours. No results for input(s): "LIPASE", "AMYLASE" in the last 168 hours. No results for input(s): "AMMONIA" in the last 168 hours.  Cardiac Enzymes: No results for input(s): "CKTOTAL", "CKMB", "CKMBINDEX", "TROPONINI" in the last 168 hours.  BNP (last 3 results) Recent Labs    08/17/21 2354 02/02/22 1144  BNP 551.4* 344.6*    ProBNP (last 3 results) No results for input(s): "PROBNP" in the last 8760 hours.  CBG: No results for input(s): "GLUCAP" in the last 168 hours.  Lipase     Component Value Date/Time   LIPASE 87 (H) 03/09/2018 1843     Urinalysis    Component Value Date/Time   COLORURINE YELLOW 02/19/2019 1545   APPEARANCEUR CLOUDY (A) 02/19/2019 1545   LABSPEC 1.011 02/19/2019 1545   PHURINE 9.0 (H) 02/19/2019 1545   GLUCOSEU NEGATIVE 02/19/2019 1545   HGBUR NEGATIVE 02/19/2019 1545   BILIRUBINUR NEGATIVE 02/19/2019 1545   KETONESUR NEGATIVE 02/19/2019 1545   PROTEINUR >=300 (A) 02/19/2019 1545   UROBILINOGEN 0.2 06/17/2016 1257   NITRITE NEGATIVE 02/19/2019 1545   LEUKOCYTESUR NEGATIVE 02/19/2019 1545      Drugs of Abuse     Component Value Date/Time   LABOPIA NONE DETECTED 02/19/2019 1545   COCAINSCRNUR NONE DETECTED 02/19/2019 1545   LABBENZ POSITIVE (A) 02/19/2019 1545   AMPHETMU NONE DETECTED 02/19/2019 1545   THCU POSITIVE (A) 02/19/2019 1545   LABBARB NONE DETECTED 02/19/2019 1545      Radiological Exams on Admission: DG Chest 1 View  Result Date: 08/06/2022 CLINICAL DATA:  Hypoxemia. EXAM: CHEST  1 VIEW COMPARISON:  Chest radiographs 06/07/2022 and chest CT 06/09/2022 FINDINGS: A right jugular catheter terminates over the mid to lower SVC. The cardiac silhouette and pulmonary arteries remain enlarged. Interstitial and basilar predominant hazy and patchy airspace opacities are present bilaterally with indistinctness of the pulmonary vasculature. There may be trace pleural effusions. No pneumothorax is identified. No acute osseous abnormality is seen. IMPRESSION: Cardiomegaly and bilateral lung opacities consistent with edema. Electronically Signed   By: Logan Bores M.D.   On: 08/06/2022 15:41   HYBRID OR IMAGING ([facility] ONLY)  Result Date: 08/06/2022 There is no interpretation for this exam.  This order is for images obtained during a surgical procedure.  Please See "Surgeries" Tab for more information regarding the procedure.     Signed, Terrilee Croak, MD [facility] 08/06/2022

## 2022-08-06 NOTE — Discharge Instructions (Signed)
   [facility]  Discharge Instructions  AV Fistula or Graft Surgery for Dialysis Access  Please refer to the following instructions for your post-procedure care. Your surgeon or physician assistant will discuss any changes with you.  Activity  You may drive the day following your surgery, if you are comfortable and no longer taking prescription pain medication. Resume full activity as the soreness in your incision resolves.  Bathing/Showering  You may shower after you go home. Keep your incision dry for 48 hours. Do not soak in a bathtub, hot tub, or swim until the incision heals completely. You may not shower if you have a hemodialysis catheter.  Incision Care  Clean your incision with mild soap and water after 48 hours. Pat the area dry with a clean towel. You do not need a bandage unless otherwise instructed. Do not apply any ointments or creams to your incision. You may have skin glue on your incision. Do not peel it off. It will come off on its own in about one week. Your arm may swell a bit after surgery. To reduce swelling use pillows to elevate your arm so it is above your heart. Your doctor will tell you if you need to lightly wrap your arm with an ACE bandage.  Diet  Resume your normal diet. There are not special food restrictions following this procedure. In order to heal from your surgery, it is CRITICAL to get adequate nutrition. Your body requires vitamins, minerals, and protein. Vegetables are the best source of vitamins and minerals. Vegetables also provide the perfect balance of protein. Processed food has little nutritional value, so try to avoid this.  Medications  Resume taking all of your medications. If your incision is causing pain, you may take over-the counter pain relievers such as acetaminophen (Tylenol). If you were prescribed a stronger pain medication, please be aware these medications can cause nausea and constipation. Prevent  nausea by taking the medication with a snack or meal. Avoid constipation by drinking plenty of fluids and eating foods with high amount of fiber, such as fruits, vegetables, and grains.  Do not take Tylenol if you are taking prescription pain medications.  Follow up Your surgeon may want to see you in the office following your access surgery. If so, this will be arranged at the time of your surgery.  Please call us immediately for any of the following conditions:  Increased pain, redness, drainage (pus) from your incision site Fever of 101 degrees or higher Severe or worsening pain at your incision site Hand pain or numbness.  Reduce your risk of vascular disease:  Stop smoking. If you would like help, call [facility] at 1-800-QUIT-NOW 217-771-7204) or [place] at [place] your cholesterol Maintain a desired weight Control your diabetes Keep your blood pressure down  Dialysis  It will take several weeks to several months for your new dialysis access to be ready for use. Your surgeon will determine when it is okay to use it. Your nephrologist will continue to direct your dialysis. You can continue to use your Permcath until your new access is ready for use.   08/06/2022 Madeline Brown [id number] [birth year]  Surgeon(s): Cherre Robins, MD  Procedure(s): LEFT UPPER EXTREMITY FISTULA LIGATION INSERTION OF TUNNELED DIALYSIS CATHETER    If you have any questions, please call the office at [phone].

## 2022-08-06 NOTE — Anesthesia Procedure Notes (Signed)
Procedure Name: MAC Date/Time: 08/06/2022 12:45 PM  Performed by: Trinna Post., CRNAPre-anesthesia Checklist: Patient identified, Emergency Drugs available, Suction available, Patient being monitored and Timeout performed Patient Re-evaluated:Patient Re-evaluated prior to induction Oxygen Delivery Method: Simple face mask Preoxygenation: Pre-oxygenation with 100% oxygen Induction Type: IV induction Placement Confirmation: positive ETCO2

## 2022-08-06 NOTE — Transfer of Care (Signed)
Immediate Anesthesia Transfer of Care Note  Patient: Madeline Brown  Procedure(s) Performed: LEFT UPPER EXTREMITY FISTULA LIGATION (Left: Arm Upper) INSERTION OF TUNNELED DIALYSIS CATHETER (Right: Chest)  Patient Location: PACU  Anesthesia Type:MAC  Level of Consciousness: awake, alert , and oriented  Airway & Oxygen Therapy: Patient Spontanous Breathing and Patient connected to face mask oxygen  Post-op Assessment: Report given to RN and Post -op Vital signs reviewed and stable  Post vital signs: Reviewed and stable  Last Vitals:  Vitals Value Taken Time  BP 124/85 08/06/22 1436  Temp    Pulse 76 08/06/22 1439  Resp 21 08/06/22 1440  SpO2 81 % 08/06/22 1439  Vitals shown include unvalidated device data.  Last Pain:  Vitals:   08/06/22 1131  TempSrc: Oral  PainSc:          Complications: No notable events documented.

## 2022-08-06 NOTE — Interval H&P Note (Signed)
History and Physical Interval Note:  08/06/2022 7:58 AM  Madeline Brown  has presented today for surgery, with the diagnosis of ESRD.  The various methods of treatment have been discussed with the patient and family. After consideration of risks, benefits and other options for treatment, the patient has consented to  Procedure(s): LEFT UPPER EXTREMITY FISTULA EXCISION (Left) INSERTION OF TUNNELED DIALYSIS CATHETER (N/A) as a surgical intervention.  The patient's history has been reviewed, patient examined, no change in status, stable for surgery.  I have reviewed the patient's chart and labs.  Questions were answered to the patient's satisfaction.     Cherre Robins

## 2022-08-07 ENCOUNTER — Encounter (HOSPITAL_COMMUNITY): Payer: Self-pay | Admitting: Vascular Surgery

## 2022-08-07 ENCOUNTER — Inpatient Hospital Stay (HOSPITAL_COMMUNITY): Payer: Medicare HMO

## 2022-08-07 DIAGNOSIS — N186 End stage renal disease: Secondary | ICD-10-CM | POA: Diagnosis not present

## 2022-08-07 LAB — CBC
HCT: 20.1 % — ABNORMAL LOW (ref 36.0–46.0)
Hemoglobin: 6.5 g/dL — CL (ref 12.0–15.0)
MCH: 33 pg (ref 26.0–34.0)
MCHC: 32.3 g/dL (ref 30.0–36.0)
MCV: 102 fL — ABNORMAL HIGH (ref 80.0–100.0)
Platelets: 163 10*3/uL (ref 150–400)
RBC: 1.97 MIL/uL — ABNORMAL LOW (ref 3.87–5.11)
RDW: 16.7 % — ABNORMAL HIGH (ref 11.5–15.5)
WBC: 6.6 10*3/uL (ref 4.0–10.5)
nRBC: 0 % (ref 0.0–0.2)

## 2022-08-07 LAB — BASIC METABOLIC PANEL
Anion gap: 18 — ABNORMAL HIGH (ref 5–15)
BUN: 60 mg/dL — ABNORMAL HIGH (ref 6–20)
CO2: 22 mmol/L (ref 22–32)
Calcium: 9.8 mg/dL (ref 8.9–10.3)
Chloride: 94 mmol/L — ABNORMAL LOW (ref 98–111)
Creatinine, Ser: 7.44 mg/dL — ABNORMAL HIGH (ref 0.44–1.00)
GFR, Estimated: 6 mL/min — ABNORMAL LOW (ref >60)
Glucose, Bld: 110 mg/dL — ABNORMAL HIGH (ref 70–99)
Potassium: 4.2 mmol/L (ref 3.5–5.1)
Sodium: 134 mmol/L — ABNORMAL LOW (ref 135–145)

## 2022-08-07 LAB — PREPARE RBC (CROSSMATCH)

## 2022-08-07 MED ORDER — DIPHENHYDRAMINE HCL 25 MG PO CAPS
25.0000 mg | ORAL_CAPSULE | Freq: Two times a day (BID) | ORAL | Status: DC | PRN
  Administered 2022-08-07 – 2022-08-08 (×2): 25 mg via ORAL
  Filled 2022-08-07 (×2): qty 1

## 2022-08-07 MED ORDER — LIDOCAINE-PRILOCAINE 2.5-2.5 % EX CREA: 1.0000 | TOPICAL_CREAM | CUTANEOUS | Status: DC | PRN

## 2022-08-07 MED ORDER — RENA-VITE PO TABS
1.0000 | ORAL_TABLET | Freq: Every day | ORAL | Status: DC
  Administered 2022-08-07: 1 via ORAL
  Filled 2022-08-07: qty 1

## 2022-08-07 MED ORDER — LIDOCAINE HCL (PF) 1 % IJ SOLN: 5.0000 mL | INTRAMUSCULAR | Status: DC | PRN

## 2022-08-07 MED ORDER — HEPARIN SODIUM (PORCINE) 1000 UNIT/ML DIALYSIS: 1000.0000 [IU] | INTRAMUSCULAR | Status: DC | PRN

## 2022-08-07 MED ORDER — ALTEPLASE 2 MG IJ SOLR: 2.0000 mg | Freq: Once | INTRAMUSCULAR | Status: DC | PRN

## 2022-08-07 MED ORDER — CHLORHEXIDINE GLUCONATE CLOTH 2 % EX PADS
6.0000 | MEDICATED_PAD | Freq: Every day | CUTANEOUS | Status: DC
  Administered 2022-08-08: 6 via TOPICAL

## 2022-08-07 MED ORDER — PENTAFLUOROPROP-TETRAFLUOROETH EX AERO: 1.0000 | INHALATION_SPRAY | CUTANEOUS | Status: DC | PRN

## 2022-08-07 MED ORDER — CINACALCET HCL 30 MG PO TABS
30.0000 mg | ORAL_TABLET | Freq: Every day | ORAL | Status: DC
  Filled 2022-08-07: qty 1

## 2022-08-07 MED ORDER — LEVETIRACETAM 500 MG PO TABS
1000.0000 mg | ORAL_TABLET | Freq: Every day | ORAL | Status: DC
  Administered 2022-08-07 – 2022-08-08 (×2): 1000 mg via ORAL
  Filled 2022-08-07 (×3): qty 2

## 2022-08-07 MED ORDER — ALBUTEROL SULFATE (2.5 MG/3ML) 0.083% IN NEBU: 2.5000 mg | INHALATION_SOLUTION | RESPIRATORY_TRACT | Status: DC | PRN

## 2022-08-07 MED ORDER — ANTICOAGULANT SODIUM CITRATE 4% (200MG/5ML) IV SOLN: 5.0000 mL | Status: DC | PRN

## 2022-08-07 MED ORDER — SEVELAMER CARBONATE 800 MG PO TABS
1600.0000 mg | ORAL_TABLET | Freq: Three times a day (TID) | ORAL | Status: DC
  Administered 2022-08-07 – 2022-08-08 (×4): 1600 mg via ORAL
  Filled 2022-08-07 (×4): qty 2

## 2022-08-07 NOTE — Progress Notes (Signed)
Received patient in bed to unit.  Alert and oriented.  Informed consent signed and in chart.   TX duration: 3.75hrs  Patient tolerated well.  Transported back to the room  Alert, without acute distress.  Hand-off given to patient's nurse.  2nd of PRBC given during tx.  Access used: R IJ Access issues: None  Total UF removed: 2243m Medication(s) given: None  Post HD weight: 49.2kg   08/07/22 1145  Vitals  Temp 98.2 F (36.8 C)  Temp Source Oral  BP (!) 121/91  MAP (mmHg) 101  BP Location Right Arm  BP Method Automatic  Patient Position (if appropriate) Lying  Pulse Rate 68  ECG Heart Rate 69  Resp 19  Oxygen Therapy  SpO2 99 %  O2 Device HFNC  O2 Flow Rate (L/min) 4 L/min  During Treatment Monitoring  Intra-Hemodialysis Comments Tx completed;Tolerated well  Post Treatment  Dialyzer Clearance Lightly streaked  Duration of HD Treatment -hour(s) 3.75 hour(s)  Liters Processed 90  Fluid Removed (mL) 2200 mL  Tolerated HD Treatment Yes  Hemodialysis Catheter Right Subclavian Double lumen Permanent (Tunneled)  Placement Date/Time: 08/06/22 1328   Placed prior to admission: No  Serial / Lot #: [id number] Expiration Date: 12/30/26  Time Out: Correct patient;Correct site;Correct procedure  Maximum sterile barrier precautions: Hand hygiene;Cap;Mask;Sterile...  Site Condition No complications  Blue Lumen Status Dead end cap in place;Heparin locked  Red Lumen Status Dead end cap in place;Heparin locked  Purple Lumen Status N/A  Catheter fill solution Heparin 1000 units/ml  Catheter fill volume (Arterial) 1.6 cc  Catheter fill volume (Venous) 1.6  Dressing Type Transparent  Dressing Status Antimicrobial disc in place;Clean, Dry, Intact  Interventions New dressing  Drainage Description None  Dressing Change Due 08/14/22  Post treatment catheter status Capped and Clamped     [facility]

## 2022-08-07 NOTE — Consult Note (Signed)
Renal Service Consult Note [facility]  Cybil Elmer 08/07/2022 Sol Blazing, MD Requesting Physician: Dr. Pietro Cassis  Reason for Consult: ESRD pt s/p access surgery, needs HD HPI: The patient is a 47 y.o. 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 VVS did the surgery yesterday and also placed RIJ TDC. We are asked to see for dialysis.   Pt seen in HD. No SOB at this time, 1/2 way through HD. No leg swelling.     ROS - denies CP, no joint pain, no HA, no blurry vision, no rash, no diarrhea, no nausea/ vomiting, no dysuria, no difficulty voiding   Past Medical History  Past Medical History:  Diagnosis Date   Anemia    ESRD on HD    T-TH-S dialysis   Headache, unspecified 09/26/2017   History of blood transfusion 07/2020   Hypertension    Intracranial hemorrhage (HCC)    LVH (left ventricular hypertrophy)    Pericardial effusion    Pneumonia    Pulmonary hypertension ([place])    Seizure ([place]) 02/2019   last one was in 02/2019   Shortness of breath 07/15/2018   on 3L via [place] prn   Stroke ([place])    Mild was told after having a seizure   Substance abuse ([place])    Marijuana   Thrombocytopenia ([place])    Past Surgical History  Past Surgical History:  Procedure Laterality Date   AV FISTULA PLACEMENT Left 06/24/2017   Procedure: CREATION OF LEFT ARM BRACHIALCEPHALIC  ARTERIOVENOUS (AV) FISTULA;  Surgeon: Rosetta Posner, MD;  Location: [place];  Service: Vascular;  Laterality: Left;   CESAREAN SECTION     x2   COMPLEX WOUND CLOSURE Left 07/30/2020   Procedure: COMPLEX WOUND CLOSURE;  Surgeon: Cherre Robins, MD;  Location: [place];  Service: Vascular;  Laterality: Left;   FISTULA SUPERFICIALIZATION Left 10/28/2017   Procedure: SUPERFICIALIZATION LEFT BRACHIOCEPHALIC ARTERIOVENOUS FISTULA;  Surgeon: Rosetta Posner, MD;  Location: [place];  Service: Vascular;  Laterality:  Left;   INGUINAL HERNIA REPAIR Bilateral 02/01/2021   Procedure: LAPAROSCOPIC BILATERAL INGUINAL HERNIA REPAIR WITH MESH;  Surgeon: Kinsinger, Arta Bruce, MD;  Location: [facility];  Service: General;  Laterality: Bilateral;   INSERTION OF DIALYSIS CATHETER Right 06/24/2017   Procedure: INSERTION OF TUNNELED DIALYSIS CATHETER;  Surgeon: Rosetta Posner, MD;  Location: [place];  Service: Vascular;  Laterality: Right;   INSERTION OF DIALYSIS CATHETER Right 07/30/2020   Procedure: INSERTION OF DIALYSIS CATHETER;  Surgeon: Cherre Robins, MD;  Location: [place];  Service: Vascular;  Laterality: Right;   INSERTION OF DIALYSIS CATHETER Right 08/06/2022   Procedure: INSERTION OF TUNNELED DIALYSIS CATHETER;  Surgeon: Cherre Robins, MD;  Location: [place];  Service: Vascular;  Laterality: Right;   LIGATION OF COMPETING BRANCHES OF ARTERIOVENOUS FISTULA  10/28/2017   Procedure: LIGATION OF COMPETING BRANCHES OF ARTERIOVENOUS FISTULA x3;  Surgeon: Rosetta Posner, MD;  Location: [place];  Service: Vascular;;   REVISON OF ARTERIOVENOUS FISTULA Left 07/30/2020   Procedure: LEFT UPPER EXTREMITY ARTERIOVENOUS FISTULA REVISON;  Surgeon: Cherre Robins, MD;  Location: [place];  Service: Vascular;  Laterality: Left;  PERIPHERAL NERVE BLOCK   REVISON OF ARTERIOVENOUS FISTULA Left 08/06/2022   Procedure: LEFT UPPER EXTREMITY FISTULA LIGATION;  Surgeon: Cherre Robins, MD;  Location: [place];  Service: Vascular;  Laterality: Left;   RIGHT HEART CATH  N/A 07/09/2022   Procedure: RIGHT HEART CATH;  Surgeon: Hebert Soho, DO;  Location: [place] [facility];  Service: Cardiovascular;  Laterality: N/A;   Family History  Family History  Problem Relation Age of Onset   Diabetes Father    Hypertension Father    Cancer Father    Heart disease Father    Aneurysm Mother    Seizures Mother    Social History  reports that she has been smoking cigarettes. She started smoking about 28 years ago. She has a 13.50 pack-year smoking history.  She has been exposed to tobacco smoke. She has never used smokeless tobacco. She reports that she does not currently use alcohol. She reports current drug use. Drug: Marijuana. Allergies  Allergies  Allergen Reactions   Visine [Tetrahydrozoline Hcl] Swelling and Other (See Comments)    Eyes Swell   Home medications Prior to Admission medications   Medication Sig Start Date End Date Taking? Authorizing Provider  acetaminophen (TYLENOL) 500 MG tablet Take 500-1,000 mg by mouth every 6 (six) hours as needed for mild pain or headache.   Yes [provider]  amLODipine (NORVASC) 10 MG tablet Take 1 tablet (10 mg total) by mouth at bedtime. 12/09/19 06/08/23 Yes Aslam, Loralyn Freshwater, MD  aspirin EC 81 MG tablet Take 81 mg by mouth daily as needed (chest pain). Swallow whole.   Yes [provider]  B Complex-C-Folic Acid (DIALYVITE TABLET) TABS Take 1 tablet by mouth daily. 07/29/22  Yes [provider]  cinacalcet (SENSIPAR) 30 MG tablet Take 30 mg by mouth daily with breakfast. 02/06/22  Yes [provider]  diphenhydrAMINE HCl (BENADRYL PO) Take 2 tablets by mouth Every Tuesday,Thursday,and Saturday with dialysis. As needed   Yes [provider]  hydrALAZINE (APRESOLINE) 100 MG tablet Take 1 tablet (100 mg total) by mouth 3 (three) times daily. 12/09/19 01/30/23 Yes Aslam, Loralyn Freshwater, MD  levETIRAcetam (KEPPRA) 1000 MG tablet Take 1 tablet (1,000 mg total) by mouth 2 (two) times daily. Patient taking differently: Take 1,000 mg by mouth in the morning. 12/07/20  Yes Penumalli, Earlean Polka, MD  oxyCODONE-acetaminophen (PERCOCET) 5-325 MG tablet Take 1 tablet by mouth every 6 (six) hours as needed for severe pain. 08/06/22  Yes Rhyne, Samantha J, PA-C  OXYGEN Inhale 3 L into the lungs continuous.   Yes [provider]  sevelamer carbonate (RENVELA) 800 MG tablet Take 2 tablets (1,600 mg total) by mouth 3 (three) times daily with meals. Patient taking differently: Take  1,600 mg by mouth See admin instructions. Take 1,600 mg by mouth three times a day with meals and 800 mg with each snack 02/21/19  Yes Lyndal Pulley, MD  spironolactone (ALDACTONE) 50 MG tablet Take 1 tablet (50 mg total) by mouth daily. 12/09/19  Yes Aslam, Loralyn Freshwater, MD  carvedilol (COREG) 25 MG tablet Take 1 tablet (25 mg total) by mouth 2 (two) times daily with a meal. Take 25 mg by mouth at 6:30 AM and 6:00 PM on Sun/Mon/Wed/Fri and 25 mg at 11:00 AM and 8:00 PM on Tues/Thurs/Sat 08/06/22 09/05/22  Gabriel Earing, PA-C     Vitals:   08/07/22 0752 08/07/22 0807 08/07/22 0819 08/07/22 0834  BP: 115/83 111/86 111/86 108/84  Pulse: 65 71 77 86  Resp: 10 14 18 $ (!) 21  Temp:  98.2 F (36.8 C) 98.3 F (36.8 C) 98.2 F (36.8 C)  TempSrc:  Oral Oral Oral  SpO2: 94% 91% 91% 92%  Weight:  Height:       Exam Gen alert, no distress, BP 115/80  HR 78  RR 16  afeb No rash, cyanosis or gangrene Sclera anicteric, throat clear  No jvd or bruits Chest clear bilat to bases, no rales/ wheezing RRR no MRG Abd soft ntnd no mass or ascites +bs GU defer MS no joint effusions or deformity Ext no LE or UE edema, no wounds or ulcers Neuro is alert, Ox 3 , nf    RIJ TDC in place      Home meds include - aspirin, sensipar 30 am, renvela 2 ac tid, norvasc 10, hydralazine 100 tid, keppra, aldactone 50 qd, coreg 25 bid     [facility]: [facility] 3h 46mn   350/1.5   49kg  2/2 bath  AVF  Hep none  - last HD 2/20, post wt 48.2kg - hectorol 2 mcg IV tiw - mircera 225 mcg IV q 2wks, last 2/15, due on 2/29    Assessment/ Plan: S/P ligation L arm AVF - due to 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 Surgery was done yest 2/21 ESRD - on HD TTS. HD today. Using new TDC. May need extra HD tomorrow if still showing signs of hypoxia/ vol overload post HD today. AHRF - w/ pulm edema by CXR yesterday. Will repeat CXR post HD today because some of this could be CHF  related and not just volume overload. Get ambulatory sats post-HD as well.  HTN/ volume - BP's wnl, only 1.5 kg up today, but given her CXR changes yesterday will need to lower vol further and lower dry wt. Have d/w pt at length.  Anemia esrd - Hb 8.2 --> 6.5 this am, will consider transfusing.  MBD ckd - Ca in range. Cont meds.  Seizures - on keppra      Rob SJonnie Finner MD [facility] 08/07/2022, 8:41 AM  Recent Labs  Lab 08/06/22 0828 08/07/22 0131  HGB 8.2* 6.5*  CALCIUM  --  9.8  CREATININE 6.70* 7.44*  K 3.6 4.2   Inpatient medications:  acetaminophen  650 mg Oral Q6H   albuterol  2.5 mg Nebulization Q6H   amLODipine  10 mg Oral QHS   carvedilol  25 mg Oral BID WC   Chlorhexidine Gluconate Cloth  6 each Topical Q0600   heparin  5,000 Units Subcutaneous Q8H   hydrALAZINE  100 mg Oral TID   levETIRAcetam  1,000 mg Oral BID   spironolactone  50 mg Oral Daily    anticoagulant sodium citrate     alteplase, anticoagulant sodium citrate, heparin, hydrALAZINE, HYDROmorphone (DILAUDID) injection, lidocaine (PF), lidocaine-prilocaine, oxyCODONE, pentafluoroprop-tetrafluoroeth, senna-docusate

## 2022-08-07 NOTE — Procedures (Addendum)
   I was present at this dialysis session, have reviewed the session itself and made  appropriate changes Kelly Splinter MD [place] pager [phone]   08/07/2022, 1:12 PM

## 2022-08-07 NOTE — Progress Notes (Signed)
Paged Dr. Luan Pulling Patient HBG 6.5 ,HCT 20.1. March Rummage Nurse aware. Call returned at 02:34. M.D. will put new orders in.

## 2022-08-07 NOTE — Progress Notes (Signed)
Pt receives out-pt HD at [place] on TTS. Will assist as needed.   Melven Sartorius Renal Navigator [phone]

## 2022-08-07 NOTE — Progress Notes (Addendum)
[facility]   Subjective  - Left UE dressing in place, no new complaints   Objective 114/83 85 98 F (36.7 C) (Oral) 16 98%  Intake/Output Summary (Last 24 hours) at 08/07/2022 0736 Last data filed at 08/07/2022 0500 Gross per 24 hour  Intake 960 ml  Output 25 ml  Net 935 ml    Left UE dressing in place Sensation to light touch intact, grip 5/5, palpable radial pulse Lungs non labored breathing on O2 3.5 L [place]   Assessment/Planning: POD # ligation of fistula secondary to hight demand CHF She has a right TDC and HD TTS She was admitted secondary to increased O2 demand with RA SAT of 80-83.    You may need 24 O2 support? F/U with DR. Ezell Poke in 4 weeks The dressing may be removed 48 hours post op.  Increase activity as tolerates with the right UE.  Roxy Horseman 08/07/2022 7:36 AM --  Laboratory Lab Results: Recent Labs    08/06/22 0828 08/07/22 0131  WBC  --  6.6  HGB 8.2* 6.5*  HCT 24.0* 20.1*  PLT  --  163   BMET Recent Labs    08/06/22 0828 08/07/22 0131  NA 137 134*  K 3.6 4.2  CL 96* 94*  CO2  --  22  GLUCOSE 115* 110*  BUN 40* 60*  CREATININE 6.70* 7.44*  CALCIUM  --  9.8    COAG Lab Results  Component Value Date   INR 1.14 06/24/2017   INR 1.07 06/19/2017   INR 1.07 02/13/2017   No results found for: "PTT"  VASCULAR STAFF ADDENDUM: I have independently interviewed and examined the patient. I agree with the above.   Yevonne Aline. Stanford Breed, MD [facility] Phone Number: [phone] 08/08/2022 7:36 AM

## 2022-08-07 NOTE — Progress Notes (Signed)
PROGRESS NOTE  Madeline Brown  DOB: November 18, 1975  PCP: Sonia Side., FNP [id number]  DOA: 08/06/2022  LOS: 1 day  Hospital Day: 2  Brief narrative: Madeline Brown is a 47 y.o. female with PMH significant for ESRD-HD-TTS, diastolic CHF, HTN, pulmonary hypertension, history of IVH. 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  She underwent extensive excision and plication of the fistula and placement of right IJ [facility] in February 2022.  Right IJ TDC was also removed in June 2022. Lately, patient was having similar issues with her fistula again.  She had severe aneurysmal dilation of the fistula leading to high-output CHF.  She was recently hospitalized 12/23-12/26 for shortness of breath, hypoxia.  Chest x-ray showed pulm edema which improved with dialysis.  2/21, patient presented for elective ligation of the left arm AV fistula..  She also had right IJ tunneled dialysis catheter placed. Postprocedure in the recovery, patient was short of and required up to 10 L of oxygen.  It was gradually weaned down to 3 L/min. Blood gas was obtained which showed pH of 7.39, pCO2 51, pO2 57, bicarb 31 Chest x-ray was obtained which showed cardiomegaly and bilateral lung opacities consistent with edema. Last dialysis was the day before 2/20. Admitted to [facility]   Subjective: Patient was seen and examined seen and examined this morning at dialysis unit.  On 3 L oxygen.  Feels better.  Lung auscultation is better. Hemoglobin this morning was low at 6.5.  2 units of PRBC transfusion given at dialysis.  Assessment/Plan: Acute respiratory with hypoxia Acute exacerbation of diastolic CHF Volume overload status ESRD-HD-TTS Patient became short of breath after the AV fistula ligation on 2/21. Chest x-ray showed pulm edema.  1 dose of IV Lasix was given. No fever to suggest infectious etiology. Last echo from August  2023 with EF 60 to 65%, severe LVH Nephrology consulted.  Underwent dialysis this morning. Noted a plan from nephrology to repeat chest x-ray and obtain ambulatory oxygen requirement. Initially required 10 L oxygen.  Currently on 3 L.. Wean down as tolerated.  Acute blood loss anemia  Anemia of chronic disease Baseline hemoglobin of 11 from January.  Hemoglobin dropped down to 6.5 this morning probably due to surgical blood loss yesterday. 2 units of PRBC transfused this morning.  Continue to monitor. Recent Labs    07/04/22 1001 07/09/22 0959 07/09/22 1020 07/09/22 1023 07/09/22 1025 08/06/22 0828 08/07/22 0131  HGB 9.2*   < > 9.2* 8.8* 11.6* 8.2* 6.5*  MCV 95  --   --   --   --   --  102.0*   < > = values in this interval not displayed.   Essential hypertension PTA on carvedilol 25 mg twice daily, amlodipine 10 mg daily, hydralazine 100 mg 3 times daily, Aldactone 50 mg daily, Continue all.  History of IVH PTA on aspirin 81 mg daily, Keppra 1000 mg daily, Continue Keppra.  Aspirin on hold.  Mobility: Encourage ambulation.  Obtain ambulatory oxygen requirement  Goals of care   Code Status: Full Code    DVT prophylaxis:  heparin injection 5,000 Units Start: 08/06/22 2200   Antimicrobials: None Fluid: None Consultants: Nephrology, vascular surgery Family Communication: None  Dispo: The patient is from: Home              Anticipated d/c is to: Discussed with nephrology.  Home hopefully tomorrow if chest x-ray shows persistent improvement.  Diet: Diet Order             Diet renal with fluid restriction Fluid restriction: 1200 mL Fluid; Room service appropriate? Yes; Fluid consistency: Thin  Diet effective now                  Scheduled Meds:  acetaminophen  650 mg Oral Q6H   albuterol  2.5 mg Nebulization Q6H   amLODipine  10 mg Oral QHS   carvedilol  25 mg Oral BID WC   Chlorhexidine Gluconate Cloth  6 each Topical Q0600   heparin  5,000 Units  Subcutaneous Q8H   hydrALAZINE  100 mg Oral TID   levETIRAcetam  1,000 mg Oral Daily   spironolactone  50 mg Oral Daily    PRN meds: alteplase, anticoagulant sodium citrate, heparin, hydrALAZINE, HYDROmorphone (DILAUDID) injection, lidocaine (PF), lidocaine-prilocaine, oxyCODONE, pentafluoroprop-tetrafluoroeth, senna-docusate   Infusions:   anticoagulant sodium citrate      Diet:  Diet Order             Diet renal with fluid restriction Fluid restriction: 1200 mL Fluid; Room service appropriate? Yes; Fluid consistency: Thin  Diet effective now                   Antimicrobials: Anti-infectives (From admission, onward)    Start     Dose/Rate Route Frequency Ordered Stop   08/06/22 0743  ceFAZolin (ANCEF) IVPB 2g/100 mL premix        2 g 200 mL/hr over 30 Minutes Intravenous 30 min pre-op 08/06/22 0743 08/06/22 1327       Skin assessment:       Nutritional status:  Body mass index is 21.92 kg/m.          Objective: Vitals:   08/07/22 1022 08/07/22 1052  BP: (!) 113/91 115/89  Pulse: 79 76  Resp: 18 15  Temp:    SpO2: 93% 93%    Intake/Output Summary (Last 24 hours) at 08/07/2022 1126 Last data filed at 08/07/2022 [id number] Gross per 24 hour  Intake 1590 ml  Output 25 ml  Net 1565 ml   Filed Weights   08/07/22 0500 08/07/22 0655 08/07/22 0743  Weight: 50.9 kg 50.9 kg 50.9 kg   Weight change: -0.4 kg Body mass index is 21.92 kg/m.   Physical Exam:  General exam: Pleasant, middle-aged African-American female.  Not in physical distress Skin: No rashes, lesions or ulcers. HEENT: Atraumatic, normocephalic, no obvious bleeding Lungs: Improvement in bilateral crackles. CVS: Regular rhythm, mildly tachycardic, no murmur  GI/Abd soft, nontender, nondistended, bowel sound present CNS: Alert, awake, oriented x 3 Psychiatry: Mood appropriate Extremities: No pedal edema, no calf tenderness  Data Review: I have personally reviewed the laboratory data and  studies available.  F/u labs  Unresulted Labs (From admission, onward)     Start     Ordered   08/08/22 [id number]  Basic metabolic panel  Tomorrow morning,   R       Question:  Specimen collection method  Answer:  Lab=Lab collect   08/07/22 1126   08/08/22 0500  CBC with Differential/Platelet  Tomorrow morning,   R       Question:  Specimen collection method  Answer:  Lab=Lab collect   08/07/22 1126   08/06/22 1826  Hepatitis B surface antibody,quantitative  (New Admission Hemo Labs (Hepatitis B))  Once,   R        08/06/22 1826   Signed and Held  Renal function panel  Once,   R        Signed and Held   Signed and Held  CBC  Once,   R        Signed and Held            Total time spent in review of labs and imaging, patient evaluation, formulation of plan, documentation and communication with family: 45 minutes  Signed, Terrilee Croak, MD [facility] 08/07/2022

## 2022-08-08 ENCOUNTER — Encounter (HOSPITAL_COMMUNITY): Payer: Self-pay | Admitting: Vascular Surgery

## 2022-08-08 DIAGNOSIS — N186 End stage renal disease: Secondary | ICD-10-CM | POA: Diagnosis not present

## 2022-08-08 LAB — CBC WITH DIFFERENTIAL/PLATELET
Abs Immature Granulocytes: 0.03 10*3/uL (ref 0.00–0.07)
Basophils Absolute: 0 10*3/uL (ref 0.0–0.1)
Basophils Relative: 0 %
Eosinophils Absolute: 0.2 10*3/uL (ref 0.0–0.5)
Eosinophils Relative: 3 %
HCT: 25.7 % — ABNORMAL LOW (ref 36.0–46.0)
Hemoglobin: 8.5 g/dL — ABNORMAL LOW (ref 12.0–15.0)
Immature Granulocytes: 0 %
Lymphocytes Relative: 10 %
Lymphs Abs: 0.7 10*3/uL (ref 0.7–4.0)
MCH: 32.2 pg (ref 26.0–34.0)
MCHC: 33.1 g/dL (ref 30.0–36.0)
MCV: 97.3 fL (ref 80.0–100.0)
Monocytes Absolute: 0.4 10*3/uL (ref 0.1–1.0)
Monocytes Relative: 5 %
Neutro Abs: 5.4 10*3/uL (ref 1.7–7.7)
Neutrophils Relative %: 82 %
Platelets: 158 10*3/uL (ref 150–400)
RBC: 2.64 MIL/uL — ABNORMAL LOW (ref 3.87–5.11)
RDW: 18.7 % — ABNORMAL HIGH (ref 11.5–15.5)
WBC: 6.7 10*3/uL (ref 4.0–10.5)
nRBC: 0.4 % — ABNORMAL HIGH (ref 0.0–0.2)

## 2022-08-08 LAB — TYPE AND SCREEN
ABO/RH(D): B POS
Antibody Screen: NEGATIVE
Unit division: 0
Unit division: 0

## 2022-08-08 LAB — BPAM RBC
Blood Product Expiration Date: 202403092359
Blood Product Expiration Date: 202403242359
ISSUE DATE / TIME: 202402220525
ISSUE DATE / TIME: 202402220814
Unit Type and Rh: 7300
Unit Type and Rh: 7300

## 2022-08-08 LAB — BASIC METABOLIC PANEL
Anion gap: 13 (ref 5–15)
BUN: 43 mg/dL — ABNORMAL HIGH (ref 6–20)
CO2: 29 mmol/L (ref 22–32)
Calcium: 9.8 mg/dL (ref 8.9–10.3)
Chloride: 94 mmol/L — ABNORMAL LOW (ref 98–111)
Creatinine, Ser: 5.18 mg/dL — ABNORMAL HIGH (ref 0.44–1.00)
GFR, Estimated: 10 mL/min — ABNORMAL LOW (ref >60)
Glucose, Bld: 91 mg/dL (ref 70–99)
Potassium: 3.8 mmol/L (ref 3.5–5.1)
Sodium: 136 mmol/L (ref 135–145)

## 2022-08-08 MED ORDER — HEPARIN SODIUM (PORCINE) 1000 UNIT/ML IJ SOLN
INTRAMUSCULAR | Status: AC
  Filled 2022-08-08: qty 4

## 2022-08-08 NOTE — Discharge Summary (Signed)
Physician Discharge Summary  Madeline Brown [id number] DOB: 04/24/1976 DOA: 08/06/2022  PCP: Sonia Side., FNP  Admit date: 08/06/2022 Discharge date: 08/08/2022  Admitted From: Home Discharge disposition: Home  Brief narrative: Madeline Brown is a 47 y.o. female with PMH significant for ESRD-HD-TTS, diastolic CHF, HTN, pulmonary hypertension, history of IVH. 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  She underwent extensive excision and plication of the fistula and placement of right IJ [facility] in February 2022.  Right IJ TDC was also removed in June 2022. Lately, patient was having similar issues with her fistula again.  She had severe aneurysmal dilation of the fistula leading to high-output CHF.  She was recently hospitalized 12/23-12/26 for shortness of breath, hypoxia.  Chest x-ray showed pulm edema which improved with dialysis.  2/21, patient presented for elective ligation of the left arm AV fistula..  She also had right IJ tunneled dialysis catheter placed. Postprocedure in the recovery, patient was short of and required up to 10 L of oxygen.  It was gradually weaned down to 3 L/min. Blood gas was obtained which showed pH of 7.39, pCO2 51, pO2 57, bicarb 31 Chest x-ray was obtained which showed cardiomegaly and bilateral lung opacities consistent with edema. Last dialysis was the day before 2/20. Admitted to [facility].  Subjective: Patient was seen and examined seen and examined this morning at dialysis unit.  On 3 L oxygen.  Lung auscultation better.  She is able to ambulate in the hallway on 4 L oxygen by nasal cannula.  Hospital course: Acute respiratory with hypoxia Acute exacerbation of diastolic CHF Volume overload status ESRD-HD-TTS Patient became short of breath after the AV fistula ligation on 2/21. Chest x-ray showed pulm edema.  1 dose of IV Lasix was given. No fever to  suggest infectious etiology. Last echo from August 2023 with EF 60 to 65%, severe LVH Nephrology consulted.  Underwent dialysis yesterday and today. Noted a plan from nephrology to repeat chest x-ray and obtain ambulatory oxygen requirement. Initially required 10 L oxygen.  Currently on 3 L.. Wean down as tolerated.  Acute blood loss anemia  Anemia of chronic disease Baseline hemoglobin of 11 from January.  Hemoglobin dropped down to the lowest of 6.5 on 2/22.  Probably due to blood loss during surgery.  Improved with transfusion. Recent Labs    07/09/22 1023 07/09/22 1025 08/06/22 0828 08/07/22 0131 08/08/22 0153  HGB 8.8* 11.6* 8.2* 6.5* 8.5*  MCV  --   --   --  102.0* 97.3   Essential hypertension PTA on carvedilol 25 mg twice daily, amlodipine 10 mg daily, hydralazine 100 mg 3 times daily, Aldactone 50 mg daily, Continue all.  History of IVH PTA on aspirin 81 mg daily, Keppra 1000 mg daily, Continue Keppra.  Aspirin to resume post discharge.  Wounds:  - Incision - 3 Ports Abdomen Right Umbilicus Left (Active)  Placement Date/Time: 02/01/21 1039   Location of Ports: Abdomen  Location Orientation: Right  Location Orientation: Umbilicus  Location Orientation: Left    Assessments 02/01/2021 10:39 AM 02/01/2021  1:30 PM  Port 1 Drainage Amount -- None  Port 1 Dressing Type Liquid skin adhesive Liquid skin adhesive  Port 2 Drainage Amount -- None  Port 2 Dressing Type Liquid skin adhesive Liquid skin adhesive  Port 3 Drainage Amount -- None  Port 3 Dressing Type Liquid skin adhesive Liquid skin adhesive     No associated  orders.     Incision (Closed) 08/06/22 Arm Left (Active)  Date First Assessed/Time First Assessed: 08/06/22 1420   Location: Arm  Location Orientation: Left    Assessments 08/06/2022  2:36 PM 08/08/2022  8:00 AM  Dressing Type Compression wrap Compression wrap  Dressing Clean, Dry, Intact Clean, Dry, Intact  Site / Wound Assessment Dressing in place /  Unable to assess Dressing in place / Unable to assess  Drainage Amount None None     No associated orders.     Incision (Closed) 08/06/22 Neck Right (Active)  Date First Assessed/Time First Assessed: 08/06/22 1420   Location: Neck  Location Orientation: Right    Assessments 08/06/2022  2:36 PM 08/08/2022  8:00 AM  Dressing Type Liquid skin adhesive Liquid skin adhesive  Dressing Clean, Dry, Intact Clean, Dry, Intact  Site / Wound Assessment Clean;Dry Clean;Dry  Margins Attached edges (approximated) Attached edges (approximated)  Closure Skin glue Approximated;Skin glue  Drainage Amount -- None  Treatment -- Other (Comment)     No associated orders.    Discharge Exam:   Vitals:   08/08/22 1054 08/08/22 1124 08/08/22 1154 08/08/22 1201  BP: 105/86 100/85 107/84 (!) 106/93  Pulse: 88 81 73 82  Resp: 18 18 (!) 21 (!) 21  Temp:    98 F (36.7 C)  TempSrc:    Oral  SpO2: 96% 97% 98% 99%  Weight:      Height:        Body mass index is 21.7 kg/m.   General exam: Pleasant, middle-aged African-American female.  Not in physical distress Skin: No rashes, lesions or ulcers. HEENT: Atraumatic, normocephalic, no obvious bleeding Lungs: Improvement in bilateral crackles. CVS: Regular rhythm, mildly tachycardic, no murmur  GI/Abd soft, nontender, nondistended, bowel sound present CNS: Alert, awake, oriented x 3 Psychiatry: Mood appropriate Extremities: No pedal edema, no calf tenderness  Follow ups:    Follow-up Information     Cherre Robins, MD Follow up in 4 week(s).   Specialties: Vascular Surgery, Interventional Cardiology Why: Office will call you to arrange your appt (sent). Contact information: [address] [place] [address] [phone]         Sonia Side., FNP Follow up.   Specialty: Family Medicine Contact information: [place] [address] [phone]         Sonia Side., FNP Follow up.   Specialty: Family  Medicine Contact information: [place] [address] [phone]                 Discharge Instructions:   Discharge Instructions     Call MD for:  difficulty breathing, headache or visual disturbances   Complete by: As directed    Call MD for:  extreme fatigue   Complete by: As directed    Call MD for:  hives   Complete by: As directed    Call MD for:  persistant dizziness or light-headedness   Complete by: As directed    Call MD for:  persistant nausea and vomiting   Complete by: As directed    Call MD for:  severe uncontrolled pain   Complete by: As directed    Call MD for:  temperature >100.4   Complete by: As directed    Diet - low sodium heart healthy   Complete by: As directed    Discharge instructions   Complete by: As directed    General discharge instructions: Follow with Primary MD Tamala Julian,  Malva Limes., FNP in 7 days  Please request your PCP  to go over your hospital tests, procedures, radiology results at the follow up. Please get your medicines reviewed and adjusted.  Your PCP may decide to repeat certain labs or tests as needed. Do not drive, operate heavy machinery, perform activities at heights, swimming or participation in water activities or provide baby sitting services if your were admitted for syncope or siezures until you have seen by Primary MD or a Neurologist and advised to do so again. [place] Controlled Substance Reporting System database was reviewed. Do not drive, operate heavy machinery, perform activities at heights, swim, participate in water activities or provide baby-sitting services while on medications for pain, sleep and mood until your outpatient physician has reevaluated you and advised to do so again.  You are strongly recommended to comply with the dose, frequency and duration of prescribed medications. Activity: As tolerated with Full fall precautions use walker/cane & assistance as needed Avoid using any  recreational substances like cigarette, tobacco, alcohol, or non-prescribed drug. If you experience worsening of your admission symptoms, develop shortness of breath, life threatening emergency, suicidal or homicidal thoughts you must seek medical attention immediately by calling 911 or calling your MD immediately  if symptoms less severe. You must read complete instructions/literature along with all the possible adverse reactions/side effects for all the medicines you take and that have been prescribed to you. Take any new medicine only after you have completely understood and accepted all the possible adverse reactions/side effects.  Wear Seat belts while driving. You were cared for by a hospitalist during your hospital stay. If you have any questions about your discharge medications or the care you received while you were in the hospital after you are discharged, you can call the unit and ask to speak with the hospitalist or the covering physician. Once you are discharged, your primary care physician will handle any further medical issues. Please note that NO REFILLS for any discharge medications will be authorized once you are discharged, as it is imperative that you return to your primary care physician (or establish a relationship with a primary care physician if you do not have one).   Increase activity slowly   Complete by: As directed        Discharge Medications:   Allergies as of 08/08/2022       Reactions   Visine [tetrahydrozoline Hcl] Swelling, Other (See Comments)   Eyes Swell        Medication List     TAKE these medications    acetaminophen 500 MG tablet Commonly known as: TYLENOL Take 500-1,000 mg by mouth every 6 (six) hours as needed for mild pain or headache.   amLODipine 10 MG tablet Commonly known as: NORVASC Take 1 tablet (10 mg total) by mouth at bedtime.   aspirin EC 81 MG tablet Take 81 mg by mouth daily as needed (chest pain). Swallow whole.   BENADRYL  PO Take 2 tablets by mouth Every Tuesday,Thursday,and Saturday with dialysis. As needed   carvedilol 25 MG tablet Commonly known as: COREG Take 1 tablet (25 mg total) by mouth 2 (two) times daily with a meal. Take 25 mg by mouth at 6:30 AM and 6:00 PM on Sun/Mon/Wed/Fri and 25 mg at 11:00 AM and 8:00 PM on Tues/Thurs/Sat   DIALYVITE TABLET Tabs Take 1 tablet by mouth daily.   hydrALAZINE 100 MG tablet Commonly known as: APRESOLINE Take 1 tablet (100 mg total) by  mouth 3 (three) times daily.   levETIRAcetam 1000 MG tablet Commonly known as: KEPPRA Take 1,000 mg by mouth daily. What changed: Another medication with the same name was removed. Continue taking this medication, and follow the directions you see here.   oxyCODONE-acetaminophen 5-325 MG tablet Commonly known as: Percocet Take 1 tablet by mouth every 6 (six) hours as needed for severe pain.   OXYGEN Inhale 3 L into the lungs continuous.   Sensipar 30 MG tablet Generic drug: cinacalcet Take 30 mg by mouth daily with breakfast.   sevelamer carbonate 800 MG tablet Commonly known as: RENVELA Take 2 tablets (1,600 mg total) by mouth 3 (three) times daily with meals. What changed:  when to take this additional instructions   spironolactone 50 MG tablet Commonly known as: ALDACTONE Take 1 tablet (50 mg total) by mouth daily.         The results of significant diagnostics from this hospitalization (including imaging, microbiology, ancillary and laboratory) are listed below for reference.    Procedures and Diagnostic Studies:   DG CHEST PORT 1 VIEW  Result Date: 08/07/2022 CLINICAL DATA:  Pulmonary edema, follow-up EXAM: PORTABLE CHEST 1 VIEW COMPARISON:  Portable exam 1233 hours compared to 08/06/2022 FINDINGS: RIGHT jugular dual-lumen central venous catheter tip projects over SVC. Enlargement of cardiac silhouette with pulmonary vascular congestion. Diffuse infiltrates consistent with pulmonary edema, slightly  improved. No pleural effusion or pneumothorax. Osseous structures unremarkable. IMPRESSION: Slightly improved pulmonary edema. Electronically Signed   By: Lavonia Dana M.D.   On: 08/07/2022 14:26   DG Chest 1 View  Result Date: 08/06/2022 CLINICAL DATA:  Hypoxemia. EXAM: CHEST  1 VIEW COMPARISON:  Chest radiographs 06/07/2022 and chest CT 06/09/2022 FINDINGS: A right jugular catheter terminates over the mid to lower SVC. The cardiac silhouette and pulmonary arteries remain enlarged. Interstitial and basilar predominant hazy and patchy airspace opacities are present bilaterally with indistinctness of the pulmonary vasculature. There may be trace pleural effusions. No pneumothorax is identified. No acute osseous abnormality is seen. IMPRESSION: Cardiomegaly and bilateral lung opacities consistent with edema. Electronically Signed   By: Logan Bores M.D.   On: 08/06/2022 15:41   HYBRID OR IMAGING ([facility] ONLY)  Result Date: 08/06/2022 There is no interpretation for this exam.  This order is for images obtained during a surgical procedure.  Please See "Surgeries" Tab for more information regarding the procedure.     Labs:   Basic Metabolic Panel: Recent Labs  Lab 08/06/22 0828 08/07/22 0131 08/08/22 0153  NA 137 134* 136  K 3.6 4.2 3.8  CL 96* 94* 94*  CO2  --  22 29  GLUCOSE 115* 110* 91  BUN 40* 60* 43*  CREATININE 6.70* 7.44* 5.18*  CALCIUM  --  9.8 9.8   GFR Estimated Creatinine Clearance: 9.9 mL/min (A) (by C-G formula based on SCr of 5.18 mg/dL (H)). Liver Function Tests: No results for input(s): "AST", "ALT", "ALKPHOS", "BILITOT", "PROT", "ALBUMIN" in the last 168 hours. No results for input(s): "LIPASE", "AMYLASE" in the last 168 hours. No results for input(s): "AMMONIA" in the last 168 hours. Coagulation profile No results for input(s): "INR", "PROTIME" in the last 168 hours.  CBC: Recent Labs  Lab 08/06/22 0828 08/07/22 0131 08/08/22 0153  WBC  --  6.6 6.7  NEUTROABS  --    --  5.4  HGB 8.2* 6.5* 8.5*  HCT 24.0* 20.1* 25.7*  MCV  --  102.0* 97.3  PLT  --  163 158  Cardiac Enzymes: No results for input(s): "CKTOTAL", "CKMB", "CKMBINDEX", "TROPONINI" in the last 168 hours. BNP: Invalid input(s): "POCBNP" CBG: No results for input(s): "GLUCAP" in the last 168 hours. D-Dimer No results for input(s): "DDIMER" in the last 72 hours. Hgb A1c No results for input(s): "HGBA1C" in the last 72 hours. Lipid Profile No results for input(s): "CHOL", "HDL", "LDLCALC", "TRIG", "CHOLHDL", "LDLDIRECT" in the last 72 hours. Thyroid function studies No results for input(s): "TSH", "T4TOTAL", "T3FREE", "THYROIDAB" in the last 72 hours.  Invalid input(s): "FREET3" Anemia work up No results for input(s): "VITAMINB12", "FOLATE", "FERRITIN", "TIBC", "IRON", "RETICCTPCT" in the last 72 hours. Microbiology No results found for this or any previous visit (from the past 240 hour(s)).  Time coordinating discharge: 35 minutes  Signed: Marlow Berenguer  [facility] 08/08/2022, 2:09 PM

## 2022-08-08 NOTE — Progress Notes (Signed)
D/C order noted. Contacted [facility] to advise clinic of pt's d/c today and that pt should resume care tomorrow.   Melven Sartorius Renal Navigator [phone]

## 2022-08-08 NOTE — Care Management Important Message (Signed)
Important Message  Patient Details  Name: Zavia Rozario MRN: [id number] Date of Birth: 01-12-1976   Medicare Important Message Given:  Yes     Shelda Altes 08/08/2022, 9:02 AM

## 2022-08-08 NOTE — TOC Transition Note (Signed)
Transition of Care [facility]) - CM/SW Discharge Note   Patient Details  Name: Madeline Brown MRN: [id number] Date of Birth: 15-Sep-1975  [facility]) CM/SW Contact:  Bethena Roys, RN Phone Number: 08/08/2022, 3:45 PM   Clinical Narrative:  Case Manager received a secure chat from the Staff RN that the patient will need a portable tank for travel home. Case Manager called [employer] and a portable tank to be delivered within the next 30 minutes to the room. Patient is calling [employer] for transport. NO further needs identified at this time.    Final next level of care: Home/Self Care Barriers to Discharge: No Barriers Identified   Patient Goals and CMS Choice CMS Medicare.gov Compare Post Acute Care list provided to:: Patient Choice offered to / list presented to : NA  Discharge Plan and Services Additional resources added to the After Visit Summary for       Post Acute Care Choice: NA          DME Arranged: N/A DME Agency: NA     HH Arranged: NA [place] Agency: NA   Social Determinants of Health (SDOH) Interventions SDOH Screenings   Depression (PHQ2-9): Low Risk  (03/23/2019)  Tobacco Use: High Risk (08/08/2022)   Readmission Risk Interventions    08/08/2022   11:18 AM  Readmission Risk Prevention Plan  Transportation Screening Complete  HRI or Home Care Consult Complete  Social Work Consult for [place] Planning/Counseling Complete  Palliative Care Screening Not Applicable  Medication Review Press photographer) Complete

## 2022-08-08 NOTE — Progress Notes (Signed)
Pt discharged from unit with 02. Medication/discharge instruction given.  Phoebe Sharps, RN

## 2022-08-08 NOTE — TOC Transition Note (Signed)
Transition of Care (TOC) - CM/SW Discharge Note Marvetta Gibbons RN, BSN Transitions of Care Unit 4E- RN Case Manager See Treatment Team for direct phone #   Patient Details  Name: Madeline Brown MRN: [id number] Date of Birth: [birth year]  Transition of Care [facility]) CM/SW Contact:  Dawayne Patricia, RN Phone Number: 08/08/2022, 11:18 AM   Clinical Narrative:    Notified by bedside RN that pt needs wrench/key for home 02 tanks- she has lost hers. Call made to [employer] who pt has her home 02 supplied with- liaison states she will have a wrench/key brought to room for pt to take home.   No further TOC needs noted at this time.    Final next level of care: Home/Self Care Barriers to Discharge: No Barriers Identified   Patient Goals and CMS Choice CMS Medicare.gov Compare Post Acute Care list provided to:: Patient Choice offered to / list presented to : NA  Discharge Placement                 Home        Discharge Plan and Services Additional resources added to the After Visit Summary for       Post Acute Care Choice: NA          DME Arranged: N/A DME Agency: NA       HH Arranged: NA [place] Agency: NA        Social Determinants of Health (SDOH) Interventions SDOH Screenings   Depression (PHQ2-9): Low Risk  (03/23/2019)  Tobacco Use: High Risk (08/07/2022)     Readmission Risk Interventions    08/08/2022   11:18 AM  Readmission Risk Prevention Plan  Transportation Screening Complete  HRI or Home Care Consult Complete  Social Work Consult for [place] Planning/Counseling Complete  Palliative Care Screening Not Applicable  Medication Review Press photographer) Complete

## 2022-08-08 NOTE — Progress Notes (Signed)
   08/08/22 1201  Vitals  Temp 98 F (36.7 C)  Temp Source Oral  BP (!) 106/93  MAP (mmHg) 99  BP Location Right Arm  BP Method Automatic  Patient Position (if appropriate) Lying  Pulse Rate 82  ECG Heart Rate 82  Resp (!) 21  Oxygen Therapy  SpO2 99 %  O2 Device Nasal Cannula  O2 Flow Rate (L/min) 5 L/min  During Treatment Monitoring  Intra-Hemodialysis Comments Tx completed;Tolerated well  Post Treatment  Dialyzer Clearance Lightly streaked  Duration of HD Treatment -hour(s) 3 hour(s)  Liters Processed 63  Fluid Removed (mL) 2500 mL  Tolerated HD Treatment Yes  Hemodialysis Catheter Right Subclavian Double lumen Permanent (Tunneled)  Placement Date/Time: 08/06/22 1328   Placed prior to admission: No  Serial / Lot #: LOT [id number]  Expiration Date: 12/30/26  Time Out: Correct patient;Correct site;Correct procedure  Maximum sterile barrier precautions: Hand hygiene;Cap;Mask;Sterile...  Site Condition No complications  Blue Lumen Status Dead end cap in place;Heparin locked  Red Lumen Status Dead end cap in place;Heparin locked  Purple Lumen Status N/A  Catheter fill solution Heparin 1000 units/ml  Catheter fill volume (Arterial) 1.6 cc  Catheter fill volume (Venous) 1.6  Dressing Type Transparent  Dressing Status Antimicrobial disc in place;Clean, Dry, Intact  Interventions Other (Comment)  Drainage Description None  Dressing Change Due 08/14/22  Post treatment catheter status Capped and Clamped

## 2022-08-08 NOTE — Progress Notes (Signed)
Patient sleeping and O2 not reading. Changed sites and probe. O2 sats on 4  l/m N.[place]. right hand 84% on left hand 83% with good wave form and no resp. Distress. Lungs dec. In base clr upper lobes. Patient put back on Hi Flow 6 liters per Resp. And Restaurant manager, fast food.O2 sats back up 95%

## 2022-08-08 NOTE — Progress Notes (Addendum)
[facility] Progress Note  Subjective: seen on HD, no c/o's today  Vitals:   08/08/22 1054 08/08/22 1124 08/08/22 1154 08/08/22 1201  BP: 105/86 100/85 107/84 (!) 106/93  Pulse: 88 81 73 82  Resp: 18 18 (!) 21 (!) 21  Temp:    98 F (36.7 C)  TempSrc:    Oral  SpO2: 96% 97% 98% 99%  Weight:      Height:        Exam: Gen alert, no distress, nasal O2 No jvd or bruits Chest clear bilat to bases RRR no MRG Abd soft ntnd no mass or ascites +bs Ext no LE edema Neuro is alert, Ox 3 , nf    RIJ TDC in place     Home meds include - aspirin, sensipar 30 am, renvela 2 ac tid, norvasc 10, hydralazine 100 tid, keppra, aldactone 50 qd, coreg 25 bid        OP HD: [place] TTS 3h 79mn   350/1.5   49kg  2/2 bath  AVF  Hep none  - last HD 2/20, post wt 48.2kg - hectorol 2 mcg IV tiw - mircera 225 mcg IV q 2wks, last 2/15, due on 2/29      Assessment/ Plan: S/P ligation L arm AVF - due to 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 Surgery was done  2/21 ESRD - on HD TTS. Had HD yesterday and again this am.  AHRF - w/ pulm edema by CXR 2/21. Repeat CXR after HD yest showed improved but persistent edema. HD again this am, hopefully her hypoxia will have improved. She will ambulate w/ SpO2 checks post hd, have d/w pmd.   HTN/ volume - lowering dry wt today by about 1 kg Anemia esrd - Hb 8.2 --> 6.5 yesterday. Got prbc's, Hb 8.5 this am.  MBD ckd - Ca in range. Cont meds.  Seizures - on keppra  Addendum 3:50pm: appears to be going home today, got 2.5 L off w/ pre-wt 50.4kg. New dry wt will be 48kg, and we will challenge 0.5 kg each session x 3 sessions in OP setting.   RKelly SplinterMD [facility] 08/08/2022, 12:42 PM  Recent Labs  Lab 08/07/22 0131 08/08/22 0153  HGB 6.5* 8.5*  CALCIUM 9.8 9.8  CREATININE 7.44* 5.18*  K 4.2 3.8   No results for input(s): "IRON", "TIBC", "FERRITIN" in the last 168 hours. Inpatient medications:   acetaminophen  650 mg Oral Q6H   amLODipine  10 mg Oral QHS   carvedilol  25 mg Oral BID WC   Chlorhexidine Gluconate Cloth  6 each Topical Q0600   Chlorhexidine Gluconate Cloth  6 each Topical Q0600   cinacalcet  30 mg Oral Q breakfast   heparin  5,000 Units Subcutaneous Q8H   heparin sodium (porcine)       hydrALAZINE  100 mg Oral TID   levETIRAcetam  1,000 mg Oral Daily   multivitamin  1 tablet Oral Daily   sevelamer carbonate  1,600 mg Oral TID WC   spironolactone  50 mg Oral Daily    albuterol, diphenhydrAMINE, heparin sodium (porcine), hydrALAZINE, HYDROmorphone (DILAUDID) injection, oxyCODONE, senna-docusate

## 2022-08-08 NOTE — Progress Notes (Signed)
O2 on Hi Flow 4 liters was 92% Patient wears O2 at home 4  l/m N.[place]. not high flow.Changed to nascal cannula 4 liters. Tol. Well. Sats 93%.

## 2022-08-08 NOTE — Progress Notes (Signed)
Patient Saturations on 4 Liters of oxygen while Ambulating = 94%

## 2022-08-09 LAB — HEPATITIS B SURFACE ANTIBODY, QUANTITATIVE: Hep B S AB Quant (Post): 243.5 m[IU]/mL (ref >9.9)

## 2022-08-10 ENCOUNTER — Telehealth: Payer: Self-pay | Admitting: Nephrology

## 2022-08-10 NOTE — Telephone Encounter (Signed)
Transition of care contact from inpatient facility  Date of Discharge: 08/08/22 Date of Contact: 08/10/22 Method of contact: Phone.   Attempted to contact patient to discuss transition of care from inpatient admission. Patient did not answer the phone. Will follow up at outpatient dialysis center.

## 2022-08-18 ENCOUNTER — Other Ambulatory Visit: Payer: Self-pay | Admitting: *Deleted

## 2022-08-18 DIAGNOSIS — N186 End stage renal disease: Secondary | ICD-10-CM

## 2022-09-02 ENCOUNTER — Ambulatory Visit (HOSPITAL_COMMUNITY): Payer: Medicare HMO

## 2022-09-02 ENCOUNTER — Encounter: Payer: Medicare HMO | Admitting: Vascular Surgery

## 2022-09-30 ENCOUNTER — Ambulatory Visit (HOSPITAL_COMMUNITY)
Admission: RE | Admit: 2022-09-30 | Discharge: 2022-09-30 | Disposition: A | Discharge status: Home / Self Care | Payer: Medicare HMO | Source: Ambulatory Visit | Attending: Vascular Surgery | Admitting: Vascular Surgery

## 2022-09-30 ENCOUNTER — Ambulatory Visit (INDEPENDENT_AMBULATORY_CARE_PROVIDER_SITE_OTHER)
Admission: RE | Admit: 2022-09-30 | Discharge: 2022-09-30 | Disposition: A | Discharge status: Home / Self Care | Payer: Medicare HMO | Source: Ambulatory Visit | Attending: Vascular Surgery | Admitting: Vascular Surgery

## 2022-09-30 ENCOUNTER — Encounter: Payer: Self-pay | Admitting: Vascular Surgery

## 2022-09-30 ENCOUNTER — Ambulatory Visit (INDEPENDENT_AMBULATORY_CARE_PROVIDER_SITE_OTHER): Payer: Medicare HMO | Admitting: Vascular Surgery

## 2022-09-30 VITALS — BP 135/102 | HR 79 | Temp 99.8°F | Resp 20 | Ht 60.0 in | Wt 115.8 lb

## 2022-09-30 DIAGNOSIS — N186 End stage renal disease: Secondary | ICD-10-CM | POA: Diagnosis present

## 2022-09-30 DIAGNOSIS — Z992 Dependence on renal dialysis: Secondary | ICD-10-CM

## 2022-09-30 NOTE — Progress Notes (Unsigned)
Established Dialysis Access   History of Present Illness   Madeline Brown is a 47 y.o. (Feb 06, 1976) female who presents for reevaluation of left brachiocephalic fistula.  This fistula was first created in January 2019 and superficial lysed in May 2019 by Dr. Arbie Cookey.  She required extensive excision and plication of the fistula and placement of right IJ [facility] in February 2022 by Dr. Lenell Antu.  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  She completely healed from this procedure and her right IJ [facility] was removed in June 2022.   She has now been referred back to our office for similar issues with the fistula.  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   She has been dialyzing fine through her fistula on Tuesdays, Thursdays, and Saturdays.  Since her fistula has gotten bigger and CHF has worsened, she has been dealing with more shortness of breath with activity.  She denies any steal symptoms in the left hand.  Current Outpatient Medications  Medication Sig Dispense Refill   acetaminophen (TYLENOL) 500 MG tablet Take 500-1,000 mg by mouth every 6 (six) hours as needed for mild pain or headache.     amLODipine (NORVASC) 10 MG tablet Take 1 tablet (10 mg total) by mouth at bedtime. 30 tablet 0   aspirin EC 81 MG tablet Take 81 mg by mouth daily as needed (chest pain). Swallow whole.     B Complex-C-Folic Acid (DIALYVITE TABLET) TABS Take 1 tablet by mouth daily.     cinacalcet (SENSIPAR) 30 MG tablet Take 30 mg by mouth daily with breakfast.     diphenhydrAMINE HCl (BENADRYL PO) Take 2 tablets by mouth Every Tuesday,Thursday,and Saturday with dialysis. As needed     hydrALAZINE (APRESOLINE) 100 MG tablet Take 1 tablet (100 mg total) by mouth 3 (three) times daily. 90 tablet 0   levETIRAcetam (KEPPRA) 1000 MG tablet Take 1,000 mg by mouth  daily.     oxyCODONE-acetaminophen (PERCOCET) 5-325 MG tablet Take 1 tablet by mouth every 6 (six) hours as needed for severe pain. 12 tablet 0   OXYGEN Inhale 3 L into the lungs continuous.     sevelamer carbonate (RENVELA) 800 MG tablet Take 2 tablets (1,600 mg total) by mouth 3 (three) times daily with meals. (Patient taking differently: Take 1,600 mg by mouth See admin instructions. Take 1,600 mg by mouth three times a day with meals and 800 mg with each snack) 30 tablet 0   spironolactone (ALDACTONE) 50 MG tablet Take 1 tablet (50 mg total) by mouth daily. 30 tablet 0   carvedilol (COREG) 25 MG tablet Take 1 tablet (25 mg total) by mouth 2 (two) times daily with a meal. Take 25 mg by mouth at 6:30 AM and 6:00 PM on Sun/Mon/Wed/Fri and 25 mg at 11:00 AM and 8:00 PM on Tues/Thurs/Sat     Current Facility-Administered Medications  Medication Dose Route Frequency Provider Last Rate Last Admin   sodium chloride flush (NS) 0.9 % injection 3 mL  3 mL Intravenous Q12H Wendall Stade, MD        REVIEW OF SYSTEMS (negative unless checked):   Cardiac:  []  Chest pain or chest pressure? [x]  Shortness of breath upon activity? []  Shortness of breath when lying flat? []  Irregular heart rhythm?  Vascular:  []  Pain in calf, thigh, or hip brought  on by walking? []  Pain in feet at night that wakes you up from your sleep? []  Blood clot in your veins? []  Leg swelling?  Pulmonary:  []  Oxygen at home? []  Productive cough? []  Wheezing?  Neurologic:  []  Sudden weakness in arms or legs? []  Sudden numbness in arms or legs? []  Sudden onset of difficult speaking or slurred speech? []  Temporary loss of vision in one eye? []  Problems with dizziness?  Gastrointestinal:  []  Blood in stool? []  Vomited blood?  Genitourinary:  []  Burning when urinating? []  Blood in urine?  Psychiatric:  []  Major depression  Hematologic:  []  Bleeding problems? []  Problems with blood clotting?  Dermatologic:  []   Rashes or ulcers?  Constitutional:  []  Fever or chills?  Ear/Nose/Throat:  []  Change in hearing? []  Nose bleeds? []  Sore throat?  Musculoskeletal:  []  Back pain? []  Joint pain? []  Muscle pain?   Physical Examination   Vitals:   09/30/22 1542  BP: (!) 135/102  Pulse: 79  Resp: 20  Temp: 99.8 F (37.7 C)  SpO2: 96%  Weight: 115 lb 12.8 oz (52.5 kg)  Height: 5' (1.524 m)   Body mass index is 22.62 kg/m.  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  Fistula has a palpable thrill with some pulsatility Musculoskeletal: no muscle wasting or atrophy  Neurologic: A&O X 3;  No focal weakness or paresthesias are detected Psychiatric:  The pt has Normal affect.   Non-invasive Vascular Imaging   Left Arm Access Duplex  (08/01/2022):  +--------------------+----------+-----------------+--------+  AVF                PSV (cm/s)Flow Vol (mL/min)Comments  +--------------------+----------+-----------------+--------+  Native artery inflow   184          5304                 +--------------------+----------+-----------------+--------+  AVF Anastomosis        198                               +--------------------+----------+-----------------+--------+     +------------+----------+-------------+----------+----------+  OUTFLOW VEINPSV (cm/s)Diameter (cm)Depth (cm) Describe   +------------+----------+-------------+----------+----------+  Prox UA         64        2.48        0.26   aneurysmal  +------------+----------+-------------+----------+----------+  Mid UA          49        2.52        0.28   aneurysmal   +------------+----------+-------------+----------+----------+  Dist UA         51        2.47        0.29   aneurysmal  +------------+----------+-------------+----------+----------+  AC Fossa       158        2.99        0.23   aneurysmal  +------------+----------+-------------+----------+----------+        Summary:  Arteriovenous fistula-Aneurysmal dilatation noted.  Arteriovenous graft-Velocities of less than 100cm/s noted.  Volume flow of 5304 mL/min noted in the inflow artery.     Medical Decision Making   Toriona Depies is a 47 y.o. 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  This fistula required extensive revision with plication in 2022 with Dr. Lenell Antu because it was severely aneurysmal with high output of 4 L/min, causing strain on the heart.  She healed this revision in June 2022 and resumed dialysis through it without issue.  She returns again with the same issues.  Her fistula is severely aneurysmal throughout with flow of 5 L/min.  She has been dealing with likely high output CHF and volume overload, due to the high flow rate of this fistula She has not been having any steal symptoms.  She has a palpable left radial pulse I explained to the patient she will need revision with plication and placement of TDC catheter in the next few weeks with Dr. Lenell Antu.  There is some likelihood that this fistula may fail even with revision.  Hopefully after revision this will improve her cardiac status   Loel Dubonnet PA-C [facility] Office: [phone]  Clinic MD: Karin Lieu

## 2022-09-30 NOTE — H&P (View-Only) (Signed)
  Established Dialysis Access   History of Present Illness   Madeline Brown is a 46 y.o. (03/30/1976) female who presents for reevaluation of left brachiocephalic fistula.  This fistula was first created in January 2019 and superficial lysed in May 2019 by Dr. Early.  She required extensive excision and plication of the fistula and placement of right IJ TDC in February 2022 by Dr. Roann Merk.  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  She completely healed from this procedure and her right IJ TDC was removed in June 2022.   She has now been referred back to our office for similar issues with the fistula.  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   She has been dialyzing fine through her fistula on Tuesdays, Thursdays, and Saturdays.  Since her fistula has gotten bigger and CHF has worsened, she has been dealing with more shortness of breath with activity.  She denies any steal symptoms in the left hand.  Current Outpatient Medications  Medication Sig Dispense Refill   acetaminophen (TYLENOL) 500 MG tablet Take 500-1,000 mg by mouth every 6 (six) hours as needed for mild pain or headache.     amLODipine (NORVASC) 10 MG tablet Take 1 tablet (10 mg total) by mouth at bedtime. 30 tablet 0   aspirin EC 81 MG tablet Take 81 mg by mouth daily as needed (chest pain). Swallow whole.     B Complex-C-Folic Acid (DIALYVITE TABLET) TABS Take 1 tablet by mouth daily.     cinacalcet (SENSIPAR) 30 MG tablet Take 30 mg by mouth daily with breakfast.     diphenhydrAMINE HCl (BENADRYL PO) Take 2 tablets by mouth Every Tuesday,Thursday,and Saturday with dialysis. As needed     hydrALAZINE (APRESOLINE) 100 MG tablet Take 1 tablet (100 mg total) by mouth 3 (three) times daily. 90 tablet 0   levETIRAcetam (KEPPRA) 1000 MG tablet Take 1,000 mg by mouth  daily.     oxyCODONE-acetaminophen (PERCOCET) 5-325 MG tablet Take 1 tablet by mouth every 6 (six) hours as needed for severe pain. 12 tablet 0   OXYGEN Inhale 3 L into the lungs continuous.     sevelamer carbonate (RENVELA) 800 MG tablet Take 2 tablets (1,600 mg total) by mouth 3 (three) times daily with meals. (Patient taking differently: Take 1,600 mg by mouth See admin instructions. Take 1,600 mg by mouth three times a day with meals and 800 mg with each snack) 30 tablet 0   spironolactone (ALDACTONE) 50 MG tablet Take 1 tablet (50 mg total) by mouth daily. 30 tablet 0   carvedilol (COREG) 25 MG tablet Take 1 tablet (25 mg total) by mouth 2 (two) times daily with a meal. Take 25 mg by mouth at 6:30 AM and 6:00 PM on Sun/Mon/Wed/Fri and 25 mg at 11:00 AM and 8:00 PM on Tues/Thurs/Sat     Current Facility-Administered Medications  Medication Dose Route Frequency Provider Last Rate Last Admin   sodium chloride flush (NS) 0.9 % injection 3 mL  3 mL Intravenous Q12H Nishan, Peter C, MD        REVIEW OF SYSTEMS (negative unless checked):   Cardiac:  [] Chest pain or chest pressure? [x] Shortness of breath upon activity? [] Shortness of breath when lying flat? [] Irregular heart rhythm?  Vascular:  [] Pain in calf, thigh, or hip brought   on by walking? [] Pain in feet at night that wakes you up from your sleep? [] Blood clot in your veins? [] Leg swelling?  Pulmonary:  [] Oxygen at home? [] Productive cough? [] Wheezing?  Neurologic:  [] Sudden weakness in arms or legs? [] Sudden numbness in arms or legs? [] Sudden onset of difficult speaking or slurred speech? [] Temporary loss of vision in one eye? [] Problems with dizziness?  Gastrointestinal:  [] Blood in stool? [] Vomited blood?  Genitourinary:  [] Burning when urinating? [] Blood in urine?  Psychiatric:  [] Major depression  Hematologic:  [] Bleeding problems? [] Problems with blood clotting?  Dermatologic:  []  Rashes or ulcers?  Constitutional:  [] Fever or chills?  Ear/Nose/Throat:  [] Change in hearing? [] Nose bleeds? [] Sore throat?  Musculoskeletal:  [] Back pain? [] Joint pain? [] Muscle pain?   Physical Examination   Vitals:   09/30/22 1542  BP: (!) 135/102  Pulse: 79  Resp: 20  Temp: 99.8 F (37.7 C)  SpO2: 96%  Weight: 115 lb 12.8 oz (52.5 kg)  Height: 5' (1.524 m)   Body mass index is 22.62 kg/m.  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  Fistula has a palpable thrill with some pulsatility Musculoskeletal: no muscle wasting or atrophy  Neurologic: A&O X 3;  No focal weakness or paresthesias are detected Psychiatric:  The pt has Normal affect.   Non-invasive Vascular Imaging   Left Arm Access Duplex  (08/01/2022):  +--------------------+----------+-----------------+--------+  AVF                PSV (cm/s)Flow Vol (mL/min)Comments  +--------------------+----------+-----------------+--------+  Native artery inflow   184          5304                 +--------------------+----------+-----------------+--------+  AVF Anastomosis        198                               +--------------------+----------+-----------------+--------+     +------------+----------+-------------+----------+----------+  OUTFLOW VEINPSV (cm/s)Diameter (cm)Depth (cm) Describe   +------------+----------+-------------+----------+----------+  Prox UA         64        2.48        0.26   aneurysmal  +------------+----------+-------------+----------+----------+  Mid UA          49        2.52        0.28   aneurysmal   +------------+----------+-------------+----------+----------+  Dist UA         51        2.47        0.29   aneurysmal  +------------+----------+-------------+----------+----------+  AC Fossa       158        2.99        0.23   aneurysmal  +------------+----------+-------------+----------+----------+        Summary:  Arteriovenous fistula-Aneurysmal dilatation noted.  Arteriovenous graft-Velocities of less than 100cm/s noted.  Volume flow of 5304 mL/min noted in the inflow artery.     Medical Decision Making   Madeline Brown is a 46 y.o. 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  This fistula required extensive revision with plication in 2022 with Dr. Gyan Cambre because it was severely aneurysmal with high output of 4 L/min, causing strain on the heart.  She healed this revision in June 2022 and resumed dialysis through it without issue.  She returns again with the same issues.  Her fistula is severely aneurysmal throughout with flow of 5 L/min.  She has been dealing with likely high output CHF and volume overload, due to the high flow rate of this fistula She has not been having any steal symptoms.  She has a palpable left radial pulse I explained to the patient she will need revision with plication and placement of TDC catheter in the next few weeks with Dr. Niall Illes.  There is some likelihood that this fistula may fail even with revision.  Hopefully after revision this will improve her cardiac status   Madeline Schuh PA-C [facility] [place] Office: [phone]  Clinic MD: [place] 

## 2022-10-01 ENCOUNTER — Telehealth: Payer: Self-pay

## 2022-10-01 NOTE — Telephone Encounter (Signed)
Attempted to reach patient to schedule surgery, no answer. Unable to leave vm due to mailbox full.

## 2022-10-02 ENCOUNTER — Other Ambulatory Visit: Payer: Self-pay

## 2022-10-02 DIAGNOSIS — N186 End stage renal disease: Secondary | ICD-10-CM

## 2022-10-13 ENCOUNTER — Ambulatory Visit: Payer: Medicare HMO | Attending: Cardiovascular Disease | Admitting: Cardiovascular Disease

## 2022-10-13 ENCOUNTER — Encounter: Payer: Self-pay | Admitting: Cardiovascular Disease

## 2022-10-13 VITALS — BP 130/80 | HR 77 | Ht 60.0 in | Wt 119.6 lb

## 2022-10-13 DIAGNOSIS — N186 End stage renal disease: Secondary | ICD-10-CM

## 2022-10-13 DIAGNOSIS — I3139 Other pericardial effusion (noninflammatory): Secondary | ICD-10-CM

## 2022-10-13 DIAGNOSIS — I5033 Acute on chronic diastolic (congestive) heart failure: Secondary | ICD-10-CM

## 2022-10-13 DIAGNOSIS — I10 Essential (primary) hypertension: Secondary | ICD-10-CM | POA: Diagnosis not present

## 2022-10-13 NOTE — Patient Instructions (Addendum)
Medication Instructions:  Your physician recommends that you continue on your current medications as directed. Please refer to the Current Medication list given to you today.  *If you need a refill on your cardiac medications before your next appointment, please call your pharmacy*  Lab Work: If you have labs (blood work) drawn today and your tests are completely normal, you will receive your results only by: MyChart Message (if you have MyChart) OR A paper copy in the mail If you have any lab test that is abnormal or we need to change your treatment, we will call you to review the results.  Testing/Procedures: None ordered today.  Follow-Up: At [facility], you and your health needs are our priority.  As part of our continuing mission to provide you with exceptional heart care, we have created designated Provider Care Teams.  These Care Teams include your primary Cardiologist (physician) and Advanced Practice Providers (APPs -  Physician Assistants and Nurse Practitioners) who all work together to provide you with the care you need, when you need it.  We recommend signing up for the patient portal called "MyChart".  Sign up information is provided on this After Visit Summary.  MyChart is used to connect with patients for Virtual Visits (Telemedicine).  Patients are able to view lab/test results, encounter notes, upcoming appointments, etc.  Non-urgent messages can be sent to your provider as well.   To learn more about what you can do with MyChart, go to [email or web address].    Your next appointment:   6 month(s)  Provider:   Peter Nishan, MD     

## 2022-10-13 NOTE — Telephone Encounter (Addendum)
Spoke with patient and scheduled for surgery on 10/20/22. Advised patient provider is not available on 5/8 as previously scheduled. Instructions reviewed and she verbalized understanding.

## 2022-10-13 NOTE — Progress Notes (Signed)
Cardiology Office Note   Date:  10/13/2022   ID:  Madeline Brown, DOB [birth year], MRN [id number]  PCP:  Raymon Mutton., FNP  Cardiologist:   Charlton Haws, MD   No chief complaint on file.     History of Present Illness: Madeline Brown is a 47 y.o. female who presents for post hospital f/u D/c 06/10/22 with volume overload and CHF with hypoxia. Previously seen for pericardial effusion from uremia/minoxidil  that has cleared on subsequent TTE;s She has CRF on dialysis with large LUE fistula   Hospitalized 12/07/19 with HTN emergency ICH and seizures BP meds adjusted and started on Keppra She was non compliant with her meds and presented with pulmonary edema   She is being followed for transplant at [facility] Had stress test in October that was fine  Has 5 kids can help and she has better support system  Most recent hospitalization 12/23-26/23 CHF with volume overload CTA chest no pneumonia or PE Improved with more dialysis D/c weight around 108 lbs had transfusion for anemia and left before any iv iron given Hct as low as 22.3 BNP was only 344   Echo 02/04/22 EF 60-65% severe LVH moderate bi atrial enlargement no significant valve dx   Right heart cath 07/09/22 confirmed high output CHF with CO > 8L/min Fistula ligated again 09/30/22 by Dr Lenell Antu Still needs permanent dialysis access This is scheduled for 10/22/22 She feels much better with less dyspnea since surgery    Past Medical History:  Diagnosis Date   Anemia    ESRD on HD    T-TH-S dialysis   Headache, unspecified 09/26/2017   History of blood transfusion 07/2020   Hypertension    Intracranial hemorrhage (HCC)    LVH (left ventricular hypertrophy)    Pericardial effusion    Pneumonia    Pulmonary hypertension (HCC)    Seizure (HCC) 02/2019   last one was in 02/2019   Shortness of breath 07/15/2018   on 3L via [place] prn   Stroke (HCC)    Mild was told after having a seizure   Substance abuse (HCC)    Marijuana    Thrombocytopenia (HCC)     Past Surgical History:  Procedure Laterality Date   AV FISTULA PLACEMENT Left 06/24/2017   Procedure: CREATION OF LEFT ARM BRACHIALCEPHALIC  ARTERIOVENOUS (AV) FISTULA;  Surgeon: Larina Earthly, MD;  Location: [facility] OR;  Service: Vascular;  Laterality: Left;   CESAREAN SECTION     x2   COMPLEX WOUND CLOSURE Left 07/30/2020   Procedure: COMPLEX WOUND CLOSURE;  Surgeon: Leonie Douglas, MD;  Location: [facility] OR;  Service: Vascular;  Laterality: Left;   FISTULA SUPERFICIALIZATION Left 10/28/2017   Procedure: SUPERFICIALIZATION LEFT BRACHIOCEPHALIC ARTERIOVENOUS FISTULA;  Surgeon: Larina Earthly, MD;  Location: [facility] OR;  Service: Vascular;  Laterality: Left;   INGUINAL HERNIA REPAIR Bilateral 02/01/2021   Procedure: LAPAROSCOPIC BILATERAL INGUINAL HERNIA REPAIR WITH MESH;  Surgeon: Kinsinger, De Blanch, MD;  Location: [facility] ORS;  Service: General;  Laterality: Bilateral;   INSERTION OF DIALYSIS CATHETER Right 06/24/2017   Procedure: INSERTION OF TUNNELED DIALYSIS CATHETER;  Surgeon: Larina Earthly, MD;  Location: [facility];  Service: Vascular;  Laterality: Right;   INSERTION OF DIALYSIS CATHETER Right 07/30/2020   Procedure: INSERTION OF DIALYSIS CATHETER;  Surgeon: Leonie Douglas, MD;  Location: [facility];  Service: Vascular;  Laterality: Right;   INSERTION OF DIALYSIS CATHETER Right 08/06/2022   Procedure: INSERTION OF TUNNELED DIALYSIS CATHETER;  Surgeon: Leonie Douglas, MD;  Location: [facility] OR;  Service: Vascular;  Laterality: Right;   LIGATION OF COMPETING BRANCHES OF ARTERIOVENOUS FISTULA  10/28/2017   Procedure: LIGATION OF COMPETING BRANCHES OF ARTERIOVENOUS FISTULA x3;  Surgeon: Larina Earthly, MD;  Location: [facility];  Service: Vascular;;   REVISON OF ARTERIOVENOUS FISTULA Left 07/30/2020   Procedure: LEFT UPPER EXTREMITY ARTERIOVENOUS FISTULA REVISON;  Surgeon: Leonie Douglas, MD;  Location: [facility];  Service: Vascular;  Laterality: Left;  PERIPHERAL NERVE BLOCK   REVISON OF ARTERIOVENOUS  FISTULA Left 08/06/2022   Procedure: LEFT UPPER EXTREMITY FISTULA LIGATION;  Surgeon: Leonie Douglas, MD;  [facility]: [facility];  Service: Vascular;  Laterality: Left;   RIGHT HEART CATH N/A 07/09/2022   Procedure: RIGHT HEART CATH;  Surgeon: Dorthula Nettles, DO;  [facility]: [facility] INV[facility]IVE CV [facility];  Service: Cardiovascular;  Laterality: N/A;     Current Outpatient Medications  Medication Sig Dispense Refill   atorvastatin (LIPITOR) 20 MG tablet Take by mouth.     Doxercalciferol (HECTOROL IV) Doxercalciferol (Hectorol)     VELPHORO 500 MG chewable tablet Chew by mouth.     acetaminophen (TYLENOL) 500 MG tablet Take 500-1,000 mg by mouth every 6 (six) hours as needed for mild pain or headache.     amLODipine (NORVASC) 10 MG tablet Take 1 tablet (10 mg total) by mouth at bedtime. 30 tablet 0   aspirin EC 81 MG tablet Take 81 mg by mouth daily as needed (chest pain). Swallow whole.     B Complex-C-Folic Acid (DIALYVITE TABLET) TABS Take 1 tablet by mouth daily.     carvedilol (COREG) 25 MG tablet Take 1 tablet (25 mg total) by mouth 2 (two) times daily with a meal. Take 25 mg by mouth at 6:30 AM and 6:00 PM on Sun/Mon/Wed/Fri and 25 mg at 11:00 AM and 8:00 PM on Tues/Thurs/Sat     cinacalcet (SENSIPAR) 30 MG tablet Take 30 mg by mouth daily with breakfast.     hydrALAZINE (APRESOLINE) 100 MG tablet Take 1 tablet (100 mg total) by mouth 3 (three) times daily. 90 tablet 0   levETIRAcetam (KEPPRA) 1000 MG tablet Take 1,000 mg by mouth daily.     sevelamer carbonate (RENVELA) 800 MG tablet Take 2 tablets (1,600 mg total) by mouth 3 (three) times daily with meals. (Patient taking differently: Take 1,600 mg by mouth See admin instructions. Take 1,600 mg by mouth three times a day with meals and 800 mg with each snack) 30 tablet 0   spironolactone (ALDACTONE) 50 MG tablet Take 1 tablet (50 mg total) by mouth daily. 30 tablet 0   Current Facility-Administered Medications  Medication Dose Route Frequency  Provider Last Rate Last Admin   sodium chloride flush (NS) 0.9 % injection 3 mL  3 mL Intravenous Q12H Wendall Stade, MD        Allergies:   Visine [tetrahydrozoline hcl]    Social History:  The patient  reports that she has been smoking cigarettes. She started smoking about 28 years ago. She has a 6.75 pack-year smoking history. She has been exposed to tobacco smoke. She has never used smokeless tobacco. She reports that she does not currently use alcohol. She reports current drug use. Drug: Marijuana.   Family History:  The patient's family history includes Aneurysm in her mother; Cancer in her father; Diabetes in her father; Heart disease in her father; Hypertension in her father; Seizures in her mother.    ROS:  Please  see the history of present illness.   Otherwise, review of systems are positive for none.   All other systems are reviewed and negative.    PHYSICAL EXAM: VS:  BP 130/80   Pulse 77   Ht 5' (1.524 m)   Wt 119 lb 9.6 oz (54.3 kg)   LMP 12/14/2017   SpO2 96%   BMI 23.36 kg/m  , BMI Body mass index is 23.36 kg/m. Affect appropriate Chronically ill black female  HEENT: normal Neck supple with no adenopathy JVP normal no bruits no thyromegaly Lungs clear with no wheezing and good diaphragmatic motion Heart:  S1/S2 continuous shunt murmur, no rub, gallop or click PMI normal Abdomen: benighn, BS positve, no tenderness, no AAA no bruit.  No HSM or HJR Distal pulses intact with no bruits No edema Neuro non-focal Skin warm and dry No muscular weakness LUE fistula is gigantic now ligated Temp cath right subclavian   EKG:  07/12/18 SR LAE LVH low voltage    Recent Labs: 02/02/2022: B Natriuretic Peptide 344.6 02/04/2022: Magnesium 2.3 06/07/2022: ALT 10 08/08/2022: BUN 43; Creatinine, Ser 5.18; Hemoglobin 8.5; Platelets 158; Potassium 3.8; Sodium 136    Lipid Panel    Component Value Date/Time   CHOL 172 03/23/2019 0839   TRIG 55 03/23/2019 0839   HDL 93  03/23/2019 0839   CHOLHDL 1.8 03/23/2019 0839   CHOLHDL 2.7 02/21/2019 0425   VLDL 10 02/21/2019 0425   LDLCALC 68 03/23/2019 0839      Wt Readings from Last 3 Encounters:  10/13/22 119 lb 9.6 oz (54.3 kg)  09/30/22 115 lb 12.8 oz (52.5 kg)  08/08/22 111 lb 1.8 oz (50.4 kg)      Other studies Reviewed: Additional studies/ records that were reviewed today include: Notes from hospitalization January 2020 [facility] consult Note labs CTA, CXR and TTE .    ASSESSMENT AND PLAN:  1.  Pericardial effusion: likely related to uremia/minoxidil . Resolved on f/u echo 12/08/19 and 02/04/22  2.  HTN:  Well controlled.  Continue current medications and  low sodium Dash type diet.   3  CRF:  Continue dialysis High output CHF resolved post ligation STill makes urine with aldactone New fistula 10/22/22 with gortex graft so it won't dilate Ok to proceed  4. Pulmonary HTN:  F/u pulmonary needs sleep study should be improved with lower volume  5. ICH: previous stable on Keppra for seizure prophylaxis    Current medicines are reviewed at length with the patient today.  The patient does not have concerns regarding medicines.  The following changes have been made:  no change  Labs/ tests ordered today include:    No orders of the defined types were placed in this encounter.    Disposition:   FU with [place] in 6 months     Signed, Charlton Haws, MD  10/13/2022 9:36 AM    [facility] [address] [place], [place], [place]  [address] Phone: [phone]; Fax: [phone]

## 2022-10-17 ENCOUNTER — Encounter (HOSPITAL_COMMUNITY): Payer: Self-pay | Admitting: Vascular Surgery

## 2022-10-17 ENCOUNTER — Other Ambulatory Visit: Payer: Self-pay

## 2022-10-17 NOTE — Progress Notes (Signed)
Madeline Brown denies chest pain or shortness of breath.  Patient denies having any s/s of Covid in her household, also denies any known exposure to Covid. Madeline Brown  any s/s of upper or lower respiratory in the past 8 weeks.   Madeline Peglow PCP is Dr. Fatima Sanger, FNP, cardiologist is Dr. Eden Emms.

## 2022-10-20 ENCOUNTER — Ambulatory Visit (HOSPITAL_COMMUNITY)
Admission: RE | Admit: 2022-10-20 | Discharge: 2022-10-20 | Disposition: A | Discharge status: Home / Self Care | Payer: Medicare HMO | Attending: Vascular Surgery | Admitting: Vascular Surgery

## 2022-10-20 ENCOUNTER — Other Ambulatory Visit: Payer: Self-pay

## 2022-10-20 ENCOUNTER — Ambulatory Visit (HOSPITAL_BASED_OUTPATIENT_CLINIC_OR_DEPARTMENT_OTHER): Payer: Medicare HMO | Admitting: Certified Registered Nurse Anesthetist

## 2022-10-20 ENCOUNTER — Encounter (HOSPITAL_COMMUNITY)
Admission: RE | Disposition: A | Discharge status: Home / Self Care | Payer: Self-pay | Source: Home / Self Care | Attending: Vascular Surgery

## 2022-10-20 ENCOUNTER — Ambulatory Visit (HOSPITAL_COMMUNITY): Payer: Medicare HMO | Admitting: Certified Registered Nurse Anesthetist

## 2022-10-20 ENCOUNTER — Encounter (HOSPITAL_COMMUNITY): Payer: Self-pay | Admitting: Vascular Surgery

## 2022-10-20 DIAGNOSIS — N185 Chronic kidney disease, stage 5: Secondary | ICD-10-CM | POA: Diagnosis not present

## 2022-10-20 DIAGNOSIS — R569 Unspecified convulsions: Secondary | ICD-10-CM | POA: Diagnosis not present

## 2022-10-20 DIAGNOSIS — N186 End stage renal disease: Secondary | ICD-10-CM

## 2022-10-20 DIAGNOSIS — D631 Anemia in chronic kidney disease: Secondary | ICD-10-CM | POA: Diagnosis not present

## 2022-10-20 DIAGNOSIS — Z992 Dependence on renal dialysis: Secondary | ICD-10-CM

## 2022-10-20 DIAGNOSIS — I120 Hypertensive chronic kidney disease with stage 5 chronic kidney disease or end stage renal disease: Secondary | ICD-10-CM | POA: Diagnosis not present

## 2022-10-20 DIAGNOSIS — I132 Hypertensive heart and chronic kidney disease with heart failure and with stage 5 chronic kidney disease, or end stage renal disease: Secondary | ICD-10-CM | POA: Diagnosis not present

## 2022-10-20 DIAGNOSIS — F17200 Nicotine dependence, unspecified, uncomplicated: Secondary | ICD-10-CM | POA: Diagnosis not present

## 2022-10-20 DIAGNOSIS — Z8673 Personal history of transient ischemic attack (TIA), and cerebral infarction without residual deficits: Secondary | ICD-10-CM | POA: Diagnosis not present

## 2022-10-20 DIAGNOSIS — I509 Heart failure, unspecified: Secondary | ICD-10-CM | POA: Insufficient documentation

## 2022-10-20 DIAGNOSIS — F17210 Nicotine dependence, cigarettes, uncomplicated: Secondary | ICD-10-CM

## 2022-10-20 HISTORY — DX: Heart failure, unspecified: I50.9

## 2022-10-20 HISTORY — PX: AV FISTULA PLACEMENT: SHX1204

## 2022-10-20 LAB — POCT I-STAT, CHEM 8
BUN: 78 mg/dL — ABNORMAL HIGH (ref 6–20)
Calcium, Ion: 1.12 mmol/L — ABNORMAL LOW (ref 1.15–1.40)
Chloride: 101 mmol/L (ref 98–111)
Creatinine, Ser: 11.1 mg/dL — ABNORMAL HIGH (ref 0.44–1.00)
Glucose, Bld: 80 mg/dL (ref 70–99)
HCT: 39 % (ref 36.0–46.0)
Hemoglobin: 13.3 g/dL (ref 12.0–15.0)
Potassium: 4.1 mmol/L (ref 3.5–5.1)
Sodium: 136 mmol/L (ref 135–145)
TCO2: 23 mmol/L (ref 22–32)

## 2022-10-20 SURGERY — INSERTION OF ARTERIOVENOUS (AV) GORE-TEX GRAFT ARM
Anesthesia: Monitor Anesthesia Care | Laterality: Right

## 2022-10-20 MED ORDER — OXYCODONE-ACETAMINOPHEN 5-325 MG PO TABS: 1.0000 | ORAL_TABLET | ORAL | 0 refills | Status: DC | PRN

## 2022-10-20 MED ORDER — LIDOCAINE-EPINEPHRINE (PF) 1.5 %-1:200000 IJ SOLN
INTRAMUSCULAR | Status: DC | PRN
  Administered 2022-10-20: 20 mL via PERINEURAL

## 2022-10-20 MED ORDER — CHLORHEXIDINE GLUCONATE 4 % EX SOLN: 60.0000 mL | Freq: Once | CUTANEOUS | Status: DC

## 2022-10-20 MED ORDER — SODIUM CHLORIDE 0.9 % IV SOLN: INTRAVENOUS | Status: DC

## 2022-10-20 MED ORDER — FENTANYL CITRATE (PF) 250 MCG/5ML IJ SOLN
INTRAMUSCULAR | Status: DC | PRN
  Administered 2022-10-20: 50 ug via INTRAVENOUS
  Administered 2022-10-20: 25 ug via INTRAVENOUS

## 2022-10-20 MED ORDER — HEPARIN 6000 UNIT IRRIGATION SOLUTION
Status: AC
  Filled 2022-10-20: qty 500

## 2022-10-20 MED ORDER — HEPARIN 6000 UNIT IRRIGATION SOLUTION
Status: DC | PRN
  Administered 2022-10-20: 1

## 2022-10-20 MED ORDER — FENTANYL CITRATE (PF) 100 MCG/2ML IJ SOLN
25.0000 ug | INTRAMUSCULAR | Status: DC | PRN
  Administered 2022-10-20 (×2): 25 ug via INTRAVENOUS

## 2022-10-20 MED ORDER — OXYCODONE HCL 5 MG/5ML PO SOLN: 5.0000 mg | Freq: Once | ORAL | Status: AC | PRN

## 2022-10-20 MED ORDER — CHLORHEXIDINE GLUCONATE 0.12 % MT SOLN
15.0000 mL | Freq: Once | OROMUCOSAL | Status: AC
  Administered 2022-10-20: 15 mL via OROMUCOSAL
  Filled 2022-10-20: qty 15

## 2022-10-20 MED ORDER — LIDOCAINE 2% (20 MG/ML) 5 ML SYRINGE
INTRAMUSCULAR | Status: AC
  Filled 2022-10-20: qty 5

## 2022-10-20 MED ORDER — CEFAZOLIN SODIUM-DEXTROSE 2-4 GM/100ML-% IV SOLN
2.0000 g | INTRAVENOUS | Status: AC
  Administered 2022-10-20: 2 g via INTRAVENOUS
  Filled 2022-10-20: qty 100

## 2022-10-20 MED ORDER — FENTANYL CITRATE (PF) 100 MCG/2ML IJ SOLN
INTRAMUSCULAR | Status: AC
  Filled 2022-10-20: qty 2

## 2022-10-20 MED ORDER — FENTANYL CITRATE (PF) 250 MCG/5ML IJ SOLN
INTRAMUSCULAR | Status: AC
  Filled 2022-10-20: qty 5

## 2022-10-20 MED ORDER — LIDOCAINE-EPINEPHRINE (PF) 1 %-1:200000 IJ SOLN
INTRAMUSCULAR | Status: AC
  Filled 2022-10-20: qty 30

## 2022-10-20 MED ORDER — OXYCODONE HCL 5 MG PO TABS
ORAL_TABLET | ORAL | Status: AC
  Filled 2022-10-20: qty 1

## 2022-10-20 MED ORDER — ORAL CARE MOUTH RINSE: 15.0000 mL | Freq: Once | OROMUCOSAL | Status: AC

## 2022-10-20 MED ORDER — MEPIVACAINE HCL (PF) 2 % IJ SOLN
INTRAMUSCULAR | Status: DC | PRN
  Administered 2022-10-20: 10 mL

## 2022-10-20 MED ORDER — ROCURONIUM BROMIDE 10 MG/ML (PF) SYRINGE
PREFILLED_SYRINGE | INTRAVENOUS | Status: AC
  Filled 2022-10-20: qty 10

## 2022-10-20 MED ORDER — PROPOFOL 1000 MG/100ML IV EMUL
INTRAVENOUS | Status: AC
  Filled 2022-10-20: qty 100

## 2022-10-20 MED ORDER — LIDOCAINE-EPINEPHRINE (PF) 1 %-1:200000 IJ SOLN
INTRAMUSCULAR | Status: DC | PRN
  Administered 2022-10-20: 9 mL

## 2022-10-20 MED ORDER — OXYCODONE HCL 5 MG PO TABS
5.0000 mg | ORAL_TABLET | Freq: Once | ORAL | Status: AC | PRN
  Administered 2022-10-20: 5 mg via ORAL

## 2022-10-20 MED ORDER — MIDAZOLAM HCL 2 MG/2ML IJ SOLN
INTRAMUSCULAR | Status: AC
  Filled 2022-10-20: qty 2

## 2022-10-20 MED ORDER — PROPOFOL 500 MG/50ML IV EMUL
INTRAVENOUS | Status: DC | PRN
  Administered 2022-10-20: 25 ug/kg/min via INTRAVENOUS

## 2022-10-20 MED ORDER — MIDAZOLAM HCL 2 MG/2ML IJ SOLN
INTRAMUSCULAR | Status: DC | PRN
  Administered 2022-10-20 (×2): 1 mg via INTRAVENOUS

## 2022-10-20 MED ORDER — ONDANSETRON HCL 4 MG/2ML IJ SOLN
INTRAMUSCULAR | Status: AC
  Filled 2022-10-20: qty 2

## 2022-10-20 MED ORDER — ONDANSETRON HCL 4 MG/2ML IJ SOLN: 4.0000 mg | Freq: Once | INTRAMUSCULAR | Status: DC | PRN

## 2022-10-20 MED ORDER — PROPOFOL 10 MG/ML IV BOLUS
INTRAVENOUS | Status: AC
  Filled 2022-10-20: qty 20

## 2022-10-20 SURGICAL SUPPLY — 37 items
APL PRP STRL LF DISP 70% ISPRP (MISCELLANEOUS) ×1
APL SKNCLS STERI-STRIP NONHPOA (GAUZE/BANDAGES/DRESSINGS) ×1
ARMBAND PINK RESTRICT EXTREMIT (MISCELLANEOUS) ×1 IMPLANT
BENZOIN TINCTURE PRP APPL 2/3 (GAUZE/BANDAGES/DRESSINGS) ×1 IMPLANT
CANISTER SUCT 3000ML PPV (MISCELLANEOUS) ×1 IMPLANT
CANNULA VESSEL 3MM 2 BLNT TIP (CANNULA) ×1 IMPLANT
CHLORAPREP W/TINT 26 (MISCELLANEOUS) ×1 IMPLANT
CLIP LIGATING EXTRA MED SLVR (CLIP) ×1 IMPLANT
CLIP LIGATING EXTRA SM BLUE (MISCELLANEOUS) ×1 IMPLANT
DRSG TEGADERM 4X4.5 CHG (GAUZE/BANDAGES/DRESSINGS) IMPLANT
ELECT REM PT RETURN 9FT ADLT (ELECTROSURGICAL) ×1
ELECTRODE REM PT RTRN 9FT ADLT (ELECTROSURGICAL) ×1 IMPLANT
GAUZE SPONGE 4X4 12PLY STRL LF (GAUZE/BANDAGES/DRESSINGS) IMPLANT
GLOVE BIO SURGEON STRL SZ8 (GLOVE) ×1 IMPLANT
GOWN STRL REUS W/ TWL LRG LVL3 (GOWN DISPOSABLE) ×2 IMPLANT
GOWN STRL REUS W/ TWL XL LVL3 (GOWN DISPOSABLE) ×1 IMPLANT
GOWN STRL REUS W/TWL LRG LVL3 (GOWN DISPOSABLE) ×1
GOWN STRL REUS W/TWL XL LVL3 (GOWN DISPOSABLE) ×2
GRAFT GORETEX STRT 4-7X45 (Vascular Products) IMPLANT
HEMOSTAT SNOW SURGICEL 2X4 (HEMOSTASIS) IMPLANT
KIT BASIN OR (CUSTOM PROCEDURE TRAY) ×1 IMPLANT
KIT TURNOVER KIT B (KITS) ×1 IMPLANT
NS IRRIG 1000ML POUR BTL (IV SOLUTION) ×1 IMPLANT
PACK CV ACCESS (CUSTOM PROCEDURE TRAY) ×1 IMPLANT
PAD ARMBOARD 7.5X6 YLW CONV (MISCELLANEOUS) ×2 IMPLANT
SHEATH PROBE COVER 6X72 (BAG) ×1 IMPLANT
SLING ARM FOAM STRAP MED (SOFTGOODS) IMPLANT
STRIP CLOSURE SKIN 1/2X4 (GAUZE/BANDAGES/DRESSINGS) ×1 IMPLANT
SUT GORETEX 6.0 TT9 (SUTURE) ×1 IMPLANT
SUT MNCRL AB 4-0 PS2 18 (SUTURE) IMPLANT
SUT PROLENE 6 0 BV (SUTURE) IMPLANT
SUT VIC AB 3-0 SH 27 (SUTURE) ×2
SUT VIC AB 3-0 SH 27X BRD (SUTURE) ×2 IMPLANT
TOWEL GREEN STERILE (TOWEL DISPOSABLE) ×1 IMPLANT
TUBE CONNECTING 20X1/4 (TUBING) IMPLANT
UNDERPAD 30X36 HEAVY ABSORB (UNDERPADS AND DIAPERS) ×1 IMPLANT
WATER STERILE IRR 1000ML POUR (IV SOLUTION) ×1 IMPLANT

## 2022-10-20 NOTE — Transfer of Care (Signed)
Immediate Anesthesia Transfer of Care Note  Patient: Madeline Brown  Procedure(s) Performed: INSERTION OF RIGHT ARM ARTERIOVENOUS (AV) GORE-TEX GRAFT (Right)  Patient Location: PACU  Anesthesia Type:MAC and Regional  Level of Consciousness: awake, alert , and oriented  Airway & Oxygen Therapy: Patient Spontanous Breathing and Patient connected to nasal cannula oxygen  Post-op Assessment: Report given to RN, Post -op Vital signs reviewed and stable, and Patient moving all extremities X 4  Post vital signs: Reviewed and stable  Last Vitals:  Vitals Value Taken Time  BP 156/103 10/20/22 0915  Temp    Pulse 72 10/20/22 0918  Resp 20 10/20/22 0918  SpO2 87 % 10/20/22 0918  Vitals shown include unvalidated device data.  Last Pain:  Vitals:   10/20/22 0981  TempSrc:   PainSc: 0-No pain         Complications: No notable events documented.

## 2022-10-20 NOTE — Anesthesia Procedure Notes (Signed)
Procedure Name: MAC Date/Time: 10/20/2022 7:35 AM  Performed by: Nils Pyle, CRNAPre-anesthesia Checklist: Patient identified, Emergency Drugs available, Suction available and Patient being monitored Patient Re-evaluated:Patient Re-evaluated prior to induction Oxygen Delivery Method: Simple face mask Preoxygenation: Pre-oxygenation with 100% oxygen Induction Type: IV induction Placement Confirmation: positive ETCO2 and breath sounds checked- equal and bilateral Dental Injury: Teeth and Oropharynx as per pre-operative assessment

## 2022-10-20 NOTE — Anesthesia Procedure Notes (Signed)
Anesthesia Procedure Image    

## 2022-10-20 NOTE — Interval H&P Note (Signed)
History and Physical Interval Note:  10/20/2022 7:18 AM  Madeline Brown  has presented today for surgery, with the diagnosis of ESRD.  The various methods of treatment have been discussed with the patient and family. After consideration of risks, benefits and other options for treatment, the patient has consented to  Procedure(s): INSERTION OF RIGHT ARM ARTERIOVENOUS (AV) GORE-TEX GRAFT (Right) as a surgical intervention.  The patient's history has been reviewed, patient examined, no change in status, stable for surgery.  I have reviewed the patient's chart and labs.  Questions were answered to the patient's satisfaction.     Leonie Douglas

## 2022-10-20 NOTE — Discharge Instructions (Signed)
[facility]  Discharge Instructions  AV Fistula or Graft Surgery for Dialysis Access  Please refer to the following instructions for your post-procedure care. Your surgeon or physician assistant will discuss any changes with you.  Activity  You may drive the day following your surgery, if you are comfortable and no longer taking prescription pain medication. Resume full activity as the soreness in your incision resolves.  Bathing/Showering  You may shower after you go home. Keep your incision dry for 48 hours. Do not soak in a bathtub, hot tub, or swim until the incision heals completely. You may not shower if you have a hemodialysis catheter.  Incision Care  Clean your incision with mild soap and water after 48 hours. Pat the area dry with a clean towel. You do not need a bandage unless otherwise instructed. Do not apply any ointments or creams to your incision. You may have skin glue on your incision. Do not peel it off. It will come off on its own in about one week. Your arm may swell a bit after surgery. To reduce swelling use pillows to elevate your arm so it is above your heart. Your doctor will tell you if you need to lightly wrap your arm with an ACE bandage.  Diet  Resume your normal diet. There are not special food restrictions following this procedure. In order to heal from your surgery, it is CRITICAL to get adequate nutrition. Your body requires vitamins, minerals, and protein. Vegetables are the best source of vitamins and minerals. Vegetables also provide the perfect balance of protein. Processed food has little nutritional value, so try to avoid this.  Medications  Resume taking all of your medications. If your incision is causing pain, you may take over-the counter pain relievers such as acetaminophen (Tylenol). If you were prescribed a stronger pain medication, please be aware these medications can cause nausea and constipation. Prevent  nausea by taking the medication with a snack or meal. Avoid constipation by drinking plenty of fluids and eating foods with high amount of fiber, such as fruits, vegetables, and grains.  Do not take Tylenol if you are taking prescription pain medications.  Follow up Your surgeon may want to see you in the office following your access surgery. If so, this will be arranged at the time of your surgery.  Please call us immediately for any of the following conditions:  Increased pain, redness, drainage (pus) from your incision site Fever of 101 degrees or higher Severe or worsening pain at your incision site Hand pain or numbness.  Reduce your risk of vascular disease:  Stop smoking. If you would like help, call [facility] at 1-800-QUIT-NOW ((470) 468-4229) or [place] at (938)681-2676  Manage your cholesterol Maintain a desired weight Control your diabetes Keep your blood pressure down  Dialysis  It will take several weeks to several months for your new dialysis access to be ready for use. Your surgeon will determine when it is okay to use it. Your nephrologist will continue to direct your dialysis. You can continue to use your Permcath until your new access is ready for use.   10/20/2022 Westley Foots [id number] 1975/10/02  Surgeon(s): Leonie Douglas, MD  Procedure(s): INSERTION OF RIGHT ARM ARTERIOVENOUS (AV) GORE-TEX GRAFT   May stick graft immediately   May stick graft on designated area only:   x Do not stick graft for 4 weeks    If you have any questions, please call the office at  [phone].  

## 2022-10-20 NOTE — Op Note (Signed)
DATE OF SERVICE: 10/20/2022  PATIENT:  Madeline Brown  47 y.o. female  PRE-OPERATIVE DIAGNOSIS:  ESRD  POST-OPERATIVE DIAGNOSIS:  Same  PROCEDURE:   Right arm arteriovenous graft  SURGEON:  Surgeon(s) and Role:    * Leonie Douglas, MD - Primary  ASSISTANT: Loel Dubonnet, PA-C  An experienced assistant was required given the complexity of this procedure and the standard of surgical care. My assistant helped with exposure through counter tension, suctioning, ligation and retraction to better visualize the surgical field.  My assistant expedited sewing during the case by following my sutures. Wherever I use the term "we" in the report, my assistant actively helped me with that portion of the procedure.  ANESTHESIA:   local, regional, and MAC  EBL: minimal  BLOOD ADMINISTERED:none  DRAINS: none   LOCAL MEDICATIONS USED:  LIDOCAINE   SPECIMEN:  none  COUNTS: confirmed correct.  TOURNIQUET:  none  PATIENT DISPOSITION:  PACU - hemodynamically stable.   Delay start of Pharmacological VTE agent (>24hrs) due to surgical blood loss or risk of bleeding: no  INDICATION FOR PROCEDURE: Madeline Brown is a 47 y.o. female with ESRD in need of permanent dialysis access. After careful discussion of risks, benefits, and alternatives the patient was offered right arm arteriovenous graft creation. The patient understood and wished to proceed.  OPERATIVE FINDINGS: healthy brachial artery and vein. Anastomosis performed end-to-end at venous anastomosis. Successful AVG. Good doppler flow at completion in the radial artery and outflow vein.   DESCRIPTION OF PROCEDURE: After identification of the patient in the pre-operative holding area, the patient was transferred to the operating room. The patient was positioned supine on the operating room table. Anesthesia was induced. The right arm was prepped and draped in standard fashion. A surgical pause was performed confirming correct patient, procedure,  and operative location.  The right brachial artery was exposed using a longitudinal incision in the distal arm just above the antecubital fossa.  Incision was carried down through subcutaneous tissue until the brachial sheath was encountered.  This was incised sharply.  The brachial artery was exposed and encircled with Silastic Vesseloops proximally and distally to the site of planned inflow.   The right brachial vein at the axilla was exposed using longitudinal incision just below the hairbearing area of the axilla.  Incision was carried down until the brachial sheath was encountered.  The brachial vein was identified, exposed, encircled with Silastic Vesseloops.   Using a curved, sheathed tunneling device, a 4-7 mm tapered Gore-Tex graft was tunneled subcutaneously and gentle arc across the biceps of the right arm.  Patient was then heparinized with 5000 units of IV heparin.   The brachial artery was clamped proximally and distally.  An anterior arteriotomy was made with an 11 blade.  This was extended with Potts scissors.  The 4 mm end of the Gore-Tex graft was spatulated and then anastomosed end-to-side to the brachial arteriotomy using continuous running suture of 6-0 Prolene.  The anastomosis was completed and hemostasis ensured.  The graft was clamped to restore perfusion to the hand.   The brachial vein was clamped proximally and distally.  An anterior venotomy was made with an 11 blade. Unfortunately, this caused a posterior wall venotomy as well. I elected to perform an end-to-end anastomosis rather than repair the thin brachial vein. The vein was transected. The peripheral end was ligated with 2-O silk. The 7 mm end of the Gore-Tex graft was then anastomosed end to end to the brachial vein  venotomy using continuous running suture of 6-0 Prolene.  Immediately prior to completion the anastomosis was de-aired and flushed.  Anastomosis was then completed.  Hemostasis was insured.   Doppler  machine was brought onto the field to interrogate the graft.  Doppler flow was noted in the radial artery.  About the arterial anastomosis flow was noted proximal and distal to the arterial anastomosis.  Distal to the venous anastomosis a Doppler bruit was heard.  Satisfied we ended the case here.   Surgical beds were irrigated copiously.  Hemostasis was again ensured in the surgical beds.  The wounds were closed in layers using 3-0 Vicryl and 4-0 Monocryl.  Clean bandages were applied.  Upon completion of the case instrument and sharps counts were confirmed correct. The patient was transferred to the PACU in good condition. I was present for all portions of the procedure.  FOLLOW UP PLAN: Assuming a normal postoperative course, the patient may begin using the graft in 4 weeks. Once the graft is reliably used for hemodialysis, her tunneled dialysis catheter can be removed. She can follow up with [place] on an as needed basis for issues.   Rande Brunt. Lenell Antu, MD [facility] Phone Number: [phone] 10/20/2022 8:59 AM

## 2022-10-20 NOTE — Anesthesia Preprocedure Evaluation (Signed)
Anesthesia Evaluation  Patient identified by MRN, date of birth, ID band Patient awake    Reviewed: Allergy & Precautions, H&P , NPO status , Patient's Chart, lab work & pertinent test results  Airway Mallampati: II  TM Distance: >3 FB Neck ROM: Full    Dental no notable dental hx.    Pulmonary neg pulmonary ROS, Current Smoker   Pulmonary exam normal breath sounds clear to auscultation       Cardiovascular hypertension, Normal cardiovascular exam Rhythm:Regular Rate:Normal  Echo 02/04/22: IMPRESSIONS   1. Left ventricular ejection fraction, by estimation, is 60 to 65%. The  left ventricle has normal function. The left ventricle has no regional  wall motion abnormalities. There is severe left ventricular hypertrophy.  Left ventricular diastolic parameters   are indeterminate.   2. Right ventricular systolic function is normal. The right ventricular  size is normal. Tricuspid regurgitation signal is inadequate for assessing  PA pressure.   3. Left atrial size was moderately dilated.   4. Right atrial size was moderately dilated.   5. The mitral valve is normal in structure. No evidence of mitral valve  regurgitation.   6. The aortic valve was not well visualized. Aortic valve regurgitation  is not visualized.   7. The inferior vena cava is normal in size with greater than 50%  respiratory variability, suggesting right atrial pressure of 3 mmHg.  - Comparison(s): No significant change from prior study.     Neuro/Psych Seizures -, Well Controlled,  CVA  negative psych ROS   GI/Hepatic negative GI ROS, Neg liver ROS,,,  Endo/Other  negative endocrine ROS    Renal/GU DialysisRenal disease  negative genitourinary   Musculoskeletal negative musculoskeletal ROS (+)    Abdominal   Peds negative pediatric ROS (+)  Hematology  (+) Blood dyscrasia, anemia   Anesthesia Other Findings   Reproductive/Obstetrics negative  OB ROS                             Anesthesia Physical Anesthesia Plan  ASA: 3  Anesthesia Plan: MAC   Post-op Pain Management: Regional block*   Induction: Intravenous  PONV Risk Score and Plan: 2 and Propofol infusion and Treatment may vary due to age or medical condition  Airway Management Planned: Simple Face Mask  Additional Equipment:   Intra-op Plan:   Post-operative Plan:   Informed Consent: I have reviewed the patients History and Physical, chart, labs and discussed the procedure including the risks, benefits and alternatives for the proposed anesthesia with the patient or authorized representative who has indicated his/her understanding and acceptance.     Dental advisory given  Plan Discussed with: CRNA and Surgeon  Anesthesia Plan Comments:        Anesthesia Quick Evaluation

## 2022-10-20 NOTE — Anesthesia Procedure Notes (Signed)
Anesthesia Regional Block: Supraclavicular block   Pre-Anesthetic Checklist: , timeout performed,  Correct Patient, Correct Site, Correct Laterality,  Correct Procedure, Correct Position, site marked,  Risks and benefits discussed,  Surgical consent,  Pre-op evaluation,  At surgeon's request and post-op pain management  Laterality: Right  Prep: chloraprep       Needles:  Injection technique: Single-shot  Needle Type: Echogenic Needle     Needle Length: 9cm      Additional Needles:   Procedures:,,,, ultrasound used (permanent image in chart),,    Narrative:  Start time: 10/20/2022 7:07 AM End time: 10/20/2022 7:14 AM Injection made incrementally with aspirations every 5 mL.  Performed by: Personally  Anesthesiologist: Eilene Ghazi, MD  Additional Notes: Patient tolerated the procedure well without complications

## 2022-10-21 ENCOUNTER — Encounter (HOSPITAL_COMMUNITY): Payer: Self-pay | Admitting: Vascular Surgery

## 2022-10-21 NOTE — Anesthesia Postprocedure Evaluation (Signed)
Anesthesia Post Note  Patient: Madeline Brown  Procedure(s) Performed: INSERTION OF RIGHT ARM ARTERIOVENOUS (AV) GORE-TEX GRAFT (Right)     Patient location during evaluation: PACU Anesthesia Type: MAC Level of consciousness: awake and alert Pain management: pain level controlled Vital Signs Assessment: post-procedure vital signs reviewed and stable Respiratory status: spontaneous breathing, nonlabored ventilation, respiratory function stable and patient connected to nasal cannula oxygen Cardiovascular status: stable and blood pressure returned to baseline Postop Assessment: no apparent nausea or vomiting Anesthetic complications: no  No notable events documented.  Last Vitals:  Vitals:   10/20/22 0945 10/20/22 1000  BP: (!) 151/96 (!) 153/99  Pulse: 70 74  Resp: 11 10  Temp:  36.7 C  SpO2: 93% 93%    Last Pain:  Vitals:   10/20/22 0953  TempSrc:   PainSc: 4                  Hanaan Gancarz S

## 2023-01-27 ENCOUNTER — Other Ambulatory Visit: Payer: Self-pay | Admitting: Family

## 2023-01-27 DIAGNOSIS — Z1231 Encounter for screening mammogram for malignant neoplasm of breast: Secondary | ICD-10-CM

## 2023-03-23 ENCOUNTER — Ambulatory Visit: Payer: Medicare HMO

## 2023-05-08 ENCOUNTER — Encounter: Payer: Self-pay | Admitting: Podiatry

## 2023-05-08 ENCOUNTER — Ambulatory Visit (INDEPENDENT_AMBULATORY_CARE_PROVIDER_SITE_OTHER): Payer: Medicare HMO | Admitting: Podiatry

## 2023-05-08 DIAGNOSIS — B351 Tinea unguium: Secondary | ICD-10-CM

## 2023-05-08 DIAGNOSIS — M2041 Other hammer toe(s) (acquired), right foot: Secondary | ICD-10-CM

## 2023-05-08 DIAGNOSIS — D492 Neoplasm of unspecified behavior of bone, soft tissue, and skin: Secondary | ICD-10-CM | POA: Diagnosis not present

## 2023-05-08 DIAGNOSIS — D2371 Other benign neoplasm of skin of right lower limb, including hip: Secondary | ICD-10-CM | POA: Diagnosis not present

## 2023-05-08 DIAGNOSIS — M2042 Other hammer toe(s) (acquired), left foot: Secondary | ICD-10-CM

## 2023-05-11 NOTE — Progress Notes (Signed)
Chief Complaint  Patient presents with   Callouses    Patient states she has callouses and it has been going on for a month now, pain level is about a 9 patient states , patient takes tylenol for pain . Patient will like toe nails cut , patient states her r foot 5th toe is discolored and the corner of the r foot hallux.  Patient states she has a corn that comes and goes on her 5th toe on her l foot     HPI: 47 y.o. female presents today with primary complaint of painful skin lesion to the right plantar forefoot.  Is very painful for the patient to weight-bear due to this.  She has had to limit her activity for this, she has tried taking Tylenol for pain.  He also complains of callus tissue to the left fifth toe associated with contracture of the toe that is painful with certain shoes.  She is also noticed some thickening of the first and fifth toenails bilaterally with the nails becoming discolored and becoming painful in shoes with increased thickness.  Patient denies nausea, vomiting, fever, chills, chest pain, shortness of breath.  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   History of blood transfusion 07/2020   Hypertension    Intracranial hemorrhage (HCC)    LVH (left ventricular hypertrophy)    Pericardial effusion    Pneumonia    Pulmonary hypertension (HCC)    Seizure (HCC) 02/2019   last one was in 02/2019   Shortness of breath 07/15/2018   on 3L via [place] prn   Stroke (HCC)    Mild was told after having a seizure   Substance abuse (HCC)    Marijuana   Thrombocytopenia (HCC)     Past Surgical History:  Procedure Laterality Date   AV FISTULA PLACEMENT Left 06/24/2017   Procedure: CREATION OF LEFT ARM BRACHIALCEPHALIC  ARTERIOVENOUS (AV) FISTULA;  Surgeon: Larina Earthly, MD;  Location: [facility];  Service: Vascular;  Laterality: Left;   AV FISTULA PLACEMENT Right 10/20/2022   Procedure:  INSERTION OF RIGHT ARM ARTERIOVENOUS (AV) GORE-TEX GRAFT;  Surgeon: Leonie Douglas, MD;  Location: [facility] OR;  Service: Vascular;  Laterality: Right;   CARDIAC CATHETERIZATION     CESAREAN SECTION     x2   COMPLEX WOUND CLOSURE Left 07/30/2020   Procedure: COMPLEX WOUND CLOSURE;  Surgeon: Leonie Douglas, MD;  Location: [facility];  Service: Vascular;  Laterality: Left;   FISTULA SUPERFICIALIZATION Left 10/28/2017   Procedure: SUPERFICIALIZATION LEFT BRACHIOCEPHALIC ARTERIOVENOUS FISTULA;  Surgeon: Larina Earthly, MD;  Location: [facility];  Service: Vascular;  Laterality: Left;   INGUINAL HERNIA REPAIR Bilateral 02/01/2021   Procedure: LAPAROSCOPIC BILATERAL INGUINAL HERNIA REPAIR WITH MESH;  Surgeon: Kinsinger, De Blanch, MD;  Location: [facility];  Service: General;  Laterality: Bilateral;   INSERTION OF DIALYSIS CATHETER Right 06/24/2017   Procedure: INSERTION OF TUNNELED DIALYSIS CATHETER;  Surgeon: Larina Earthly, MD;  Location: [facility];  Service: Vascular;  Laterality: Right;   INSERTION OF DIALYSIS CATHETER Right 07/30/2020   Procedure: INSERTION OF DIALYSIS CATHETER;  Surgeon: Leonie Douglas, MD;  Location: [facility];  Service: Vascular;  Laterality: Right;   INSERTION OF DIALYSIS CATHETER Right 08/06/2022   Procedure: INSERTION OF TUNNELED DIALYSIS CATHETER;  Surgeon: Leonie Douglas, MD;  Location: [facility]  OR;  Service: Vascular;  Laterality: Right;   LIGATION OF COMPETING BRANCHES OF ARTERIOVENOUS FISTULA  10/28/2017   Procedure: LIGATION OF COMPETING BRANCHES OF ARTERIOVENOUS FISTULA x3;  Surgeon: Larina Earthly, MD;  Location: [facility];  Service: Vascular;;   REVISON OF ARTERIOVENOUS FISTULA Left 07/30/2020   Procedure: LEFT UPPER EXTREMITY ARTERIOVENOUS FISTULA REVISON;  Surgeon: Leonie Douglas, MD;  Location: [facility];  Service: Vascular;  Laterality: Left;  PERIPHERAL NERVE BLOCK   REVISON OF ARTERIOVENOUS FISTULA Left 08/06/2022   Procedure: LEFT UPPER EXTREMITY FISTULA LIGATION;  Surgeon: Leonie Douglas, MD;  Location: [facility];  Service: Vascular;  Laterality: Left;   RIGHT HEART CATH N/A 07/09/2022   Procedure: RIGHT HEART CATH;  Surgeon: Dorthula Nettles, DO;  Location: [facility];  Service: Cardiovascular;  Laterality: N/A;    Allergies  Allergen Reactions   Visine [Tetrahydrozoline Hcl] Swelling and Other (See Comments)    Eyes Swell    ROS negative except as stated in HPI   Physical Exam: There were no vitals filed for this visit.  General: The patient is alert and oriented x3 in no acute distress.  Dermatology: Skin is warm, dry and supple bilateral lower extremities. Interspaces are clear of maceration and debris.  Right plantar forefoot subsecond metatarsal there is significant hyperkeratotic lesion that is tender on direct palpation.  Mild hyperkeratotic tissue present to left lateral fifth toe region.  The bilateral first and fifth toenails are thickened, dystrophic, discolored in appearance suggestive of possible mycotic infection  Vascular: Palpable pedal pulses bilaterally. Capillary refill within normal limits.  No appreciable edema.  No erythema or calor.  Neurological: Light touch sensation grossly intact bilateral feet.   Musculoskeletal Exam: Muscle strength 5/5 to all pedal groups.  Reducible hammertoe contractures of the lesser toes bilaterally most notably the fourth and fifth toes.   Assessment/Plan of Care: 1. Skin neoplasm   2. Onychomycosis   3. Hammertoe, bilateral      No orders of the defined types were placed in this encounter.  None  Discussed clinical findings with patient today.  Plan: -Plantar skin neoplasm right subsecond metatarsal was debrided using dermal curette and #15 blade without incident.  Salinocaine ointment applied. -Metatarsal pad applied to the right foot to offload this area. -Advised patient to scrub callused areas with white vinegar prior to showering -Will continue to monitor the left fifth toe, hammertoe  contracture is likely exacerbating this callused area with certain shoe gear, discussed with patient. -Affected toenails x 4 were debrided in thickness and length using aseptic nail nippers without incident. -Patient may follow-up as needed if symptoms recur or worsen.    Mylah Baynes L. Marchia Bond, AACFAS [facility]     [address] [place], [place] [address]                Office [phone]  Fax [phone]

## 2023-05-12 ENCOUNTER — Ambulatory Visit: Payer: Medicare HMO | Attending: Cardiology | Admitting: Cardiology

## 2023-06-30 ENCOUNTER — Emergency Department (HOSPITAL_COMMUNITY)
Admission: EM | Admit: 2023-06-30 | Discharge: 2023-07-01 | Disposition: A | Discharge status: Home / Self Care | Payer: Medicare HMO | Attending: Emergency Medicine | Admitting: Emergency Medicine

## 2023-06-30 ENCOUNTER — Emergency Department (HOSPITAL_BASED_OUTPATIENT_CLINIC_OR_DEPARTMENT_OTHER): Payer: Medicare HMO

## 2023-06-30 ENCOUNTER — Emergency Department (HOSPITAL_COMMUNITY): Payer: Medicare HMO

## 2023-06-30 ENCOUNTER — Encounter (HOSPITAL_COMMUNITY): Payer: Self-pay

## 2023-06-30 DIAGNOSIS — M79601 Pain in right arm: Secondary | ICD-10-CM

## 2023-06-30 DIAGNOSIS — N186 End stage renal disease: Secondary | ICD-10-CM

## 2023-06-30 DIAGNOSIS — X58XXXA Exposure to other specified factors, initial encounter: Secondary | ICD-10-CM | POA: Diagnosis not present

## 2023-06-30 DIAGNOSIS — Z7982 Long term (current) use of aspirin: Secondary | ICD-10-CM | POA: Insufficient documentation

## 2023-06-30 DIAGNOSIS — I120 Hypertensive chronic kidney disease with stage 5 chronic kidney disease or end stage renal disease: Secondary | ICD-10-CM | POA: Diagnosis not present

## 2023-06-30 DIAGNOSIS — R2230 Localized swelling, mass and lump, unspecified upper limb: Secondary | ICD-10-CM | POA: Diagnosis present

## 2023-06-30 DIAGNOSIS — N185 Chronic kidney disease, stage 5: Secondary | ICD-10-CM | POA: Diagnosis not present

## 2023-06-30 DIAGNOSIS — T148XXA Other injury of unspecified body region, initial encounter: Secondary | ICD-10-CM

## 2023-06-30 DIAGNOSIS — S40021A Contusion of right upper arm, initial encounter: Secondary | ICD-10-CM | POA: Diagnosis not present

## 2023-06-30 DIAGNOSIS — I10 Essential (primary) hypertension: Secondary | ICD-10-CM

## 2023-06-30 DIAGNOSIS — Z79899 Other long term (current) drug therapy: Secondary | ICD-10-CM | POA: Insufficient documentation

## 2023-06-30 LAB — COMPREHENSIVE METABOLIC PANEL
ALT: 10 U/L (ref 0–44)
AST: 16 U/L (ref 15–41)
Albumin: 3.5 g/dL (ref 3.5–5.0)
Alkaline Phosphatase: 35 U/L — ABNORMAL LOW (ref 38–126)
Anion gap: 16 — ABNORMAL HIGH (ref 5–15)
BUN: 89 mg/dL — ABNORMAL HIGH (ref 6–20)
CO2: 20 mmol/L — ABNORMAL LOW (ref 22–32)
Calcium: 8.8 mg/dL — ABNORMAL LOW (ref 8.9–10.3)
Chloride: 99 mmol/L (ref 98–111)
Creatinine, Ser: 11.87 mg/dL — ABNORMAL HIGH (ref 0.44–1.00)
GFR, Estimated: 4 mL/min — ABNORMAL LOW (ref >60)
Glucose, Bld: 98 mg/dL (ref 70–99)
Potassium: 5.9 mmol/L — ABNORMAL HIGH (ref 3.5–5.1)
Sodium: 135 mmol/L (ref 135–145)
Total Bilirubin: 0.8 mg/dL (ref 0.0–1.2)
Total Protein: 7.1 g/dL (ref 6.5–8.1)

## 2023-06-30 LAB — CBC WITH DIFFERENTIAL/PLATELET
Abs Immature Granulocytes: 0.01 10*3/uL (ref 0.00–0.07)
Basophils Absolute: 0 10*3/uL (ref 0.0–0.1)
Basophils Relative: 1 %
Eosinophils Absolute: 0.2 10*3/uL (ref 0.0–0.5)
Eosinophils Relative: 4 %
HCT: 31.5 % — ABNORMAL LOW (ref 36.0–46.0)
Hemoglobin: 10.3 g/dL — ABNORMAL LOW (ref 12.0–15.0)
Immature Granulocytes: 0 %
Lymphocytes Relative: 11 %
Lymphs Abs: 0.6 10*3/uL — ABNORMAL LOW (ref 0.7–4.0)
MCH: 35.2 pg — ABNORMAL HIGH (ref 26.0–34.0)
MCHC: 32.7 g/dL (ref 30.0–36.0)
MCV: 107.5 fL — ABNORMAL HIGH (ref 80.0–100.0)
Monocytes Absolute: 0.3 10*3/uL (ref 0.1–1.0)
Monocytes Relative: 6 %
Neutro Abs: 4.3 10*3/uL (ref 1.7–7.7)
Neutrophils Relative %: 78 %
Platelets: 107 10*3/uL — ABNORMAL LOW (ref 150–400)
RBC: 2.93 MIL/uL — ABNORMAL LOW (ref 3.87–5.11)
RDW: 15.5 % (ref 11.5–15.5)
WBC: 5.5 10*3/uL (ref 4.0–10.5)
nRBC: 0 % (ref 0.0–0.2)

## 2023-06-30 MED ORDER — OXYCODONE-ACETAMINOPHEN 5-325 MG PO TABS
1.0000 | ORAL_TABLET | Freq: Once | ORAL | Status: AC
  Administered 2023-06-30: 1 via ORAL
  Filled 2023-06-30: qty 1

## 2023-06-30 MED ORDER — ACETAMINOPHEN 500 MG PO TABS
1000.0000 mg | ORAL_TABLET | Freq: Once | ORAL | Status: AC
  Administered 2023-06-30: 1000 mg via ORAL
  Filled 2023-06-30: qty 2

## 2023-06-30 NOTE — ED Notes (Signed)
 Pt transported to xray

## 2023-06-30 NOTE — ED Provider Triage Note (Signed)
 Emergency Medicine Provider Triage Evaluation Note  Madeline Brown , a 48 y.o. female  was evaluated in triage.  Pt complains of pain at her dialysis fistula site.  Patient says that her dialysis fistula infiltrated on 1/9.  She has been going to dialysis since without difficulty with access.  She has pain in her arm..  Review of Systems    Physical Exam  BP (!) 156/112   Pulse 86   Temp 98.7 F (37.1 C) (Oral)   Resp 19   Ht 5' (1.524 m)   Wt 55.9 kg   LMP 12/14/2017   SpO2 97%   BMI 24.07 kg/m  Gen:   Awake, no distress   Resp:  Normal effort  MSK:   Moves extremities without difficulty  Other:  Bruising over the right biceps.  Good thrill appreciated at AV fistula.  No numbness, tingling in the hand or arms.  Medical Decision Making  Medically screening exam initiated at 8:53 AM.  Appropriate orders placed.  Adison Uemura was informed that the remainder of the evaluation will be completed by another provider, this initial triage assessment does not replace that evaluation, and the importance of remaining in the ED until their evaluation is complete.  Bruising around AV fistula.  Good thrill.  No difficulty with access.  Ultrasound imaging ordered.   Mannie Pac T, DO 06/30/23 [phone]

## 2023-06-30 NOTE — ED Triage Notes (Signed)
 Pt BIB [facility] from dialysis center. Last dialysis was Saturday. Pt reports her fistula infiltrated and pain and swelling has gotten worse.

## 2023-06-30 NOTE — Progress Notes (Signed)
 RUE duplex dialysis access (AVG) has been completed.  Preliminary results given to Dr. Andria Meuse.    Results can be found under chart review under CV PROC. 06/30/2023 2:55 PM Baden Betsch RVT, RDMS

## 2023-06-30 NOTE — ED Notes (Signed)
 Pt c/o new R arm numbness  and weakness after x ray; Dr. Andria Meuse notified; pt brought back to triage to be reevaluated

## 2023-07-01 DIAGNOSIS — S40021A Contusion of right upper arm, initial encounter: Secondary | ICD-10-CM | POA: Diagnosis not present

## 2023-07-01 MED ORDER — OXYCODONE-ACETAMINOPHEN 5-325 MG PO TABS: 1.0000 | ORAL_TABLET | Freq: Four times a day (QID) | ORAL | 0 refills | Status: DC | PRN

## 2023-07-01 MED ORDER — ACETAMINOPHEN 325 MG PO TABS: 975.0000 mg | ORAL_TABLET | Freq: Once | ORAL | Status: DC

## 2023-07-01 MED ORDER — AMLODIPINE BESYLATE 5 MG PO TABS
10.0000 mg | ORAL_TABLET | Freq: Once | ORAL | Status: AC
  Administered 2023-07-01: 10 mg via ORAL
  Filled 2023-07-01: qty 2

## 2023-07-01 MED ORDER — SODIUM ZIRCONIUM CYCLOSILICATE 10 G PO PACK
10.0000 g | PACK | Freq: Once | ORAL | Status: AC
  Administered 2023-07-01: 10 g via ORAL
  Filled 2023-07-01: qty 1

## 2023-07-01 MED ORDER — CARVEDILOL 12.5 MG PO TABS
25.0000 mg | ORAL_TABLET | Freq: Once | ORAL | Status: AC
  Administered 2023-07-01: 25 mg via ORAL
  Filled 2023-07-01: qty 2

## 2023-07-01 MED ORDER — OXYCODONE-ACETAMINOPHEN 5-325 MG PO TABS
1.0000 | ORAL_TABLET | Freq: Once | ORAL | Status: AC
  Administered 2023-07-01: 1 via ORAL
  Filled 2023-07-01: qty 1

## 2023-07-01 NOTE — Discharge Instructions (Addendum)
 Continue your current medications.  Follow-up for dialysis as planned with Dr. Jearldine Mina

## 2023-07-01 NOTE — ED Provider Notes (Signed)
 [facility] Provider Note   CSN: [id number] Arrival date & time: 06/30/23  0744     History  Chief Complaint  Patient presents with   Vascular Access Problem    Madeline Brown is a 48 y.o. female.  HPI   Patient presented to the ED for evaluation of arm swelling.  Patient has a history of seizure disorder hypertensive urgency, chronic kidney disease on dialysis, pancytopenia, CHF who presents to the ED with complaints of arm swelling.  Patient states she was receiving dialysis on Saturday.  She feels like the needle may have displaced at 1 point during the dialysis.  She felt that it jabbed her.  Patient started developing swelling in her arm.  She was supposed to go to dialysis on Tuesday but with the increased swelling she came to the ED before her dialysis.  Patient denies any trouble with shortness of breath.  No numbness or weakness.  Home Medications Prior to Admission medications   Medication Sig Start Date End Date Taking? Authorizing Provider  oxyCODONE -acetaminophen  (PERCOCET/ROXICET) 5-325 MG tablet Take 1 tablet by mouth every 6 (six) hours as needed for severe pain (pain score 7-10). 07/01/23  Yes Trish Furl, MD  acetaminophen  (TYLENOL ) 500 MG tablet Take 500-1,000 mg by mouth every 6 (six) hours as needed for mild pain or headache.    [provider]  amLODipine  (NORVASC ) 10 MG tablet Take 1 tablet (10 mg total) by mouth at bedtime. 12/09/19 06/08/23  Aslam, Sadia, MD  aspirin  EC 81 MG tablet Take 81 mg by mouth daily as needed (chest pain). Swallow whole.    [provider]  carvedilol  (COREG ) 25 MG tablet Take 1 tablet (25 mg total) by mouth 2 (two) times daily with a meal. Take 25 mg by mouth at 6:30 AM and 6:00 PM on Sun/Mon/Wed/Fri and 25 mg at 11:00 AM and 8:00 PM on Tues/Thurs/Sat 08/06/22 10/20/22  Bonnye Butts, PA-C  cinacalcet  (SENSIPAR ) 60 MG tablet Take 60 mg by mouth daily.    [provider]  Doxercalciferol  (HECTOROL  IV) Doxercalciferol  (Hectorol ) 09/09/22 09/08/23  [provider]  hydrALAZINE  (APRESOLINE ) 100 MG tablet Take 1 tablet (100 mg total) by mouth 3 (three) times daily. 12/09/19 01/30/23  Aslam, Sadia, MD  levETIRAcetam  (KEPPRA ) 1000 MG tablet Take 1,000 mg by mouth daily.    [provider]  multivitamin (RENA-VIT) TABS tablet Take 1 tablet by mouth daily.    [provider]  spironolactone  (ALDACTONE ) 50 MG tablet Take 1 tablet (50 mg total) by mouth daily. 12/09/19   Aslam, Sadia, MD  VELPHORO  500 MG chewable tablet Chew 500 mg by mouth 3 (three) times daily with meals. 09/30/22   [provider]      Allergies    Visine [tetrahydrozoline hcl]    Review of Systems   Review of Systems  Physical Exam Updated Vital Signs BP (!) 160/109 (BP Location: Left Arm)   Pulse 74   Temp 98.5 F (36.9 C) (Oral)   Resp 16   Ht 1.524 m (5')   Wt 55.9 kg   LMP 12/14/2017   SpO2 100%   BMI 24.07 kg/m  Physical Exam Vitals and nursing note reviewed.  Constitutional:      General: She is not in acute distress.    Appearance: She is well-developed.  HENT:     Head: Normocephalic and atraumatic.     Right Ear: External ear normal.  Left Ear: External ear normal.  Eyes:     General: No scleral icterus.       Right eye: No discharge.        Left eye: No discharge.     Conjunctiva/sclera: Conjunctivae normal.  Neck:     Trachea: No tracheal deviation.  Cardiovascular:     Rate and Rhythm: Normal rate.  Pulmonary:     Effort: Pulmonary effort is normal. No respiratory distress.     Breath sounds: No stridor.  Abdominal:     General: There is no distension.  Musculoskeletal:        General: Tenderness present. No swelling or deformity.     Cervical back: Neck supple.     Comments: Hematoma noted in the right upper arm, no surrounding erythema, no distal swelling  Skin:    General: Skin is warm and dry.     Findings: No  rash.  Neurological:     Mental Status: She is alert. Mental status is at baseline.     Cranial Nerves: No dysarthria or facial asymmetry.     Motor: No seizure activity.     ED Results / Procedures / Treatments   Labs (all labs ordered are listed, but only abnormal results are displayed) Labs Reviewed  COMPREHENSIVE METABOLIC PANEL - Abnormal; Notable for the following components:      Result Value   Potassium 5.9 (*)    CO2 20 (*)    BUN 89 (*)    Creatinine, Ser 11.87 (*)    Calcium  8.8 (*)    Alkaline Phosphatase 35 (*)    GFR, Estimated 4 (*)    Anion gap 16 (*)    All other components within normal limits  CBC WITH DIFFERENTIAL/PLATELET - Abnormal; Notable for the following components:   RBC 2.93 (*)    Hemoglobin 10.3 (*)    HCT 31.5 (*)    MCV 107.5 (*)    MCH 35.2 (*)    Platelets 107 (*)    Lymphs Abs 0.6 (*)    All other components within normal limits  RENAL FUNCTION PANEL    EKG None  Radiology VAS US  DUPLEX DIALYSIS ACCESS (AVF, AVG) Result Date: 06/30/2023 DIALYSIS ACCESS Patient Name:  Madeline Brown  Date of Exam:   06/30/2023 Medical Rec #: [id number]       Accession #:    [id number] Date of Birth: 1976/04/15        Patient Gender: F Patient Age:   4 years Exam Location:  [facility] Procedure:      VAS US  DUPLEX DIALYSIS ACCESS (AVF, AVG) Referring Phys: Afton Horse --------------------------------------------------------------------------------  Reason for Exam: Arm pain, recent infiltration during HD. Access Site: Right Upper Extremity. Access Type: Upper arm straight graft. Comparison Study: No previous Performing Technologist: Jody Hill RVT, RDMS  Examination Guidelines: A complete evaluation includes B-mode imaging, spectral Doppler, color Doppler, and power Doppler as needed of all accessible portions of each vessel. Unilateral testing is considered an integral part of a complete examination. Limited examinations for reoccurring indications may  be performed as noted.  Findings:   +--------------------+----------+-----------------+--------+ AVG                 PSV (cm/s)Flow Vol (mL/min)Describe +--------------------+----------+-----------------+--------+ Native artery inflow   239          2340                +--------------------+----------+-----------------+--------+ Arterial anastomosis   778                              +--------------------+----------+-----------------+--------+  Prox graft             624                              +--------------------+----------+-----------------+--------+ Mid graft              263                              +--------------------+----------+-----------------+--------+ Distal graft           265                              +--------------------+----------+-----------------+--------+ Venous anastomosis     509                              +--------------------+----------+-----------------+--------+ Venous outflow         213                              +--------------------+----------+-----------------+--------+ There is a focal collection seen in the proximal upper arm measuring 5.73 x 1.74 x 5.70 cm.  Summary: Patent arteriovenous graft, however volume flow appears elevated.  Large focal collection seen in proximal upper arm (5.73 x 1.74 x 5.70 cm).  *See table(s) above for measurements and observations.  Diagnosing physician: Delaney Fearing Electronically signed by Delaney Fearing on 06/30/2023 at 9:27:24 PM.    --------------------------------------------------------------------------------   Final    DG Chest 1 View Result Date: 06/30/2023 CLINICAL DATA:  Shortness of breath EXAM: CHEST  1 VIEW COMPARISON:  08/07/2022 FINDINGS: Previously seen central line has been removed. Stable cardiomegaly. Interstitial opacity at the right lung base. Left lung is clear. No pleural effusion or pneumothorax. IMPRESSION: Interstitial opacity at the right lung base, which may  represent atelectasis or pneumonia. Electronically Signed   By: Leverne Reading D.O.   On: 06/30/2023 09:20    Procedures Procedures    Medications Ordered in ED Medications  acetaminophen  (TYLENOL ) tablet 1,000 mg (1,000 mg Oral Given 06/30/23 0911)  oxyCODONE -acetaminophen  (PERCOCET/ROXICET) 5-325 MG per tablet 1 tablet (1 tablet Oral Given 06/30/23 1205)  oxyCODONE -acetaminophen  (PERCOCET/ROXICET) 5-325 MG per tablet 1 tablet (1 tablet Oral Given 07/01/23 1004)  sodium zirconium cyclosilicate  (LOKELMA ) packet 10 g (10 g Oral Given 07/01/23 1004)  amLODipine  (NORVASC ) tablet 10 mg (10 mg Oral Given 07/01/23 1102)  carvedilol  (COREG ) tablet 25 mg (25 mg Oral Given 07/01/23 1103)    ED Course/ Medical Decision Making/ A&P Clinical Course as of 07/01/23 1104  Wed Jul 01, 2023  0850 Laboratory test show findings consistent with chronic kidney disease. [JK]  0850 Anemia stable compared to previous findings. [JK]  [id number] Chest x-ray shows interstitial opacity at the lung base.  Could be atelectasis versus pneumonia [JK]  0852 Ultrasound report shows patent arteriovenous graft, flow volume is elevated, large focal collection in the proximal upper arm measuring 5 x 5 cm [JK]  0957 Discussed with Dr. Jearldine Mina.  He will come evaluate the patient.  Dose of Lokelma  ordered while in the ED [JK]    Clinical Course User Index [JK] Trish Furl, MD  Medical Decision Making Risk Prescription drug management.   Patient appears to have a hematoma in her right upper arm near her fistula.  Suspect this is related to her dialysis vascular access.  Patient's ultrasound shows patent graft.  It does show a fluid collection likely a hematoma based on her exam.  Her laboratory tests are notable for mild hyperkalemia.  Patient does not appear to require emergent dialysis at this time.  I consulted with Dr. Jearldine Mina and he will see the patient in the ED to arrange outpatient follow-up  management.  Pt seen by Dr Jearldine Mina.  Pt declined dialysis today and plans for dialysis as outpatient tomorrow        Final Clinical Impression(s) / ED Diagnoses Final diagnoses:  Hematoma  Stage 5 chronic kidney disease on chronic dialysis (HCC)  Hypertension, unspecified type    Rx / DC Orders ED Discharge Orders          Ordered    oxyCODONE -acetaminophen  (PERCOCET/ROXICET) 5-325 MG tablet  Every 6 hours PRN        07/01/23 1104              Trish Furl, MD 07/01/23 1104

## 2023-07-06 ENCOUNTER — Ambulatory Visit (HOSPITAL_COMMUNITY)
Admission: RE | Admit: 2023-07-06 | Discharge: 2023-07-06 | Disposition: A | Discharge status: Home / Self Care | Payer: Medicare HMO | Attending: Nephrology | Admitting: Nephrology

## 2023-07-06 ENCOUNTER — Other Ambulatory Visit: Payer: Self-pay

## 2023-07-06 ENCOUNTER — Encounter (HOSPITAL_COMMUNITY)
Admission: RE | Disposition: A | Discharge status: Home / Self Care | Payer: Self-pay | Source: Home / Self Care | Attending: Nephrology

## 2023-07-06 SURGERY — DIALYSIS/PERMA CATHETER INSERTION
Anesthesia: LOCAL

## 2023-07-06 MED ORDER — OXYCODONE HCL 5 MG PO TABS: 5.0000 mg | ORAL_TABLET | Freq: Four times a day (QID) | ORAL | 0 refills | Status: AC | PRN

## 2023-07-06 MED ORDER — LIDOCAINE HCL (PF) 1 % IJ SOLN
INTRAMUSCULAR | Status: AC
  Filled 2023-07-06: qty 30

## 2023-07-06 NOTE — H&P (Signed)
Madeline Brown is an 48 y.o. female.   Chief Complaint: Infiltration of right upper arm AV graft HPI:  48 year old woman with past medical history significant for hypertension, history of CVA, congestive heart failure with preserved ejection fraction and end-stage renal disease on hemodialysis.  She has a right upper arm AV graft that was placed on 10/20/2022 by Dr. Lenell Antu and unfortunately on 06/25/2023, she suffered an infiltration with needle dislodgment while vomiting on dialysis.  When seen last week, she was experiencing a lot of pain with swelling and a decision was made to schedule her for [employer] placement and rest the AV graft.  Of note, she underwent uneventful dialysis 2 days ago on 07/04/2023.  She reports improvement of the swelling and less pain/tenderness with some concerned that it seems to track under her right arm and into the right breast.  She denies any chest pain or shortness of breath.  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   History of blood transfusion 07/2020   Hypertension    Intracranial hemorrhage (HCC)    LVH (left ventricular hypertrophy)    Pericardial effusion    Pneumonia    Pulmonary hypertension (HCC)    Seizure (HCC) 02/2019   last one was in 02/2019   Shortness of breath 07/15/2018   on 3L via [place] prn   Stroke (HCC)    Mild was told after having a seizure   Substance abuse (HCC)    Marijuana   Thrombocytopenia (HCC)     Past Surgical History:  Procedure Laterality Date   AV FISTULA PLACEMENT Left 06/24/2017   Procedure: CREATION OF LEFT ARM BRACHIALCEPHALIC  ARTERIOVENOUS (AV) FISTULA;  Surgeon: Larina Earthly, MD;  Location: [facility];  Service: Vascular;  Laterality: Left;   AV FISTULA PLACEMENT Right 10/20/2022   Procedure: INSERTION OF RIGHT ARM ARTERIOVENOUS (AV) GORE-TEX GRAFT;  Surgeon: Leonie Douglas, MD;  Location: [facility];  Service: Vascular;  Laterality: Right;    CARDIAC CATHETERIZATION     CESAREAN SECTION     x2   COMPLEX WOUND CLOSURE Left 07/30/2020   Procedure: COMPLEX WOUND CLOSURE;  Surgeon: Leonie Douglas, MD;  Location: [facility];  Service: Vascular;  Laterality: Left;   FISTULA SUPERFICIALIZATION Left 10/28/2017   Procedure: SUPERFICIALIZATION LEFT BRACHIOCEPHALIC ARTERIOVENOUS FISTULA;  Surgeon: Larina Earthly, MD;  Location: [facility];  Service: Vascular;  Laterality: Left;   INGUINAL HERNIA REPAIR Bilateral 02/01/2021   Procedure: LAPAROSCOPIC BILATERAL INGUINAL HERNIA REPAIR WITH MESH;  Surgeon: Kinsinger, De Blanch, MD;  Location: [facility];  Service: General;  Laterality: Bilateral;   INSERTION OF DIALYSIS CATHETER Right 06/24/2017   Procedure: INSERTION OF TUNNELED DIALYSIS CATHETER;  Surgeon: Larina Earthly, MD;  Location: [facility];  Service: Vascular;  Laterality: Right;   INSERTION OF DIALYSIS CATHETER Right 07/30/2020   Procedure: INSERTION OF DIALYSIS CATHETER;  Surgeon: Leonie Douglas, MD;  Location: [facility];  Service: Vascular;  Laterality: Right;   INSERTION OF DIALYSIS CATHETER Right 08/06/2022   Procedure: INSERTION OF TUNNELED DIALYSIS CATHETER;  Surgeon: Leonie Douglas, MD;  Location: [facility];  Service: Vascular;  Laterality: Right;   LIGATION OF COMPETING BRANCHES OF ARTERIOVENOUS FISTULA  10/28/2017   Procedure: LIGATION OF COMPETING BRANCHES OF ARTERIOVENOUS FISTULA x3;  Surgeon: Larina Earthly, MD;  Location: [facility];  Service: Vascular;;   REVISON OF ARTERIOVENOUS FISTULA  Left 07/30/2020   Procedure: LEFT UPPER EXTREMITY ARTERIOVENOUS FISTULA REVISON;  Surgeon: Leonie Douglas, MD;  Location: [facility];  Service: Vascular;  Laterality: Left;  PERIPHERAL NERVE BLOCK   REVISON OF ARTERIOVENOUS FISTULA Left 08/06/2022   Procedure: LEFT UPPER EXTREMITY FISTULA LIGATION;  Surgeon: Leonie Douglas, MD;  [facility]: [facility];  Service: Vascular;  Laterality: Left;   RIGHT HEART CATH N/A 07/09/2022   Procedure: RIGHT HEART CATH;  Surgeon: Dorthula Nettles, DO;  Location: [facility];  Service: Cardiovascular;  Laterality: N/A;    Family History  Problem Relation Age of Onset   Diabetes Father    Hypertension Father    Cancer Father    Heart disease Father    Aneurysm Mother    Seizures Mother    Social History:  reports that she has quit smoking. Her smoking use included cigarettes. She started smoking about 29 years ago. She has a 7.3 pack-year smoking history. She has been exposed to tobacco smoke. She has never used smokeless tobacco. She reports that she does not currently use alcohol. She reports current drug use. Drug: Marijuana.  Allergies:  Allergies  Allergen Reactions   Glycerin-Hypromellose-Peg 400 Swelling   Visine [Tetrahydrozoline Hcl] Swelling and Other (See Comments)    Eyes Swell    Facility-Administered Medications Prior to Admission  Medication Dose Route Frequency Provider Last Rate Last Admin   sodium chloride flush (NS) 0.9 % injection 3 mL  3 mL Intravenous Q12H Wendall Stade, MD       Medications Prior to Admission  Medication Sig Dispense Refill   acetaminophen (TYLENOL) 500 MG tablet Take 500-1,000 mg by mouth every 6 (six) hours as needed for mild pain or headache.     amLODipine (NORVASC) 10 MG tablet Take 1 tablet (10 mg total) by mouth at bedtime. 30 tablet 0   B Complex-C-Folic Acid (DIALYVITE TABLET) TABS Take 1 tablet by mouth daily.     CALCITRIOL PO Take by mouth daily.     carvedilol (COREG) 25 MG tablet Take 1 tablet (25 mg total) by mouth 2 (two) times daily with a meal. Take 25 mg by mouth at 6:30 AM and 6:00 PM on Sun/Mon/Wed/Fri and 25 mg at 11:00 AM and 8:00 PM on Tues/Thurs/Sat     cinacalcet (SENSIPAR) 30 MG tablet Take 30 mg by mouth daily.     hydrALAZINE (APRESOLINE) 100 MG tablet Take 1 tablet (100 mg total) by mouth 3 (three) times daily. 90 tablet 0   levETIRAcetam (KEPPRA) 1000 MG tablet Take 1,000 mg by mouth daily.     oxyCODONE-acetaminophen (PERCOCET/ROXICET)  5-325 MG tablet Take 1 tablet by mouth every 6 (six) hours as needed for severe pain (pain score 7-10). 10 tablet 0   sevelamer carbonate (RENVELA) 800 MG tablet Take 1,600-2,400 mg by mouth 3 (three) times daily with meals. 800 mg with snack twice a day     spironolactone (ALDACTONE) 50 MG tablet Take 1 tablet (50 mg total) by mouth daily. 30 tablet 0   Doxercalciferol (HECTOROL IV) Doxercalciferol (Hectorol)     Methoxy PEG-Epoetin Beta (MIRCERA IJ) Mircera      No results found for this or any previous visit (from the past 48 hours). No results found.  Review of Systems  Musculoskeletal:        Intermittent pain in right arm/armpit area tracking into breast  All other systems reviewed and are negative.   Last menstrual period 12/14/2017, unknown if currently breastfeeding.  Physical Exam Vitals and nursing note reviewed.  Constitutional:      General: She is not in acute distress.    Appearance: She is normal weight. She is not ill-appearing.  HENT:     Head: Normocephalic and atraumatic.     Right Ear: External ear normal.     Left Ear: External ear normal.     Nose: Nose normal.     Mouth/Throat:     Mouth: Mucous membranes are moist.     Pharynx: Oropharynx is clear.  Eyes:     Extraocular Movements: Extraocular movements intact.     Conjunctiva/sclera: Conjunctivae normal.  Cardiovascular:     Rate and Rhythm: Normal rate and regular rhythm.     Pulses: Normal pulses.     Heart sounds: Normal heart sounds.  Pulmonary:     Effort: Pulmonary effort is normal.     Breath sounds: Normal breath sounds.  Abdominal:     General: Bowel sounds are normal.     Palpations: Abdomen is soft.  Musculoskeletal:     Cervical back: Normal range of motion and neck supple.     Right lower leg: No edema.     Left lower leg: No edema.     Comments: Right upper arm AV graft that is superficial and easily palpable with resorbing hematoma that is restricted to the upper one third of the  upper arm.  Hematoma is nonpulsatile and tracks into the armpit/breast area.  Ultrasound shows superficial graft segment that is easily compressible in lower two thirds of the arm.  Neurological:     Mental Status: She is alert.      Assessment/Plan 1.  Right upper arm AV graft infiltration: Hematoma resolving well and there is sufficient room for cannulation at hemodialysis.  Had a lengthy discussion with the patient and she is willing to proceed without hemodialysis catheter placement at this time and continue using the AV graft for hemodialysis.  Continue routine access care/monitoring and refer for problems. 2.  ESRD: Resume TTS hemodialysis 3.  Hypertension: Resume hemodialysis/antihypertensive therapy.  Dagoberto Ligas, MD 07/06/2023, 7:20 AM

## 2023-08-30 ENCOUNTER — Emergency Department (HOSPITAL_COMMUNITY)

## 2023-08-30 ENCOUNTER — Other Ambulatory Visit: Payer: Self-pay

## 2023-08-30 ENCOUNTER — Encounter (HOSPITAL_COMMUNITY): Payer: Self-pay

## 2023-08-30 ENCOUNTER — Observation Stay (HOSPITAL_COMMUNITY)
Admission: EM | Admit: 2023-08-30 | Discharge: 2023-08-31 | Disposition: A | Discharge status: Home / Self Care | Attending: Internal Medicine | Admitting: Internal Medicine

## 2023-08-30 DIAGNOSIS — Z8673 Personal history of transient ischemic attack (TIA), and cerebral infarction without residual deficits: Secondary | ICD-10-CM | POA: Insufficient documentation

## 2023-08-30 DIAGNOSIS — Z992 Dependence on renal dialysis: Secondary | ICD-10-CM | POA: Diagnosis not present

## 2023-08-30 DIAGNOSIS — G40909 Epilepsy, unspecified, not intractable, without status epilepticus: Secondary | ICD-10-CM | POA: Diagnosis not present

## 2023-08-30 DIAGNOSIS — Z79899 Other long term (current) drug therapy: Secondary | ICD-10-CM | POA: Insufficient documentation

## 2023-08-30 DIAGNOSIS — D696 Thrombocytopenia, unspecified: Secondary | ICD-10-CM | POA: Diagnosis not present

## 2023-08-30 DIAGNOSIS — Z1152 Encounter for screening for COVID-19: Secondary | ICD-10-CM | POA: Insufficient documentation

## 2023-08-30 DIAGNOSIS — F1290 Cannabis use, unspecified, uncomplicated: Secondary | ICD-10-CM | POA: Diagnosis not present

## 2023-08-30 DIAGNOSIS — J9601 Acute respiratory failure with hypoxia: Secondary | ICD-10-CM | POA: Diagnosis not present

## 2023-08-30 DIAGNOSIS — D649 Anemia, unspecified: Secondary | ICD-10-CM | POA: Diagnosis present

## 2023-08-30 DIAGNOSIS — Z87891 Personal history of nicotine dependence: Secondary | ICD-10-CM | POA: Diagnosis not present

## 2023-08-30 DIAGNOSIS — N2581 Secondary hyperparathyroidism of renal origin: Secondary | ICD-10-CM | POA: Diagnosis not present

## 2023-08-30 DIAGNOSIS — D531 Other megaloblastic anemias, not elsewhere classified: Secondary | ICD-10-CM | POA: Diagnosis not present

## 2023-08-30 DIAGNOSIS — I5031 Acute diastolic (congestive) heart failure: Secondary | ICD-10-CM | POA: Diagnosis present

## 2023-08-30 DIAGNOSIS — J810 Acute pulmonary edema: Principal | ICD-10-CM | POA: Diagnosis present

## 2023-08-30 DIAGNOSIS — D631 Anemia in chronic kidney disease: Secondary | ICD-10-CM | POA: Diagnosis present

## 2023-08-30 DIAGNOSIS — N186 End stage renal disease: Principal | ICD-10-CM | POA: Insufficient documentation

## 2023-08-30 DIAGNOSIS — R0602 Shortness of breath: Secondary | ICD-10-CM | POA: Diagnosis present

## 2023-08-30 DIAGNOSIS — I10 Essential (primary) hypertension: Secondary | ICD-10-CM | POA: Diagnosis present

## 2023-08-30 DIAGNOSIS — I132 Hypertensive heart and chronic kidney disease with heart failure and with stage 5 chronic kidney disease, or end stage renal disease: Secondary | ICD-10-CM | POA: Diagnosis not present

## 2023-08-30 LAB — RESP PANEL BY RT-PCR (RSV, FLU A&B, COVID)  RVPGX2
Influenza A by PCR: NEGATIVE
Influenza B by PCR: NEGATIVE
Resp Syncytial Virus by PCR: NEGATIVE
SARS Coronavirus 2 by RT PCR: NEGATIVE

## 2023-08-30 LAB — BASIC METABOLIC PANEL
Anion gap: 18 — ABNORMAL HIGH (ref 5–15)
BUN: 44 mg/dL — ABNORMAL HIGH (ref 6–20)
CO2: 23 mmol/L (ref 22–32)
Calcium: 9.6 mg/dL (ref 8.9–10.3)
Chloride: 95 mmol/L — ABNORMAL LOW (ref 98–111)
Creatinine, Ser: 8.26 mg/dL — ABNORMAL HIGH (ref 0.44–1.00)
GFR, Estimated: 6 mL/min — ABNORMAL LOW (ref >60)
Glucose, Bld: 89 mg/dL (ref 70–99)
Potassium: 4.1 mmol/L (ref 3.5–5.1)
Sodium: 136 mmol/L (ref 135–145)

## 2023-08-30 LAB — CBC
HCT: 29.8 % — ABNORMAL LOW (ref 36.0–46.0)
Hemoglobin: 9.8 g/dL — ABNORMAL LOW (ref 12.0–15.0)
MCH: 34.9 pg — ABNORMAL HIGH (ref 26.0–34.0)
MCHC: 32.9 g/dL (ref 30.0–36.0)
MCV: 106 fL — ABNORMAL HIGH (ref 80.0–100.0)
Platelets: 93 10*3/uL — ABNORMAL LOW (ref 150–400)
RBC: 2.81 MIL/uL — ABNORMAL LOW (ref 3.87–5.11)
RDW: 16.3 % — ABNORMAL HIGH (ref 11.5–15.5)
WBC: 5 10*3/uL (ref 4.0–10.5)
nRBC: 0 % (ref 0.0–0.2)

## 2023-08-30 LAB — BRAIN NATRIURETIC PEPTIDE: B Natriuretic Peptide: 250.3 pg/mL — ABNORMAL HIGH (ref 0.0–100.0)

## 2023-08-30 LAB — PHOSPHORUS: Phosphorus: 7.9 mg/dL — ABNORMAL HIGH (ref 2.5–4.6)

## 2023-08-30 LAB — MAGNESIUM: Magnesium: 2.4 mg/dL (ref 1.7–2.4)

## 2023-08-30 LAB — HEPATITIS B SURFACE ANTIGEN: Hepatitis B Surface Ag: NONREACTIVE

## 2023-08-30 LAB — HIV ANTIBODY (ROUTINE TESTING W REFLEX): HIV Screen 4th Generation wRfx: NONREACTIVE

## 2023-08-30 MED ORDER — SUCROFERRIC OXYHYDROXIDE 500 MG PO CHEW
500.0000 mg | CHEWABLE_TABLET | Freq: Three times a day (TID) | ORAL | Status: DC
Start: 2023-08-30 — End: 2023-08-31
  Administered 2023-08-30 – 2023-08-31 (×2): 500 mg via ORAL
  Filled 2023-08-30 (×4): qty 1

## 2023-08-30 MED ORDER — CARVEDILOL 25 MG PO TABS
25.0000 mg | ORAL_TABLET | ORAL | Status: DC
  Administered 2023-08-31: 25 mg via ORAL
  Filled 2023-08-30: qty 1

## 2023-08-30 MED ORDER — CINACALCET HCL 30 MG PO TABS
30.0000 mg | ORAL_TABLET | Freq: Every day | ORAL | Status: DC
  Administered 2023-08-30 – 2023-08-31 (×2): 30 mg via ORAL
  Filled 2023-08-30 (×2): qty 1

## 2023-08-30 MED ORDER — LEVETIRACETAM 500 MG PO TABS
1000.0000 mg | ORAL_TABLET | Freq: Every day | ORAL | Status: DC
  Administered 2023-08-30 – 2023-08-31 (×2): 1000 mg via ORAL
  Filled 2023-08-30 (×2): qty 2

## 2023-08-30 MED ORDER — SPIRONOLACTONE 25 MG PO TABS
50.0000 mg | ORAL_TABLET | Freq: Every day | ORAL | Status: DC
  Administered 2023-08-31: 50 mg via ORAL
  Filled 2023-08-30: qty 2

## 2023-08-30 MED ORDER — AMLODIPINE BESYLATE 10 MG PO TABS: 10.0000 mg | ORAL_TABLET | Freq: Every day | ORAL | Status: DC

## 2023-08-30 MED ORDER — ACETAMINOPHEN 650 MG RE SUPP: 650.0000 mg | Freq: Four times a day (QID) | RECTAL | Status: DC | PRN

## 2023-08-30 MED ORDER — ACETAMINOPHEN 325 MG PO TABS: 650.0000 mg | ORAL_TABLET | Freq: Four times a day (QID) | ORAL | Status: DC | PRN

## 2023-08-30 MED ORDER — HEPARIN SODIUM (PORCINE) 5000 UNIT/ML IJ SOLN
5000.0000 [IU] | Freq: Three times a day (TID) | INTRAMUSCULAR | Status: DC
  Administered 2023-08-30 – 2023-08-31 (×2): 5000 [IU] via SUBCUTANEOUS
  Filled 2023-08-30 (×4): qty 1

## 2023-08-30 MED ORDER — B COMPLEX-C PO TABS
1.0000 | ORAL_TABLET | Freq: Every day | ORAL | Status: DC
  Administered 2023-08-30 – 2023-08-31 (×2): 1 via ORAL
  Filled 2023-08-30 (×2): qty 1

## 2023-08-30 MED ORDER — CHLORHEXIDINE GLUCONATE CLOTH 2 % EX PADS
6.0000 | MEDICATED_PAD | Freq: Every day | CUTANEOUS | Status: DC
  Administered 2023-08-31: 6 via TOPICAL

## 2023-08-30 MED ORDER — CALCITRIOL 0.25 MCG PO CAPS
0.2500 ug | ORAL_CAPSULE | Freq: Every day | ORAL | Status: DC
  Administered 2023-08-31: 0.25 ug via ORAL
  Filled 2023-08-30 (×2): qty 1

## 2023-08-30 MED ORDER — SENNOSIDES-DOCUSATE SODIUM 8.6-50 MG PO TABS: 1.0000 | ORAL_TABLET | Freq: Every evening | ORAL | Status: DC | PRN

## 2023-08-30 MED ORDER — HYDRALAZINE HCL 25 MG PO TABS
100.0000 mg | ORAL_TABLET | Freq: Two times a day (BID) | ORAL | Status: DC
  Administered 2023-08-31: 100 mg via ORAL
  Filled 2023-08-30: qty 4

## 2023-08-30 MED ORDER — FUROSEMIDE 10 MG/ML IJ SOLN
40.0000 mg | Freq: Once | INTRAMUSCULAR | Status: AC
  Administered 2023-08-30: 40 mg via INTRAVENOUS
  Filled 2023-08-30: qty 4

## 2023-08-30 MED ORDER — SEVELAMER CARBONATE 800 MG PO TABS
1600.0000 mg | ORAL_TABLET | Freq: Three times a day (TID) | ORAL | Status: DC
  Administered 2023-08-30: 2400 mg via ORAL
  Administered 2023-08-31 (×2): 1600 mg via ORAL
  Filled 2023-08-30 (×2): qty 2
  Filled 2023-08-30: qty 3

## 2023-08-30 NOTE — Hospital Course (Addendum)
 Janaiah Vetrano is a 48 y.o. person living with a history of hypertension, history of CVA, HFpEF, end-stage renal disease on hemodialysis who presented with dyspnea and admitted for acute hypoxic respiratory failure secondary to HFpEF exacerbation on hospital day 0   Acute Hypoxic Respiratory failure HFpEF [60-65%], mild exacerbation PHTN Presented with symptoms of shortness of breath this morning at work, was found to be hypoxic by EMS saturating at 88%, placed on 2 L oxygen.  Denies any recent sickness, sick contacts, any symptoms of congestion, sore throat, fevers.  Reports on and off dry cough, that is not worsening.  Denies any lower extremity edema bilaterally.  Reports her dry weight is about 56 kg, noted to be 56 kg yesterday after dialysis, was noted 59 kg today.  Very compliant on her medications.  Does report missing 1 day of dialysis last week on Thursday however did go to her Saturday session of dialysis.  RVP negative.  Chest x-ray showed increased alveolar and interstitial markings favoring pulmonary edema.  Per evaluation, warm extremities, euvolemic on exam, no lower extremity edema, lungs clear to auscultation bilaterally, low suspicion for infectious process such as CAP. She is requiring 2L oxygen. Appears to have mild HF exacerbation. She is status post IV Lasix 40 mg in the ED. -Continue home GDMT medications: Coreg 25 mg twice daily, hydralazine 100 mg twice daily, spironolactone 50 mg. -Strict I's and O's, daily weights -Wean O2 as patient tolerates wit goal O2 > 90% -Check pulse ox while ambulating -PT eval   ESRD Hyperphosphatemia Patient reports that she missed her dialysis session on Thursday due to family issues, was able to go to following Saturday with no concerns.  Chest x-ray finding consistent with pulmonary vascular congestion, clinically no lower extremity edema, JVD, appears euvolemic on exam.  Nephrology is consulted in the ED.  Status post IV Lasix 40 mg. -Possible  HD tonight with UF 3L as tolerated per nephro -Continue home medication calcitriol -Continue home medication Cinnasil at 30 mg daily -Continue home medication Dialyvite -Continue home medication Renvela 1600-2400 3 times daily with meals -Continue home medication Velphoro 500 mg 3 times daily -Strict I's and O's, daily weights, renal diet/fluid restrictions   HTN Continue home medication amlodipine 10 mg.    Macrocytic Anemia Thrombocytopenia  Anemia of Chronic Kidney Disease  Hgb 9.8, MCV 106, PLT 93. No acute signs of overt bleed. Stable.  - Monitor CBC   ==================================================================== Ms. Floris, [place] came to the hospital for feeling short of breath and fluids in the vessels of your lungs. We treated you with hemodialysis session and lasix to get that fluid out of you.    If you have any of these following symptoms, please call us or seek care at an emergency department: -Chest Pain -Difficulty Breathing -Worsening abdominal pain -Syncope (passing out) -Drooping of face -Slurred speech -Sudden weakness in your leg or arm -Fever -Chills -blood in the stool -dark black, sticky stool  If you have any questions or concerns, call our clinic at [phone] or after hours call [phone] and ask for the internal medicine resident on call.  I am glad you are feeling better. It was a pleasure taking care for you. I wish a good recovery and good health!   Dr. Jeral Pinch   =========================================================

## 2023-08-30 NOTE — ED Provider Notes (Addendum)
  EMERGENCY DEPARTMENT AT [facility] Provider Note   CSN: [id number] Arrival date & time: 08/30/23  4098     History  Chief Complaint  Patient presents with   Shortness of Breath    Madeline Brown is a 48 y.o. female with a history of end-stage renal disease on dialysis, hypertension, presented to the ED with shortness of breath.  Patient reports that she normally goes to dialysis on Tuesday Thursday and Saturday.  She did miss Thursday but went yesterday to regular dialysis.  She does make urine.  She said she has had some congestion and cough for a few days.  She was working today at [employer] and began to feel abruptly short of breath about an hour ago.  She arrived by EMS with supplemental oxygen 2 L.  She says that currently at rest she is not feeling short of breath.  She is denying fevers, productive cough.  HPI     Home Medications Prior to Admission medications   Medication Sig Start Date End Date Taking? Authorizing Provider  acetaminophen (TYLENOL) 500 MG tablet Take 500-1,000 mg by mouth every 6 (six) hours as needed for mild pain or headache.    [provider]  amLODipine (NORVASC) 10 MG tablet Take 1 tablet (10 mg total) by mouth at bedtime. 12/09/19 07/03/23  Eliezer Bottom, MD  B Complex-C-Folic Acid (DIALYVITE TABLET) TABS Take 1 tablet by mouth daily.    [provider]  CALCITRIOL PO Take by mouth daily.    [provider]  carvedilol (COREG) 25 MG tablet Take 1 tablet (25 mg total) by mouth 2 (two) times daily with a meal. Take 25 mg by mouth at 6:30 AM and 6:00 PM on Sun/Mon/Wed/Fri and 25 mg at 11:00 AM and 8:00 PM on Tues/Thurs/Sat 08/06/22 07/03/23  Dara Lords, PA-C  cinacalcet (SENSIPAR) 30 MG tablet Take 30 mg by mouth daily.    [provider]  Doxercalciferol (HECTOROL IV) Doxercalciferol (Hectorol) 09/09/22 09/08/23  [provider]  hydrALAZINE (APRESOLINE) 100 MG tablet Take 1 tablet  (100 mg total) by mouth 3 (three) times daily. 12/09/19 07/03/23  Eliezer Bottom, MD  levETIRAcetam (KEPPRA) 1000 MG tablet Take 1,000 mg by mouth daily.    [provider]  Methoxy PEG-Epoetin Beta (MIRCERA IJ) Mircera 06/16/23 06/14/24  [provider]  oxyCODONE-acetaminophen (PERCOCET/ROXICET) 5-325 MG tablet Take 1 tablet by mouth every 6 (six) hours as needed for severe pain (pain score 7-10). 07/01/23   Linwood Dibbles, MD  sevelamer carbonate (RENVELA) 800 MG tablet Take 1,600-2,400 mg by mouth 3 (three) times daily with meals. 800 mg with snack twice a day 02/10/23   [provider]  spironolactone (ALDACTONE) 50 MG tablet Take 1 tablet (50 mg total) by mouth daily. 12/09/19   Eliezer Bottom, MD  VELPHORO 500 MG chewable tablet Chew 500 mg by mouth 3 (three) times daily.    [provider]      Allergies    Glycerin-hypromellose-peg 400 and Visine [tetrahydrozoline hcl]    Review of Systems   Review of Systems  Physical Exam Updated Vital Signs BP (!) 156/108   Pulse 87   Temp 98.8 F (37.1 C)   Resp 20   Ht 5' (1.524 m)   Wt 59 kg   LMP 12/14/2017   SpO2 96%   BMI 25.39 kg/m  Physical Exam Constitutional:      General: She is not in acute distress. HENT:  Head: Normocephalic and atraumatic.  Eyes:     Conjunctiva/sclera: Conjunctivae normal.     Pupils: Pupils are equal, round, and reactive to light.  Cardiovascular:     Rate and Rhythm: Normal rate and regular rhythm.  Pulmonary:     Effort: Pulmonary effort is normal. No respiratory distress.     Comments: 91% on room air at rest Crackles in bilateral mid and lower lung field Abdominal:     General: There is no distension.     Tenderness: There is no abdominal tenderness.  Musculoskeletal:     Right lower leg: Edema present.     Left lower leg: Edema present.  Skin:    General: Skin is warm and dry.  Neurological:     General: No focal deficit present.     Mental Status: She is  alert. Mental status is at baseline.  Psychiatric:        Mood and Affect: Mood normal.        Behavior: Behavior normal.     ED Results / Procedures / Treatments   Labs (all labs ordered are listed, but only abnormal results are displayed) Labs Reviewed  BASIC METABOLIC PANEL - Abnormal; Notable for the following components:      Result Value   Chloride 95 (*)    BUN 44 (*)    Creatinine, Ser 8.26 (*)    GFR, Estimated 6 (*)    Anion gap 18 (*)    All other components within normal limits  CBC - Abnormal; Notable for the following components:   RBC 2.81 (*)    Hemoglobin 9.8 (*)    HCT 29.8 (*)    MCV 106.0 (*)    MCH 34.9 (*)    RDW 16.3 (*)    Platelets 93 (*)    All other components within normal limits  PHOSPHORUS - Abnormal; Notable for the following components:   Phosphorus 7.9 (*)    All other components within normal limits  RESP PANEL BY RT-PCR (RSV, FLU A&B, COVID)  RVPGX2  MAGNESIUM    EKG EKG Interpretation Date/Time:  Sunday August 30 2023 10:00:26 EDT Ventricular Rate:  88 PR Interval:  230 QRS Duration:  84 QT Interval:  398 QTC Calculation: 482 R Axis:   85  Text Interpretation: Sinus rhythm Prolonged PR interval Left atrial enlargement Confirmed by Alvester Chou [phone]) on 08/30/2023 10:04:01 AM  Radiology DG Chest 2 View Result Date: 08/30/2023 CLINICAL DATA:  Shortness of breath. EXAM: CHEST - 2 VIEW COMPARISON:  06/30/2023. FINDINGS: Mild diffuse increased alveolar and interstitial markings with bilateral hilar predominance. There are probable atelectatic changes in the left retrocardiac region. Bilateral lung fields are otherwise clear. No dense consolidation or lung collapse. Bilateral costophrenic angles are clear. Stable cardio-mediastinal silhouette. No acute osseous abnormalities. The soft tissues are within normal limits. IMPRESSION: *Mild diffuse increased alveolar and interstitial markings with bilateral hilar predominance, favoring  pulmonary edema versus atypical pneumonia. Correlate clinically. Electronically Signed   By: Jules Schick M.D.   On: 08/30/2023 10:53    Procedures .Critical Care  Performed by: Terald Sleeper, MD Authorized by: Terald Sleeper, MD   Critical care provider statement:    Critical care time (minutes):  40   Critical care time was exclusive of:  Separately billable procedures and treating other patients   Critical care was necessary to treat or prevent imminent or life-threatening deterioration of the following conditions:  Circulatory failure   Critical care was time  spent personally by me on the following activities:  Ordering and performing treatments and interventions, ordering and review of laboratory studies, ordering and review of radiographic studies, pulse oximetry, review of old charts, examination of patient and evaluation of patient's response to treatment   Care discussed with: admitting provider   Comments:     IV diuresis for pulmonary edema with hypoxemia     Medications Ordered in ED Medications  furosemide (LASIX) injection 40 mg (has no administration in time range)    ED Course/ Medical Decision Making/ A&P Clinical Course as of 08/30/23 1219  Sun Aug 30, 2023  1059 Potassium: 4.1 [MT]  1134 Patient appears of volume overload, likely in the setting of missed dialysis.  No clear evidence of infection, no fever or leukocytosis.  Flu and COVID test are negative.  We will plan for IV diuresis if she does produce urine, will consult with nephrology, anticipating likely medical admission given the hypoxemia and new oxygen requirement. [MT]  1140 Nephrology consulted  [MT]  1218 Admitted to IM teaching service [MT]    Clinical Course User Index [MT] Rukaya Kleinschmidt, Kermit Balo, MD                                 Medical Decision Making Amount and/or Complexity of Data Reviewed Labs: ordered. Decision-making details documented in ED Course. Radiology:  ordered.  Risk Prescription drug management. Decision regarding hospitalization.   This patient presents to the ED with concern for shortness of breath. This involves an extensive number of treatment options, and is a complaint that carries with it a high risk of complications and morbidity.  The differential diagnosis includes viral URI versus anemia versus pleural effusion versus bacterial infection versus pneumonia versus other  Co-morbidities that complicate the patient evaluation: End-stage renal disease at high risk of metabolic and pulmonary overload complications  I ordered and personally interpreted labs.  The pertinent results include: Labs are near baseline levels, potassium 4.1, anion gap 18, hemoglobin 9.8.  COVID and flu are negative.  I ordered imaging studies including x-ray of the chest I independently visualized and interpreted imaging which showed pulmonary edema pattern I agree with the radiologist interpretation  The patient was maintained on a cardiac monitor.  I personally viewed and interpreted the cardiac monitored which showed an underlying rhythm of: Sinus rhythm  Per my interpretation the patient's ECG shows no acute ischemic findings  I ordered medication including IV lasix for diuresis  I have reviewed the patients home medicines and have made adjustments as needed  Test Considered: Lower suspicion for acute PE.  I suspect that she has pulmonary edema related to a viral URI that she has had a cough for 1 week, and also missed dialysis on Thursday.  Do not believe she needs a CT angiogram.  I requested consultation with the nephrology,  and discussed lab and imaging findings as well as pertinent plan - they recommend: consult pending  After the interventions noted above, I reevaluated the patient and found that they have: stayed the same   Dispostion:  After consideration of the diagnostic results and the patients response to treatment, I feel that the  patent would benefit from medical admission.  Patient is stable on 2 L nasal cannula at this time.  She has not been impending respiratory failure not requiring intubation or BiPAP.  She will likely need dialysis while in the hospital, nephrology has been  consulted         Final Clinical Impression(s) / ED Diagnoses Final diagnoses:  Acute pulmonary edema [facility])    Rx / DC Orders ED Discharge Orders     None         Ravyn Nikkel, Kermit Balo, MD 08/30/23 1219    Terald Sleeper, MD 08/30/23 1220

## 2023-08-30 NOTE — ED Triage Notes (Signed)
"  From work, said around 0900 she began having shortness of breath, she is a T,T,S dialysis patient and missed Thursday and went yesterday and everything went fine. She said last time she felt this way she had fluid on her lungs. O2 sats were 88 on room air" per EMS

## 2023-08-30 NOTE — H&P (Cosign Needed Addendum)
 Date: 08/30/2023               Patient Name:  Madeline Brown MRN: [id number]  DOB: 04-11-1976 Age / Sex: 48 y.o., female   PCP: Raymon Mutton., FNP         Medical Service: Internal Medicine Teaching Service         Attending Physician: Dr. Dickie La, MD      First Contact: Dr. Jeral Pinch, DO Pager [phone]    Second Contact: Dr. Modena Slater, DO Pager [phone]         After Hours (After 5p/  First Contact Pager: [phone]  weekends / holidays): Second Contact Pager: [phone]   SUBJECTIVE   Chief Complaint: "shortness of breath"   History of Present Illness:   This is a 48 year old female past medical history of hypertension, history of CVA, HFpEF, end-stage renal disease on hemodialysis on TThS presented to ED for dyspnea.    Patient was at work today and noted that she became short of breath roughly around 09:30 AM. She notes that she works at [employer]. She denies congestion but she does report a cough, on and off, dry in nature, not worsening. No headaches, no fevers, chills, or shortness of breath. No N/V/D or abdominal pain. She denies any leg swelling.   No changes in her weight recently. She does check her weight at home and her dry weight is 55 kg. She states that she has not been eating to well over the past few weeks. She states that today she is 59 kgs. Yesterday she notes that she was 55 kg at dialysis.   She denies any swelling on her arms, legs, or abdomen. She missed her Thursday's dialysis session because she was having some concerns with her oldest daughter and was not able to go to dialysis. She went to dialysis session yesterday [Saturday], went well with some abdominal cramping.   ED Course: Placed on 2L Nephrology consulted.  Past Medical History: 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   History of blood transfusion 07/2020   Hypertension    Intracranial  hemorrhage (HCC)    LVH (left ventricular hypertrophy)    Pericardial effusion    Pneumonia    Pulmonary hypertension (HCC)    Seizure (HCC) 02/2019   last one was in 02/2019   Shortness of breath 07/15/2018   on 3L via [place] prn   Stroke (HCC)    Mild was told after having a seizure   Substance abuse (HCC)    Marijuana   Thrombocytopenia (HCC)   ESRD on dialysis since Jan 2019, Seizures  Meds:  Tylenol 500 mg every 6 hours as needed for pain Amlodipine 10 mg daily at bedtime Spironolactone 50 mg daily Hydralazine 100 mg 3 times daily Carvedilol 25 mg by mouth 2 times daily, Take 25 mg by mouth at 6:30 AM and 6:00 PM on Sun/Mon/Wed/Fri and 25 mg at 11:00 AM and 8:00 PM on Tues/Thurs/Sat  B complex-C-folate acid tablets daily Calcitriol daily Cinacalcet at 30 mg daily Renvela 1600 mg-2400 mg by mouth 3 times daily with meals Velphoro 500 mg 3 times daily Micera Keppra 1000 daily   Past Surgical History:  Procedure Laterality Date   AV FISTULA PLACEMENT Left 06/24/2017   Procedure: CREATION OF LEFT ARM BRACHIALCEPHALIC  ARTERIOVENOUS (AV) FISTULA;  Surgeon: Larina Earthly, MD;  [facility]: [facility];  Service: Vascular;  Laterality: Left;   AV FISTULA PLACEMENT Right 10/20/2022   Procedure: INSERTION OF RIGHT ARM ARTERIOVENOUS (AV) GORE-TEX GRAFT;  Surgeon: Leonie Douglas, MD;  Location: [facility];  Service: Vascular;  Laterality: Right;   CARDIAC CATHETERIZATION     CESAREAN SECTION     x2   COMPLEX WOUND CLOSURE Left 07/30/2020   Procedure: COMPLEX WOUND CLOSURE;  Surgeon: Leonie Douglas, MD;  Location: [facility];  Service: Vascular;  Laterality: Left;   FISTULA SUPERFICIALIZATION Left 10/28/2017   Procedure: SUPERFICIALIZATION LEFT BRACHIOCEPHALIC ARTERIOVENOUS FISTULA;  Surgeon: Larina Earthly, MD;  [facility];  Service: Vascular;  Laterality: Left;   INGUINAL HERNIA REPAIR Bilateral 02/01/2021   Procedure: LAPAROSCOPIC BILATERAL INGUINAL HERNIA REPAIR WITH MESH;  Surgeon: Kinsinger,  De Blanch, MD;  Location: [facility];  Service: General;  Laterality: Bilateral;   INSERTION OF DIALYSIS CATHETER Right 06/24/2017   Procedure: INSERTION OF TUNNELED DIALYSIS CATHETER;  Surgeon: Larina Earthly, MD;  [facility];  Service: Vascular;  Laterality: Right;   INSERTION OF DIALYSIS CATHETER Right 07/30/2020   Procedure: INSERTION OF DIALYSIS CATHETER;  Surgeon: Leonie Douglas, MD;  [facility];  Service: Vascular;  Laterality: Right;   INSERTION OF DIALYSIS CATHETER Right 08/06/2022   Procedure: INSERTION OF TUNNELED DIALYSIS CATHETER;  Surgeon: Leonie Douglas, MD;  [facility];  Service: Vascular;  Laterality: Right;   LIGATION OF COMPETING BRANCHES OF ARTERIOVENOUS FISTULA  10/28/2017   Procedure: LIGATION OF COMPETING BRANCHES OF ARTERIOVENOUS FISTULA x3;  Surgeon: Larina Earthly, MD;  [facility];  Service: Vascular;;   REVISON OF ARTERIOVENOUS FISTULA Left 07/30/2020   Procedure: LEFT UPPER EXTREMITY ARTERIOVENOUS FISTULA REVISON;  Surgeon: Leonie Douglas, MD;  [facility];  Service: Vascular;  Laterality: Left;  PERIPHERAL NERVE BLOCK   REVISON OF ARTERIOVENOUS FISTULA Left 08/06/2022   Procedure: LEFT UPPER EXTREMITY FISTULA LIGATION;  Surgeon: Leonie Douglas, MD;  [facility];  Service: Vascular;  Laterality: Left;   RIGHT HEART CATH N/A 07/09/2022   Procedure: RIGHT HEART CATH;  Surgeon: Dorthula Nettles, DO;  [facility];  Service: Cardiovascular;  Laterality: N/A;    Social:  Living Situation: Lives with husband and 3 daughters at home  PCP: Raymon Mutton., FNP Level of function: Independent in all ADLs and iADLs  Support: Good support in her in family  Occupation: Public librarian  Tobacco: Former smoker from 2024 September  Alcohol: No alcohol  Drugs: Marijuana user, 1 joint a day   Family History:  Father: Diabetes mellitus  Mother: Epilepsy   Allergies: Allergies as of 08/30/2023 - Review Complete 08/30/2023   Allergen Reaction Noted   Glycerin-hypromellose-peg 400 Swelling 07/29/2019   Visine [tetrahydrozoline hcl] Swelling and Other (See Comments) 07/12/2015    Review of Systems: A complete ROS was negative except as per HPI.   OBJECTIVE:   Physical Exam: Blood pressure (!) 156/108, pulse 87, temperature 98.8 F (37.1 C), resp. rate 20, height 5' (1.524 m), weight 59 kg, last menstrual period 12/14/2017, SpO2 96%, unknown if currently breastfeeding.   Constitutional: Well-appearing, laying in bed, no acute distress HENT: normocephalic atraumatic, mucous membranes moist Eyes: conjunctiva non-erythematous Neck: supple, no JVD, negative hepatojugular reflex Cardiovascular: regular rate and rhythm, no murmurs appreciated Pulmonary/Chest: Lungs are clear to auscultation bilaterally, no wheezing or rales noted Abdominal: soft, non-tender, non-distended bowel sounds present MSK: No lower extremity edema bilaterally Neurological: alert &  oriented x 3, answering questions appropriately, participating in conversation, no focal deficits Skin: warm and dry Psych: Pleasant mood and affect  Labs: CBC    Component Value Date/Time   WBC 5.0 08/30/2023 1004   RBC 2.81 (L) 08/30/2023 1004   HGB 9.8 (L) 08/30/2023 1004   HGB 9.2 (L) 07/04/2022 1001   HCT 29.8 (L) 08/30/2023 1004   HCT 27.8 (L) 07/04/2022 1001   PLT 93 (L) 08/30/2023 1004   PLT 110 (L) 07/04/2022 1001   MCV 106.0 (H) 08/30/2023 1004   MCV 95 07/04/2022 1001   MCH 34.9 (H) 08/30/2023 1004   MCHC 32.9 08/30/2023 1004   RDW 16.3 (H) 08/30/2023 1004   RDW 14.5 07/04/2022 1001   LYMPHSABS 0.6 (L) 06/30/2023 0823   LYMPHSABS 0.8 07/04/2022 1001   MONOABS 0.3 06/30/2023 0823   EOSABS 0.2 06/30/2023 0823   EOSABS 0.1 07/04/2022 1001   BASOSABS 0.0 06/30/2023 0823   BASOSABS 0.0 07/04/2022 1001     CMP     Component Value Date/Time   NA 136 08/30/2023 1004   NA 140 07/04/2022 1001   K 4.1 08/30/2023 1004   CL 95 (L)  08/30/2023 1004   CO2 23 08/30/2023 1004   GLUCOSE 89 08/30/2023 1004   BUN 44 (H) 08/30/2023 1004   BUN 35 (H) 07/04/2022 1001   CREATININE 8.26 (H) 08/30/2023 1004   CALCIUM 9.6 08/30/2023 1004   PROT 7.1 06/30/2023 0823   PROT 6.5 02/25/2017 1103   ALBUMIN 3.5 06/30/2023 0823   ALBUMIN 3.7 02/25/2017 1103   AST 16 06/30/2023 0823   ALT 10 06/30/2023 0823   ALKPHOS 35 (L) 06/30/2023 0823   BILITOT 0.8 06/30/2023 0823   BILITOT <0.2 02/25/2017 1103   GFRNONAA 6 (L) 08/30/2023 1004   GFRAA 9 (L) 12/09/2019 0742    Imaging: DG chest 2 view IMPRESSION: *Mild diffuse increased alveolar and interstitial markings with bilateral hilar predominance, favoring pulmonary edema versus atypical pneumonia. Correlate clinically.  EKG: personally reviewed my interpretation is sinus rate/sinus rhythm, prolonged PR interval 230, QTc 42, compared to prior EKG.  ASSESSMENT & PLAN:   Assessment & Plan by Problem: Principal Problem:   Acute heart failure with preserved ejection fraction (HFpEF, >= 50%) (HCC) Active Problems:   Anemia   ESRD (end stage renal disease) (HCC)   Seizure disorder (HCC)   Acute respiratory failure with hypoxia (HCC)   Secondary hyperparathyroidism of renal origin (HCC)   Madeline Brown is a 48 y.o. person living with a history of hypertension, history of CVA, HFpEF, end-stage renal disease on hemodialysis who presented with dyspnea and admitted for acute hypoxic respiratory failure secondary to HFpEF exacerbation on hospital day 0  Acute Hypoxic Respiratory failure HFpEF [60-65%], mild exacerbation PHTN Presented with symptoms of shortness of breath this morning at work, was found to be hypoxic by EMS saturating at 88%, placed on 2 L oxygen.  Denies any recent sickness, sick contacts, any symptoms of congestion, sore throat, fevers.  Reports on and off dry cough, that is not worsening.  Denies any lower extremity edema bilaterally.  Reports her dry weight is about  56 kg, noted to be 56 kg yesterday after dialysis, was noted 59 kg today.  Very compliant on her medications.  Does report missing 1 day of dialysis last week on Thursday however did go to her Saturday session of dialysis.  RVP negative.  Chest x-ray showed increased alveolar and interstitial markings favoring pulmonary edema.  Per evaluation,  warm extremities, euvolemic on exam, no lower extremity edema, lungs clear to auscultation bilaterally, low suspicion for infectious process such as CAP. She is requiring 2L oxygen. Appears to have mild HF exacerbation. She is status post IV Lasix 40 mg in the ED. -Continue home GDMT medications: Coreg 25 mg twice daily, hydralazine 100 mg twice daily, spironolactone 50 mg. -Strict I's and O's, daily weights -Wean O2 as patient tolerates wit goal O2 > 90% -Check pulse ox while ambulating -PT eval  ESRD Hyperphosphatemia Patient reports that she missed her dialysis session on Thursday due to family issues, was able to go to following Saturday with no concerns.  Chest x-ray finding consistent with pulmonary vascular congestion, clinically no lower extremity edema, JVD, appears euvolemic on exam.  Nephrology is consulted in the ED.  Status post IV Lasix 40 mg. -Possible HD tonight with UF 3L as tolerated per nephro -Continue home medication calcitriol -Continue home medication Cinnasil at 30 mg daily -Continue home medication Dialyvite -Continue home medication Renvela 1600-2400 3 times daily with meals -Continue home medication Velphoro 500 mg 3 times daily -Strict I's and O's, daily weights, renal diet/fluid restrictions  HTN Continue home medication amlodipine 10 mg.   Macrocytic Anemia Thrombocytopenia  Anemia of Chronic Kidney Disease  Hgb 9.8, MCV 106, PLT 93. No acute signs of overt bleed. Stable.  - Monitor CBC   Diet: Renal VTE: Heparin IVF: None,None Code: Full  Prior to Admission Living Arrangement: Home, living boyfriend Anticipated  Discharge Location: Home Barriers to Discharge: Medical Management  Dispo: Admit patient to Observation with expected length of stay less than 2 midnights.  Signed: Jeral Pinch, DO Internal Medicine Resident PGY-1  08/30/2023, 1:44 PM

## 2023-08-30 NOTE — Consult Note (Signed)
 [facility] Renal Consultation Note    Indication for Consultation:  Management of ESRD/hemodialysis, anemia, hypertension/volume, and secondary hyperparathyroidism.  HPI: Madeline Brown is a 48 y.o. female with PMH including ESRD on dialysis TTS, HTN, CHF, and pericardial effusion who presents to the ED with shortness of breath. She reports she missed dialysis Thursday but did attend Saturday. Today while working she experienced abrupt onset shortness of breath starting around 8:30AM. She reports she did eat two slices of salty pizza last night. She is not sure if she drank more than usual. Reports she has also been coughing and has had mild congestion lately but denies CP, palpitations, dizziness, edema, abdominal pain, N/V/D, fever, chills. No urinary changes including dysuria, hematuria, fever, flank pain, chills. No other concerns reported except the shortness of breath. On arrival O2 sat was 88% on RA. Labs notable for K+ 4.1, BUN 44, Cr 8.26, Phos 7.9, WBC 5.0, Hgb 9.8, Plt 93. CXR showing mild diffuse interstitial markings favoring pulmonary edema. Currently comfortable on O2 via [place].   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   History of blood transfusion 07/2020   Hypertension    Intracranial hemorrhage (HCC)    LVH (left ventricular hypertrophy)    Pericardial effusion    Pneumonia    Pulmonary hypertension (HCC)    Seizure (HCC) 02/2019   last one was in 02/2019   Shortness of breath 07/15/2018   on 3L via [place] prn   Stroke (HCC)    Mild was told after having a seizure   Substance abuse (HCC)    Marijuana   Thrombocytopenia (HCC)    Past Surgical History:  Procedure Laterality Date   AV FISTULA PLACEMENT Left 06/24/2017   Procedure: CREATION OF LEFT ARM BRACHIALCEPHALIC  ARTERIOVENOUS (AV) FISTULA;  Surgeon: Larina Earthly, MD;  Location: [facility];  Service: Vascular;  Laterality:  Left;   AV FISTULA PLACEMENT Right 10/20/2022   Procedure: INSERTION OF RIGHT ARM ARTERIOVENOUS (AV) GORE-TEX GRAFT;  Surgeon: Leonie Douglas, MD;  [facility]: [facility];  Service: Vascular;  Laterality: Right;   CARDIAC CATHETERIZATION     CESAREAN SECTION     x2   COMPLEX WOUND CLOSURE Left 07/30/2020   Procedure: COMPLEX WOUND CLOSURE;  Surgeon: Leonie Douglas, MD;  [facility]: [facility];  Service: Vascular;  Laterality: Left;   FISTULA SUPERFICIALIZATION Left 10/28/2017   Procedure: SUPERFICIALIZATION LEFT BRACHIOCEPHALIC ARTERIOVENOUS FISTULA;  Surgeon: Larina Earthly, MD;  [facility];  Service: Vascular;  Laterality: Left;   INGUINAL HERNIA REPAIR Bilateral 02/01/2021   Procedure: LAPAROSCOPIC BILATERAL INGUINAL HERNIA REPAIR WITH MESH;  Surgeon: Kinsinger, De Blanch, MD;  Location: [facility];  Service: General;  Laterality: Bilateral;   INSERTION OF DIALYSIS CATHETER Right 06/24/2017   Procedure: INSERTION OF TUNNELED DIALYSIS CATHETER;  Surgeon: Larina Earthly, MD;  [facility]: [facility];  Service: Vascular;  Laterality: Right;   INSERTION OF DIALYSIS CATHETER Right 07/30/2020   Procedure: INSERTION OF DIALYSIS CATHETER;  Surgeon: Leonie Douglas, MD;  [facility]: [facility];  Service: Vascular;  Laterality: Right;   INSERTION OF DIALYSIS CATHETER Right 08/06/2022   Procedure: INSERTION OF TUNNELED DIALYSIS CATHETER;  Surgeon: Leonie Douglas, MD;  [facility];  Service: Vascular;  Laterality: Right;   LIGATION OF COMPETING BRANCHES OF ARTERIOVENOUS FISTULA  10/28/2017   Procedure: LIGATION OF COMPETING BRANCHES OF  ARTERIOVENOUS FISTULA x3;  Surgeon: Larina Earthly, MD;  Location: [facility] OR;  Service: Vascular;;   REVISON OF ARTERIOVENOUS FISTULA Left 07/30/2020   Procedure: LEFT UPPER EXTREMITY ARTERIOVENOUS FISTULA REVISON;  Surgeon: Leonie Douglas, MD;  [facility]: [facility];  Service: Vascular;  Laterality: Left;  PERIPHERAL NERVE BLOCK   REVISON OF ARTERIOVENOUS FISTULA Left 08/06/2022   Procedure: LEFT  UPPER EXTREMITY FISTULA LIGATION;  Surgeon: Leonie Douglas, MD;  [facility];  Service: Vascular;  Laterality: Left;   RIGHT HEART CATH N/A 07/09/2022   Procedure: RIGHT HEART CATH;  Surgeon: Dorthula Nettles, DO;  [facility];  Service: Cardiovascular;  Laterality: N/A;   Family History  Problem Relation Age of Onset   Diabetes Father    Hypertension Father    Cancer Father    Heart disease Father    Aneurysm Mother    Seizures Mother    Social History:  reports that she has quit smoking. Her smoking use included cigarettes. She started smoking about 29 years ago. She has a 7.3 pack-year smoking history. She has been exposed to tobacco smoke. She has never used smokeless tobacco. She reports that she does not currently use alcohol. She reports current drug use. Drug: Marijuana.  ROS: As per HPI otherwise negative.   Physical Exam: Vitals:   08/30/23 1000 08/30/23 1036 08/30/23 1043 08/30/23 1100  BP: (!) 162/113  (!) 156/108   Pulse: 92 98 88 87  Resp: (!) 29 14 (!) 28 20  Temp:      SpO2: 91% (!) 81% 96% 96%  Weight:      Height:         General: Well developed, well nourished, in no acute distress. Head: Normocephalic, atraumatic, sclera non-icteric, mucus membranes are moist. Lungs: + rhonchi bilateral lower lobes. Breathing is unlabored on O2 via [place]. Heart: RRR with normal S1, S2. No murmurs, rubs, or gallops appreciated. Abdomen: Soft, non-tender, non-distended with normoactive bowel sounds.  No obvious abdominal masses. Musculoskeletal:  Strength and tone appear normal for age. Lower extremities: trace edema bilateral lower extremities Neuro: Alert and oriented X 3. Moves all extremities spontaneously. Psych:  Responds to questions appropriately with a normal affect. Dialysis Access: AVG +t/b  Allergies  Allergen Reactions   Glycerin-Hypromellose-Peg 400 Swelling   Visine [Tetrahydrozoline Hcl] Swelling and Other (See Comments)    Eyes Swell    Prior to Admission medications   Medication Sig Start Date End Date Taking? Authorizing Provider  acetaminophen (TYLENOL) 500 MG tablet Take 500-1,000 mg by mouth every 6 (six) hours as needed for mild pain or headache.    [provider]  amLODipine (NORVASC) 10 MG tablet Take 1 tablet (10 mg total) by mouth at bedtime. 12/09/19 07/03/23  Eliezer Bottom, MD  B Complex-C-Folic Acid (DIALYVITE TABLET) TABS Take 1 tablet by mouth daily.    [provider]  CALCITRIOL PO Take by mouth daily.    [provider]  carvedilol (COREG) 25 MG tablet Take 1 tablet (25 mg total) by mouth 2 (two) times daily with a meal. Take 25 mg by mouth at 6:30 AM and 6:00 PM on Sun/Mon/Wed/Fri and 25 mg at 11:00 AM and 8:00 PM on Tues/Thurs/Sat 08/06/22 07/03/23  Dara Lords, PA-C  cinacalcet (SENSIPAR) 30 MG tablet Take 30 mg by mouth daily.    [provider]  Doxercalciferol (HECTOROL IV) Doxercalciferol (Hectorol) 09/09/22 09/08/23  [provider]  hydrALAZINE (APRESOLINE) 100 MG tablet Take 1  tablet (100 mg total) by mouth 3 (three) times daily. 12/09/19 07/03/23  Eliezer Bottom, MD  levETIRAcetam (KEPPRA) 1000 MG tablet Take 1,000 mg by mouth daily.    [provider]  Methoxy PEG-Epoetin Beta (MIRCERA IJ) Mircera 06/16/23 06/14/24  [provider]  oxyCODONE-acetaminophen (PERCOCET/ROXICET) 5-325 MG tablet Take 1 tablet by mouth every 6 (six) hours as needed for severe pain (pain score 7-10). 07/01/23   Linwood Dibbles, MD  sevelamer carbonate (RENVELA) 800 MG tablet Take 1,600-2,400 mg by mouth 3 (three) times daily with meals. 800 mg with snack twice a day 02/10/23   [provider]  spironolactone (ALDACTONE) 50 MG tablet Take 1 tablet (50 mg total) by mouth daily. 12/09/19   Eliezer Bottom, MD   Current Facility-Administered Medications  Medication Dose Route Frequency Provider Last Rate Last Admin   furosemide (LASIX) injection 40 mg  40 mg  Intravenous Once Trifan, Kermit Balo, MD       sodium chloride flush (NS) 0.9 % injection 3 mL  3 mL Intravenous Q12H Wendall Stade, MD       Current Outpatient Medications  Medication Sig Dispense Refill   acetaminophen (TYLENOL) 500 MG tablet Take 500-1,000 mg by mouth every 6 (six) hours as needed for mild pain or headache.     amLODipine (NORVASC) 10 MG tablet Take 1 tablet (10 mg total) by mouth at bedtime. 30 tablet 0   B Complex-C-Folic Acid (DIALYVITE TABLET) TABS Take 1 tablet by mouth daily.     CALCITRIOL PO Take by mouth daily.     carvedilol (COREG) 25 MG tablet Take 1 tablet (25 mg total) by mouth 2 (two) times daily with a meal. Take 25 mg by mouth at 6:30 AM and 6:00 PM on Sun/Mon/Wed/Fri and 25 mg at 11:00 AM and 8:00 PM on Tues/Thurs/Sat     cinacalcet (SENSIPAR) 30 MG tablet Take 30 mg by mouth daily.     Doxercalciferol (HECTOROL IV) Doxercalciferol (Hectorol)     hydrALAZINE (APRESOLINE) 100 MG tablet Take 1 tablet (100 mg total) by mouth 3 (three) times daily. 90 tablet 0   levETIRAcetam (KEPPRA) 1000 MG tablet Take 1,000 mg by mouth daily.     Methoxy PEG-Epoetin Beta (MIRCERA IJ) Mircera     oxyCODONE-acetaminophen (PERCOCET/ROXICET) 5-325 MG tablet Take 1 tablet by mouth every 6 (six) hours as needed for severe pain (pain score 7-10). 10 tablet 0   sevelamer carbonate (RENVELA) 800 MG tablet Take 1,600-2,400 mg by mouth 3 (three) times daily with meals. 800 mg with snack twice a day     spironolactone (ALDACTONE) 50 MG tablet Take 1 tablet (50 mg total) by mouth daily. 30 tablet 0   Labs: Basic Metabolic Panel: Recent Labs  Lab 08/30/23 1004  NA 136  K 4.1  CL 95*  CO2 23  GLUCOSE 89  BUN 44*  CREATININE 8.26*  CALCIUM 9.6  PHOS 7.9*   Liver Function Tests: No results for input(s): "AST", "ALT", "ALKPHOS", "BILITOT", "PROT", "ALBUMIN" in the last 168 hours. No results for input(s): "LIPASE", "AMYLASE" in the last 168 hours. No results for input(s):  "AMMONIA" in the last 168 hours. CBC: Recent Labs  Lab 08/30/23 1004  WBC 5.0  HGB 9.8*  HCT 29.8*  MCV 106.0*  PLT 93*   Cardiac Enzymes: No results for input(s): "CKTOTAL", "CKMB", "CKMBINDEX", "TROPONINI" in the last 168 hours. CBG: No results for input(s): "GLUCAP" in the last 168 hours. Iron Studies: No results for input(s): "IRON", "  TIBC", "TRANSFERRIN", "FERRITIN" in the last 72 hours. Studies/Results: DG Chest 2 View Result Date: 08/30/2023 CLINICAL DATA:  Shortness of breath. EXAM: CHEST - 2 VIEW COMPARISON:  06/30/2023. FINDINGS: Mild diffuse increased alveolar and interstitial markings with bilateral hilar predominance. There are probable atelectatic changes in the left retrocardiac region. Bilateral lung fields are otherwise clear. No dense consolidation or lung collapse. Bilateral costophrenic angles are clear. Stable cardio-mediastinal silhouette. No acute osseous abnormalities. The soft tissues are within normal limits. IMPRESSION: *Mild diffuse increased alveolar and interstitial markings with bilateral hilar predominance, favoring pulmonary edema versus atypical pneumonia. Correlate clinically. Electronically Signed   By: Jules Schick M.D.   On: 08/30/2023 10:53    Dialysis Orders:  Outpatient dialysis orders: [facility] TTS 160NRe 3 hr BFR 350 DFR Auto 1.5 EDW 55kg 2K 2Ca AVG 16g  No heparin Mircera IV q 2 weeks- last dose 08/18/23 Venofer 50mg  weekly Calcitriol 1.58mcg PO q HD Left HD on 08/29/23 0.3 over EDW  Assessment/Plan:  Shortness of breath: CXR consistent with pulmonary edema. She did leave close to her EDW on Saturday but is now 4kg over. Will plan for HD tonight with UF 3L as tolerated. Reinforced fluid and sodium restrictions  ESRD:  On TTS schedule, extra HD today, see above.   Hypertension: BP elevated, likely volume driven. Resume home BP meds (amlodpine, carvedilol, spironolactone)  Anemia: Hgb 9.8, not due for ESA yet  Metabolic  bone disease: Phos elevated. Calcium controlled. Continue VDRA, phos binders, and renal diet.   Nutrition:  renal diet/fluid restrictions  Rogers Blocker, PA-C 08/30/2023, 11:48 AM  [facility] Pager: [phone]

## 2023-08-30 NOTE — ED Notes (Signed)
 O2 sat 81% on room air. Placed on O2 @ 2L via [place].

## 2023-08-31 ENCOUNTER — Other Ambulatory Visit (HOSPITAL_COMMUNITY): Payer: Self-pay

## 2023-08-31 DIAGNOSIS — J810 Acute pulmonary edema: Secondary | ICD-10-CM | POA: Diagnosis not present

## 2023-08-31 DIAGNOSIS — Z992 Dependence on renal dialysis: Secondary | ICD-10-CM

## 2023-08-31 DIAGNOSIS — J9601 Acute respiratory failure with hypoxia: Secondary | ICD-10-CM | POA: Diagnosis not present

## 2023-08-31 DIAGNOSIS — I5031 Acute diastolic (congestive) heart failure: Secondary | ICD-10-CM

## 2023-08-31 DIAGNOSIS — N186 End stage renal disease: Principal | ICD-10-CM

## 2023-08-31 LAB — CBC
HCT: 29.1 % — ABNORMAL LOW (ref 36.0–46.0)
Hemoglobin: 9.7 g/dL — ABNORMAL LOW (ref 12.0–15.0)
MCH: 34.3 pg — ABNORMAL HIGH (ref 26.0–34.0)
MCHC: 33.3 g/dL (ref 30.0–36.0)
MCV: 102.8 fL — ABNORMAL HIGH (ref 80.0–100.0)
Platelets: 90 10*3/uL — ABNORMAL LOW (ref 150–400)
RBC: 2.83 MIL/uL — ABNORMAL LOW (ref 3.87–5.11)
RDW: 15.8 % — ABNORMAL HIGH (ref 11.5–15.5)
WBC: 4.7 10*3/uL (ref 4.0–10.5)
nRBC: 0 % (ref 0.0–0.2)

## 2023-08-31 LAB — RENAL FUNCTION PANEL
Albumin: 3.2 g/dL — ABNORMAL LOW (ref 3.5–5.0)
Anion gap: 10 (ref 5–15)
BUN: 20 mg/dL (ref 6–20)
CO2: 28 mmol/L (ref 22–32)
Calcium: 8.9 mg/dL (ref 8.9–10.3)
Chloride: 97 mmol/L — ABNORMAL LOW (ref 98–111)
Creatinine, Ser: 4.11 mg/dL — ABNORMAL HIGH (ref 0.44–1.00)
GFR, Estimated: 13 mL/min — ABNORMAL LOW (ref >60)
Glucose, Bld: 85 mg/dL (ref 70–99)
Phosphorus: 2.4 mg/dL — ABNORMAL LOW (ref 2.5–4.6)
Potassium: 3.6 mmol/L (ref 3.5–5.1)
Sodium: 135 mmol/L (ref 135–145)

## 2023-08-31 LAB — GLUCOSE, CAPILLARY: Glucose-Capillary: 87 mg/dL (ref 70–99)

## 2023-08-31 NOTE — Care Management Obs Status (Signed)
 MEDICARE OBSERVATION STATUS NOTIFICATION   Patient Details  Name: Madeline Brown MRN: [id number] Date of Birth: [birth year]   Medicare Observation Status Notification Given:  Yes    Alesia Richards, RN 08/31/2023, 10:59 AM

## 2023-08-31 NOTE — Evaluation (Signed)
 Physical Therapy Brief Evaluation and Discharge Note Patient Details Name: Madeline Brown MRN: [id number] DOB: 09/22/1975 Today's Date: 08/31/2023   History of Present Illness  48 year old female presents to ED via EMS from work 3/16 with c/o SoB, found to be hypoxic, SpO2 88%SpO2 on RA. Placed on 2L O2 via [place]. Chest x-ray finding consistent with pulmonary vascular congestion admitted for observation WUJ:[id number], history of CVA, HFpEF, end-stage renal disease on hemodialysis on TThS  Clinical Impression  PTA pt living with significant other and 3 daughters, in town home with 5 steps to enter and 13 steps to bed and bath. Pt completely independent and working at [employer]. Discussed implementation of  regular exercise program and pt in agreement. Pt has no further PT needs. PT signing off/      PT Assessment Patient does not need any further PT services  Assistance Needed at Discharge  None    Equipment Recommendations None recommended by PT     Precautions/Restrictions Precautions Precautions: None Restrictions Weight Bearing Restrictions Per Provider Order: No        Mobility  Bed Mobility       General bed mobility comments: sitting EoB  Transfers Overall transfer level: Independent                      Ambulation/Gait Ambulation/Gait assistance: Independent Gait Distance (Feet): 250 Feet   Gait Pattern/deviations: WFL(Within Functional Limits), Step-through pattern   General Gait Details: strong, steady gait  Home Activity Instructions    Stairs Stairs: Yes Stairs assistance: Independent Stair Management: One rail Right, Alternating pattern, No rails Number of Stairs: 15 General stair comments: strong, steady descent of steps with rail on R, strong, steady ascent with no use of rails       Balance Overall balance assessment: Independent                        Pertinent Vitals/Pain   Pain Assessment Pain Assessment:  No/denies pain     Home Living Family/patient expects to be discharged to:: Private residence Living Arrangements: Spouse/significant other Available Help at Discharge: Available PRN/intermittently Home Environment: Stairs to enter  [employer] of Steps: 5 Home Equipment: Grab bars - tub/shower;Hand held shower head   Additional Comments: lives with 3 daughters and boyfriend    Prior Function Level of Independence: Independent Comments: works at [employer]    UE/LE Assessment   UE ROM/Strength/Tone/Coordination: [employer]    LE ROM/Strength/Tone/Coordination: [facility]      Communication   Communication Communication: No apparent difficulties     Cognition Overall Cognitive Status: Appears within functional limits for tasks assessed/performed       General Comments General comments (skin integrity, edema, etc.): Ambulates on RA, SpO2 drops to 88%O2 with stair training but is able to rebound to 90%SpO2 with a few rounds of purse lip breathing      No Skilled PT All education completed;Patient at baseline level of functioning;Patient is independent with all acitivity/mobility;Patient will have necessary level of assist by caregiver at discharge    AMPAC 6 Clicks Help needed turning from your back to your side while in a flat bed without using bedrails?: None Help needed moving from lying on your back to sitting on the side of a flat bed without using bedrails?: None Help needed moving to and from a bed to a chair (including a wheelchair)?: None Help needed standing up from a chair using your arms (e.g., wheelchair or bedside chair)?:  None Help needed to walk in hospital room?: None Help needed climbing 3-5 steps with a railing? : None 6 Click Score: 24      End of Session Equipment Utilized During Treatment: Gait belt Activity Tolerance: Patient tolerated treatment well Patient left: in bed;with call bell/phone within reach Nurse Communication: Mobility status        Time: 5732-2025 PT Time Calculation (min) (ACUTE ONLY): 16 min  Charges:   PT Evaluation $PT Eval Low Complexity: 1 Low      Liticia Gasior B. Beverely Risen PT, DPT Acute Rehabilitation Services Please use secure chat or  Call Office [phone]   Elon Alas [facility]  08/31/2023, 10:22 AM

## 2023-08-31 NOTE — Progress Notes (Signed)
 Advised by RN CM of pt's d/c today. Contacted [facility] to be advised of pt's d/c today and that pt should resume care tomorrow.   Olivia Canter Renal Navigator [phone]

## 2023-08-31 NOTE — Progress Notes (Signed)
 Heart Failure Navigator Progress Note  Assessed for Heart & Vascular TOC clinic readiness.  Patient does not meet criteria due to ESRD on hemodialysis.   Navigator will sign off at this time.   Rhae Hammock, BSN, Scientist, clinical (histocompatibility and immunogenetics) Only

## 2023-08-31 NOTE — Discharge Summary (Incomplete)
 Name: Madeline Brown MRN: [id number] DOB: [birth year] 48 y.o. PCP: Madeline Brown., FNP  Date of Admission: 08/30/2023  9:52 AM Date of Discharge:  08/31/2023 11:38 AM  Attending Physician: Dr.  Sol Blazing  DISCHARGE DIAGNOSIS:  Primary Problem: Acute heart failure with preserved ejection fraction (HFpEF, >= 50%) [facility])   Hospital Problems: Principal Problem:   Acute heart failure with preserved ejection fraction (HFpEF, >= 50%) (HCC) Active Problems:   Anemia   ESRD (end stage renal disease) (HCC)   Seizure disorder (HCC)   Acute respiratory failure with hypoxia (HCC)   Secondary hyperparathyroidism of renal origin (HCC)    DISCHARGE MEDICATIONS:   Allergies as of 08/31/2023       Reactions   Glycerin-hypromellose-peg 400 Swelling   Visine [tetrahydrozoline Hcl] Swelling, Other (See Comments)   Eyes Swell        Medication List     STOP taking these medications    oxyCODONE-acetaminophen 5-325 MG tablet Commonly known as: PERCOCET/ROXICET       TAKE these medications    acetaminophen 500 MG tablet Commonly known as: TYLENOL Take 500-1,000 mg by mouth every 6 (six) hours as needed for mild pain or headache.   amLODipine 10 MG tablet Commonly known as: NORVASC Take 1 tablet (10 mg total) by mouth at bedtime.   CALCITRIOL PO Take 1 tablet by mouth.   carvedilol 25 MG tablet Commonly known as: COREG Take 1 tablet (25 mg total) by mouth 2 (two) times daily with a meal. Take 25 mg by mouth at 6:30 AM and 6:00 PM on Sun/Mon/Wed/Fri and 25 mg at 11:00 AM and 8:00 PM on Tues/Thurs/Sat   cinacalcet 30 MG tablet Commonly known as: SENSIPAR Take 30 mg by mouth daily.   DIALYVITE TABLET Tabs Take 1 tablet by mouth daily.   HECTOROL IV Doxercalciferol (Hectorol)   hydrALAZINE 100 MG tablet Commonly known as: APRESOLINE Take 1 tablet (100 mg total) by mouth 3 (three) times daily.   levETIRAcetam 1000 MG tablet Commonly known as: KEPPRA Take 1,000 mg by mouth  daily.   MIRCERA IJ Mircera   sevelamer carbonate 800 MG tablet Commonly known as: RENVELA Take 1,600-2,400 mg by mouth 3 (three) times daily with meals. 800 mg with snack twice a day   spironolactone 50 MG tablet Commonly known as: ALDACTONE Take 1 tablet (50 mg total) by mouth daily.   Velphoro 500 MG chewable tablet Generic drug: sucroferric oxyhydroxide Chew 500 mg by mouth 3 (three) times daily.        DISPOSITION AND FOLLOW-UP:  [name] was discharged from [facility] in {DISCHARGE CONDITION:19696} condition. At the hospital follow up visit please address:  Acute hypoxic respiratory failure/HFpEF mild exacerbation/pulmonary hypertension: Patient is discharged with her home medication GDMT medications: Coreg 25 mg twice daily, hydralazine 100 mg TID, spironolactone 50 mg daily. No changes were made.  ESRD on Tuesdays Thursdays Saturday: Patient is discharged with her home medications calcitriol, Cinacelet, Dialyvite, Renvela, Velphoro.  HTN: Patient is discharged with her home medication amlodipine 10 mg Macrocytic anemia/thrombocytopenia: Hemoglobin has been stable during this hospitalization, please recheck CBC  Follow-up Recommendations: Consults: None Labs: Basic Metabolic Profile and CBC Studies: None Medications: No medication changes at this time.   Follow-up Appointments:   HOSPITAL COURSE:  Patient Summary: Madeline Brown is a 48 y.o. person living with a history of hypertension, history of CVA, HFpEF, end-stage renal disease on hemodialysis who presented with dyspnea and admitted for acute hypoxic  respiratory failure secondary to HFpEF exacerbation on hospital day 0   Acute Hypoxic Respiratory failure HFpEF [60-65%], mild exacerbation PHTN Presented with symptoms of shortness of breath this morning at work, was found to be hypoxic by EMS saturating at 88%, placed on 2 L oxygen.  Denies any recent sickness, sick contacts, any  symptoms of congestion, sore throat, fevers.  Reports on and off dry cough, that is not worsening.  Denies any lower extremity edema bilaterally.  Reports her dry weight is about 56 kg, noted to be 56 kg yesterday after dialysis, was noted 59 kg today.  Very compliant on her medications.  Does report missing 1 day of dialysis last week on Thursday however did go to her Saturday session of dialysis.  RVP negative.  Chest x-ray showed increased alveolar and interstitial markings favoring pulmonary edema, low suspicion for CAP.  She was requiring 2 L of oxygen, appeared to have mild heart failure exacerbation.  She is status post IV Lasix 40 mg in the ED.  She was started on her home medication GDMT Coreg, hydralazine, spironolactone.  Physical therapy orders were placed, she worked with therapy without having a drop in oxygen saturations.  Overall she is hemodynamically stable, denied any symptoms this morning.  Per evaluation, she was offered room air, denied any chest pain shortness of breath or abdominal pain.  ESRD Hyperphosphatemia Patient reports that she missed her dialysis session on Thursday due to family issues, was able to go to following Saturday with no concerns.  Chest x-ray finding consistent with pulmonary vascular congestion, clinically no lower extremity edema, JVD, appears euvolemic on exam.  Nephrology is consulted in the ED.  Status post IV Lasix 40 mg.  She received 1 hemodialysis session overnight on 3/16.  We continued her home medications.    HTN Continue home medication amlodipine 10 mg.    Macrocytic Anemia Thrombocytopenia  Anemia of Chronic Kidney Disease  Hgb 9.8, MCV 106, PLT 93. No acute signs of overt bleed. Stable.   DISCHARGE INSTRUCTIONS:    SUBJECTIVE:  Patient was seen in the hallway walking with the physical therapist without any acute concerns at this time.  During evaluation in the room, she denied any chest pain, shortness of breath, abdominal pain.  No  concerns this morning.  She reported that yesterday she was able to tolerate the dialysis session for about 2 and half to 3 hours, had to stop dialysis due to muscle cramping.  Discharge Vitals:   BP (!) 152/116 (BP Location: Left Wrist)   Pulse 98   Temp 98.5 F (36.9 C) (Oral)   Resp 16   Ht 5' (1.524 m)   Wt 56.3 kg   LMP 12/14/2017   SpO2 100%   BMI 24.24 kg/m   OBJECTIVE:  Physical Exam   General: Sitting in bed, no acute distress Cardiovascular: Regular rate Pulmonary: Breathing comfortably, no wheezing or crackles Abdomen: Soft, nontender, nondistended bowel sounds present MSK: Range of motion intact, no lower extremity edema bilaterally Neuro: Awake, alert, answering questions appropriately, no focal deficits  Pertinent Labs, Studies, and Procedures:     Latest Ref Rng & Units 08/31/2023    3:23 AM 08/30/2023   10:04 AM 06/30/2023    8:23 AM  CBC  WBC 4.0 - 10.5 K/uL 4.7  5.0  5.5   Hemoglobin 12.0 - 15.0 g/dL 9.7  9.8  16.1   Hematocrit 36.0 - 46.0 % 29.1  29.8  31.5   Platelets 150 - 400  K/uL 90  93  107        Latest Ref Rng & Units 08/31/2023    3:23 AM 08/30/2023   10:04 AM 06/30/2023    8:23 AM  CMP  Glucose 70 - 99 mg/dL 85  89  98   BUN 6 - 20 mg/dL 20  44  89   Creatinine 0.44 - 1.00 mg/dL 9.60  4.54  09.81   Sodium 135 - 145 mmol/L 135  136  135   Potassium 3.5 - 5.1 mmol/L 3.6  4.1  5.9   Chloride 98 - 111 mmol/L 97  95  99   CO2 22 - 32 mmol/L 28  23  20    Calcium 8.9 - 10.3 mg/dL 8.9  9.6  8.8   Total Protein 6.5 - 8.1 g/dL   7.1   Total Bilirubin 0.0 - 1.2 mg/dL   0.8   Alkaline Phos 38 - 126 U/L   35   AST 15 - 41 U/L   16   ALT 0 - 44 U/L   10     DG Chest 2 View Result Date: 08/30/2023 CLINICAL DATA:  Shortness of breath. EXAM: CHEST - 2 VIEW COMPARISON:  06/30/2023. FINDINGS: Mild diffuse increased alveolar and interstitial markings with bilateral hilar predominance. There are probable atelectatic changes in the left retrocardiac region.  Bilateral lung fields are otherwise clear. No dense consolidation or lung collapse. Bilateral costophrenic angles are clear. Stable cardio-mediastinal silhouette. No acute osseous abnormalities. The soft tissues are within normal limits. IMPRESSION: *Mild diffuse increased alveolar and interstitial markings with bilateral hilar predominance, favoring pulmonary edema versus atypical pneumonia. Correlate clinically. Electronically Signed   By: Jules Schick M.D.   On: 08/30/2023 10:53     Signed: Jeral Pinch, D.O.  Internal Medicine Resident, PGY-1 [facility]  Pager: [phone] 11:48 AM, 08/31/2023

## 2023-08-31 NOTE — Discharge Summary (Addendum)
 Name: Madeline Brown MRN: [id number] DOB: 1976-05-05 48 y.o. PCP: Raymon Mutton., FNP  Date of Admission: 08/30/2023  9:52 AM Date of Discharge:  08/31/2023 1:20 PM  Attending Physician: Dr.  Sol Blazing   DISCHARGE DIAGNOSIS:  Primary Problem: ESRD (end stage renal disease) [facility])   Hospital Problems: Principal Problem:   ESRD (end stage renal disease) (HCC) Active Problems:   Anemia   Hypertension   Seizure disorder (HCC)   Acute respiratory failure with hypoxia (HCC)   Secondary hyperparathyroidism of renal origin (HCC)   Acute heart failure with preserved ejection fraction (HFpEF, >= 50%) (HCC)    DISCHARGE MEDICATIONS:   Allergies as of 08/31/2023       Reactions   Glycerin-hypromellose-peg 400 Swelling   Visine [tetrahydrozoline Hcl] Swelling, Other (See Comments)   Eyes Swell        Medication List     STOP taking these medications    oxyCODONE-acetaminophen 5-325 MG tablet Commonly known as: PERCOCET/ROXICET       TAKE these medications    acetaminophen 500 MG tablet Commonly known as: TYLENOL Take 500-1,000 mg by mouth every 6 (six) hours as needed for mild pain or headache.   amLODipine 10 MG tablet Commonly known as: NORVASC Take 1 tablet (10 mg total) by mouth at bedtime.   CALCITRIOL PO Take 1 tablet by mouth.   carvedilol 25 MG tablet Commonly known as: COREG Take 1 tablet (25 mg total) by mouth 2 (two) times daily with a meal. Take 25 mg by mouth at 6:30 AM and 6:00 PM on Sun/Mon/Wed/Fri and 25 mg at 11:00 AM and 8:00 PM on Tues/Thurs/Sat   cinacalcet 30 MG tablet Commonly known as: SENSIPAR Take 30 mg by mouth daily.   DIALYVITE TABLET Tabs Take 1 tablet by mouth daily.   HECTOROL IV Doxercalciferol (Hectorol)   hydrALAZINE 100 MG tablet Commonly known as: APRESOLINE Take 1 tablet (100 mg total) by mouth 3 (three) times daily.   levETIRAcetam 1000 MG tablet Commonly known as: KEPPRA Take 1,000 mg by mouth daily.   MIRCERA  IJ Mircera   sevelamer carbonate 800 MG tablet Commonly known as: RENVELA Take 1,600-2,400 mg by mouth 3 (three) times daily with meals. 800 mg with snack twice a day   spironolactone 50 MG tablet Commonly known as: ALDACTONE Take 1 tablet (50 mg total) by mouth daily.   Velphoro 500 MG chewable tablet Generic drug: sucroferric oxyhydroxide Chew 500 mg by mouth 3 (three) times daily.        DISPOSITION AND FOLLOW-UP:  Madeline Brown was discharged from [facility] in Good condition. At the hospital follow up visit please address:   Acute hypoxic respiratory failure d/t volume overload iso HFpEF and ESRD/HTN: Patient is discharged on her home medication GDMT Coreg 25 mg twice daily, hydralazine 100 mg 3 times daily, spironolactone 50 mg daily, amlodipine 10 mg daily. ESRD: Patient is discharged on her home medications calcitriol, Renvela, Velphoro, Cinacelet, Dialyvite. No changes were made.  Macrocytic anemia/thrombocytopenia: Seems chronic, no acute blood loss. Please follow up outpatient for repeat CBC.  Seizure: Discharged on home Keppra 1000 mg daily   Follow-up Recommendations: Consults: None Labs: Basic Metabolic Profile and CBC Studies: None Medications: No home medication changes   Follow-up Appointments:  Follow-up Information     Raymon Mutton., FNP. Schedule an appointment as soon as possible for a visit.   Specialty: Family Medicine Why: For Hospital follow up Contact information: [address] [place] [address] [phone]                 HOSPITAL COURSE:  Patient Summary: Madeline Brown is a 48 y.o. person living with a history of hypertension, history of CVA, HFpEF, end-stage renal disease on hemodialysis who presented with dyspnea and admitted for acute hypoxic respiratory failure secondary to HFpEF exacerbation.   Acute Hypoxic Respiratory failure HFpEF [60-65%], mild exacerbation ESRD PHTN Presented with  symptoms of shortness of breath at work, was found to be hypoxic by EMS saturating at 88%, placed on 2 L oxygen.  Reports her dry weight is about 56 kg, noted to be 56 kg after dialysis on Saturday, was noted 59 kg on day of admission.  Very compliant on her medications.  Does report missing 1 day of dialysis last week on Thursday however did go to her Saturday session of dialysis.  RVP negative.  Chest x-ray showed increased alveolar and interstitial markings favoring pulmonary edema.  Per evaluation, warm extremities, euvolemic on exam, no lower extremity edema, lungs clear to auscultation bilaterally, low suspicion for infectious process such as CAP. Appears to mild volume overload in the setting of HFpEF and ESRD with missed HD. Improved with IV Lasix and HD. PT evaluated without any further recommendation.   ESRD Hyperphosphatemia Patient reports that she missed her dialysis session on Thursday due to family issues, was able to go to following Saturday with no concerns.  Chest x-ray finding consistent with pulmonary vascular congestion, clinically no lower extremity edema, JVD, appears euvolemic on exam.  Nephrology was consulted in the ED and received dialysis session overnight, had some LE cramping.  Status post IV Lasix 40 mg. Presently euvolemic on exam.  -Continue home medication calcitriol -Continue home medication cinacalcet 30 mg daily -Continue home medication Dialyvite -Continue home medication Renvela 1600-2400 3 times daily with meals -Continue home medication Velphoro 500 mg 3 times daily -Strict I's and O's, daily weights, renal diet/fluid restrictions   HTN Continue home medication amlodipine 10 mg.    Macrocytic Anemia Thrombocytopenia  Anemia of Chronic Kidney Disease  Hgb 9.8, MCV 106, PLT 93. No acute signs of overt bleed. Stable.   Seizure: continue home medication keppra   DISCHARGE INSTRUCTIONS:   Discharge Instructions     Diet - low sodium heart healthy    Complete by: As directed    Discharge instructions   Complete by: As directed    Madeline Brown,   You came to the hospital for feeling short of breath and fluids in the vessels of your lungs. We treated you with hemodialysis session and lasix to get that fluid out of you.   For your ESRD Continue your home medications as prescribed, no changes  -Continue calcitriol -Continue Cinacalcet 30 mg daily  -Continue Dialyvite tablet daily -You can take either sevelamer carbonate 800 MG tablet or Velphoro. The dialysis unit will take care of it when you them.    For your blood pressure - Continue home medication amlodipine 10 mg - Continue hydralazine 100 mg 3 times a day - Continue Spironolactone 50 mg daily  For your heart failure Continue your Coreg as prescribed - Take 25 mg by mouth at 6:30 AM and 6:00 PM on Sun/Mon/Wed/Fri - Take 25 mg at 11:00 AM and 8:00 PM on Tues/Thurs/Sat   For your seizures - Continue Keppra 1000 mg daily  If you have any of these following symptoms, please call us or seek care at an emergency department: -  Chest Pain -Difficulty Breathing -Worsening abdominal pain -Syncope (passing out) -Drooping of face -Slurred speech -Sudden weakness in your leg or arm -Fever -Chills -blood in the stool -dark black, sticky stool  If you have any questions or concerns, call our clinic at [phone] or after hours call [phone] and ask for the internal medicine resident on call.  I am glad you are feeling better. It was a pleasure taking care for you. I wish a good recovery and good health!   Dr. Jeral Pinch   Increase activity slowly   Complete by: As directed        SUBJECTIVE:  Patient was seen walking in the hallway with the therapist without oxygen via nasal cannula, reported overall doing well.  She was reevaluated in her room, she denied any chest pain, shortness of breath, abdominal pain.  Reported that she tolerated dialysis session well  yesterday had some lower extremity muscle cramping afterwards.  Otherwise no concerns this morning.  She is stable and off oxygen.  Discharge Vitals:   BP (!) 152/116 (BP Location: Left Wrist)   Pulse 98   Temp 98.5 F (36.9 C) (Oral)   Resp 16   Ht 5' (1.524 m)   Wt 56.3 kg   LMP 12/14/2017   SpO2 100%   BMI 24.24 kg/m   OBJECTIVE:  Physical Exam   General: Sitting beside bed, no acute distress Cardiovascular: Regular rate Pulmonary: Normal work of breathing, no wheezing no crackles Abdomen: Soft, nontender, nondistended MSK: Range of motion intact, no focal deficits, no lower extremity edema bilaterally Neuro: No focal deficits 19  Pertinent Labs, Studies, and Procedures:     Latest Ref Rng & Units 08/31/2023    3:23 AM 08/30/2023   10:04 AM 06/30/2023    8:23 AM  CBC  WBC 4.0 - 10.5 K/uL 4.7  5.0  5.5   Hemoglobin 12.0 - 15.0 g/dL 9.7  9.8  57.8   Hematocrit 36.0 - 46.0 % 29.1  29.8  31.5   Platelets 150 - 400 K/uL 90  93  107        Latest Ref Rng & Units 08/31/2023    3:23 AM 08/30/2023   10:04 AM 06/30/2023    8:23 AM  CMP  Glucose 70 - 99 mg/dL 85  89  98   BUN 6 - 20 mg/dL 20  44  89   Creatinine 0.44 - 1.00 mg/dL 4.69  6.29  52.84   Sodium 135 - 145 mmol/L 135  136  135   Potassium 3.5 - 5.1 mmol/L 3.6  4.1  5.9   Chloride 98 - 111 mmol/L 97  95  99   CO2 22 - 32 mmol/L 28  23  20    Calcium 8.9 - 10.3 mg/dL 8.9  9.6  8.8   Total Protein 6.5 - 8.1 g/dL   7.1   Total Bilirubin 0.0 - 1.2 mg/dL   0.8   Alkaline Phos 38 - 126 U/L   35   AST 15 - 41 U/L   16   ALT 0 - 44 U/L   10     DG Chest 2 View Result Date: 08/30/2023 CLINICAL DATA:  Shortness of breath. EXAM: CHEST - 2 VIEW COMPARISON:  06/30/2023. FINDINGS: Mild diffuse increased alveolar and interstitial markings with bilateral hilar predominance. There are probable atelectatic changes in the left retrocardiac region. Bilateral lung fields are otherwise clear. No dense consolidation or lung collapse.  Bilateral costophrenic angles are clear.  Stable cardio-mediastinal silhouette. No acute osseous abnormalities. The soft tissues are within normal limits. IMPRESSION: *Mild diffuse increased alveolar and interstitial markings with bilateral hilar predominance, favoring pulmonary edema versus atypical pneumonia. Correlate clinically. Electronically Signed   By: Jules Schick M.D.   On: 08/30/2023 10:53     Signed: Jeral Pinch, D.O.  Internal Medicine Resident, PGY-1 [facility]  Pager: [phone] 1:28 PM, 08/31/2023

## 2023-08-31 NOTE — TOC Transition Note (Signed)
 Transition of Care [facility]) - Discharge Note   Patient Details  Name: Madeline Brown MRN: [id number] Date of Birth: 1975-08-25  Transition [facility] Care [facility]) CM/SW Contact:  Alesia Richards, RN Phone Number: 08/31/2023, 1:30 PM   Clinical Narrative:    Discharge orders noted. Patient to discharge to home via patient's significant other, Douglas. Patient will resume Tuesday. Thursday, Saturday HD schedule beginning tomorrow 09/01/2023 at 0600.   Final next level of care: Home/Self Care (and resumption of outpatient HD services, [facility]) Barriers to Discharge: No Barriers Identified   Patient Goals and CMS Choice Patient states their goals for this hospitalization and ongoing recovery are:: to return home    Discharge Placement     Home/self care     Resumption of care services: Outpatient Hemodialysis: [facility] [address], [place].   Discharge Plan and Services Additional resources added to the After Visit Summary for     Discharge Planning Services: CM Consult Post Acute Care Choice:  (Resumption of outpatient HD services, [facility] [place], [address]               Social Drivers of Health (SDOH) Interventions SDOH Screenings   Food Insecurity: No Food Insecurity (08/30/2023)  Housing: Low Risk  (08/30/2023)  Transportation Needs: No Transportation Needs (08/30/2023)  Utilities: Not At Risk (08/30/2023)  Depression (PHQ2-9): Low Risk  (03/23/2019)  Financial Resource Strain: Low Risk  (11/03/2022)   Received from [facility], [facility]  Tobacco Use: Medium Risk (08/30/2023)     Readmission Risk Interventions    08/08/2022   11:18 AM  Readmission Risk Prevention Plan  Transportation Screening Complete  HRI or Home Care Consult Complete  Social Work Consult for Recovery Care Planning/Counseling Complete  Palliative Care Screening Not Applicable  Medication Review Furniture conservator/restorer) Complete

## 2023-08-31 NOTE — Discharge Planning (Signed)
 [facility] Patient Discharge Orders- [facility] CLINIC: [facility]  Patient's name: Madeline Brown Admit/DC Dates: 08/30/2023 - 08/31/2023  Discharge Diagnoses: Acute hypoxic respiratory failure 2nd volume overload/pulmonary edema    Aranesp: Given: No     Last Hgb: 9.7 PRBC's Given: No  ESA dose for discharge: Resume mircera 100 mcg IV q 2 weeks  IV Iron dose at discharge: Resume weekly Venofer  Heparin change: N/A  EDW Change: Yes New EDW: Lower to 54kg.   Bath Change: No  Access intervention/Change: No  Calcitriol change: No  Discharge Labs: Calcium 8.9 Phosphorus 2.4 Albumin 3.2 K+ 3.6  IV Antibiotics: No  On Coumadin?: No  For Renal Provider: Patient had a question regarding her binder regimen which she didn't inform me during rounds. Noted Velphoro and Renvela on DC summary. Please address her question at next routine rounds. Thank you   D/C Meds to be reconciled by nurse after every discharge.  Completed By: Salome Holmes, NP   Reviewed by: MD:______ RN_______

## 2023-08-31 NOTE — Progress Notes (Signed)
 [facility] Progress Note   Subjective:    Seen and examined patient at bedside. Received HD overnight and 2.1L was removed. Currently, she reports feeling better and wants to go home. Requested the bedside RN to obtain a standing weight today to determine whether her EDW needs to be adjusted. Plan for discharge today.  Objective Vitals:   08/31/23 0252 08/31/23 0342 08/31/23 0540 08/31/23 0835  BP: (!) 134/94 (!) 142/99  (!) 152/116  Pulse: 85 93 100 98  Resp: 16 20 16 16   Temp: 98.2 F (36.8 C) 99.3 F (37.4 C)  98.5 F (36.9 C)  TempSrc: Oral Oral  Oral  SpO2: 96% 93% 91% 100%  Weight:      Height:       Physical Exam General: Pleasant, on RA, NAD Heart: S1 and S2; No murmurs, gallops, or rubs Lungs: Clear throughout; No rales, wheezing, or rhonchi Abdomen: Soft and non-tender Extremities: No LE edema Dialysis Access: AVG (+) B/T   Filed Weights   08/30/23 0959 08/30/23 1619  Weight: 59 kg 56.3 kg    Intake/Output Summary (Last 24 hours) at 08/31/2023 1231 Last data filed at 08/31/2023 4010 Gross per 24 hour  Intake 480 ml  Output 2250 ml  Net -1770 ml    Additional Objective Labs: Basic Metabolic Panel: Recent Labs  Lab 08/30/23 1004 08/31/23 0323  NA 136 135  K 4.1 3.6  CL 95* 97*  CO2 23 28  GLUCOSE 89 85  BUN 44* 20  CREATININE 8.26* 4.11*  CALCIUM 9.6 8.9  PHOS 7.9* 2.4*   Liver Function Tests: Recent Labs  Lab 08/31/23 0323  ALBUMIN 3.2*   No results for input(s): "LIPASE", "AMYLASE" in the last 168 hours. CBC: Recent Labs  Lab 08/30/23 1004 08/31/23 0323  WBC 5.0 4.7  HGB 9.8* 9.7*  HCT 29.8* 29.1*  MCV 106.0* 102.8*  PLT 93* 90*   Blood Culture    Component Value Date/Time   SDES BLOOD RIGHT WRIST 08/07/2020 0044   SPECREQUEST  08/07/2020 0044    BOTTLES DRAWN AEROBIC AND ANAEROBIC Blood Culture adequate volume   CULT  08/07/2020 0044    NO GROWTH 5 DAYS Performed at [facility], [address] [place], [place] [address]    REPTSTATUS 08/12/2020 FINAL 08/07/2020 0044    Cardiac Enzymes: No results for input(s): "CKTOTAL", "CKMB", "CKMBINDEX", "TROPONINI" in the last 168 hours. CBG: Recent Labs  Lab 08/31/23 0346  GLUCAP 87   Iron Studies: No results for input(s): "IRON", "TIBC", "TRANSFERRIN", "FERRITIN" in the last 72 hours. Lab Results  Component Value Date   INR 1.14 06/24/2017   INR 1.07 06/19/2017   INR 1.07 02/13/2017   Studies/Results: DG Chest 2 View Result Date: 08/30/2023 CLINICAL DATA:  Shortness of breath. EXAM: CHEST - 2 VIEW COMPARISON:  06/30/2023. FINDINGS: Mild diffuse increased alveolar and interstitial markings with bilateral hilar predominance. There are probable atelectatic changes in the left retrocardiac region. Bilateral lung fields are otherwise clear. No dense consolidation or lung collapse. Bilateral costophrenic angles are clear. Stable cardio-mediastinal silhouette. No acute osseous abnormalities. The soft tissues are within normal limits. IMPRESSION: *Mild diffuse increased alveolar and interstitial markings with bilateral hilar predominance, favoring pulmonary edema versus atypical pneumonia. Correlate clinically. Electronically Signed   By: Jules Schick M.D.   On: 08/30/2023 10:53    Medications:   amLODipine  10 mg Oral QHS   B-complex with vitamin C  1 tablet Oral Daily  calcitRIOL  0.25 mcg Oral Daily   carvedilol  25 mg Oral 2 times per day   Chlorhexidine Gluconate Cloth  6 each Topical Q0600   cinacalcet  30 mg Oral Daily   heparin  5,000 Units Subcutaneous Q8H   hydrALAZINE  100 mg Oral BID   levETIRAcetam  1,000 mg Oral Daily   sevelamer carbonate  1,600-2,400 mg Oral TID WC   spironolactone  50 mg Oral Daily   sucroferric oxyhydroxide  500 mg Oral TID    Dialysis Orders: [place] TTS 160NRe 3 hr BFR 350 DFR Auto 1.5 EDW 55kg 2K 2Ca AVG 16g  No heparin Mircera IV q 2 weeks- last dose 08/18/23 Venofer  50mg  weekly Calcitriol 1.58mcg PO q HD Left HD on 08/29/23 0.3 over EDW  Assessment/Plan: Shortness of breath: CXR consistent with pulmonary edema. Missed HD 3/13 but went to treatment on Saturday. She did leave close to her EDW on Saturday but is now 4kg over. Noted she ate 2 slices of pizza over the weekend as well. Received HD overnight and 2.1L removed. Reinforced fluid and sodium restrictions ESRD:  On TTS schedule, received an extra HD overnight. Next HD 3/18 in outpatient. Requested bedside RN to obtain a standing weight before discharge, more likely will lower.  Hypertension: BP elevated, likely volume driven. Resume home BP meds (amlodpine, carvedilol, spironolactone) Anemia: Hgb 9.8, not due for ESA yet Metabolic bone disease: Phos elevated. Calcium controlled. Continue VDRA, phos binders, and renal diet.  Nutrition:  renal diet/fluid restrictions Dispo - Okay for discharge from a renal standpoint. Plan to sort out her binder regimen when she returns back to Denice Bors, NP [facility] 08/31/2023,12:31 PM  LOS: 0 days

## 2023-08-31 NOTE — Discharge Instructions (Signed)
 [name], Madeline Brown came to the hospital for feeling short of breath and fluids in the vessels of your lungs. We treated you with hemodialysis session and lasix to get that fluid out of you.   For your ESRD Continue your home medications as prescribed, no changes  -Continue calcitriol -Continue Cinacalcet 30 mg daily  -Continue Dialyvite tablet daily -You can take either sevelamer carbonate 800 MG tablet or Velphoro. The dialysis unit will take care of it when you them.    For your blood pressure - Continue home medication amlodipine 10 mg - Continue hydralazine 100 mg 3 times a day - Continue Spironolactone 50 mg daily  For your heart failure Continue your Coreg as prescribed - Take 25 mg by mouth at 6:30 AM and 6:00 PM on Sun/Mon/Wed/Fri - Take 25 mg at 11:00 AM and 8:00 PM on Tues/Thurs/Sat   For your seizures - Continue Keppra 1000 mg daily  If you have any of these following symptoms, please call us or seek care at an emergency department: -Chest Pain -Difficulty Breathing -Worsening abdominal pain -Syncope (passing out) -Drooping of face -Slurred speech -Sudden weakness in your leg or arm -Fever -Chills -blood in the stool -dark black, sticky stool  If you have any questions or concerns, call our clinic at [phone] or after hours call [phone] and ask for the internal medicine resident on call.  I am glad you are feeling better. It was a pleasure taking care for you. I wish a good recovery and good health!   Dr. Jeral Pinch

## 2023-08-31 NOTE — TOC Initial Note (Signed)
 Transition of Care [facility]) - Initial/Assessment Note    Patient Details  Name: Madeline Brown MRN: [id number] Date of Birth: November 26, 1975  Transition of Care [facility]) CM/SW Contact:    Alesia Richards, RN Phone Number: [phone] 08/31/2023, 11:09 AM  Clinical Narrative:                  CM to patient's room regarding TOC screening assessment. CM introduced case management role and discharge care planning process. Patient verbalized understanding and agreement to [facility] interview. Patient states lives with Mayo Ao, and 3 children, ages, 7, 67, and 13. Per patient, no pets in home. Per patient is active with [facility]. Per patient works part time for [employer].    Expected Discharge Plan: Home/Self Care (and resumption of outpatient HD services, [facility]) Barriers to Discharge: No Barriers Identified   Patient Goals and CMS Choice Patient states their goals for this hospitalization and ongoing recovery are:: to return home    Expected Discharge Plan and Services   Discharge Planning Services: CM Consult Post Acute Care Choice:  (Resumption of outpatient HD services, [facility] [place], [address] Living arrangements for the past 2 months: Apartment                   Prior Living Arrangements/Services Living arrangements for the past 2 months: Apartment Lives with:: Significant Other, Adult Children, Minor Children (Per patient comment lives with significant other, Riley Lam, and 3 children, ages, 73, 2, 57) Patient language and need for interpreter reviewed:: Yes (English, no interpreter needed) Do you feel safe going back to the place where you live?: Yes      Need for Family Participation in Patient Care: No (Comment) Care giver support system in place?: Yes (comment)   Criminal Activity/Legal Involvement Pertinent to Current Situation/Hospitalization: No - Comment as needed  Activities of Daily  Living   ADL Screening (condition at time of admission) Independently performs ADLs?: Yes (appropriate for developmental age) Is the patient deaf or have difficulty hearing?: No Does the patient have difficulty seeing, even when wearing glasses/contacts?: No Does the patient have difficulty concentrating, remembering, or making decisions?: No  Permission Sought/Granted    Emotional Assessment Appearance:: Appears stated age Attitude/Demeanor/Rapport: Engaged Affect (typically observed): Calm Orientation: : Oriented to Self, Oriented to Place, Oriented to  Time, Oriented to Situation Alcohol / Substance Use: Not Applicable (per patient last alcohol use was 2019; Per patient stopped smoking cigarettes 6 months ago. Per patient, last marijuana use was on Friday, 08/28/2023) Psych Involvement: No (comment)  Admission diagnosis:  Acute pulmonary edema (HCC) [J81.0] Acute heart failure with preserved ejection fraction (HFpEF, >= 50%) (HCC) [I50.31] Patient Active Problem List   Diagnosis Date Noted   Acute heart failure with preserved ejection fraction (HFpEF, >= 50%) (HCC) 08/30/2023   Acute exacerbation of CHF (congestive heart failure) (HCC) 06/07/2022   Pneumonia due to infectious organism    Acute on chronic diastolic CHF (congestive heart failure) (HCC) 08/18/2021   Pancytopenia (HCC) 07/29/2021   Volume overload 07/29/2021   Hypoxemia    Fluid overload 03/25/2021   Complication of vascular dialysis catheter 09/17/2020   Other mechanical complication of surgically created arteriovenous fistula, subsequent encounter 07/25/2020   Fluid overload, unspecified 12/09/2019   Acute respiratory failure with hypoxia (HCC) 12/08/2019   Hypertensive urgency 12/07/2019   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   Current smoker 11/28/2019   History of intracranial hemorrhage 11/28/2019   Transplanted  organ and tissue status, 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/15/2019   Intracerebral hemorrhage 02/21/2019   Intracranial  hematoma (HCC) 02/20/2019   Seizure disorder (HCC) 02/19/2019   Pain, unspecified 08/23/2018   SOB (shortness of breath) 07/15/2018   Pruritus, unspecified 10/28/2017   Headache, unspecified 09/26/2017   Anemia of chronic disease 06/30/2017   Coagulation defect, unspecified (HCC) 06/30/2017   Hypertensive chronic kidney disease with stage 1 through stage 4 chronic kidney disease, or unspecified chronic kidney disease 06/30/2017   Iron deficiency anemia, unspecified 06/30/2017   Encounter for fitting and adjustment of extracorporeal dialysis catheter (HCC) 06/30/2017   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 ([place]), [place] - [address] [place] [address] Phone: [phone] Fax: [phone]  [facility] [address] [place] [address] Phone: [phone] Fax: [phone]     Social Drivers of Health (SDOH) Social History: SDOH Screenings   Food Insecurity: No Food Insecurity (08/30/2023)  Housing: Low Risk  (08/30/2023)  Transportation Needs: No Transportation Needs (08/30/2023)  Utilities: Not At Risk (08/30/2023)  Depression (PHQ2-9): Low Risk  (03/23/2019)  Financial Resource Strain: Low Risk  (11/03/2022)   [facility] from [facility], [facility]  Tobacco Use: Medium Risk (08/30/2023)   SDOH Interventions:     Readmission Risk Interventions    08/08/2022   11:18 AM  Readmission Risk Prevention Plan  Transportation Screening Complete  HRI or Home Care Consult Complete  Social Work Consult for Recovery Care Planning/Counseling Complete  Palliative Care Screening Not Applicable  Medication Review Oceanographer) Complete

## 2023-09-01 LAB — HEPATITIS B SURFACE ANTIBODY, QUANTITATIVE: Hep B S AB Quant (Post): 1549 m[IU]/mL

## 2023-09-02 NOTE — TOC Transition Note (Signed)
 Transition of Care - Initial Contact from Inpatient Facility  Date of discharge: 08/31/23 Date of contact: 09/02/23 Method: Phone Spoke to: Patient  Patient contacted to discuss transition of care from recent inpatient hospitalization. Patient was admitted to [facility] from 08/30/23-08/31/23 with discharge diagnosis of respiratory failure 2nd pulmonary edema/volume overload.  The discharge medication list was reviewed. Patient understands the changes and has no concerns.   Patient received HD on 09/01/23. Next HD 09/03/23.  Salome Holmes, NP

## 2023-09-13 NOTE — Progress Notes (Signed)
 Patient needs PT on the basis that she had acute hypoxic resp failiure in the setting of missed dialysis. She needed to work with PT/OT to get her up and moving to help prevent further demise. She was short of breath with activity and needed instruction on how to navigate daily life with this.

## 2023-11-29 NOTE — Progress Notes (Deleted)
 Cardiology Office Note    Date:  11/29/2023  ID:  Madeline Brown, DOB August 23, 1975, MRN [id number] PCP:  Shannan Dart., FNP  Cardiologist:  Janelle Mediate, MD  Electrophysiologist:  None   Chief Complaint: ***  History of Present Illness: .    Madeline Brown is a 48 y.o. 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  Patient is followed by [facility] and [facility]? for possible kidney transplant.  Patient was previously seen by Dr. Winton Haws in for pericardial effusion from uremia/minoxidil  that resolved on subsequent TTE's.  She was hospitalized in 11/2019 with hypertensive emergency, ICH and seizures, BP meds were adjusted and she was started on Keppra .  She was hospitalized in 05/2022 with CHF with volume overload, CTA chest with no pneumonia or PE, improved with more dialysis.  Echocardiogram in 01/2022 indicated EF 60 to 65%, severe LVH, moderate biatrial enlargement with no significant valve disease.  She underwent right heart cath on 07/09/2022, confirmed high output CHF with CO greater than 8 L/min.  She underwent fistula ligation again with Dr. Edgardo Goodwill on 09/30/2022.  On 03/18/2023 she underwent MPI with [facility] health for transplant consideration, noted to have normal myocardial perfusion study, no evidence of any significant ischemia or scar, LV systolic function was normal.  Echocardiogram on 03/18/2023 indicated LV with normal size and normal wall thickness, LVEF greater than 55%, RV was normal in size with normal systolic function, there were no significant valvular abnormalities and no pericardial effusion.  Patient was admitted in 08/2023 with acute hypoxic respiratory failure.  Patient was found at work to be hypoxic with oxygen saturation at 88%.  It was noted that her dry  weight was around 56 kg and was noted to be 59 kg on day of admission.  She had reportedly missed 1 day of dialysis the week prior however did show up for her dialysis day prior to admission.  Today she presents for follow up. She reports that she    Pericardial effusion: Previously noted and felt likely related to uremia/minoxidil .  It had resolved on follow-up echocardiograms in 12/04/2019 and 02/02/2022.  Echo on 03/18/2023 showed no evidence of pericardial effusion.  HTN: Blood pressure today  ESRD:   Pulmonary HTN:   ICH:   Labwork independently reviewed:   ROS: .   *** denies chest pain, shortness of breath, lower extremity edema, fatigue, palpitations, melena, hematuria, hemoptysis, diaphoresis, weakness, presyncope, syncope, orthopnea, and PND.  All other systems are reviewed and otherwise negative.  Studies Reviewed: Aaron Aas    EKG:  EKG is ordered today, personally reviewed, demonstrating ***     CV Studies: Cardiac studies reviewed are outlined and summarized above. Otherwise please see EMR for full report. Cardiac Studies & Procedures   ______________________________________________________________________________________________ CARDIAC CATHETERIZATION  CARDIAC CATHETERIZATION 07/09/2022  Conclusion HEMODYNAMICS: RA:   2 mmHg (mean) RV:   59/1-5 mmHg PA:   61/22 mmHg (37 mean) PCWP:  5 mmHg (mean)  TPG    32  mmHg PVR     3.7 Wood Units PAPi      >10  Estimated Fick CO/CI   8.5 L/min, 5.9 L/min/m2 Thermodilution CO/CI  8.5 L/min, 5.9 L/min/m2  Patient was given heparin  3000U and blood pressure cuff was placed on AVF at 100-153mmHg for 1 minute. Her blood pressure during this time was 143/84  PA: 51/20 (30 mean) Estimated Fick CI: 5.5 L/min/m2 Thermodilution CO/CI 7.6 L/min, 5.29 L/min/m2  O2 sats: PA 74% IVC: 50% RA:  80% SVC: 81% AO: 92%  IMPRESSION: Low pre and post capillary filling pressures. Moderately elevated PA mean and PVR Hemodynamics  consistent with high output heart failure (CI >4, CO>8). After inflating BP cuff to 100-155mmHg there was a reduction in PA pressures. There was a mild reduction in cardiac output from a cardiac index of 5.9 L/min/m2 to 5.3 L/min/m2. Step up in SVC saturation & RA saturation are consistent with high output from AVF.  Aditya Sabharwal Advanced Heart Failure 10:49 AM     ECHOCARDIOGRAM  ECHOCARDIOGRAM COMPLETE 02/04/2022  Narrative ECHOCARDIOGRAM REPORT    Patient Name:   Madeline Brown Date of Exam: 02/04/2022 Medical Rec #:  [id number]      Height:       60.0 in Accession #:    [id number]     Weight:       111.7 lb Date of Birth:  09-09-1975       BSA:          1.457 m Patient Age:    46 years       BP:           110/77 mmHg Patient Gender: F              HR:           87 bpm. Exam Location:  Inpatient  Procedure: 2D Echo, Cardiac Doppler and Color Doppler  Indications:    Dyspnea  History:        Patient has prior history of Echocardiogram examinations, most recent 03/26/2021. Stroke, Signs/Symptoms:Shortness of Breath; Risk Factors:Hypertension.  Sonographer:    Paige Boatman Referring Phys: Rozena Cornish A HENSEL  IMPRESSIONS   1. Left ventricular ejection fraction, by estimation, is 60 to 65%. The left ventricle has normal function. The left ventricle has no regional wall motion abnormalities. There is severe left ventricular hypertrophy. Left ventricular diastolic parameters are indeterminate. 2. Right ventricular systolic function is normal. The right ventricular size is normal. Tricuspid regurgitation signal is inadequate for assessing PA pressure. 3. Left atrial size was moderately dilated. 4. Right atrial size was moderately dilated. 5. The mitral valve is normal in structure. No evidence of mitral valve regurgitation. 6. The aortic valve was not well visualized. Aortic valve regurgitation is not visualized. 7. The inferior vena cava is normal in size with greater  than 50% respiratory variability, suggesting right atrial pressure of 3 mmHg.  Comparison(s): No significant change from prior study.  FINDINGS Left Ventricle: Left ventricular ejection fraction, by estimation, is 60 to 65%. The left ventricle has normal function. The left ventricle has no regional wall motion abnormalities. The left ventricular internal cavity size was normal in size. There is severe left ventricular hypertrophy. Left ventricular diastolic parameters are indeterminate.  Right Ventricle: The right ventricular size is normal. Right ventricular systolic function is normal. Tricuspid regurgitation signal is inadequate for assessing PA pressure.  Left [facility]: Left atrial size was moderately dilated.  Right [facility]: Right atrial size was moderately dilated.  Pericardium: There is no evidence of pericardial effusion.  Mitral Valve: The mitral valve is normal in structure. No evidence of mitral valve regurgitation.  Tricuspid Valve: The tricuspid valve is normal in structure. Tricuspid valve regurgitation is not demonstrated.  Aortic Valve: The aortic valve was not well visualized. Aortic valve regurgitation is not visualized. Aortic valve mean gradient measures  7.0 mmHg. Aortic valve peak gradient measures 12.2 mmHg. Aortic valve area, by VTI measures 2.53 cm.  Pulmonic Valve: The pulmonic valve was normal in structure. Pulmonic valve regurgitation is not visualized.  Aorta: The aortic root and ascending aorta are structurally normal, with no evidence of dilitation.  Venous: The inferior vena cava is normal in size with greater than 50% respiratory variability, suggesting right atrial pressure of 3 mmHg.  IAS/Shunts: No atrial level shunt detected by color flow Doppler.   LEFT VENTRICLE PLAX 2D LVIDd:         4.70 cm   Diastology LVIDs:         2.60 cm   LV e' medial:    6.64 cm/s LV PW:         1.30 cm   LV E/e' medial:  10.1 LV IVS:        1.30 cm   LV e' lateral:    6.74 cm/s LVOT diam:     2.10 cm   LV E/e' lateral: 9.9 LV SV:         80 LV SV Index:   55 LVOT Area:     3.46 cm   RIGHT VENTRICLE RV S prime:     14.50 cm/s TAPSE (M-mode): 2.0 cm  LEFT ATRIUM             Index        RIGHT ATRIUM           Index LA diam:        3.40 cm 2.33 cm/m   RA Area:     17.70 cm LA Vol (A2C):   60.6 ml 41.58 ml/m  RA Volume:   45.20 ml  31.02 ml/m LA Vol (A4C):   80.7 ml 55.38 ml/m LA Biplane Vol: 72.3 ml 49.61 ml/m AORTIC VALVE AV Area (Vmax):    2.14 cm AV Area (Vmean):   2.03 cm AV Area (VTI):     2.53 cm AV Vmax:           175.00 cm/s AV Vmean:          121.000 cm/s AV VTI:            0.318 m AV Peak Grad:      12.2 mmHg AV Mean Grad:      7.0 mmHg LVOT Vmax:         108.00 cm/s LVOT Vmean:        71.000 cm/s LVOT VTI:          0.232 m LVOT/AV VTI ratio: 0.73  AORTA Ao Root diam: 3.00 cm Ao Asc diam:  3.40 cm  MITRAL VALVE MV Area (PHT): 4.49 cm     SHUNTS MV Decel Time: 169 msec     Systemic VTI:  0.23 m MV E velocity: 67.00 cm/s   Systemic Diam: 2.10 cm MV A velocity: 111.00 cm/s MV E/A ratio:  0.60  [place] signed by Alois Arnt Signature Date/Time: 02/04/2022/2:19:12 PM    Final          ______________________________________________________________________________________________       Current Reported Medications:.    No outpatient medications have been marked as taking for the 11/30/23 encounter (Appointment) with Tully Mcinturff D, NP.   Current Facility-Administered Medications for the 11/30/23 encounter (Appointment) with Jahkari Maclin D, NP  Medication   sodium chloride  flush (NS) 0.9 % injection 3 mL    Physical Exam:    VS:  LMP 12/14/2017  Wt Readings from Last 3 Encounters:  08/31/23 120 lb 14.4 oz (54.8 kg)  06/30/23 123 lb 3.8 oz (55.9 kg)  10/20/22 114 lb 10.2 oz (52 kg)    GEN: Well nourished, well developed in no acute distress NECK: No JVD; No carotid  bruits CARDIAC: ***RRR, no murmurs, rubs, gallops RESPIRATORY:  Clear to auscultation without rales, wheezing or rhonchi  ABDOMEN: Soft, non-tender, non-distended EXTREMITIES:  No edema; No acute deformity     Asessement and Plan:.     ***     Disposition: F/u with ***  Signed, Krystl Wickware D Pierson Vantol, NP

## 2023-11-30 ENCOUNTER — Ambulatory Visit: Admitting: Cardiology

## 2023-11-30 DIAGNOSIS — I3139 Other pericardial effusion (noninflammatory): Secondary | ICD-10-CM

## 2023-11-30 DIAGNOSIS — I10 Essential (primary) hypertension: Secondary | ICD-10-CM

## 2023-11-30 DIAGNOSIS — N186 End stage renal disease: Secondary | ICD-10-CM

## 2023-12-13 ENCOUNTER — Emergency Department (HOSPITAL_COMMUNITY)

## 2023-12-13 ENCOUNTER — Encounter (HOSPITAL_COMMUNITY): Payer: Self-pay | Admitting: Emergency Medicine

## 2023-12-13 ENCOUNTER — Emergency Department (HOSPITAL_COMMUNITY)
Admission: EM | Admit: 2023-12-13 | Discharge: 2023-12-14 | Disposition: A | Discharge status: Home / Self Care | Attending: Emergency Medicine | Admitting: Emergency Medicine

## 2023-12-13 DIAGNOSIS — Z5329 Procedure and treatment not carried out because of patient's decision for other reasons: Secondary | ICD-10-CM | POA: Diagnosis not present

## 2023-12-13 DIAGNOSIS — I120 Hypertensive chronic kidney disease with stage 5 chronic kidney disease or end stage renal disease: Secondary | ICD-10-CM | POA: Insufficient documentation

## 2023-12-13 DIAGNOSIS — R0602 Shortness of breath: Secondary | ICD-10-CM | POA: Insufficient documentation

## 2023-12-13 DIAGNOSIS — Z79899 Other long term (current) drug therapy: Secondary | ICD-10-CM | POA: Insufficient documentation

## 2023-12-13 DIAGNOSIS — Z992 Dependence on renal dialysis: Secondary | ICD-10-CM | POA: Diagnosis not present

## 2023-12-13 DIAGNOSIS — N186 End stage renal disease: Secondary | ICD-10-CM | POA: Insufficient documentation

## 2023-12-13 LAB — CBC
HCT: 30.5 % — ABNORMAL LOW (ref 36.0–46.0)
Hemoglobin: 9.7 g/dL — ABNORMAL LOW (ref 12.0–15.0)
MCH: 33.2 pg (ref 26.0–34.0)
MCHC: 31.8 g/dL (ref 30.0–36.0)
MCV: 104.5 fL — ABNORMAL HIGH (ref 80.0–100.0)
Platelets: 104 10*3/uL — ABNORMAL LOW (ref 150–400)
RBC: 2.92 MIL/uL — ABNORMAL LOW (ref 3.87–5.11)
RDW: 17 % — ABNORMAL HIGH (ref 11.5–15.5)
WBC: 4.4 10*3/uL (ref 4.0–10.5)
nRBC: 0 % (ref 0.0–0.2)

## 2023-12-13 LAB — BASIC METABOLIC PANEL WITH GFR
Anion gap: 14 (ref 5–15)
BUN: 47 mg/dL — ABNORMAL HIGH (ref 6–20)
CO2: 28 mmol/L (ref 22–32)
Calcium: 9.4 mg/dL (ref 8.9–10.3)
Chloride: 93 mmol/L — ABNORMAL LOW (ref 98–111)
Creatinine, Ser: 8.28 mg/dL — ABNORMAL HIGH (ref 0.44–1.00)
GFR, Estimated: 6 mL/min — ABNORMAL LOW (ref >60)
Glucose, Bld: 79 mg/dL (ref 70–99)
Potassium: 3.9 mmol/L (ref 3.5–5.1)
Sodium: 135 mmol/L (ref 135–145)

## 2023-12-13 LAB — BRAIN NATRIURETIC PEPTIDE: B Natriuretic Peptide: 201.2 pg/mL — ABNORMAL HIGH (ref 0.0–100.0)

## 2023-12-13 NOTE — ED Triage Notes (Signed)
 Pt here from home with c/o sob times 6 hours and cough since may , pt is on dialysis but has not missed any days

## 2023-12-14 MED ORDER — ALBUTEROL SULFATE HFA 108 (90 BASE) MCG/ACT IN AERS: 1.0000 | INHALATION_SPRAY | Freq: Four times a day (QID) | RESPIRATORY_TRACT | 0 refills | Status: DC | PRN

## 2023-12-14 MED ORDER — BENZONATATE 100 MG PO CAPS: 100.0000 mg | ORAL_CAPSULE | Freq: Three times a day (TID) | ORAL | 0 refills | Status: DC

## 2023-12-14 NOTE — ED Provider Notes (Signed)
 [place] EMERGENCY DEPARTMENT AT [facility] Provider Note   CSN: [id number] Arrival date & time: 12/13/23  2101     Patient presents with: Shortness of Breath   Madeline Brown is a 48 y.o. female patient with history of end-stage renal disease on dialysis Tuesday, Thursday, Saturday and hypertension who presents to the emergency department today for further evaluation of shortness of breath which started at work yesterday.  It is primarily with exertion and better with rest.  Patient had normal run of dialysis on Saturday.  She states that she has had this in the past when she had fluid overload but this is much less severe.  She denies any chest pain, fever, chills, abdominal pain, nausea, vomiting, diarrhea.  She does endorse associated cough for last 1.5 months.  It is for the most part dry but occasionally productive with clear sputum.    Shortness of Breath      Prior to Admission medications   Medication Sig Start Date End Date Taking? Authorizing Provider  albuterol  (VENTOLIN  HFA) 108 (90 Base) MCG/ACT inhaler Inhale 1-2 puffs into the lungs every 6 (six) hours as needed for wheezing or shortness of breath. 12/14/23  Yes Theotis, Chales Pelissier M, PA-C  benzonatate (TESSALON) 100 MG capsule Take 1 capsule (100 mg total) by mouth every 8 (eight) hours. 12/14/23  Yes Theotis, Daleysa Kristiansen M, PA-C  acetaminophen  (TYLENOL ) 500 MG tablet Take 500-1,000 mg by mouth every 6 (six) hours as needed for mild pain or headache.    [provider]  amLODipine  (NORVASC ) 10 MG tablet Take 1 tablet (10 mg total) by mouth at bedtime. 12/09/19 08/30/23  Aslam, Sadia, MD  B Complex-C-Folic Acid  (DIALYVITE TABLET) TABS Take 1 tablet by mouth daily.    [provider]  CALCITRIOL  PO Take 1 tablet by mouth.    [provider]  carvedilol  (COREG ) 25 MG tablet Take 1 tablet (25 mg total) by mouth 2 (two) times daily with a meal. Take 25 mg by mouth at 6:30 AM and 6:00 PM on  Sun/Mon/Wed/Fri and 25 mg at 11:00 AM and 8:00 PM on Tues/Thurs/Sat 08/06/22 08/30/23  [place] Lucie PARAS, PA-C  cinacalcet  (SENSIPAR ) 30 MG tablet Take 30 mg by mouth daily.    [provider]  hydrALAZINE  (APRESOLINE ) 100 MG tablet Take 1 tablet (100 mg total) by mouth 3 (three) times daily. 12/09/19 08/30/23  Aslam, Sadia, MD  levETIRAcetam  (KEPPRA ) 1000 MG tablet Take 1,000 mg by mouth daily.    [provider]  Methoxy PEG-Epoetin Beta (MIRCERA IJ) Mircera 06/16/23 06/14/24  [provider]  sevelamer  carbonate (RENVELA ) 800 MG tablet Take 1,600-2,400 mg by mouth 3 (three) times daily with meals. 800 mg with snack twice a day 02/10/23   [provider]  spironolactone  (ALDACTONE ) 50 MG tablet Take 1 tablet (50 mg total) by mouth daily. 12/09/19   Aslam, Sadia, MD  VELPHORO  500 MG chewable tablet Chew 500 mg by mouth 3 (three) times daily.    [provider]    Allergies: Glycerin-hypromellose-peg 400 and Visine [tetrahydrozoline hcl]    Review of Systems  Respiratory:  Positive for shortness of breath.   All other systems reviewed and are negative.   Updated Vital Signs BP (!) 137/95   Pulse 81   Temp 98.2 F (36.8 C) (Oral)   Resp 17   LMP 12/14/2017   SpO2 98%   Physical Exam Vitals and nursing note reviewed.  Constitutional:      General:  She is not in acute distress.    Appearance: Normal appearance.  HENT:     Head: Normocephalic and atraumatic.   Eyes:     General:        Right eye: No discharge.        Left eye: No discharge.    Cardiovascular:     Comments: Regular rate and rhythm.  S1/S2 are distinct without any evidence of murmur, rubs, or gallops.  Radial pulses are 2+ bilaterally.  Dorsalis pedis pulses are 2+ bilaterally.  No evidence of pedal edema. Pulmonary:     Comments: Clear to auscultation bilaterally.  Normal effort.  No respiratory distress.  No evidence of wheezes, rales, or rhonchi heard  throughout. Abdominal:     General: Abdomen is flat. Bowel sounds are normal. There is no distension.     Tenderness: There is no abdominal tenderness. There is no guarding or rebound.   Musculoskeletal:        General: Normal range of motion.     Cervical back: Neck supple.   Skin:    General: Skin is warm and dry.     Findings: No rash.   Neurological:     General: No focal deficit present.     Mental Status: She is alert.   Psychiatric:        Mood and Affect: Mood normal.        Behavior: Behavior normal.     (all labs ordered are listed, but only abnormal results are displayed) Labs Reviewed  BASIC METABOLIC PANEL WITH GFR - Abnormal; Notable for the following components:      Result Value   Chloride 93 (*)    BUN 47 (*)    Creatinine, Ser 8.28 (*)    GFR, Estimated 6 (*)    All other components within normal limits  CBC - Abnormal; Notable for the following components:   RBC 2.92 (*)    Hemoglobin 9.7 (*)    HCT 30.5 (*)    MCV 104.5 (*)    RDW 17.0 (*)    Platelets 104 (*)    All other components within normal limits  BRAIN NATRIURETIC PEPTIDE - Abnormal; Notable for the following components:   B Natriuretic Peptide 201.2 (*)    All other components within normal limits    EKG: None  Radiology: DG Chest 2 View Result Date: 12/13/2023 CLINICAL DATA:  Shortness of breath. EXAM: CHEST - 2 VIEW COMPARISON:  August 30, 2023 FINDINGS: The cardiac silhouette is enlarged and unchanged in size. There is mild to moderate severity calcification of the aortic arch. Mild atelectatic changes are seen within the bilateral lung bases. No pleural effusion or pneumothorax is identified. There is mild dextroscoliosis of the lower thoracic spine. IMPRESSION: Stable cardiomegaly with mild bibasilar atelectasis. Electronically Signed   By: Suzen Dials M.D.   On: 12/13/2023 22:15     Procedures   Medications Ordered in the ED - No data to display   Medical Decision  Making Madeline Brown is a 48 y.o. female patient who presents to the emergency department for further evaluation of shortness of breath.  Patient is completely asymptomatic at this time.  Labs and workup were initiated out in triage.  I personally reviewed these.  BNP in the 200 range.  She does not clinically appear to be volume overloaded today.  No evidence of pedal edema or crackles on exam.  Chest x-ray is clear without any signs of pulmonary edema.  Will  treat cough with Tessalon Perles and albuterol  inhaler.  She will present tomorrow to her dialysis center.  Strict turn precautions were discussed.  She is safe for discharge at this time.  Amount and/or Complexity of Data Reviewed Labs: ordered. Radiology: ordered.  Risk Prescription drug management.     Final diagnoses:  Shortness of breath    ED Discharge Orders          Ordered    albuterol  (VENTOLIN  HFA) 108 (90 Base) MCG/ACT inhaler  Every 6 hours PRN        12/14/23 0740    benzonatate (TESSALON) 100 MG capsule  Every 8 hours        12/14/23 0740               Theotis Cameron HERO, PA-C 12/14/23 0742    Mannie Pac T, DO 12/14/23 [phone]

## 2023-12-14 NOTE — ED Notes (Signed)
 Pt o2 86 at room air, 2L oxygen applied. RN notified

## 2023-12-14 NOTE — ED Notes (Signed)
Pt name called for updated vitals, no response 

## 2023-12-14 NOTE — Discharge Instructions (Signed)
 As we discussed, this is likely related to some excess fluid that you have secondary to end-stage renal disease.  Present tomorrow to your dialysis center.  The squeeze in your heart looks great and this is less likely to be from your heart.  Please return to the emergency department for any worsening symptoms including severe chest pain, shortness of breath that is getting worse, excessive weight gain outside of your control, or any other concerns you might have.

## 2023-12-14 NOTE — ED Provider Notes (Signed)
  [place] EMERGENCY DEPARTMENT AT [facility] Provider Note   CSN: [id number] Arrival date & time: 12/13/23  2101   Ultrasound ED Echo  Date/Time: 12/14/2023 7:42 AM  Performed by: Mannie Fairy DASEN, DO Authorized by: Mannie Fairy DASEN, DO   Procedure details:    Indications: dyspnea     Views: parasternal long axis view and parasternal short axis view     Images: archived   Findings:    Pericardium: no pericardial effusion     LV Function: normal (>50% EF)   Impression:    Impression: normal         Mannie Fairy T, DO 12/14/23 [phone]

## 2024-01-05 NOTE — Progress Notes (Unsigned)
 Cardiology Office Note:    Date:  01/06/2024   ID:  Madeline Brown, DOB [birth year], MRN [id number]  PCP:  [facility]   [facility] Providers Cardiologist:  Maude Emmer, MD Cardiology APP:  Madie Jon Garre, PA     Referring MD: Claudene Prentice DELENA Mickey., FNP   Chief Complaint  Patient presents with   Follow-up    Heart racing    History of Present Illness:    Madeline Brown is a 48 y.o. female with a hx of ESRD on HD T/TH/Sat, HTN, ICH secondary to HTN and medication noncompliance.   She was hospitalized 11/2019 with HTN emergency, ICH, and seizure secondary to noncompliance with medications.   She has a history of pericardial effusion secondary to uremia/minoxidil  in 2020, resolved on follow up echo. Nuclear stress test 03/2020 was nonischemic She was hospitalized 06/2022 with HFpEF and underwent RHC that showed high output CHF, felt possibly related to large fistula - following with [facility].   She was seen in the ER 12/13/23 for SOB and cough. Bedside echo negative for pericardial effusion. She was discharged without admission with conservative management.   She presents for cardiology follow up. She was at work at [employer] and became SOB. She was able to finish her shift, but continued to have DOE prompting ER evaluation as above.   She states her main problem is heart fluttering - heart racing daily for 2.5-3 weeks. Heart racing typically does not wake her from sleep, lasts about 1-2 minutes, using her home O2 can help resolve heart racing. HR at HD has been in the upper 90s, but is 100s with standing.   Nephrology reduced hydralazine  from 100 mg TID to 50 mg BID and started 25 mg losartan on HD days.   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   History of blood transfusion 07/2020   Hypertension    Intracranial hemorrhage (HCC)    LVH (left ventricular hypertrophy)     Pericardial effusion    Pneumonia    Pulmonary hypertension (HCC)    Seizure (HCC) 02/2019   last one was in 02/2019   Shortness of breath 07/15/2018   on 3L via [place] prn   Stroke (HCC)    Mild was told after having a seizure   Substance abuse (HCC)    Marijuana   Thrombocytopenia (HCC)     Past Surgical History:  Procedure Laterality Date   AV FISTULA PLACEMENT Left 06/24/2017   Procedure: CREATION OF LEFT ARM BRACHIALCEPHALIC  ARTERIOVENOUS (AV) FISTULA;  Surgeon: Oris Krystal FALCON, MD;  Location: [facility] OR;  Service: Vascular;  Laterality: Left;   AV FISTULA PLACEMENT Right 10/20/2022   Procedure: INSERTION OF RIGHT ARM ARTERIOVENOUS (AV) GORE-TEX GRAFT;  Surgeon: Magda Debby SAILOR, MD;  Location: [facility] OR;  Service: Vascular;  Laterality: Right;   CARDIAC CATHETERIZATION     CESAREAN SECTION     x2   COMPLEX WOUND CLOSURE Left 07/30/2020   Procedure: COMPLEX WOUND CLOSURE;  Surgeon: Magda Debby SAILOR, MD;  Location: [facility] OR;  Service: Vascular;  Laterality: Left;   FISTULA SUPERFICIALIZATION Left 10/28/2017   Procedure: SUPERFICIALIZATION LEFT BRACHIOCEPHALIC ARTERIOVENOUS FISTULA;  Surgeon: Oris Krystal FALCON, MD;  Location: [facility] OR;  Service: Vascular;  Laterality: Left;   INGUINAL HERNIA REPAIR Bilateral 02/01/2021   Procedure: LAPAROSCOPIC BILATERAL INGUINAL HERNIA REPAIR WITH MESH;  Surgeon: Stevie,  Herlene Righter, MD;  Location: [facility] ORS;  Service: General;  Laterality: Bilateral;   INSERTION OF DIALYSIS CATHETER Right 06/24/2017   Procedure: INSERTION OF TUNNELED DIALYSIS CATHETER;  Surgeon: Oris Krystal FALCON, MD;  [facility]: [facility] OR;  Service: Vascular;  Laterality: Right;   INSERTION OF DIALYSIS CATHETER Right 07/30/2020   Procedure: INSERTION OF DIALYSIS CATHETER;  Surgeon: Magda Debby SAILOR, MD;  [facility]: [facility] OR;  Service: Vascular;  Laterality: Right;   INSERTION OF DIALYSIS CATHETER Right 08/06/2022   Procedure: INSERTION OF TUNNELED DIALYSIS CATHETER;  Surgeon: Magda Debby SAILOR, MD;  [facility]: [facility] OR;   Service: Vascular;  Laterality: Right;   LIGATION OF COMPETING BRANCHES OF ARTERIOVENOUS FISTULA  10/28/2017   Procedure: LIGATION OF COMPETING BRANCHES OF ARTERIOVENOUS FISTULA x3;  Surgeon: Oris Krystal FALCON, MD;  [facility]: [facility];  Service: Vascular;;   REVISON OF ARTERIOVENOUS FISTULA Left 07/30/2020   Procedure: LEFT UPPER EXTREMITY ARTERIOVENOUS FISTULA REVISON;  Surgeon: Magda Debby SAILOR, MD;  [facility] OR;  Service: Vascular;  Laterality: Left;  PERIPHERAL NERVE BLOCK   REVISON OF ARTERIOVENOUS FISTULA Left 08/06/2022   Procedure: LEFT UPPER EXTREMITY FISTULA LIGATION;  Surgeon: Magda Debby SAILOR, MD;  [facility] OR;  Service: Vascular;  Laterality: Left;   RIGHT HEART CATH N/A 07/09/2022   Procedure: RIGHT HEART CATH;  Surgeon: Gardenia Led, DO;  [facility]: [facility] INVASIVE CV LAB;  Service: Cardiovascular;  Laterality: N/A;    Current Medications: Current Meds  Medication Sig   acetaminophen  (TYLENOL ) 500 MG tablet Take 500-1,000 mg by mouth every 6 (six) hours as needed for mild pain or headache.   albuterol  (VENTOLIN  HFA) 108 (90 Base) MCG/ACT inhaler Inhale 1-2 puffs into the lungs every 6 (six) hours as needed for wheezing or shortness of breath.   amLODipine  (NORVASC ) 10 MG tablet Take 1 tablet (10 mg total) by mouth at bedtime.   B Complex-C-Folic Acid  (DIALYVITE TABLET) TABS Take 1 tablet by mouth daily.   benzonatate  (TESSALON ) 100 MG capsule Take 1 capsule (100 mg total) by mouth every 8 (eight) hours.   CALCITRIOL  PO Take 1 tablet by mouth.   carvedilol  (COREG ) 25 MG tablet Take 1 tablet (25 mg total) by mouth 2 (two) times daily with a meal. Take 25 mg by mouth at 6:30 AM and 6:00 PM on Sun/Mon/Wed/Fri and 25 mg at 11:00 AM and 8:00 PM on Tues/Thurs/Sat   cinacalcet  (SENSIPAR ) 30 MG tablet Take 30 mg by mouth daily.   hydrALAZINE  (APRESOLINE ) 100 MG tablet Take 1 tablet (100 mg total) by mouth 3 (three) times daily. (Patient taking differently: Take 100 mg by mouth 2 (two)  times daily.)   levETIRAcetam  (KEPPRA ) 1000 MG tablet Take 1,000 mg by mouth daily.   losartan (COZAAR) 25 MG tablet Take 1 tablet by mouth daily.   Methoxy PEG-Epoetin Beta (MIRCERA IJ) Mircera   sevelamer  carbonate (RENVELA ) 800 MG tablet Take 1,600-2,400 mg by mouth 3 (three) times daily with meals. 800 mg with snack twice a day   spironolactone  (ALDACTONE ) 50 MG tablet Take 1 tablet (50 mg total) by mouth daily.   VELPHORO  500 MG chewable tablet Chew 500 mg by mouth 3 (three) times daily.   Current Facility-Administered Medications for the 01/06/24 encounter (Office Visit) with Madie Jon Garre, PA  Medication   sodium chloride  flush (NS) 0.9 % injection 3 mL     Allergies:   Glycerin-hypromellose-peg 400 and Visine [tetrahydrozoline hcl]   Social History   Socioeconomic History   Marital status:  Single    Spouse name: Not on file   Number of children: Not on file   Years of education: Not on file   Highest education level: Not on file  Occupational History   Not on file  Tobacco Use   Smoking status: Former    Current packs/day: 0.25    Average packs/day: 0.3 packs/day for 29.6 years (7.4 ttl pk-yrs)    Types: Cigarettes    Start date: 1996    Passive exposure: Current   Smokeless tobacco: Never   Tobacco comments:    Cutting back to 1/4 ppd  Vaping Use   Vaping status: Never Used  Substance and Sexual Activity   Alcohol use: Not Currently    Comment: occassional beer   Drug use: Yes    Types: Marijuana    Comment: OCASSIONAL, LAST TIME 10/16/22   Sexual activity: Yes    Birth control/protection: Post-menopausal  Other Topics Concern   Not on file  Social History Narrative   Not on file   Social Drivers of Health   Financial Resource Strain: Low Risk  (11/03/2022)   Received from [facility]   Overall Financial Resource Strain (CARDIA)    Difficulty of Paying Living Expenses: Not hard at all  Food Insecurity: No Food Insecurity (08/30/2023)   Hunger  Vital Sign    Worried About Running Out of Food in the Last Year: Never true    Ran Out of Food in the Last Year: Never true  Transportation Needs: No Transportation Needs (08/30/2023)   PRAPARE - Administrator, Civil Service (Medical): No    Lack of Transportation (Non-Medical): No  Physical Activity: Not on file  Stress: Not on file  Social Connections: Not on file     Family History: The patient's family history includes Aneurysm in her mother; Cancer in her father; Diabetes in her father; Heart disease in her father; Hypertension in her father; Seizures in her mother.  ROS:   Please see the history of present illness.     All other systems reviewed and are negative.  EKGs/Labs/Other Studies Reviewed:    The following studies were reviewed today:  EKG Interpretation Date/Time:  Wednesday January 06 2024 10:25:08 EDT Ventricular Rate:  88 PR Interval:  220 QRS Duration:  72 QT Interval:  388 QTC Calculation: 469 R Axis:   76  Text Interpretation: Sinus rhythm with 1st degree A-V block Left atrial enlargement Nonspecific ST abnormality When compared with ECG of 13-Dec-2023 21:17, No significant change was found Confirmed by Madie Slough ([id number]) on 01/06/2024 10:28:30 AM    Recent Labs: 06/30/2023: ALT 10 08/30/2023: Magnesium  2.4 12/13/2023: B Natriuretic Peptide 201.2; BUN 47; Creatinine, Ser 8.28; Hemoglobin 9.7; Platelets 104; Potassium 3.9; Sodium 135  Recent Lipid Panel    Component Value Date/Time   CHOL 172 03/23/2019 0839   TRIG 55 03/23/2019 0839   HDL 93 03/23/2019 0839   CHOLHDL 1.8 03/23/2019 0839   CHOLHDL 2.7 02/21/2019 0425   VLDL 10 02/21/2019 0425   LDLCALC 68 03/23/2019 0839     Risk Assessment/Calculations:                Physical Exam:    VS:  BP 108/74   Ht 5' (1.524 m)   Wt 117 lb (53.1 kg)   LMP 12/14/2017   BMI 22.85 kg/m     Wt Readings from Last 3 Encounters:  01/06/24 117 lb (53.1 kg)  08/31/23 120 lb 14.4 oz (  54.8  kg)  06/30/23 123 lb 3.8 oz (55.9 kg)     GEN:  Well nourished, well developed in no acute distress HEENT: Normal NECK: No JVD; No carotid bruits LYMPHATICS: No lymphadenopathy CARDIAC: RRR, no murmurs, rubs, gallops RESPIRATORY:  Clear to auscultation without rales, wheezing or rhonchi  ABDOMEN: Soft, non-tender, non-distended MUSCULOSKELETAL:  No edema; No deformity  SKIN: Warm and dry NEUROLOGIC:  Alert and oriented x 3 PSYCHIATRIC:  Normal affect   ASSESSMENT:    1. Primary hypertension   2. Diastolic congestive heart failure, unspecified HF chronicity (HCC)   3. Palpitations   4. SOB (shortness of breath)   5. ESRD (end stage renal disease) (HCC)   6. Anemia of chronic disease   7. Pericardial effusion    PLAN:    In order of problems listed above:  DOE - could be related to palpitations - will update echo   Palpitations Heart racing - will obtain 14 day zio - already on 25 mg coreg  BID   HFpEF ESRD on HD - volume managed by HD   Hypertension LVH - 10 mg amlodipine , 25 mg coreg  BID, 100 mg hydralazine  TID, 50 mg spironolactone  daily   Hx of pericardial effusion - felt related to uremia - not appreciated on bedside echo 11/2023       Follow up with me in 5 weeks.     Medication Adjustments/Labs and Tests Ordered: Current medicines are reviewed at length with the patient today.  Concerns regarding medicines are outlined above.  Orders Placed This Encounter  Procedures   EKG 12-Lead   No orders of the defined types were placed in this encounter.   Patient Instructions  Medication Instructions:  No medication changes were made during today's visit.  *If you need a refill on your cardiac medications before your next appointment, please call your pharmacy*   Lab Work: No labs were ordered during today's visit.  If you have labs (blood work) drawn today and your tests are completely normal, you will receive your results only by: MyChart  Message (if you have MyChart) OR A paper copy in the mail If you have any lab test that is abnormal or we need to change your treatment, we will call you to review the results.   Testing/Procedures:  Your physician has requested you wear your ZIO patch monitor 14 days.   This is a single patch monitor.  [employer] supplies one patch monitor per enrollment.  Additional stickers are not available.   Please do not apply patch if you will be having a Nuclear Stress Test, Echocardiogram, Cardiac CT, MRI, or Chest Xray during the time frame you would be wearing the monitor. The patch cannot be worn during these tests.  You cannot remove and re-apply the ZIO XT patch monitor.   Your ZIO patch monitor will be sent USPS Priority mail from [facility] directly to your home address. The monitor may also be mailed to a PO BOX if home delivery is not available.   It may take 3-5 days to receive your monitor after you have been enrolled.   Once you have received you monitor, please review enclosed instructions.  Your monitor has already been registered assigning a specific monitor serial # to you.   Applying the monitor   Shave hair from upper left chest.   Hold abrader disc by orange tab.  Rub abrader in 40 strokes over left upper chest as indicated in your monitor instructions.   Clean  area with 4 enclosed alcohol pads .  Use all pads to assure are is cleaned thoroughly.  Let dry.   Apply patch as indicated in monitor instructions.  Patch will be place under collarbone on left side of chest with arrow pointing upward.   Rub patch adhesive wings for 2 minutes.Remove white label marked 1.  Remove white label marked 2.  Rub patch adhesive wings for 2 additional minutes.   While looking in a mirror, press and release button in center of patch.  A small green light will flash 3-4 times .  This will be your only indicator the monitor has been turned on.     Do not shower for the first 24  hours.  You may shower after the first 24 hours.   Press button if you feel a symptom. You will hear a small click.  Record Date, Time and Symptom in the Patient Log Book.   When you are ready to remove patch, follow instructions on last 2 pages of Patient Log Book.  Stick patch monitor onto last page of Patient Log Book.   Place Patient Log Book in [place] box.  Use locking tab on box and tape box closed securely.  The [employer] and [employer] has [employer] on it.  Please place in mailbox as soon as possible.  Your physician should have your test results approximately 7 days after the monitor has been mailed back to [facility].   Call [facility] Customer Care at [phone] if you have questions regarding your ZIO XT patch monitor.  Call them immediately if you see an orange light blinking on your monitor.   If your monitor falls off in less than 4 days contact our Monitor department at [phone].  If your monitor becomes loose or falls off after 4 days call [employer] at [phone] for suggestions on securing your monitor.      Your physician has requested that you have an echocardiogram. Echocardiography is a painless test that uses sound waves to create images of your heart. It provides your doctor with information about the size and shape of your heart and how well your heart's chambers and valves are working. This procedure takes approximately one hour. There are no restrictions for this procedure.  Please do NOT wear perfume or lotions. (deodorant is allowed).  Please arrive 15 minutes prior to your appointment time.  Please note: We ask at that you not bring children with you during ultrasound (echo/ vascular) testing. Due to room size and safety concerns, children are not allowed in the ultrasound rooms during exams. Our front office staff cannot provide observation of children in our lobby area while testing is being conducted. An adult accompanying a patient to their  appointment will only be allowed in the ultrasound room at the discretion of the ultrasound technician under special circumstances. We apologize for any inconvenience.    Follow-Up: At [facility], you and your health needs are our priority.  As part of our continuing mission to provide you with exceptional heart care, we have created designated Provider Care Teams.  These Care Teams include your primary Cardiologist (physician) and Advanced Practice Providers (APPs -  Physician Assistants and Nurse Practitioners) who all work together to provide you with the care you need, when you need it.  We recommend signing up for the patient portal called MyChart.  Sign up information is provided on this After Visit Summary.  MyChart is used to connect with patients for Virtual  Visits (Telemedicine).  Patients are able to view lab/test results, encounter notes, upcoming appointments, etc.  Non-urgent messages can be sent to your provider as well.   To learn more about what you can do with MyChart, go to [email or web address].    Your next appointment:   5-6 week(s)  Provider:   Jon Hails, PA-C          Other Instructions Thank you for choosing [facility]!       Signed, Jon Nat Hails, [place]  01/06/2024 10:37 AM    [facility]

## 2024-01-06 ENCOUNTER — Encounter: Payer: Self-pay | Admitting: Physician Assistant

## 2024-01-06 ENCOUNTER — Ambulatory Visit: Attending: Physician Assistant | Admitting: Physician Assistant

## 2024-01-06 ENCOUNTER — Ambulatory Visit

## 2024-01-06 VITALS — BP 108/74 | Ht 60.0 in | Wt 117.0 lb

## 2024-01-06 DIAGNOSIS — I3139 Other pericardial effusion (noninflammatory): Secondary | ICD-10-CM

## 2024-01-06 DIAGNOSIS — N186 End stage renal disease: Secondary | ICD-10-CM

## 2024-01-06 DIAGNOSIS — R002 Palpitations: Secondary | ICD-10-CM | POA: Diagnosis not present

## 2024-01-06 DIAGNOSIS — I10 Essential (primary) hypertension: Secondary | ICD-10-CM

## 2024-01-06 DIAGNOSIS — R0602 Shortness of breath: Secondary | ICD-10-CM | POA: Diagnosis not present

## 2024-01-06 DIAGNOSIS — D638 Anemia in other chronic diseases classified elsewhere: Secondary | ICD-10-CM

## 2024-01-06 DIAGNOSIS — I5030 Unspecified diastolic (congestive) heart failure: Secondary | ICD-10-CM

## 2024-01-06 NOTE — Progress Notes (Unsigned)
 ZIO serial # [id number] from office inventory applied to patient.  Monitor was defective and flashing orange.  Removed defective monitor and applied serial # [id number].  Email sent to [facility] to cancel charges on [id number] and associate new monitor serial # [id number] to the patients enrollment.  Dr. Delford to read.

## 2024-01-06 NOTE — Patient Instructions (Addendum)
 Medication Instructions:  No medication changes were made during today's visit.  *If you need a refill on your cardiac medications before your next appointment, please call your pharmacy*   Lab Work: No labs were ordered during today's visit.  If you have labs (blood work) drawn today and your tests are completely normal, you will receive your results only by: MyChart Message (if you have MyChart) OR A paper copy in the mail If you have any lab test that is abnormal or we need to change your treatment, we will call you to review the results.   Testing/Procedures: Your physician has requested you wear your ZIO patch monitor 14 days.   This is a single patch monitor.  [employer] supplies one patch monitor per enrollment.  Additional stickers are not available.   Please do not apply patch if you will be having a Nuclear Stress Test, Echocardiogram, Cardiac CT, MRI, or Chest Xray during the time frame you would be wearing the monitor. The patch cannot be worn during these tests.  You cannot remove and re-apply the ZIO XT patch monitor.   Your ZIO patch monitor will be sent USPS Priority mail from [facility] directly to your home address. The monitor may also be mailed to a PO BOX if home delivery is not available.   It may take 3-5 days to receive your monitor after you have been enrolled.   Once you have received you monitor, please review enclosed instructions.  Your monitor has already been registered assigning a specific monitor serial # to you.   Applying the monitor   Shave hair from upper left chest.   Hold abrader disc by orange tab.  Rub abrader in 40 strokes over left upper chest as indicated in your monitor instructions.   Clean area with 4 enclosed alcohol pads .  Use all pads to assure are is cleaned thoroughly.  Let dry.   Apply patch as indicated in monitor instructions.  Patch will be place under collarbone on left side of chest with arrow pointing upward.   Rub  patch adhesive wings for 2 minutes.Remove white label marked 1.  Remove white label marked 2.  Rub patch adhesive wings for 2 additional minutes.   While looking in a mirror, press and release button in center of patch.  A small green light will flash 3-4 times .  This will be your only indicator the monitor has been turned on.     Do not shower for the first 24 hours.  You may shower after the first 24 hours.   Press button if you feel a symptom. You will hear a small click.  Record Date, Time and Symptom in the Patient Log Book.   When you are ready to remove patch, follow instructions on last 2 pages of Patient Log Book.  Stick patch monitor onto last page of Patient Log Book.   Place Patient Log Book in [place] box.  Use locking tab on box and tape box closed securely.  The [employer] has [employer] on it.  Please place in mailbox as soon as possible.  Your physician should have your test results approximately 7 days after the monitor has been mailed back to [facility].   Call [facility] Customer Care at [phone] if you have questions regarding your ZIO XT patch monitor.  Call them immediately if you see an orange light blinking on your monitor.   If your monitor falls off in less than 4  days contact our Monitor department at [phone].  If your monitor becomes loose or falls off after 4 days call [employer] at [phone] for suggestions on securing your monitor.      Your physician has requested that you have an echocardiogram. Echocardiography is a painless test that uses sound waves to create images of your heart. It provides your doctor with information about the size and shape of your heart and how well your heart's chambers and valves are working. This procedure takes approximately one hour. There are no restrictions for this procedure.  Please do NOT wear perfume or lotions. (deodorant is allowed).  Please arrive 15 minutes prior to your appointment  time.  Please note: We ask at that you not bring children with you during ultrasound (echo/ vascular) testing. Due to room size and safety concerns, children are not allowed in the ultrasound rooms during exams. Our front office staff cannot provide observation of children in our lobby area while testing is being conducted. An adult accompanying a patient to their appointment will only be allowed in the ultrasound room at the discretion of the ultrasound technician under special circumstances. We apologize for any inconvenience.    Follow-Up: At [facility], you and your health needs are our priority.  As part of our continuing mission to provide you with exceptional heart care, we have created designated Provider Care Teams.  These Care Teams include your primary Cardiologist (physician) and Advanced Practice Providers (APPs -  Physician Assistants and Nurse Practitioners) who all work together to provide you with the care you need, when you need it.  We recommend signing up for the patient portal called MyChart.  Sign up information is provided on this After Visit Summary.  MyChart is used to connect with patients for Virtual Visits (Telemedicine).  Patients are able to view lab/test results, encounter notes, upcoming appointments, etc.  Non-urgent messages can be sent to your provider as well.   To learn more about what you can do with MyChart, go to [email or web address].    Your next appointment:   5-6 week(s)  Provider:   Jon Hails, PA-C        or next available APP  Other Instructions Thank you for choosing [facility]!

## 2024-01-12 ENCOUNTER — Ambulatory Visit: Admitting: Cardiology

## 2024-01-14 ENCOUNTER — Inpatient Hospital Stay (HOSPITAL_COMMUNITY)
Admission: EM | Admit: 2024-01-14 | Discharge: 2024-01-16 | DRG: 640 | Disposition: A | Discharge status: Home / Self Care | Attending: Family Medicine | Admitting: Family Medicine

## 2024-01-14 ENCOUNTER — Emergency Department (HOSPITAL_COMMUNITY)

## 2024-01-14 ENCOUNTER — Encounter (HOSPITAL_COMMUNITY): Payer: Self-pay

## 2024-01-14 ENCOUNTER — Other Ambulatory Visit: Payer: Self-pay

## 2024-01-14 DIAGNOSIS — Z809 Family history of malignant neoplasm, unspecified: Secondary | ICD-10-CM | POA: Diagnosis not present

## 2024-01-14 DIAGNOSIS — I132 Hypertensive heart and chronic kidney disease with heart failure and with stage 5 chronic kidney disease, or end stage renal disease: Secondary | ICD-10-CM | POA: Diagnosis present

## 2024-01-14 DIAGNOSIS — N2581 Secondary hyperparathyroidism of renal origin: Secondary | ICD-10-CM | POA: Diagnosis present

## 2024-01-14 DIAGNOSIS — Z79899 Other long term (current) drug therapy: Secondary | ICD-10-CM

## 2024-01-14 DIAGNOSIS — J9601 Acute respiratory failure with hypoxia: Secondary | ICD-10-CM | POA: Diagnosis present

## 2024-01-14 DIAGNOSIS — I3139 Other pericardial effusion (noninflammatory): Secondary | ICD-10-CM | POA: Diagnosis present

## 2024-01-14 DIAGNOSIS — Z8249 Family history of ischemic heart disease and other diseases of the circulatory system: Secondary | ICD-10-CM

## 2024-01-14 DIAGNOSIS — G40909 Epilepsy, unspecified, not intractable, without status epilepticus: Secondary | ICD-10-CM | POA: Diagnosis present

## 2024-01-14 DIAGNOSIS — J810 Acute pulmonary edema: Secondary | ICD-10-CM | POA: Diagnosis present

## 2024-01-14 DIAGNOSIS — I5032 Chronic diastolic (congestive) heart failure: Secondary | ICD-10-CM | POA: Diagnosis present

## 2024-01-14 DIAGNOSIS — D631 Anemia in chronic kidney disease: Secondary | ICD-10-CM | POA: Diagnosis present

## 2024-01-14 DIAGNOSIS — F17210 Nicotine dependence, cigarettes, uncomplicated: Secondary | ICD-10-CM | POA: Diagnosis present

## 2024-01-14 DIAGNOSIS — Z992 Dependence on renal dialysis: Secondary | ICD-10-CM | POA: Diagnosis not present

## 2024-01-14 DIAGNOSIS — I5083 High output heart failure: Secondary | ICD-10-CM | POA: Diagnosis present

## 2024-01-14 DIAGNOSIS — E8770 Fluid overload, unspecified: Secondary | ICD-10-CM | POA: Diagnosis present

## 2024-01-14 DIAGNOSIS — Z8673 Personal history of transient ischemic attack (TIA), and cerebral infarction without residual deficits: Secondary | ICD-10-CM | POA: Diagnosis not present

## 2024-01-14 DIAGNOSIS — Z820 Family history of epilepsy and other diseases of the nervous system: Secondary | ICD-10-CM

## 2024-01-14 DIAGNOSIS — I272 Pulmonary hypertension, unspecified: Secondary | ICD-10-CM | POA: Diagnosis present

## 2024-01-14 DIAGNOSIS — I10 Essential (primary) hypertension: Secondary | ICD-10-CM | POA: Diagnosis not present

## 2024-01-14 DIAGNOSIS — Z91119 Patient's noncompliance with dietary regimen due to unspecified reason: Secondary | ICD-10-CM

## 2024-01-14 DIAGNOSIS — N186 End stage renal disease: Secondary | ICD-10-CM | POA: Diagnosis present

## 2024-01-14 DIAGNOSIS — E8779 Other fluid overload: Secondary | ICD-10-CM | POA: Diagnosis not present

## 2024-01-14 DIAGNOSIS — Z833 Family history of diabetes mellitus: Secondary | ICD-10-CM

## 2024-01-14 DIAGNOSIS — Z888 Allergy status to other drugs, medicaments and biological substances status: Secondary | ICD-10-CM

## 2024-01-14 DIAGNOSIS — N185 Chronic kidney disease, stage 5: Secondary | ICD-10-CM | POA: Diagnosis not present

## 2024-01-14 LAB — CBC
HCT: 19.1 % — ABNORMAL LOW (ref 36.0–46.0)
HCT: 23.2 % — ABNORMAL LOW (ref 36.0–46.0)
Hemoglobin: 6.1 g/dL — CL (ref 12.0–15.0)
Hemoglobin: 7.6 g/dL — ABNORMAL LOW (ref 12.0–15.0)
MCH: 32.3 pg (ref 26.0–34.0)
MCH: 32.3 pg (ref 26.0–34.0)
MCHC: 31.9 g/dL (ref 30.0–36.0)
MCHC: 32.8 g/dL (ref 30.0–36.0)
MCV: 101.1 fL — ABNORMAL HIGH (ref 80.0–100.0)
MCV: 98.7 fL (ref 80.0–100.0)
Platelets: 104 K/uL — ABNORMAL LOW (ref 150–400)
Platelets: 142 K/uL — ABNORMAL LOW (ref 150–400)
RBC: 1.89 MIL/uL — ABNORMAL LOW (ref 3.87–5.11)
RBC: 2.35 MIL/uL — ABNORMAL LOW (ref 3.87–5.11)
RDW: 15.9 % — ABNORMAL HIGH (ref 11.5–15.5)
RDW: 15.8 % — ABNORMAL HIGH (ref 11.5–15.5)
WBC: 4.3 K/uL (ref 4.0–10.5)
WBC: 5.3 K/uL (ref 4.0–10.5)
nRBC: 0 % (ref 0.0–0.2)
nRBC: 0 % (ref 0.0–0.2)

## 2024-01-14 LAB — BASIC METABOLIC PANEL WITH GFR
Anion gap: 14 (ref 5–15)
BUN: 61 mg/dL — ABNORMAL HIGH (ref 6–20)
CO2: 26 mmol/L (ref 22–32)
Calcium: 9.2 mg/dL (ref 8.9–10.3)
Chloride: 95 mmol/L — ABNORMAL LOW (ref 98–111)
Creatinine, Ser: 10.11 mg/dL — ABNORMAL HIGH (ref 0.44–1.00)
GFR, Estimated: 4 mL/min — ABNORMAL LOW (ref >60)
Glucose, Bld: 120 mg/dL — ABNORMAL HIGH (ref 70–99)
Potassium: 4 mmol/L (ref 3.5–5.1)
Sodium: 135 mmol/L (ref 135–145)

## 2024-01-14 LAB — HEPATITIS B SURFACE ANTIGEN: Hepatitis B Surface Ag: NONREACTIVE

## 2024-01-14 LAB — BRAIN NATRIURETIC PEPTIDE: B Natriuretic Peptide: 2111.4 pg/mL — ABNORMAL HIGH (ref 0.0–100.0)

## 2024-01-14 MED ORDER — CARVEDILOL 25 MG PO TABS
25.0000 mg | ORAL_TABLET | Freq: Two times a day (BID) | ORAL | Status: DC
  Administered 2024-01-15 (×2): 25 mg via ORAL
  Filled 2024-01-14: qty 1
  Filled 2024-01-14 (×2): qty 2

## 2024-01-14 MED ORDER — LOSARTAN POTASSIUM 25 MG PO TABS
25.0000 mg | ORAL_TABLET | Freq: Every day | ORAL | Status: DC
  Administered 2024-01-15: 25 mg via ORAL
  Filled 2024-01-14: qty 1

## 2024-01-14 MED ORDER — LEVETIRACETAM 500 MG PO TABS
1000.0000 mg | ORAL_TABLET | Freq: Every day | ORAL | Status: DC
  Administered 2024-01-15 – 2024-01-16 (×2): 1000 mg via ORAL
  Filled 2024-01-14 (×2): qty 2

## 2024-01-14 MED ORDER — PENTAFLUOROPROP-TETRAFLUOROETH EX AERO
1.0000 | INHALATION_SPRAY | CUTANEOUS | Status: DC | PRN
  Filled 2024-01-14: qty 116

## 2024-01-14 MED ORDER — ALBUTEROL SULFATE (2.5 MG/3ML) 0.083% IN NEBU: 2.5000 mg | INHALATION_SOLUTION | Freq: Four times a day (QID) | RESPIRATORY_TRACT | Status: DC | PRN

## 2024-01-14 MED ORDER — CHLORHEXIDINE GLUCONATE CLOTH 2 % EX PADS
6.0000 | MEDICATED_PAD | Freq: Every day | CUTANEOUS | Status: DC
  Administered 2024-01-15 – 2024-01-16 (×2): 6 via TOPICAL

## 2024-01-14 MED ORDER — CALCITRIOL 0.5 MCG PO CAPS
0.7500 ug | ORAL_CAPSULE | ORAL | Status: DC
  Administered 2024-01-15: 0.75 ug via ORAL
  Filled 2024-01-14: qty 1

## 2024-01-14 MED ORDER — HEPARIN SODIUM (PORCINE) 5000 UNIT/ML IJ SOLN
5000.0000 [IU] | Freq: Three times a day (TID) | INTRAMUSCULAR | Status: DC
  Administered 2024-01-14 – 2024-01-16 (×5): 5000 [IU] via SUBCUTANEOUS
  Filled 2024-01-14 (×6): qty 1

## 2024-01-14 MED ORDER — HEPARIN SODIUM (PORCINE) 1000 UNIT/ML DIALYSIS
1000.0000 [IU] | INTRAMUSCULAR | Status: DC | PRN
  Filled 2024-01-14: qty 1

## 2024-01-14 MED ORDER — SUCROFERRIC OXYHYDROXIDE 500 MG PO CHEW: 500.0000 mg | CHEWABLE_TABLET | Freq: Three times a day (TID) | ORAL | Status: DC

## 2024-01-14 MED ORDER — CINACALCET HCL 30 MG PO TABS
30.0000 mg | ORAL_TABLET | Freq: Every day | ORAL | Status: DC
  Administered 2024-01-15 – 2024-01-16 (×2): 30 mg via ORAL
  Filled 2024-01-14 (×2): qty 1

## 2024-01-14 MED ORDER — LIDOCAINE HCL (PF) 1 % IJ SOLN: 5.0000 mL | INTRAMUSCULAR | Status: DC | PRN

## 2024-01-14 MED ORDER — ALTEPLASE 2 MG IJ SOLR: 2.0000 mg | Freq: Once | INTRAMUSCULAR | Status: DC | PRN

## 2024-01-14 MED ORDER — SEVELAMER CARBONATE 800 MG PO TABS
2400.0000 mg | ORAL_TABLET | Freq: Three times a day (TID) | ORAL | Status: DC
  Administered 2024-01-15 – 2024-01-16 (×4): 2400 mg via ORAL
  Filled 2024-01-14 (×6): qty 3

## 2024-01-14 NOTE — ED Provider Notes (Signed)
 [place] [facility] AT [facility] Provider Note   CSN: [id number] Arrival date & time: 01/14/24  9366     Patient presents with: Shortness of Breath   Madeline Brown is a 48 y.o. female past medical history significant for ESRD on dialysis Tuesday Thursday Saturday, anemia of chronic disease, HFpEF with EF greater than 50% on 12/14/2023 who presents emergency department for shortness of breath.  Patient states that she has been experiencing shortness of breath for approximately 2 weeks that has been worsening.  Patient states that she is not normally on supplemental oxygen at home however the past 2 weeks has had incremental increases in supplemental oxygen going from 1.5 to 3 L nasal cannula.  Patient states that her last dialysis session was last Tuesday however this did not completely resolve her shortness of breath.  Patient endorses intermittent pleuritic chest pain with shortness of breath however denies nausea, vomiting, fever, productive cough.   HPI     Prior to Admission medications   Medication Sig Start Date End Date Taking? Authorizing Provider  acetaminophen  (TYLENOL ) 500 MG tablet Take 500-1,000 mg by mouth every 6 (six) hours as needed for mild pain or headache.    [provider]  albuterol  (VENTOLIN  HFA) 108 (90 Base) MCG/ACT inhaler Inhale 1-2 puffs into the lungs every 6 (six) hours as needed for wheezing or shortness of breath. 12/14/23   Theotis Peers M, PA-C  amLODipine  (NORVASC ) 10 MG tablet Take 1 tablet (10 mg total) by mouth at bedtime. 12/09/19 01/06/24  Aslam, Sadia, MD  B Complex-C-Folic Acid  (DIALYVITE TABLET) TABS Take 1 tablet by mouth daily.    [provider]  benzonatate  (TESSALON ) 100 MG capsule Take 1 capsule (100 mg total) by mouth every 8 (eight) hours. 12/14/23   Theotis Peers M, PA-C  CALCITRIOL  PO Take 1 tablet by mouth.    [provider]  carvedilol  (COREG ) 25 MG tablet Take 1 tablet (25 mg total)  by mouth 2 (two) times daily with a meal. Take 25 mg by mouth at 6:30 AM and 6:00 PM on Sun/Mon/Wed/Fri and 25 mg at 11:00 AM and 8:00 PM on Tues/Thurs/Sat 08/06/22 01/06/24  Rhyne, Samantha J, PA-C  cinacalcet  (SENSIPAR ) 30 MG tablet Take 30 mg by mouth daily.    [provider]  hydrALAZINE  (APRESOLINE ) 100 MG tablet Take 1 tablet (100 mg total) by mouth 3 (three) times daily. Patient taking differently: Take 100 mg by mouth 2 (two) times daily. 12/09/19 01/06/24  Aslam, Sadia, MD  levETIRAcetam  (KEPPRA ) 1000 MG tablet Take 1,000 mg by mouth daily.    [provider]  losartan  (COZAAR ) 25 MG tablet Take 1 tablet by mouth daily. 01/05/24   [provider]  Methoxy PEG-Epoetin Beta (MIRCERA IJ) Mircera 06/16/23 06/14/24  [provider]  sevelamer  carbonate (RENVELA ) 800 MG tablet Take 1,600-2,400 mg by mouth 3 (three) times daily with meals. 800 mg with snack twice a day 02/10/23   [provider]  spironolactone  (ALDACTONE ) 50 MG tablet Take 1 tablet (50 mg total) by mouth daily. 12/09/19   Aslam, Sadia, MD  VELPHORO  500 MG chewable tablet Chew 500 mg by mouth 3 (three) times daily.    [provider]    Allergies: Glycerin-hypromellose-peg 400 and Visine [tetrahydrozoline hcl]    Review of Systems  Updated Vital Signs BP 117/84   Pulse 83   Temp 99.1 F (37.3 C)   Resp 18   LMP 12/14/2017   SpO2 100%  Physical Exam Vitals and nursing note reviewed.  Constitutional:      General: She is not in acute distress.    Appearance: She is not ill-appearing.  HENT:     Head: Normocephalic and atraumatic.  Neck:     Vascular: No JVD.     Trachea: Trachea normal.  Cardiovascular:     Rate and Rhythm: Regular rhythm. Tachycardia present.     Heart sounds: Normal heart sounds. No murmur heard.    Arteriovenous access: Right arteriovenous access is present.     Comments: Right AV fistula with palpable thrill.   Pulmonary:     Effort: No  tachypnea.     Breath sounds: Examination of the right-lower field reveals rales. Examination of the left-lower field reveals rales. Rales present.     Comments: On 4.5 L nasal cannula Abdominal:     General: There is no distension.     Palpations: Abdomen is soft.     Tenderness: There is no abdominal tenderness.  Musculoskeletal:     Right lower leg: No edema.     Left lower leg: No edema.  Skin:    Capillary Refill: Capillary refill takes less than 2 seconds.  Neurological:     Mental Status: She is alert.  Psychiatric:        Behavior: Behavior is cooperative.     (all labs ordered are listed, but only abnormal results are displayed) Labs Reviewed  CBC  BRAIN NATRIURETIC PEPTIDE  BASIC METABOLIC PANEL WITH GFR  HEPATITIS B SURFACE ANTIBODY, QUANTITATIVE  HEPATITIS B SURFACE ANTIGEN  CBC  I-STAT VENOUS BLOOD GAS, ED    EKG: None  Radiology: DG Chest Portable 1 View Result Date: 01/14/2024 CLINICAL DATA:  48 year old female with shortness of breath and cough. Tachycardia. End stage renal disease. EXAM: PORTABLE CHEST 1 VIEW COMPARISON:  Chest radiograph 12/13/2023 and earlier. FINDINGS: Portable AP upright view at 0744 hours. Probably external round metallic device projecting over the left upper chest now. Stable cardiomegaly and mediastinal contours. Small but increased bilateral pleural effusions. Increased bilateral pulmonary vascular congestion, symmetric interstitial opacity. No pneumothorax. No air bronchograms. Visualized tracheal air column is within normal limits. No acute osseous abnormality identified. Dextroconvex scoliosis. Negative visible bowel gas. IMPRESSION: Acute pulmonary edema with small pleural effusions, progressed since 12/13/2023. Chronic cardiomegaly. Electronically Signed   By: VEAR Hurst M.D.   On: 01/14/2024 07:57     Procedures   Medications Ordered in the ED  Chlorhexidine  Gluconate Cloth 2 % PADS 6 each (0 each Topical Hold 01/14/24 1014)   pentafluoroprop-tetrafluoroeth (GEBAUERS) aerosol 1 Application (has no administration in time range)  lidocaine  (PF) (XYLOCAINE ) 1 % injection 5 mL (has no administration in time range)  heparin  injection 1,000 Units (has no administration in time range)  alteplase  (CATHFLO ACTIVASE ) injection 2 mg (has no administration in time range)  sevelamer  carbonate (RENVELA ) tablet 2,400 mg (2,400 mg Oral Not Given 01/14/24 1220)  calcitRIOL  (ROCALTROL ) capsule 0.75 mcg (has no administration in time range)  heparin  injection 5,000 Units (has no administration in time range)  albuterol  (PROVENTIL ) (2.5 MG/3ML) 0.083% nebulizer solution 2.5 mg (has no administration in time range)  carvedilol  (COREG ) tablet 25 mg (has no administration in time range)  cinacalcet  (SENSIPAR ) tablet 30 mg (has no administration in time range)  levETIRAcetam  (KEPPRA ) tablet 1,000 mg (1,000 mg Oral Not Given 01/14/24 1221)  losartan  (COZAAR ) tablet 25 mg (25 mg Oral Not Given 01/14/24 1221)    Clinical Course as of  01/14/24 1305  Thu Jan 14, 2024  0849 CXR reviewed by me: Trachea midline.  No pneumothorax.  Cardiomegaly present.  Bilateral pleural effusion left greater than right.  Pulmonary edema present.  No focal consolidation [AG]  [id number] Nephrology following. Hospitalist paged.  [AG]  [phone] Reassessment: Bibasilar rales improved, work of breathing within normal limits.  Tachycardia resolved [AG]  0934 Bedside echo performed: EF within normal limits, small pericardial effusion without tamponade physiology.  IVC plethoric.  Diffuse B-lines noted on thoracic ultrasound [AG]  1141 Hand off to hospitalist given- dispo admit 2/2 AHRF [AG]    Clinical Course User Index [AG] Nada Chroman, DO                                 Medical Decision Making Amount and/or Complexity of Data Reviewed Labs: ordered. Radiology: ordered.  Risk Decision regarding hospitalization.   On initial evaluation patient is tachycardic  however normotensive and hypoxic on room air necessitating 4.5 L nasal cannula.  Patient does have evidence of bibasilar crackles on pulmonary auscultation however is currently saturating well on 4.5 L nasal cannula without respiratory distress therefore will not start patient on BiPAP at this time however low threshold to begin BiPAP if patient's hypoxia worsens or work of breathing increases.  Differential diagnoses include acute on chronic heart failure exacerbation, acute pulmonary edema secondary to hypervolemia in the setting of ESRD, infection/sepsis, anemia, electrolyte abnormality, pulmonary embolism.  During evaluation, patient did become hypoxic on 5 L nasal cannula therefore we will place patient on BiPAP.  Multiple attempts to obtain peripheral IV access unsuccessful therefore inability to collect labs at this time, IV team consulted.  Do not believe patient's presentation is secondary to pulmonary embolism at this time as patient has obvious pulmonary edema on chest x-ray imaging with diffuse B-lines on thoracic ultrasound without evidence of RV strain on cardiac ultrasound. Nephrology contacted and patient will undergo dialysis today, anticipate improvement in acute hypoxic respiratory failure however patient will need reevaluation and further observation after dialysis therefore will admit patient to hospitalist team secondary to acute hypoxic respiratory failure in the setting of acute pulmonary edema.       Final diagnoses:  Acute hypoxic respiratory failure (HCC)  Acute pulmonary edema [facility])    ED Discharge Orders     None      Chroman Nada DO Emergency Medicine PGY2    Nada Chroman, DO 01/14/24 1306    Pamella Ozell LABOR, DO 01/23/24 1651

## 2024-01-14 NOTE — Progress Notes (Addendum)
 Informed of critical Hgb of 6 but I am not convinced this is accurate. Reviewed outside medical records at her outpatient HD center. Last Hgb from 7/26 was 8.4 in and overall trend appeared stable. Discussed with Hospitalist. Plan to repeat another CBC after HD today. Hold off on blood transfusion for now until review of repeat Hgb result.  Charmaine Piety, NP [facility] with extender above.  Will go ahead and Repeat Hb now and plan for PRBC's if less than 7   Katheryn JAYSON Saba, MD 4:41 PM 01/14/2024

## 2024-01-14 NOTE — Progress Notes (Signed)
 Received patient in bed.Alert and oriented x 4. She signed her hd treatment's consent.She was not on bi-pap instead she's on 6 lpm/[place].not on respiratory distress.  Access used: Right upper arm avg that worked well.  Duration of treatment: 3.25 hours.  Net uf : 3.2 Liters.  Hemo comment: Not tolerating the 4 liters uf,thus net uf of 3.2 liters only.  Hand off to the patient's nurse ,admitted into her room with stable condition via transporter.

## 2024-01-14 NOTE — Progress Notes (Signed)
 Pt receives OP HD at [facility] clinic, TTS, chair time being 0600. Will assist as needed.   Tandy Grawe Dialysis Navigator [phone]

## 2024-01-14 NOTE — ED Notes (Signed)
 RT called to place pt on bipap per DR Pamella orders

## 2024-01-14 NOTE — ED Notes (Signed)
 Sputum sample at bedside

## 2024-01-14 NOTE — Progress Notes (Signed)
 IVT consult placed for peripheral access. Patient to receive urgent dialysis today per MD note. No IV meds ordered at this time - no indication for peripheral access at this time. Labs that are ordered can be collected at dialysis to avoid damaging peripheral vasculature. Completing IVT consult at this time - please place new consult if needs change.   Maeryn Mcgath R Marycarmen Hagey, RN

## 2024-01-14 NOTE — Consult Note (Signed)
 ESRD Consult Note  Requesting provider: [employer] requesting consult: Hospitalist Reason for consult: ESRD, provision of dialysis Indication for acute dialysis?: End Stage Renal Disease  Outpatient dialysis unit: [facility] Outpatient dialysis prescription: TTS, F160, BFR 350, EDW 52, 3hr 45 min, 2K, 2ca, RUE AVG, mircera 75mcg given 7/29, calcitriol  0.66mcg with treatments, sevelamer  3 with meals  Assessment/Recommendations: Madeline Brown is a/an 48 y.o. female with a past medical history notable for ESRD on HD admitted with shortness of breath.   # ESRD: Dialysis today urgently for volume removal.  Reassess for dialysis needs tomorrow  # Volume/ hypertension: EDW 2.  Likely will need to be challenged.  Continue home blood pressure medications  # Anemia of Chronic Kidney Disease: Hemoglobin 9.7.  Follow-up repeat tomorrow.  Consider iron  studies.  Reduce ESA in 2 weeks.   # Secondary Hyperparathyroidism/Hyperphosphatemia: Continue home calcitriol  on dialysis days  # Vascular access: No issues  # Acute hypoxic respiratory failure: Associate with pulmonary edema.  Urgent dialysis as above  # Additional recommendations: - Dose all meds for creatinine clearance < 10 ml/min  - Unless absolutely necessary, no MRIs with gadolinium.  - Implement save arm precautions.  Prefer needle sticks in the dorsum of the hands or wrists.  No blood pressure measurements in arm. - If blood transfusion is requested during hemodialysis sessions, please alert us  prior to the session.  - Use synthetic opioids (Fentanyl /Dilaudid ) if needed  Recommendations were discussed with the primary team.   History of Present Illness: Madeline Brown is a/an 48 y.o. female with a past medical history of ESRD who presents with shortness of breath  Because of the patient's significant shortness of breath she has difficulty providing history.  History was largely obtained per chart review.  Patient been having  shortness of breath for about 2 weeks that been getting worse.  She normally wears oxygen at home but had to increase the amount she was using.  She also has had some chest pain with her shortness of breath and describes it as pleuritic.  She denied fevers, chills, nausea, vomiting, diarrhea.  In the emergency is not seen in room she was noted to be significantly short of breath and was placed on BiPAP.  Chest x-ray with significant pulmonary edema.  Labs were relatively reassuring.  She is my dialysis patient and I know her to be an excellent patient with good compliance.  However it does appear she missed a treatment on 7/24.  I am not sure the reason for this.  Her dry weight is set at 52 and she has not been achieving that at that status.  Likely due to limitations of how much fluid can be removed during her session.  She has been completing sessions.   Medications:  Current Facility-Administered Medications  Medication Dose Route Frequency Provider Last Rate Last Admin   Chlorhexidine  Gluconate Cloth 2 % PADS 6 each  6 each Topical Q0600 Lenon Charmaine BRAVO, NP       sodium chloride  flush (NS) 0.9 % injection 3 mL  3 mL Intravenous Q12H Nishan, Peter C, MD       Current Outpatient Medications  Medication Sig Dispense Refill   acetaminophen  (TYLENOL ) 500 MG tablet Take 500-1,000 mg by mouth every 6 (six) hours as needed for mild pain or headache.     albuterol  (VENTOLIN  HFA) 108 (90 Base) MCG/ACT inhaler Inhale 1-2 puffs into the lungs every 6 (six) hours as needed for wheezing or shortness of breath. 18  g 0   amLODipine  (NORVASC ) 10 MG tablet Take 1 tablet (10 mg total) by mouth at bedtime. 30 tablet 0   B Complex-C-Folic Acid  (DIALYVITE TABLET) TABS Take 1 tablet by mouth daily.     benzonatate  (TESSALON ) 100 MG capsule Take 1 capsule (100 mg total) by mouth every 8 (eight) hours. 21 capsule 0   CALCITRIOL  PO Take 1 tablet by mouth.     carvedilol  (COREG ) 25 MG tablet Take 1 tablet (25 mg  total) by mouth 2 (two) times daily with a meal. Take 25 mg by mouth at 6:30 AM and 6:00 PM on Sun/Mon/Wed/Fri and 25 mg at 11:00 AM and 8:00 PM on Tues/Thurs/Sat     cinacalcet  (SENSIPAR ) 30 MG tablet Take 30 mg by mouth daily.     hydrALAZINE  (APRESOLINE ) 100 MG tablet Take 1 tablet (100 mg total) by mouth 3 (three) times daily. (Patient taking differently: Take 100 mg by mouth 2 (two) times daily.) 90 tablet 0   levETIRAcetam  (KEPPRA ) 1000 MG tablet Take 1,000 mg by mouth daily.     losartan  (COZAAR ) 25 MG tablet Take 1 tablet by mouth daily.     Methoxy PEG-Epoetin Beta (MIRCERA IJ) Mircera     sevelamer  carbonate (RENVELA ) 800 MG tablet Take 1,600-2,400 mg by mouth 3 (three) times daily with meals. 800 mg with snack twice a day     spironolactone  (ALDACTONE ) 50 MG tablet Take 1 tablet (50 mg total) by mouth daily. 30 tablet 0   VELPHORO  500 MG chewable tablet Chew 500 mg by mouth 3 (three) times daily.       ALLERGIES Glycerin-hypromellose-peg 400 and Visine [tetrahydrozoline hcl]  MEDICAL HISTORY 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   History of blood transfusion 07/2020   Hypertension    Intracranial hemorrhage (HCC)    LVH (left ventricular hypertrophy)    Pericardial effusion    Pneumonia    Pulmonary hypertension (HCC)    Seizure (HCC) 02/2019   last one was in 02/2019   Shortness of breath 07/15/2018   on 3L via [place] prn   Stroke (HCC)    Mild was told after having a seizure   Substance abuse (HCC)    Marijuana   Thrombocytopenia (HCC)      SOCIAL HISTORY Social History   Socioeconomic History   Marital status: Single    Spouse name: Not on file   Number of children: Not on file   Years of education: Not on file   Highest education level: Not on file  Occupational History   Not on file  Tobacco Use   Smoking status: Former    Current packs/day: 0.25    Average packs/day: 0.3  packs/day for 29.6 years (7.4 ttl pk-yrs)    Types: Cigarettes    Start date: 1996    Passive exposure: Current   Smokeless tobacco: Never   Tobacco comments:    Cutting back to 1/4 ppd  Vaping Use   Vaping status: Never Used  Substance and Sexual Activity   Alcohol use: Not Currently    Comment: occassional beer   Drug use: Yes    Types: Marijuana    Comment: OCASSIONAL, LAST TIME 10/16/22   Sexual activity: Yes    Birth control/protection: Post-menopausal  Other Topics Concern   Not on file  Social History Narrative   Not on file  Social Drivers of Corporate investment banker Strain: Low Risk  (11/03/2022)   Received from [facility]   Overall Financial Resource Strain (CARDIA)    Difficulty of Paying Living Expenses: Not hard at all  Food Insecurity: No Food Insecurity (08/30/2023)   Hunger Vital Sign    Worried About Running Out of Food in the Last Year: Never true    Ran Out of Food in the Last Year: Never true  Transportation Needs: No Transportation Needs (08/30/2023)   PRAPARE - Administrator, Civil Service (Medical): No    Lack of Transportation (Non-Medical): No  Physical Activity: Not on file  Stress: Not on file  Social Connections: Not on file  Intimate Partner Violence: Not At Risk (08/30/2023)   Humiliation, Afraid, Rape, and Kick questionnaire    Fear of Current or Ex-Partner: No    Emotionally Abused: No    Physically Abused: No    Sexually Abused: No     FAMILY HISTORY Family History  Problem Relation Age of Onset   Diabetes Father    Hypertension Father    Cancer Father    Heart disease Father    Aneurysm Mother    Seizures Mother      Review of Systems: unable to obtain due to the patient's significant shortness of breath and difficulty talking  Physical Exam: Vitals:   01/14/24 0930 01/14/24 1015  BP: 122/74 108/80  Pulse: 87 88  Resp: 16 17  Temp:    SpO2: 100% 100%   No intake/output data recorded. No intake or  output data in the 24 hours ending 01/14/24 1029 General: Acutely ill-appearing, sitting in bed, respiratory distress HEENT: anicteric sclera, MMM CV: normal rate, no rub, no lower extremity edema Lungs: bilateral chest rise, increased work of breathing on BiPAP Abd: soft, non-tender, non-distended Skin: no visible lesions or rashes Psych: alert, engaged, appropriate mood and affect Neuro: normal speech, no gross focal deficits   Test Results Reviewed Lab Results  Component Value Date   NA 135 12/13/2023   K 3.9 12/13/2023   CL 93 (L) 12/13/2023   CO2 28 12/13/2023   BUN 47 (H) 12/13/2023   CREATININE 8.28 (H) 12/13/2023   CALCIUM  9.4 12/13/2023   ALBUMIN 3.2 (L) 08/31/2023   PHOS 2.4 (L) 08/31/2023    I have reviewed relevant outside healthcare records

## 2024-01-14 NOTE — ED Triage Notes (Signed)
 Pt coming from kidney center complaints of SOB x 2 wks. Reports a cough for same time. Pt did not get dialysis today, last session Tuesday.  EMS 122/PALP HR74 pt alert and oriented.

## 2024-01-14 NOTE — H&P (Addendum)
 History and Physical    Patient: Madeline Brown FMW:[id number] DOB: 19-Oct-1975 DOA: 01/14/2024 DOS: the patient was seen and examined on 01/14/2024 PCP: [facility], [facility]  Patient coming from: Home  Chief Complaint:  Chief Complaint  Patient presents with   Shortness of Breath   HPI: Madeline Brown is a 48 y.o. female with medical history significant of ESRD, HFpEF, HTN, and sz p/w DOE and found to have hypervolemia 2/2 ESRD.  Pt was in USOH until having SOB over the past 2 weeks. Pt reports faithful adherence to her dialysis schedule; in fact, she reports being 55.4kg prior to HD on this past Tuesday, having 2.6L removed, and leaving with a weight of 53.4kg (dry weight is 52kg per pt). Of note, pt thinks her dry weight may have decreased, as she has continued to have SOB despite HD over the past 2 weeks. She in somewhat compliant with a low sodium diet.  In the ED, pt tachycardic and tachypneic on BiPAP and titrated to 5L [place]. Labs notable for Cr 8.28, and BNP 201. CXR showed pulmonary edema w/ b/l pleural effusions. EDP consulted Nephrology for HD who recommended medicine admission.  Review of Systems: As mentioned in the history of present illness. All other systems reviewed and are negative. 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   History of blood transfusion 07/2020   Hypertension    Intracranial hemorrhage (HCC)    LVH (left ventricular hypertrophy)    Pericardial effusion    Pneumonia    Pulmonary hypertension (HCC)    Seizure (HCC) 02/2019   last one was in 02/2019   Shortness of breath 07/15/2018   on 3L via [place] prn   Stroke (HCC)    Mild was told after having a seizure   Substance abuse (HCC)    Marijuana   Thrombocytopenia (HCC)    Past Surgical History:  Procedure Laterality Date   AV FISTULA PLACEMENT Left 06/24/2017   Procedure: CREATION OF LEFT ARM BRACHIALCEPHALIC  ARTERIOVENOUS  (AV) FISTULA;  Surgeon: Oris Krystal FALCON, MD;  Location: [facility] OR;  Service: Vascular;  Laterality: Left;   AV FISTULA PLACEMENT Right 10/20/2022   Procedure: INSERTION OF RIGHT ARM ARTERIOVENOUS (AV) GORE-TEX GRAFT;  Surgeon: Magda Debby SAILOR, MD;  Location: [facility] OR;  Service: Vascular;  Laterality: Right;   CARDIAC CATHETERIZATION     CESAREAN SECTION     x2   COMPLEX WOUND CLOSURE Left 07/30/2020   Procedure: COMPLEX WOUND CLOSURE;  Surgeon: Magda Debby SAILOR, MD;  Location: [facility] OR;  Service: Vascular;  Laterality: Left;   FISTULA SUPERFICIALIZATION Left 10/28/2017   Procedure: SUPERFICIALIZATION LEFT BRACHIOCEPHALIC ARTERIOVENOUS FISTULA;  Surgeon: Oris Krystal FALCON, MD;  Location: [facility] OR;  Service: Vascular;  Laterality: Left;   INGUINAL HERNIA REPAIR Bilateral 02/01/2021   Procedure: LAPAROSCOPIC BILATERAL INGUINAL HERNIA REPAIR WITH MESH;  Surgeon: Kinsinger, Herlene Righter, MD;  Location: [facility] OR[facility];  Service: General;  Laterality: Bilateral;   INSERTION OF DIALYSIS CATHETER Right 06/24/2017   Procedure: INSERTION OF TUNNELED DIALYSIS CATHETER;  Surgeon: Oris Krystal FALCON, MD;  Location: [facility];  Service: Vascular;  Laterality: Right;   INSERTION OF DIALYSIS CATHETER Right 07/30/2020   Procedure: INSERTION OF DIALYSIS CATHETER;  Surgeon: Magda Debby SAILOR, MD;  Location: [facility];  Service: Vascular;  Laterality: Right;   INSERTION OF DIALYSIS CATHETER Right 08/06/2022   Procedure: INSERTION OF  TUNNELED DIALYSIS CATHETER;  Surgeon: Magda Debby SAILOR, MD;  Location: [facility] OR;  Service: Vascular;  Laterality: Right;   LIGATION OF COMPETING BRANCHES OF ARTERIOVENOUS FISTULA  10/28/2017   Procedure: LIGATION OF COMPETING BRANCHES OF ARTERIOVENOUS FISTULA x3;  Surgeon: Oris Krystal FALCON, MD;  Location: [facility];  Service: Vascular;;   REVISON OF ARTERIOVENOUS FISTULA Left 07/30/2020   Procedure: LEFT UPPER EXTREMITY ARTERIOVENOUS FISTULA REVISON;  Surgeon: Magda Debby SAILOR, MD;  Location: [facility];  Service: Vascular;  Laterality: Left;   PERIPHERAL NERVE BLOCK   REVISON OF ARTERIOVENOUS FISTULA Left 08/06/2022   Procedure: LEFT UPPER EXTREMITY FISTULA LIGATION;  Surgeon: Magda Debby SAILOR, MD;  Location: [facility];  Service: Vascular;  Laterality: Left;   RIGHT HEART CATH N/A 07/09/2022   Procedure: RIGHT HEART CATH;  Surgeon: Gardenia Led, DO;  [facility]: [facility];  Service: Cardiovascular;  Laterality: N/A;   Social History:  reports that she has quit smoking. Her smoking use included cigarettes. She started smoking about 29 years ago. She has a 7.4 pack-year smoking history. She has been exposed to tobacco smoke. She has never used smokeless tobacco. She reports that she does not currently use alcohol. She reports current drug use. Drug: Marijuana.  Allergies  Allergen Reactions   Glycerin-Hypromellose-Peg 400 Swelling   Visine [Tetrahydrozoline Hcl] Swelling and Other (See Comments)    Eyes Swell    Family History  Problem Relation Age of Onset   Diabetes Father    Hypertension Father    Cancer Father    Heart disease Father    Aneurysm Mother    Seizures Mother     Prior to Admission medications   Medication Sig Start Date End Date Taking? Authorizing Provider  acetaminophen  (TYLENOL ) 500 MG tablet Take 500-1,000 mg by mouth every 6 (six) hours as needed for mild pain or headache.    [provider]  albuterol  (VENTOLIN  HFA) 108 (90 Base) MCG/ACT inhaler Inhale 1-2 puffs into the lungs every 6 (six) hours as needed for wheezing or shortness of breath. 12/14/23   Theotis Peers M, PA-C  amLODipine  (NORVASC ) 10 MG tablet Take 1 tablet (10 mg total) by mouth at bedtime. 12/09/19 01/06/24  Aslam, Sadia, MD  B Complex-C-Folic Acid  (DIALYVITE TABLET) TABS Take 1 tablet by mouth daily.    [provider]  benzonatate  (TESSALON ) 100 MG capsule Take 1 capsule (100 mg total) by mouth every 8 (eight) hours. 12/14/23   Theotis Peers M, PA-C  CALCITRIOL  PO Take 1 tablet by mouth.    [provider]  carvedilol  (COREG ) 25 MG tablet Take 1 tablet (25 mg total) by mouth 2 (two) times daily with a meal. Take 25 mg by mouth at 6:30 AM and 6:00 PM on Sun/Mon/Wed/Fri and 25 mg at 11:00 AM and 8:00 PM on Tues/Thurs/Sat 08/06/22 01/06/24  Rhyne, Samantha J, PA-C  cinacalcet  (SENSIPAR ) 30 MG tablet Take 30 mg by mouth daily.    [provider]  hydrALAZINE  (APRESOLINE ) 100 MG tablet Take 1 tablet (100 mg total) by mouth 3 (three) times daily. Patient taking differently: Take 100 mg by mouth 2 (two) times daily. 12/09/19 01/06/24  Aslam, Sadia, MD  levETIRAcetam  (KEPPRA ) 1000 MG tablet Take 1,000 mg by mouth daily.    [provider]  losartan  (COZAAR ) 25 MG tablet Take 1 tablet by mouth daily. 01/05/24   [provider]  Methoxy PEG-Epoetin Beta (MIRCERA IJ) Mircera 06/16/23 06/14/24  [provider]  sevelamer  carbonate (RENVELA )  800 MG tablet Take 1,600-2,400 mg by mouth 3 (three) times daily with meals. 800 mg with snack twice a day 02/10/23   [provider]  spironolactone  (ALDACTONE ) 50 MG tablet Take 1 tablet (50 mg total) by mouth daily. 12/09/19   Aslam, Sadia, MD  VELPHORO  500 MG chewable tablet Chew 500 mg by mouth 3 (three) times daily.    [provider]    Physical Exam: Vitals:   01/14/24 1015 01/14/24 1030 01/14/24 1100 01/14/24 1115  BP: 108/80  (!) 121/92 117/84  Pulse: 88  82 83  Resp: 17  (!) 21 18  Temp:  99.1 F (37.3 C)    TempSrc:      SpO2: 100%  100% 100%   General: Alert, oriented x3, resting comfortably in no acute distress Respiratory: Bibasilar rales; no wheezing Cardiovascular: Regular rate and rhythm w/o m/r/g   Data Reviewed:  Lab Results  Component Value Date   WBC 4.4 12/13/2023   HGB 9.7 (L) 12/13/2023   HCT 30.5 (L) 12/13/2023   MCV 104.5 (H) 12/13/2023   PLT 104 (L) 12/13/2023   Lab Results  Component Value Date   GLUCOSE 79 12/13/2023   CALCIUM  9.4 12/13/2023   NA 135 12/13/2023    K 3.9 12/13/2023   CO2 28 12/13/2023   CL 93 (L) 12/13/2023   BUN 47 (H) 12/13/2023   CREATININE 8.28 (H) 12/13/2023   Lab Results  Component Value Date   ALT 10 06/30/2023   AST 16 06/30/2023   ALKPHOS 35 (L) 06/30/2023   BILITOT 0.8 06/30/2023   Lab Results  Component Value Date   INR 1.14 06/24/2017   INR 1.07 06/19/2017   INR 1.07 02/13/2017    Radiology: DG Chest Portable 1 View Result Date: 01/14/2024 CLINICAL DATA:  48 year old female with shortness of breath and cough. Tachycardia. End stage renal disease. EXAM: PORTABLE CHEST 1 VIEW COMPARISON:  Chest radiograph 12/13/2023 and earlier. FINDINGS: Portable AP upright view at 0744 hours. Probably external round metallic device projecting over the left upper chest now. Stable cardiomegaly and mediastinal contours. Small but increased bilateral pleural effusions. Increased bilateral pulmonary vascular congestion, symmetric interstitial opacity. No pneumothorax. No air bronchograms. Visualized tracheal air column is within normal limits. No acute osseous abnormality identified. Dextroconvex scoliosis. Negative visible bowel gas. IMPRESSION: Acute pulmonary edema with small pleural effusions, progressed since 12/13/2023. Chronic cardiomegaly. Electronically Signed   By: VEAR Hurst M.D.   On: 01/14/2024 07:57    Assessment and Plan: 37F h/o ESRD, HFpEF, HTN, and sz p/w DOE and found to have hypervolemia 2/2 ESRD.  Volume overload 2/2 ESRD H/o HFpEF -Renal consulted for HD; apprec eval/recs -PTA sevelamer , cinacalcet , and calcitriol   Anemia Hb 6.1 after late afternoon blood draw; pt with poor access and hard stick per RN -F/u CBC post-HD at 1800 and determine if blood transfusion is indicated; pt with nl Hb (8.4) on Tuesday per renal team  HTN -PTA losartan  25mg  daily -HOLD pta amlodipine  and hydralazine  for now; resume as needed or maximize ARB  H/o sz -PTA keppra  1000mg  daily   Advance Care Planning:   Code Status: Full  Code   Consults: Renal  Family Communication: N/A  Severity of Illness: The appropriate patient status for this patient is INPATIENT. Inpatient status is judged to be reasonable and necessary in order to provide the required intensity of service to ensure the patient's safety. The patient's presenting symptoms, physical exam findings, and initial radiographic and laboratory data in  the context of their chronic comorbidities is felt to place them at high risk for further clinical deterioration. Furthermore, it is not anticipated that the patient will be medically stable for discharge from the hospital within 2 midnights of admission.   * I certify that at the point of admission it is my clinical judgment that the patient will require inpatient hospital care spanning beyond 2 midnights from the point of admission due to high intensity of service, high risk for further deterioration and high frequency of surveillance required.*   ------- I spent 55 minutes reviewing previous labs/notes, obtaining separate history at the bedside, counseling/discussing the treatment plan outlined above, ordering medications/tests, and performing clinical documentation.  Author: Marsha Ada, MD 01/14/2024 11:43 AM  For on call review [email or web address].

## 2024-01-14 NOTE — Progress Notes (Signed)
   01/14/24 1307  Respiratory Assessment  Respiratory Pattern Regular;Unlabored  Chest Assessment Chest expansion symmetrical  Bilateral Breath Sounds Clear;Coarse crackles  R Upper  Breath Sounds Clear  L Upper Breath Sounds Clear  R Lower Breath Sounds Coarse crackles  L Lower Breath Sounds Coarse crackles  Oxygen Therapy/Pulse Ox  O2 Device (S)  Nasal Cannula  O2 Flow Rate (L/min) 5 L/min  FiO2 (%) 96 %   Pt transitioned form BiPAP to 5L [place] and is tolerating well at this time. Pt denies SOB and states her breathing is much better. BiPAP on standby if needed.

## 2024-01-15 ENCOUNTER — Other Ambulatory Visit: Payer: Self-pay

## 2024-01-15 ENCOUNTER — Encounter (HOSPITAL_COMMUNITY): Payer: Self-pay | Admitting: Hospitalist

## 2024-01-15 DIAGNOSIS — J9601 Acute respiratory failure with hypoxia: Secondary | ICD-10-CM | POA: Diagnosis not present

## 2024-01-15 DIAGNOSIS — N185 Chronic kidney disease, stage 5: Secondary | ICD-10-CM

## 2024-01-15 DIAGNOSIS — I10 Essential (primary) hypertension: Secondary | ICD-10-CM

## 2024-01-15 DIAGNOSIS — E8779 Other fluid overload: Secondary | ICD-10-CM

## 2024-01-15 DIAGNOSIS — D631 Anemia in chronic kidney disease: Secondary | ICD-10-CM

## 2024-01-15 DIAGNOSIS — G40909 Epilepsy, unspecified, not intractable, without status epilepticus: Secondary | ICD-10-CM

## 2024-01-15 DIAGNOSIS — Z992 Dependence on renal dialysis: Secondary | ICD-10-CM

## 2024-01-15 LAB — HEPATITIS B SURFACE ANTIBODY, QUANTITATIVE: Hep B S AB Quant (Post): 917 m[IU]/mL

## 2024-01-15 LAB — CBC
HCT: 21.5 % — ABNORMAL LOW (ref 36.0–46.0)
Hemoglobin: 7 g/dL — ABNORMAL LOW (ref 12.0–15.0)
MCH: 32.7 pg (ref 26.0–34.0)
MCHC: 32.6 g/dL (ref 30.0–36.0)
MCV: 100.5 fL — ABNORMAL HIGH (ref 80.0–100.0)
Platelets: 126 K/uL — ABNORMAL LOW (ref 150–400)
RBC: 2.14 MIL/uL — ABNORMAL LOW (ref 3.87–5.11)
RDW: 15.7 % — ABNORMAL HIGH (ref 11.5–15.5)
WBC: 5.4 K/uL (ref 4.0–10.5)
nRBC: 0 % (ref 0.0–0.2)

## 2024-01-15 LAB — FERRITIN: Ferritin: 769 ng/mL — ABNORMAL HIGH (ref 11–307)

## 2024-01-15 LAB — PREPARE RBC (CROSSMATCH)

## 2024-01-15 LAB — IRON AND TIBC
Iron: 24 ug/dL — ABNORMAL LOW (ref 28–170)
Saturation Ratios: 12 % (ref 10.4–31.8)
TIBC: 197 ug/dL — ABNORMAL LOW (ref 250–450)
UIBC: 173 ug/dL

## 2024-01-15 MED ORDER — IRON SUCROSE 500 MG IVPB - SIMPLE MED
500.0000 mg | Freq: Every day | INTRAVENOUS | Status: DC
  Filled 2024-01-15: qty 275

## 2024-01-15 MED ORDER — SODIUM CHLORIDE 0.9 % IV SOLN
500.0000 mg | Freq: Every day | INTRAVENOUS | Status: AC
  Administered 2024-01-15 – 2024-01-16 (×2): 500 mg via INTRAVENOUS
  Filled 2024-01-15 (×2): qty 25

## 2024-01-15 MED ORDER — SODIUM CHLORIDE 0.9% IV SOLUTION: Freq: Once | INTRAVENOUS | Status: DC

## 2024-01-15 NOTE — Assessment & Plan Note (Signed)
 01-15-2024 due to volume overload. Pt does have home O2 concentrator but does not wear it continuously. Wean to RA.

## 2024-01-15 NOTE — Hospital Course (Signed)
 HPI: Madeline Brown is a 48 y.o. female with medical history significant of ESRD, HFpEF, HTN, and sz p/w DOE and found to have hypervolemia 2/2 ESRD.   Pt was in USOH until having SOB over the past 2 weeks. Pt reports faithful adherence to her dialysis schedule; in fact, she reports being 55.4kg prior to HD on this past Tuesday, having 2.6L removed, and leaving with a weight of 53.4kg (dry weight is 52kg per pt). Of note, pt thinks her dry weight may have decreased, as she has continued to have SOB despite HD over the past 2 weeks. She in somewhat compliant with a low sodium diet.   In the ED, pt tachycardic and tachypneic on BiPAP and titrated to 5L [place]. Labs notable for Cr 8.28, and BNP 201. CXR showed pulmonary edema w/ b/l pleural effusions. EDP consulted Nephrology for HD who recommended medicine admission.  Significant Events: Admitted 01/14/2024 for volume overload due to ESRD on HD 01-14-2024 placed on bipap at time of admission to ER due to respiratory failure. Went for emergent HD on evening of 01-14-2024. Nephrology did not believe HgB of 6.1 g/dl was accurate. Repeat HgB after HD was 7.6 01-15-2024 HgB of 7.0 g/dl  Admission Labs: Na 864, K 4.0, CO2 of 26, BUN 61, Scr 10.11, glu 120 WBC 4.3, HgB 6.1, plt 104 BNP 2111  Admission Imaging Studies: CXR Acute pulmonary edema with small pleural effusions, progressed since 12/13/2023. Chronic cardiomegaly.  Significant Labs:   Significant Imaging Studies:   Antibiotic Therapy: Anti-infectives (From admission, onward)    None       Procedures:   Consultants: nephrology

## 2024-01-15 NOTE — Assessment & Plan Note (Signed)
 01-15-2024 on keppra  100 mg qday

## 2024-01-15 NOTE — Progress Notes (Signed)
 Nephrology Follow-Up Consult note   Outpatient dialysis unit: [facility] Outpatient dialysis prescription: TTS, F160, BFR 350, EDW 52, 3hr 45 min, 2K, 2ca, RUE AVG, mircera 75mcg given 7/29, calcitriol  0.75mcg with treatments, sevelamer  3 with meals   Assessment/Recommendations: Madeline Brown is a/an 48 y.o. female with a past medical history notable for ESRD on HD admitted with shortness of breath.    # ESRD: Urgent dialysis on 7/31 for volume removal.  Plan for dialysis tomorrow.  Hopefully can discharge after dialysis tomorrow.  I will see if we can set up for an extra session at her outpatient unit on Monday for ongoing volume removal   # Volume/ hypertension: Likely with some weight loss.  Challenging dry weight with aggressive ultrafiltration as tolerated.  Continue home blood pressure medications   # Anemia of Chronic Kidney Disease: Hemoglobin was precipitous drop from admission.  Unclear why.  Continue to monitor during the morning.  Likely will need a transfusion before discharge.  Recently received Mircera send not due at this time.  Will check iron  studies today   # Secondary Hyperparathyroidism/Hyperphosphatemia: Continue home calcitriol  on dialysis days   # Vascular access: No issues   # Acute hypoxic respiratory failure: Associate with pulmonary edema.  Continue with dialysis as above   # Additional recommendations: - Dose all meds for creatinine clearance < 10 ml/min  - Unless absolutely necessary, no MRIs with gadolinium.  - Implement save arm precautions.  Prefer needle sticks in the dorsum of the hands or wrists.  No blood pressure measurements in arm. - If blood transfusion is requested during hemodialysis sessions, please alert us  prior to the session.  - Use synthetic opioids (Fentanyl /Dilaudid ) if needed   Recommendations were discussed with the primary team.   Recommendations conveyed to primary service.    Jayson JINNY Player [facility] 01/15/2024 9:52 AM  ___________________________________________________________  CC: Shortness of breath  Interval History/Subjective: Still with some shortness of breath but overall improved.  Tolerated dialysis yesterday with 3.2 L removed.  Oxygen requirement has decreased  Medications:  Current Facility-Administered Medications  Medication Dose Route Frequency Provider Last Rate Last Admin   albuterol  (PROVENTIL ) (2.5 MG/3ML) 0.083% nebulizer solution 2.5 mg  2.5 mg Inhalation Q6H PRN Georgina Basket, MD       calcitRIOL  (ROCALTROL ) capsule 0.75 mcg  0.75 mcg Oral Q M,W,F Kollins Fenter J, MD   0.75 mcg at 01/15/24 9161   carvedilol  (COREG ) tablet 25 mg  25 mg Oral BID WC Georgina Basket, MD   25 mg at 01/15/24 9160   Chlorhexidine  Gluconate Cloth 2 % PADS 6 each  6 each Topical Q0600 Lenon Charmaine BRAVO, NP   6 each at 01/15/24 0600   cinacalcet  (SENSIPAR ) tablet 30 mg  30 mg Oral Q breakfast Georgina Basket, MD   30 mg at 01/15/24 9160   heparin  injection 5,000 Units  5,000 Units Subcutaneous Q8H Georgina Basket, MD   5,000 Units at 01/15/24 9383   levETIRAcetam  (KEPPRA ) tablet 1,000 mg  1,000 mg Oral Daily Georgina Basket, MD   1,000 mg at 01/15/24 9160   losartan  (COZAAR ) tablet 25 mg  25 mg Oral Daily Georgina Basket, MD   25 mg at 01/15/24 9160   sevelamer  carbonate (RENVELA ) tablet 2,400 mg  2,400 mg Oral TID WC Player Jayson JINNY, MD   2,400 mg at 01/15/24 0840      Review of Systems: 10 systems reviewed and negative except per interval history/subjective  Physical Exam: Vitals:  01/15/24 0600 01/15/24 0727  BP:  110/82  Pulse: 95 89  Resp:  19  Temp:  98.4 F (36.9 C)  SpO2: 96% 96%   No intake/output data recorded.  Intake/Output Summary (Last 24 hours) at 01/15/2024 9047 Last data filed at 01/15/2024 9587 Gross per 24 hour  Intake 490 ml  Output 3200 ml  Net -2710 ml   Constitutional: well-appearing, no acute distress ENMT: ears and nose without scars or  lesions, MMM CV: normal rate, trace edema Respiratory: Bilateral chest rise with mild increased work of breathing Gastrointestinal: soft, non-tender, no palpable masses or hernias Skin: no visible lesions or rashes Psych: alert, judgement/insight appropriate, appropriate mood and affect   Test Results I personally reviewed new and old clinical labs and radiology tests Lab Results  Component Value Date   NA 135 01/14/2024   K 4.0 01/14/2024   CL 95 (L) 01/14/2024   CO2 26 01/14/2024   BUN 61 (H) 01/14/2024   CREATININE 10.11 (H) 01/14/2024   CALCIUM  9.2 01/14/2024   ALBUMIN 3.2 (L) 08/31/2023   PHOS 2.4 (L) 08/31/2023    CBC Recent Labs  Lab 01/14/24 1010 01/14/24 1646 01/15/24 0308  WBC 4.3 5.3 5.4  HGB 6.1* 7.6* 7.0*  HCT 19.1* 23.2* 21.5*  MCV 101.1* 98.7 100.5*  PLT 104* 142* 126*

## 2024-01-15 NOTE — Assessment & Plan Note (Addendum)
 01-15-2024 in addition to her anemia of CKD, it also appears she is iron  deficient.  Nephrology will be ordering IV iron . Will repeat CBC in AM. If HgB is still near 7 g/dl tomorrow AM, will plan to transfuse 1 unit PRBC during HD so that any volume can be removed with HD. I have already ordered type and cross/hold 1 unit PRBC in blood bank.  Pt would only need order to transfuse 1 unit PRBC tomorrow morning IF HgB is still around 7 g/dl tomorrow AM. Discussed with nephrology.  Iron /TIBC/Ferritin/ %Sat    Component Value Date/Time   IRON  24 (L) 01/14/2024 1011   TIBC 197 (L) 01/14/2024 1011   FERRITIN 769 (H) 01/14/2024 1011   IRONPCTSAT 12 01/14/2024 1011

## 2024-01-15 NOTE — Progress Notes (Signed)
 Heart Failure Navigator Progress Note  Assessed for Heart & Vascular TOC clinic readiness.  Patient does not meet criteria due to ESRD on hemodialysis. No HF TOC.   Navigator will sign off at this time.   Randie Bustle, BSN, Scientist, clinical (histocompatibility and immunogenetics) Only

## 2024-01-15 NOTE — Progress Notes (Signed)
 Mobility Specialist Progress Note:   01/15/24 1450  Mobility  Activity Ambulated with assistance  Level of Assistance Standby assist, set-up cues, supervision of patient - no hands on  Assistive Device None  Distance Ambulated (ft) 200 ft  Activity Response Tolerated well  Mobility Referral Yes  Mobility visit 1 Mobility  Mobility Specialist Start Time (ACUTE ONLY) 1450  Mobility Specialist Stop Time (ACUTE ONLY) 1510  Mobility Specialist Time Calculation (min) (ACUTE ONLY) 20 min   Pre Mobility: SpO2 100% 6L During Mobility: SpO2 80% RA; 90% 3L Post Mobility: SpO2 90% 6L  Pt agreeable to mobility session. Required no physical assistance throughout ambulation. Attempted ambulation on RA, pt desat to 80%. Required 6LO2 and short seated rest to recover. RN in room. Pt left on 6LO2 with all needs met.    Therisa Rana Mobility Specialist Please contact via SecureChat or  Rehab office at [phone]

## 2024-01-15 NOTE — Progress Notes (Signed)
 PROGRESS NOTE    Madeline Brown  FMW:[id number] DOB: Jun 27, 1975 DOA: 01/14/2024 PCP: [facility] [address] [facility]  Subjective: Pt seen and examined. She thinks her dry weight is too high. Nephrology notes reviewed. Plan for pt to get another HD treatment tomorrow and be discharged.   Nephrology coordinator has arranged for extra HD session at pt's home HD center for Monday AM on 01-18-2024.  Pt states she has home O2 but only uses it when she gets full of fluid. She still urinates about twice a day.   Hospital Course: HPI: Madeline Brown is a 48 y.o. female with medical history significant of ESRD, HFpEF, HTN, and sz p/w DOE and found to have hypervolemia 2/2 ESRD.   Pt was in USOH until having SOB over the past 2 weeks. Pt reports faithful adherence to her dialysis schedule; in fact, she reports being 55.4kg prior to HD on this past Tuesday, having 2.6L removed, and leaving with a weight of 53.4kg (dry weight is 52kg per pt). Of note, pt thinks her dry weight may have decreased, as she has continued to have SOB despite HD over the past 2 weeks. She in somewhat compliant with a low sodium diet.   In the ED, pt tachycardic and tachypneic on BiPAP and titrated to 5L . Labs notable for Cr 8.28, and BNP 201. CXR showed pulmonary edema w/ b/l pleural effusions. EDP consulted Nephrology for HD who recommended medicine admission.  Significant Events: Admitted 01/14/2024 for volume overload due to ESRD on HD 01-14-2024 placed on bipap at time of admission to ER due to respiratory failure. Went for emergent HD on evening of 01-14-2024. Nephrology did not believe HgB of 6.1 g/dl was accurate. Repeat HgB after HD was 7.6 01-15-2024 HgB of 7.0 g/dl  Admission Labs: Na 864, K 4.0, CO2 of 26, BUN 61, Scr 10.11, glu 120 WBC 4.3, HgB 6.1, plt 104 BNP 2111  Admission Imaging Studies: CXR Acute pulmonary edema with small pleural effusions, progressed since 12/13/2023. Chronic cardiomegaly.  Significant  Labs:   Significant Imaging Studies:   Antibiotic Therapy: Anti-infectives (From admission, onward)    None       Procedures:   Consultants: nephrology    Assessment and Plan: * Acute hypoxemic respiratory failure (HCC) 01-15-2024 due to volume overload. Pt does have home O2 concentrator but does not wear it continuously. Wean to RA.  Volume overload 01-15-2024 She thinks her dry weight is too high. Nephrology notes reviewed. Plan for pt to get another HD treatment tomorrow and be discharged.  Nephrology coordinator has arranged for extra HD session at pt's home HD center for Monday AM on 01-18-2024.  Anemia of chronic kidney failure, stage 5 (HCC) 01-15-2024 in addition to her anemia of CKD, it also appears she is iron  deficient.  Nephrology will be ordering IV iron . Will repeat CBC in AM. If HgB is still near 7 g/dl tomorrow AM, will plan to transfuse 1 unit PRBC during HD so that any volume can be removed with HD. I have already ordered type and cross/hold 1 unit PRBC in blood bank.  Pt would only need order to transfuse 1 unit PRBC tomorrow morning IF HgB is still around 7 g/dl tomorrow AM. Discussed with nephrology.  Iron /TIBC/Ferritin/ %Sat    Component Value Date/Time   IRON  24 (L) 01/14/2024 1011   TIBC 197 (L) 01/14/2024 1011   FERRITIN 769 (H) 01/14/2024 1011   IRONPCTSAT 12 01/14/2024 1011     Seizure disorder (HCC) 01-15-2024 on keppra   100 mg qday  ESRD on dialysis [facility]- on T, Th, Sat 01-15-2024 She thinks her dry weight is too high. Nephrology notes reviewed. Plan for pt to get another HD treatment tomorrow and be discharged.  Nephrology coordinator has arranged for extra HD session at pt's home HD center for Monday AM on 01-18-2024.  Essential hypertension 01-15-2024 BP meds on hold due to low BP today. Will need to re-evaluate HTN meds at discharge.  DVT prophylaxis: heparin  injection 5,000 Units Start: 01/14/24 1400    Code Status: Full Code Family  Communication: no family at bedside. Pt is decisional. Disposition Plan: return home Reason for continuing need for hospitalization: getting additional HD session tomorrow. Possibly PRBC transfusion during HD. Possible home in the tomorrow afternoon.  Objective: Vitals:   01/15/24 0500 01/15/24 0600 01/15/24 0727 01/15/24 1038  BP:   110/82 (!) 87/66  Pulse: 88 95 89 87  Resp:   19 19  Temp:   98.4 F (36.9 C) 98.8 F (37.1 C)  TempSrc:   Oral Oral  SpO2: 95% 96% 96% 98%  Weight:  51 kg    Height:        Intake/Output Summary (Last 24 hours) at 01/15/2024 1336 Last data filed at 01/15/2024 0412 Gross per 24 hour  Intake 490 ml  Output 3200 ml  Net -2710 ml   Filed Weights   01/14/24 1850 01/14/24 1922 01/15/24 0600  Weight: 51.1 kg 50.4 kg 51 kg    Examination:  Physical Exam Vitals and nursing note reviewed.  Constitutional:      General: She is not in acute distress.    Appearance: She is normal weight. She is not toxic-appearing or diaphoretic.  HENT:     Head: Normocephalic and atraumatic.     Nose: Nose normal.  Eyes:     General: No scleral icterus. Cardiovascular:     Rate and Rhythm: Normal rate and regular rhythm.  Pulmonary:     Effort: Pulmonary effort is normal. No respiratory distress.     Breath sounds: Normal breath sounds. No wheezing or rales.  Abdominal:     General: Abdomen is flat. Bowel sounds are normal.     Palpations: Abdomen is soft.  Musculoskeletal:     Right lower leg: No edema.     Left lower leg: No edema.  Skin:    General: Skin is warm and dry.     Capillary Refill: Capillary refill takes less than 2 seconds.  Neurological:     General: No focal deficit present.     Mental Status: She is alert and oriented to person, place, and time.     Data Reviewed: I have personally reviewed following labs and imaging studies  CBC: Recent Labs  Lab 01/14/24 1010 01/14/24 1646 01/15/24 0308  WBC 4.3 5.3 5.4  HGB 6.1* 7.6* 7.0*   HCT 19.1* 23.2* 21.5*  MCV 101.1* 98.7 100.5*  PLT 104* 142* 126*   Basic Metabolic Panel: Recent Labs  Lab 01/14/24 1010  NA 135  K 4.0  CL 95*  CO2 26  GLUCOSE 120*  BUN 61*  CREATININE 10.11*  CALCIUM  9.2   GFR: Estimated Creatinine Clearance: 4.9 mL/min (A) (by C-G formula based on SCr of 10.11 mg/dL (H)). BNP (last 3 results) Recent Labs    08/30/23 1231 12/13/23 2137 01/14/24 0651  BNP 250.3* 201.2* 2,111.4*   Anemia Panel: Recent Labs    01/14/24 1011  FERRITIN 769*  TIBC 197*  IRON  24*  Radiology Studies: DG Chest Portable 1 View Result Date: 01/14/2024 CLINICAL DATA:  48 year old female with shortness of breath and cough. Tachycardia. End stage renal disease. EXAM: PORTABLE CHEST 1 VIEW COMPARISON:  Chest radiograph 12/13/2023 and earlier. FINDINGS: Portable AP upright view at 0744 hours. Probably external round metallic device projecting over the left upper chest now. Stable cardiomegaly and mediastinal contours. Small but increased bilateral pleural effusions. Increased bilateral pulmonary vascular congestion, symmetric interstitial opacity. No pneumothorax. No air bronchograms. Visualized tracheal air column is within normal limits. No acute osseous abnormality identified. Dextroconvex scoliosis. Negative visible bowel gas. IMPRESSION: Acute pulmonary edema with small pleural effusions, progressed since 12/13/2023. Chronic cardiomegaly. Electronically Signed   By: VEAR Hurst M.D.   On: 01/14/2024 07:57    Scheduled Meds:  sodium chloride    Intravenous Once   calcitRIOL   0.75 mcg Oral Q M,W,F   carvedilol   25 mg Oral BID WC   Chlorhexidine  Gluconate Cloth  6 each Topical Q0600   cinacalcet   30 mg Oral Q breakfast   heparin   5,000 Units Subcutaneous Q8H   levETIRAcetam   1,000 mg Oral Daily   sevelamer  carbonate  2,400 mg Oral TID WC   Continuous Infusions:  iron  sucrose       LOS: 1 day   Time spent: 60 minutes  Camellia Door, DO  [facility]  01/15/2024, 1:36 PM

## 2024-01-15 NOTE — Subjective & Objective (Signed)
 Pt seen and examined. She thinks her dry weight is too high. Nephrology notes reviewed. Plan for pt to get another HD treatment tomorrow and be discharged.   Nephrology coordinator has arranged for extra HD session at pt's home HD center for Monday AM on 01-18-2024.  Pt states she has home O2 but only uses it when she gets full of fluid. She still urinates about twice a day.

## 2024-01-15 NOTE — Assessment & Plan Note (Signed)
 01-15-2024 She thinks her dry weight is too high. Nephrology notes reviewed. Plan for pt to get another HD treatment tomorrow and be discharged.  Nephrology coordinator has arranged for extra HD session at pt's home HD center for Monday AM on 01-18-2024.

## 2024-01-15 NOTE — Assessment & Plan Note (Signed)
 01-15-2024 BP meds on hold due to low BP today. Will need to re-evaluate HTN meds at discharge.

## 2024-01-15 NOTE — Plan of Care (Signed)

## 2024-01-15 NOTE — Progress Notes (Signed)
 Contacted by nephrologist with request for extra HD treatment on Monday. Contacted this pt clinic, they stated they do have a chair available 0740 Monday. Pt would need to arrive 0730. Will update AVS at this time.   Lavanda Maron [facility] Navigator [phone]

## 2024-01-16 ENCOUNTER — Encounter: Payer: Self-pay | Admitting: Family Medicine

## 2024-01-16 ENCOUNTER — Other Ambulatory Visit (HOSPITAL_COMMUNITY): Payer: Self-pay

## 2024-01-16 DIAGNOSIS — J9601 Acute respiratory failure with hypoxia: Secondary | ICD-10-CM | POA: Diagnosis not present

## 2024-01-16 LAB — CBC WITH DIFFERENTIAL/PLATELET
Abs Granulocyte: 5 K/uL (ref 1.5–6.5)
Abs Immature Granulocytes: 0.02 K/uL (ref 0.00–0.07)
Basophils Absolute: 0 K/uL (ref 0.0–0.1)
Basophils Relative: 1 %
Eosinophils Absolute: 0.2 K/uL (ref 0.0–0.5)
Eosinophils Relative: 3 %
HCT: 19.7 % — ABNORMAL LOW (ref 36.0–46.0)
Hemoglobin: 6.4 g/dL — CL (ref 12.0–15.0)
Immature Granulocytes: 0 %
Lymphocytes Relative: 11 %
Lymphs Abs: 0.7 K/uL (ref 0.7–4.0)
MCH: 32.7 pg (ref 26.0–34.0)
MCHC: 32.5 g/dL (ref 30.0–36.0)
MCV: 100.5 fL — ABNORMAL HIGH (ref 80.0–100.0)
Monocytes Absolute: 0.4 K/uL (ref 0.1–1.0)
Monocytes Relative: 6 %
Neutro Abs: 5 K/uL (ref 1.7–7.7)
Neutrophils Relative %: 79 %
Platelets: 127 K/uL — ABNORMAL LOW (ref 150–400)
RBC: 1.96 MIL/uL — ABNORMAL LOW (ref 3.87–5.11)
RDW: 15.9 % — ABNORMAL HIGH (ref 11.5–15.5)
WBC: 6.3 K/uL (ref 4.0–10.5)
nRBC: 0 % (ref 0.0–0.2)

## 2024-01-16 LAB — RENAL FUNCTION PANEL
Albumin: 2.9 g/dL — ABNORMAL LOW (ref 3.5–5.0)
Anion gap: 12 (ref 5–15)
BUN: 21 mg/dL — ABNORMAL HIGH (ref 6–20)
CO2: 29 mmol/L (ref 22–32)
Calcium: 9.3 mg/dL (ref 8.9–10.3)
Chloride: 95 mmol/L — ABNORMAL LOW (ref 98–111)
Creatinine, Ser: 3.76 mg/dL — ABNORMAL HIGH (ref 0.44–1.00)
GFR, Estimated: 14 mL/min — ABNORMAL LOW (ref >60)
Glucose, Bld: 85 mg/dL (ref 70–99)
Phosphorus: 2.4 mg/dL — ABNORMAL LOW (ref 2.5–4.6)
Potassium: 3.4 mmol/L — ABNORMAL LOW (ref 3.5–5.1)
Sodium: 136 mmol/L (ref 135–145)

## 2024-01-16 LAB — PREPARE RBC (CROSSMATCH)

## 2024-01-16 MED ORDER — SODIUM CHLORIDE 0.9% IV SOLUTION: Freq: Once | INTRAVENOUS | Status: DC

## 2024-01-16 MED ORDER — ACETAMINOPHEN 325 MG PO TABS
650.0000 mg | ORAL_TABLET | Freq: Once | ORAL | Status: AC | PRN
  Administered 2024-01-16: 650 mg via ORAL
  Filled 2024-01-16: qty 2

## 2024-01-16 MED ORDER — CALCITRIOL 0.25 MCG PO CAPS
0.7500 ug | ORAL_CAPSULE | ORAL | 0 refills | Status: DC
  Filled 2024-01-16: qty 30, 23d supply, fill #0

## 2024-01-16 MED ORDER — ACETAMINOPHEN 500 MG PO TABS: 1000.0000 mg | ORAL_TABLET | Freq: Once | ORAL | Status: DC | PRN

## 2024-01-16 NOTE — Plan of Care (Signed)

## 2024-01-16 NOTE — Discharge Summary (Signed)
 Physician Discharge Summary  Yuette Putnam FMW:[id number] DOB: [birth year] DOA: 01/14/2024  PCP: [facility] [address] [facility]  Admit date: 01/14/2024 Discharge date: 01/16/2024  Time spent: 40 minutes  Recommendations for Outpatient Follow-up:  Please repeat iron  studies in 3 weeks consider ESA again or IV iron  Will need Hemoccult and colonoscopy if not done previously--please refer back to Dr. Lanny of hematology as they may need to work her up further see below note (see extensive notes and discharge summary 02/16/2017 Dr. Perri) Requires further workup and management of arrhythmia by cardiology [facility] cardiology  Discharge Diagnoses:  MAIN problem for hospitalization   Acute volume overload with hypoxic respiratory failure likely high-output heart failure?  Please see below for itemized issues addressed in HOpsital- refer to other progress notes for clarity if needed  Discharge Condition: Improved  Diet recommendation: Low-salt  Filed Weights   01/15/24 0600 01/16/24 0614 01/16/24 0757  Weight: 51 kg 52.4 kg 52.8 kg    History of present illness:  48 year old black female Pericardial effusion in 2020 secondary to uremia minoxidil -previous heart cath high output CHF question mark large fistula and possible severe per [facility] ESRD TTS with ICH 11/2019 + seizure secondary to hypertension noncompliance ED visit 12/13/2023 bedside echo did not show pericardial effusion-discharged home Recently seen OV 723/25 at cardiology-14-day Zio patch ordered to be returned on 8/6 and follow-up on 8/29 with cardiology  Present 47/31 SOB despite adherence to TTS HD weight 55.4 with dry weight of 52 at baseline-admits to noncompliance on low-salt diet Required BiPAP in ED and titrated down to 5 L oxygen uses oxygen periodically at home Found to have BNP 2100 BUN/creatinine 61/10 CXR acute pulmonary edema with chronic cardiomegaly Hemoglobin?  6.1 repeat was 7.6 but then dropped again during hospital stay  Nephrology  saw the patient in consult felt she was volume overloaded and felt that we may need to challenge her EDW and as such only Coreg  was continued and all of her other blood pressure meds were discontinued to challenge EDW She has an home O2 concentrator but does not wear it continuously and we will order this if needed  During hospital stay her hemoglobin dropped to 6.4-patient was given Venofer  X2 under guidance of nephrology for iron  level 24 saturation ratio 12   she was given 1 unit PRBC during dialysis and it was felt that she could go home to follow-up in the outpatient setting with close follow-up and extra dialysis session on 8/4  I am not sure if she is ever had a colonoscopy and this will need to be set up for her--there were no concerns of bleeding and I feel her high output heart failure in retrospect is secondary to her low hemoglobin as the baseline hemoglobin for her since 12/13/2023 has dropped from 9 to about 6 or 7 Chart review reveals?  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--she saw  thrombocytopenia that should be worked up in the outpatient --see 02/16/2017 discharge summary additionally  She is stable for discharge but does need to be followed by primary to coordinate some of these things inclusive of colonoscopy if never done   Discharge Exam: Vitals:   01/16/24 0830 01/16/24 0906  BP: 108/76 (P) 103/75  Pulse: 86 83  Resp: 10 18  Temp:  98.7 F (37.1 C)  SpO2: 98% 98%    Subj on day of d/c   Seen on  HD unit doing fair no distress no chest pain Has event monitor in place  General Exam on discharge  EOMI NCAT no focal deficit no icterus or pallor no wheeze rales rhonchi S1 metaglip ROM intact moving 4 limbs equally Right hand fistula stable Neck soft supple fake eyelashes noted mild pallor Event monitor in place No lower  extremity edema Abdomen soft  Discharge Instructions    Allergies as of 01/16/2024       Reactions   Glycerin-hypromellose-peg 400 Swelling   Visine [tetrahydrozoline Hcl] Swelling, Other (See Comments)   Eyes Swell        Medication List     STOP taking these medications    amLODipine  10 MG tablet Commonly known as: NORVASC    hydrALAZINE  100 MG tablet Commonly known as: APRESOLINE    losartan  25 MG tablet Commonly known as: COZAAR    spironolactone  50 MG tablet Commonly known as: ALDACTONE    Velphoro  500 MG chewable tablet Generic drug: sucroferric oxyhydroxide       TAKE these medications    acetaminophen  500 MG tablet Commonly known as: TYLENOL  Take 500-1,000 mg by mouth every 6 (six) hours as needed for mild pain or headache.   albuterol  108 (90 Base) MCG/ACT inhaler Commonly known as: VENTOLIN  HFA Inhale 1-2 puffs into the lungs every 6 (six) hours as needed for wheezing or shortness of breath.   benzonatate  100 MG capsule Commonly known as: TESSALON  Take 1 capsule (100 mg total) by mouth every 8 (eight) hours.   calcitRIOL  0.25 MCG capsule Commonly known as: ROCALTROL  Take 3 capsules (0.75 mcg total) by mouth every Monday, Wednesday, and Friday. Start taking on: January 18, 2024   carvedilol  25 MG tablet Commonly known as: COREG  Take 1 tablet (25 mg total) by mouth 2 (two) times daily with a meal. Take 25 mg by mouth at 6:30 AM and 6:00 PM on Sun/Mon/Wed/Fri and 25 mg at 11:00 AM and 8:00 PM on Tues/Thurs/Sat   cinacalcet  30 MG tablet Commonly known as: SENSIPAR  Take 30 mg by mouth daily.   Coricidin D Cold/Flu/Sinus 2-5-325 MG Tabs Generic drug: Chlorphen-Phenyleph-APAP Take 1-2 tablets by mouth every 6 (six) hours as needed (Cough).   DIALYVITE TABLET Tabs Take 1 tablet by mouth daily.   levETIRAcetam  1000 MG tablet Commonly known as: KEPPRA  Take 1,000 mg by mouth daily.   sevelamer  carbonate 800 MG tablet Commonly known as:  RENVELA  Take 800 mg by mouth See admin instructions. Take 800mg  (1 tab) with snacks.       Allergies  Allergen Reactions   Glycerin-Hypromellose-Peg 400 Swelling   Visine [Tetrahydrozoline Hcl] Swelling and Other (See Comments)    Eyes Swell    Follow-up Information     [facility], [facility]. Go on 01/18/2024.   Why: Extra in center dialysis appointment Monday in addition to usual TTS schedule. On monday, please arrive at clinic 07:30 am. Contact information: [address] [place] [address] [phone]                  The results of significant diagnostics from this hospitalization (including imaging, microbiology, ancillary and laboratory) are listed below for reference.    Significant Diagnostic Studies: DG Chest Portable 1 View Result Date: 01/14/2024 CLINICAL DATA:  48 year old female with shortness of breath and cough. Tachycardia. End stage renal disease. EXAM: PORTABLE CHEST 1 VIEW COMPARISON:  Chest radiograph 12/13/2023 and earlier. FINDINGS: Portable AP upright view at 0744 hours. Probably external round metallic device projecting over  the left upper chest now. Stable cardiomegaly and mediastinal contours. Small but increased bilateral pleural effusions. Increased bilateral pulmonary vascular congestion, symmetric interstitial opacity. No pneumothorax. No air bronchograms. Visualized tracheal air column is within normal limits. No acute osseous abnormality identified. Dextroconvex scoliosis. Negative visible bowel gas. IMPRESSION: Acute pulmonary edema with small pleural effusions, progressed since 12/13/2023. Chronic cardiomegaly. Electronically Signed   By: VEAR Hurst M.D.   On: 01/14/2024 07:57    Microbiology: No results found for this or any previous visit (from the past 240 hours).   Labs: Basic Metabolic Panel: Recent Labs  Lab 01/14/24 1010  NA 135  K 4.0  CL 95*  CO2 26  GLUCOSE 120*  BUN 61*  CREATININE 10.11*  CALCIUM   9.2   Liver Function Tests: No results for input(s): AST, ALT, ALKPHOS, BILITOT, PROT, ALBUMIN in the last 168 hours. No results for input(s): LIPASE, AMYLASE in the last 168 hours. No results for input(s): AMMONIA in the last 168 hours. CBC: Recent Labs  Lab 01/14/24 1010 01/14/24 1646 01/15/24 0308 01/16/24 0329  WBC 4.3 5.3 5.4 6.3  NEUTROABS  --   --   --  5.0  HGB 6.1* 7.6* 7.0* 6.4*  HCT 19.1* 23.2* 21.5* 19.7*  MCV 101.1* 98.7 100.5* 100.5*  PLT 104* 142* 126* 127*   Cardiac Enzymes: No results for input(s): CKTOTAL, CKMB, CKMBINDEX, TROPONINI in the last 168 hours. BNP: BNP (last 3 results) Recent Labs    08/30/23 1231 12/13/23 2137 01/14/24 0651  BNP 250.3* 201.2* 2,111.4*    ProBNP (last 3 results) No results for input(s): PROBNP in the last 8760 hours.  CBG: No results for input(s): GLUCAP in the last 168 hours.  Signed:  Colen Grimes MD   [facility] 01/16/2024, 9:08 AM

## 2024-01-16 NOTE — Plan of Care (Signed)

## 2024-01-16 NOTE — Progress Notes (Signed)
 Patient verbalized understanding of dc instructions. She also requested a work note , primary Rn and MD made aware.

## 2024-01-16 NOTE — Progress Notes (Signed)
 Received patient in bed to unit.  Alert and oriented.  Informed consent signed and in chart.   TX duration: 3 hours and 15 minutes  Patient tolerated well.  Transported back to the room  Alert, without acute distress.  Hand-off given to patient's nurse.   Access used: Right Graft Upper Arm Access issues: none  Total UF removed: 2.4L Medication(s) given: 1 Unit blood, 400cc NS bolus for BP support   01/16/24 1139  Vitals  Temp 99 F (37.2 C)  Temp Source Oral  BP (!) 88/66  MAP (mmHg) 75  BP Location Left Wrist  BP Method Automatic  Patient Position (if appropriate) Lying  Pulse Rate 85  Pulse Rate Source Monitor  ECG Heart Rate 85  Resp (!) 21  Oxygen Therapy  SpO2 92 %  O2 Device Nasal Cannula  O2 Flow Rate (L/min) 2 L/min  During Treatment Monitoring  Duration of HD Treatment -hour(s) 3.5 hour(s)  HD Safety Checks Performed Yes  Intra-Hemodialysis Comments Tx completed  Dialysis Fluid Bolus Normal Saline  Bolus Amount (mL) 200 mL  Post Treatment  Dialyzer Clearance Clear  Liters Processed 82  Fluid Removed (mL) 2.4 mL  Tolerated HD Treatment No (Comment)  Post-Hemodialysis Comments Low BP and cramping prevented patient from hitting prescribed goal  AVG/AVF Arterial Site Held (minutes) 7 minutes  AVG/AVF Venous Site Held (minutes) 7 minutes  Fistula / Graft Right Upper arm Arteriovenous vein graft  Placement Date/Time: 10/20/22 9166   Placed prior to admission: No  Orientation: Right  Access Location: Upper arm  Access Type: Arteriovenous vein graft  Status Deaccessed     Camellia Brasil LPN [facility]

## 2024-01-16 NOTE — Progress Notes (Signed)
 Nephrology Follow-Up Consult note   Outpatient dialysis unit: [facility] Outpatient dialysis prescription: TTS, F160, BFR 350, EDW 52, 3hr 45 min, 2K, 2ca, RUE AVG, mircera 75mcg given 7/29, calcitriol  0.75mcg with treatments, sevelamer  3 with meals   Assessment/Recommendations: Madeline Brown is a/an 48 y.o. female with a past medical history notable for ESRD on HD admitted with shortness of breath.    # ESRD: Urgent dialysis on 7/31 for volume removal.  Dialysis today per schedule.  I arranged extra dialysis on Monday at 7:40 AM at her outpatient dialysis unit.  Then would continue with her regular schedule..   # Volume/ hypertension: Likely with some weight loss.  Challenging dry weight with aggressive ultrafiltration as tolerated.  Continue home blood pressure medications.  Change EDW at discharge   # Anemia of Chronic Kidney Disease: Hemoglobin was precipitous drop from admission.  Unclear why.  Likely the initial hemoglobin was not accurate.  ESA not due currently.  IV iron  given.  Transfusion today.  No signs of blood loss   # Secondary Hyperparathyroidism/Hyperphosphatemia: Continue home calcitriol  on dialysis days   # Vascular access: No issues   # Acute hypoxic respiratory failure: Associate with pulmonary edema.  Continue with dialysis as above   # Additional recommendations: - Dose all meds for creatinine clearance < 10 ml/min  - Unless absolutely necessary, no MRIs with gadolinium.  - Implement save arm precautions.  Prefer needle sticks in the dorsum of the hands or wrists.  No blood pressure measurements in arm. - If blood transfusion is requested during hemodialysis sessions, please alert us  prior to the session.  - Use synthetic opioids (Fentanyl /Dilaudid ) if needed   Recommendations were discussed with the primary team.   Recommendations conveyed to primary service.    Jayson JINNY Player [facility] 01/16/2024 8:45  AM  ___________________________________________________________  CC: Shortness of breath  Interval History/Subjective: Patient feels about the same today.  Mild shortness of breath similar to yesterday.  Medications:  Current Facility-Administered Medications  Medication Dose Route Frequency Provider Last Rate Last Admin   0.9 %  sodium chloride  infusion (Manually program via Guardrails IV Fluids)   Intravenous Once Laurence Locus, DO       0.9 %  sodium chloride  infusion (Manually program via Guardrails IV Fluids)   Intravenous Once Drae Mitzel J, MD       albuterol  (PROVENTIL ) (2.5 MG/3ML) 0.083% nebulizer solution 2.5 mg  2.5 mg Inhalation Q6H PRN Georgina Basket, MD       calcitRIOL  (ROCALTROL ) capsule 0.75 mcg  0.75 mcg Oral Q M,W,F Nery Frappier J, MD   0.75 mcg at 01/15/24 9161   carvedilol  (COREG ) tablet 25 mg  25 mg Oral BID WC Laurence Locus, DO   25 mg at 01/15/24 1724   Chlorhexidine  Gluconate Cloth 2 % PADS 6 each  6 each Topical Q0600 Lenon Charmaine BRAVO, NP   6 each at 01/16/24 9380   cinacalcet  (SENSIPAR ) tablet 30 mg  30 mg Oral Q breakfast Georgina Basket, MD   30 mg at 01/16/24 0730   heparin  injection 5,000 Units  5,000 Units Subcutaneous Q8H Moore, Willie, MD   5,000 Units at 01/16/24 9380   iron  sucrose (VENOFER ) 500 mg in sodium chloride  0.9 % 250 mL IVPB  500 mg Intravenous Daily Player Jayson JINNY, MD 79 mL/hr at 01/15/24 1506 500 mg at 01/15/24 1506   levETIRAcetam  (KEPPRA ) tablet 1,000 mg  1,000 mg Oral Daily Georgina Basket, MD   1,000 mg at 01/15/24  9160   sevelamer  carbonate (RENVELA ) tablet 2,400 mg  2,400 mg Oral TID WC Macel Jayson PARAS, MD   2,400 mg at 01/16/24 0730      Review of Systems: 10 systems reviewed and negative except per interval history/subjective  Physical Exam: Vitals:   01/16/24 0822 01/16/24 0830  BP: 108/76 108/76  Pulse: 83 86  Resp: 20 10  Temp: 97.8 F (36.6 C)   SpO2: 97% 98%   Total I/O In: 315 [Blood:315] Out: -    Intake/Output Summary (Last 24 hours) at 01/16/2024 0845 Last data filed at 01/16/2024 0807 Gross per 24 hour  Intake 315 ml  Output --  Net 315 ml   Constitutional: well-appearing, no acute distress ENMT: ears and nose without scars or lesions, MMM CV: normal rate, trace edema Respiratory: Bilateral chest rise with mild increased work of breathing Gastrointestinal: soft, non-tender, no palpable masses or hernias Skin: no visible lesions or rashes Psych: alert, judgement/insight appropriate, appropriate mood and affect   Test Results I personally reviewed new and old clinical labs and radiology tests Lab Results  Component Value Date   NA 135 01/14/2024   K 4.0 01/14/2024   CL 95 (L) 01/14/2024   CO2 26 01/14/2024   BUN 61 (H) 01/14/2024   CREATININE 10.11 (H) 01/14/2024   CALCIUM  9.2 01/14/2024   ALBUMIN 3.2 (L) 08/31/2023   PHOS 2.4 (L) 08/31/2023    CBC Recent Labs  Lab 01/14/24 1646 01/15/24 0308 01/16/24 0329  WBC 5.3 5.4 6.3  NEUTROABS  --   --  5.0  HGB 7.6* 7.0* 6.4*  HCT 23.2* 21.5* 19.7*  MCV 98.7 100.5* 100.5*  PLT 142* 126* 127*

## 2024-01-17 ENCOUNTER — Telehealth (HOSPITAL_COMMUNITY): Payer: Self-pay | Admitting: Nephrology

## 2024-01-17 LAB — BPAM RBC
Blood Product Expiration Date: 202508302359
ISSUE DATE / TIME: 202508020732
Unit Type and Rh: 7300

## 2024-01-17 LAB — TYPE AND SCREEN
ABO/RH(D): B POS
Antibody Screen: NEGATIVE
Unit division: 0

## 2024-01-17 NOTE — Telephone Encounter (Signed)
 Transition of Care - Initial Contact after Hospitalization  Date of discharge: 01/16/24 Date of contact: 01/17/24  Method: Phone Spoke to: Patient  Patient contacted to discuss transition of care from recent inpatient hospitalization. Patient was admitted to [facility] from 7/31-01/16/24 with AHRF/volume overload, hypotension, anemia.  The discharge medication list was reviewed. Off all BP meds except Coreg . She is nervous about this. To check BP at home. If SBP >140 can use 1/2 tab hydralazine  BID. Also - to ignore the calcitriol  on d/c summary - gets with HD.   Patient will return to his/her outpatient HD unit on Monday 8/4 for extra HD, then usual TTS schedule - she will be seen there on Thursday by her usual nephrologist.  No other concerns at this time.  Izetta Boehringer, PA-C [employer] Pager [phone]

## 2024-01-17 NOTE — Discharge Planning (Signed)
 [facility] Patient Discharge Orders - [facility] CLINIC: [place]  Patient's name: Madeline Brown Admit/DC Dates: 01/14/2024 - 01/16/2024  DISCHARGE DIAGNOSES: Acute Hypoxic Resp Failure/volume overload -> better with HD  Symptomatic anemia -> unclear reason, no signs of GIB -> tranfused   HD ORDER CHANGES: Heparin  change: no EDW Change: YES New EDW: 48kg Bath Change: no   ANEMIA MANAGEMENT: Aranesp : Given: no  ESA dose for discharge: mircera 150 mcg IV q 2 weeks, to start on 01/26/24 IV Iron  dose at discharge: per protocol Transfusion: Given: YES - 1U PRBCs on 01/16/24  BONE/MINERAL MEDICATIONS: Hectorol /Calcitriol  change: no Sensipar /Parsabiv change: no  ACCESS INTERVENTION/CHANGE: no Details:  RECENT LABS: Recent Labs  Lab 01/16/24 0329 01/16/24 1255  HGB 6.4*  --   NA  --  136  K  --  3.4*  CALCIUM   --  9.3  PHOS  --  2.4*  ALBUMIN  --  2.9*    IV ANTIBIOTICS: no Details:  OTHER ANTICOAGULATION: no   OTHER/APPTS/LAB ORDERS: - Extra HD arranged for 8/4 at 7:40am - pls call on-call provider if her goal will be less than 2L with new EDW - Pls check K, Alb, and Phos this week - low here, ?odd  D/C Meds to be reconciled by nurse after every discharge.  Completed By: Izetta Boehringer, PA-C [facility] Pager [phone]   Reviewed by: MD:______ RN_______

## 2024-01-18 NOTE — Progress Notes (Signed)
 Late Note Entry- January 18, 2024  Pt was d/c on Saturday. Contacted [facility] this morning to be advised of pt's d/c date and that pt should have resumed care this morning.   Randine Mungo Dialysis Navigator [phone]

## 2024-01-28 ENCOUNTER — Emergency Department (HOSPITAL_COMMUNITY)

## 2024-01-28 ENCOUNTER — Inpatient Hospital Stay (HOSPITAL_COMMUNITY)
Admission: EM | Admit: 2024-01-28 | Discharge: 2024-02-02 | DRG: 189 | Disposition: A | Discharge status: Home / Self Care | Attending: Internal Medicine | Admitting: Internal Medicine

## 2024-01-28 ENCOUNTER — Other Ambulatory Visit: Payer: Self-pay

## 2024-01-28 ENCOUNTER — Encounter (HOSPITAL_COMMUNITY): Payer: Self-pay | Admitting: Internal Medicine

## 2024-01-28 DIAGNOSIS — D631 Anemia in chronic kidney disease: Secondary | ICD-10-CM | POA: Diagnosis present

## 2024-01-28 DIAGNOSIS — R0602 Shortness of breath: Principal | ICD-10-CM

## 2024-01-28 DIAGNOSIS — I272 Pulmonary hypertension, unspecified: Secondary | ICD-10-CM | POA: Diagnosis present

## 2024-01-28 DIAGNOSIS — N179 Acute kidney failure, unspecified: Secondary | ICD-10-CM | POA: Diagnosis present

## 2024-01-28 DIAGNOSIS — E8779 Other fluid overload: Secondary | ICD-10-CM | POA: Diagnosis not present

## 2024-01-28 DIAGNOSIS — I5083 High output heart failure: Secondary | ICD-10-CM | POA: Diagnosis present

## 2024-01-28 DIAGNOSIS — D539 Nutritional anemia, unspecified: Secondary | ICD-10-CM | POA: Diagnosis present

## 2024-01-28 DIAGNOSIS — Z1152 Encounter for screening for COVID-19: Secondary | ICD-10-CM

## 2024-01-28 DIAGNOSIS — Z992 Dependence on renal dialysis: Secondary | ICD-10-CM

## 2024-01-28 DIAGNOSIS — N186 End stage renal disease: Secondary | ICD-10-CM | POA: Diagnosis not present

## 2024-01-28 DIAGNOSIS — N2581 Secondary hyperparathyroidism of renal origin: Secondary | ICD-10-CM | POA: Diagnosis present

## 2024-01-28 DIAGNOSIS — I132 Hypertensive heart and chronic kidney disease with heart failure and with stage 5 chronic kidney disease, or end stage renal disease: Secondary | ICD-10-CM | POA: Diagnosis present

## 2024-01-28 DIAGNOSIS — F17210 Nicotine dependence, cigarettes, uncomplicated: Secondary | ICD-10-CM | POA: Diagnosis present

## 2024-01-28 DIAGNOSIS — J810 Acute pulmonary edema: Principal | ICD-10-CM | POA: Diagnosis present

## 2024-01-28 DIAGNOSIS — Z888 Allergy status to other drugs, medicaments and biological substances status: Secondary | ICD-10-CM

## 2024-01-28 DIAGNOSIS — J9601 Acute respiratory failure with hypoxia: Secondary | ICD-10-CM | POA: Diagnosis present

## 2024-01-28 DIAGNOSIS — Z833 Family history of diabetes mellitus: Secondary | ICD-10-CM

## 2024-01-28 DIAGNOSIS — I10 Essential (primary) hypertension: Secondary | ICD-10-CM | POA: Diagnosis not present

## 2024-01-28 DIAGNOSIS — D638 Anemia in other chronic diseases classified elsewhere: Secondary | ICD-10-CM

## 2024-01-28 DIAGNOSIS — Z8673 Personal history of transient ischemic attack (TIA), and cerebral infarction without residual deficits: Secondary | ICD-10-CM

## 2024-01-28 DIAGNOSIS — I953 Hypotension of hemodialysis: Secondary | ICD-10-CM | POA: Diagnosis present

## 2024-01-28 DIAGNOSIS — D696 Thrombocytopenia, unspecified: Secondary | ICD-10-CM | POA: Diagnosis present

## 2024-01-28 DIAGNOSIS — Z79899 Other long term (current) drug therapy: Secondary | ICD-10-CM

## 2024-01-28 DIAGNOSIS — Z8249 Family history of ischemic heart disease and other diseases of the circulatory system: Secondary | ICD-10-CM

## 2024-01-28 DIAGNOSIS — Z9981 Dependence on supplemental oxygen: Secondary | ICD-10-CM

## 2024-01-28 DIAGNOSIS — G40909 Epilepsy, unspecified, not intractable, without status epilepticus: Secondary | ICD-10-CM | POA: Diagnosis present

## 2024-01-28 DIAGNOSIS — Z809 Family history of malignant neoplasm, unspecified: Secondary | ICD-10-CM

## 2024-01-28 DIAGNOSIS — Z820 Family history of epilepsy and other diseases of the nervous system: Secondary | ICD-10-CM

## 2024-01-28 DIAGNOSIS — J9621 Acute and chronic respiratory failure with hypoxia: Secondary | ICD-10-CM | POA: Diagnosis present

## 2024-01-28 LAB — CBC WITH DIFFERENTIAL/PLATELET
Basophils Absolute: 0 K/uL (ref 0.0–0.1)
Basophils Relative: 0 %
Eosinophils Absolute: 0.1 K/uL (ref 0.0–0.5)
Eosinophils Relative: 3 %
HCT: 22.5 % — ABNORMAL LOW (ref 36.0–46.0)
Hemoglobin: 7.2 g/dL — ABNORMAL LOW (ref 12.0–15.0)
Lymphocytes Relative: 13 %
Lymphs Abs: 0.6 K/uL — ABNORMAL LOW (ref 0.7–4.0)
MCH: 33 pg (ref 26.0–34.0)
MCHC: 32 g/dL (ref 30.0–36.0)
MCV: 103.2 fL — ABNORMAL HIGH (ref 80.0–100.0)
Monocytes Absolute: 0.1 K/uL (ref 0.1–1.0)
Monocytes Relative: 3 %
Neutro Abs: 4 K/uL (ref 1.7–7.7)
Neutrophils Relative %: 81 %
Platelets: 87 K/uL — ABNORMAL LOW (ref 150–400)
RBC: 2.18 MIL/uL — ABNORMAL LOW (ref 3.87–5.11)
RDW: 16 % — ABNORMAL HIGH (ref 11.5–15.5)
WBC: 4.9 K/uL (ref 4.0–10.5)
nRBC: 0 % (ref 0.0–0.2)

## 2024-01-28 LAB — CBC
HCT: 24.1 % — ABNORMAL LOW (ref 36.0–46.0)
Hemoglobin: 7.6 g/dL — ABNORMAL LOW (ref 12.0–15.0)
MCH: 32.9 pg (ref 26.0–34.0)
MCHC: 31.5 g/dL (ref 30.0–36.0)
MCV: 104.3 fL — ABNORMAL HIGH (ref 80.0–100.0)
Platelets: 92 K/uL — ABNORMAL LOW (ref 150–400)
RBC: 2.31 MIL/uL — ABNORMAL LOW (ref 3.87–5.11)
RDW: 16 % — ABNORMAL HIGH (ref 11.5–15.5)
WBC: 4.6 K/uL (ref 4.0–10.5)
nRBC: 0 % (ref 0.0–0.2)

## 2024-01-28 LAB — RESP PANEL BY RT-PCR (RSV, FLU A&B, COVID)  RVPGX2
Influenza A by PCR: NEGATIVE
Influenza B by PCR: NEGATIVE
Resp Syncytial Virus by PCR: NEGATIVE
SARS Coronavirus 2 by RT PCR: NEGATIVE

## 2024-01-28 LAB — TROPONIN I (HIGH SENSITIVITY)
Troponin I (High Sensitivity): 36 ng/L — ABNORMAL HIGH (ref <18)
Troponin I (High Sensitivity): 36 ng/L — ABNORMAL HIGH (ref <18)

## 2024-01-28 LAB — MRSA NEXT GEN BY PCR, NASAL: MRSA by PCR Next Gen: NOT DETECTED

## 2024-01-28 LAB — BASIC METABOLIC PANEL WITH GFR
Anion gap: 13 (ref 5–15)
BUN: 64 mg/dL — ABNORMAL HIGH (ref 6–20)
CO2: 27 mmol/L (ref 22–32)
Calcium: 9.1 mg/dL (ref 8.9–10.3)
Chloride: 94 mmol/L — ABNORMAL LOW (ref 98–111)
Creatinine, Ser: 8.52 mg/dL — ABNORMAL HIGH (ref 0.44–1.00)
GFR, Estimated: 5 mL/min — ABNORMAL LOW (ref >60)
Glucose, Bld: 90 mg/dL (ref 70–99)
Potassium: 4.7 mmol/L (ref 3.5–5.1)
Sodium: 134 mmol/L — ABNORMAL LOW (ref 135–145)

## 2024-01-28 LAB — BRAIN NATRIURETIC PEPTIDE: B Natriuretic Peptide: 2270.9 pg/mL — ABNORMAL HIGH (ref 0.0–100.0)

## 2024-01-28 LAB — CREATININE, SERUM
Creatinine, Ser: 9.13 mg/dL — ABNORMAL HIGH (ref 0.44–1.00)
GFR, Estimated: 5 mL/min — ABNORMAL LOW (ref >60)

## 2024-01-28 MED ORDER — SODIUM CHLORIDE 0.9 % IV SOLN: 250.0000 mL | INTRAVENOUS | Status: AC | PRN

## 2024-01-28 MED ORDER — MIDODRINE HCL 5 MG PO TABS
ORAL_TABLET | ORAL | Status: AC
  Filled 2024-01-28: qty 5

## 2024-01-28 MED ORDER — HEPARIN SODIUM (PORCINE) 1000 UNIT/ML DIALYSIS: 1000.0000 [IU] | INTRAMUSCULAR | Status: DC | PRN

## 2024-01-28 MED ORDER — CHLORHEXIDINE GLUCONATE CLOTH 2 % EX PADS
6.0000 | MEDICATED_PAD | Freq: Every day | CUTANEOUS | Status: DC
  Administered 2024-01-29: 6 via TOPICAL

## 2024-01-28 MED ORDER — MIDODRINE HCL 5 MG PO TABS
10.0000 mg | ORAL_TABLET | Freq: Three times a day (TID) | ORAL | Status: DC
  Administered 2024-01-28 – 2024-02-02 (×16): 10 mg via ORAL
  Filled 2024-01-28 (×15): qty 2

## 2024-01-28 MED ORDER — ALBUTEROL SULFATE (2.5 MG/3ML) 0.083% IN NEBU: 2.5000 mg | INHALATION_SOLUTION | RESPIRATORY_TRACT | Status: DC | PRN

## 2024-01-28 MED ORDER — SENNOSIDES-DOCUSATE SODIUM 8.6-50 MG PO TABS
1.0000 | ORAL_TABLET | Freq: Every evening | ORAL | Status: DC | PRN
Start: 2024-01-28 — End: 2024-02-02

## 2024-01-28 MED ORDER — HEPARIN SODIUM (PORCINE) 5000 UNIT/ML IJ SOLN
5000.0000 [IU] | Freq: Three times a day (TID) | INTRAMUSCULAR | Status: DC
  Administered 2024-01-28 – 2024-02-02 (×14): 5000 [IU] via SUBCUTANEOUS
  Filled 2024-01-28 (×14): qty 1

## 2024-01-28 MED ORDER — CARVEDILOL 12.5 MG PO TABS
12.5000 mg | ORAL_TABLET | Freq: Two times a day (BID) | ORAL | Status: DC
  Administered 2024-01-28: 12.5 mg via ORAL
  Filled 2024-01-28: qty 1

## 2024-01-28 MED ORDER — LEVETIRACETAM 500 MG PO TABS
1000.0000 mg | ORAL_TABLET | Freq: Every day | ORAL | Status: DC
  Administered 2024-01-28 – 2024-02-02 (×6): 1000 mg via ORAL
  Filled 2024-01-28 (×5): qty 2

## 2024-01-28 MED ORDER — PENTAFLUOROPROP-TETRAFLUOROETH EX AERO
1.0000 | INHALATION_SPRAY | CUTANEOUS | Status: DC | PRN
Start: 2024-01-28 — End: 2024-01-28

## 2024-01-28 MED ORDER — ACETAMINOPHEN 325 MG PO TABS
650.0000 mg | ORAL_TABLET | Freq: Four times a day (QID) | ORAL | Status: DC | PRN
  Administered 2024-01-30 – 2024-02-01 (×4): 650 mg via ORAL
  Filled 2024-01-28 (×4): qty 2

## 2024-01-28 MED ORDER — NEPRO/CARBSTEADY PO LIQD
237.0000 mL | Freq: Two times a day (BID) | ORAL | Status: DC
  Administered 2024-01-31 – 2024-02-01 (×3): 237 mL via ORAL

## 2024-01-28 MED ORDER — CINACALCET HCL 30 MG PO TABS
30.0000 mg | ORAL_TABLET | Freq: Every day | ORAL | Status: DC
  Administered 2024-01-28 – 2024-02-02 (×6): 30 mg via ORAL
  Filled 2024-01-28 (×5): qty 1

## 2024-01-28 MED ORDER — CALCITRIOL 0.5 MCG PO CAPS
0.7500 ug | ORAL_CAPSULE | ORAL | Status: DC
  Administered 2024-01-29 – 2024-02-01 (×2): 0.75 ug via ORAL
  Filled 2024-01-28 (×2): qty 1

## 2024-01-28 MED ORDER — LIDOCAINE HCL (PF) 1 % IJ SOLN: 5.0000 mL | INTRAMUSCULAR | Status: DC | PRN

## 2024-01-28 MED ORDER — ONDANSETRON HCL 4 MG/2ML IJ SOLN
4.0000 mg | Freq: Four times a day (QID) | INTRAMUSCULAR | Status: DC | PRN
  Administered 2024-01-30 – 2024-02-01 (×5): 4 mg via INTRAVENOUS
  Filled 2024-01-28 (×5): qty 2

## 2024-01-28 MED ORDER — ONDANSETRON HCL 4 MG PO TABS: 4.0000 mg | ORAL_TABLET | Freq: Four times a day (QID) | ORAL | Status: DC | PRN

## 2024-01-28 MED ORDER — SODIUM CHLORIDE 0.9% FLUSH: 3.0000 mL | INTRAVENOUS | Status: DC | PRN

## 2024-01-28 MED ORDER — ANTICOAGULANT SODIUM CITRATE 4% (200MG/5ML) IV SOLN: 5.0000 mL | Status: DC | PRN

## 2024-01-28 MED ORDER — ALTEPLASE 2 MG IJ SOLR: 2.0000 mg | Freq: Once | INTRAMUSCULAR | Status: DC | PRN

## 2024-01-28 MED ORDER — ORAL CARE MOUTH RINSE: 15.0000 mL | OROMUCOSAL | Status: DC | PRN

## 2024-01-28 MED ORDER — ACETAMINOPHEN 650 MG RE SUPP: 650.0000 mg | Freq: Four times a day (QID) | RECTAL | Status: DC | PRN

## 2024-01-28 MED ORDER — SODIUM CHLORIDE 0.9% FLUSH
3.0000 mL | Freq: Two times a day (BID) | INTRAVENOUS | Status: DC
  Administered 2024-01-28 – 2024-02-02 (×9): 3 mL via INTRAVENOUS

## 2024-01-28 NOTE — Plan of Care (Signed)

## 2024-01-28 NOTE — Consult Note (Signed)
 Madeline Brown, Madeline Brown, Madeline Brown, Madeline secondary hyperparathyroidism. PCP:  HPI: Ardice Dykema is a 48 y.o. female patient with PMH of ESRD on Brown, Madeline Brown, Madeline Brown, Madeline Brown, Madeline Brown ED because she woke up around midnight last night with sudden onset of shortness of breath Madeline chest discomfort. Patient had a similar episode in early August, where she was admitted with volume overload/Madeline edema. Patient has been losing weight recently, Madeline had her EDW reduced at OP Brown. She feels that her EDW was not adjusted enough, Madeline they were not removing enough fluid during Brown. She reports that she had chest discomfort around her sternal area. She denies any nausea or vomiting associated with Brown chest discomfort. She was brought to Brown ED via EMS. Her EKG Madeline troponin's were negative but her chest xray confirmed that she had Madeline edema.  Patient is complaint with her Brown in Brown OP setting. As above, she reports that she has been losing weight recently. Today she reports that her breathing is just okay, she is on 4L [place]. She denied any chest pain when I saw her. Plan for 4L UF in Brown today. BP has been on Brown lower side Madeline she was recently started on midodrine  for hypotension.   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, or unspecified Madeline kidney disease 06/30/2017   Intracerebral hemorrhage 02/21/2019   Intracranial hematoma (HCC) 02/20/2019   Intracranial hemorrhage (HCC)    LVH (left ventricular hypertrophy)    Pericardial effusion    Pneumonia    Madeline hypertension (HCC)     Madeline (HCC) 02/2019   last one was in 02/2019   Shortness of breath 07/15/2018   on 3L via [place] prn   Stroke (HCC)    Mild was told after having a Madeline   Substance abuse (HCC)    Marijuana   Thrombocytopenia (HCC)    Past Surgical History:  Procedure Laterality Date   AV FISTULA PLACEMENT Left 06/24/2017   Procedure: CREATION OF LEFT ARM BRACHIALCEPHALIC  ARTERIOVENOUS (AV) FISTULA;  Surgeon: Oris Krystal FALCON, MD;  Location: [facility] OR;  Service: Vascular;  Laterality: Left;   AV FISTULA PLACEMENT Right 10/20/2022   Procedure: INSERTION OF RIGHT ARM ARTERIOVENOUS (AV) GORE-TEX GRAFT;  Surgeon: Magda Debby SAILOR, MD;  Location: [facility] OR;  Service: Vascular;  Laterality: Right;   CARDIAC CATHETERIZATION     CESAREAN SECTION     x2   COMPLEX WOUND CLOSURE Left 07/30/2020   Procedure: COMPLEX WOUND CLOSURE;  Surgeon: Magda Debby SAILOR, MD;  Location: [facility] OR;  Service: Vascular;  Laterality: Left;   FISTULA SUPERFICIALIZATION Left 10/28/2017   Procedure: SUPERFICIALIZATION LEFT BRACHIOCEPHALIC ARTERIOVENOUS FISTULA;  Surgeon: Oris Krystal FALCON, MD;  Location: [facility] OR;  Service: Vascular;  Laterality: Left;   INGUINAL HERNIA REPAIR Bilateral 02/01/2021   Procedure: LAPAROSCOPIC BILATERAL INGUINAL HERNIA REPAIR WITH MESH;  Surgeon: Kinsinger, Herlene Righter, MD;  Location: [facility] ORS;  Service: General;  Laterality: Bilateral;   INSERTION OF DIALYSIS CATHETER Right 06/24/2017   Procedure: INSERTION OF TUNNELED DIALYSIS CATHETER;  Surgeon: Oris Krystal FALCON, MD;  Location: [facility];  Service: Vascular;  Laterality: Right;   INSERTION OF DIALYSIS CATHETER Right 07/30/2020   Procedure: INSERTION OF DIALYSIS CATHETER;  Surgeon: Magda Debby SAILOR, MD;  Location: [facility];  Service: Vascular;  Laterality: Right;   INSERTION OF DIALYSIS CATHETER Right 08/06/2022   Procedure: INSERTION OF TUNNELED DIALYSIS CATHETER;  Surgeon: Magda Debby SAILOR, MD;  Location: [facility];  Service: Vascular;  Laterality: Right;   LIGATION OF COMPETING  BRANCHES OF ARTERIOVENOUS FISTULA  10/28/2017   Procedure: LIGATION OF COMPETING BRANCHES OF ARTERIOVENOUS FISTULA x3;  Surgeon: Oris Krystal FALCON, MD;  Location: [facility];  Service: Vascular;;   REVISON OF ARTERIOVENOUS FISTULA Left 07/30/2020   Procedure: LEFT UPPER EXTREMITY ARTERIOVENOUS FISTULA REVISON;  Surgeon: Magda Debby SAILOR, MD;  Location: [facility];  Service: Vascular;  Laterality: Left;  PERIPHERAL NERVE BLOCK   REVISON OF ARTERIOVENOUS FISTULA Left 08/06/2022   Procedure: LEFT UPPER EXTREMITY FISTULA LIGATION;  Surgeon: Magda Debby SAILOR, MD;  Location: [facility];  Service: Vascular;  Laterality: Left;   RIGHT HEART CATH N/A 07/09/2022   Procedure: RIGHT HEART CATH;  Surgeon: Gardenia Led, DO;  Location: [facility] INVASIVE CV LAB;  Service: Cardiovascular;  Laterality: N/A;   Family History  Problem Relation Age of Onset   Diabetes Father    Hypertension Father    Cancer Father    Heart disease Father    Aneurysm Mother    Seizures Mother    Social History:  reports that she has quit smoking. Her smoking use included cigarettes. She started smoking about 29 years ago. She has a 7.4 pack-year smoking history. She has been exposed to tobacco smoke. She has never used smokeless tobacco. She reports that she does not currently use alcohol. She reports current drug use. Drug: Marijuana.  Physical Exam: Vitals:   01/28/24 1103 01/28/24 1130 01/28/24 1145 01/28/24 1200  BP: 115/88 115/80 98/75 101/73  Pulse:    88  Resp:    (!) 23  Temp:      TempSrc:      SpO2:    97%  Weight:      Height:         Gen Exam:Alert awake-not in any distress HEENT:atraumatic, normocephalic Chest: Fine bibasilar rales. CVS:S1S2 regular Abdomen:soft non tender, non distended Extremities:no edema Neurology: Non focal Skin: no rash. Dialysis Access:  Allergies  Allergen Reactions   Glycerin-Hypromellose-Peg 400 Swelling   Visine [Tetrahydrozoline Hcl] Swelling Madeline Other (See Comments)    Eyes Swell    Prior to Admission medications   Medication Sig Start Date End Date Taking? Authorizing Provider  acetaminophen  (TYLENOL ) 500 MG tablet Take 500-1,000 mg by mouth every 6 (six) hours as needed for mild pain or headache.   Yes [provider]  albuterol  (VENTOLIN  HFA) 108 (90 Base) MCG/ACT inhaler Inhale 1-2 puffs into Brown lungs every 6 (six) hours as needed for wheezing or shortness of breath. 12/14/23  Yes Fleming, Conner M, PA-C  B Complex-C-Folic Acid  (DIALYVITE TABLET) TABS Take 1 tablet by mouth daily.   Yes [provider]  calcitRIOL  (ROCALTROL ) 0.25 MCG capsule Take 3 capsules (0.75 mcg total) by mouth every Monday, Wednesday, Madeline Friday. Patient taking differently: Take 0.5-0.75 mcg by mouth See admin instructions. Take 2 to 3 capsules by mouth on Tue, Thurs, Sat morning 01/18/24  Yes Samtani, Jai-Gurmukh, MD  carvedilol  (COREG ) 25 MG tablet Take 1 tablet (25 mg total) by mouth 2 (two) times daily with a meal. Take 25 mg by  mouth at 6:30 AM Madeline 6:00 PM on Sun/Mon/Wed/Fri Madeline 25 mg at 11:00 AM Madeline 8:00 PM on Tues/Thurs/Sat 08/06/22 01/27/25 Yes Rhyne, Samantha J, PA-C  cinacalcet  (SENSIPAR ) 30 MG tablet Take 30 mg by mouth daily.   Yes [provider]  levETIRAcetam  (KEPPRA ) 1000 MG tablet Take 1,000 mg by mouth daily.   Yes [provider]  midodrine  (PROAMATINE ) 5 MG tablet Take 1 tablet by mouth See admin instructions. Take 1 tablet by mouth on dialysis days 01/21/24  Yes [provider]  sucroferric oxyhydroxide (VELPHORO ) 500 MG chewable tablet Chew 500 mg by mouth 3 (three) times daily with meals.   Yes [provider]  sevelamer  carbonate (RENVELA ) 800 MG tablet Take 800 mg by mouth See admin instructions. Take 800mg  (1 tab) with snacks. Patient not taking: Reported on 01/28/2024 02/10/23   [provider]   Current Facility-Administered Medications  Medication Dose Route Frequency Provider Last Rate Last Admin   0.9 %  sodium  chloride infusion  250 mL Intravenous PRN Ghimire, Donalda HERO, MD       acetaminophen  (TYLENOL ) tablet 650 mg  650 mg Oral Q6H PRN Ghimire, Donalda HERO, MD       Or   acetaminophen  (TYLENOL ) suppository 650 mg  650 mg Rectal Q6H PRN Ghimire, Donalda HERO, MD       albuterol  (PROVENTIL ) (2.5 MG/3ML) 0.083% nebulizer solution 2.5 mg  2.5 mg Nebulization Q2H PRN Ghimire, Donalda HERO, MD       alteplase  (CATHFLO ACTIVASE ) injection 2 mg  2 mg Intracatheter Once PRN Fritz Belvie DEL, NP       anticoagulant sodium citrate  solution 5 mL  5 mL Intracatheter PRN Fritz Belvie DEL, NP       [START ON 01/29/2024] calcitRIOL  (ROCALTROL ) capsule 0.75 mcg  0.75 mcg Oral Q M,W,F Ghimire, Donalda HERO, MD       carvedilol  (COREG ) tablet 12.5 mg  12.5 mg Oral BID WC Ghimire, Donalda HERO, MD       Chlorhexidine  Gluconate Cloth 2 % PADS 6 each  6 each Topical Q0600 Fritz Belvie DEL, NP       cinacalcet  (SENSIPAR ) tablet 30 mg  30 mg Oral Daily Ghimire, Donalda HERO, MD       heparin  injection 1,000 Units  1,000 Units Intracatheter PRN Fritz Belvie DEL, NP       heparin  injection 5,000 Units  5,000 Units Subcutaneous Q8H Ghimire, Donalda HERO, MD       levETIRAcetam  (KEPPRA ) tablet 1,000 mg  1,000 mg Oral Daily Ghimire, Shanker M, MD       lidocaine  (PF) (XYLOCAINE ) 1 % injection 5 mL  5 mL Intradermal PRN Fritz Belvie DEL, NP       midodrine  (PROAMATINE ) tablet 10 mg  10 mg Oral TID WC Ghimire, Shanker M, MD       ondansetron  (ZOFRAN ) tablet 4 mg  4 mg Oral Q6H PRN Ghimire, Donalda HERO, MD       Or   ondansetron  (ZOFRAN ) injection 4 mg  4 mg Intravenous Q6H PRN Ghimire, Donalda HERO, MD       pentafluoroprop-tetrafluoroeth (GEBAUERS) aerosol 1 Application  1 Application Topical PRN Fritz Belvie DEL, NP       senna-docusate (Senokot-S) tablet 1 tablet  1 tablet Oral QHS PRN Raenelle Donalda HERO, MD       sodium chloride  flush (NS) 0.9 % injection 3 mL  3 mL Intravenous Q12H Ghimire, Donalda HERO, MD  sodium chloride   flush (NS) 0.9 % injection 3 mL  3 mL Intravenous PRN Raenelle Donalda HERO, MD       Labs: Basic Metabolic Panel: Recent Labs  Lab 01/28/24 0345 01/28/24 0843  NA 134*  --   K 4.7  --   CL 94*  --   CO2 27  --   GLUCOSE 90  --   BUN 64*  --   CREATININE 8.52* 9.13*  CALCIUM  9.1  --    CBC: Recent Labs  Lab 01/28/24 0345 01/28/24 0843  WBC 4.9 4.6  NEUTROABS 4.0  --   HGB 7.2* 7.6*  HCT 22.5* 24.1*  MCV 103.2* 104.3*  PLT 87* 92*    Dialysis Orders [facility] TTS 3.75 hours 2K/2Ca bath 350/700 AVG EDW Mircera 100 mcg q 2 weeks - last dose 01/26/24 - Venofer  50 mg weekly - last dose 8/12 Calcitriol  0.75 mcg three times per week at [facility] Sensipar  60 mg daily  Assessment/Plan:  Acute on Madeline hypoxic respiratory failure second to pulm edema: this is Brown second admission for this. She is complaint with her OP [facility]. Initial EKG Madeline troponin unremarkable. She was placed on telemetry to watch for arrhythimias. Echo ordered today. She will need her EDW lowered at her next Brown session.   ESRD:  patient in [place] today. Continue her TTS schedule while she is in Brown hospital. Patient is compliant with her OP Brown.   Madeline Brown: As above, patient has been losing weight Madeline has had her EDW adjusted, but she feels that they have not been pulling enough during Brown. Her BP has been lower recently Madeline she was just started on midodrine  by Dr. Macel. Midodrine  increased her to 10 mg TID. Her coreg  was decreased to 12.5 mg BID  Madeline Brown: Macrocytic Madeline Brown. B12 Madeline folate level pending. Patient on ESA at Brown. Last ESA given 01/26/24. Hgb 7.6. Transfuse if hgb < 7 while in hospital.  Patient gets weekly Venofer  at Brown  Metabolic bone disease: Restarted Brown binder. Needs updated  phos. She is also on Sensipar  60 mg daily.   Nutrition:  Needs updated phos. Patient started on renal diet with fluid restriction.  Belvie Och, NP 01/28/2024, 12:25 PM  [facility]

## 2024-01-28 NOTE — Plan of Care (Signed)
   Problem: Education: Goal: Knowledge of General Education information will improve Description: Including pain rating scale, medication(s)/side effects and non-pharmacologic comfort measures Outcome: Completed/Met

## 2024-01-28 NOTE — ED Triage Notes (Signed)
 Pt BIB EMS w/ complaints of SOB and CP. Pt states that she was sitting on her couch talking and had sudden onset of SOB and CP that lasted about . EMS placed on 6L w/ improvement. Hx of dialysis T/Th/Sat.

## 2024-01-28 NOTE — Procedures (Signed)
 I was present at this dialysis session. I have reviewed the session itself and made appropriate changes.   Vital signs in last 24 hours:  Temp:  [98.1 F (36.7 C)-99 F (37.2 C)] 99 F (37.2 C) (08/14 0926) Pulse Rate:  [83-95] 89 (08/14 0716) Resp:  [12-27] 20 (08/14 0830) BP: (104-137)/(79-99) 122/99 (08/14 0926) SpO2:  [91 %-98 %] 98 % (08/14 0734) Weight:  [52.1 kg] 52.1 kg (08/14 0143) Weight change:  Filed Weights   01/28/24 0143  Weight: 52.1 kg    Recent Labs  Lab 01/28/24 0345 01/28/24 0843  NA 134*  --   K 4.7  --   CL 94*  --   CO2 27  --   GLUCOSE 90  --   BUN 64*  --   CREATININE 8.52* 9.13*  CALCIUM  9.1  --     Recent Labs  Lab 01/28/24 0345  WBC 4.9  NEUTROABS 4.0  HGB 7.2*  HCT 22.5*  MCV 103.2*  PLT 87*    Scheduled Meds:  [START ON 01/29/2024] calcitRIOL   0.75 mcg Oral Q M,W,F   carvedilol   12.5 mg Oral BID WC   Chlorhexidine  Gluconate Cloth  6 each Topical Q0600   cinacalcet   30 mg Oral Daily   heparin   5,000 Units Subcutaneous Q8H   levETIRAcetam   1,000 mg Oral Daily   midodrine   10 mg Oral TID WC   sodium chloride  flush  3 mL Intravenous Q12H   Continuous Infusions:  sodium chloride      anticoagulant sodium citrate      PRN Meds:.sodium chloride , acetaminophen  **OR** acetaminophen , albuterol , alteplase , anticoagulant sodium citrate , heparin , lidocaine  (PF), ondansetron  **OR** ondansetron  (ZOFRAN ) IV, pentafluoroprop-tetrafluoroeth, senna-docusate, sodium chloride  flush   Fairy DELENA Sellar,  MD 01/28/2024, 9:43 AM

## 2024-01-28 NOTE — ED Provider Notes (Addendum)
 [place] EMERGENCY DEPARTMENT AT [facility] Provider Note   CSN: [id number] Arrival date & time: 01/28/24  0130     Patient presents with: Shortness of Breath   Madeline Brown is a 48 y.o. female.  Patient with past medical history significant for ESRD on hemodialysis Tuesday, Thursday, Saturday, seizure disorder, anemia of chronic kidney failure, intracerebral hemorrhage, CHF per problem list presents to the emergency department via EMS complaining of shortness of breath.  The patient states she was sitting on her couch talking to family, slightly reclined, and had a sudden onset of shortness of breath with some mild chest discomfort.  The chest discomfort lasted for about 15 minutes.  She continued to have shortness of breath.  EMS reports a room air oxygen saturation of 84%.  They placed her on 6 L/min nasal cannula of oxygen with improvement.  The patient denies missing any dialysis sessions over the past few weeks.  She states she was admitted for similar respiratory failure at the end of July into the beginning of August.  She denies any medication changes.  She has no nausea, vomiting, abdominal pain.  The patient states she makes very little urine.    Shortness of Breath      Prior to Admission medications   Medication Sig Start Date End Date Taking? Authorizing Provider  acetaminophen  (TYLENOL ) 500 MG tablet Take 500-1,000 mg by mouth every 6 (six) hours as needed for mild pain or headache.   Yes [provider]  albuterol  (VENTOLIN  HFA) 108 (90 Base) MCG/ACT inhaler Inhale 1-2 puffs into the lungs every 6 (six) hours as needed for wheezing or shortness of breath. 12/14/23  Yes Fleming, Conner M, PA-C  B Complex-C-Folic Acid  (DIALYVITE TABLET) TABS Take 1 tablet by mouth daily.   Yes [provider]  calcitRIOL  (ROCALTROL ) 0.25 MCG capsule Take 3 capsules (0.75 mcg total) by mouth every Monday, Wednesday, and Friday. Patient taking differently: Take  0.5-0.75 mcg by mouth See admin instructions. Take 2 to 3 capsules by mouth on Tue, Thurs, Sat morning 01/18/24  Yes Samtani, Jai-Gurmukh, MD  carvedilol  (COREG ) 25 MG tablet Take 1 tablet (25 mg total) by mouth 2 (two) times daily with a meal. Take 25 mg by mouth at 6:30 AM and 6:00 PM on Sun/Mon/Wed/Fri and 25 mg at 11:00 AM and 8:00 PM on Tues/Thurs/Sat 08/06/22 01/27/25 Yes Rhyne, Samantha J, PA-C  cinacalcet  (SENSIPAR ) 30 MG tablet Take 30 mg by mouth daily.   Yes [provider]  levETIRAcetam  (KEPPRA ) 1000 MG tablet Take 1,000 mg by mouth daily.   Yes [provider]  midodrine  (PROAMATINE ) 5 MG tablet Take 1 tablet by mouth See admin instructions. Take 1 tablet by mouth on dialysis days 01/21/24  Yes [provider]  sucroferric oxyhydroxide (VELPHORO ) 500 MG chewable tablet Chew 500 mg by mouth 3 (three) times daily with meals.   Yes [provider]  sevelamer  carbonate (RENVELA ) 800 MG tablet Take 800 mg by mouth See admin instructions. Take 800mg  (1 tab) with snacks. Patient not taking: Reported on 01/28/2024 02/10/23   [provider]    Allergies: Glycerin-hypromellose-peg 400 and Visine [tetrahydrozoline hcl]    Review of Systems  Respiratory:  Positive for shortness of breath.     Updated Vital Signs BP 118/81   Pulse 91   Temp 98.1 F (36.7 C) (Oral)   Resp 19   Ht 5' (1.524 m)   Wt 52.1 kg   LMP 12/14/2017  SpO2 98%   BMI 22.43 kg/m   Physical Exam Vitals and nursing note reviewed.  Constitutional:      General: She is not in acute distress.    Appearance: She is well-developed.  HENT:     Head: Normocephalic and atraumatic.  Eyes:     Conjunctiva/sclera: Conjunctivae normal.  Cardiovascular:     Rate and Rhythm: Normal rate and regular rhythm.  Pulmonary:     Effort: Pulmonary effort is normal. No respiratory distress.     Breath sounds: Examination of the right-lower field reveals rales. Examination of the left-lower  field reveals rales. Rales present.     Comments: Minimal Rales appreciated in bilateral lower lobes Abdominal:     Palpations: Abdomen is soft.     Tenderness: There is no abdominal tenderness.  Musculoskeletal:        General: No swelling.     Cervical back: Neck supple.  Skin:    General: Skin is warm and dry.     Capillary Refill: Capillary refill takes less than 2 seconds.  Neurological:     Mental Status: She is alert.  Psychiatric:        Mood and Affect: Mood normal.     (all labs ordered are listed, but only abnormal results are displayed) Labs Reviewed  BASIC METABOLIC PANEL WITH GFR - Abnormal; Notable for the following components:      Result Value   Sodium 134 (*)    Chloride 94 (*)    BUN 64 (*)    Creatinine, Ser 8.52 (*)    GFR, Estimated 5 (*)    All other components within normal limits  CBC WITH DIFFERENTIAL/PLATELET - Abnormal; Notable for the following components:   RBC 2.18 (*)    Hemoglobin 7.2 (*)    HCT 22.5 (*)    MCV 103.2 (*)    RDW 16.0 (*)    Platelets 87 (*)    Lymphs Abs 0.6 (*)    All other components within normal limits  BRAIN NATRIURETIC PEPTIDE - Abnormal; Notable for the following components:   B Natriuretic Peptide 2,270.9 (*)    All other components within normal limits  TROPONIN I (HIGH SENSITIVITY) - Abnormal; Notable for the following components:   Troponin I (High Sensitivity) 36 (*)    All other components within normal limits  RESP PANEL BY RT-PCR (RSV, FLU A&B, COVID)  [id number]    EKG: EKG Interpretation Date/Time:  Thursday January 28 2024 04:05:13 EDT Ventricular Rate:  93 PR Interval:  219 QRS Duration:  82 QT Interval:  387 QTC Calculation: 482 R Axis:   102  Text Interpretation: Sinus rhythm Prolonged PR interval Left atrial enlargement Anteroseptal infarct, age indeterminate Confirmed by Griselda Norris [phone]) on 01/28/2024 4:09:55 AM  Radiology: [facility] Chest 2 View Result Date: 01/28/2024 CLINICAL DATA:   Dyspnea EXAM: CHEST - 2 VIEW COMPARISON:  01/14/2024 FINDINGS: Cardiac shadow is stable. Aortic calcifications are noted. Slight increase in central vascular congestion is noted with mild pulmonary edema. No sizable effusion is seen. No bony abnormality is noted. IMPRESSION: Changes consistent with mild CHF. Electronically Signed   By: Oneil Devonshire M.D.   On: 01/28/2024 02:25     .Critical Care  Performed by: Logan Ubaldo NOVAK, PA-C Authorized by: Logan Ubaldo NOVAK DEVONNA   Critical care provider statement:    Critical care time (minutes):  30   Critical care time was exclusive of:  Separately billable procedures and treating other patients  Critical care was necessary to treat or prevent imminent or life-threatening deterioration of the following conditions:  Respiratory failure   Critical care was time spent personally by me on the following activities:  Development of treatment plan with patient or surrogate, discussions with consultants, evaluation of patient's response to treatment, examination of patient, ordering and review of laboratory studies, ordering and review of radiographic studies, ordering and performing treatments and interventions, pulse oximetry, re-evaluation of patient's condition and review of old charts   Care discussed with: admitting provider      Medications Ordered in the ED - No data to display                                  Medical Decision Making Amount and/or Complexity of Data Reviewed Labs: ordered. Radiology: ordered.   This patient presents to the ED for concern of dyspnea, this involves an extensive number of treatment options, and is a complaint that carries with it a high risk of complications and morbidity.  The differential diagnosis includes acute on chronic heart failure, ACS, pneumonia, PE, others   Co morbidities / Chronic conditions that complicate the patient evaluation  ESRD on hemodialysis   Additional history  obtained:  Additional history obtained from EMR External records from outside source obtained and reviewed including notes from recent hospitalization   Lab Tests:  I Ordered, and personally interpreted labs.  The pertinent results include: Troponin 36; other labs grossly at patient's baseline   Imaging Studies ordered:  I ordered imaging studies including chest x-ray  I independently visualized and interpreted imaging which showed changes consistent with mild CHF I agree with the radiologist interpretation   Cardiac Monitoring: / EKG:  The patient was maintained on a cardiac monitor.  I personally viewed and interpreted the cardiac monitored which showed an underlying rhythm of: Sinus rhythm  Consultations Obtained:  I requested consultation with the nephrologist, Dr.Coladonato,  and discussed lab and imaging findings as well as pertinent plan - they recommend: adding to hemodialysis schedule  Requested consultation with the hospitalist, Dr. Franky who agrees to see patient for admission   Social Determinants of Health:  Patient is a former smoker   Test / Admission - Considered:  Patient with evidence of fluid overload with faint crackles in bilateral lower lobes.  She has new oxygen demand with room air saturations in the mid 80s.  Patient needs emergent hemodialysis.  Patient with new 4 L oxygen requirement, not significantly fluid overloaded but does have signs of CHF exacerbation.  Feel patient would benefit from admission for further evaluation and management.      Final diagnoses:  SOB (shortness of breath)  Acute hemodialysis encounter [facility])    ED Discharge Orders     None          Logan Ubaldo KATHEE DEVONNA 01/28/24 0549    Logan Ubaldo KATHEE, PA-C 01/28/24 9364    Griselda Norris, MD 01/30/24 [id number]

## 2024-01-28 NOTE — H&P (Addendum)
 History and Physical    Patient: Madeline Brown FMW:[id number] DOB: Dec 04, 1975 DOA: 01/28/2024 DOS: the patient was seen and examined on 01/28/2024 PCP: [facility] [address]  Patient coming from: Home  Chief Complaint:  Chief Complaint  Patient presents with   Shortness of Breath   HPI: Madeline Brown is a 48 y.o. female with medical history significant of ESRD, HTN, seizure disorder, chronic hypoxic respiratory failure around 3-4 L of oxygen at home (does not use 24/7) who presented to the hospital for shortness of breath.  Per patient-she was in her usual state of health till yesterday-she apparently woke up around midnight last night with sudden onset of shortness of breath and chest discomfort.  Per patient-she has been compliant with her hemodialysis schedule-her outpatient nephrologist has been trying to reduce her dry weight.  She also has been compliant with diet/fluid restrictions.  She had a similar hospitalization early August-with she was admitted with volume overload/pulmonary edema.  Per patient-shortness of breath woke her up from sleep-she currently feels slightly better-requiring around 4-5 L of oxygen in the ED.  She had very vague chest discomfort that lasted for 15 minutes, area of chest discomfort was around her sternum.  There was no radiation of pain.  There was no nausea or vomiting.  She was subsequently brought to the ED by EMS-where EKG/troponins were negative-CXR confirmed pulm edema.  Hospitalist service was asked to admit for further evaluation and treatment.  No headache No fever No nausea/vomiting No diarrhea No abdominal pain No hematochezia/melena  Review of Systems: As mentioned in the history of present illness. All other systems reviewed and are negative. 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   History of blood transfusion 07/2020   Hypertension    Hypertensive  chronic kidney disease with stage 1 through stage 4 chronic kidney disease, or unspecified chronic kidney disease 06/30/2017   Intracerebral hemorrhage 02/21/2019   Intracranial hematoma (HCC) 02/20/2019   Intracranial hemorrhage (HCC)    LVH (left ventricular hypertrophy)    Pericardial effusion    Pneumonia    Pulmonary hypertension (HCC)    Seizure (HCC) 02/2019   last one was in 02/2019   Shortness of breath 07/15/2018   on 3L via [place] prn   Stroke (HCC)    Mild was told after having a seizure   Substance abuse (HCC)    Marijuana   Thrombocytopenia (HCC)    Past Surgical History:  Procedure Laterality Date   AV FISTULA PLACEMENT Left 06/24/2017   Procedure: CREATION OF LEFT ARM BRACHIALCEPHALIC  ARTERIOVENOUS (AV) FISTULA;  Surgeon: Oris Krystal FALCON, MD;  Location: [facility] OR;  Service: Vascular;  Laterality: Left;   AV FISTULA PLACEMENT Right 10/20/2022   Procedure: INSERTION OF RIGHT ARM ARTERIOVENOUS (AV) GORE-TEX GRAFT;  Surgeon: Magda Debby SAILOR, MD;  Location: [facility] OR;  Service: Vascular;  Laterality: Right;   CARDIAC CATHETERIZATION     CESAREAN SECTION     x2   COMPLEX WOUND CLOSURE Left 07/30/2020   Procedure: COMPLEX WOUND CLOSURE;  Surgeon: Magda Debby SAILOR, MD;  Location: [facility] OR;  Service: Vascular;  Laterality: Left;   FISTULA SUPERFICIALIZATION Left 10/28/2017   Procedure: SUPERFICIALIZATION LEFT BRACHIOCEPHALIC ARTERIOVENOUS FISTULA;  Surgeon: Oris Krystal FALCON, MD;  Location: [facility] OR;  Service: Vascular;  Laterality: Left;   INGUINAL HERNIA REPAIR Bilateral 02/01/2021   Procedure: LAPAROSCOPIC BILATERAL INGUINAL HERNIA REPAIR  WITH MESH;  Surgeon: Kinsinger, Herlene Righter, MD;  Location: [facility] ORS;  Service: General;  Laterality: Bilateral;   INSERTION OF DIALYSIS CATHETER Right 06/24/2017   Procedure: INSERTION OF TUNNELED DIALYSIS CATHETER;  Surgeon: Oris Krystal FALCON, MD;  Location: [facility] OR;  Service: Vascular;  Laterality: Right;   INSERTION OF DIALYSIS CATHETER Right 07/30/2020   Procedure:  INSERTION OF DIALYSIS CATHETER;  Surgeon: Magda Debby SAILOR, MD;  Location: [facility] OR;  Service: Vascular;  Laterality: Right;   INSERTION OF DIALYSIS CATHETER Right 08/06/2022   Procedure: INSERTION OF TUNNELED DIALYSIS CATHETER;  Surgeon: Magda Debby SAILOR, MD;  Location: [facility] OR;  Service: Vascular;  Laterality: Right;   LIGATION OF COMPETING BRANCHES OF ARTERIOVENOUS FISTULA  10/28/2017   Procedure: LIGATION OF COMPETING BRANCHES OF ARTERIOVENOUS FISTULA x3;  Surgeon: Oris Krystal FALCON, MD;  Location: [facility] OR;  Service: Vascular;;   REVISON OF ARTERIOVENOUS FISTULA Left 07/30/2020   Procedure: LEFT UPPER EXTREMITY ARTERIOVENOUS FISTULA REVISON;  Surgeon: Magda Debby SAILOR, MD;  Location: [facility] OR;  Service: Vascular;  Laterality: Left;  PERIPHERAL NERVE BLOCK   REVISON OF ARTERIOVENOUS FISTULA Left 08/06/2022   Procedure: LEFT UPPER EXTREMITY FISTULA LIGATION;  Surgeon: Magda Debby SAILOR, MD;  Location: [facility] OR;  Service: Vascular;  Laterality: Left;   RIGHT HEART CATH N/A 07/09/2022   Procedure: RIGHT HEART CATH;  Surgeon: Gardenia Led, DO;  Location: [facility] INVASIVE CV LAB;  Service: Cardiovascular;  Laterality: N/A;   Social History:  reports that she has quit smoking. Her smoking use included cigarettes. She started smoking about 29 years ago. She has a 7.4 pack-year smoking history. She has been exposed to tobacco smoke. She has never used smokeless tobacco. She reports that she does not currently use alcohol. She reports current drug use. Drug: Marijuana.  Allergies  Allergen Reactions   Glycerin-Hypromellose-Peg 400 Swelling   Visine [Tetrahydrozoline Hcl] Swelling and Other (See Comments)    Eyes Swell    Family History  Problem Relation Age of Onset   Diabetes Father    Hypertension Father    Cancer Father    Heart disease Father    Aneurysm Mother    Seizures Mother     Prior to Admission medications   Medication Sig Start Date End Date Taking? Authorizing Provider  acetaminophen  (TYLENOL )  500 MG tablet Take 500-1,000 mg by mouth every 6 (six) hours as needed for mild pain or headache.   Yes [provider]  albuterol  (VENTOLIN  HFA) 108 (90 Base) MCG/ACT inhaler Inhale 1-2 puffs into the lungs every 6 (six) hours as needed for wheezing or shortness of breath. 12/14/23  Yes Fleming, Conner M, PA-C  B Complex-C-Folic Acid  (DIALYVITE TABLET) TABS Take 1 tablet by mouth daily.   Yes [provider]  calcitRIOL  (ROCALTROL ) 0.25 MCG capsule Take 3 capsules (0.75 mcg total) by mouth every Monday, Wednesday, and Friday. Patient taking differently: Take 0.5-0.75 mcg by mouth See admin instructions. Take 2 to 3 capsules by mouth on Tue, Thurs, Sat morning 01/18/24  Yes Samtani, Jai-Gurmukh, MD  carvedilol  (COREG ) 25 MG tablet Take 1 tablet (25 mg total) by mouth 2 (two) times daily with a meal. Take 25 mg by mouth at 6:30 AM and 6:00 PM on Sun/Mon/Wed/Fri and 25 mg at 11:00 AM and 8:00 PM on Tues/Thurs/Sat 08/06/22 01/27/25 Yes Rhyne, Samantha J, PA-C  cinacalcet  (SENSIPAR ) 30 MG tablet Take 30 mg by mouth daily.   Yes [provider]  levETIRAcetam  (KEPPRA ) 1000 MG tablet Take 1,000  mg by mouth daily.   Yes [provider]  midodrine  (PROAMATINE ) 5 MG tablet Take 1 tablet by mouth See admin instructions. Take 1 tablet by mouth on dialysis days 01/21/24  Yes [provider]  sucroferric oxyhydroxide (VELPHORO ) 500 MG chewable tablet Chew 500 mg by mouth 3 (three) times daily with meals.   Yes [provider]  sevelamer  carbonate (RENVELA ) 800 MG tablet Take 800 mg by mouth See admin instructions. Take 800mg  (1 tab) with snacks. Patient not taking: Reported on 01/28/2024 02/10/23   [provider]    Physical Exam: Vitals:   01/28/24 0545 01/28/24 0600 01/28/24 0716 01/28/24 0734  BP: 108/83 118/81 (!) 121/93   Pulse: 83 91 89   Resp: (!) 25 19 (!) 25   Temp:   98.1 F (36.7 C)   TempSrc:      SpO2: 96% 98% 98% 98%  Weight:       Height:       Gen Exam:Alert awake-not in any distress HEENT:atraumatic, normocephalic Chest: Fine bibasilar rales. CVS:S1S2 regular Abdomen:soft non tender, non distended Extremities:no edema Neurology: Non focal Skin: no rash  Data Reviewed:     Latest Ref Rng & Units 01/28/2024    3:45 AM 01/16/2024    3:29 AM 01/15/2024    3:08 AM  CBC  WBC 4.0 - 10.5 K/uL 4.9  6.3  5.4   Hemoglobin 12.0 - 15.0 g/dL 7.2  6.4  7.0   Hematocrit 36.0 - 46.0 % 22.5  19.7  21.5   Platelets 150 - 400 K/uL 87  127  126        Latest Ref Rng & Units 01/28/2024    3:45 AM 01/16/2024   12:55 PM 01/14/2024   10:10 AM  BMP  Glucose 70 - 99 mg/dL 90  85  879   BUN 6 - 20 mg/dL 64  21  61   Creatinine 0.44 - 1.00 mg/dL 1.47  6.23  89.88   Sodium 135 - 145 mmol/L 134  136  135   Potassium 3.5 - 5.1 mmol/L 4.7  3.4  4.0   Chloride 98 - 111 mmol/L 94  95  95   CO2 22 - 32 mmol/L 27  29  26    Calcium  8.9 - 10.3 mg/dL 9.1  9.3  9.2      Assessment and Plan: Acute on chronic hypoxic respiratory failure secondary to flash pulm edema This is her second hospitalization this month-she has been apparently compliant with diet/dialysis schedule-outpatient nephrologist has been trying to lower her dry weight.  Unclear etiology for flash pul edema.  Initial EKG/troponins unremarkable.BP has not been high-infact has been started on midodrine  as outpatient Second set of troponin ordered. Place on telemetry-watch for arrhythmias. Will obtain echocardiogram Nephrology service-consulted-she will be dialyzed today  History of high output heart failure-secondary to a large AV fistula-which was ligated on 09/30/2022 by vascular surgery. This was confirmed on [facility] Echo planned. May need to involve cardiology at some point-if no other etiology for flash pulm edema is apparent.  ESRD on HD TTS See above-nephrology consulted.  History of ICH Awake alert-moving all 4 extremities symmetrically  History of  seizures Likely secondary to history of ICH Continue Keppra .  HTN Blood pressure has been soft as patient outpatient nephrologist has been trying to lower her dry weight.  She has in fact been started on midodrine  recently. Increased midodrine  to 10 mg 3 times daily today Decrease Coreg  to 12.5 mg  twice daily to allow more room for blood pressure.  Macrocytic anemia Probably multifactorial-with some element of anemia of CKD-checking B12/folate Follow H&H.   Advance Care Planning:   Code Status: Full Code   Consults: Renal  Family Communication: None at bedside.  Severity of Illness: The appropriate patient status for this patient is OBSERVATION. Observation status is judged to be reasonable and necessary in order to provide the required intensity of service to ensure the patient's safety. The patient's presenting symptoms, physical exam findings, and initial radiographic and laboratory data in the context of their medical condition is felt to place them at decreased risk for further clinical deterioration. Furthermore, it is anticipated that the patient will be medically stable for discharge from the hospital within 2 midnights of admission.   Author: Donalda Applebaum, MD 01/28/2024 8:14 AM  For on call review [email or web address].

## 2024-01-28 NOTE — Progress Notes (Signed)
 Pt receives out-pt HD at [facility] on TTS 6:00 am chair time. Will assist as needed.   Randine Mungo Dialysis Navigator [phone]

## 2024-01-28 NOTE — Progress Notes (Signed)
 Received patient in stretcher bed,she on oxygen at 6lpm/[facility] 88%,she has sob at rest.  Access used : Left arm avg that worked wel.  Duration of treatment: 3.5 hours.  Net uf :2.7 liters.  Medicine given: Midodrine  10 mg.  Hemo comment: Quit on her last 20 minutes of treatment due to cramping.  Hand off to the patient's nursr admitted into her room with stable condition via transporter.

## 2024-01-29 ENCOUNTER — Observation Stay (HOSPITAL_COMMUNITY)

## 2024-01-29 DIAGNOSIS — Z8249 Family history of ischemic heart disease and other diseases of the circulatory system: Secondary | ICD-10-CM | POA: Diagnosis not present

## 2024-01-29 DIAGNOSIS — I272 Pulmonary hypertension, unspecified: Secondary | ICD-10-CM | POA: Diagnosis present

## 2024-01-29 DIAGNOSIS — I953 Hypotension of hemodialysis: Secondary | ICD-10-CM | POA: Diagnosis present

## 2024-01-29 DIAGNOSIS — D539 Nutritional anemia, unspecified: Secondary | ICD-10-CM

## 2024-01-29 DIAGNOSIS — Z888 Allergy status to other drugs, medicaments and biological substances status: Secondary | ICD-10-CM | POA: Diagnosis not present

## 2024-01-29 DIAGNOSIS — D696 Thrombocytopenia, unspecified: Secondary | ICD-10-CM | POA: Diagnosis present

## 2024-01-29 DIAGNOSIS — G40909 Epilepsy, unspecified, not intractable, without status epilepticus: Secondary | ICD-10-CM | POA: Diagnosis present

## 2024-01-29 DIAGNOSIS — Z1152 Encounter for screening for COVID-19: Secondary | ICD-10-CM | POA: Diagnosis not present

## 2024-01-29 DIAGNOSIS — F17210 Nicotine dependence, cigarettes, uncomplicated: Secondary | ICD-10-CM | POA: Diagnosis present

## 2024-01-29 DIAGNOSIS — Z8673 Personal history of transient ischemic attack (TIA), and cerebral infarction without residual deficits: Secondary | ICD-10-CM | POA: Diagnosis not present

## 2024-01-29 DIAGNOSIS — I132 Hypertensive heart and chronic kidney disease with heart failure and with stage 5 chronic kidney disease, or end stage renal disease: Secondary | ICD-10-CM | POA: Diagnosis present

## 2024-01-29 DIAGNOSIS — N179 Acute kidney failure, unspecified: Secondary | ICD-10-CM | POA: Diagnosis present

## 2024-01-29 DIAGNOSIS — J810 Acute pulmonary edema: Secondary | ICD-10-CM

## 2024-01-29 DIAGNOSIS — Z809 Family history of malignant neoplasm, unspecified: Secondary | ICD-10-CM | POA: Diagnosis not present

## 2024-01-29 DIAGNOSIS — Z9981 Dependence on supplemental oxygen: Secondary | ICD-10-CM | POA: Diagnosis not present

## 2024-01-29 DIAGNOSIS — Z992 Dependence on renal dialysis: Secondary | ICD-10-CM | POA: Diagnosis not present

## 2024-01-29 DIAGNOSIS — R0603 Acute respiratory distress: Secondary | ICD-10-CM

## 2024-01-29 DIAGNOSIS — Z79899 Other long term (current) drug therapy: Secondary | ICD-10-CM | POA: Diagnosis not present

## 2024-01-29 DIAGNOSIS — R0602 Shortness of breath: Secondary | ICD-10-CM | POA: Diagnosis present

## 2024-01-29 DIAGNOSIS — I5083 High output heart failure: Secondary | ICD-10-CM | POA: Diagnosis present

## 2024-01-29 DIAGNOSIS — R002 Palpitations: Secondary | ICD-10-CM | POA: Diagnosis not present

## 2024-01-29 DIAGNOSIS — Z820 Family history of epilepsy and other diseases of the nervous system: Secondary | ICD-10-CM | POA: Diagnosis not present

## 2024-01-29 DIAGNOSIS — D638 Anemia in other chronic diseases classified elsewhere: Secondary | ICD-10-CM

## 2024-01-29 DIAGNOSIS — D631 Anemia in chronic kidney disease: Secondary | ICD-10-CM | POA: Diagnosis present

## 2024-01-29 DIAGNOSIS — N186 End stage renal disease: Secondary | ICD-10-CM | POA: Diagnosis present

## 2024-01-29 DIAGNOSIS — Z833 Family history of diabetes mellitus: Secondary | ICD-10-CM | POA: Diagnosis not present

## 2024-01-29 DIAGNOSIS — N2581 Secondary hyperparathyroidism of renal origin: Secondary | ICD-10-CM | POA: Diagnosis present

## 2024-01-29 DIAGNOSIS — J9621 Acute and chronic respiratory failure with hypoxia: Secondary | ICD-10-CM | POA: Diagnosis present

## 2024-01-29 DIAGNOSIS — D649 Anemia, unspecified: Secondary | ICD-10-CM | POA: Diagnosis not present

## 2024-01-29 LAB — ECHOCARDIOGRAM COMPLETE
AR max vel: 2.33 cm2
AV Area VTI: 2.36 cm2
AV Area mean vel: 2.39 cm2
AV Mean grad: 5 mmHg
AV Peak grad: 8.8 mmHg
Ao pk vel: 1.48 m/s
Area-P 1/2: 4.31 cm2
Height: 60 in
S' Lateral: 2 cm
Weight: 1851.2 [oz_av]

## 2024-01-29 LAB — CBC
HCT: 26.2 % — ABNORMAL LOW (ref 36.0–46.0)
Hemoglobin: 8.3 g/dL — ABNORMAL LOW (ref 12.0–15.0)
MCH: 32.7 pg (ref 26.0–34.0)
MCHC: 31.7 g/dL (ref 30.0–36.0)
MCV: 103.1 fL — ABNORMAL HIGH (ref 80.0–100.0)
Platelets: 129 K/uL — ABNORMAL LOW (ref 150–400)
RBC: 2.54 MIL/uL — ABNORMAL LOW (ref 3.87–5.11)
RDW: 16.2 % — ABNORMAL HIGH (ref 11.5–15.5)
WBC: 4.8 K/uL (ref 4.0–10.5)
nRBC: 0 % (ref 0.0–0.2)

## 2024-01-29 LAB — COMPREHENSIVE METABOLIC PANEL WITH GFR
ALT: 30 U/L (ref 0–44)
AST: 22 U/L (ref 15–41)
Albumin: 2.8 g/dL — ABNORMAL LOW (ref 3.5–5.0)
Alkaline Phosphatase: 48 U/L (ref 38–126)
Anion gap: 16 — ABNORMAL HIGH (ref 5–15)
BUN: 50 mg/dL — ABNORMAL HIGH (ref 6–20)
CO2: 24 mmol/L (ref 22–32)
Calcium: 9.1 mg/dL (ref 8.9–10.3)
Chloride: 96 mmol/L — ABNORMAL LOW (ref 98–111)
Creatinine, Ser: 6.66 mg/dL — ABNORMAL HIGH (ref 0.44–1.00)
GFR, Estimated: 7 mL/min — ABNORMAL LOW (ref >60)
Glucose, Bld: 119 mg/dL — ABNORMAL HIGH (ref 70–99)
Potassium: 4.5 mmol/L (ref 3.5–5.1)
Sodium: 136 mmol/L (ref 135–145)
Total Bilirubin: 1.4 mg/dL — ABNORMAL HIGH (ref 0.0–1.2)
Total Protein: 6.9 g/dL (ref 6.5–8.1)

## 2024-01-29 LAB — VITAMIN B12: Vitamin B-12: 913 pg/mL (ref 180–914)

## 2024-01-29 LAB — FOLATE: Folate: 14.4 ng/mL (ref >5.9)

## 2024-01-29 MED ORDER — METOPROLOL TARTRATE 12.5 MG HALF TABLET: 12.5000 mg | ORAL_TABLET | Freq: Two times a day (BID) | ORAL | Status: DC

## 2024-01-29 MED ORDER — CHLORHEXIDINE GLUCONATE CLOTH 2 % EX PADS: 6.0000 | MEDICATED_PAD | Freq: Every day | CUTANEOUS | Status: DC

## 2024-01-29 NOTE — Discharge Planning (Signed)
 [facility] Patient Discharge Orders - [facility] CLINIC: [place]  Patient's name: Madeline Brown Admit/DC Dates: 01/28/2024 - 01/30/24  DISCHARGE DIAGNOSES:  Acute on chronic hypoxic respiratory failure second to pulm edema: this is the second admission for this. She is complaint with her OP HD. Initial EKG and troponin unremarkable. She was placed on telemetry to watch for arrhythimias    ESRD on HD  HD ORDER CHANGES: Heparin  change: no EDW Change: 52 kg - patient losing weight - please continue to challenge patient with UF to prevent volume overload [place] Change: no  ANEMIA MANAGEMENT: Aranesp : Given: no   ESA dose for discharge: mircera per protocol IV Iron  dose at discharge: per protocol Transfusion: Given: no  BONE/MINERAL MEDICATIONS: Hectorol /Calcitriol  change: per protocol Sensipar /Parsabiv change: per protocol  ACCESS INTERVENTION/CHANGE: no change Details:   RECENT LABS: Recent Labs  Lab 01/29/24 0649  HGB 8.3*  NA 136  K 4.5  CALCIUM  9.1  ALBUMIN  2.8*    IV ANTIBIOTICS: no Details:    OTHER/APPTS/LAB ORDERS: Please get updated labs at next HD   D/C Meds to be reconciled by nurse after every discharge.  Completed By: Belvie Och, NP   Reviewed by: MD:______ RN_______

## 2024-01-29 NOTE — Plan of Care (Signed)
  Problem: Fluid Volume: Goal: Compliance with measures to maintain balanced fluid volume will improve Outcome: Completed/Met

## 2024-01-29 NOTE — Progress Notes (Addendum)
 [facility] Progress Note   Subjective:   Patient seen and examined in her room today. She reports that her breathing is doing much better after HD. Last HD they were able to yield 2.7L UF. BP is okay, on midodrine . Awaiting updated echo to check LV EF. Next HD 01/30/24  Objective Vitals:   01/28/24 1638 01/28/24 1943 01/29/24 0439 01/29/24 0710  BP: (!) 125/92 (!) 111/92 95/71 96/74   Pulse: 87 81 78 84  Resp: 18 20 17    Temp: 98 F (36.7 C) 99.1 F (37.3 C) 98 F (36.7 C) 98.2 F (36.8 C)  TempSrc: Oral Oral Oral Oral  SpO2: 98% 100% 93% 99%  Weight: 52.5 kg     Height: 5' (1.524 m)      Physical Exam Gen Exam:Alert awake-not in any distress HEENT:atraumatic, normocephalic Chest: Fine bibasilar rales. CVS:S1S2 regular Abdomen:soft non tender, non distended Extremities:no edema Neurology: Non focal Skin: no rash. Dialysis Access: L AVG, +b/t   Additional Objective Labs: Basic Metabolic Panel: Recent Labs  Lab 01/28/24 0345 01/28/24 0843 01/29/24 0649  NA 134*  --  136  K 4.7  --  4.5  CL 94*  --  96*  CO2 27  --  24  GLUCOSE 90  --  119*  BUN 64*  --  50*  CREATININE 8.52* 9.13* 6.66*  CALCIUM  9.1  --  9.1   Liver Function Tests: Recent Labs  Lab 01/29/24 0649  AST 22  ALT 30  ALKPHOS 48  BILITOT 1.4*  PROT 6.9  ALBUMIN  2.8*   No results for input(s): LIPASE, AMYLASE in the last 168 hours. CBC: Recent Labs  Lab 01/28/24 0345 01/28/24 0843 01/29/24 0649  WBC 4.9 4.6 4.8  NEUTROABS 4.0  --   --   HGB 7.2* 7.6* 8.3*  HCT 22.5* 24.1* 26.2*  MCV 103.2* 104.3* 103.1*  PLT 87* 92* 129*   Medications:   calcitRIOL   0.75 mcg Oral Q M,W,F   Chlorhexidine  Gluconate Cloth  6 each Topical Q0600   cinacalcet   30 mg Oral Daily   feeding supplement (NEPRO CARB STEADY)  237 mL Oral BID BM   heparin   5,000 Units Subcutaneous Q8H   levETIRAcetam   1,000 mg Oral Daily   [START ON 01/30/2024] metoprolol  tartrate  12.5 mg Oral BID    midodrine   10 mg Oral TID WC   sodium chloride  flush  3 mL Intravenous Q12H   Dialysis Orders [facility] TTS 3.75 hours 2K/2Ca bath 350/700 AVG EDW Mircera 100 mcg q 2 weeks - last dose 01/26/24 - Venofer  50 mg weekly - last dose 8/12 Calcitriol  0.75 mcg three times per week at HD Sensipar  60 mg daily   Assessment/Plan:  Acute on chronic hypoxic respiratory failure second to pulm edema: this is the second admission for this. She is complaint with her OP HD. Initial EKG and troponin unremarkable. She was placed on telemetry to watch for arrhythimias. Echo ordered today. She will need her EDW lowered at her next HD session.   ESRD:  last HD 01/28/24. Continue her TTS schedule while she is in the hospital. Patient is compliant with her OP HD. Next HD 01/30/24. She will need EDW lowered at next HD session.   Hypertension/volume: As above, patient has been losing weight and has had her EDW adjusted, but she feels that they have not been pulling enough during HD. Her BP has been lower recently and she was just started on midodrine  by Dr.  Peeples. Midodrine  increased her to 10 mg TID. Her coreg  was decreased to 12.5 mg BID. Patient still making urine. Start Lasix  80 BID on MWFSun on OP basis. Will send to her pharmacy.   Anemia: Macrocytic anemia. B12 and folate level pending. Patient on ESA at HD. Last ESA given 01/26/24. Hgb 8.3. Transfuse if hgb < 7 while in hospital.  Patient gets weekly Venofer  at HD  Metabolic bone disease: Restarted home binder. Needs updated  phos. She is also on Sensipar  60 mg daily.   Nutrition:  Needs updated phos. Patient started on renal diet with fluid restriction.   Belvie Och, NP 01/29/2024, 2:29 PM  [facility]

## 2024-01-29 NOTE — TOC Initial Note (Signed)
 Transition of Care [facility]) - Initial/Assessment Note    Patient Details  Name: Madeline Brown MRN: [id number] Date of Birth: [birth year]  Transition [facility]) CM/SW Contact:    Lendia Dais, LCSWA Phone Number: 01/29/2024, 10:30 AM  Clinical Narrative:  Pt is from home with significant other Vicenta Larve and two children. Pt has O2 at home and receives dialysis TTHS.   Pt reports their source of income is disability and working part time.   Pt has no hx of HH, independent with daily activities, and takes medicine as prescribed. Pt has a plan to dc home with self care.  No TOC needs at this time.                 Expected Discharge Plan: Home/Self Care Barriers to Discharge: Continued Medical Work up   Patient Goals and CMS Choice Patient states their goals for this hospitalization and ongoing recovery are:: Build up health to get on the kidney transplant list   Choice offered to / list presented to : NA      Expected Discharge Plan and Services In-house Referral: Clinical Social Work     Living arrangements for the past 2 months: Single Family Home                                      Prior Living Arrangements/Services Living arrangements for the past 2 months: Single Family Home Lives with:: Minor Children, Significant Other   Do you feel safe going back to the place where you live?: Yes      Need for Family Participation in Patient Care: Yes (Comment) Care giver support system in place?: Yes (comment)      Activities of Daily Living   ADL Screening (condition at time of admission) Independently performs ADLs?: Yes (appropriate for developmental age) Is the patient deaf or have difficulty hearing?: No Does the patient have difficulty seeing, even when wearing glasses/contacts?: No Does the patient have difficulty concentrating, remembering, or making decisions?: No  Permission Sought/Granted Permission sought to share information with : Family  Supports Permission granted to share information with : Yes, Verbal Permission Granted  Share Information with NAME: Terry,Douglas (Significant other)  [phone]           Emotional Assessment Appearance:: Appears stated age Attitude/Demeanor/Rapport: Engaged Affect (typically observed): Appropriate, Pleasant Orientation: : Oriented to Self, Oriented to Place, Oriented to  Time, Oriented to Situation Alcohol / Substance Use: Not Applicable Psych Involvement: No (comment)  Admission diagnosis:  SOB (shortness of breath) [R06.02] Acute hemodialysis encounter (HCC) [Z99.2] Volume overload [E87.70] Patient Active Problem List   Diagnosis Date Noted   Macrocytic anemia 01/29/2024   Anemia of chronic disease 01/29/2024   Flash pulmonary edema (HCC) 03/25/2021   Acute hypoxemic respiratory failure (HCC) 12/08/2019   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   Current smoker 11/28/2019   History of intracranial hemorrhage 11/28/2019   Transplanted organ and tissue status, 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/15/2019   Seizure disorder (HCC) 02/19/2019   Iron  deficiency anemia, unspecified 06/30/2017   Secondary hyperparathyroidism of renal origin (HCC) 06/30/2017   ESRD on dialysis [facility])- on T, Th, Sat 06/24/2017   Essential hypertension 03/23/2017   Anemia of chronic kidney failure, stage 5 (HCC) 02/12/2017   PCP:  [facility] [address] Pharmacy:   [facility] - [place] - [address] [place]  [address] Phone: [phone] Fax: [phone]  [facility] [address] [place] [address] Phone: [phone] Fax: [phone]     Social Drivers of Health (SDOH) Social History: SDOH Screenings   Food Insecurity: No Food Insecurity (01/28/2024)  Housing: Low Risk  (01/28/2024)  Transportation Needs: No Transportation Needs (01/28/2024)  Utilities: Not At Risk (01/28/2024)  Depression (PHQ2-9): Low Risk   (03/23/2019)  Financial Resource Strain: Low Risk  (11/03/2022)   [facility]  Tobacco Use: Medium Risk (01/28/2024)   SDOH Interventions:     Readmission Risk Interventions    08/08/2022   11:18 AM  Readmission Risk Prevention Plan  Transportation Screening Complete  HRI or Home Care Consult Complete  Social Work Consult for Recovery Care Planning/Counseling Complete  Palliative Care Screening Not Applicable  Medication Review Oceanographer) Complete

## 2024-01-29 NOTE — Progress Notes (Addendum)
 [facility] PROGRESS NOTE    Progress Note  Madeline Brown  FMW:[id number] DOB: 12-08-1975 DOA: 01/28/2024 PCP: [facility] [address]     Brief Narrative:   Madeline Brown is an 48 y.o. female past medical history significant for end-stage renal disease, essential hypertension, seizure, chronic respiratory failure with hypoxia on 3 L oxygen at home does not uses it 24 hours, comes into the hospital for shortness of breath that woke her up from her sleep.  In the ED chest x-ray showed pulmonary edema   Assessment/Plan:  Acute on chronic respiratory failure with hypoxia due to flash pulmonary edema: According to the patient she has been compliant with her dialysis treatment as an outpatient. The nephrologist been trying to lower her dry weight. Requiring 4 L of oxygen to keep saturations greater 94%. EKG and troponins are unremarkable. 2D echo has been ordered Nephrology was consulted for HD.  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  Elevated troponins: Likely due to chronic renal disease.  End-stage renal disease on hemodialysis: Nephrology has been consulted. She was dialyzed overnight.  History of ICH: Noted.  History of seizures:  continue Keppra .  Essential hypertension: Her antihypertensive medications were discontinued and she was started midodrine  as an outpatient. Discontinue Coreg  as she is on midodrine  as she usually gets after dialysis which is alpha and beta. Start her on low-dose metoprolol  tomorrow.  Macrocytic anemia: B12 and folate are pending.  Thrombocytopenia: Back at the beginning of August platelet count was 127, this morning is 92. Continue to monitor    DVT prophylaxis: lovenoxn Family Communication:one Status is: Admit to inpatient.    Code Status:     Code Status Orders  (From admission, onward)           Start     Ordered    01/28/24 0812  Full code  Continuous       Question:  By:  Answer:  Consent: discussion documented in EHR   01/28/24 0814           Code Status History     Date Active Date Inactive Code Status Order ID Comments User Context   01/14/2024 1142 01/16/2024 2300 Full Code [id number]  Georgina Basket, MD ED   08/30/2023 1313 08/31/2023 1908 Full Code [id number]  Heddy Barren, DO ED   08/06/2022 1746 08/08/2022 2202 Full Code [id number]  Arlice Reichert, MD Inpatient   06/07/2022 2136 06/10/2022 1856 Full Code [id number]  Ricky Alfrieda DASEN, DO ED   02/02/2022 1656 02/05/2022 0401 Full Code [id number]  Cleotilde Perkins, DO ED   08/18/2021 0246 08/20/2021 1802 Full Code [id number]  Mansy, Madison LABOR, MD ED   07/29/2021 0024 07/29/2021 2226 Full Code [id number]  Ricky Alfrieda DASEN, DO ED   03/25/2021 2201 03/27/2021 1613 Full Code [id number]  Raenelle Coria, MD ED   08/07/2020 0320 08/09/2020 1714 Full Code [id number]  Lonzell Emeline HERO, DO ED   07/30/2020 1743 08/01/2020 1820 Full Code [id number]  Gerome Maurilio HERO, PA-C Inpatient   12/07/2019 2251 12/09/2019 1825 Full Code [id number]  Golda Agent, MD ED   02/19/2019 1743 02/21/2019 2118 Full Code [id number]  Arminda Daved HERO, MD ED   07/11/2018 0148 07/13/2018 1801 Full Code [id number]  Alm Maxwell LABOR, MD ED   07/09/2018 0505 07/09/2018 1604 Full Code [id number]  Franky Redia SAILOR, MD ED   06/19/2017 0253 06/27/2017 1823 Full Code [id number]  [place]  SHAUNNA RIGGERS ED   02/13/2017 0807 02/19/2017 2005 Full Code [id number]  Luke Agent, MD ED         IV Access:   Peripheral IV   Procedures and diagnostic studies:   DG Chest 2 View Result Date: 01/28/2024 CLINICAL DATA:  Dyspnea EXAM: CHEST - 2 VIEW COMPARISON:  01/14/2024 FINDINGS: Cardiac shadow is stable. Aortic calcifications are noted. Slight increase in central vascular congestion is noted with mild pulmonary edema. No sizable effusion is seen. No bony abnormality is noted. IMPRESSION: Changes consistent with mild CHF. Electronically Signed    By: Oneil Devonshire M.D.   On: 01/28/2024 02:25     Medical Consultants:   None.   Subjective:    Madeline Brown she relates she feels better still short of breath  Objective:    Vitals:   01/28/24 1600 01/28/24 1638 01/28/24 1943 01/29/24 0439  BP: 119/79 (!) 125/92 (!) 111/92 95/71  Pulse: 86 87 81 78  Resp: 18 18 20 17   Temp:  98 F (36.7 C) 99.1 F (37.3 C) 98 F (36.7 C)  TempSrc:  Oral Oral Oral  SpO2: 97% 98% 100% 93%  Weight:  52.5 kg    Height:  5' (1.524 m)     SpO2: 93 % O2 Flow Rate (L/min): 4 L/min FiO2 (%): 97 %   Intake/Output Summary (Last 24 hours) at 01/29/2024 9362 Last data filed at 01/29/2024 0444 Gross per 24 hour  Intake 120 ml  Output 2700 ml  Net -2580 ml   Filed Weights   01/28/24 0143 01/28/24 1638  Weight: 52.1 kg 52.5 kg    Exam: General exam: In no acute distress. Respiratory system: Good air movement and crackles at bases Cardiovascular system: S1 & S2 heard, RRR.  No JVD Gastrointestinal system: Abdomen is nondistended, soft and nontender.  Extremities: No pedal edema. Skin: No rashes, lesions or ulcers Psychiatry: Judgement and insight appear normal. Mood & affect appropriate.    Data Reviewed:    Labs: Basic Metabolic Panel: Recent Labs  Lab 01/28/24 0345 01/28/24 0843  NA 134*  --   K 4.7  --   CL 94*  --   CO2 27  --   GLUCOSE 90  --   BUN 64*  --   CREATININE 8.52* 9.13*  CALCIUM  9.1  --    GFR Estimated Creatinine Clearance: 5.5 mL/min (A) (by C-G formula based on SCr of 9.13 mg/dL (H)). Liver Function Tests: No results for input(s): AST, ALT, ALKPHOS, BILITOT, PROT, ALBUMIN  in the last 168 hours. No results for input(s): LIPASE, AMYLASE in the last 168 hours. No results for input(s): AMMONIA in the last 168 hours. Coagulation profile No results for input(s): INR, PROTIME in the last 168 hours. COVID-19 Labs  No results for input(s): DDIMER, FERRITIN, LDH, CRP in the  last 72 hours.  Lab Results  Component Value Date   SARSCOV2NAA NEGATIVE 01/28/2024   SARSCOV2NAA NEGATIVE 08/30/2023   SARSCOV2NAA NEGATIVE 06/07/2022   SARSCOV2NAA NEGATIVE 02/02/2022    CBC: Recent Labs  Lab 01/28/24 0345 01/28/24 0843  WBC 4.9 4.6  NEUTROABS 4.0  --   HGB 7.2* 7.6*  HCT 22.5* 24.1*  MCV 103.2* 104.3*  PLT 87* 92*   Cardiac Enzymes: No results for input(s): CKTOTAL, CKMB, CKMBINDEX, TROPONINI in the last 168 hours. BNP (last 3 results) No results for input(s): PROBNP in the last 8760 hours. CBG: No results for input(s): GLUCAP in the last 168 hours. D-Dimer: No results for  input(s): DDIMER in the last 72 hours. Hgb A1c: No results for input(s): HGBA1C in the last 72 hours. Lipid Profile: No results for input(s): CHOL, HDL, LDLCALC, TRIG, CHOLHDL, LDLDIRECT in the last 72 hours. Thyroid  function studies: No results for input(s): TSH, T4TOTAL, T3FREE, THYROIDAB in the last 72 hours.  Invalid input(s): FREET3 Anemia work up: No results for input(s): VITAMINB12, FOLATE, FERRITIN, TIBC, IRON , RETICCTPCT in the last 72 hours. Sepsis Labs: Recent Labs  Lab 01/28/24 0345 01/28/24 0843  WBC 4.9 4.6   Microbiology Recent Results (from the past 240 hours)  Resp panel by RT-PCR (RSV, Flu A&B, Covid) Anterior Nasal Swab     Status: None   Collection Time: 01/28/24  1:45 AM   Specimen: Anterior Nasal Swab  Result Value Ref Range Status   SARS Coronavirus 2 by RT PCR NEGATIVE NEGATIVE Final   Influenza A by PCR NEGATIVE NEGATIVE Final   Influenza B by PCR NEGATIVE NEGATIVE Final    Comment: (NOTE) The Xpert Xpress SARS-CoV-2/FLU/RSV plus assay is intended as an aid in the diagnosis of influenza from Nasopharyngeal swab specimens and should not be used as a sole basis for treatment. Nasal washings and aspirates are unacceptable for Xpert Xpress SARS-CoV-2/FLU/RSV testing.  Fact Sheet for  Patients: BloggerCourse.com  Fact Sheet for Healthcare Providers: SeriousBroker.it  This test is not yet approved or cleared by the [place]  FDA and has been authorized for detection and/or diagnosis of SARS-CoV-2 by FDA under an Emergency Use Authorization (EUA). This EUA will remain in effect (meaning this test can be used) for the duration of the COVID-19 declaration under Section 564(b)(1) of the Act, 21 U.S.C. section 360bbb-3(b)(1), unless the authorization is terminated or revoked.     Resp Syncytial Virus by PCR NEGATIVE NEGATIVE Final    Comment: (NOTE) Fact Sheet for Patients: BloggerCourse.com  Fact Sheet for Healthcare Providers: SeriousBroker.it  This test is not yet approved or cleared by the [place]  FDA and has been authorized for detection and/or diagnosis of SARS-CoV-2 by FDA under an Emergency Use Authorization (EUA). This EUA will remain in effect (meaning this test can be used) for the duration of the COVID-19 declaration under Section 564(b)(1) of the Act, 21 U.S.C. section 360bbb-3(b)(1), unless the authorization is terminated or revoked.  Performed at [facility], [address] [place], [place] [address]   MRSA Next Gen by PCR, Nasal     Status: None   Collection Time: 01/28/24  5:18 PM   Specimen: Nasal Mucosa; Nasal Swab  Result Value Ref Range Status   MRSA by PCR Next Gen NOT DETECTED NOT DETECTED Final    Comment: (NOTE) The GeneXpert MRSA Assay (FDA approved for NASAL specimens only), is one component of a comprehensive MRSA colonization surveillance program. It is not intended to diagnose MRSA infection nor to guide or monitor treatment for MRSA infections. Test performance is not FDA approved in patients less than 52 years old. Performed at [facility], [address] , [place] [address]      Medications:     calcitRIOL   0.75 mcg Oral Q M,W,F   carvedilol   12.5 mg Oral BID WC   Chlorhexidine  Gluconate Cloth  6 each Topical Q0600   cinacalcet   30 mg Oral Daily   feeding supplement (NEPRO CARB STEADY)  237 mL Oral BID BM   heparin   5,000 Units Subcutaneous Q8H   levETIRAcetam   1,000 mg Oral Daily   midodrine   10 mg Oral  TID WC   sodium chloride  flush  3 mL Intravenous Q12H   Continuous Infusions:  sodium chloride         LOS: 0 days   Erle Odell Castor  [facility]  01/29/2024, 6:37 AM

## 2024-01-29 NOTE — Plan of Care (Signed)
  Problem: Fluid Volume: Goal: Compliance with measures to maintain balanced fluid volume will improve Outcome: Progressing   Problem: Health Behavior/Discharge Planning: Goal: Ability to manage health-related needs will improve Outcome: Progressing   Problem: Nutritional: Goal: Ability to make healthy dietary choices will improve Outcome: Progressing   Problem: Clinical Measurements: Goal: Complications related to the disease process, condition or treatment will be avoided or minimized Outcome: Progressing   Problem: Health Behavior/Discharge Planning: Goal: Ability to manage health-related needs will improve Outcome: Progressing   Problem: Clinical Measurements: Goal: Ability to maintain clinical measurements within normal limits will improve Outcome: Progressing Goal: Will remain free from infection Outcome: Progressing Goal: Diagnostic test results will improve Outcome: Progressing Goal: Respiratory complications will improve Outcome: Progressing Goal: Cardiovascular complication will be avoided Outcome: Progressing   Problem: Activity: Goal: Risk for activity intolerance will decrease Outcome: Progressing   Problem: Nutrition: Goal: Adequate nutrition will be maintained Outcome: Progressing   Problem: Coping: Goal: Level of anxiety will decrease Outcome: Progressing   Problem: Elimination: Goal: Will not experience complications related to bowel motility Outcome: Progressing Goal: Will not experience complications related to urinary retention Outcome: Progressing   Problem: Pain Managment: Goal: General experience of comfort will improve and/or be controlled Outcome: Progressing   Problem: Safety: Goal: Ability to remain free from injury will improve Outcome: Progressing   Problem: Skin Integrity: Goal: Risk for impaired skin integrity will decrease Outcome: Progressing

## 2024-01-30 DIAGNOSIS — J810 Acute pulmonary edema: Secondary | ICD-10-CM | POA: Diagnosis not present

## 2024-01-30 LAB — RENAL FUNCTION PANEL
Albumin: 2.8 g/dL — ABNORMAL LOW (ref 3.5–5.0)
Anion gap: 14 (ref 5–15)
BUN: 75 mg/dL — ABNORMAL HIGH (ref 6–20)
CO2: 25 mmol/L (ref 22–32)
Calcium: 8.9 mg/dL (ref 8.9–10.3)
Chloride: 93 mmol/L — ABNORMAL LOW (ref 98–111)
Creatinine, Ser: 9.32 mg/dL — ABNORMAL HIGH (ref 0.44–1.00)
GFR, Estimated: 5 mL/min — ABNORMAL LOW (ref >60)
Glucose, Bld: 105 mg/dL — ABNORMAL HIGH (ref 70–99)
Phosphorus: 4.7 mg/dL — ABNORMAL HIGH (ref 2.5–4.6)
Potassium: 4.2 mmol/L (ref 3.5–5.1)
Sodium: 132 mmol/L — ABNORMAL LOW (ref 135–145)

## 2024-01-30 LAB — CBC
HCT: 23.2 % — ABNORMAL LOW (ref 36.0–46.0)
Hemoglobin: 7.3 g/dL — ABNORMAL LOW (ref 12.0–15.0)
MCH: 32.6 pg (ref 26.0–34.0)
MCHC: 31.5 g/dL (ref 30.0–36.0)
MCV: 103.6 fL — ABNORMAL HIGH (ref 80.0–100.0)
Platelets: 132 K/uL — ABNORMAL LOW (ref 150–400)
RBC: 2.24 MIL/uL — ABNORMAL LOW (ref 3.87–5.11)
RDW: 16.8 % — ABNORMAL HIGH (ref 11.5–15.5)
WBC: 5.3 K/uL (ref 4.0–10.5)
nRBC: 0.6 % — ABNORMAL HIGH (ref 0.0–0.2)

## 2024-01-30 MED ORDER — ALTEPLASE 2 MG IJ SOLR: 2.0000 mg | Freq: Once | INTRAMUSCULAR | Status: DC | PRN

## 2024-01-30 MED ORDER — FUROSEMIDE 40 MG PO TABS
80.0000 mg | ORAL_TABLET | Freq: Two times a day (BID) | ORAL | Status: AC
  Administered 2024-01-30: 80 mg via ORAL
  Filled 2024-01-30 (×2): qty 2

## 2024-01-30 MED ORDER — LIDOCAINE HCL (PF) 1 % IJ SOLN: 5.0000 mL | INTRAMUSCULAR | Status: DC | PRN

## 2024-01-30 MED ORDER — ANTICOAGULANT SODIUM CITRATE 4% (200MG/5ML) IV SOLN: 5.0000 mL | Status: DC | PRN

## 2024-01-30 MED ORDER — PENTAFLUOROPROP-TETRAFLUOROETH EX AERO
1.0000 | INHALATION_SPRAY | CUTANEOUS | Status: DC | PRN
Start: 2024-01-30 — End: 2024-01-30

## 2024-01-30 NOTE — Progress Notes (Signed)
 [facility] Progress Note   Subjective:   Patient seen and examined at bedside in dialysis. Breathing much better but continues to require O2.  Denies chest pain, SOB and edema.   Objective Vitals:   01/30/24 0930 01/30/24 0950 01/30/24 1010 01/30/24 1020  BP:  103/73 94/69 95/67   Pulse: 84 83 85 80  Resp: (!) 21 (!) 26 (!) 21 (!) 22  Temp:      TempSrc:      SpO2: 97% 98% 97% 98%  Weight:      Height:       Physical Exam General:chronically ill appearing female in NAD Heart:RRR, no mrg Lungs:CTAB, nml WOB on 4LO2 Abdomen:soft, NTND Extremities:no LE edema Dialysis Access: LU AVG in use   Filed Weights   01/28/24 0143 01/28/24 1638 01/30/24 0807  Weight: 52.1 kg 52.5 kg 52.7 kg    Intake/Output Summary (Last 24 hours) at 01/30/2024 1058 Last data filed at 01/30/2024 0600 Gross per 24 hour  Intake 360 ml  Output 0 ml  Net 360 ml    Additional Objective Labs: Basic Metabolic Panel: Recent Labs  Lab 01/28/24 0345 01/28/24 0843 01/29/24 0649 01/30/24 0900  NA 134*  --  136 132*  K 4.7  --  4.5 4.2  CL 94*  --  96* 93*  CO2 27  --  24 25  GLUCOSE 90  --  119* 105*  BUN 64*  --  50* 75*  CREATININE 8.52* 9.13* 6.66* 9.32*  CALCIUM  9.1  --  9.1 8.9  PHOS  --   --   --  4.7*   Liver Function Tests: Recent Labs  Lab 01/29/24 0649 01/30/24 0900  AST 22  --   ALT 30  --   ALKPHOS 48  --   BILITOT 1.4*  --   PROT 6.9  --   ALBUMIN  2.8* 2.8*   CBC: Recent Labs  Lab 01/28/24 0345 01/28/24 0843 01/29/24 0649 01/30/24 0900  WBC 4.9 4.6 4.8 5.3  NEUTROABS 4.0  --   --   --   HGB 7.2* 7.6* 8.3* 7.3*  HCT 22.5* 24.1* 26.2* 23.2*  MCV 103.2* 104.3* 103.1* 103.6*  PLT 87* 92* 129* 132*     Studies/Results: ECHOCARDIOGRAM COMPLETE Result Date: 01/29/2024    ECHOCARDIOGRAM REPORT   Patient Name:   Madeline Brown Date of Exam: 01/29/2024 Medical Rec #:  [id number]      Height:       60.0 in Accession #:    [id number]     Weight:       115.7  lb Date of Birth:  March 14, 1976       BSA:          1.479 m Patient Age:    48 years       BP:           96/74 mmHg Patient Gender: F              HR:           76 bpm. Exam Location:  Inpatient Procedure: 2D Echo, Cardiac Doppler and Color Doppler (Both Spectral and Color            Flow Doppler were utilized during procedure). Indications:    Acute respiratory distress  History:        Patient has prior history of Echocardiogram examinations, most                 recent 02/04/2022. ESRD;  Risk Factors:Hypertension.  Sonographer:    Therisa Crouch Referring Phys: LEOMA DONALDA HERO [facility] IMPRESSIONS  1. Left ventricular ejection fraction, by estimation, is 70 to 75%. The left ventricle has hyperdynamic function. The left ventricle has no regional wall motion abnormalities. There is moderate concentric left ventricular hypertrophy. Left ventricular diastolic parameters were normal.  2. Right ventricular systolic function is moderately reduced. The right ventricular size is mildly enlarged.  3. Right atrial size was mild to moderately dilated.  4. The mitral valve is normal in structure. Mild mitral valve regurgitation.  5. The aortic valve is tricuspid. Aortic valve regurgitation is not visualized. Aortic valve sclerosis/calcification is present, without any evidence of aortic stenosis.  6. The inferior vena cava is normal in size with greater than 50% respiratory variability, suggesting right atrial pressure of 3 mmHg. FINDINGS  Left Ventricle: Left ventricular ejection fraction, by estimation, is 70 to 75%. The left ventricle has hyperdynamic function. The left ventricle has no regional wall motion abnormalities. The left ventricular internal cavity size was normal in size. There is moderate concentric left ventricular hypertrophy. Left ventricular diastolic parameters were normal. Right Ventricle: The right ventricular size is mildly enlarged. Right vetricular wall thickness was not assessed. Right ventricular systolic  function is moderately reduced. Left Atrium: Left atrial size was normal in size. Right Atrium: Right atrial size was mild to moderately dilated. Pericardium: Trivial pericardial effusion is present. Mitral Valve: The mitral valve is normal in structure. Mild mitral valve regurgitation. Tricuspid Valve: The tricuspid valve is normal in structure. Tricuspid valve regurgitation is mild. Aortic Valve: The aortic valve is tricuspid. Aortic valve regurgitation is not visualized. Aortic valve sclerosis/calcification is present, without any evidence of aortic stenosis. Aortic valve mean gradient measures 5.0 mmHg. Aortic valve peak gradient measures 8.8 mmHg. Aortic valve area, by VTI measures 2.36 cm. Pulmonic Valve: The pulmonic valve was normal in structure. Pulmonic valve regurgitation is mild to moderate. Aorta: The aortic root is normal in size and structure. Venous: The inferior vena cava is normal in size with greater than 50% respiratory variability, suggesting right atrial pressure of 3 mmHg. IAS/Shunts: No atrial level shunt detected by color flow Doppler.  LEFT VENTRICLE PLAX 2D LVIDd:         3.60 cm   Diastology LVIDs:         2.00 cm   LV e' medial:    5.87 cm/s LV PW:         1.30 cm   LV E/e' medial:  7.9 LV IVS:        1.50 cm   LV e' lateral:   10.00 cm/s LVOT diam:     2.00 cm   LV E/e' lateral: 4.7 LV SV:         58 LV SV Index:   40 LVOT Area:     3.14 cm  RIGHT VENTRICLE          IVC RV Basal diam:  4.60 cm  IVC diam: 2.00 cm RV Mid diam:    3.70 cm RVOT diam:      3.20 cm LEFT ATRIUM             Index        RIGHT ATRIUM           Index LA diam:        2.70 cm 1.83 cm/m   RA Area:     20.10 cm LA Vol (A2C):   35.3 ml 23.86 ml/m  RA Volume:   57.00 ml  38.53 ml/m LA Vol (A4C):   42.1 ml 28.46 ml/m LA Biplane Vol: 40.9 ml 27.65 ml/m  AORTIC VALVE AV Area (Vmax):    2.33 cm AV Area (Vmean):   2.39 cm AV Area (VTI):     2.36 cm AV Vmax:           148.00 cm/s AV Vmean:          97.000 cm/s AV  VTI:            0.248 m AV Peak Grad:      8.8 mmHg AV Mean Grad:      5.0 mmHg LVOT Vmax:         110.00 cm/s LVOT Vmean:        73.900 cm/s LVOT VTI:          0.186 m LVOT/AV VTI ratio: 0.75  AORTA Ao Root diam: 3.10 cm MITRAL VALVE MV Area (PHT): 4.31 cm    SHUNTS MV Decel Time: 176 msec    Systemic VTI:  0.19 m MV E velocity: 46.60 cm/s  Systemic Diam: 2.00 cm MV A velocity: 67.60 cm/s  Pulmonic Diam: 3.20 cm MV E/A ratio:  0.69 Vina Gull MD Electronically signed by Vina Gull MD Signature Date/Time: 01/29/2024/3:21:13 PM    Final     Medications:  anticoagulant sodium citrate       calcitRIOL   0.75 mcg Oral Q M,W,F   Chlorhexidine  Gluconate Cloth  6 each Topical Q0600   cinacalcet   30 mg Oral Daily   feeding supplement (NEPRO CARB STEADY)  237 mL Oral BID BM   heparin   5,000 Units Subcutaneous Q8H   levETIRAcetam   1,000 mg Oral Daily   metoprolol  tartrate  12.5 mg Oral BID   midodrine   10 mg Oral TID WC   sodium chloride  flush  3 mL Intravenous Q12H    Dialysis Orders:  [facility] TTS 3.75 hours EDW 48kg 2K/2Ca bath 350/700 AVG EDW Mircera 100 mcg q 2 weeks - last dose 01/26/24 - Venofer  50 mg weekly - last dose 8/12 Calcitriol  0.75 mcg three times per week at HD Sensipar  60 mg daily   Assessment/Plan:  Acute on chronic hypoxic respiratory failure second to pulm edema: this is the second admission for this. CXR with mild pulmonary edema. Compliant with HD but had not been getting to her EDW.  Should be close to EDW today.  ECHO with EF 70-75%, LVH, moderately reduced right systolic function, no valvular disease.  ESRD:  Continue her TTS schedule while she is in the hospital. Patient is compliant with her OP HD. HD today.    Hypertension/volume: As above, patient has been losing weight and has had her EDW adjusted, but she feels that they have not been pulling enough during HD. She has not gotten down to EDW in a few weeks.  Should be close today. Her BP has been  lower recently and she was just started on midodrine  by Dr. Macel. Midodrine  increased her to 10 mg TID. Beta blocker stopped d/t hypotension. Patient still making urine. Start Lasix  80 BID on MWFSun. Rx sent to pharm by previous provider.    Anemia: Macrocytic anemia. B12 and folate level in goal. Patient on ESA at HD. Last ESA given 01/26/24. Hgb 7.3. Transfuse if hgb < 7 while in hospital.  Patient gets weekly Venofer  at HD. Likely need to increase outpatient ESA dose.   Metabolic bone disease: Ca and phos in  goal.  Continue VDRA and Sensipar  60 mg daily. Not on binders while here. On velphoro  1 AC TID at home.    Nutrition:  Renal diet with fluid restriction.protein supplements.   Manuelita Labella, PA-C [facility] 01/30/2024,10:58 AM  LOS: 1 day

## 2024-01-30 NOTE — Progress Notes (Signed)
 [facility] PROGRESS NOTE    Progress Note  Madeline Brown  FMW:[id number] DOB: [birth year] DOA: 01/28/2024 PCP: [facility] [address]     Brief Narrative:   Madeline Brown is an 48 y.o. female past medical history significant for end-stage renal disease, essential hypertension, seizure, chronic respiratory failure with hypoxia on 3 L oxygen at home does not uses it 24 hours, comes into the hospital for shortness of breath that woke her up from her sleep.  In the ED chest x-ray showed pulmonary edema   Assessment/Plan:  Acute on chronic respiratory failure with hypoxia due to flash pulmonary edema: According to the patient she has been compliant with her dialysis treatment as an outpatient. The nephrologist been trying to lower her dry weight. Echo EF 70 %,. No WMA, No AS. Requiring 4 L of oxygen to keep saturations greater 94%. EKG and troponins are unremarkable. Renal on board, in HD today. Renal tryain to drop her EDW Wean to room air, OOB, IC.  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  Elevated troponins: Likely due to chronic renal disease.  End-stage renal disease on hemodialysis: Nephrology has been consulted. She was dialyzed overnight.  History of ICH: Noted.  History of seizures:  continue Keppra .  Essential hypertension: Cont Midodrine . Cont Metoprolol .  Macrocytic anemia: B12 and folate are pending.  Thrombocytopenia: Back at the beginning of August platelet count was 127, this morning is 92. Continue to monitor    DVT prophylaxis: lovenoxn Family Communication:one Status is: Admit to inpatient.    Code Status:     Code Status Orders  (From admission, onward)           Start     Ordered   01/28/24 0812  Full code  Continuous       Question:  By:  Answer:  Consent: discussion documented in EHR   01/28/24 0814           Code  Status History     Date Active Date Inactive Code Status Order ID Comments User Context   01/14/2024 1142 01/16/2024 2300 Full Code [id number]  Georgina Basket, MD ED   08/30/2023 1313 08/31/2023 1908 Full Code [id number]  Heddy Barren, DO ED   08/06/2022 1746 08/08/2022 2202 Full Code [id number]  Arlice Reichert, MD Inpatient   06/07/2022 2136 06/10/2022 1856 Full Code [id number]  Ricky Alfrieda DASEN, DO ED   02/02/2022 1656 02/05/2022 0401 Full Code [id number]  Cleotilde Perkins, DO ED   08/18/2021 0246 08/20/2021 1802 Full Code [id number]  Mansy, Madison LABOR, MD ED   07/29/2021 0024 07/29/2021 2226 Full Code [id number]  Ricky Alfrieda DASEN, DO ED   03/25/2021 2201 03/27/2021 1613 Full Code [id number]  Raenelle Coria, MD ED   08/07/2020 0320 08/09/2020 1714 Full Code [id number]  Lonzell Emeline HERO, DO ED   07/30/2020 1743 08/01/2020 1820 Full Code [id number]  Gerome Maurilio HERO, PA-C Inpatient   12/07/2019 2251 12/09/2019 1825 Full Code [id number]  Golda Agent, MD ED   02/19/2019 1743 02/21/2019 2118 Full Code [id number]  Arminda Daved HERO, MD ED   07/11/2018 0148 07/13/2018 1801 Full Code [id number]  Alm Maxwell LABOR, MD ED   07/09/2018 0505 07/09/2018 1604 Full Code [id number]  Franky Redia SAILOR, MD ED   06/19/2017 0253 06/27/2017 1823 Full Code [id number]  Meade Sammi SQUIBB, PA-C ED   02/13/2017 0807 02/19/2017 2005 Full Code [id number]  Luke Agent, MD ED  IV Access:   Peripheral IV   Procedures and diagnostic studies:   ECHOCARDIOGRAM COMPLETE Result Date: 01/29/2024    ECHOCARDIOGRAM REPORT   Patient Name:   Madeline Brown Date of Exam: 01/29/2024 Medical Rec #:  [id number]      Height:       60.0 in Accession #:    [id number]     Weight:       115.7 lb Date of Birth:  1975/09/07       BSA:          1.479 m Patient Age:    48 years       BP:           96/74 mmHg Patient Gender: F              HR:           76 bpm. Exam Location:  Inpatient Procedure: 2D Echo, Cardiac Doppler and Color Doppler (Both Spectral and Color            Flow Doppler were  utilized during procedure). Indications:    Acute respiratory distress  History:        Patient has prior history of Echocardiogram examinations, most                 recent 02/04/2022. ESRD; Risk Factors:Hypertension.  Sonographer:    Therisa Crouch Referring Phys: LEOMA DONALDA HERO [facility] IMPRESSIONS  1. Left ventricular ejection fraction, by estimation, is 70 to 75%. The left ventricle has hyperdynamic function. The left ventricle has no regional wall motion abnormalities. There is moderate concentric left ventricular hypertrophy. Left ventricular diastolic parameters were normal.  2. Right ventricular systolic function is moderately reduced. The right ventricular size is mildly enlarged.  3. Right atrial size was mild to moderately dilated.  4. The mitral valve is normal in structure. Mild mitral valve regurgitation.  5. The aortic valve is tricuspid. Aortic valve regurgitation is not visualized. Aortic valve sclerosis/calcification is present, without any evidence of aortic stenosis.  6. The inferior vena cava is normal in size with greater than 50% respiratory variability, suggesting right atrial pressure of 3 mmHg. FINDINGS  Left Ventricle: Left ventricular ejection fraction, by estimation, is 70 to 75%. The left ventricle has hyperdynamic function. The left ventricle has no regional wall motion abnormalities. The left ventricular internal cavity size was normal in size. There is moderate concentric left ventricular hypertrophy. Left ventricular diastolic parameters were normal. Right Ventricle: The right ventricular size is mildly enlarged. Right vetricular wall thickness was not assessed. Right ventricular systolic function is moderately reduced. Left Atrium: Left atrial size was normal in size. Right Atrium: Right atrial size was mild to moderately dilated. Pericardium: Trivial pericardial effusion is present. Mitral Valve: The mitral valve is normal in structure. Mild mitral valve regurgitation. Tricuspid  Valve: The tricuspid valve is normal in structure. Tricuspid valve regurgitation is mild. Aortic Valve: The aortic valve is tricuspid. Aortic valve regurgitation is not visualized. Aortic valve sclerosis/calcification is present, without any evidence of aortic stenosis. Aortic valve mean gradient measures 5.0 mmHg. Aortic valve peak gradient measures 8.8 mmHg. Aortic valve area, by VTI measures 2.36 cm. Pulmonic Valve: The pulmonic valve was normal in structure. Pulmonic valve regurgitation is mild to moderate. Aorta: The aortic root is normal in size and structure. Venous: The inferior vena cava is normal in size with greater than 50% respiratory variability, suggesting right atrial pressure of 3 mmHg. IAS/Shunts: No atrial level shunt detected by  color flow Doppler.  LEFT VENTRICLE PLAX 2D LVIDd:         3.60 cm   Diastology LVIDs:         2.00 cm   LV e' medial:    5.87 cm/s LV PW:         1.30 cm   LV E/e' medial:  7.9 LV IVS:        1.50 cm   LV e' lateral:   10.00 cm/s LVOT diam:     2.00 cm   LV E/e' lateral: 4.7 LV SV:         58 LV SV Index:   40 LVOT Area:     3.14 cm  RIGHT VENTRICLE          IVC RV Basal diam:  4.60 cm  IVC diam: 2.00 cm RV Mid diam:    3.70 cm RVOT diam:      3.20 cm LEFT ATRIUM             Index        RIGHT ATRIUM           Index LA diam:        2.70 cm 1.83 cm/m   RA Area:     20.10 cm LA Vol (A2C):   35.3 ml 23.86 ml/m  RA Volume:   57.00 ml  38.53 ml/m LA Vol (A4C):   42.1 ml 28.46 ml/m LA Biplane Vol: 40.9 ml 27.65 ml/m  AORTIC VALVE AV Area (Vmax):    2.33 cm AV Area (Vmean):   2.39 cm AV Area (VTI):     2.36 cm AV Vmax:           148.00 cm/s AV Vmean:          97.000 cm/s AV VTI:            0.248 m AV Peak Grad:      8.8 mmHg AV Mean Grad:      5.0 mmHg LVOT Vmax:         110.00 cm/s LVOT Vmean:        73.900 cm/s LVOT VTI:          0.186 m LVOT/AV VTI ratio: 0.75  AORTA Ao Root diam: 3.10 cm MITRAL VALVE MV Area (PHT): 4.31 cm    SHUNTS MV Decel Time: 176 msec     Systemic VTI:  0.19 m MV E velocity: 46.60 cm/s  Systemic Diam: 2.00 cm MV A velocity: 67.60 cm/s  Pulmonic Diam: 3.20 cm MV E/A ratio:  0.69 Vina Gull MD Electronically signed by Vina Gull MD Signature Date/Time: 01/29/2024/3:21:13 PM    Final      Medical Consultants:   None.   Subjective:    Madeline Brown SOB about the same  Objective:    Vitals:   01/30/24 0807 01/30/24 0816 01/30/24 0820 01/30/24 0850  BP: 114/78 108/84 120/81 108/77  Pulse: 78 83 87 81  Resp: 17 18 20  (!) 23  Temp: 97.8 F (36.6 C)     TempSrc:      SpO2: 95% 96% 96% 96%  Weight: 52.7 kg     Height:       SpO2: 96 % O2 Flow Rate (L/min): 4 L/min FiO2 (%): 97 %   Intake/Output Summary (Last 24 hours) at 01/30/2024 0908 Last data filed at 01/30/2024 0600 Gross per 24 hour  Intake 360 ml  Output 0 ml  Net 360 ml   [employer]  01/28/24 0143 01/28/24 1638 01/30/24 0807  Weight: 52.1 kg 52.5 kg 52.7 kg    Exam: General exam: In no acute distress. Respiratory system: Good air movement and cont to have crackles at bases Cardiovascular system: S1 & S2 heard, RRR.  No JVD Gastrointestinal system: Abdomen is nondistended, soft and nontender.  Extremities: No pedal edema. Skin: No rashes, lesions or ulcers Psychiatry: Judgement and insight appear normal. Mood & affect appropriate.    Data Reviewed:    Labs: Basic Metabolic Panel: Recent Labs  Lab 01/28/24 0345 01/28/24 0843 01/29/24 0649  NA 134*  --  136  K 4.7  --  4.5  CL 94*  --  96*  CO2 27  --  24  GLUCOSE 90  --  119*  BUN 64*  --  50*  CREATININE 8.52* 9.13* 6.66*  CALCIUM  9.1  --  9.1   GFR Estimated Creatinine Clearance: 7.5 mL/min (A) (by C-G formula based on SCr of 6.66 mg/dL (H)). Liver Function Tests: Recent Labs  Lab 01/29/24 0649  AST 22  ALT 30  ALKPHOS 48  BILITOT 1.4*  PROT 6.9  ALBUMIN  2.8*   No results for input(s): LIPASE, AMYLASE in the last 168 hours. No results for input(s): AMMONIA  in the last 168 hours. Coagulation profile No results for input(s): INR, PROTIME in the last 168 hours. COVID-19 Labs  No results for input(s): DDIMER, FERRITIN, LDH, CRP in the last 72 hours.  Lab Results  Component Value Date   SARSCOV2NAA NEGATIVE 01/28/2024   SARSCOV2NAA NEGATIVE 08/30/2023   SARSCOV2NAA NEGATIVE 06/07/2022   SARSCOV2NAA NEGATIVE 02/02/2022    CBC: Recent Labs  Lab 01/28/24 0345 01/28/24 0843 01/29/24 0649  WBC 4.9 4.6 4.8  NEUTROABS 4.0  --   --   HGB 7.2* 7.6* 8.3*  HCT 22.5* 24.1* 26.2*  MCV 103.2* 104.3* 103.1*  PLT 87* 92* 129*   Cardiac Enzymes: No results for input(s): CKTOTAL, CKMB, CKMBINDEX, TROPONINI in the last 168 hours. BNP (last 3 results) No results for input(s): PROBNP in the last 8760 hours. CBG: No results for input(s): GLUCAP in the last 168 hours. D-Dimer: No results for input(s): DDIMER in the last 72 hours. Hgb A1c: No results for input(s): HGBA1C in the last 72 hours. Lipid Profile: No results for input(s): CHOL, HDL, LDLCALC, TRIG, CHOLHDL, LDLDIRECT in the last 72 hours. Thyroid  function studies: No results for input(s): TSH, T4TOTAL, T3FREE, THYROIDAB in the last 72 hours.  Invalid input(s): FREET3 Anemia work up: Recent Labs    01/29/24 0649  VITAMINB12 913  FOLATE 14.4   Sepsis Labs: Recent Labs  Lab 01/28/24 0345 01/28/24 0843 01/29/24 0649  WBC 4.9 4.6 4.8   Microbiology Recent Results (from the past 240 hours)  Resp panel by RT-PCR (RSV, Flu A&B, Covid) Anterior Nasal Swab     Status: None   Collection Time: 01/28/24  1:45 AM   Specimen: Anterior Nasal Swab  Result Value Ref Range Status   SARS Coronavirus 2 by RT PCR NEGATIVE NEGATIVE Final   Influenza A by PCR NEGATIVE NEGATIVE Final   Influenza B by PCR NEGATIVE NEGATIVE Final    Comment: (NOTE) The Xpert Xpress SARS-CoV-2/FLU/RSV plus assay is intended as an aid in the diagnosis of influenza  from Nasopharyngeal swab specimens and should not be used as a sole basis for treatment. Nasal washings and aspirates are unacceptable for Xpert Xpress SARS-CoV-2/FLU/RSV testing.  Fact Sheet for Patients: BloggerCourse.com  Fact Sheet for Healthcare Providers: SeriousBroker.it  This test is not yet approved or cleared by the [place]  FDA and has been authorized for detection and/or diagnosis of SARS-CoV-2 by FDA under an Emergency Use Authorization (EUA). This EUA will remain in effect (meaning this test can be used) for the duration of the COVID-19 declaration under Section 564(b)(1) of the Act, 21 U.S.C. section 360bbb-3(b)(1), unless the authorization is terminated or revoked.     Resp Syncytial Virus by PCR NEGATIVE NEGATIVE Final    Comment: (NOTE) Fact Sheet for Patients: BloggerCourse.com  Fact Sheet for Healthcare Providers: SeriousBroker.it  This test is not yet approved or cleared by the [place]  FDA and has been authorized for detection and/or diagnosis of SARS-CoV-2 by FDA under an Emergency Use Authorization (EUA). This EUA will remain in effect (meaning this test can be used) for the duration of the COVID-19 declaration under Section 564(b)(1) of the Act, 21 U.S.C. section 360bbb-3(b)(1), unless the authorization is terminated or revoked.  Performed at [facility], [address] [place], [place] [address]   MRSA Next Gen by PCR, Nasal     Status: None   Collection Time: 01/28/24  5:18 PM   Specimen: Nasal Mucosa; Nasal Swab  Result Value Ref Range Status   MRSA by PCR Next Gen NOT DETECTED NOT DETECTED Final    Comment: (NOTE) The GeneXpert MRSA Assay (FDA approved for NASAL specimens only), is one component of a comprehensive MRSA colonization surveillance program. It is not intended to diagnose MRSA infection nor to guide or monitor  treatment for MRSA infections. Test performance is not FDA approved in patients less than 33 years old. Performed at [facility], [address] [place], [place] [address]      Medications:    calcitRIOL   0.75 mcg Oral Q M,W,F   Chlorhexidine  Gluconate Cloth  6 each Topical Q0600   cinacalcet   30 mg Oral Daily   feeding supplement (NEPRO CARB STEADY)  237 mL Oral BID BM   heparin   5,000 Units Subcutaneous Q8H   levETIRAcetam   1,000 mg Oral Daily   metoprolol  tartrate  12.5 mg Oral BID   midodrine   10 mg Oral TID WC   sodium chloride  flush  3 mL Intravenous Q12H   Continuous Infusions:  anticoagulant sodium citrate         LOS: 1 day   Erle Odell Castor  [facility]  01/30/2024, 9:08 AM

## 2024-01-30 NOTE — Plan of Care (Signed)
  Problem: Health Behavior/Discharge Planning: Goal: Ability to manage health-related needs will improve Outcome: Progressing   Problem: Nutritional: Goal: Ability to make healthy dietary choices will improve Outcome: Progressing   Problem: Clinical Measurements: Goal: Complications related to the disease process, condition or treatment will be avoided or minimized Outcome: Progressing   Problem: Health Behavior/Discharge Planning: Goal: Ability to manage health-related needs will improve Outcome: Progressing   Problem: Clinical Measurements: Goal: Ability to maintain clinical measurements within normal limits will improve Outcome: Progressing Goal: Will remain free from infection Outcome: Progressing Goal: Diagnostic test results will improve Outcome: Progressing Goal: Respiratory complications will improve Outcome: Progressing Goal: Cardiovascular complication will be avoided Outcome: Progressing   Problem: Activity: Goal: Risk for activity intolerance will decrease Outcome: Progressing   Problem: Nutrition: Goal: Adequate nutrition will be maintained Outcome: Progressing

## 2024-01-31 ENCOUNTER — Inpatient Hospital Stay (HOSPITAL_COMMUNITY)

## 2024-01-31 DIAGNOSIS — J810 Acute pulmonary edema: Secondary | ICD-10-CM | POA: Diagnosis not present

## 2024-01-31 LAB — HEPATITIS B SURFACE ANTIGEN: Hepatitis B Surface Ag: NONREACTIVE

## 2024-01-31 MED ORDER — HEPARIN SODIUM (PORCINE) 1000 UNIT/ML DIALYSIS: 1000.0000 [IU] | INTRAMUSCULAR | Status: DC | PRN

## 2024-01-31 MED ORDER — MIDODRINE HCL 5 MG PO TABS
10.0000 mg | ORAL_TABLET | Freq: Once | ORAL | Status: AC
  Administered 2024-01-31: 10 mg via ORAL
  Filled 2024-01-31: qty 2

## 2024-01-31 MED ORDER — ANTICOAGULANT SODIUM CITRATE 4% (200MG/5ML) IV SOLN
5.0000 mL | Status: DC | PRN
Start: 2024-01-31 — End: 2024-02-01

## 2024-01-31 MED ORDER — PENTAFLUOROPROP-TETRAFLUOROETH EX AERO
1.0000 | INHALATION_SPRAY | CUTANEOUS | Status: DC | PRN
Start: 2024-01-31 — End: 2024-02-01

## 2024-01-31 MED ORDER — ALTEPLASE 2 MG IJ SOLR: 2.0000 mg | Freq: Once | INTRAMUSCULAR | Status: DC | PRN

## 2024-01-31 MED ORDER — LIDOCAINE-PRILOCAINE 2.5-2.5 % EX CREA
1.0000 | TOPICAL_CREAM | CUTANEOUS | Status: DC | PRN
Start: 2024-01-31 — End: 2024-02-01

## 2024-01-31 MED ORDER — CHLORHEXIDINE GLUCONATE CLOTH 2 % EX PADS
6.0000 | MEDICATED_PAD | Freq: Every day | CUTANEOUS | Status: DC
  Administered 2024-02-02: 6 via TOPICAL

## 2024-01-31 MED ORDER — LIDOCAINE HCL (PF) 1 % IJ SOLN: 5.0000 mL | INTRAMUSCULAR | Status: DC | PRN

## 2024-01-31 NOTE — Progress Notes (Signed)
 [facility] Progress Note   Subjective:   Patient seen and examined at bedside.  Reports they attempted to wean O2 this AM but she became SOB on 0.5L after about .  Back on 2L. Denies CP, edema, abdominal pain and n/v/d.   Objective Vitals:   01/31/24 0436 01/31/24 0818 01/31/24 0918 01/31/24 1134  BP: 95/72 96/75 105/79   Pulse: 85 89 89   Resp: 16 18    Temp: 98.6 F (37 C) 98.1 F (36.7 C)    TempSrc: Oral     SpO2: 92% 95% 94% 98%  Weight:      Height:       Physical Exam General:chronically ill appearing female in NAD Heart:RRR, no mrg Lungs:CTAB, nml WOB on RA Abdomen:soft, ND Extremities:no LE edema Dialysis Access: LU AVG +b   Filed Weights   01/28/24 1638 01/30/24 0807 01/30/24 1201  Weight: 52.5 kg 52.7 kg 51.9 kg    Intake/Output Summary (Last 24 hours) at 01/31/2024 1529 Last data filed at 01/31/2024 0447 Gross per 24 hour  Intake --  Output 0 ml  Net 0 ml    Additional Objective Labs: Basic Metabolic Panel: Recent Labs  Lab 01/28/24 0345 01/28/24 0843 01/29/24 0649 01/30/24 0900  NA 134*  --  136 132*  K 4.7  --  4.5 4.2  CL 94*  --  96* 93*  CO2 27  --  24 25  GLUCOSE 90  --  119* 105*  BUN 64*  --  50* 75*  CREATININE 8.52* 9.13* 6.66* 9.32*  CALCIUM  9.1  --  9.1 8.9  PHOS  --   --   --  4.7*   Liver Function Tests: Recent Labs  Lab 01/29/24 0649 01/30/24 0900  AST 22  --   ALT 30  --   ALKPHOS 48  --   BILITOT 1.4*  --   PROT 6.9  --   ALBUMIN  2.8* 2.8*    CBC: Recent Labs  Lab 01/28/24 0345 01/28/24 0843 01/29/24 0649 01/30/24 0900  WBC 4.9 4.6 4.8 5.3  NEUTROABS 4.0  --   --   --   HGB 7.2* 7.6* 8.3* 7.3*  HCT 22.5* 24.1* 26.2* 23.2*  MCV 103.2* 104.3* 103.1* 103.6*  PLT 87* 92* 129* 132*   Studies/Results: DG Chest 2 View Result Date: 01/31/2024 CLINICAL DATA:  [id number] Follow-up exam [id number] ESRD (end stage renal disease) (HCC) [id number] Pulmonary edema 872082 EXAM: CHEST - 2 VIEW  COMPARISON:  Chest x-ray 01/28/2024, CT angiography chest 06/08/2023, chest x-ray 08/06/2022 FINDINGS: The heart and mediastinal contours are unchanged. Atherosclerotic plaque. Enlarged main pulmonary artery. No focal consolidation. Persistent pulmonary edema. Trace left pleural effusion. No pneumothorax. No acute osseous abnormality. IMPRESSION: 1. Persistent pulmonary edema. 2. Enlarged main pulmonary artery-correlate for pulmonary hypertension. 3.  Aortic Atherosclerosis (ICD10-I70.0). Electronically Signed   By: Morgane  Naveau M.D.   On: 01/31/2024 14:50    Medications:   calcitRIOL   0.75 mcg Oral Q M,W,F   Chlorhexidine  Gluconate Cloth  6 each Topical Q0600   cinacalcet   30 mg Oral Daily   feeding supplement (NEPRO CARB STEADY)  237 mL Oral BID BM   heparin   5,000 Units Subcutaneous Q8H   levETIRAcetam   1,000 mg Oral Daily   midodrine   10 mg Oral TID WC   sodium chloride  flush  3 mL Intravenous Q12H    Dialysis Orders: [facility] TTS 3.75 hours EDW 48kg 2K/2Ca bath No heparin  350/700  AVG EDW Mircera 100 mcg q 2 weeks - last dose 01/26/24 - Venofer  50 mg weekly - last dose 8/12 Calcitriol  0.75 mcg three times per week at HD Sensipar  60 mg daily   Assessment/Plan:  Acute on chronic hypoxic respiratory failure second to pulm edema: this is the second admission for this.  Compliant with HD but had not been getting to her EDW.  Should be close to EDW today.  ECHO with EF 70-75%, LVH, moderately reduced right systolic function, no valvular disease. Pulmonary edema on CXR on admit. Repeat CXR today with ongoing pulmonary edema. Plan for extra HD tonight.  ESRD:  Continue her TTS schedule while she is in the hospital. Patient is compliant with her OP HD. Extra HD tonight for pulmonary edema.   Hypertension/volume: As above, patient has been losing weight and has had her EDW adjusted, but she feels that they have not been pulling enough during HD. She has not gotten down  to EDW in a few weeks.  Her BP has been lower recently and she was just started on midodrine  by Dr. Macel. Midodrine  increased her to 10 mg TID. Beta blocker stopped d/t hypotension. Patient still making urine. Start Lasix  80 BID on MWFSun. Rx sent to pharm by previous provider.  CXR today with ongoing pulmonary edema - plan for HD tonight with UF 2-3L as tolerated. Will use albumin  for BP support. Get standing weight.  Anemia: Macrocytic anemia. B12 and folate level in goal. Patient on ESA at HD. Last ESA given 01/26/24. Hgb 7.3. Transfuse if hgb < 7 while in hospital.  Patient gets weekly Venofer  at HD. Likely need to increase outpatient ESA dose.   Metabolic bone disease: Ca and phos in goal.  Continue VDRA and Sensipar  60 mg daily. Not on binders while here. On velphoro  1 AC TID at home.    Nutrition:  Renal diet with fluid restriction.protein supplements.   Manuelita Labella, PA-C [facility] 01/31/2024,3:29 PM  LOS: 2 days

## 2024-01-31 NOTE — Progress Notes (Signed)
 [facility] PROGRESS NOTE    Progress Note  Madeline Brown  FMW:[id number] DOB: 1976/04/13 DOA: 01/28/2024 PCP: [facility] [address]     Brief Narrative:   Madeline Brown is an 48 y.o. female past medical history significant for end-stage renal disease, essential hypertension, seizure, chronic respiratory failure with hypoxia on 3 L oxygen at home does not uses it 24 hours, comes into the hospital for shortness of breath that woke her up from her sleep.  In the ED chest x-ray showed pulmonary edema   Assessment/Plan:  Acute on chronic respiratory failure with hypoxia due to flash pulmonary edema in the setting of end-stage renal disease: According to the patient she has been compliant with her dialysis treatment as an outpatient. The nephrologist been trying to lower her dry weight. Echo EF 70 %,. No WMA, No AS. This morning she is requiring 1 L of oxygen to keep saturation greater than 90%. Further management per renal. Wean to room air, OOB, IC.  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  Elevated troponins: Likely due to chronic renal disease.  End-stage renal disease on hemodialysis: Nephrology has been consulted.  Usually dialyzes Tuesdays Thursdays and Saturdays. She was dialyzed yesterday. Renal recommended to dialyze on 02/01/2024  History of ICH: Noted.  History of seizures:  continue Keppra .  Essential hypertension: Cont Midodrine . Cont Metoprolol .  Macrocytic anemia: B12 and folate are pending.  Thrombocytopenia: Back at the beginning of August platelet count was 127, this morning is 92. Continue to monitor    DVT prophylaxis: lovenoxn Family Communication:one Status is: Admit to inpatient.    Code Status:     Code Status Orders  (From admission, onward)           Start     Ordered   01/28/24 0812  Full code  Continuous       Question:   By:  Answer:  Consent: discussion documented in EHR   01/28/24 0814           Code Status History     Date Active Date Inactive Code Status Order ID Comments User Context   01/14/2024 1142 01/16/2024 2300 Full Code [id number]  Georgina Basket, MD ED   08/30/2023 1313 08/31/2023 1908 Full Code [id number]  Heddy Barren, DO ED   08/06/2022 1746 08/08/2022 2202 Full Code [id number]  Arlice Reichert, MD Inpatient   06/07/2022 2136 06/10/2022 1856 Full Code [id number]  Ricky Alfrieda DASEN, DO ED   02/02/2022 1656 02/05/2022 0401 Full Code [id number]  Cleotilde Perkins, DO ED   08/18/2021 0246 08/20/2021 1802 Full Code [id number]  Mansy, Madison LABOR, MD ED   07/29/2021 0024 07/29/2021 2226 Full Code [id number]  Ricky Alfrieda DASEN, DO ED   03/25/2021 2201 03/27/2021 1613 Full Code [id number]  Raenelle Coria, MD ED   08/07/2020 0320 08/09/2020 1714 Full Code [id number]  Lonzell Emeline HERO, DO ED   07/30/2020 1743 08/01/2020 1820 Full Code [id number]  Gerome Maurilio HERO, PA-C Inpatient   12/07/2019 2251 12/09/2019 1825 Full Code [id number]  Golda Agent, MD ED   02/19/2019 1743 02/21/2019 2118 Full Code [id number]  Arminda Daved HERO, MD ED   07/11/2018 0148 07/13/2018 1801 Full Code [id number]  Alm Maxwell LABOR, MD ED   07/09/2018 0505 07/09/2018 1604 Full Code [id number]  Franky Redia SAILOR, MD ED   06/19/2017 0253 06/27/2017 1823 Full Code [id number]  Meade Sammi SQUIBB, PA-C ED  02/13/2017 0807 02/19/2017 2005 Full Code [id number]  Luke Agent, MD ED         IV Access:   Peripheral IV   Procedures and diagnostic studies:   ECHOCARDIOGRAM COMPLETE Result Date: 01/29/2024    ECHOCARDIOGRAM REPORT   Patient Name:   Madeline Brown Date of Exam: 01/29/2024 Medical Rec #:  [id number]      Height:       60.0 in Accession #:    [id number]     Weight:       115.7 lb Date of Birth:  [birth year]       BSA:          1.479 m Patient Age:    48 years       BP:           96/74 mmHg Patient Gender: F              HR:           76 bpm. Exam Location:  Inpatient Procedure: 2D Echo,  Cardiac Doppler and Color Doppler (Both Spectral and Color            Flow Doppler were utilized during procedure). Indications:    Acute respiratory distress  History:        Patient has prior history of Echocardiogram examinations, most                 recent 02/04/2022. ESRD; Risk Factors:Hypertension.  Sonographer:    Therisa Crouch Referring Phys: LEOMA DONALDA HERO [facility] IMPRESSIONS  1. Left ventricular ejection fraction, by estimation, is 70 to 75%. The left ventricle has hyperdynamic function. The left ventricle has no regional wall motion abnormalities. There is moderate concentric left ventricular hypertrophy. Left ventricular diastolic parameters were normal.  2. Right ventricular systolic function is moderately reduced. The right ventricular size is mildly enlarged.  3. Right atrial size was mild to moderately dilated.  4. The mitral valve is normal in structure. Mild mitral valve regurgitation.  5. The aortic valve is tricuspid. Aortic valve regurgitation is not visualized. Aortic valve sclerosis/calcification is present, without any evidence of aortic stenosis.  6. The inferior vena cava is normal in size with greater than 50% respiratory variability, suggesting right atrial pressure of 3 mmHg. FINDINGS  Left Ventricle: Left ventricular ejection fraction, by estimation, is 70 to 75%. The left ventricle has hyperdynamic function. The left ventricle has no regional wall motion abnormalities. The left ventricular internal cavity size was normal in size. There is moderate concentric left ventricular hypertrophy. Left ventricular diastolic parameters were normal. Right Ventricle: The right ventricular size is mildly enlarged. Right vetricular wall thickness was not assessed. Right ventricular systolic function is moderately reduced. Left Atrium: Left atrial size was normal in size. Right Atrium: Right atrial size was mild to moderately dilated. Pericardium: Trivial pericardial effusion is present. Mitral Valve:  The mitral valve is normal in structure. Mild mitral valve regurgitation. Tricuspid Valve: The tricuspid valve is normal in structure. Tricuspid valve regurgitation is mild. Aortic Valve: The aortic valve is tricuspid. Aortic valve regurgitation is not visualized. Aortic valve sclerosis/calcification is present, without any evidence of aortic stenosis. Aortic valve mean gradient measures 5.0 mmHg. Aortic valve peak gradient measures 8.8 mmHg. Aortic valve area, by VTI measures 2.36 cm. Pulmonic Valve: The pulmonic valve was normal in structure. Pulmonic valve regurgitation is mild to moderate. Aorta: The aortic root is normal in size and structure. Venous: The inferior vena cava is normal in size  with greater than 50% respiratory variability, suggesting right atrial pressure of 3 mmHg. IAS/Shunts: No atrial level shunt detected by color flow Doppler.  LEFT VENTRICLE PLAX 2D LVIDd:         3.60 cm   Diastology LVIDs:         2.00 cm   LV e' medial:    5.87 cm/s LV PW:         1.30 cm   LV E/e' medial:  7.9 LV IVS:        1.50 cm   LV e' lateral:   10.00 cm/s LVOT diam:     2.00 cm   LV E/e' lateral: 4.7 LV SV:         58 LV SV Index:   40 LVOT Area:     3.14 cm  RIGHT VENTRICLE          IVC RV Basal diam:  4.60 cm  IVC diam: 2.00 cm RV Mid diam:    3.70 cm RVOT diam:      3.20 cm LEFT ATRIUM             Index        RIGHT ATRIUM           Index LA diam:        2.70 cm 1.83 cm/m   RA Area:     20.10 cm LA Vol (A2C):   35.3 ml 23.86 ml/m  RA Volume:   57.00 ml  38.53 ml/m LA Vol (A4C):   42.1 ml 28.46 ml/m LA Biplane Vol: 40.9 ml 27.65 ml/m  AORTIC VALVE AV Area (Vmax):    2.33 cm AV Area (Vmean):   2.39 cm AV Area (VTI):     2.36 cm AV Vmax:           148.00 cm/s AV Vmean:          97.000 cm/s AV VTI:            0.248 m AV Peak Grad:      8.8 mmHg AV Mean Grad:      5.0 mmHg LVOT Vmax:         110.00 cm/s LVOT Vmean:        73.900 cm/s LVOT VTI:          0.186 m LVOT/AV VTI ratio: 0.75  AORTA Ao Root diam:  3.10 cm MITRAL VALVE MV Area (PHT): 4.31 cm    SHUNTS MV Decel Time: 176 msec    Systemic VTI:  0.19 m MV E velocity: 46.60 cm/s  Systemic Diam: 2.00 cm MV A velocity: 67.60 cm/s  Pulmonic Diam: 3.20 cm MV E/A ratio:  0.69 Madeline Gull MD Electronically signed by Madeline Gull MD Signature Date/Time: 01/29/2024/3:21:13 PM    Final      Medical Consultants:   None.   Subjective:    Madeline Brown she was breathing significantly improved.  Objective:    Vitals:   01/30/24 1619 01/30/24 1636 01/30/24 1954 01/31/24 0436  BP:  92/73 113/80 95/72  Pulse:  85 78 85  Resp:  18 16 16   Temp:  98 F (36.7 C) 98.5 F (36.9 C) 98.6 F (37 C)  TempSrc:   Oral Oral  SpO2: 95% 97% 95% 92%  Weight:      Height:       SpO2: 92 % O2 Flow Rate (L/min): 1 L/min FiO2 (%): 97 %   Intake/Output Summary (Last 24 hours) at 01/31/2024 0749 Last  data filed at 01/31/2024 0447 Gross per 24 hour  Intake --  Output 2400 ml  Net -2400 ml   Filed Weights   01/28/24 1638 01/30/24 0807 01/30/24 1201  Weight: 52.5 kg 52.7 kg 51.9 kg    Exam: General exam: In no acute distress. Respiratory system: Lungs are clear. Cardiovascular system: S1 & S2 heard, RRR. No JVD. Gastrointestinal system: Abdomen is nondistended, soft and nontender.  Extremities: No pedal edema. Skin: No rashes, lesions or ulcers Psychiatry: Judgement and insight appear normal. Mood & affect appropriate.  Data Reviewed:    Labs: Basic Metabolic Panel: Recent Labs  Lab 01/28/24 0345 01/28/24 0843 01/29/24 0649 01/30/24 0900  NA 134*  --  136 132*  K 4.7  --  4.5 4.2  CL 94*  --  96* 93*  CO2 27  --  24 25  GLUCOSE 90  --  119* 105*  BUN 64*  --  50* 75*  CREATININE 8.52* 9.13* 6.66* 9.32*  CALCIUM  9.1  --  9.1 8.9  PHOS  --   --   --  4.7*   GFR Estimated Creatinine Clearance: 5.4 mL/min (A) (by C-G formula based on SCr of 9.32 mg/dL (H)). Liver Function Tests: Recent Labs  Lab 01/29/24 0649 01/30/24 0900  AST  22  --   ALT 30  --   ALKPHOS 48  --   BILITOT 1.4*  --   PROT 6.9  --   ALBUMIN  2.8* 2.8*   No results for input(s): LIPASE, AMYLASE in the last 168 hours. No results for input(s): AMMONIA in the last 168 hours. Coagulation profile No results for input(s): INR, PROTIME in the last 168 hours. COVID-19 Labs  No results for input(s): DDIMER, FERRITIN, LDH, CRP in the last 72 hours.  Lab Results  Component Value Date   SARSCOV2NAA NEGATIVE 01/28/2024   SARSCOV2NAA NEGATIVE 08/30/2023   SARSCOV2NAA NEGATIVE 06/07/2022   SARSCOV2NAA NEGATIVE 02/02/2022    CBC: Recent Labs  Lab 01/28/24 0345 01/28/24 0843 01/29/24 0649 01/30/24 0900  WBC 4.9 4.6 4.8 5.3  NEUTROABS 4.0  --   --   --   HGB 7.2* 7.6* 8.3* 7.3*  HCT 22.5* 24.1* 26.2* 23.2*  MCV 103.2* 104.3* 103.1* 103.6*  PLT 87* 92* 129* 132*   Cardiac Enzymes: No results for input(s): CKTOTAL, CKMB, CKMBINDEX, TROPONINI in the last 168 hours. BNP (last 3 results) No results for input(s): PROBNP in the last 8760 hours. CBG: No results for input(s): GLUCAP in the last 168 hours. D-Dimer: No results for input(s): DDIMER in the last 72 hours. Hgb A1c: No results for input(s): HGBA1C in the last 72 hours. Lipid Profile: No results for input(s): CHOL, HDL, LDLCALC, TRIG, CHOLHDL, LDLDIRECT in the last 72 hours. Thyroid  function studies: No results for input(s): TSH, T4TOTAL, T3FREE, THYROIDAB in the last 72 hours.  Invalid input(s): FREET3 Anemia work up: Recent Labs    01/29/24 0649  VITAMINB12 913  FOLATE 14.4   Sepsis Labs: Recent Labs  Lab 01/28/24 0345 01/28/24 0843 01/29/24 0649 01/30/24 0900  WBC 4.9 4.6 4.8 5.3   Microbiology Recent Results (from the past 240 hours)  Resp panel by RT-PCR (RSV, Flu A&B, Covid) Anterior Nasal Swab     Status: None   Collection Time: 01/28/24  1:45 AM   Specimen: Anterior Nasal Swab  Result Value Ref Range Status    SARS Coronavirus 2 by RT PCR NEGATIVE NEGATIVE Final   Influenza A by PCR NEGATIVE NEGATIVE Final  Influenza B by PCR NEGATIVE NEGATIVE Final    Comment: (NOTE) The Xpert Xpress SARS-CoV-2/FLU/RSV plus assay is intended as an aid in the diagnosis of influenza from Nasopharyngeal swab specimens and should not be used as a sole basis for treatment. Nasal washings and aspirates are unacceptable for Xpert Xpress SARS-CoV-2/FLU/RSV testing.  Fact Sheet for Patients: BloggerCourse.com  Fact Sheet for Healthcare Providers: SeriousBroker.it  This test is not yet approved or cleared by the [place]  FDA and has been authorized for detection and/or diagnosis of SARS-CoV-2 by FDA under an Emergency Use Authorization (EUA). This EUA will remain in effect (meaning this test can be used) for the duration of the COVID-19 declaration under Section 564(b)(1) of the Act, 21 U.S.C. section 360bbb-3(b)(1), unless the authorization is terminated or revoked.     Resp Syncytial Virus by PCR NEGATIVE NEGATIVE Final    Comment: (NOTE) Fact Sheet for Patients: BloggerCourse.com  Fact Sheet for Healthcare Providers: SeriousBroker.it  This test is not yet approved or cleared by the [place]  FDA and has been authorized for detection and/or diagnosis of SARS-CoV-2 by FDA under an Emergency Use Authorization (EUA). This EUA will remain in effect (meaning this test can be used) for the duration of the COVID-19 declaration under Section 564(b)(1) of the Act, 21 U.S.C. section 360bbb-3(b)(1), unless the authorization is terminated or revoked.  Performed at [facility], [address], [place], [place] [address]   MRSA Next Gen by PCR, Nasal     Status: None   Collection Time: 01/28/24  5:18 PM   Specimen: Nasal Mucosa; Nasal Swab  Result Value Ref Range Status   MRSA by PCR Next Gen NOT  DETECTED NOT DETECTED Final    Comment: (NOTE) The GeneXpert MRSA Assay (FDA approved for NASAL specimens only), is one component of a comprehensive MRSA colonization surveillance program. It is not intended to diagnose MRSA infection nor to guide or monitor treatment for MRSA infections. Test performance is not FDA approved in patients less than 6 years old. Performed at [facility], [address] [place], [place] [address]      Medications:    calcitRIOL   0.75 mcg Oral Q M,W,F   Chlorhexidine  Gluconate Cloth  6 each Topical Q0600   cinacalcet   30 mg Oral Daily   feeding supplement (NEPRO CARB STEADY)  237 mL Oral BID BM   heparin   5,000 Units Subcutaneous Q8H   levETIRAcetam   1,000 mg Oral Daily   midodrine   10 mg Oral TID WC   sodium chloride  flush  3 mL Intravenous Q12H   Continuous Infusions:      LOS: 2 days   Erle Odell Castor  [facility]  01/31/2024, 7:49 AM

## 2024-01-31 NOTE — Plan of Care (Signed)
  Problem: Health Behavior/Discharge Planning: Goal: Ability to manage health-related needs will improve Outcome: Progressing   Problem: Nutritional: Goal: Ability to make healthy dietary choices will improve Outcome: Progressing   Problem: Clinical Measurements: Goal: Complications related to the disease process, condition or treatment will be avoided or minimized Outcome: Progressing   Problem: Health Behavior/Discharge Planning: Goal: Ability to manage health-related needs will improve Outcome: Progressing   Problem: Clinical Measurements: Goal: Ability to maintain clinical measurements within normal limits will improve Outcome: Progressing Goal: Will remain free from infection Outcome: Progressing Goal: Diagnostic test results will improve Outcome: Progressing Goal: Respiratory complications will improve Outcome: Progressing Goal: Cardiovascular complication will be avoided Outcome: Progressing   Problem: Activity: Goal: Risk for activity intolerance will decrease Outcome: Progressing   Problem: Nutrition: Goal: Adequate nutrition will be maintained Outcome: Progressing   Problem: Coping: Goal: Level of anxiety will decrease Outcome: Progressing   Weaned to 1 liter. CXR reviewed.

## 2024-02-01 ENCOUNTER — Inpatient Hospital Stay (HOSPITAL_COMMUNITY)

## 2024-02-01 ENCOUNTER — Inpatient Hospital Stay: Admitting: Hematology

## 2024-02-01 ENCOUNTER — Inpatient Hospital Stay

## 2024-02-01 DIAGNOSIS — R002 Palpitations: Secondary | ICD-10-CM

## 2024-02-01 DIAGNOSIS — D696 Thrombocytopenia, unspecified: Secondary | ICD-10-CM | POA: Diagnosis not present

## 2024-02-01 DIAGNOSIS — D649 Anemia, unspecified: Secondary | ICD-10-CM

## 2024-02-01 DIAGNOSIS — J9621 Acute and chronic respiratory failure with hypoxia: Secondary | ICD-10-CM | POA: Diagnosis not present

## 2024-02-01 DIAGNOSIS — Z87891 Personal history of nicotine dependence: Secondary | ICD-10-CM

## 2024-02-01 DIAGNOSIS — J810 Acute pulmonary edema: Secondary | ICD-10-CM | POA: Diagnosis not present

## 2024-02-01 DIAGNOSIS — Z9981 Dependence on supplemental oxygen: Secondary | ICD-10-CM

## 2024-02-01 LAB — HEPATIC FUNCTION PANEL
ALT: 50 U/L — ABNORMAL HIGH (ref 0–44)
AST: 52 U/L — ABNORMAL HIGH (ref 15–41)
Albumin: 4.2 g/dL (ref 3.5–5.0)
Alkaline Phosphatase: 54 U/L (ref 38–126)
Bilirubin, Direct: 0.2 mg/dL (ref 0.0–0.2)
Indirect Bilirubin: 1.2 mg/dL — ABNORMAL HIGH (ref 0.3–0.9)
Total Bilirubin: 1.4 mg/dL — ABNORMAL HIGH (ref 0.0–1.2)
Total Protein: 8.9 g/dL — ABNORMAL HIGH (ref 6.5–8.1)

## 2024-02-01 LAB — RETIC PANEL
Immature Retic Fract: 48.2 % — ABNORMAL HIGH (ref 2.3–15.9)
RBC.: 3.02 MIL/uL — ABNORMAL LOW (ref 3.87–5.11)
Retic Count, Absolute: 181.8 K/uL (ref 19.0–186.0)
Retic Ct Pct: 6 % — ABNORMAL HIGH (ref 0.4–3.1)
Reticulocyte Hemoglobin: 36.4 pg (ref >27.9)

## 2024-02-01 LAB — IMMATURE PLATELET FRACTION: Immature Platelet Fraction: 5.1 % (ref 1.2–8.6)

## 2024-02-01 LAB — LACTATE DEHYDROGENASE: LDH: 206 U/L — ABNORMAL HIGH (ref 98–192)

## 2024-02-01 MED ORDER — CYCLOBENZAPRINE HCL 5 MG PO TABS
5.0000 mg | ORAL_TABLET | Freq: Every evening | ORAL | Status: DC | PRN
  Administered 2024-02-01: 5 mg via ORAL
  Filled 2024-02-01: qty 1

## 2024-02-01 MED ORDER — ALBUMIN HUMAN 25 % IV SOLN
12.5000 g | Freq: Once | INTRAVENOUS | Status: AC
  Administered 2024-02-01: 12.5 g via INTRAVENOUS

## 2024-02-01 MED ORDER — ALBUMIN HUMAN 25 % IV SOLN
INTRAVENOUS | Status: AC
  Filled 2024-02-01: qty 100

## 2024-02-01 MED ORDER — SIMETHICONE 80 MG PO CHEW: 80.0000 mg | CHEWABLE_TABLET | Freq: Four times a day (QID) | ORAL | Status: DC | PRN

## 2024-02-01 MED ORDER — PROCHLORPERAZINE EDISYLATE 10 MG/2ML IJ SOLN
10.0000 mg | Freq: Four times a day (QID) | INTRAMUSCULAR | Status: DC | PRN
  Administered 2024-02-02: 10 mg via INTRAVENOUS
  Filled 2024-02-01: qty 2

## 2024-02-01 NOTE — Progress Notes (Signed)
 [facility] PROGRESS NOTE    Progress Note  Madeline Brown  FMW:[id number] DOB: Apr 24, 1976 DOA: 01/28/2024 PCP: [facility] [address]     Brief Narrative:   Madeline Brown is an 48 y.o. female past medical history significant for end-stage renal disease, essential hypertension, seizure, chronic respiratory failure with hypoxia on 3 L oxygen at home does not uses it 24 hours, comes into the hospital for shortness of breath that woke her up from her sleep.  In the ED chest x-ray showed pulmonary edema.  Assessment/Plan:  Acute on chronic respiratory failure with hypoxia due to flash pulmonary edema in the setting of end-stage renal disease: According to the patient she has been compliant with her dialysis treatment as an outpatient. The nephrologist been trying to lower her dry weight. Her oxygen requirements are new on this admission. She was dialyzed overnight she is now on 1 L oxygen satting greater 90%. Further management per renal. Weaned to room air, out of bed to chair.  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  Elevated troponins: Likely due to chronic renal disease.  End-stage renal disease on hemodialysis: Nephrology has been consulted.  Usually dialyzes Tuesdays Thursdays and Saturdays. She was dialyzed again overnight.. Renal recommended to dialyze on 02/01/2024  History of ICH: Noted.  History of seizures:  continue Keppra .  Essential hypertension: Cont Midodrine . Cont Metoprolol .  Macrocytic anemia: B12 and folate are pending.  Thrombocytopenia: Back at the beginning of August platelet count was 127, this morning is 92. Continue to monitor    DVT prophylaxis: lovenoxn Family Communication:one Status is: Admit to inpatient.    Code Status:     Code Status Orders  (From admission, onward)           Start     Ordered   01/28/24 0812  Full  code  Continuous       Question:  By:  Answer:  Consent: discussion documented in EHR   01/28/24 0814           Code Status History     Date Active Date Inactive Code Status Order ID Comments User Context   01/14/2024 1142 01/16/2024 2300 Full Code [id number]  Georgina Basket, MD ED   08/30/2023 1313 08/31/2023 1908 Full Code [id number]  Heddy Barren, DO ED   08/06/2022 1746 08/08/2022 2202 Full Code [id number]  Arlice Reichert, MD Inpatient   06/07/2022 2136 06/10/2022 1856 Full Code [id number]  Ricky Alfrieda DASEN, DO ED   02/02/2022 1656 02/05/2022 0401 Full Code [id number]  Cleotilde Perkins, DO ED   08/18/2021 0246 08/20/2021 1802 Full Code [id number]  Mansy, Madison LABOR, MD ED   07/29/2021 0024 07/29/2021 2226 Full Code [id number]  Ricky Alfrieda DASEN, DO ED   03/25/2021 2201 03/27/2021 1613 Full Code [id number]  Raenelle Coria, MD ED   08/07/2020 0320 08/09/2020 1714 Full Code [id number]  Lonzell Emeline HERO, DO ED   07/30/2020 1743 08/01/2020 1820 Full Code [id number]  Gerome Maurilio HERO, PA-C Inpatient   12/07/2019 2251 12/09/2019 1825 Full Code [id number]  Golda Agent, MD ED   02/19/2019 1743 02/21/2019 2118 Full Code [id number]  Arminda Daved HERO, MD ED   07/11/2018 0148 07/13/2018 1801 Full Code [id number]  Alm Maxwell LABOR, MD ED   07/09/2018 0505 07/09/2018 1604 Full Code [id number]  Franky Redia SAILOR, MD ED   06/19/2017 0253 06/27/2017 1823 Full Code [id number]  Meade Sammi SQUIBB, PA-C ED  02/13/2017 0807 02/19/2017 2005 Full Code [id number]  Luke Agent, MD ED         IV Access:   Peripheral IV   Procedures and diagnostic studies:   DG Chest 2 View Result Date: 01/31/2024 CLINICAL DATA:  [id number] Follow-up exam [id number] ESRD (end stage renal disease) [facility]) [id number] Pulmonary edema [id number] EXAM: CHEST - 2 VIEW COMPARISON:  Chest x-ray 01/28/2024, CT angiography chest 06/08/2023, chest x-ray 08/06/2022 FINDINGS: The heart and mediastinal contours are unchanged. Atherosclerotic plaque. Enlarged main pulmonary artery. No focal  consolidation. Persistent pulmonary edema. Trace left pleural effusion. No pneumothorax. No acute osseous abnormality. IMPRESSION: 1. Persistent pulmonary edema. 2. Enlarged main pulmonary artery-correlate for pulmonary hypertension. 3.  Aortic Atherosclerosis (ICD10-I70.0). Electronically Signed   By: Morgane  Naveau M.D.   On: 01/31/2024 14:50     Medical Consultants:   None.   Subjective:    Madeline Brown she relates her breathing is improved.  Objective:    Vitals:   02/01/24 0203 02/01/24 0230 02/01/24 0233 02/01/24 0335  BP: 112/75 96/62 113/84 91/67  Pulse: 89 85 79 89  Resp: 17 20 19 17   Temp:   98.2 F (36.8 C) 97.7 F (36.5 C)  TempSrc:   Oral Oral  SpO2: 100% 100% 100% 95%  Weight:      Height:       SpO2: 95 % O2 Flow Rate (L/min): 2 L/min FiO2 (%): 85 %   Intake/Output Summary (Last 24 hours) at 02/01/2024 0700 Last data filed at 02/01/2024 0506 Gross per 24 hour  Intake 370 ml  Output 1400 ml  Net -1030 ml   Filed Weights   01/30/24 1201 01/31/24 1554 01/31/24 2333  Weight: 51.9 kg 49.7 kg 50.7 kg    Exam: General exam: In no acute distress. Respiratory system: Good air movement lungs are clear to auscultation Cardiovascular system: S1 & S2 heard, RRR. No JVD. Gastrointestinal system: Abdomen is nondistended, soft and nontender.  Extremities: No pedal edema. Skin: No rashes, lesions or ulcers Psychiatry: Judgement and insight appear normal. Mood & affect appropriate.  Data Reviewed:    Labs: Basic Metabolic Panel: Recent Labs  Lab 01/28/24 0345 01/28/24 0843 01/29/24 0649 01/30/24 0900  NA 134*  --  136 132*  K 4.7  --  4.5 4.2  CL 94*  --  96* 93*  CO2 27  --  24 25  GLUCOSE 90  --  119* 105*  BUN 64*  --  50* 75*  CREATININE 8.52* 9.13* 6.66* 9.32*  CALCIUM  9.1  --  9.1 8.9  PHOS  --   --   --  4.7*   GFR Estimated Creatinine Clearance: 5.4 mL/min (A) (by C-G formula based on SCr of 9.32 mg/dL (H)). Liver Function  Tests: Recent Labs  Lab 01/29/24 0649 01/30/24 0900  AST 22  --   ALT 30  --   ALKPHOS 48  --   BILITOT 1.4*  --   PROT 6.9  --   ALBUMIN  2.8* 2.8*   No results for input(s): LIPASE, AMYLASE in the last 168 hours. No results for input(s): AMMONIA in the last 168 hours. Coagulation profile No results for input(s): INR, PROTIME in the last 168 hours. COVID-19 Labs  No results for input(s): DDIMER, FERRITIN, LDH, CRP in the last 72 hours.  Lab Results  Component Value Date   SARSCOV2NAA NEGATIVE 01/28/2024   SARSCOV2NAA NEGATIVE 08/30/2023   SARSCOV2NAA NEGATIVE 06/07/2022   SARSCOV2NAA NEGATIVE 02/02/2022  CBC: Recent Labs  Lab 01/28/24 0345 01/28/24 0843 01/29/24 0649 01/30/24 0900  WBC 4.9 4.6 4.8 5.3  NEUTROABS 4.0  --   --   --   HGB 7.2* 7.6* 8.3* 7.3*  HCT 22.5* 24.1* 26.2* 23.2*  MCV 103.2* 104.3* 103.1* 103.6*  PLT 87* 92* 129* 132*   Cardiac Enzymes: No results for input(s): CKTOTAL, CKMB, CKMBINDEX, TROPONINI in the last 168 hours. BNP (last 3 results) No results for input(s): PROBNP in the last 8760 hours. CBG: No results for input(s): GLUCAP in the last 168 hours. D-Dimer: No results for input(s): DDIMER in the last 72 hours. Hgb A1c: No results for input(s): HGBA1C in the last 72 hours. Lipid Profile: No results for input(s): CHOL, HDL, LDLCALC, TRIG, CHOLHDL, LDLDIRECT in the last 72 hours. Thyroid  function studies: No results for input(s): TSH, T4TOTAL, T3FREE, THYROIDAB in the last 72 hours.  Invalid input(s): FREET3 Anemia work up: No results for input(s): VITAMINB12, FOLATE, FERRITIN, TIBC, IRON , RETICCTPCT in the last 72 hours.  Sepsis Labs: Recent Labs  Lab 01/28/24 0345 01/28/24 0843 01/29/24 0649 01/30/24 0900  WBC 4.9 4.6 4.8 5.3   Microbiology Recent Results (from the past 240 hours)  Resp panel by RT-PCR (RSV, Flu A&B, Covid) Anterior Nasal Swab      Status: None   Collection Time: 01/28/24  1:45 AM   Specimen: Anterior Nasal Swab  Result Value Ref Range Status   SARS Coronavirus 2 by RT PCR NEGATIVE NEGATIVE Final   Influenza A by PCR NEGATIVE NEGATIVE Final   Influenza B by PCR NEGATIVE NEGATIVE Final    Comment: (NOTE) The Xpert Xpress SARS-CoV-2/FLU/RSV plus assay is intended as an aid in the diagnosis of influenza from Nasopharyngeal swab specimens and should not be used as a sole basis for treatment. Nasal washings and aspirates are unacceptable for Xpert Xpress SARS-CoV-2/FLU/RSV testing.  Fact Sheet for Patients: BloggerCourse.com  Fact Sheet for Healthcare Providers: SeriousBroker.it  This test is not yet approved or cleared by the [place]  FDA and has been authorized for detection and/or diagnosis of SARS-CoV-2 by FDA under an Emergency Use Authorization (EUA). This EUA will remain in effect (meaning this test can be used) for the duration of the COVID-19 declaration under Section 564(b)(1) of the Act, 21 U.S.C. section 360bbb-3(b)(1), unless the authorization is terminated or revoked.     Resp Syncytial Virus by PCR NEGATIVE NEGATIVE Final    Comment: (NOTE) Fact Sheet for Patients: BloggerCourse.com  Fact Sheet for Healthcare Providers: SeriousBroker.it  This test is not yet approved or cleared by the [place]  FDA and has been authorized for detection and/or diagnosis of SARS-CoV-2 by FDA under an Emergency Use Authorization (EUA). This EUA will remain in effect (meaning this test can be used) for the duration of the COVID-19 declaration under Section 564(b)(1) of the Act, 21 U.S.C. section 360bbb-3(b)(1), unless the authorization is terminated or revoked.  Performed at [facility], [address], [place], [place] [address]   MRSA Next Gen by PCR, Nasal     Status: None   Collection Time:  01/28/24  5:18 PM   Specimen: Nasal Mucosa; Nasal Swab  Result Value Ref Range Status   MRSA by PCR Next Gen NOT DETECTED NOT DETECTED Final    Comment: (NOTE) The GeneXpert MRSA Assay (FDA approved for NASAL specimens only), is one component of a comprehensive MRSA colonization surveillance program. It is not intended to diagnose MRSA infection nor to guide or monitor treatment  for MRSA infections. Test performance is not FDA approved in patients less than 40 years old. Performed at [facility], [address] [place], [place] [address]      Medications:    calcitRIOL   0.75 mcg Oral Q M,W,F   Chlorhexidine  Gluconate Cloth  6 each Topical Q0600   cinacalcet   30 mg Oral Daily   feeding supplement (NEPRO CARB STEADY)  237 mL Oral BID BM   heparin   5,000 Units Subcutaneous Q8H   levETIRAcetam   1,000 mg Oral Daily   midodrine   10 mg Oral TID WC   sodium chloride  flush  3 mL Intravenous Q12H   Continuous Infusions:      LOS: 3 days   Madeline Brown  [facility]  02/01/2024, 7:00 AM

## 2024-02-01 NOTE — Plan of Care (Signed)
  Problem: Health Behavior/Discharge Planning: Goal: Ability to manage health-related needs will improve Outcome: Progressing   Problem: Nutritional: Goal: Ability to make healthy dietary choices will improve Outcome: Progressing   Problem: Clinical Measurements: Goal: Complications related to the disease process, condition or treatment will be avoided or minimized Outcome: Progressing   Problem: Health Behavior/Discharge Planning: Goal: Ability to manage health-related needs will improve Outcome: Progressing   Problem: Clinical Measurements: Goal: Ability to maintain clinical measurements within normal limits will improve Outcome: Progressing Goal: Will remain free from infection Outcome: Progressing Goal: Diagnostic test results will improve Outcome: Progressing Goal: Respiratory complications will improve Outcome: Progressing Goal: Cardiovascular complication will be avoided Outcome: Progressing   Problem: Activity: Goal: Risk for activity intolerance will decrease Outcome: Progressing   Problem: Nutrition: Goal: Adequate nutrition will be maintained Outcome: Progressing   Problem: Coping: Goal: Level of anxiety will decrease Outcome: Progressing   Problem: Elimination: Goal: Will not experience complications related to bowel motility Outcome: Progressing Goal: Will not experience complications related to urinary retention Outcome: Progressing   Problem: Pain Managment: Goal: General experience of comfort will improve and/or be controlled Outcome: Progressing   Problem: Safety: Goal: Ability to remain free from injury will improve Outcome: Progressing   Problem: Skin Integrity: Goal: Risk for impaired skin integrity will decrease Outcome: Progressing

## 2024-02-01 NOTE — Progress Notes (Addendum)
  [facility] Progress Note   Subjective:   Seen in room. Had extra HD overnight - only tolerated 1.4 UF, became hypotensive with cramping during treatment. Remains on 2L [place] this am.   Objective Vitals:   02/01/24 0203 02/01/24 0230 02/01/24 0233 02/01/24 0335  BP: 112/75 96/62 113/84 91/67  Pulse: 89 85 79 89  Resp: 17 20 19 17   Temp:   98.2 F (36.8 C) 97.7 F (36.5 C)  TempSrc:   Oral Oral  SpO2: 100% 100% 100% 95%  Weight:      Height:       Physical Exam General: alert, nad, on nasal oxygen  Heart: RRR  Lungs: Clear, bilaterally  Abdomen: non tender  Extremities: no LE edema Dialysis Access: LU AVG +b   Filed Weights   01/30/24 1201 01/31/24 1554 01/31/24 2333  Weight: 51.9 kg 49.7 kg 50.7 kg    Intake/Output Summary (Last 24 hours) at 02/01/2024 1113 Last data filed at 02/01/2024 0506 Gross per 24 hour  Intake 320 ml  Output 1400 ml  Net -1080 ml    Additional Objective Labs: Basic Metabolic Panel: Recent Labs  Lab 01/28/24 0345 01/28/24 0843 01/29/24 0649 01/30/24 0900  NA 134*  --  136 132*  K 4.7  --  4.5 4.2  CL 94*  --  96* 93*  CO2 27  --  24 25  GLUCOSE 90  --  119* 105*  BUN 64*  --  50* 75*  CREATININE 8.52* 9.13* 6.66* 9.32*  CALCIUM  9.1  --  9.1 8.9  PHOS  --   --   --  4.7*   Liver Function Tests: Recent Labs  Lab 01/29/24 0649 01/30/24 0900  AST 22  --   ALT 30  --   ALKPHOS 48  --   BILITOT 1.4*  --   PROT 6.9  --   ALBUMIN  2.8* 2.8*    CBC: Recent Labs  Lab 01/28/24 0345 01/28/24 0843 01/29/24 0649 01/30/24 0900  WBC 4.9 4.6 4.8 5.3  NEUTROABS 4.0  --   --   --   HGB 7.2* 7.6* 8.3* 7.3*  HCT 22.5* 24.1* 26.2* 23.2*  MCV 103.2* 104.3* 103.1* 103.6*  PLT 87* 92* 129* 132*   Studies/Results: DG Chest 2 View Result Date: 01/31/2024 CLINICAL DATA:  [id number] Follow-up exam [id number] ESRD (end stage renal disease) (HCC) [id number] Pulmonary edema [id number] EXAM: CHEST - 2 VIEW COMPARISON:  Chest x-ray  01/28/2024, CT angiography chest 06/08/2023, chest x-ray 08/06/2022 FINDINGS: The heart and mediastinal contours are unchanged. Atherosclerotic plaque. Enlarged main pulmonary artery. No focal consolidation. Persistent pulmonary edema. Trace left pleural effusion. No pneumothorax. No acute osseous abnormality. IMPRESSION: 1. Persistent pulmonary edema. 2. Enlarged main pulmonary artery-correlate for pulmonary hypertension. 3.  Aortic Atherosclerosis (ICD10-I70.0). Electronically Signed   By: Morgane  Naveau M.D.   On: 01/31/2024 14:50    Medications:   calcitRIOL   0.75 mcg Oral Q M,W,F   Chlorhexidine  Gluconate Cloth  6 each Topical Q0600   cinacalcet   30 mg Oral Daily   feeding supplement (NEPRO CARB STEADY)  237 mL Oral BID BM   heparin   5,000 Units Subcutaneous Q8H   levETIRAcetam   1,000 mg Oral Daily   midodrine   10 mg Oral TID WC   sodium chloride  flush  3 mL Intravenous Q12H    Dialysis Orders: [facility] TTS 3.75 hours EDW 48kg 2K/2Ca bath No heparin  350/700 AVG EDW Mircera 100 mcg q 2  weeks - last dose 01/26/24 - Venofer  50 mg weekly - last dose 8/12 Calcitriol  0.75 mcg three times per week at HD Sensipar  60 mg daily   Assessment/Plan: Acute on chronic hypoxic respiratory failure. Pulmonary edema on admission.  Echo with EF 70-75%, LVH, moderately reduced right systolic function, no valvular disease. Outpatient dry weight has already  been lowered. Repeat CXR  8/17 with persistent pulm edema. Had dialysis overnight for additional volume removal but didn't tolerate UF well. Lungs clear on exam today. Unable to tolerate UF last night. CXR yest looks normal, was over-read as edema. Will not try to pull extra fluid from her any further, this is as dry as we can get her. Has home O2 she tells us .  ESRD:  HD TTS. Next HD Tues.  Hypotension with HD.  On midodrine  10mg  TID for BP support.  Volume: Believe she is now euvolemic. Didn't tolerate UF with HD on Sunday.  EDW 48 kg. -hasn't been able to reach if weights are accurate.   Anemia: Macrocytic anemia. B12 and folate level in goal. Patient on ESA at HD. Last ESA given 01/26/24. Hgb 7.3. Transfuse if hgb < 7 while in hospital.  Patient gets weekly Venofer  at HD. Likely need to increase outpatient ESA dose.   Metabolic bone disease: Ca and phos in goal.  Continue VDRA and Sensipar  60 mg daily. Not on binders while here. On velphoro  1 AC TID at home.    Nutrition:  Renal diet with fluid restriction.protein supplements.   Maisie Ronnald Acosta PA-C [facility] 02/01/2024,11:19 AM  Pt seen, examined and agree w assess/plan as above with additions as indicated.  Rob [facility] 02/01/2024, 3:28 PM

## 2024-02-01 NOTE — Consult Note (Addendum)
 [facility]  [phone] [phone]) [phone]    HEMATOLOGY CONSULTATION  PURPOSE OF CONSULTATION/CHIEF COMPLAINT:  Anemia and thrombocytopenia  Referring MD:  Dr. Lanny (patient had outpatient hematology appt scheduled for 02/01/24, prior to admission)   HPI: Madeline Brown is a 48 year old female patient admitted on 01/28/2024 with complaints of shortness of breath. Medical history is significant for ESRD on hemodialysis, chronic anemia, hypertension, chronic hypoxic respiratory failure on O2 at home.  Patient had an appointment with outpatient hematology clinic on 02/01/2024.  Therefore hematology consult is being done at this time. Patient is seen awake alert and oriented x 3 laying in bed.  She is a very pleasant lady who serves as principal historian.  Reports that she was feeling okay and woke up the day prior to her admission with sudden shortness of breath with chest discomfort.  She called EMS and was subsequently brought to the hospital.  Denies chest pain at this time.  Reports occasional cramping of her hands and abdomen.  States that her appetite has been very poor for the last 3 months. Surgical history includes 2 C-sections in 2010 and the other in 2015.  She has had surgery for hemodialysis access. Family history is noncontributory. Social history includes 22-year tobacco use, quit January 2024.  Denies alcohol use.  Admits to occasional marijuana use, denies other illicit drug use.  Formerly worked at [employer] with no occupational hazardous exposure.    ASSESSMENT AND PLAN:   Anemia Thrombocytopenia - Anemia likely due to to renal dysfunction - Hemoglobin on 8/16 was low 7.3. - Recommend PRBC transfusions for hemoglobin <7.0 -Platelets improving 132K on 8/16.  Noted platelets of 87K on admission 8/14. - Ordered reticulocyte count, hepatic function, LDH, haptoglobin, immature platelets.  Pending. - Pending abdominal ultrasound with focus on  liver and spleen - On ESA with HD, last given 8/12.  Recommend continuation weekly. - Noted patient is on weekly Venofer  with HD - Hematology/Dr. Lanny will make further evaluation and treatment recommendations.  Shortness of breath Acute on chronic hypoxic respiratory failure History of pulmonary edema - On O2 therapy at 2L via [place] - Monitor closely  ESRD - Patient is on hemodialysis 3 times per week - Nephrology managing    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   History of blood transfusion 07/2020   Hypertension    Hypertensive chronic kidney disease with stage 1 through stage 4 chronic kidney disease, or unspecified chronic kidney disease 06/30/2017   Intracerebral hemorrhage 02/21/2019   Intracranial hematoma (HCC) 02/20/2019   Intracranial hemorrhage (HCC)    LVH (left ventricular hypertrophy)    Pericardial effusion    Pneumonia    Pulmonary hypertension (HCC)    Seizure (HCC) 02/2019   last one was in 02/2019   Shortness of breath 07/15/2018   on 3L via [facility] prn   Stroke (HCC)    Mild was told after having a seizure   Substance abuse (HCC)    Marijuana   Thrombocytopenia (HCC)   :  Past Surgical History:  Procedure Laterality Date   AV FISTULA PLACEMENT Left 06/24/2017   Procedure: CREATION OF LEFT ARM BRACHIALCEPHALIC  ARTERIOVENOUS (AV) FISTULA;  Surgeon: Oris Krystal FALCON, MD;  Location: [facility] OR;  Service: Vascular;  Laterality: Left;   AV FISTULA PLACEMENT Right 10/20/2022   Procedure: INSERTION OF RIGHT ARM ARTERIOVENOUS (  AV) GORE-TEX GRAFT;  Surgeon: Magda Debby SAILOR, MD;  Location: [facility] OR;  Service: Vascular;  Laterality: Right;   CARDIAC CATHETERIZATION     CESAREAN SECTION     x2   COMPLEX WOUND CLOSURE Left 07/30/2020   Procedure: COMPLEX WOUND CLOSURE;  Surgeon: Magda Debby SAILOR, MD;  Location: [facility] OR;  Service: Vascular;  Laterality: Left;   FISTULA SUPERFICIALIZATION Left  10/28/2017   Procedure: SUPERFICIALIZATION LEFT BRACHIOCEPHALIC ARTERIOVENOUS FISTULA;  Surgeon: Oris Krystal FALCON, MD;  Location: [facility] OR;  Service: Vascular;  Laterality: Left;   INGUINAL HERNIA REPAIR Bilateral 02/01/2021   Procedure: LAPAROSCOPIC BILATERAL INGUINAL HERNIA REPAIR WITH MESH;  Surgeon: Kinsinger, Herlene Righter, MD;  Location: [facility] ORS;  Service: General;  Laterality: Bilateral;   INSERTION OF DIALYSIS CATHETER Right 06/24/2017   Procedure: INSERTION OF TUNNELED DIALYSIS CATHETER;  Surgeon: Oris Krystal FALCON, MD;  Location: [facility] OR;  Service: Vascular;  Laterality: Right;   INSERTION OF DIALYSIS CATHETER Right 07/30/2020   Procedure: INSERTION OF DIALYSIS CATHETER;  Surgeon: Magda Debby SAILOR, MD;  Location: [facility] OR;  Service: Vascular;  Laterality: Right;   INSERTION OF DIALYSIS CATHETER Right 08/06/2022   Procedure: INSERTION OF TUNNELED DIALYSIS CATHETER;  Surgeon: Magda Debby SAILOR, MD;  Location: [facility] OR;  Service: Vascular;  Laterality: Right;   LIGATION OF COMPETING BRANCHES OF ARTERIOVENOUS FISTULA  10/28/2017   Procedure: LIGATION OF COMPETING BRANCHES OF ARTERIOVENOUS FISTULA x3;  Surgeon: Oris Krystal FALCON, MD;  Location: [facility] OR;  Service: Vascular;;   REVISON OF ARTERIOVENOUS FISTULA Left 07/30/2020   Procedure: LEFT UPPER EXTREMITY ARTERIOVENOUS FISTULA REVISON;  Surgeon: Magda Debby SAILOR, MD;  Location: [facility] OR;  Service: Vascular;  Laterality: Left;  PERIPHERAL NERVE BLOCK   REVISON OF ARTERIOVENOUS FISTULA Left 08/06/2022   Procedure: LEFT UPPER EXTREMITY FISTULA LIGATION;  Surgeon: Magda Debby SAILOR, MD;  Location: [facility] OR;  Service: Vascular;  Laterality: Left;   RIGHT HEART CATH N/A 07/09/2022   Procedure: RIGHT HEART CATH;  Surgeon: Gardenia Led, DO;  Location: [facility] INVASIVE CV LAB;  Service: Cardiovascular;  Laterality: N/A;  :  Allergies  Allergen Reactions   Glycerin-Hypromellose-Peg 400 Swelling   Visine [Tetrahydrozoline Hcl] Swelling and Other (See Comments)    Eyes Swell   :   Family History  Problem Relation Age of Onset   Diabetes Father    Hypertension Father    Cancer Father    Heart disease Father    Aneurysm Mother    Seizures Mother   :   Social History   Socioeconomic History   Marital status: Single    Spouse name: Not on file   Number of children: Not on file   Years of education: Not on file   Highest education level: Not on file  Occupational History   Not on file  Tobacco Use   Smoking status: Former    Current packs/day: 0.25    Average packs/day: 0.3 packs/day for 29.6 years (7.4 ttl pk-yrs)    Types: Cigarettes    Start date: 1996    Passive exposure: Current   Smokeless tobacco: Never   Tobacco comments:    Cutting back to 1/4 ppd  Vaping Use   Vaping status: Never Used  Substance and Sexual Activity   Alcohol use: Not Currently    Comment: occassional beer   Drug use: Yes    Types: Marijuana    Comment: OCASSIONAL, LAST TIME 10/16/22   Sexual activity: Yes    Birth control/protection: Post-menopausal  Other  Topics Concern   Not on file  Social History Narrative   Not on file   Social Drivers of Health   Financial Resource Strain: Low Risk  (11/03/2022)   [facility]   Overall Financial Resource Strain (CARDIA)    Difficulty of Paying Living Expenses: Not hard at all  Food Insecurity: No Food Insecurity (01/28/2024)   Hunger Vital Sign    Worried About Running Out of Food in the Last Year: Never true    Ran Out of Food in the Last Year: Never true  Transportation Needs: No Transportation Needs (01/28/2024)   PRAPARE - Administrator, Civil Service (Medical): No    Lack of Transportation (Non-Medical): No  Physical Activity: Not on file  Stress: Not on file  Social Connections: Not on file  Intimate Partner Violence: Not At Risk (01/28/2024)   Humiliation, Afraid, Rape, and Kick questionnaire    Fear of Current or Ex-Partner: No    Emotionally Abused: No    Physically Abused:  No    Sexually Abused: No  :   CURRENT MEDS: Current Facility-Administered Medications  Medication Dose Route Frequency Provider Last Rate Last Admin   acetaminophen  (TYLENOL ) tablet 650 mg  650 mg Oral Q6H PRN Raenelle Donalda HERO, MD   650 mg at 02/01/24 9379   Or   acetaminophen  (TYLENOL ) suppository 650 mg  650 mg Rectal Q6H PRN Ghimire, Donalda HERO, MD       albuterol  (PROVENTIL ) (2.5 MG/3ML) 0.083% nebulizer solution 2.5 mg  2.5 mg Nebulization Q2H PRN Ghimire, Donalda HERO, MD       calcitRIOL  (ROCALTROL ) capsule 0.75 mcg  0.75 mcg Oral Q M,W,F Ghimire, Donalda HERO, MD   0.75 mcg at 02/01/24 0741   Chlorhexidine  Gluconate Cloth 2 % PADS 6 each  6 each Topical Q0600 Penninger, Lindsay, PA       cinacalcet  (SENSIPAR ) tablet 30 mg  30 mg Oral Daily Ghimire, Shanker M, MD   30 mg at 02/01/24 0741   feeding supplement (NEPRO CARB STEADY) liquid 237 mL  237 mL Oral BID BM Raenelle Donalda HERO, MD   237 mL at 02/01/24 0741   heparin  injection 5,000 Units  5,000 Units Subcutaneous Q8H Raenelle Donalda HERO, MD   5,000 Units at 02/01/24 9376   levETIRAcetam  (KEPPRA ) tablet 1,000 mg  1,000 mg Oral Daily Raenelle Donalda HERO, MD   1,000 mg at 02/01/24 0740   midodrine  (PROAMATINE ) tablet 10 mg  10 mg Oral TID WC Ghimire, Shanker M, MD   10 mg at 02/01/24 1102   ondansetron  (ZOFRAN ) tablet 4 mg  4 mg Oral Q6H PRN Raenelle Donalda HERO, MD       Or   ondansetron  (ZOFRAN ) injection 4 mg  4 mg Intravenous Q6H PRN Raenelle Donalda HERO, MD   4 mg at 02/01/24 1102   Oral care mouth rinse  15 mL Mouth Rinse PRN Ghimire, Donalda HERO, MD       senna-docusate (Senokot-S) tablet 1 tablet  1 tablet Oral QHS PRN Raenelle Donalda HERO, MD       sodium chloride  flush (NS) 0.9 % injection 3 mL  3 mL Intravenous Q12H Ghimire, Donalda HERO, MD   3 mL at 02/01/24 0735   sodium chloride  flush (NS) 0.9 % injection 3 mL  3 mL Intravenous PRN Ghimire, Donalda HERO, MD        PHYSICAL EXAMINATION: ECOG PERFORMANCE STATUS: 2 - Symptomatic, <50%  confined to bed  Vitals:   02/01/24 0233 02/01/24 0335  BP: 113/84 91/67  Pulse: 79 89  Resp: 19 17  Temp: 98.2 F (36.8 C) 97.7 F (36.5 C)  SpO2: 100% 95%   Filed Weights   01/30/24 1201 01/31/24 1554 01/31/24 2333  Weight: 114 lb 6.7 oz (51.9 kg) 109 lb 9.1 oz (49.7 kg) 111 lb 12.4 oz (50.7 kg)    GENERAL: alert, no distress and comfortable +right-sided AV fistula intact SKIN: skin color, texture, turgor are normal, no rashes or significant lesions EYES: normal, conjunctiva are pink and non-injected, sclera clear OROPHARYNX: no exudate, no erythema and lips, buccal mucosa, and tongue normal  NECK: supple, thyroid  normal size, non-tender, without nodularity LYMPH: no palpable lymphadenopathy in the cervical, axillary or inguinal LUNGS: clear to auscultation and percussion with normal breathing effort HEART: regular rate & rhythm and no murmurs and no lower extremity edema ABDOMEN: abdomen soft, non-tender and normal bowel sounds MUSCULOSKELETAL: no cyanosis of digits and no clubbing  PSYCH: alert & oriented x 3 with fluent speech NEURO: no focal motor/sensory deficits   LABS: Lab Results  Component Value Date   WBC 5.3 01/30/2024   HGB 7.3 (L) 01/30/2024   HCT 23.2 (L) 01/30/2024   MCV 103.6 (H) 01/30/2024   PLT 132 (L) 01/30/2024    Lab Results  Component Value Date   WBC 5.3 01/30/2024   HGB 7.3 (L) 01/30/2024   HCT 23.2 (L) 01/30/2024   PLT 132 (L) 01/30/2024   GLUCOSE 105 (H) 01/30/2024   CHOL 172 03/23/2019   TRIG 55 03/23/2019   HDL 93 03/23/2019   LDLCALC 68 03/23/2019   ALT 30 01/29/2024   AST 22 01/29/2024   NA 132 (L) 01/30/2024   K 4.2 01/30/2024   CL 93 (L) 01/30/2024   CREATININE 9.32 (H) 01/30/2024   BUN 75 (H) 01/30/2024   CO2 25 01/30/2024   INR 1.14 06/24/2017   HGBA1C 4.3 (L) 02/21/2019    DG Chest 2 View Result Date: 01/31/2024 CLINICAL DATA:  [id number] Follow-up exam [id number] ESRD (end stage renal disease) [facility]) [id number]  Pulmonary edema [id number] EXAM: CHEST - 2 VIEW COMPARISON:  Chest x-ray 01/28/2024, CT angiography chest 06/08/2023, chest x-ray 08/06/2022 FINDINGS: The heart and mediastinal contours are unchanged. Atherosclerotic plaque. Enlarged main pulmonary artery. No focal consolidation. Persistent pulmonary edema. Trace left pleural effusion. No pneumothorax. No acute osseous abnormality. IMPRESSION: 1. Persistent pulmonary edema. 2. Enlarged main pulmonary artery-correlate for pulmonary hypertension. 3.  Aortic Atherosclerosis (ICD10-I70.0). Electronically Signed   By: Morgane  Naveau M.D.   On: 01/31/2024 14:50   ECHOCARDIOGRAM COMPLETE Result Date: 01/29/2024    ECHOCARDIOGRAM REPORT   Patient Name:   REYES FIFIELD Date of Exam: 01/29/2024 Medical Rec #:  [id number]      Height:       60.0 in Accession #:    [id number]     Weight:       115.7 lb Date of Birth:  [birth year]       BSA:          1.479 m Patient Age:    48 years       BP:           96/74 mmHg Patient Gender: F              HR:           76 bpm. Exam Location:  Inpatient Procedure: 2D Echo, Cardiac Doppler and Color Doppler (Both Spectral and Color  Flow Doppler were utilized during procedure). Indications:    Acute respiratory distress  History:        Patient has prior history of Echocardiogram examinations, most                 recent 02/04/2022. ESRD; Risk Factors:Hypertension.  Sonographer:    Therisa Crouch Referring Phys: LEOMA DONALDA HERO [facility] IMPRESSIONS  1. Left ventricular ejection fraction, by estimation, is 70 to 75%. The left ventricle has hyperdynamic function. The left ventricle has no regional wall motion abnormalities. There is moderate concentric left ventricular hypertrophy. Left ventricular diastolic parameters were normal.  2. Right ventricular systolic function is moderately reduced. The right ventricular size is mildly enlarged.  3. Right atrial size was mild to moderately dilated.  4. The mitral valve is normal in structure. Mild  mitral valve regurgitation.  5. The aortic valve is tricuspid. Aortic valve regurgitation is not visualized. Aortic valve sclerosis/calcification is present, without any evidence of aortic stenosis.  6. The inferior vena cava is normal in size with greater than 50% respiratory variability, suggesting right atrial pressure of 3 mmHg. FINDINGS  Left Ventricle: Left ventricular ejection fraction, by estimation, is 70 to 75%. The left ventricle has hyperdynamic function. The left ventricle has no regional wall motion abnormalities. The left ventricular internal cavity size was normal in size. There is moderate concentric left ventricular hypertrophy. Left ventricular diastolic parameters were normal. Right Ventricle: The right ventricular size is mildly enlarged. Right vetricular wall thickness was not assessed. Right ventricular systolic function is moderately reduced. Left Atrium: Left atrial size was normal in size. Right Atrium: Right atrial size was mild to moderately dilated. Pericardium: Trivial pericardial effusion is present. Mitral Valve: The mitral valve is normal in structure. Mild mitral valve regurgitation. Tricuspid Valve: The tricuspid valve is normal in structure. Tricuspid valve regurgitation is mild. Aortic Valve: The aortic valve is tricuspid. Aortic valve regurgitation is not visualized. Aortic valve sclerosis/calcification is present, without any evidence of aortic stenosis. Aortic valve mean gradient measures 5.0 mmHg. Aortic valve peak gradient measures 8.8 mmHg. Aortic valve area, by VTI measures 2.36 cm. Pulmonic Valve: The pulmonic valve was normal in structure. Pulmonic valve regurgitation is mild to moderate. Aorta: The aortic root is normal in size and structure. Venous: The inferior vena cava is normal in size with greater than 50% respiratory variability, suggesting right atrial pressure of 3 mmHg. IAS/Shunts: No atrial level shunt detected by color flow Doppler.  LEFT VENTRICLE PLAX 2D  LVIDd:         3.60 cm   Diastology LVIDs:         2.00 cm   LV e' medial:    5.87 cm/s LV PW:         1.30 cm   LV E/e' medial:  7.9 LV IVS:        1.50 cm   LV e' lateral:   10.00 cm/s LVOT diam:     2.00 cm   LV E/e' lateral: 4.7 LV SV:         58 LV SV Index:   40 LVOT Area:     3.14 cm  RIGHT VENTRICLE          IVC RV Basal diam:  4.60 cm  IVC diam: 2.00 cm RV Mid diam:    3.70 cm RVOT diam:      3.20 cm LEFT ATRIUM             Index  RIGHT ATRIUM           Index LA diam:        2.70 cm 1.83 cm/m   RA Area:     20.10 cm LA Vol (A2C):   35.3 ml 23.86 ml/m  RA Volume:   57.00 ml  38.53 ml/m LA Vol (A4C):   42.1 ml 28.46 ml/m LA Biplane Vol: 40.9 ml 27.65 ml/m  AORTIC VALVE AV Area (Vmax):    2.33 cm AV Area (Vmean):   2.39 cm AV Area (VTI):     2.36 cm AV Vmax:           148.00 cm/s AV Vmean:          97.000 cm/s AV VTI:            0.248 m AV Peak Grad:      8.8 mmHg AV Mean Grad:      5.0 mmHg LVOT Vmax:         110.00 cm/s LVOT Vmean:        73.900 cm/s LVOT VTI:          0.186 m LVOT/AV VTI ratio: 0.75  AORTA Ao Root diam: 3.10 cm MITRAL VALVE MV Area (PHT): 4.31 cm    SHUNTS MV Decel Time: 176 msec    Systemic VTI:  0.19 m MV E velocity: 46.60 cm/s  Systemic Diam: 2.00 cm MV A velocity: 67.60 cm/s  Pulmonic Diam: 3.20 cm MV E/A ratio:  0.69 Vina Gull MD Electronically signed by Vina Gull MD Signature Date/Time: 01/29/2024/3:21:13 PM    Final    DG Chest 2 View Result Date: 01/28/2024 CLINICAL DATA:  Dyspnea EXAM: CHEST - 2 VIEW COMPARISON:  01/14/2024 FINDINGS: Cardiac shadow is stable. Aortic calcifications are noted. Slight increase in central vascular congestion is noted with mild pulmonary edema. No sizable effusion is seen. No bony abnormality is noted. IMPRESSION: Changes consistent with mild CHF. Electronically Signed   By: Oneil Devonshire M.D.   On: 01/28/2024 02:25   DG Chest Portable 1 View Result Date: 01/14/2024 CLINICAL DATA:  48 year old female with shortness of breath and  cough. Tachycardia. End stage renal disease. EXAM: PORTABLE CHEST 1 VIEW COMPARISON:  Chest radiograph 12/13/2023 and earlier. FINDINGS: Portable AP upright view at 0744 hours. Probably external round metallic device projecting over the left upper chest now. Stable cardiomegaly and mediastinal contours. Small but increased bilateral pleural effusions. Increased bilateral pulmonary vascular congestion, symmetric interstitial opacity. No pneumothorax. No air bronchograms. Visualized tracheal air column is within normal limits. No acute osseous abnormality identified. Dextroconvex scoliosis. Negative visible bowel gas. IMPRESSION: Acute pulmonary edema with small pleural effusions, progressed since 12/13/2023. Chronic cardiomegaly. Electronically Signed   By: VEAR Hurst M.D.   On: 01/14/2024 07:57     The total time spent in the appointment was 55 minutes encounter with patients including review of chart and various tests results, discussions about plan of care and coordination of care plan   All questions were answered. The patient knows to call the clinic with any problems, questions or concerns. No barriers to learning was detected.  Thank you for the courtesy of this consultation, Olam JINNY Brunner, NP  8/18/20253:36 PM  Addendum I have seen the patient, examined her. I agree with the assessment and and plan and have edited the notes.   48 year old female with history of hypertension, end-stage renal disease on dialysis, was admitted for pulmonary edema.  She was scheduled to see me in the  office for anemia and thrombocytopenia today.  We have ordered some anemia workup, which showed elevated reticulocyte count and LDH, macrocytosis, normal folate and B12, elevated ferritin.  She definitely has anemia of chronic disease from ESRD, but she may also has hemolysis based on the lab.  I will check Coombs test tomorrow.  Mild thrombocytopenia is more recent, immature platelet fraction was normal.  We ordered  ultrasound of liver and spleen to see if she has CHF induced hepatomegaly or splenomegaly, which potentially can cause her hemolysis and thrombocytopenia.  She is already on ESA and IV iron , managed by nephrology.  Will follow-up after her further workup.  I agree with blood transfusion if needed.  Onita Mattock MD 02/01/2024

## 2024-02-01 NOTE — TOC Progression Note (Signed)
 Transition of Care [facility]) - Progression Note    Patient Details  Name: Madeline Brown MRN: [id number] Date of Birth: [birth year]  [facility]) CM/SW Contact  Tom-Johnson, Belvie Iribe Daphne, RN Phone Number: 02/01/2024, 3:29 PM  Clinical Narrative:     Patient requested portable O2 tank to transport with at discharge. Patient receives home O2 from [employer], CM notified Zachary and will deliver to patient at bedside.  CM will continue to follow as patient progresses with care towards discharge.         Expected Discharge Plan: Home/Self Care Barriers to Discharge: Continued Medical Work up               Expected Discharge Plan and Services In-house Referral: Clinical Social Work     Living arrangements for the past 2 months: Single Family Home                                       Social Drivers of Health (SDOH) Interventions SDOH Screenings   Food Insecurity: No Food Insecurity (01/28/2024)  Housing: Low Risk  (01/28/2024)  Transportation Needs: No Transportation Needs (01/28/2024)  Utilities: Not At Risk (01/28/2024)  Depression (PHQ2-9): Low Risk  (03/23/2019)  Financial Resource Strain: Low Risk  (11/03/2022)   Received from [facility]  Tobacco Use: Medium Risk (01/28/2024)    Readmission Risk Interventions    08/08/2022   11:18 AM  Readmission Risk Prevention Plan  Transportation Screening Complete  HRI or Home Care Consult Complete  Social Work Consult for Recovery Care Planning/Counseling Complete  Palliative Care Screening Not Applicable  Medication Review Oceanographer) Complete

## 2024-02-02 ENCOUNTER — Other Ambulatory Visit (HOSPITAL_COMMUNITY): Payer: Self-pay

## 2024-02-02 DIAGNOSIS — J810 Acute pulmonary edema: Secondary | ICD-10-CM | POA: Diagnosis not present

## 2024-02-02 LAB — DIRECT ANTIGLOBULIN TEST (NOT AT ARMC)
DAT, IgG: NEGATIVE
DAT, complement: NEGATIVE

## 2024-02-02 LAB — DIC (DISSEMINATED INTRAVASCULAR COAGULATION)PANEL
D-Dimer, Quant: 1.71 ug{FEU}/mL — ABNORMAL HIGH (ref 0.00–0.50)
Fibrinogen: 518 mg/dL — ABNORMAL HIGH (ref 210–475)
INR: 1.1 (ref 0.8–1.2)
Platelets: 175 K/uL (ref 150–400)
Prothrombin Time: 15.1 s (ref 11.4–15.2)
Smear Review: NONE SEEN
aPTT: 30 s (ref 24–36)

## 2024-02-02 LAB — HAPTOGLOBIN: Haptoglobin: 56 mg/dL (ref 42–296)

## 2024-02-02 LAB — HEPATITIS B SURFACE ANTIBODY, QUANTITATIVE: Hep B S AB Quant (Post): 989 m[IU]/mL

## 2024-02-02 MED ORDER — ONDANSETRON 4 MG PO TBDP
4.0000 mg | ORAL_TABLET | Freq: Three times a day (TID) | ORAL | 0 refills | Status: DC | PRN
  Filled 2024-02-02: qty 20, 7d supply, fill #0

## 2024-02-02 NOTE — Discharge Planning (Signed)
 [facility] Patient Discharge Orders- [facility] CLINIC: [place]   Patient's name: Madeline Brown Admit/DC Dates: 01/28/2024 - 02/02/2024  Discharge Diagnoses: Acute on chronic respiratory failure. Volume removal with HD x 3. Believe she is at her dry weight now.   Outpatient Dialysis Orders:  -Heparin : No change  -EDW: No change yet but unclear if she can reach 48kg.   -[place]: No change   Anemia Aranesp : Given: --   Date of last dose/amount: --   PRBC's Given: -- Date/# of units: -- ESA dose for discharge: Mircera 200 mcg IV q 2 wks (Due 8/26)  Recent Labs  Lab 01/30/24 0900 02/01/24 1523  HGB 7.3*  --   K 4.2  --   CALCIUM  8.9  --   PHOS 4.7*  --   ALBUMIN  2.8* 4.2   Access intervention/Change: None    Medications: -IV Antibiotics: --   OTHER/APPTS/LABS   Completed by: Maisie Ronnald Acosta PA-C   D/C Meds to be reconciled by nurse after every discharge.    Reviewed by: MD:______ RN_______

## 2024-02-02 NOTE — Progress Notes (Signed)
 Pt for d/c today. Contacted nephrologist and renal PA this morning to inquire if pt would be appropriate for out-pt HD later today or tomorrow at pt's clinic. Nephrologist states pt is appropriate for either. Contacted [facility] to request an appt for later today or tomorrow. Clinic can treat pt tomorrow. Pt will need to arrive at 11:45 am for 12:05 pm start time. Met with pt at bedside and pt agreeable to appt tomorrow. Pt requesting assistance with calling transportation. Pt states she uses [place] transportation. Contacted [place] [facility] transportation to make arrangements for transportation to HD appt tomorrow. Pt aware that transportation provider will call her with time that transportation will pick pt up tomorrow. Pt agreeable. HD appt added to AVS and team aware of the above information. Clinic aware pt is agreeable to appt tomorrow and know to expect pt at that time.   Randine Mungo Dialysis Navigator [phone]

## 2024-02-02 NOTE — Progress Notes (Signed)
 [facility] Progress Note   Subjective:   Seen in room. No new events overnight. Denies sob, chest pain. On 2L [place]. Ready to go home. Will resume HD at her outpatient center Wed.   Objective Vitals:   02/01/24 0335 02/01/24 1705 02/01/24 2117 02/02/24 0644  BP: 91/67 91/78 109/71 (!) 88/62  Pulse: 89 100 96 90  Resp: 17  18 17   Temp: 97.7 F (36.5 C) 97.9 F (36.6 C) 97.8 F (36.6 C)   TempSrc: Oral Oral Oral   SpO2: 95% 96% 96% 100%  Weight:      Height:       Physical Exam General: alert, nad, on nasal oxygen  Heart: RRR  Lungs: Clear, bilaterally  Abdomen: non tender  Extremities: no LE edema Dialysis Access: LU AVG +b   Filed Weights   01/30/24 1201 01/31/24 1554 01/31/24 2333  Weight: 51.9 kg 49.7 kg 50.7 kg   No intake or output data in the 24 hours ending 02/02/24 0933   Additional Objective Labs: Basic Metabolic Panel: Recent Labs  Lab 01/28/24 0345 01/28/24 0843 01/29/24 0649 01/30/24 0900  NA 134*  --  136 132*  K 4.7  --  4.5 4.2  CL 94*  --  96* 93*  CO2 27  --  24 25  GLUCOSE 90  --  119* 105*  BUN 64*  --  50* 75*  CREATININE 8.52* 9.13* 6.66* 9.32*  CALCIUM  9.1  --  9.1 8.9  PHOS  --   --   --  4.7*   Liver Function Tests: Recent Labs  Lab 01/29/24 0649 01/30/24 0900 02/01/24 1523  AST 22  --  52*  ALT 30  --  50*  ALKPHOS 48  --  54  BILITOT 1.4*  --  1.4*  PROT 6.9  --  8.9*  ALBUMIN  2.8* 2.8* 4.2    CBC: Recent Labs  Lab 01/28/24 0345 01/28/24 0843 01/29/24 0649 01/30/24 0900 02/02/24 0536  WBC 4.9 4.6 4.8 5.3  --   NEUTROABS 4.0  --   --   --   --   HGB 7.2* 7.6* 8.3* 7.3*  --   HCT 22.5* 24.1* 26.2* 23.2*  --   MCV 103.2* 104.3* 103.1* 103.6*  --   PLT 87* 92* 129* 132* 175   Studies/Results: US  Abdomen Complete Result Date: 02/01/2024 CLINICAL DATA:  Pancytopenia EXAM: ABDOMEN ULTRASOUND COMPLETE COMPARISON:  None Available. FINDINGS: Gallbladder: Small stones within the gallbladder, the largest  1 cm. No wall thickening or sonographic Murphy sign. Common bile duct: Diameter: Normal caliber, 2 mm Liver: No focal lesion identified. Within normal limits in parenchymal echogenicity. Portal vein is patent on color Doppler imaging with normal direction of blood flow towards the liver. IVC: No abnormality visualized. Pancreas: Visualized portion unremarkable. Spleen: Size and appearance within normal limits. Right Kidney: Length: 9.2 cm. Diffusely increased echotexture. Multiple small cysts, the largest 1.6 cm. No follow-up imaging recommended. No hydronephrosis. Left Kidney: Length: 9.6 cm. Increased echotexture. Multiple cysts, the largest 2.1 cm. No follow-up imaging recommended. No hydronephrosis. Abdominal aorta: No aneurysm visualized. Other findings: None. IMPRESSION: No acute findings. Cholelithiasis.  No sonographic evidence of acute cholecystitis. Echogenic kidneys bilaterally compatible with chronic medical renal disease. Electronically Signed   By: Franky Crease M.D.   On: 02/01/2024 21:02   LONG TERM MONITOR (3-14 DAYS) Result Date: 02/01/2024 Patch Wear Time:  13 days and 16 hours (2025-07-23T11:00:45-0400 to 2025-08-06T03:31:10-398) Patient had a min HR of  33 bpm, max HR of 127 bpm, and avg HR of 90 bpm. Predominant underlying rhythm was Sinus Rhythm. 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 Isolated SVEs were rare (<1.0%), SVE Couplets were rare (<1.0%), and SVE Triplets were rare (<1.0%). Isolated VEs were rare (<1.0%), VE Couplets were rare (<1.0%), and no VE Triplets were present. Ventricular Bigeminy and Trigeminy were present. Maude Emmer MD [facility]   DG Chest 2 View Result Date: 01/31/2024 CLINICAL DATA:  [id number] Follow-up exam [id number] ESRD (end stage renal disease) [facility]) [id number] Pulmonary edema [id number] EXAM: CHEST - 2 VIEW COMPARISON:  Chest x-ray 01/28/2024, CT angiography chest 06/08/2023, chest x-ray 08/06/2022 FINDINGS:  The heart and mediastinal contours are unchanged. Atherosclerotic plaque. Enlarged main pulmonary artery. No focal consolidation. Persistent pulmonary edema. Trace left pleural effusion. No pneumothorax. No acute osseous abnormality. IMPRESSION: 1. Persistent pulmonary edema. 2. Enlarged main pulmonary artery-correlate for pulmonary hypertension. 3.  Aortic Atherosclerosis (ICD10-I70.0). Electronically Signed   By: Morgane  Naveau M.D.   On: 01/31/2024 14:50    Medications:   calcitRIOL   0.75 mcg Oral Q M,W,F   Chlorhexidine  Gluconate Cloth  6 each Topical Q0600   cinacalcet   30 mg Oral Daily   feeding supplement (NEPRO CARB STEADY)  237 mL Oral BID BM   heparin   5,000 Units Subcutaneous Q8H   levETIRAcetam   1,000 mg Oral Daily   midodrine   10 mg Oral TID WC   sodium chloride  flush  3 mL Intravenous Q12H    Dialysis Orders: [facility] TTS 3.75 hours EDW 48kg 2K/2Ca bath No heparin  350/700 AVG EDW Mircera 100 mcg q 2 weeks - last dose 01/26/24 - Venofer  50 mg weekly - last dose 8/12 Calcitriol  0.75 mcg three times per week at HD Sensipar  60 mg daily   Assessment/Plan: Acute on chronic hypoxic respiratory failure. Pulmonary edema on admission.  Echo with EF 70-75%, LVH, moderately reduced right systolic function, no valvular disease. Outpatient dry weight has already  been lowered. Repeat CXR  8/17 with persistent pulm edema but may have been over read.  Had dialysis overnight for additional volume removal but didn't tolerate UF well. Lungs clear on exam today. Believe she now at her dry weight.  ESRD:  HD TTS. Resume HD at outpatient center.  Hypotension with HD.  On midodrine  10mg  TID for BP support.  Volume: Believe she is now euvolemic. Didn't tolerate UF with HD on Sunday. EDW 48 kg. -hasn't been able to reach if weights are accurate.   Anemia: Macrocytic anemia. B12 and folate level in goal. Patient on ESA at HD. Last ESA given 01/26/24. Hgb 7.3. Transfuse if hgb  < 7 while in hospital.  Patient gets weekly Venofer  at HD. Likely need to increase outpatient ESA dose.   Metabolic bone disease: Ca and phos in goal.  Continue VDRA and Sensipar  60 mg daily. Not on binders while here. On velphoro  1 AC TID at home.    Nutrition:  Renal diet with fluid restriction.protein supplements.   Madeline Ronnald Acosta PA-C [facility] 02/02/2024,9:33 AM

## 2024-02-02 NOTE — Care Management Important Message (Signed)
 Important Message  Patient Details  Name: Madeline Brown MRN: [id number] Date of Birth: [birth year]   Important Message Given:  Yes - Medicare IM  Patient left prior to IM delivery will mail a copy to the patient address.    Deondre Marinaro 02/02/2024, 12:56 PM

## 2024-02-02 NOTE — Discharge Summary (Addendum)
 Physician Discharge Summary  Madeline Brown FMW:[id number] DOB: [birth year] DOA: 01/28/2024  PCP: [facility] [address]  Admit date: 01/28/2024 Discharge date: 02/02/2024  Admitted From: Home Disposition:  Home  Recommendations for Outpatient Follow-up:  Follow up with PCP in 1-2 weeks Please obtain BMP/CBC in one week   Home Health:No Equipment/Devices: 2 L of oxygen  Discharge Condition:Stab;le CODE STATUS:Full Diet recommendation: Heart Healthy   Brief/Interim Summary:  48 y.o. female past medical history significant for end-stage renal disease, essential hypertension, seizure, chronic respiratory failure with hypoxia on 3 L oxygen at home does not uses it 24 hours, comes into the hospital for shortness of breath that woke her up from her sleep.  In the ED chest x-ray showed pulmonary edema.    Discharge Diagnoses:  Principal Problem:   Flash pulmonary edema (HCC) Active Problems:   Acute hypoxemic respiratory failure (HCC)   Anemia of chronic kidney failure, stage 5 (HCC)   Essential hypertension   ESRD on dialysis (HCC)- on T, Th, Sat   Seizure disorder (HCC)   Macrocytic anemia   Anemia of chronic disease   AKI (acute kidney injury) (HCC)  Acute on chronic respiratory failure with hypoxia due to flash pulmonary edema in the setting of end-stage renal disease: According to patient she has been compliant with her treatment. Nephrology was consulted and she was dialyzed. This is as dry as they can get her. She still remains on 2 L of oxygen. Her estimated dry weight documented in the chart is 15.7 kg.  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 Noted.  End-stage renal disease: She usually dialyzes Tuesday Thursdays and Saturdays.  History of ICH: Noted.  History of seizures: Continue Keppra .  History of essential hypertension: Continue midodrine  and metoprolol .  Macrocytic anemia: B12 and  folate were unremarkable.  Nephrology thrombocytopenia: Platelets are improving.   Discharge Instructions  Discharge Instructions     Diet - low sodium heart healthy   Complete by: As directed    Increase activity slowly   Complete by: As directed       Allergies as of 02/02/2024       Reactions   Glycerin-hypromellose-peg 400 Swelling   Visine [tetrahydrozoline Hcl] Swelling, Other (See Comments)   Eyes Swell        Medication List     TAKE these medications    acetaminophen  500 MG tablet Commonly known as: TYLENOL  Take 500-1,000 mg by mouth every 6 (six) hours as needed for mild pain or headache.   albuterol  108 (90 Base) MCG/ACT inhaler Commonly known as: VENTOLIN  HFA Inhale 1-2 puffs into the lungs every 6 (six) hours as needed for wheezing or shortness of breath.   calcitRIOL  0.25 MCG capsule Commonly known as: ROCALTROL  Take 3 capsules (0.75 mcg total) by mouth every Monday, Wednesday, and Friday. What changed:  how much to take when to take this additional instructions   carvedilol  25 MG tablet Commonly known as: COREG  Take 1 tablet (25 mg total) by mouth 2 (two) times daily with a meal. Take 25 mg by mouth at 6:30 AM and 6:00 PM on Sun/Mon/Wed/Fri and 25 mg at 11:00 AM and 8:00 PM on Tues/Thurs/Sat   cinacalcet  30 MG tablet Commonly known as: SENSIPAR  Take 30 mg by mouth daily.   DIALYVITE TABLET Tabs Take 1 tablet by mouth daily.   levETIRAcetam  1000 MG tablet Commonly known as: KEPPRA  Take 1,000 mg by mouth daily.  midodrine  5 MG tablet Commonly known as: PROAMATINE  Take 1 tablet by mouth See admin instructions. Take 1 tablet by mouth on dialysis days   ondansetron  4 MG disintegrating tablet Commonly known as: ZOFRAN -ODT Take 1 tablet (4 mg total) by mouth every 8 (eight) hours as needed for nausea or vomiting.   sevelamer  carbonate 800 MG tablet Commonly known as: RENVELA  Take 800 mg by mouth See admin instructions. Take 800mg  (1  tab) with snacks.   Velphoro  500 MG chewable tablet Generic drug: sucroferric oxyhydroxide Chew 500 mg by mouth 3 (three) times daily with meals.        Follow-up Information     [facility], [facility]. Go on 02/03/2024.   Why: Please arrive at 11:45 am for 12:05 pm start time. Contact information: [address] [place] [address] [phone]                Allergies  Allergen Reactions   Glycerin-Hypromellose-Peg 400 Swelling   Visine [Tetrahydrozoline Hcl] Swelling and Other (See Comments)    Eyes Swell    Consultations: Nephrology   Procedures/Studies: US  Abdomen Complete Result Date: 02/01/2024 CLINICAL DATA:  Pancytopenia EXAM: ABDOMEN ULTRASOUND COMPLETE COMPARISON:  None Available. FINDINGS: Gallbladder: Small stones within the gallbladder, the largest 1 cm. No wall thickening or sonographic Murphy sign. Common bile duct: Diameter: Normal caliber, 2 mm Liver: No focal lesion identified. Within normal limits in parenchymal echogenicity. Portal vein is patent on color Doppler imaging with normal direction of blood flow towards the liver. IVC: No abnormality visualized. Pancreas: Visualized portion unremarkable. Spleen: Size and appearance within normal limits. Right Kidney: Length: 9.2 cm. Diffusely increased echotexture. Multiple small cysts, the largest 1.6 cm. No follow-up imaging recommended. No hydronephrosis. Left Kidney: Length: 9.6 cm. Increased echotexture. Multiple cysts, the largest 2.1 cm. No follow-up imaging recommended. No hydronephrosis. Abdominal aorta: No aneurysm visualized. Other findings: None. IMPRESSION: No acute findings. Cholelithiasis.  No sonographic evidence of acute cholecystitis. Echogenic kidneys bilaterally compatible with chronic medical renal disease. Electronically Signed   By: Franky Crease M.D.   On: 02/01/2024 21:02   LONG TERM MONITOR (3-14 DAYS) Result Date: 02/01/2024 Patch Wear Time:  13 days and 16  hours (2025-07-23T11:00:45-0400 to 2025-08-06T03:31:10-398) Patient had a min HR of 33 bpm, max HR of 127 bpm, and avg HR of 90 bpm. Predominant underlying rhythm was Sinus Rhythm. 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 Isolated SVEs were rare (<1.0%), SVE Couplets were rare (<1.0%), and SVE Triplets were rare (<1.0%). Isolated VEs were rare (<1.0%), VE Couplets were rare (<1.0%), and no VE Triplets were present. Ventricular Bigeminy and Trigeminy were present. Maude Emmer MD [facility]   DG Chest 2 View Result Date: 01/31/2024 CLINICAL DATA:  [id number] Follow-up exam [id number] ESRD (end stage renal disease) [facility]) [id number] Pulmonary edema [id number] EXAM: CHEST - 2 VIEW COMPARISON:  Chest x-ray 01/28/2024, CT angiography chest 06/08/2023, chest x-ray 08/06/2022 FINDINGS: The heart and mediastinal contours are unchanged. Atherosclerotic plaque. Enlarged main pulmonary artery. No focal consolidation. Persistent pulmonary edema. Trace left pleural effusion. No pneumothorax. No acute osseous abnormality. IMPRESSION: 1. Persistent pulmonary edema. 2. Enlarged main pulmonary artery-correlate for pulmonary hypertension. 3.  Aortic Atherosclerosis (ICD10-I70.0). Electronically Signed   By: Morgane  Naveau M.D.   On: 01/31/2024 14:50   ECHOCARDIOGRAM COMPLETE Result Date: 01/29/2024    ECHOCARDIOGRAM REPORT   Patient Name:   Madeline Brown Date of Exam: 01/29/2024 Medical Rec #:  [id number]      Height:       60.0 in Accession #:    [id number]     Weight:       115.7 lb Date of Birth:  1976/01/24       BSA:          1.479 m Patient Age:    48 years       BP:           96/74 mmHg Patient Gender: F              HR:           76 bpm. Exam Location:  Inpatient Procedure: 2D Echo, Cardiac Doppler and Color Doppler (Both Spectral and Color            Flow Doppler were utilized during procedure). Indications:    Acute respiratory distress  History:        Patient has prior  history of Echocardiogram examinations, most                 recent 02/04/2022. ESRD; Risk Factors:Hypertension.  Sonographer:    Therisa Crouch Referring Phys: LEOMA DONALDA HERO [facility] IMPRESSIONS  1. Left ventricular ejection fraction, by estimation, is 70 to 75%. The left ventricle has hyperdynamic function. The left ventricle has no regional wall motion abnormalities. There is moderate concentric left ventricular hypertrophy. Left ventricular diastolic parameters were normal.  2. Right ventricular systolic function is moderately reduced. The right ventricular size is mildly enlarged.  3. Right atrial size was mild to moderately dilated.  4. The mitral valve is normal in structure. Mild mitral valve regurgitation.  5. The aortic valve is tricuspid. Aortic valve regurgitation is not visualized. Aortic valve sclerosis/calcification is present, without any evidence of aortic stenosis.  6. The inferior vena cava is normal in size with greater than 50% respiratory variability, suggesting right atrial pressure of 3 mmHg. FINDINGS  Left Ventricle: Left ventricular ejection fraction, by estimation, is 70 to 75%. The left ventricle has hyperdynamic function. The left ventricle has no regional wall motion abnormalities. The left ventricular internal cavity size was normal in size. There is moderate concentric left ventricular hypertrophy. Left ventricular diastolic parameters were normal. Right Ventricle: The right ventricular size is mildly enlarged. Right vetricular wall thickness was not assessed. Right ventricular systolic function is moderately reduced. Left Atrium: Left atrial size was normal in size. Right Atrium: Right atrial size was mild to moderately dilated. Pericardium: Trivial pericardial effusion is present. Mitral Valve: The mitral valve is normal in structure. Mild mitral valve regurgitation. Tricuspid Valve: The tricuspid valve is normal in structure. Tricuspid valve regurgitation is mild. Aortic Valve: The  aortic valve is tricuspid. Aortic valve regurgitation is not visualized. Aortic valve sclerosis/calcification is present, without any evidence of aortic stenosis. Aortic valve mean gradient measures 5.0 mmHg. Aortic valve peak gradient measures 8.8 mmHg. Aortic valve area, by VTI measures 2.36 cm. Pulmonic Valve: The pulmonic valve was normal in structure. Pulmonic valve regurgitation is mild to moderate. Aorta: The aortic root is normal in size and structure. Venous: The inferior vena cava is normal in size with greater than 50% respiratory variability, suggesting right atrial pressure of 3 mmHg. IAS/Shunts: No atrial level shunt detected by color flow Doppler.  LEFT VENTRICLE PLAX 2D LVIDd:         3.60 cm   Diastology LVIDs:         2.00 cm   LV e' medial:  5.87 cm/s LV PW:         1.30 cm   LV E/e' medial:  7.9 LV IVS:        1.50 cm   LV e' lateral:   10.00 cm/s LVOT diam:     2.00 cm   LV E/e' lateral: 4.7 LV SV:         58 LV SV Index:   40 LVOT Area:     3.14 cm  RIGHT VENTRICLE          IVC RV Basal diam:  4.60 cm  IVC diam: 2.00 cm RV Mid diam:    3.70 cm RVOT diam:      3.20 cm LEFT ATRIUM             Index        RIGHT ATRIUM           Index LA diam:        2.70 cm 1.83 cm/m   RA Area:     20.10 cm LA Vol (A2C):   35.3 ml 23.86 ml/m  RA Volume:   57.00 ml  38.53 ml/m LA Vol (A4C):   42.1 ml 28.46 ml/m LA Biplane Vol: 40.9 ml 27.65 ml/m  AORTIC VALVE AV Area (Vmax):    2.33 cm AV Area (Vmean):   2.39 cm AV Area (VTI):     2.36 cm AV Vmax:           148.00 cm/s AV Vmean:          97.000 cm/s AV VTI:            0.248 m AV Peak Grad:      8.8 mmHg AV Mean Grad:      5.0 mmHg LVOT Vmax:         110.00 cm/s LVOT Vmean:        73.900 cm/s LVOT VTI:          0.186 m LVOT/AV VTI ratio: 0.75  AORTA Ao Root diam: 3.10 cm MITRAL VALVE MV Area (PHT): 4.31 cm    SHUNTS MV Decel Time: 176 msec    Systemic VTI:  0.19 m MV E velocity: 46.60 cm/s  Systemic Diam: 2.00 cm MV A velocity: 67.60 cm/s  Pulmonic  Diam: 3.20 cm MV E/A ratio:  0.69 Vina Gull MD Electronically signed by Vina Gull MD Signature Date/Time: 01/29/2024/3:21:13 PM    Final    DG Chest 2 View Result Date: 01/28/2024 CLINICAL DATA:  Dyspnea EXAM: CHEST - 2 VIEW COMPARISON:  01/14/2024 FINDINGS: Cardiac shadow is stable. Aortic calcifications are noted. Slight increase in central vascular congestion is noted with mild pulmonary edema. No sizable effusion is seen. No bony abnormality is noted. IMPRESSION: Changes consistent with mild CHF. Electronically Signed   By: Oneil Devonshire M.D.   On: 01/28/2024 02:25   DG Chest Portable 1 View Result Date: 01/14/2024 CLINICAL DATA:  48 year old female with shortness of breath and cough. Tachycardia. End stage renal disease. EXAM: PORTABLE CHEST 1 VIEW COMPARISON:  Chest radiograph 12/13/2023 and earlier. FINDINGS: Portable AP upright view at 0744 hours. Probably external round metallic device projecting over the left upper chest now. Stable cardiomegaly and mediastinal contours. Small but increased bilateral pleural effusions. Increased bilateral pulmonary vascular congestion, symmetric interstitial opacity. No pneumothorax. No air bronchograms. Visualized tracheal air column is within normal limits. No acute osseous abnormality identified. Dextroconvex scoliosis. Negative visible bowel gas. IMPRESSION: Acute pulmonary edema with small pleural effusions, progressed  since 12/13/2023. Chronic cardiomegaly. Electronically Signed   By: VEAR Hurst M.D.   On: 01/14/2024 07:57   (Echo, Carotid, EGD, Colonoscopy, ERCP)    Subjective: No complaints  Discharge Exam: Vitals:   02/01/24 2117 02/02/24 0644  BP: 109/71 (!) 88/62  Pulse: 96 90  Resp: 18 17  Temp: 97.8 F (36.6 C)   SpO2: 96% 100%   Vitals:   02/01/24 0335 02/01/24 1705 02/01/24 2117 02/02/24 0644  BP: 91/67 91/78 109/71 (!) 88/62  Pulse: 89 100 96 90  Resp: 17  18 17   Temp: 97.7 F (36.5 C) 97.9 F (36.6 C) 97.8 F (36.6 C)    TempSrc: Oral Oral Oral   SpO2: 95% 96% 96% 100%  Weight:      Height:        General: Pt is alert, awake, not in acute distress Cardiovascular: RRR, S1/S2 +, no rubs, no gallops Respiratory: CTA bilaterally, no wheezing, no rhonchi Abdominal: Soft, NT, ND, bowel sounds + Extremities: no edema, no cyanosis    The results of significant diagnostics from this hospitalization (including imaging, microbiology, ancillary and laboratory) are listed below for reference.     Microbiology: Recent Results (from the past 240 hours)  Resp panel by RT-PCR (RSV, Flu A&B, Covid) Anterior Nasal Swab     Status: None   Collection Time: 01/28/24  1:45 AM   Specimen: Anterior Nasal Swab  Result Value Ref Range Status   SARS Coronavirus 2 by RT PCR NEGATIVE NEGATIVE Final   Influenza A by PCR NEGATIVE NEGATIVE Final   Influenza B by PCR NEGATIVE NEGATIVE Final    Comment: (NOTE) The Xpert Xpress SARS-CoV-2/FLU/RSV plus assay is intended as an aid in the diagnosis of influenza from Nasopharyngeal swab specimens and should not be used as a sole basis for treatment. Nasal washings and aspirates are unacceptable for Xpert Xpress SARS-CoV-2/FLU/RSV testing.  Fact Sheet for Patients: BloggerCourse.com  Fact Sheet for Healthcare Providers: SeriousBroker.it  This test is not yet approved or cleared by the [place]  FDA and has been authorized for detection and/or diagnosis of SARS-CoV-2 by FDA under an Emergency Use Authorization (EUA). This EUA will remain in effect (meaning this test can be used) for the duration of the COVID-19 declaration under Section 564(b)(1) of the Act, 21 U.S.C. section 360bbb-3(b)(1), unless the authorization is terminated or revoked.     Resp Syncytial Virus by PCR NEGATIVE NEGATIVE Final    Comment: (NOTE) Fact Sheet for Patients: BloggerCourse.com  Fact Sheet for Healthcare  Providers: SeriousBroker.it  This test is not yet approved or cleared by the [place]  FDA and has been authorized for detection and/or diagnosis of SARS-CoV-2 by FDA under an Emergency Use Authorization (EUA). This EUA will remain in effect (meaning this test can be used) for the duration of the COVID-19 declaration under Section 564(b)(1) of the Act, 21 U.S.C. section 360bbb-3(b)(1), unless the authorization is terminated or revoked.  Performed at [facility], [address] [place], [place] [address]   MRSA Next Gen by PCR, Nasal     Status: None   Collection Time: 01/28/24  5:18 PM   Specimen: Nasal Mucosa; Nasal Swab  Result Value Ref Range Status   MRSA by PCR Next Gen NOT DETECTED NOT DETECTED Final    Comment: (NOTE) The GeneXpert MRSA Assay (FDA approved for NASAL specimens only), is one component of a comprehensive MRSA colonization surveillance program. It is not intended to diagnose MRSA infection nor to guide  or monitor treatment for MRSA infections. Test performance is not FDA approved in patients less than 48 years old. Performed at [facility], [address], [place], [place] [address]      Labs: BNP (last 3 results) Recent Labs    12/13/23 2137 01/14/24 0651 01/28/24 0345  BNP 201.2* 2,111.4* 2,270.9*   Basic Metabolic Panel: Recent Labs  Lab 01/28/24 0345 01/28/24 0843 01/29/24 0649 01/30/24 0900  NA 134*  --  136 132*  K 4.7  --  4.5 4.2  CL 94*  --  96* 93*  CO2 27  --  24 25  GLUCOSE 90  --  119* 105*  BUN 64*  --  50* 75*  CREATININE 8.52* 9.13* 6.66* 9.32*  CALCIUM  9.1  --  9.1 8.9  PHOS  --   --   --  4.7*   Liver Function Tests: Recent Labs  Lab 01/29/24 0649 01/30/24 0900 02/01/24 1523  AST 22  --  52*  ALT 30  --  50*  ALKPHOS 48  --  54  BILITOT 1.4*  --  1.4*  PROT 6.9  --  8.9*  ALBUMIN  2.8* 2.8* 4.2   No results for input(s): LIPASE, AMYLASE in the last 168 hours. No  results for input(s): AMMONIA in the last 168 hours. CBC: Recent Labs  Lab 01/28/24 0345 01/28/24 0843 01/29/24 0649 01/30/24 0900 02/02/24 0536  WBC 4.9 4.6 4.8 5.3  --   NEUTROABS 4.0  --   --   --   --   HGB 7.2* 7.6* 8.3* 7.3*  --   HCT 22.5* 24.1* 26.2* 23.2*  --   MCV 103.2* 104.3* 103.1* 103.6*  --   PLT 87* 92* 129* 132* 175   Cardiac Enzymes: No results for input(s): CKTOTAL, CKMB, CKMBINDEX, TROPONINI in the last 168 hours. BNP: Invalid input(s): POCBNP CBG: No results for input(s): GLUCAP in the last 168 hours. D-Dimer Recent Labs    02/02/24 0536  DDIMER 1.71*   Hgb A1c No results for input(s): HGBA1C in the last 72 hours. Lipid Profile No results for input(s): CHOL, HDL, LDLCALC, TRIG, CHOLHDL, LDLDIRECT in the last 72 hours. Thyroid  function studies No results for input(s): TSH, T4TOTAL, T3FREE, THYROIDAB in the last 72 hours.  Invalid input(s): FREET3 Anemia work up Recent Labs    02/01/24 1523  RETICCTPCT 6.0*   Urinalysis    Component Value Date/Time   COLORURINE YELLOW 02/19/2019 1545   APPEARANCEUR CLOUDY (A) 02/19/2019 1545   LABSPEC 1.011 02/19/2019 1545   PHURINE 9.0 (H) 02/19/2019 1545   GLUCOSEU NEGATIVE 02/19/2019 1545   HGBUR NEGATIVE 02/19/2019 1545   BILIRUBINUR NEGATIVE 02/19/2019 1545   KETONESUR NEGATIVE 02/19/2019 1545   PROTEINUR >=300 (A) 02/19/2019 1545   UROBILINOGEN 0.2 06/17/2016 1257   NITRITE NEGATIVE 02/19/2019 1545   LEUKOCYTESUR NEGATIVE 02/19/2019 1545   Sepsis Labs Recent Labs  Lab 01/28/24 0345 01/28/24 0843 01/29/24 0649 01/30/24 0900  WBC 4.9 4.6 4.8 5.3   Microbiology Recent Results (from the past 240 hours)  Resp panel by RT-PCR (RSV, Flu A&B, Covid) Anterior Nasal Swab     Status: None   Collection Time: 01/28/24  1:45 AM   Specimen: Anterior Nasal Swab  Result Value Ref Range Status   SARS Coronavirus 2 by RT PCR NEGATIVE NEGATIVE Final   Influenza A by  PCR NEGATIVE NEGATIVE Final   Influenza B by PCR NEGATIVE NEGATIVE Final    Comment: (NOTE) The Xpert Xpress  SARS-CoV-2/FLU/RSV plus assay is intended as an aid in the diagnosis of influenza from Nasopharyngeal swab specimens and should not be used as a sole basis for treatment. Nasal washings and aspirates are unacceptable for Xpert Xpress SARS-CoV-2/FLU/RSV testing.  Fact Sheet for Patients: BloggerCourse.com  Fact Sheet for Healthcare Providers: SeriousBroker.it  This test is not yet approved or cleared by the [place]  FDA and has been authorized for detection and/or diagnosis of SARS-CoV-2 by FDA under an Emergency Use Authorization (EUA). This EUA will remain in effect (meaning this test can be used) for the duration of the COVID-19 declaration under Section 564(b)(1) of the Act, 21 U.S.C. section 360bbb-3(b)(1), unless the authorization is terminated or revoked.     Resp Syncytial Virus by PCR NEGATIVE NEGATIVE Final    Comment: (NOTE) Fact Sheet for Patients: BloggerCourse.com  Fact Sheet for Healthcare Providers: SeriousBroker.it  This test is not yet approved or cleared by the [place]  FDA and has been authorized for detection and/or diagnosis of SARS-CoV-2 by FDA under an Emergency Use Authorization (EUA). This EUA will remain in effect (meaning this test can be used) for the duration of the COVID-19 declaration under Section 564(b)(1) of the Act, 21 U.S.C. section 360bbb-3(b)(1), unless the authorization is terminated or revoked.  Performed at [facility], [address] [place], [place] [address]   MRSA Next Gen by PCR, Nasal     Status: None   Collection Time: 01/28/24  5:18 PM   Specimen: Nasal Mucosa; Nasal Swab  Result Value Ref Range Status   MRSA by PCR Next Gen NOT DETECTED NOT DETECTED Final    Comment: (NOTE) The GeneXpert MRSA Assay  (FDA approved for NASAL specimens only), is one component of a comprehensive MRSA colonization surveillance program. It is not intended to diagnose MRSA infection nor to guide or monitor treatment for MRSA infections. Test performance is not FDA approved in patients less than 20 years old. Performed at [facility], [address], [place], [place] [address]      Time coordinating discharge: Over 35 minutes  SIGNED:   Erle Odell Castor, MD  [facility] 02/02/2024, 10:02 AM Pager   If 7PM-7AM, please contact night-coverage [email or web address] Password [email or web address]

## 2024-02-02 NOTE — Progress Notes (Signed)
 I went over discharge paperwork with the patient. Madeline Brown verbalized understanding of upcoming HD treatment tomorrow at 11:45am. Patients morning medications were given and PIV removed. Waiting on patients ride for discharge.

## 2024-02-02 NOTE — TOC Transition Note (Signed)
 Transition of Care [facility]) - Discharge Note   Patient Details  Name: Madeline Brown MRN: [id number] Date of Birth: [birth year]  Transition of Care [facility]) CM/SW Contact:  Tom-Johnson, Harvest Muskrat, RN Phone Number: 02/02/2024, 9:27 AM   Clinical Narrative:     Patient is scheduled for discharge today.  Readmission Risk Assessment done. Outpatient f/u, hospital f/u and discharge instructions on AVS. Portable O2 tank at bedside to transport with at discharge.  Significant other, Vicenta to transport at discharge.  No further TOC needs noted.        Final next level of care: Home/Self Care Barriers to Discharge: Barriers Resolved   Patient Goals and CMS Choice Patient states their goals for this hospitalization and ongoing recovery are:: To return home CMS Medicare.gov Compare Post Acute Care list provided to:: Patient Choice offered to / list presented to : Patient      Discharge Placement                Patient to be transferred to facility by: Significant other Name of family member notified: [facility]    Discharge Plan and Services Additional resources added to the After Visit Summary for   In-house Referral: Clinical Social Work              DME Arranged: N/A DME Agency: NA       HH Arranged: NA HH Agency: NA        Social Drivers of Health (SDOH) Interventions SDOH Screenings   Food Insecurity: No Food Insecurity (01/28/2024)  Housing: Low Risk  (01/28/2024)  Transportation Needs: No Transportation Needs (01/28/2024)  Utilities: Not At Risk (01/28/2024)  Depression (PHQ2-9): Low Risk  (03/23/2019)  Financial Resource Strain: Low Risk  (11/03/2022)   Received from [facility]  Tobacco Use: Medium Risk (01/28/2024)     Readmission Risk Interventions    02/02/2024    9:26 AM 08/08/2022   11:18 AM  Readmission Risk Prevention Plan  Transportation Screening Complete Complete  PCP or Specialist Appt within 3-5 Days Complete   HRI or Home Care  Consult Complete Complete  Social Work Consult for Recovery Care Planning/Counseling Complete Complete  Palliative Care Screening Not Applicable Not Applicable  Medication Review Oceanographer) Referral to Pharmacy Complete

## 2024-02-03 ENCOUNTER — Telehealth: Payer: Self-pay | Admitting: Nurse Practitioner

## 2024-02-03 NOTE — Telephone Encounter (Signed)
 Transition of Care - Initial Contact after Hospitalization  Date of discharge:  02/02/24 Date of contact: 02/03/24  Method: Phone Spoke to: Patient  Patient contacted to discuss transition of care from recent inpatient hospitalization. Patient was admitted to [facility] from 8/14 to 8/19 discharge diagnosis of acute volume overload and flash pulmonary edema  The discharge medication list was reviewed. Patient understands the changes and has no concerns.   Patient will return to his/her outpatient HD unit on: 02/03/24  No other concerns at this time.

## 2024-02-04 ENCOUNTER — Inpatient Hospital Stay (HOSPITAL_COMMUNITY)
Admission: EM | Admit: 2024-02-04 | Discharge: 2024-02-15 | DRG: 208 | Disposition: E | Source: Other Acute Inpatient Hospital | Attending: Internal Medicine | Admitting: Internal Medicine

## 2024-02-04 ENCOUNTER — Emergency Department (HOSPITAL_COMMUNITY)

## 2024-02-04 ENCOUNTER — Other Ambulatory Visit: Payer: Self-pay

## 2024-02-04 DIAGNOSIS — Z992 Dependence on renal dialysis: Secondary | ICD-10-CM

## 2024-02-04 DIAGNOSIS — R079 Chest pain, unspecified: Principal | ICD-10-CM

## 2024-02-04 DIAGNOSIS — I50811 Acute right heart failure: Secondary | ICD-10-CM | POA: Diagnosis present

## 2024-02-04 DIAGNOSIS — R0602 Shortness of breath: Secondary | ICD-10-CM

## 2024-02-04 DIAGNOSIS — I469 Cardiac arrest, cause unspecified: Secondary | ICD-10-CM | POA: Diagnosis present

## 2024-02-04 DIAGNOSIS — Z8701 Personal history of pneumonia (recurrent): Secondary | ICD-10-CM | POA: Diagnosis not present

## 2024-02-04 DIAGNOSIS — I953 Hypotension of hemodialysis: Secondary | ICD-10-CM | POA: Diagnosis present

## 2024-02-04 DIAGNOSIS — I132 Hypertensive heart and chronic kidney disease with heart failure and with stage 5 chronic kidney disease, or end stage renal disease: Secondary | ICD-10-CM | POA: Diagnosis present

## 2024-02-04 DIAGNOSIS — E872 Acidosis, unspecified: Secondary | ICD-10-CM | POA: Diagnosis present

## 2024-02-04 DIAGNOSIS — I2699 Other pulmonary embolism without acute cor pulmonale: Secondary | ICD-10-CM | POA: Diagnosis present

## 2024-02-04 DIAGNOSIS — N186 End stage renal disease: Secondary | ICD-10-CM | POA: Diagnosis present

## 2024-02-04 DIAGNOSIS — I2609 Other pulmonary embolism with acute cor pulmonale: Principal | ICD-10-CM | POA: Diagnosis present

## 2024-02-04 DIAGNOSIS — I959 Hypotension, unspecified: Secondary | ICD-10-CM

## 2024-02-04 LAB — I-STAT VENOUS BLOOD GAS, ED
Acid-base deficit: 7 mmol/L — ABNORMAL HIGH (ref 0.0–2.0)
Bicarbonate: 16.2 mmol/L — ABNORMAL LOW (ref 20.0–28.0)
Calcium, Ion: 0.94 mmol/L — ABNORMAL LOW (ref 1.15–1.40)
HCT: 26 % — ABNORMAL LOW (ref 36.0–46.0)
Hemoglobin: 8.8 g/dL — ABNORMAL LOW (ref 12.0–15.0)
O2 Saturation: 89 %
Potassium: 5.3 mmol/L — ABNORMAL HIGH (ref 3.5–5.1)
Sodium: 129 mmol/L — ABNORMAL LOW (ref 135–145)
TCO2: 17 mmol/L — ABNORMAL LOW (ref 22–32)
pCO2, Ven: 23.2 mmHg — ABNORMAL LOW (ref 44–60)
pH, Ven: 7.452 — ABNORMAL HIGH (ref 7.25–7.43)
pO2, Ven: 52 mmHg — ABNORMAL HIGH (ref 32–45)

## 2024-02-04 LAB — CBC WITH DIFFERENTIAL/PLATELET
Abs Immature Granulocytes: 0.06 K/uL (ref 0.00–0.07)
Basophils Absolute: 0 K/uL (ref 0.0–0.1)
Basophils Relative: 0 %
Eosinophils Absolute: 0 K/uL (ref 0.0–0.5)
Eosinophils Relative: 0 %
HCT: 26.4 % — ABNORMAL LOW (ref 36.0–46.0)
Hemoglobin: 8.5 g/dL — ABNORMAL LOW (ref 12.0–15.0)
Immature Granulocytes: 1 %
Lymphocytes Relative: 7 %
Lymphs Abs: 0.6 K/uL — ABNORMAL LOW (ref 0.7–4.0)
MCH: 33.9 pg (ref 26.0–34.0)
MCHC: 32.2 g/dL (ref 30.0–36.0)
MCV: 105.2 fL — ABNORMAL HIGH (ref 80.0–100.0)
Monocytes Absolute: 0.4 K/uL (ref 0.1–1.0)
Monocytes Relative: 6 %
Neutro Abs: 6.4 K/uL (ref 1.7–7.7)
Neutrophils Relative %: 86 %
Platelets: 110 K/uL — ABNORMAL LOW (ref 150–400)
RBC: 2.51 MIL/uL — ABNORMAL LOW (ref 3.87–5.11)
RDW: 20.2 % — ABNORMAL HIGH (ref 11.5–15.5)
WBC: 7.4 K/uL (ref 4.0–10.5)
nRBC: 3 % — ABNORMAL HIGH (ref 0.0–0.2)

## 2024-02-04 LAB — COMPREHENSIVE METABOLIC PANEL WITH GFR
ALT: 569 U/L — ABNORMAL HIGH (ref 0–44)
AST: 547 U/L — ABNORMAL HIGH (ref 15–41)
Albumin: 3.5 g/dL (ref 3.5–5.0)
Alkaline Phosphatase: 50 U/L (ref 38–126)
Anion gap: 26 — ABNORMAL HIGH (ref 5–15)
BUN: 80 mg/dL — ABNORMAL HIGH (ref 6–20)
CO2: 19 mmol/L — ABNORMAL LOW (ref 22–32)
Calcium: 8.2 mg/dL — ABNORMAL LOW (ref 8.9–10.3)
Chloride: 89 mmol/L — ABNORMAL LOW (ref 98–111)
Creatinine, Ser: 10.22 mg/dL — ABNORMAL HIGH (ref 0.44–1.00)
GFR, Estimated: 4 mL/min — ABNORMAL LOW (ref >60)
Glucose, Bld: 116 mg/dL — ABNORMAL HIGH (ref 70–99)
Potassium: 5.1 mmol/L (ref 3.5–5.1)
Sodium: 134 mmol/L — ABNORMAL LOW (ref 135–145)
Total Bilirubin: 1.4 mg/dL — ABNORMAL HIGH (ref 0.0–1.2)
Total Protein: 7.2 g/dL (ref 6.5–8.1)

## 2024-02-04 LAB — BASIC METABOLIC PANEL WITH GFR
Anion gap: 31 — ABNORMAL HIGH (ref 5–15)
BUN: 79 mg/dL — ABNORMAL HIGH (ref 6–20)
CO2: 18 mmol/L — ABNORMAL LOW (ref 22–32)
Calcium: 11 mg/dL — ABNORMAL HIGH (ref 8.9–10.3)
Chloride: 87 mmol/L — ABNORMAL LOW (ref 98–111)
Creatinine, Ser: 10.22 mg/dL — ABNORMAL HIGH (ref 0.44–1.00)
GFR, Estimated: 4 mL/min — ABNORMAL LOW (ref >60)
Glucose, Bld: 164 mg/dL — ABNORMAL HIGH (ref 70–99)
Potassium: 6.9 mmol/L (ref 3.5–5.1)
Sodium: 136 mmol/L (ref 135–145)

## 2024-02-04 LAB — TROPONIN I (HIGH SENSITIVITY)
Troponin I (High Sensitivity): 129 ng/L (ref <18)
Troponin I (High Sensitivity): 166 ng/L (ref <18)

## 2024-02-04 MED ORDER — TENECTEPLASE 50 MG IV KIT
30.0000 mg | PACK | INTRAVENOUS | Status: AC
  Administered 2024-02-04: 30 mg via INTRAVENOUS
  Filled 2024-02-04: qty 10

## 2024-02-04 MED ORDER — EPINEPHRINE HCL 5 MG/250ML IV SOLN IN NS
0.5000 ug/min | INTRAVENOUS | Status: DC
  Administered 2024-02-04: 5 ug/min via INTRAVENOUS

## 2024-02-04 MED ORDER — EPINEPHRINE 1 MG/10ML IJ SOSY
PREFILLED_SYRINGE | INTRAMUSCULAR | Status: AC | PRN
  Administered 2024-02-04: 1 mg via INTRAVENOUS

## 2024-02-04 MED ORDER — ETOMIDATE 2 MG/ML IV SOLN
INTRAVENOUS | Status: AC | PRN
  Administered 2024-02-04: 20 mg via INTRAVENOUS

## 2024-02-04 MED ORDER — NOREPINEPHRINE 4 MG/250ML-% IV SOLN
0.0000 ug/min | INTRAVENOUS | Status: DC
  Administered 2024-02-04: 2 ug/min via INTRAVENOUS

## 2024-02-04 MED ORDER — EPINEPHRINE 0.1 MG/10ML (10 MCG/ML) SYRINGE FOR IV PUSH (FOR BLOOD PRESSURE SUPPORT)
PREFILLED_SYRINGE | INTRAVENOUS | Status: AC | PRN
  Administered 2024-02-04 (×2): 100 ug via INTRAVENOUS

## 2024-02-04 MED ORDER — POLYETHYLENE GLYCOL 3350 17 G PO PACK: 17.0000 g | PACK | Freq: Every day | ORAL | Status: DC | PRN

## 2024-02-04 MED ORDER — FAMOTIDINE 20 MG PO TABS: 20.0000 mg | ORAL_TABLET | Freq: Every day | ORAL | Status: DC

## 2024-02-04 MED ORDER — SODIUM BICARBONATE 8.4 % IV SOLN
INTRAVENOUS | Status: AC | PRN
  Administered 2024-02-04: 50 meq via INTRAVENOUS

## 2024-02-04 MED ORDER — EPINEPHRINE 1 MG/10ML IJ SOSY
PREFILLED_SYRINGE | INTRAMUSCULAR | Status: AC | PRN
  Administered 2024-02-04 (×3): 1 mg via INTRAVENOUS

## 2024-02-04 MED ORDER — SODIUM CHLORIDE 0.9 % IV SOLN: INTRAVENOUS | Status: DC

## 2024-02-04 MED ORDER — ASPIRIN 81 MG PO CHEW: 81.0000 mg | CHEWABLE_TABLET | Freq: Once | ORAL | Status: DC

## 2024-02-04 MED ORDER — DOCUSATE SODIUM 100 MG PO CAPS: 100.0000 mg | ORAL_CAPSULE | Freq: Two times a day (BID) | ORAL | Status: DC | PRN

## 2024-02-04 MED ORDER — FENTANYL CITRATE PF 50 MCG/ML IJ SOSY: 50.0000 ug | PREFILLED_SYRINGE | INTRAMUSCULAR | Status: DC | PRN

## 2024-02-04 MED ORDER — EPINEPHRINE 1 MG/10ML IJ SOSY
PREFILLED_SYRINGE | INTRAMUSCULAR | Status: AC
  Filled 2024-02-04: qty 30

## 2024-02-04 MED ORDER — AMIODARONE HCL IN DEXTROSE 360-4.14 MG/200ML-% IV SOLN
INTRAVENOUS | Status: AC
  Filled 2024-02-04: qty 200

## 2024-02-04 MED ORDER — CALCIUM CHLORIDE 10 % IV SOLN
INTRAVENOUS | Status: AC | PRN
  Administered 2024-02-04: 1 g via INTRAVENOUS

## 2024-02-04 MED ORDER — ROCURONIUM BROMIDE 10 MG/ML (PF) SYRINGE
PREFILLED_SYRINGE | INTRAVENOUS | Status: AC | PRN
  Administered 2024-02-04: 80 mg via INTRAVENOUS

## 2024-02-04 MED ORDER — MIDAZOLAM HCL 2 MG/2ML IJ SOLN: 1.0000 mg | INTRAMUSCULAR | Status: DC | PRN

## 2024-02-04 MED ORDER — CHLORHEXIDINE GLUCONATE CLOTH 2 % EX PADS: 6.0000 | MEDICATED_PAD | Freq: Every day | CUTANEOUS | Status: DC

## 2024-02-08 ENCOUNTER — Other Ambulatory Visit

## 2024-02-08 ENCOUNTER — Inpatient Hospital Stay: Admitting: Hematology

## 2024-02-09 ENCOUNTER — Telehealth: Payer: Self-pay | Admitting: Physician Assistant

## 2024-02-09 ENCOUNTER — Ambulatory Visit: Admitting: Physician Assistant

## 2024-02-09 ENCOUNTER — Inpatient Hospital Stay: Admitting: Hematology

## 2024-02-09 ENCOUNTER — Other Ambulatory Visit

## 2024-02-09 NOTE — Telephone Encounter (Signed)
 Pt passed on 2024/02/28 and Mr. Madeline Brown is requesting a callback on his cell at [phone] regarding a signature for death certificate. Please advise

## 2024-02-09 NOTE — Telephone Encounter (Signed)
 Spoke with Mr Kennyth from the funeral home.  He was instructed to call here to have pt's death certification signed.  She was last seen here 01/06/24 by Jon Hails, PA.  Will forward to her for review.

## 2024-02-10 NOTE — Telephone Encounter (Signed)
 Please reach out to primary or nephrology MD. She was hospitalized three times since I last saw her without a cardiology consult.   Thank you Angie

## 2024-02-10 NOTE — Telephone Encounter (Signed)
 Routing message to Jon Hails NP.

## 2024-02-10 NOTE — Telephone Encounter (Signed)
 Received a call back from [facility]. Pts PCP is going to call Mr. Kennyth a call from the funeral home to arrange signature needed.

## 2024-02-10 NOTE — Telephone Encounter (Signed)
 Spoke with [facility] and notified them of signature needed for death certificate. I was told by Antonio, that someone would contact our office.

## 2024-02-12 ENCOUNTER — Ambulatory Visit (HOSPITAL_COMMUNITY)

## 2024-02-15 NOTE — ED Notes (Signed)
 Pulse check, femoral pulse palpated, code stop

## 2024-02-15 NOTE — ED Notes (Signed)
 Sharyne, MD to obtain second troponin with US 

## 2024-02-15 NOTE — ED Notes (Addendum)
 Jolee NT, Mitzie RAMAN RN, Ole Rouleau RN, Sharyne DEL RN, Issac RN, Long MD, Carpenter MD at bedside, no pulses. Code started.

## 2024-02-15 NOTE — ED Notes (Signed)
 Pulse check, femoral pulses felt, code stopped.

## 2024-02-15 NOTE — ED Provider Notes (Cosign Needed Addendum)
 [place] EMERGENCY DEPARTMENT AT [facility] Provider Note  MDM   HPI/ROS:  Madeline Brown is a 48 y.o. female with a medical history as below who presents with acute onset shortness of breath, lightheadedness and substernal nonradiating nonexertional chest pain.  Patient reports she was at dialysis today when she started feeling dizzy shortly after short of breath with chest pain.  They found her blood pressure to be 60 systolic which prompted them to call EMS.  She states her symptoms are resolved after oxygen administration.  States she feels at her normal state of health at this time.  She denies any recent infectious symptoms but does endorse recent admission to the hospital for fluid overload.  Physical exam is notable for: - Generally well-appearing, no creased work of breathing or supplemental O2 requirement.  Initially on 15 L however now satting well on room air.  No lower extremity edema or rales  On my initial evaluation, patient is:  -Vital signs stable. Patient afebrile, hemodynamically stable, and non-toxic appearing. -Additional history obtained from EMS  Differentials include ACS, CHF, pneumonia, PE, dissection, fluid shift, electrolyte abnormality.    Exam is reassuring as patient no longer has any supplemental O2 requirement.  She does not have any increased work of breathing and her chest pain has resolved.  Given her risk factors I do believe it appropriate to obtain ACS workup including basic labs to ensure no need for further emergent dialysis.  She has no signs or symptoms of fluid overload at this time.  Initial labs without gross electrolyte abnormalities.  She is not tachycardic and has no asymmetric lower extremity swelling.  While in the emergency department she did have a sudden cardiac arrest.  ACLS was performed with ROSC after initial dose of epinephrine , bicarb and calcium .  She was intubated at this point with persistent hypotension, Levophed  was  initiated.  Despite initial ROSC she did have multiple episodes refractory arrest with frequent ROSC.  Repeat electrolytes and blood gas were drawn.  She continued to have persistent cardiac arrest.  Low-dose epinephrine  initiated.  She continued to have persistent ectopy, considered amiodarone  however cardiac arrest was too frequent and patient had no captured shockable rhythms.  She does have some persistent ectopy.  She received multiple doses of bicarb and calcium  given incomplete dialysis session today.  Bedside echo with dilated RV, thrombolytics were given drastic attempt of life saving measures.  Believe massive PE may be the ultimate cause at this point given significant right heart strain and almost complete collapse of LV.  Continued ACLS in attempt to get time for thrombolytics to work however despite our best efforts patient was ultimately pronounced deceased at 12:59pm on 02/24/24.  Family was at bedside and notified.  Did reach out to medical examiner who deferred this is an ME case.    Interpretations, interventions, and the patient's course of care are documented below.    Clinical Course as of 2024/02/24 1443  Thu 24-Feb-2024  0842 ED EKG NSR with normal axis, borderline PR with prolonged QTc, sub-1 mm STE in aVR, V1, poor R wave progression.  No peaked T waves.  Similar to prior [RC]  1029 Hemoglobin(!): 8.5 Baseline [RC]  1142 Potassium: 5.1 [RC]  1142 BUN(!): 80 [RC]  1142 CO2(!): 19 Anion gap acidosis likely uremia [RC]  1142 Troponin I (High Sensitivity)(!!): 129 [RC]  1143 Total Bilirubin(!): 1.4 [RC]  1143 CBC without anemia [RC]  1436 DG Chest Portable 1 View ET  tube in good position [RC]    Clinical Course User Index [RC] Sharyne Darina RAMAN, MD      Disposition: Deceased  Clinical Impression:  1. Chest pain, unspecified type   2. Shortness of breath   3. Hypotension, unspecified hypotension type   4. Acute right-sided heart failure (HCC)   5. Acute massive  pulmonary embolism (HCC)     The plan for this patient was discussed with Dr. Darra, who voiced agreement and who oversaw evaluation and treatment of this patient.   Clinical Complexity A medically appropriate history, review of systems, and physical exam was performed.  My independent interpretations of EKG, labs, and radiology are documented in the ED course above.   If decision rules were used in this patient's evaluation, they are listed below.   Click here for ABCD2, HEART and other calculatorsREFRESH Note before signing   Patient's presentation is most consistent with acute presentation with potential threat to life or bodily function.  Medical Decision Making Amount and/or Complexity of Data Reviewed Labs: ordered. Decision-making details documented in ED Course. Radiology: ordered. Decision-making details documented in ED Course. ECG/medicine tests:  Decision-making details documented in ED Course.  Risk OTC drugs. Prescription drug management.    HPI/ROS      See MDM section for pertinent HPI and ROS. A complete ROS was performed with pertinent positives/negatives noted above.   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   History of blood transfusion 07/2020   Hypertension    Hypertensive chronic kidney disease with stage 1 through stage 4 chronic kidney disease, or unspecified chronic kidney disease 06/30/2017   Intracerebral hemorrhage 02/21/2019   Intracranial hematoma (HCC) 02/20/2019   Intracranial hemorrhage (HCC)    LVH (left ventricular hypertrophy)    Pericardial effusion    Pneumonia    Pulmonary hypertension (HCC)    Seizure (HCC) 02/2019   last one was in 02/2019   Shortness of breath 07/15/2018   on 3L via [place] prn   Stroke (HCC)    Mild was told after having a seizure   Substance abuse (HCC)    Marijuana   Thrombocytopenia (HCC)     Past Surgical History:   Procedure Laterality Date   AV FISTULA PLACEMENT Left 06/24/2017   Procedure: CREATION OF LEFT ARM BRACHIALCEPHALIC  ARTERIOVENOUS (AV) FISTULA;  Surgeon: Oris Krystal FALCON, MD;  Location: [facility] OR;  Service: Vascular;  Laterality: Left;   AV FISTULA PLACEMENT Right 10/20/2022   Procedure: INSERTION OF RIGHT ARM ARTERIOVENOUS (AV) GORE-TEX GRAFT;  Surgeon: Magda Debby SAILOR, MD;  Location: [facility] OR;  Service: Vascular;  Laterality: Right;   CARDIAC CATHETERIZATION     CESAREAN SECTION     x2   COMPLEX WOUND CLOSURE Left 07/30/2020   Procedure: COMPLEX WOUND CLOSURE;  Surgeon: Magda Debby SAILOR, MD;  Location: [facility] OR;  Service: Vascular;  Laterality: Left;   FISTULA SUPERFICIALIZATION Left 10/28/2017   Procedure: SUPERFICIALIZATION LEFT BRACHIOCEPHALIC ARTERIOVENOUS FISTULA;  Surgeon: Oris Krystal FALCON, MD;  Location: [facility] OR;  Service: Vascular;  Laterality: Left;   INGUINAL HERNIA REPAIR Bilateral 02/01/2021   Procedure: LAPAROSCOPIC BILATERAL INGUINAL HERNIA REPAIR WITH MESH;  Surgeon: Kinsinger, Herlene Righter, MD;  Location: [facility] ORS;  Service: General;  Laterality: Bilateral;   INSERTION OF DIALYSIS CATHETER Right 06/24/2017   Procedure: INSERTION OF TUNNELED DIALYSIS CATHETER;  Surgeon: Oris Krystal FALCON, MD;  Location: [facility] OR;  Service: Vascular;  Laterality: Right;   INSERTION OF DIALYSIS CATHETER Right 07/30/2020   Procedure: INSERTION OF DIALYSIS CATHETER;  Surgeon: Magda Debby SAILOR, MD;  Location: [facility] OR;  Service: Vascular;  Laterality: Right;   INSERTION OF DIALYSIS CATHETER Right 08/06/2022   Procedure: INSERTION OF TUNNELED DIALYSIS CATHETER;  Surgeon: Magda Debby SAILOR, MD;  Location: [facility];  Service: Vascular;  Laterality: Right;   LIGATION OF COMPETING BRANCHES OF ARTERIOVENOUS FISTULA  10/28/2017   Procedure: LIGATION OF COMPETING BRANCHES OF ARTERIOVENOUS FISTULA x3;  Surgeon: Oris Krystal FALCON, MD;  Location: [facility];  Service: Vascular;;   REVISON OF ARTERIOVENOUS FISTULA Left 07/30/2020   Procedure: LEFT  UPPER EXTREMITY ARTERIOVENOUS FISTULA REVISON;  Surgeon: Magda Debby SAILOR, MD;  Location: [facility] OR;  Service: Vascular;  Laterality: Left;  PERIPHERAL NERVE BLOCK   REVISON OF ARTERIOVENOUS FISTULA Left 08/06/2022   Procedure: LEFT UPPER EXTREMITY FISTULA LIGATION;  Surgeon: Magda Debby SAILOR, MD;  Location: [facility] OR;  Service: Vascular;  Laterality: Left;   RIGHT HEART CATH N/A 07/09/2022   Procedure: RIGHT HEART CATH;  Surgeon: Gardenia Led, DO;  Location: [facility] INVASIVE CV [facility];  Service: Cardiovascular;  Laterality: N/A;      Physical Exam   Vitals:   02-25-2024 1252 25-Feb-2024 1252 02/25/2024 1255 Feb 25, 2024 1257  BP: (!) 37/18 (!) 68/49 (!) 85/34 (!) 144/92  Pulse:   70   Resp:   17   Temp:      TempSrc:      SpO2:      Weight:      Height:        Physical Exam Vitals and nursing note reviewed.  Constitutional:      General: She is not in acute distress.    Appearance: She is well-developed.  HENT:     Head: Normocephalic and atraumatic.  Eyes:     Conjunctiva/sclera: Conjunctivae normal.  Cardiovascular:     Rate and Rhythm: Normal rate and regular rhythm.     Heart sounds: No murmur heard.    Comments: Palpable thrill in the right upper extremity.  Fistula without thrill left upper extremity Pulmonary:     Effort: Pulmonary effort is normal. No tachypnea, accessory muscle usage or respiratory distress.     Breath sounds: Normal breath sounds. No decreased air movement. No rales.  Abdominal:     Palpations: Abdomen is soft.     Tenderness: There is no abdominal tenderness.  Musculoskeletal:        General: No swelling.     Cervical back: Neck supple.     Right lower leg: No edema.     Left lower leg: No edema.  Skin:    General: Skin is warm and dry.     Capillary Refill: Capillary refill takes less than 2 seconds.  Neurological:     Mental Status: She is alert.  Psychiatric:        Mood and Affect: Mood normal.      Procedures   If procedures were preformed on this  patient, they are listed below:  Procedure Name: Intubation Date/Time: Feb 25, 2024 2:42 PM  Performed by: Sharyne Darina RAMAN, MDPre-anesthesia Checklist: Emergency Drugs available Oxygen Delivery Method: Non-rebreather mask Preoxygenation: Pre-oxygenation with 100% oxygen Induction Type: Rapid sequence Ventilation: Mask ventilation without difficulty Laryngoscope Size: Mac and 3 Grade View: Grade I Tube size: 7.5 mm Number of attempts: 1 Airway Equipment and Method: Video-laryngoscopy and Rigid stylet Placement Confirmation: ETT inserted through vocal cords  under direct vision, Positive ETCO2, Breath sounds checked- equal and bilateral and CO2 detector Secured at: 23 cm Tube secured with: ETT holder Dental Injury: Teeth and Oropharynx as per pre-operative assessment        @BBSIG @   Please note that this documentation was produced with the assistance of voice-to-text technology and may contain errors.    Sharyne Darina RAMAN, MD 2024-03-03 1440    Sharyne Darina RAMAN, MD 03/03/24 1443    Darra Fonda MATSU, MD 02/05/24 [phone]

## 2024-02-15 NOTE — ED Triage Notes (Addendum)
 Pt BIB [facility] from dialysis center with c/o of chest pain and hypotension. Unknown fluid output, in chair for 1 hour. 60/40 BP at dialysis, 70/50 with EMS initially. Chest pain and and epigastric pain 10/10, resolved on arrival. Asprin 324 given by EMS. Took 2 prescribed midodrine . Fistula on R arm clamped by dialysis, clamp in place at this time   VS 86% 2L, 100% NRB, 122/78 BP

## 2024-02-15 NOTE — ED Notes (Signed)
 Sharyne, MD notified that pt was requesting fistula clamp to be removed. Clamp placed at dialysis prior to pt arrival. Sharyne, MD to come to bedside.

## 2024-02-15 NOTE — ED Notes (Addendum)
 This RN attempted to straight stick for troponin and was unsuccessful. Sharyne, MD notified

## 2024-02-15 NOTE — Progress Notes (Signed)
 There was consult for a PIV access. Rt. Arm was restricted due to fistula and old fistula was on Lt. Upper arm. There was no suitable vein for placing PIV access only one vein on posterior lower arm. When put in the needle, vein was shrinking. Attempted to twice without success. Informed patient's RN regarding this matter. HS [employer]

## 2024-02-15 NOTE — ED Notes (Signed)
 Pulse check, no pulses, CPR reinitiated

## 2024-02-15 NOTE — ED Notes (Signed)
 Strong femoral pulse felt by carpenter MD, code end

## 2024-02-15 NOTE — ED Notes (Signed)
 Ole RN at bedside, pulses back

## 2024-02-15 NOTE — ED Notes (Signed)
 1 yellow looking necklace, 5 yellow looking bracelets and 3 yellow looking earrings removed from pt. Placed in small bag in pts personal bag at familys bedside.

## 2024-02-15 NOTE — ED Notes (Addendum)
 Family at bedside.

## 2024-02-15 NOTE — ED Notes (Signed)
 Pulse check, femoral pulse felt, code stopped.

## 2024-02-15 NOTE — Progress Notes (Signed)
 Pt. Passed. Chaplain provided grief support to husband at bedside.   .my

## 2024-02-15 NOTE — ED Notes (Signed)
 IV team at bedside

## 2024-02-15 DEATH — deceased

## 2024-02-17 ENCOUNTER — Ambulatory Visit: Admitting: Emergency Medicine
